# Patient Record
Sex: Female | Born: 1935 | Race: White | Hispanic: No | Marital: Married | Smoking: Former smoker
Health system: Southern US, Community
[De-identification: ages and names within clinical notes are randomized; demographics above are authoritative.]

## PROBLEM LIST (undated history)

## (undated) DIAGNOSIS — E079 Disorder of thyroid, unspecified: Secondary | ICD-10-CM

## (undated) DIAGNOSIS — M1990 Unspecified osteoarthritis, unspecified site: Secondary | ICD-10-CM

## (undated) DIAGNOSIS — D61818 Other pancytopenia: Secondary | ICD-10-CM

## (undated) DIAGNOSIS — C8590 Non-Hodgkin lymphoma, unspecified, unspecified site: Secondary | ICD-10-CM

## (undated) DIAGNOSIS — C73 Malignant neoplasm of thyroid gland: Secondary | ICD-10-CM

## (undated) DIAGNOSIS — E890 Postprocedural hypothyroidism: Secondary | ICD-10-CM

## (undated) DIAGNOSIS — I4891 Unspecified atrial fibrillation: Secondary | ICD-10-CM

## (undated) DIAGNOSIS — C189 Malignant neoplasm of colon, unspecified: Secondary | ICD-10-CM

## (undated) DIAGNOSIS — I10 Essential (primary) hypertension: Secondary | ICD-10-CM

## (undated) DIAGNOSIS — I609 Nontraumatic subarachnoid hemorrhage, unspecified: Secondary | ICD-10-CM

## (undated) DIAGNOSIS — I2699 Other pulmonary embolism without acute cor pulmonale: Secondary | ICD-10-CM

## (undated) DIAGNOSIS — K5790 Diverticulosis of intestine, part unspecified, without perforation or abscess without bleeding: Secondary | ICD-10-CM

## (undated) DIAGNOSIS — D649 Anemia, unspecified: Secondary | ICD-10-CM

## (undated) DIAGNOSIS — Z8679 Personal history of other diseases of the circulatory system: Secondary | ICD-10-CM

## (undated) DIAGNOSIS — I480 Paroxysmal atrial fibrillation: Secondary | ICD-10-CM

## (undated) DIAGNOSIS — C801 Malignant (primary) neoplasm, unspecified: Secondary | ICD-10-CM

## (undated) HISTORY — DX: Essential (primary) hypertension: I10

## (undated) HISTORY — DX: Disorder of thyroid, unspecified: E07.9

## (undated) HISTORY — DX: Postprocedural hypothyroidism: E89.0

## (undated) HISTORY — DX: Non-Hodgkin lymphoma, unspecified, unspecified site: C85.90

## (undated) HISTORY — DX: Nontraumatic subarachnoid hemorrhage, unspecified: I60.9

## (undated) HISTORY — DX: Paroxysmal atrial fibrillation: I48.0

## (undated) HISTORY — DX: Unspecified osteoarthritis, unspecified site: M19.90

## (undated) HISTORY — DX: Anemia, unspecified: D64.9

## (undated) HISTORY — DX: Malignant neoplasm of thyroid gland: C73

## (undated) HISTORY — PX: SUBDURAL HEMATOMA EVACUATION VIA CRANIOTOMY: SUR319

## (undated) HISTORY — DX: Diverticulosis of intestine, part unspecified, without perforation or abscess without bleeding: K57.90

## (undated) HISTORY — DX: Malignant (primary) neoplasm, unspecified: C80.1

## (undated) HISTORY — PX: TUBOPLASTY / TUBOTUBAL ANASTOMOSIS: SUR1392

---

## 1957-12-20 HISTORY — PX: APPENDECTOMY: SHX54

## 1994-12-20 DIAGNOSIS — I609 Nontraumatic subarachnoid hemorrhage, unspecified: Secondary | ICD-10-CM

## 1994-12-20 HISTORY — DX: Nontraumatic subarachnoid hemorrhage, unspecified: I60.9

## 1996-12-20 HISTORY — PX: KNEE ARTHROSCOPY: SUR90

## 1996-12-20 HISTORY — PX: ROTATOR CUFF REPAIR: SHX139

## 1997-12-20 HISTORY — PX: CHOLECYSTECTOMY: SHX55

## 2011-09-08 ENCOUNTER — Encounter: Payer: Self-pay | Admitting: Hematology and Oncology

## 2011-09-08 ENCOUNTER — Other Ambulatory Visit: Payer: Self-pay | Admitting: Hematology and Oncology

## 2011-09-08 ENCOUNTER — Encounter (HOSPITAL_BASED_OUTPATIENT_CLINIC_OR_DEPARTMENT_OTHER): Payer: Medicare Other | Admitting: Hematology and Oncology

## 2011-09-08 ENCOUNTER — Other Ambulatory Visit (HOSPITAL_COMMUNITY)
Admission: RE | Admit: 2011-09-08 | Discharge: 2011-09-08 | Disposition: A | Discharge status: Home / Self Care | Payer: Medicare Other | Source: Ambulatory Visit | Attending: Hematology and Oncology | Admitting: Hematology and Oncology

## 2011-09-08 DIAGNOSIS — C9110 Chronic lymphocytic leukemia of B-cell type not having achieved remission: Secondary | ICD-10-CM

## 2011-09-08 DIAGNOSIS — D7289 Other specified disorders of white blood cells: Secondary | ICD-10-CM

## 2011-09-08 DIAGNOSIS — D72819 Decreased white blood cell count, unspecified: Secondary | ICD-10-CM

## 2011-09-08 DIAGNOSIS — D689 Coagulation defect, unspecified: Secondary | ICD-10-CM

## 2011-09-08 LAB — CBC WITH DIFFERENTIAL/PLATELET
MCH: 32.4 pg (ref 25.1–34.0)
MCHC: 33.5 g/dL (ref 31.5–36.0)
RBC: 4.47 10*6/uL (ref 3.70–5.45)
RDW: 13.8 % (ref 11.2–14.5)

## 2011-09-08 LAB — MANUAL DIFFERENTIAL
ANC (CHCC manual diff): 5.2 10*3/uL (ref 1.5–6.5)
Basophil: 0 % (ref 0–2)
Blasts: 0 % (ref 0–0)
Metamyelocytes: 0 % (ref 0–0)
PLT EST: DECREASED
PROMYELO: 0 % (ref 0–0)

## 2011-09-08 LAB — IVY BLEEDING TIME: Bleeding Time: 6.5 Minutes (ref 2.0–8.0)

## 2011-09-08 LAB — PROTIME-INR
INR: 1 — ABNORMAL LOW (ref 2.00–3.50)
Protime: 12 Seconds (ref 10.6–13.4)

## 2011-09-08 LAB — LACTATE DEHYDROGENASE: LDH: 309 U/L — ABNORMAL HIGH (ref 94–250)

## 2011-09-10 LAB — PROTEIN ELECTROPHORESIS, SERUM, WITH REFLEX
Alpha-2-Globulin: 12 % — ABNORMAL HIGH (ref 7.1–11.8)
Beta 2: 4.8 % (ref 3.2–6.5)
Beta Globulin: 5.9 % (ref 4.7–7.2)
Total Protein, Serum Electrophoresis: 6.8 g/dL (ref 6.0–8.3)

## 2011-09-10 LAB — IGG, IGA, IGM
IgA: 42 mg/dL — ABNORMAL LOW (ref 69–380)
IgG (Immunoglobin G), Serum: 658 mg/dL — ABNORMAL LOW (ref 690–1700)
IgM, Serum: 14 mg/dL — ABNORMAL LOW (ref 52–322)

## 2011-09-10 LAB — APTT: aPTT: 32 seconds (ref 24–37)

## 2011-09-24 ENCOUNTER — Other Ambulatory Visit: Payer: Self-pay | Admitting: Hematology and Oncology

## 2011-09-24 ENCOUNTER — Encounter (HOSPITAL_BASED_OUTPATIENT_CLINIC_OR_DEPARTMENT_OTHER): Payer: Medicare Other | Admitting: Hematology and Oncology

## 2011-09-24 ENCOUNTER — Ambulatory Visit (HOSPITAL_COMMUNITY)
Admission: RE | Admit: 2011-09-24 | Discharge: 2011-09-24 | Disposition: A | Discharge status: Home / Self Care | Payer: Medicare Other | Source: Ambulatory Visit | Attending: Hematology and Oncology | Admitting: Hematology and Oncology

## 2011-09-24 DIAGNOSIS — Z9089 Acquired absence of other organs: Secondary | ICD-10-CM | POA: Insufficient documentation

## 2011-09-24 DIAGNOSIS — R599 Enlarged lymph nodes, unspecified: Secondary | ICD-10-CM | POA: Insufficient documentation

## 2011-09-24 DIAGNOSIS — K7689 Other specified diseases of liver: Secondary | ICD-10-CM | POA: Insufficient documentation

## 2011-09-24 DIAGNOSIS — K838 Other specified diseases of biliary tract: Secondary | ICD-10-CM | POA: Insufficient documentation

## 2011-09-24 DIAGNOSIS — C9110 Chronic lymphocytic leukemia of B-cell type not having achieved remission: Secondary | ICD-10-CM

## 2011-09-24 DIAGNOSIS — K5730 Diverticulosis of large intestine without perforation or abscess without bleeding: Secondary | ICD-10-CM | POA: Insufficient documentation

## 2011-09-24 DIAGNOSIS — R109 Unspecified abdominal pain: Secondary | ICD-10-CM | POA: Insufficient documentation

## 2011-09-24 DIAGNOSIS — R197 Diarrhea, unspecified: Secondary | ICD-10-CM | POA: Insufficient documentation

## 2011-09-24 DIAGNOSIS — M47814 Spondylosis without myelopathy or radiculopathy, thoracic region: Secondary | ICD-10-CM | POA: Insufficient documentation

## 2011-09-24 DIAGNOSIS — R161 Splenomegaly, not elsewhere classified: Secondary | ICD-10-CM | POA: Insufficient documentation

## 2011-09-24 DIAGNOSIS — E042 Nontoxic multinodular goiter: Secondary | ICD-10-CM | POA: Insufficient documentation

## 2011-09-24 DIAGNOSIS — M5030 Other cervical disc degeneration, unspecified cervical region: Secondary | ICD-10-CM | POA: Insufficient documentation

## 2011-09-24 DIAGNOSIS — K639 Disease of intestine, unspecified: Secondary | ICD-10-CM | POA: Insufficient documentation

## 2011-09-24 DIAGNOSIS — R911 Solitary pulmonary nodule: Secondary | ICD-10-CM | POA: Insufficient documentation

## 2011-09-24 DIAGNOSIS — M47812 Spondylosis without myelopathy or radiculopathy, cervical region: Secondary | ICD-10-CM | POA: Insufficient documentation

## 2011-09-24 LAB — CMP (CANCER CENTER ONLY)
Alkaline Phosphatase: 115 U/L — ABNORMAL HIGH (ref 26–84)
BUN, Bld: 21 mg/dL (ref 7–22)
Creat: 1 mg/dl (ref 0.6–1.2)
Glucose, Bld: 103 mg/dL (ref 73–118)
Total Bilirubin: 0.9 mg/dl (ref 0.20–1.60)

## 2011-09-24 MED ORDER — IOHEXOL 300 MG/ML  SOLN
150.0000 mL | Freq: Once | INTRAMUSCULAR | Status: AC | PRN
  Administered 2011-09-24: 150 mL via INTRAVENOUS

## 2011-09-28 ENCOUNTER — Encounter (HOSPITAL_BASED_OUTPATIENT_CLINIC_OR_DEPARTMENT_OTHER): Payer: Medicare Other | Admitting: Hematology and Oncology

## 2011-09-28 DIAGNOSIS — K639 Disease of intestine, unspecified: Secondary | ICD-10-CM

## 2011-09-28 DIAGNOSIS — D809 Immunodeficiency with predominantly antibody defects, unspecified: Secondary | ICD-10-CM

## 2011-09-28 DIAGNOSIS — E041 Nontoxic single thyroid nodule: Secondary | ICD-10-CM

## 2011-09-28 DIAGNOSIS — C8589 Other specified types of non-Hodgkin lymphoma, extranodal and solid organ sites: Secondary | ICD-10-CM

## 2011-09-30 ENCOUNTER — Other Ambulatory Visit: Payer: Self-pay | Admitting: Hematology and Oncology

## 2011-09-30 DIAGNOSIS — C8590 Non-Hodgkin lymphoma, unspecified, unspecified site: Secondary | ICD-10-CM

## 2011-10-05 HISTORY — PX: COLONOSCOPY: SHX174

## 2011-10-06 ENCOUNTER — Ambulatory Visit (HOSPITAL_COMMUNITY): Payer: Medicare Other

## 2011-10-06 ENCOUNTER — Other Ambulatory Visit: Payer: Self-pay | Admitting: Interventional Radiology

## 2011-10-06 ENCOUNTER — Other Ambulatory Visit: Payer: Self-pay | Admitting: Hematology and Oncology

## 2011-10-06 ENCOUNTER — Ambulatory Visit (HOSPITAL_COMMUNITY)
Admission: RE | Admit: 2011-10-06 | Discharge: 2011-10-06 | Disposition: A | Discharge status: Home / Self Care | Payer: Medicare Other | Source: Ambulatory Visit | Attending: Hematology and Oncology | Admitting: Hematology and Oncology

## 2011-10-06 DIAGNOSIS — C8589 Other specified types of non-Hodgkin lymphoma, extranodal and solid organ sites: Secondary | ICD-10-CM | POA: Insufficient documentation

## 2011-10-06 DIAGNOSIS — C8590 Non-Hodgkin lymphoma, unspecified, unspecified site: Secondary | ICD-10-CM

## 2011-10-06 LAB — CBC
HCT: 39.1 % (ref 36.0–46.0)
MCHC: 32.5 g/dL (ref 30.0–36.0)
MCV: 96.1 fL (ref 78.0–100.0)
RDW: 13.7 % (ref 11.5–15.5)

## 2011-10-06 LAB — APTT: aPTT: 32 seconds (ref 24–37)

## 2011-10-21 HISTORY — PX: COLON RESECTION: SHX5231

## 2011-11-04 ENCOUNTER — Other Ambulatory Visit: Payer: Self-pay | Admitting: *Deleted

## 2011-11-04 ENCOUNTER — Telehealth: Payer: Self-pay | Admitting: *Deleted

## 2011-11-04 ENCOUNTER — Telehealth: Payer: Self-pay | Admitting: Hematology and Oncology

## 2011-11-04 DIAGNOSIS — C9110 Chronic lymphocytic leukemia of B-cell type not having achieved remission: Secondary | ICD-10-CM

## 2011-11-04 DIAGNOSIS — C8590 Non-Hodgkin lymphoma, unspecified, unspecified site: Secondary | ICD-10-CM

## 2011-11-04 NOTE — Telephone Encounter (Signed)
Spoke with pt today and was informed that pt would like to talk to md to discuss any recommendations by md.   Pt stated she has had unrelievable itching from the whole torso -  Especially in back and bilateral axillary areas -   Happened about 2 weeks before surgery.    Pt  Did not ask her primary about the itching.    Pt thinks  The symptom related to  Her lymphoma.     Pt would like to talk to md about the itching.    Pt stated she would make decisions  After discussing with md. Pt's   Phone     [phone].

## 2011-11-04 NOTE — Telephone Encounter (Signed)
S/w pt re appt for 2/5 @ 2 pm.

## 2011-11-20 ENCOUNTER — Encounter: Payer: Self-pay | Admitting: *Deleted

## 2011-11-20 DIAGNOSIS — I2699 Other pulmonary embolism without acute cor pulmonale: Secondary | ICD-10-CM

## 2011-11-20 HISTORY — DX: Other pulmonary embolism without acute cor pulmonale: I26.99

## 2011-11-24 ENCOUNTER — Ambulatory Visit (HOSPITAL_BASED_OUTPATIENT_CLINIC_OR_DEPARTMENT_OTHER): Payer: Medicare Other | Admitting: Hematology and Oncology

## 2011-11-24 ENCOUNTER — Encounter (HOSPITAL_COMMUNITY): Payer: Self-pay | Admitting: Emergency Medicine

## 2011-11-24 ENCOUNTER — Inpatient Hospital Stay (HOSPITAL_COMMUNITY)
Admission: EM | Admit: 2011-11-24 | Discharge: 2011-11-26 | DRG: 176 | Disposition: A | Discharge status: Home / Self Care | Payer: Medicare Other | Attending: Internal Medicine | Admitting: Internal Medicine

## 2011-11-24 ENCOUNTER — Other Ambulatory Visit: Payer: Self-pay | Admitting: *Deleted

## 2011-11-24 ENCOUNTER — Emergency Department (HOSPITAL_COMMUNITY): Payer: Medicare Other

## 2011-11-24 ENCOUNTER — Ambulatory Visit (HOSPITAL_COMMUNITY)
Admission: RE | Admit: 2011-11-24 | Discharge: 2011-11-24 | Disposition: A | Discharge status: Home / Self Care | Payer: Medicare Other | Source: Ambulatory Visit | Attending: Hematology and Oncology | Admitting: Hematology and Oncology

## 2011-11-24 ENCOUNTER — Other Ambulatory Visit (HOSPITAL_BASED_OUTPATIENT_CLINIC_OR_DEPARTMENT_OTHER): Payer: Medicare Other | Admitting: Lab

## 2011-11-24 ENCOUNTER — Ambulatory Visit: Payer: Medicare Other

## 2011-11-24 ENCOUNTER — Other Ambulatory Visit: Payer: Self-pay

## 2011-11-24 ENCOUNTER — Other Ambulatory Visit: Payer: Self-pay | Admitting: Hematology and Oncology

## 2011-11-24 ENCOUNTER — Ambulatory Visit (HOSPITAL_COMMUNITY)
Admission: RE | Admit: 2011-11-24 | Discharge: 2011-11-24 | Payer: Medicare Other | Source: Ambulatory Visit | Attending: Hematology and Oncology | Admitting: Hematology and Oncology

## 2011-11-24 DIAGNOSIS — R0602 Shortness of breath: Secondary | ICD-10-CM

## 2011-11-24 DIAGNOSIS — C180 Malignant neoplasm of cecum: Secondary | ICD-10-CM

## 2011-11-24 DIAGNOSIS — I749 Embolism and thrombosis of unspecified artery: Secondary | ICD-10-CM

## 2011-11-24 DIAGNOSIS — R5381 Other malaise: Secondary | ICD-10-CM

## 2011-11-24 DIAGNOSIS — C8590 Non-Hodgkin lymphoma, unspecified, unspecified site: Secondary | ICD-10-CM | POA: Diagnosis present

## 2011-11-24 DIAGNOSIS — C189 Malignant neoplasm of colon, unspecified: Secondary | ICD-10-CM | POA: Diagnosis present

## 2011-11-24 DIAGNOSIS — Z9049 Acquired absence of other specified parts of digestive tract: Secondary | ICD-10-CM

## 2011-11-24 DIAGNOSIS — K5792 Diverticulitis of intestine, part unspecified, without perforation or abscess without bleeding: Secondary | ICD-10-CM | POA: Insufficient documentation

## 2011-11-24 DIAGNOSIS — I2699 Other pulmonary embolism without acute cor pulmonale: Principal | ICD-10-CM | POA: Diagnosis present

## 2011-11-24 DIAGNOSIS — R600 Localized edema: Secondary | ICD-10-CM

## 2011-11-24 DIAGNOSIS — C9110 Chronic lymphocytic leukemia of B-cell type not having achieved remission: Secondary | ICD-10-CM

## 2011-11-24 DIAGNOSIS — I8290 Acute embolism and thrombosis of unspecified vein: Secondary | ICD-10-CM

## 2011-11-24 DIAGNOSIS — M1990 Unspecified osteoarthritis, unspecified site: Secondary | ICD-10-CM | POA: Insufficient documentation

## 2011-11-24 DIAGNOSIS — I609 Nontraumatic subarachnoid hemorrhage, unspecified: Secondary | ICD-10-CM | POA: Diagnosis present

## 2011-11-24 DIAGNOSIS — Z8673 Personal history of transient ischemic attack (TIA), and cerebral infarction without residual deficits: Secondary | ICD-10-CM

## 2011-11-24 DIAGNOSIS — C8589 Other specified types of non-Hodgkin lymphoma, extranodal and solid organ sites: Secondary | ICD-10-CM

## 2011-11-24 DIAGNOSIS — R609 Edema, unspecified: Secondary | ICD-10-CM

## 2011-11-24 DIAGNOSIS — C8587 Other specified types of non-Hodgkin lymphoma, spleen: Secondary | ICD-10-CM | POA: Diagnosis present

## 2011-11-24 DIAGNOSIS — I10 Essential (primary) hypertension: Secondary | ICD-10-CM | POA: Diagnosis present

## 2011-11-24 LAB — MANUAL DIFFERENTIAL
ALC: 70.5 10*3/uL — ABNORMAL HIGH (ref 0.9–3.3)
EOS: 1 % (ref 0–7)
LYMPH: 89 % — ABNORMAL HIGH (ref 14–49)
MONO: 5 % (ref 0–14)
Metamyelocytes: 0 % (ref 0–0)
Other Cell: 0 % (ref 0–0)
PLT EST: DECREASED
Variant Lymph: 0 % (ref 0–0)

## 2011-11-24 LAB — COMPREHENSIVE METABOLIC PANEL
Alkaline Phosphatase: 137 U/L — ABNORMAL HIGH (ref 39–117)
CO2: 29 mEq/L (ref 19–32)
Creatinine, Ser: 1.1 mg/dL (ref 0.50–1.10)
Glucose, Bld: 127 mg/dL — ABNORMAL HIGH (ref 70–99)
Total Bilirubin: 0.7 mg/dL (ref 0.3–1.2)

## 2011-11-24 LAB — CBC WITH DIFFERENTIAL/PLATELET
RBC: 4.59 10*6/uL (ref 3.70–5.45)
RDW: 14.9 % — ABNORMAL HIGH (ref 11.2–14.5)
WBC: 79.2 10*3/uL (ref 3.9–10.3)

## 2011-11-24 MED ORDER — ACETAMINOPHEN 325 MG PO TABS: 650.0000 mg | ORAL_TABLET | Freq: Four times a day (QID) | ORAL | Status: DC | PRN

## 2011-11-24 MED ORDER — HYDROCODONE-ACETAMINOPHEN 5-325 MG PO TABS: 1.0000 | ORAL_TABLET | ORAL | Status: DC | PRN

## 2011-11-24 MED ORDER — CARVEDILOL 6.25 MG PO TABS
6.2500 mg | ORAL_TABLET | Freq: Two times a day (BID) | ORAL | Status: DC
  Administered 2011-11-25 – 2011-11-26 (×2): 6.25 mg via ORAL
  Filled 2011-11-24 (×6): qty 1

## 2011-11-24 MED ORDER — LOSARTAN POTASSIUM 50 MG PO TABS
100.0000 mg | ORAL_TABLET | Freq: Every day | ORAL | Status: DC
  Administered 2011-11-25 – 2011-11-26 (×2): 100 mg via ORAL
  Filled 2011-11-24 (×3): qty 2

## 2011-11-24 MED ORDER — ENOXAPARIN SODIUM 100 MG/ML ~~LOC~~ SOLN
100.0000 mg | SUBCUTANEOUS | Status: DC
  Filled 2011-11-24: qty 1

## 2011-11-24 MED ORDER — HEPARIN SOD (PORCINE) IN D5W 100 UNIT/ML IV SOLN
1300.0000 [IU]/h | INTRAVENOUS | Status: AC
  Administered 2011-11-24 – 2011-11-25 (×2): 1300 [IU]/h via INTRAVENOUS
  Filled 2011-11-24 (×2): qty 250

## 2011-11-24 MED ORDER — ALBUTEROL SULFATE (5 MG/ML) 0.5% IN NEBU: 2.5000 mg | INHALATION_SOLUTION | RESPIRATORY_TRACT | Status: DC | PRN

## 2011-11-24 MED ORDER — ACETAMINOPHEN 650 MG RE SUPP: 650.0000 mg | Freq: Four times a day (QID) | RECTAL | Status: DC | PRN

## 2011-11-24 MED ORDER — IOHEXOL 300 MG/ML  SOLN
100.0000 mL | Freq: Once | INTRAMUSCULAR | Status: AC | PRN
  Administered 2011-11-24: 100 mL via INTRAVENOUS

## 2011-11-24 MED ORDER — SODIUM CHLORIDE 0.9 % IV SOLN
INTRAVENOUS | Status: AC
  Administered 2011-11-24 – 2011-11-25 (×2): via INTRAVENOUS

## 2011-11-24 MED ORDER — HEPARIN BOLUS VIA INFUSION
2000.0000 [IU] | Freq: Once | INTRAVENOUS | Status: AC
  Administered 2011-11-24: 2000 [IU] via INTRAVENOUS
  Filled 2011-11-24: qty 2000

## 2011-11-24 NOTE — ED Notes (Signed)
Pt with recent Right colectomy (internal) 5 weeks ago; dx of splenic lymphoma for 2-3 years; Developed shortness of breath "I think it's been more than a week"; scan done today that shows PE. Denis pain and swelling in legs, denies sudden onset of shortness of breath; currently appears in no distress, denies pain

## 2011-11-24 NOTE — Progress Notes (Signed)
This office note has been dictated.

## 2011-11-24 NOTE — ED Provider Notes (Signed)
History     CSN: [id number] Arrival date & time: 11/24/2011  5:19 PM   First MD Initiated Contact with Patient 11/24/11 1733      Chief Complaint  Patient presents with  . Shortness of Breath    (Consider location/radiation/quality/duration/timing/severity/associated sxs/prior treatment) HPI Comments: With history of lymphoma and recent colectomy who presents with slowly worsening shortness of breath.  She denies any chest pain.  She had a followup appointment with her oncologist Dr. Dalene Carrow today who ordered a CT angiogram of her chest to rule out pulmonary embolus and patient was found to have bilateral pulmonary emboli on her scan.  Patient was directed to come to the emergency department for further evaluation.  Patient denies any lower extremity pain or swelling.  She is not currently on any anticoagulation or antiplatelet agents.  She does admit to an episode of a traumatic subdural hematoma a few years ago and a spontaneous subarachnoid hemorrhage but without aneurysm in the past.  Patient denies any headache or nausea or vomiting today.  Patient is a 76 y.o. female presenting with shortness of breath. The history is provided by the patient. No language interpreter was used.  Shortness of Breath  The current episode started more than 1 week ago. Associated symptoms include shortness of breath. Pertinent negatives include no chest pain, no fever and no cough.    Past Medical History  Diagnosis Date  . Hypertension   . Arthritis   . Subarachnoid hemorrhage 1996  . Diverticulitis   . Non Hodgkin's lymphoma     Non-Hodgkin's B-Cell Lymphoma    Past Surgical History  Procedure Date  . Appendectomy   . Cholecystectomy     S/P Lap Cholecystectomy  . Knee surgery     S/P  Arthroscopic Left knee surgery  . Tuboplasty / tubotubal anastomosis   . Subdural hematoma evacuation via craniotomy     Family History  Problem Relation Age of Onset  . Kidney cancer Sister   . Lung  cancer Brother     History  Substance Use Topics  . Smoking status: Not on file  . Smokeless tobacco: Not on file  . Alcohol Use:     OB History    Grav Para Term Preterm Abortions TAB SAB Ect Mult Living                  Review of Systems  Constitutional: Negative.  Negative for fever and chills.  HENT: Negative.   Eyes: Negative.  Negative for discharge and redness.  Respiratory: Positive for shortness of breath. Negative for cough.   Cardiovascular: Negative.  Negative for chest pain.  Gastrointestinal: Negative.  Negative for nausea, vomiting, abdominal pain and diarrhea.  Genitourinary: Negative.  Negative for dysuria and vaginal discharge.  Musculoskeletal: Negative.  Negative for back pain.  Skin: Negative.  Negative for color change and rash.  Neurological: Negative.  Negative for syncope and headaches.  Hematological: Negative.  Negative for adenopathy.  Psychiatric/Behavioral: Negative.  Negative for confusion.  All other systems reviewed and are negative.    Allergies  Review of patient's allergies indicates no known allergies.  Home Medications   Current Outpatient Rx  Name Route Sig Dispense Refill  . CARVEDILOL 6.25 MG PO TABS Oral Take 6.25 mg by mouth 2 (two) times daily with a meal.      . DIPHENHYDRAMINE HCL 25 MG PO TABS Oral Take 25 mg by mouth as needed.      Marland Kitchen LOSARTAN POTASSIUM 100  MG PO TABS Oral Take 100 mg by mouth daily.        BP 153/85  Pulse 80  Temp(Src) 98.6 F (37 C) (Oral)  Resp 5  Ht 5\' 8"  (1.727 m)  Wt 209 lb (94.802 kg)  BMI 31.78 kg/m2  SpO2 95%  Physical Exam  Constitutional: She is oriented to person, place, and time. She appears well-developed and well-nourished.  Non-toxic appearance. She does not have a sickly appearance.  HENT:  Head: Normocephalic and atraumatic.  Eyes: Conjunctivae, EOM and lids are normal. Pupils are equal, round, and reactive to light. No scleral icterus.  Neck: Trachea normal and normal range  of motion. Neck supple.  Cardiovascular: Normal rate, regular rhythm and normal heart sounds.   Pulmonary/Chest: Effort normal and breath sounds normal. No respiratory distress. She has no wheezes. She has no rales.  Abdominal: Soft. Normal appearance. There is no tenderness. There is no rebound, no guarding and no CVA tenderness.  Musculoskeletal: Normal range of motion.  Neurological: She is alert and oriented to person, place, and time. She has normal strength.  Skin: Skin is warm, dry and intact. No rash noted.  Psychiatric: She has a normal mood and affect. Her behavior is normal. Judgment and thought content normal.    ED Course  Procedures (including critical care time)   Labs Reviewed  APTT  PROTIME-INR   Ct Angio Chest W/cm &/or Wo Cm  11/24/2011  *RADIOLOGY REPORT*  Clinical Data:  Calf swelling, recent surgery  CT ANGIOGRAPHY CHEST WITH CONTRAST  Technique:  Multidetector CT imaging of the chest was performed using the standard protocol during bolus administration of intravenous contrast.  Multiplanar CT image reconstructions including MIPs were obtained to evaluate the vascular anatomy.  Contrast: OMNIPAQUE IOHEXOL 300 MG/ML IV SOLN  Comparison:  CT chest 09/24/2011  Findings:  There are tubular filling defects within the proximal segmental branches of the right lower lobe and left lower lobe. These findings are consistent with acute pulmonary embolism. Thromboembolism also present to a lesser degree within the right upper lobe and left upper lobe. The  overall clot burden is moderate.  No acute findings of the aorta and great vessels.  No pericardial fluid.  No mediastinal adenopathy.  Esophagus is normal.  No acute pulmonary parenchymal findings.  A subtle perfusion abnormality within the inferior right middle lobe (image 73) may relate to the pulmonary embolism.  Limited view of the upper abdomen demonstrates a large spleen.  Limited view of the skeleton is unremarkable.   Review of the MIP images confirms the above findings.  IMPRESSION:  1.  Acute pulmonary thromboembolism within the right and left lower lobes.  Smaller emboli are notes within the  upper lobes. Overall clot burden is moderate.  Critical results were conveyed to Dr. Dalene Carrow  on 11/24/2011 at 4:35 hours  Original Report Authenticated By: Genevive Bi, M.D.     No diagnosis found.    MDM  Spoke with Dr. Oren Section who agrees that the patient is safe for anticoagulation given that her history of head bleeds was either related to trauma or a spontaneous subarachnoid without any aneurysm at that time.  I've discussed this with Dr. Thedore Mins who is the admitting physician from [facility] who will be continuing to care for this patient.        Nat Christen, MD 11/24/11 [phone]

## 2011-11-24 NOTE — ED Notes (Signed)
ZOX:[id number]<AV> Expected date:11/24/11<BR> Expected time: 5:27 PM<BR> Means of arrival:<BR> Comments:<BR> hold

## 2011-11-24 NOTE — H&P (Signed)
Tracy Mclaughlin [phone]  Outpatient Primary MD for the patient is No primary provider on file.  With History of -    Past Medical History  Diagnosis Date  . Hypertension   . Arthritis   . Subarachnoid hemorrhage 1996  . Diverticulitis   . Non Hodgkin's lymphoma     Non-Hodgkin's B-Cell Lymphoma   Colon CA with recent Colectomy   Past Surgical History  Procedure Date  . Appendectomy   . Cholecystectomy     S/P Lap Cholecystectomy  . Knee surgery     S/P  Arthroscopic Left knee surgery  . Tuboplasty / tubotubal anastomosis   . Subdural hematoma evacuation via craniotomy     in for   Chief Complaint  Patient presents with  . Shortness of Breath     HPI  Tracy Mclaughlin QQV:[id number]:[id number] is a 77 y.o. female,  patient with history of Hodgkin's lymphoma sees Dr. Arnell Sieving who also recently had partial colectomy for colon cancer few weeks ago presented to her oncologist office for routine followup. In the office she complained of some shortness of breath after which a CT chest was ordered which came back positive for PE and I was called to admit the patient. At the time of my admission patient denies any shortness of breath at rest. She denies any chest chest pain whatsoever denies any recent travels or leg swelling.   Review of Systems    In addition to the HPI above,  No Fever-chills, No Headache, No changes with Vision or hearing, No problems swallowing food or Liquids, No Chest pain, Cough . No Abdominal pain, No Nausea or Vommitting, Bowel movements are regular, No Blood in stool or Urine, No dysuria, No new skin rashes or bruises, No new joints pains-aches,  No new weakness, tingling, numbness in any extremity, No recent weight gain or loss, No polyuria, polydypsia or polyphagia, No significant Mental Stressors.  A full 10 point Review of Systems was done, except as stated above, all other Review of Systems were negative.   Social  History History  Substance Use Topics  . Smoking status: Not on file  . Smokeless tobacco: Not on file  . Alcohol Use:       Family History Family History  Problem Relation Age of Onset  . Kidney cancer Sister   . Lung cancer Brother      Prior to Admission medications   Medication Sig Start Date End Date Taking? Authorizing Provider  carvedilol (COREG) 6.25 MG tablet Take 6.25 mg by mouth 2 (two) times daily with a meal.     Yes Historical Provider, MD  diphenhydrAMINE (BENADRYL) 25 MG tablet Take 25 mg by mouth as needed. itching   Yes Historical Provider, MD  losartan (COZAAR) 100 MG tablet Take 100 mg by mouth daily.     Yes Historical Provider, MD  meclizine (ANTIVERT) 25 MG tablet Take 25 mg by mouth 3 (three) times daily as needed. dizziness    Yes Historical Provider, MD    No Known Allergies  Physical Exam No intake or output data in the 24 hours ending 11/24/11 1841 1. Blood pressure 153/85, pulse 80, temperature 98.6 F (37 C), temperature source Oral, resp. rate 5, height 5\' 8"  (1.727 m), weight 94.802 kg (209 lb), SpO2 95.00%.  General obese elderly female lying in bed in NAD,     2. Normal affect and insight, Not Suicidal or Homicidal, Awake Alert, Oriented *3.  3. No F.N deficits, ALL C.Nerves Intact, Strength  5/5 all 4 extremities, Sensation intact all 4 extremities, Plantars down going.  4. Ears and Eyes appear Normal, Conjunctivae clear, PERRLA. Moist Oral Mucosa.  5. Supple Neck, No JVD, No cervical lymphadenopathy appriciated, No Carotid Bruits.  6. Symmetrical Chest wall movement, Good air movement bilaterally, CTAB.  7. RRR, No Gallops, Rubs or Murmurs, No Parasternal Heave.  8. Positive Bowel Sounds, Abdomen Soft, Non tender, No organomegaly appriciated,  No rebound -guarding or rigidity.  9. No Cyanosis, Normal Skin Turgor, No Skin Rash or Bruise.  10. Good muscle tone,  joints appear normal , no effusions, Normal ROM.  11. No Palpable Lymph  Nodes in Neck or Axillae      Data Review  CBC w Diff:  Lab Results  Component Value Date   WBC 79.2* 11/24/2011   WBC PENDING 10/06/2011   WBC 59.6* 10/06/2011   HGB 14.3 11/24/2011   HGB 12.7 10/06/2011   HGB 12.7 10/06/2011   HCT 43.8 11/24/2011   HCT 39.1 10/06/2011   HCT 39.1 10/06/2011   PLT 115* 11/24/2011   PLT PENDING 10/06/2011   PLT 104* 10/06/2011   EOSPCT 1 11/24/2011    CMP:  Lab Results  Component Value Date   NA 142 11/24/2011   NA 144 09/24/2011   K 4.0 11/24/2011   K 5.3* 09/24/2011   CL 104 11/24/2011   CL 100 09/24/2011   CO2 29 11/24/2011   CO2 28 09/24/2011   BUN 19 11/24/2011   BUN 21 09/24/2011   CREATININE 1.10 11/24/2011   CREATININE 1.0 09/24/2011   PROT 6.6 11/24/2011   PROT 6.5 09/24/2011   ALBUMIN 4.0 11/24/2011   BILITOT 0.7 11/24/2011   BILITOT 0.90 09/24/2011   ALKPHOS 137* 11/24/2011   ALKPHOS 115* 09/24/2011   AST 36 11/24/2011   AST 36 09/24/2011   ALT 25 11/24/2011    No results found for this basename: UACOL:2,UAPR:2,USPG:2,UPH:2,UTP:2,UGL:2,UKET:2,UBIL:2,UHGB:2,UNIT:2,UROB:2,ULEU:2,UEPI:2,UWBC:2,URBC:2,UBAC:2,CAST:2,CRYS:2,UCOM:2,BILUA:2 in the last 72 hours  Coagulation:  Lab Results  Component Value Date   PROTIME 12.0 09/08/2011   INR 1.02 10/06/2011   INR 1.00* 09/08/2011    My personal review of EKG: Rhythm NSR, Rate  70 /min, QTc 440 , no Acute ST changes    Assessment & Plan  1. moderate size PE in a patient with lymphoma and recent colon surgery. Plan is to admit the patient initiate heparin drip since patient has history of spontaneous intracerebral bleed in 1999 I have discussed this with Dr. Wynetta Emery neurosurgeon on call who agrees that at this point patient will benefit from anticoagulations treatment. Also the ER physician has discussed anti-coagulation with oncologist on call who also concurs with this plan. Patient will be placed on heparin drip with pharmacy to monitor I will lacer in a telemetry bed check echogram and lower  extremity Dopplers.   2. history of lymphoma patient follows with Dr. Parke Poisson. we will consult her if need be.  3. history of recent colon surgery due to adenocarcinoma of the colon outpatient followup with oncology and general surgery.  4. history of hypertension no acute issues we'll continue home medications.   DVT Prophylaxis   Heparin   AM Labs Ordered, also please review Full Orders Admission, patients condition and plan of care including tests being ordered have been discussed with the patient   who indicates understanding and agree with the plan and Code Status.  Code Status Full  Condition Fair The patient is being admitted to the Hospitalist Service.

## 2011-11-24 NOTE — ED Notes (Signed)
MD at bedside. 

## 2011-11-24 NOTE — Progress Notes (Signed)
ANTICOAGULATION CONSULT NOTE - Initial Consult  Pharmacy Consult for Heparin Indication: Bilateral Pulmonary Embolism  No Known Allergies  Patient Measurements: Height: 5\' 8"  (172.7 cm) Weight: 209 lb (94.802 kg) IBW/kg (Calculated) : 63.9    Vital Signs: Temp: 98.6 F (37 C) (12/05 1729) Temp src: Oral (12/05 1729) BP: 153/85 mmHg (12/05 1729) Pulse Rate: 80  (12/05 1729)  Labs:  Basename 11/24/11 1346  HGB 14.3  HCT 43.8  PLT 115*  APTT --  LABPROT --  INR --  HEPARINUNFRC --  CREATININE 1.10  CKTOTAL --  CKMB --  TROPONINI --   Estimated Creatinine Clearance: 53.2 ml/min (by C-G formula based on Cr of 1.1).  Medical History: Past Medical History  Diagnosis Date  . Hypertension   . Arthritis   . Subarachnoid hemorrhage 1996  . Diverticulitis   . Non Hodgkin's lymphoma     Non-Hodgkin's B-Cell Lymphoma    Medications:  Scheduled:    . DISCONTD: enoxaparin  100 mg Subcutaneous To ER   Infusions:    Assessment: 77 yr old female with complaint of worsening of SOB.  CT shows bilateral PE.  Baseline coags pending. Pt not on coumadin prior to admission.  IV heparin to begin.  Goal of Therapy:  Heparin level 0.3-0.7 units/ml   Plan:  1.  F/U baseline PTT, PT/INR 2.  Heparin 2000 unit IV bolus x 1 3.  Heparin drip @ 1300 units/hr 4.  Check heparin level 8 hr after heparin started. 5.  Check daily heparin level & CBC.  Shayleen Eppinger, Joselyn Glassman 11/24/2011,6:37 PM

## 2011-11-25 DIAGNOSIS — I2699 Other pulmonary embolism without acute cor pulmonale: Principal | ICD-10-CM

## 2011-11-25 DIAGNOSIS — C189 Malignant neoplasm of colon, unspecified: Secondary | ICD-10-CM

## 2011-11-25 DIAGNOSIS — C8589 Other specified types of non-Hodgkin lymphoma, extranodal and solid organ sites: Secondary | ICD-10-CM

## 2011-11-25 LAB — CBC
Hemoglobin: 12.3 g/dL (ref 12.0–15.0)
MCH: 30.2 pg (ref 26.0–34.0)
MCHC: 32.2 g/dL (ref 30.0–36.0)
MCV: 93.9 fL (ref 78.0–100.0)
Platelets: 118 10*3/uL — ABNORMAL LOW (ref 150–400)
RBC: 4.07 MIL/uL (ref 3.87–5.11)

## 2011-11-25 LAB — HOMOCYSTEINE: Homocysteine: 14.5 umol/L (ref 4.0–15.4)

## 2011-11-25 LAB — BASIC METABOLIC PANEL
BUN: 16 mg/dL (ref 6–23)
Calcium: 9.1 mg/dL (ref 8.4–10.5)
Creatinine, Ser: 1.02 mg/dL (ref 0.50–1.10)
GFR calc Af Amer: 61 mL/min — ABNORMAL LOW (ref >90)
GFR calc non Af Amer: 52 mL/min — ABNORMAL LOW (ref >90)

## 2011-11-25 LAB — LUPUS ANTICOAGULANT PANEL
DRVVT: 40.6 secs (ref 34.1–42.2)
PTTLA 4:1 Mix: 41.3 secs (ref 28.0–43.0)

## 2011-11-25 LAB — PROTEIN S, TOTAL: Protein S Ag, Total: 168 % — ABNORMAL HIGH (ref 60–150)

## 2011-11-25 LAB — HEPARIN LEVEL (UNFRACTIONATED): Heparin Unfractionated: 0.67 IU/mL (ref 0.30–0.70)

## 2011-11-25 MED ORDER — DIPHENHYDRAMINE HCL 25 MG PO CAPS
ORAL_CAPSULE | ORAL | Status: AC
  Filled 2011-11-25: qty 1

## 2011-11-25 MED ORDER — ENOXAPARIN SODIUM 150 MG/ML ~~LOC~~ SOLN
145.0000 mg | SUBCUTANEOUS | Status: DC
  Administered 2011-11-25 – 2011-11-26 (×2): 145 mg via SUBCUTANEOUS
  Filled 2011-11-25 (×2): qty 1

## 2011-11-25 MED ORDER — DIPHENHYDRAMINE HCL 25 MG PO CAPS
25.0000 mg | ORAL_CAPSULE | Freq: Once | ORAL | Status: AC
  Administered 2011-11-25: 25 mg via ORAL

## 2011-11-25 NOTE — Progress Notes (Signed)
Subjective: Patient says she's feeling better. She denies any chest pain. No dyspnea at rest but has not ambulated to know if she has any dyspnea on exertion. Denies any other complaints.  Objective: Blood pressure 157/75, pulse 56, temperature 97.9 F (36.6 C), temperature source Oral, resp. rate 18, height 5\' 8"  (1.727 m), weight 97 kg (213 lb 13.5 oz), SpO2 92.00%. No intake or output data in the 24 hours ending 11/25/11 1557 General exam: Patient was seen sitting up in bed having her lunch. She was in no obvious distress. Pleasant. Respiratory system: Clear. No increased work of breathing. Cardiovascular system: S1 and S2 heard, regular. No JVD Gastrointestinal system: Abdomen is nondistended, soft and normal bowel sounds heard. Central nervous system: Alert and oriented. No focal neurological deficits. Extremities: Asymmetrical mild left lower extremity diffuse swelling compared to the right which patient indicates is not new. No other acute signs.  Lab Results: Basic Metabolic Panel:  Basename 11/25/11 0422 11/24/11 1346  NA 141 142  K 3.5 4.0  CL 105 104  CO2 26 29  GLUCOSE 99 127*  BUN 16 19  CREATININE 1.02 1.10  CALCIUM 9.1 9.5  MG -- --  PHOS -- --   Liver Function Tests:  Basename 11/24/11 1346  AST 36  ALT 25  ALKPHOS 137*  BILITOT 0.7  PROT 6.6  ALBUMIN 4.0   No results found for this basename: LIPASE:2,AMYLASE:2 in the last 72 hours No results found for this basename: AMMONIA:2 in the last 72 hours CBC:  Basename 11/25/11 0422 11/24/11 1346  WBC 84.0* 79.2*  NEUTROABS -- --  HGB 12.3 14.3  HCT 38.2 43.8  MCV 93.9 95.3  PLT 118* 115*   Cardiac Enzymes: No results found for this basename: CKTOTAL:3,CKMB:3,CKMBINDEX:3,TROPONINI:3 in the last 72 hours BNP: No results found for this basename: POCBNP:3 in the last 72 hours D-Dimer: No results found for this basename: DDIMER:2 in the last 72 hours CBG: No results found for this basename: GLUCAP:6 in  the last 72 hours Hemoglobin A1C: No results found for this basename: HGBA1C in the last 72 hours Fasting Lipid Panel: No results found for this basename: CHOL,HDL,LDLCALC,TRIG,CHOLHDL,LDLDIRECT in the last 72 hours Thyroid Function Tests: No results found for this basename: TSH,T4TOTAL,FREET4,T3FREE,THYROIDAB in the last 72 hours Anemia Panel: No results found for this basename: VITAMINB12,FOLATE,FERRITIN,TIBC,IRON,RETICCTPCT in the last 72 hours Coagulation:  Basename 11/24/11 1748  LABPROT 13.8  INR 1.04   Urine Drug Screen: Drugs of Abuse  No results found for this basename: labopia, cocainscrnur, labbenz, amphetmu, thcu, labbarb    Alcohol Level: No results found for this basename: ETH:2 in the last 72 hours Urinalysis:  Misc. Labs:   Micro Results: No results found for this or any previous visit (from the past 240 hour(s)).  Studies/Results: Ct Head Wo Contrast  11/24/2011  *RADIOLOGY REPORT*  Clinical Data: History of lymphoma and subarachnoid hemorrhage. Evaluate for underlying abnormality prior to anticoagulation.  CT HEAD WITHOUT CONTRAST  Technique:  Contiguous axial images were obtained from the base of the skull through the vertex without contrast.  Comparison: Limited correlation is made with a neck CT 09/24/2011.  Findings: There is no evidence of acute intracranial hemorrhage, mass lesion, brain edema or extra-axial fluid collection.  The ventricles and subarachnoid spaces are appropriately sized for age. There is no CT evidence of acute cortical infarction.  The visualized paranasal sinuses are clear aside from a possible small osteoma in the left frontal sinus.  The mastoids and middle ears  are clear. There is a small well circumscribed cortical defect within the right frontal bone on images 24 and 25, possibly postsurgical.  The calvarium otherwise appears normal.  IMPRESSION:  1.  No acute intracranial findings.  No evidence of hemorrhage or brain edema. 2.  Small  well circumscribed cortical defect in the right frontal bone is nonspecific but possibly postsurgical.  This was not imaged on the prior neck CT.  Original Report Authenticated By: Gerrianne Scale, M.D.   Ct Angio Chest W/cm &/or Wo Cm  11/24/2011  *RADIOLOGY REPORT*  Clinical Data:  Calf swelling, recent surgery  CT ANGIOGRAPHY CHEST WITH CONTRAST  Technique:  Multidetector CT imaging of the chest was performed using the standard protocol during bolus administration of intravenous contrast.  Multiplanar CT image reconstructions including MIPs were obtained to evaluate the vascular anatomy.  Contrast: OMNIPAQUE IOHEXOL 300 MG/ML IV SOLN  Comparison:  CT chest 09/24/2011  Findings:  There are tubular filling defects within the proximal segmental branches of the right lower lobe and left lower lobe. These findings are consistent with acute pulmonary embolism. Thromboembolism also present to a lesser degree within the right upper lobe and left upper lobe. The  overall clot burden is moderate.  No acute findings of the aorta and great vessels.  No pericardial fluid.  No mediastinal adenopathy.  Esophagus is normal.  No acute pulmonary parenchymal findings.  A subtle perfusion abnormality within the inferior right middle lobe (image 73) may relate to the pulmonary embolism.  Limited view of the upper abdomen demonstrates a large spleen.  Limited view of the skeleton is unremarkable.  Review of the MIP images confirms the above findings.  IMPRESSION:  1.  Acute pulmonary thromboembolism within the right and left lower lobes.  Smaller emboli are notes within the  upper lobes. Overall clot burden is moderate.  Critical results were conveyed to Dr. Dalene Carrow  on 11/24/2011 at 4:35 hours  Original Report Authenticated By: Genevive Bi, M.D.    Medications: Scheduled Meds:   . carvedilol  6.25 mg Oral BID WC  . diphenhydrAMINE      . diphenhydrAMINE  25 mg Oral Once  . enoxaparin (LOVENOX) injection  145 mg  Subcutaneous Q24H  . heparin  2,000 Units Intravenous Once  . losartan  100 mg Oral Daily  . DISCONTD: enoxaparin  100 mg Subcutaneous To ER   Continuous Infusions:   . sodium chloride 75 mL/hr at 11/25/11 1007  . heparin 1,300 Units/hr (11/25/11 1054)   PRN Meds:.acetaminophen, acetaminophen, albuterol, HYDROcodone-acetaminophen  Assessment/Plan: Patient Active Hospital Problem List: 1. Pulmonary embolism (11/24/2011): Moderate clot burden    Assessment: Hemodynamically stable.    Plan: As per oncologist recommendation, continue full dose Lovenox for 5 days and then initiate Coumadin. Lovenox can be continued until the INR is therapeutic and then discontinued. We'll obtain bilateral lower extremity venous Dopplers. Monitor on telemetry.  2. 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 Subarachnoid hemorrhage    Assessment: No hemorrhage seen on current CT   Plan: Her case was discussed with the neurosurgeon on call last night who agreed with continuing anticoagulation  4. Hypertension ()   Assessment: Stable   Plan: Continue home medication   5. Colon cancer (11/24/2011)   Assessment: Per oncology   Plan: Per oncology    Hally Colella 11/25/2011, 3:57 PM

## 2011-11-25 NOTE — Consult Note (Signed)
Tracy Mclaughlin [id number] 1934-08-28 77 y.o. 11/25/2011 8:23 AM   Referring Physician: [facility].  Reason For Consult: Lymphoma and pulmonary  Embolism.  CC: Tracy Quay, MD. 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 [T3, N0]. She had also noted left lower calf swelling. She denied chest pain. She denied personal or family history of thrombosis. She was referred for an emergent CT angiogram of the chest. Results revealed acute pulmonary thromboembolism within the right and left lower lobes. His mother emboli were noted within the upper lobes. The clot burden was described as moderate. The patient was referred to the emergency room for admission and treatment. Prior to anticoagulation she received a head CT that was negative for bleed. Hypercoagulable blood work was also drawn. Pharmacy was consulted and the patient was placed on IV heparin per protocol.  The patient has no current complaints. She notes some improvement specifically with shortness of breath. She has no evidence of bleeding.  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 Status post tubal ligation.  Allergies:  No Known Allergies  Medications:  Scheduled:   . carvedilol  6.25 mg Oral BID WC  . diphenhydrAMINE      . diphenhydrAMINE  25 mg Oral Once  . heparin  2,000 Units Intravenous  Once  . losartan  100 mg Oral Daily  . DISCONTD: enoxaparin  100 mg Subcutaneous To ER   Continuous:   . sodium chloride 75 mL/hr at 11/24/11 2049  . heparin 1,300 Units/hr (11/24/11 2048)    Social History:  Patient is married and lives in [place]. She quit smoking 40 years ago. She denies alcohol use. She is a retired Designer, jewellery.  Family History: Patient had lung cancer. Sister had renal cancer. No family history for thrombosis.   Health Maintenance:  Receives health care through Dr. Theodosia Mclaughlin in [place].  Review of Systems:  Constitutional ROS: Denies fever chills night sweats anorexia or weight loss. Cardiovascular ROS: Denies chest pain, PND, orthopnea, admits to left calf discomfort. Respiratory ROS: Denies cough, hemoptysis, wheeze. Neurological ROS: Denies headaches, extremity weakness Dermatological ROS: Admits to pruritus but denies rash. ENT ROS: Negative Gastrointestinal ROS: Negative Genito-Urinary ROS: Denies hematuria, nocturia, frequency Remaining ROS negative.   Physical Exam: Patient is alert and oriented x3. Vital to pulse 71, blood pressure 151/56, temperature 96.4, respirations 20, fats 96% for men, weight 209 pounds. HEENT: Head is atraumatic and normocephalic. Extraocular muscles are intact. Sclera are anicteric. Mouth no thrush. Neck supple. Chest is clear with a few scattered rhonchi in both bases. Abdomen soft nontender. No masses. No hepatomegaly. Bowel sounds present. Left lower extremity with calf discomfort. Pulses present and symmetrical. Lymph nodes no palpable adenopathy. CNS nonfocal.   LABS:  CBC    Component Value Date/Time   WBC 84.0* 11/25/2011 0422   WBC 79.2* 11/24/2011 1346   RBC 4.07 11/25/2011 0422   RBC 4.59 11/24/2011 1346   HGB 12.3 11/25/2011 0422   HGB 14.3 11/24/2011 1346  HCT 38.2 11/25/2011 0422   HCT 43.8 11/24/2011 1346   PLT 118* 11/25/2011 0422   PLT 115* 11/24/2011 1346   MCV  93.9 11/25/2011 0422   MCV 95.3 11/24/2011 1346   MCH 30.2 11/25/2011 0422   MCH 31.2 11/24/2011 1346   MCHC 32.2 11/25/2011 0422   MCHC 32.7 11/24/2011 1346   RDW 14.7 11/25/2011 0422   RDW 14.9* 11/24/2011 1346     Basename 11/25/11 0422 11/24/11 1346  NA 141 142  K 3.5 4.0  CL 105 104  CO2 26 29  GLUCOSE 99 127*  BUN 16 19  CREATININE 1.02 1.10  CALCIUM 9.1 9.5     XRAYS/RESULTS: See above. Doppler ultrasound of lower extremity pending.    ASSESSMENT and PLAN: #1. Bilateral pulmonary embolism - patient is on anticoagulation with IV heparin. Vitals stable. We'll have pharmacy begin Lovenox dosed at 1.5 mg/KG daily an hour prior to discontinuation of IV heparin. Recommend that patient begins Coumadin 5 days into heparin. Aim to achieve an INR between 2 and 3. Hypercoagulable workup pending. #2 status post colectomy for colon cancer. Undergoing surveillance. #3. 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 Undergoing surveillance.   Arlan Organ I., MD 11/25/2011

## 2011-11-25 NOTE — ED Notes (Signed)
Tracy Mclaughlin [phone] this is pts PCP that could provide a copy of her 2D ECHO; because pt states that she has had one within the year and refuses to take another one at this time because it was not explained to her the indications for the test.

## 2011-11-25 NOTE — Progress Notes (Signed)
Patient states she heard Dr. Dalene Carrow say the lower extremity venous duplex was not needed.  She is refusing the test unless Dr. Dalene Carrow places the order.  Please call [facility] pager [phone] if the test is reordered by Dr. Dalene Carrow.  Thank you.    Thereasa Parkin, RVT 11/25/2011 8:50 AM

## 2011-11-25 NOTE — Progress Notes (Signed)
ANTICOAGULATION CONSULT NOTE - Follow Up Consult  Pharmacy Consult for Heparin Indication: bilateral PE  No Known Allergies  Patient Measurements: Height: 5\' 8"  (172.7 cm) Weight: 209 lb (94.802 kg) IBW/kg (Calculated) : 63.9    Vital Signs: Temp: 99.2 F (37.3 C) (12/06 0231) Temp src: Oral (12/06 0231) BP: 148/50 mmHg (12/06 0231) Pulse Rate: 66  (12/06 0231)  Labs:  Basename 11/25/11 0422 11/24/11 1748 11/24/11 1346  HGB 12.3 -- 14.3  HCT 38.2 -- 43.8  PLT 118* -- 115*  APTT -- 36 --  LABPROT -- 13.8 --  INR -- 1.04 --  HEPARINUNFRC 0.67 -- --  CREATININE 1.02 -- 1.10  CKTOTAL -- -- --  CKMB -- -- --  TROPONINI -- -- --   Estimated Creatinine Clearance: 57.4 ml/min (by C-G formula based on Cr of 1.02).   Medications:  Scheduled:    . carvedilol  6.25 mg Oral BID WC  . diphenhydrAMINE      . diphenhydrAMINE  25 mg Oral Once  . heparin  2,000 Units Intravenous Once  . losartan  100 mg Oral Daily  . DISCONTD: enoxaparin  100 mg Subcutaneous To ER   Infusions:    . sodium chloride 75 mL/hr at 11/24/11 2049  . heparin 1,300 Units/hr (11/24/11 2048)    Assessment: HL = 0.67 units/ml after  2000 unit bolus and drip @ 1300 units/hr x 8 hrs. No problems reported per RN.  Goal of Therapy:  Heparin level 0.3-0.7 units/ml   Plan:  Continue heparin @ 1300 units/hr. Recheck HL @ 10am today.  Susanne Greenhouse R 11/25/2011,7:15 AM

## 2011-11-25 NOTE — Progress Notes (Signed)
CC:   Tracy Mclaughlin,  [place], [place]       [name], MD  IDENTIFYING STATEMENT:  The patient is a 77 year old woman with B-cell lymphoma and also recent diagnosis of colon cancer who presents for followup.  INTERVAL HISTORY:  To summarize, Tracy Mclaughlin was evaluated for persistently elevated white blood cell counts.  She was referred to Hematology for further evaluation.  Extensive evaluation favored a B- cell lymphoproliferative process such as peripheralized lymphoma. Immunohistochemical markers were in keeping with a splenic marginal zone lymphoma or low-grade lymphoma involving the spleen.  The patient had received a bone marrow biopsy with aspiration that showed bone marrow involvement.  A CT scan of the neck, chest, abdomen, and pelvis on 09/24/2011 had shown mild adenopathy within the chest.  There were small retroperitoneal lymph node.  Splenomegaly was noted with mild splenic heterogenicity suggesting indistinct splenic masses.  A 2-cm filling defect was noted in the cecum.  This prompted a colonoscopy in on 10/05/2011 that revealed an adenocarcinoma in the cecum.  On 10/19/2011 she underwent an exploratory laparotomy with a right colon resection with anastomosis by Dr. [name] in [place], [place].  She was found to have a 3 x 3.5 cm mucinous moderately differentiated adenocarcinoma of the cecum.  There was diffuse small lymphocytic infiltrate in the serosa of the cecum and in the mesenteric lymph nodes. None of the 16 lymph nodes sampled had evidence of malignancy.  Tracy Mclaughlin notes she has healed well from surgery.  She however describes or has noted increasing shortness of breath and left lower extremity calf discomfort.  She has generalized fatigue.  She denies fever, chills, or night sweats.  She has not noted any adenopathy.  MEDICATIONS:  Reviewed and updated.  ALLERGIES:  None.  REVIEW OF SYSTEMS:  As above.  Denies chest pain.  Admits shortness  of breath.  Denies hemoptysis, wheeze.  Admits to generalized fatigue. Denies pain.  Denies nausea, vomiting, diarrhea, melena, hematochezia. Rest of review of systems negative.  PHYSICAL EXAM:  General:  Patient is alert, oriented x3.  Vitals:  Pulse 71, blood pressure 122/78, temp 97.1, respirations 20, weight 209 pounds.  HEENT:  Head is atraumatic, normocephalic.  Sclerae anicteric. Mouth moist.  Neck:  Supple.  Chest:  Clear to percussion and auscultation.  CVS:  First and second heart sounds present.  No added sounds or murmurs.  Abdomen:  Soft, nontender.  Bowel sounds present. Extremities:  Trace edema bilaterally.  Slight calf tenderness in the right lower extremity.  Pulses present and symmetrical.  Lymph nodes: No adenopathy.  CNS:  Nonfocal.  LAB DATA:  On 11/24/2011 white cell count is 9.2 (4.6), hemoglobin 14.3, hematocrit 43.8, platelets 115.  C-MET, LDH and CEA pending.  IMPRESSION AND PLAN:  Tracy Mclaughlin is a 77 year old woman with: 1. 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  She is currently     undergoing and will continue surveillance. 2. Status post exploratory laparotomy with right colectomy on     10/19/2011 in [place], [place] for a T3 N0 M0 moderately     differentiated mucinous adenocarcinoma of the cecum with none of 16     nodes being positive for tumor.  As to this regard, the patient     will undergo surveillance.  She requires a colonoscopy at year of     her diagnosis. 3. Recent-onset shortness of breath.  Need to rule out pulmonary  embolism and DVT.  The patient has recently undergone surgery.  The     patient will proceed for stat CT angiogram and lower extremity     Doppler.  The patient was advised that if results were positive,     would advise admission here to [facility] under hospitalist     service.  Would also recommend that she obtain a CT scan of the     head  before anticoagulation.  She has had a history of a     subarachnoid hemorrhage, although does not have any CNS complaints.     She is agreeable to the plan.    ______________________________ Laurice Record, M.D. LIO/MEDQ  D:  11/24/2011  T:  11/24/2011  Job:  [id number]

## 2011-11-25 NOTE — ED Notes (Signed)
Pt given meal tray.

## 2011-11-25 NOTE — Progress Notes (Signed)
ANTICOAGULATION CONSULT NOTE - Initial Consult  Pharmacy Consult for Lovenox Indication: PE  No Known Allergies  Patient Measurements: Height: 5\' 8"  (172.7 cm) Weight: 209 lb (94.802 kg) IBW/kg (Calculated) : 63.9    Vital Signs: Temp: 96.4 F (35.8 C) (12/06 0724) Temp src: Oral (12/06 0724) BP: 151/56 mmHg (12/06 0724) Pulse Rate: 71  (12/06 0724)  Labs:  Basename 11/25/11 0422 11/24/11 1748 11/24/11 1346  HGB 12.3 -- 14.3  HCT 38.2 -- 43.8  PLT 118* -- 115*  APTT -- 36 --  LABPROT -- 13.8 --  INR -- 1.04 --  HEPARINUNFRC 0.67 -- --  CREATININE 1.02 -- 1.10  CKTOTAL -- -- --  CKMB -- -- --  TROPONINI -- -- --   Estimated Creatinine Clearance: 57.4 ml/min (by C-G formula based on Cr of 1.02).  Medical History: Past Medical History  Diagnosis Date  . Hypertension   . Arthritis   . Subarachnoid hemorrhage 1996  . Diverticulitis   . Non Hodgkin's lymphoma     Non-Hodgkin's B-Cell Lymphoma    Medications:  Scheduled:    . carvedilol  6.25 mg Oral BID WC  . diphenhydrAMINE      . diphenhydrAMINE  25 mg Oral Once  . enoxaparin (LOVENOX) injection  145 mg Subcutaneous Q24H  . heparin  2,000 Units Intravenous Once  . losartan  100 mg Oral Daily  . DISCONTD: enoxaparin  100 mg Subcutaneous To ER    Assessment: 77YOF started on IV heparin infusion on admission for PE.  Patient has a history of lymphoma so therapy was changed to Lovenox 1.5mg /kg q24h per pharmacy dosing. Patient's CrCl>30 ml/min so dose is appropriate. Hgb 12.3.  Guidelines recommend to continue Lovenox for first 3-6 monhts of long-term anticoagulant therapy in patients with DVT + cancer.    Plan:  Lovenox 145mg  SQ q24h. Per MD instructions, will discontinue IV heparin infusion 1 hour following first Lovenox dose. Discontinued schedule heparin levels. Will continue to monitor renal function and CBC.  Clance Boll 11/25/2011,9:18 AM

## 2011-11-25 NOTE — ED Notes (Signed)
Pt given a pitcher of water

## 2011-11-26 ENCOUNTER — Other Ambulatory Visit: Payer: Self-pay | Admitting: Hematology and Oncology

## 2011-11-26 DIAGNOSIS — I2699 Other pulmonary embolism without acute cor pulmonale: Secondary | ICD-10-CM

## 2011-11-26 DIAGNOSIS — C189 Malignant neoplasm of colon, unspecified: Secondary | ICD-10-CM

## 2011-11-26 DIAGNOSIS — C8590 Non-Hodgkin lymphoma, unspecified, unspecified site: Secondary | ICD-10-CM

## 2011-11-26 LAB — PROTEIN S ACTIVITY: Protein S Activity: 75 % (ref 69–129)

## 2011-11-26 LAB — CBC
Hemoglobin: 12 g/dL (ref 12.0–15.0)
MCH: 29.9 pg (ref 26.0–34.0)
MCHC: 31.8 g/dL (ref 30.0–36.0)
MCV: 94 fL (ref 78.0–100.0)
RBC: 4.01 MIL/uL (ref 3.87–5.11)

## 2011-11-26 LAB — ANTITHROMBIN III: AntiThromb III Func: 85 % (ref 76–126)

## 2011-11-26 MED ORDER — ENOXAPARIN SODIUM 150 MG/ML ~~LOC~~ SOLN: 145.0000 mg | SUBCUTANEOUS | Status: DC

## 2011-11-26 MED ORDER — ENOXAPARIN SODIUM 150 MG/ML ~~LOC~~ SOLN: 145.0000 mg | SUBCUTANEOUS | Status: AC

## 2011-11-26 MED ORDER — WARFARIN SODIUM 5 MG PO TABS: 5.0000 mg | ORAL_TABLET | Freq: Every day | ORAL | Status: DC

## 2011-11-26 MED ORDER — ENOXAPARIN (LOVENOX) PATIENT EDUCATION KIT
PACK | Freq: Once | Status: AC
  Administered 2011-11-26: 14:00:00
  Filled 2011-11-26: qty 1

## 2011-11-26 MED ORDER — ENOXAPARIN (LOVENOX) PATIENT EDUCATION KIT: 1.0000 | PACK | Freq: Once | Status: DC

## 2011-11-26 NOTE — Progress Notes (Signed)
Pt ambulated <40 feet and tolerated it well. O2 sats were 93% while ambulating and 95% at rest on room air. Pt showed no signs of dyspnea.    Tracy Mclaughlin  11/26/2011 12:03 PM

## 2011-11-26 NOTE — Progress Notes (Signed)
Pt discharged home. No changes in condition since am assessment. Pt verbalized understanding of all d/c instructions and meds. Home lovenox teaching kit was given. Pt demonstrated the use of lovenox and handouts/pt education materials were given.   William Dalton Lajoi 11/26/2011 1250

## 2011-11-26 NOTE — Discharge Summary (Signed)
Physician Discharge Summary  Patient ID: Tracy Mclaughlin MRN: [id number] DOB/AGE: 09/03/1934 77 y.o.  Admit date: 11/24/2011 Discharge date: 11/26/2011  Primary Care Physician:  Nedra Hai and Theodosia Quay, MD. Fax [phone] Oncologist: Dr. Arlan Organ  Discharge Diagnoses:    Present on Admission:  .Colon cancer .Non Hodgkin's lymphoma .Hypertension .Pulmonary embolism .Subarachnoid hemorrhage  Discharge Medications: Current Discharge Medication List    START taking these medications   Details  enoxaparin (LOVENOX) 150 MG/ML injection Inject 0.97 mLs (145 mg total) into the skin daily. Qty: 15 mL, Refills: 0    enoxaparin (LOVENOX) KIT 1 kit by Does not apply route once.    warfarin (COUMADIN) 5 MG tablet Take 1 tablet (5 mg total) by mouth daily. Qty: 30 tablet, Refills: 6      CONTINUE these medications which have NOT CHANGED   Details  carvedilol (COREG) 6.25 MG tablet Take 6.25 mg by mouth 2 (two) times daily with a meal.      diphenhydrAMINE (BENADRYL) 25 MG tablet Take 25 mg by mouth as needed. itching    losartan (COZAAR) 100 MG tablet Take 100 mg by mouth daily.      meclizine (ANTIVERT) 25 MG tablet Take 25 mg by mouth 3 (three) times daily as needed. dizziness           Disposition and Follow-up: The patient is being discharged home and she has been  Instructed to followup with her primary care physician on Monday. 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  Consults:  1.  Dr. Arlan Organ, oncology  Significant Diagnostic Studies:  Ct Head Wo Contrast  11/24/2011  *RADIOLOGY REPORT*  Clinical Data: History of lymphoma and subarachnoid hemorrhage. Evaluate for underlying abnormality prior to anticoagulation.  CT HEAD WITHOUT CONTRAST  Technique:  Contiguous axial images were obtained from the base of the skull through the vertex without contrast.  Comparison: Limited correlation is made with a neck CT 09/24/2011.   Findings: There is no evidence of acute intracranial hemorrhage, mass lesion, brain edema or extra-axial fluid collection.  The ventricles and subarachnoid spaces are appropriately sized for age. There is no CT evidence of acute cortical infarction.  The visualized paranasal sinuses are clear aside from a possible small osteoma in the left frontal sinus.  The mastoids and middle ears are clear. There is a small well circumscribed cortical defect within the right frontal bone on images 24 and 25, possibly postsurgical.  The calvarium otherwise appears normal.  IMPRESSION:  1.  No acute intracranial findings.  No evidence of hemorrhage or brain edema. 2.  Small well circumscribed cortical defect in the right frontal bone is nonspecific but possibly postsurgical.  This was not imaged on the prior neck CT.  Original Report Authenticated By: Gerrianne Scale, M.D.   Ct Angio Chest W/cm &/or Wo Cm  11/24/2011  *RADIOLOGY REPORT*  Clinical Data:  Calf swelling, recent surgery  CT ANGIOGRAPHY CHEST WITH CONTRAST  Technique:  Multidetector CT imaging of the chest was performed using the standard protocol during bolus administration of intravenous contrast.  Multiplanar CT image reconstructions including MIPs were obtained to evaluate the vascular anatomy.  Contrast: OMNIPAQUE IOHEXOL 300 MG/ML IV SOLN  Comparison:  CT chest 09/24/2011  Findings:  There are tubular filling defects within the proximal segmental branches of the right lower lobe and left lower lobe. These findings are consistent with acute pulmonary embolism. Thromboembolism also present to a lesser degree within the right  upper lobe and left upper lobe. The  overall clot burden is moderate.  No acute findings of the aorta and great vessels.  No pericardial fluid.  No mediastinal adenopathy.  Esophagus is normal.  No acute pulmonary parenchymal findings.  A subtle perfusion abnormality within the inferior right middle lobe (image 73) may relate to the  pulmonary embolism.  Limited view of the upper abdomen demonstrates a large spleen.  Limited view of the skeleton is unremarkable.  Review of the MIP images confirms the above findings.  IMPRESSION:  1.  Acute pulmonary thromboembolism within the right and left lower lobes.  Smaller emboli are notes within the  upper lobes. Overall clot burden is moderate.  Critical results were conveyed to Dr. Dalene Carrow  on 11/24/2011 at 4:35 hours  Original Report Authenticated By: Genevive Bi, M.D.    Discharge Laboratory Values: Basic Metabolic Panel:  Lab 11/25/11 1610 11/24/11 1346  NA 141 142  K 3.5 4.0  CL 105 104  CO2 26 29  GLUCOSE 99 127*  BUN 16 19  CREATININE 1.02 1.10  CALCIUM 9.1 9.5  MG -- --  PHOS -- --   GFR Estimated Creatinine Clearance: 58 ml/min (by C-G formula based on Cr of 1.02). Liver Function Tests:  Lab 11/24/11 1346  AST 36  ALT 25  ALKPHOS 137*  BILITOT 0.7  PROT 6.6  ALBUMIN 4.0   Coagulation profile  Lab 11/24/11 1748  INR 1.04  PROTIME --    CBC:  Lab 11/26/11 0519 11/25/11 0422 11/24/11 1346  WBC 56.0* 84.0* 79.2*  NEUTROABS -- -- --  HGB 12.0 12.3 14.3  HCT 37.7 38.2 43.8  MCV 94.0 93.9 95.3  PLT 107* 118* 115*     Brief H and P: For complete details please refer to admission H and P, but in brief, [name] is a 77 -year-old female with a past medical history of colon cancer status post colectomy as well as non-Hodgkin's lymphoma who presented to the hospital with a chief complaint of dyspnea, and 2, upon workup, was found to have an acute pulmonary embolism.   Physical Exam at Discharge: BP 142/79  Pulse 57  Temp(Src) 98 F (36.7 C) (Oral)  Resp 18  Ht 5\' 8"  (1.727 m)  Wt 97 kg (213 lb 13.5 oz)  BMI 32.52 kg/m2  SpO2 92% Gen:  NAD Cardiovascular:  RRR, No M/R/G Respiratory: Lungs CTAB Gastrointestinal: Abdomen soft, NT/ND with normal active bowel sounds. Extremities: No C/E/C  Hospital Course:  Principal Problem:  *Pulmonary  embolism The patient was admitted and put on IV heparin. She was seen by her oncologist and switched over to subcutaneous Lovenox. Dr. Dalene Carrow who has recommended that she be discharged on subcutaneous Lovenox with Coumadin initiation 5 days from today and discontinuation of Lovenox once her INR is therapeutic x48 hours. Patient will followup with her PCP for Coumadin monitoring. Active Problems:  Non Hodgkin's lymphoma Stable during the course of his hospital stay. She will followup with her oncologist.  Subarachnoid hemorrhage The patient was initially put on IV heparin for it easy reversal given her history of subarachnoid hemorrhage. She has been transitioned over to subcutaneous Lovenox at the discretion of her oncologist. 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  Hypertension Patient's blood pressure is currently well controlled on Cozaar and Coreg.  Colon cancer The patient is status post colectomy and followup with her oncologist  post discharge.   Recommendations for hospital follow-up: 1.  Check PT/INR on 12/02/11 and daily thereafter until INR therapeutic. 2.  Adjust coumadin to achieve INR 2-3.  Time spent on Discharge: 50 minutes.  Signed: Dr. Trula Ore Lilliann Rossetti Pager [phone] 11/26/2011, 12:17 PM

## 2011-11-26 NOTE — Progress Notes (Addendum)
  Echocardiogram 2D Echocardiogram has been performed.  Cathie Beams Deneen 11/26/2011, 9:54 AM

## 2011-11-29 LAB — CARDIOLIPIN ANTIBODIES, IGG, IGM, IGA
Anticardiolipin IgA: 5 APL U/mL — ABNORMAL LOW (ref <22)
Anticardiolipin IgG: 4 GPL U/mL — ABNORMAL LOW (ref <23)
Anticardiolipin IgM: 22 MPL U/mL — ABNORMAL HIGH (ref <11)

## 2011-11-29 LAB — BETA-2-GLYCOPROTEIN I ABS, IGG/M/A: Beta-2-Glycoprotein I IgA: 39 A Units — ABNORMAL HIGH (ref <20)

## 2011-12-18 ENCOUNTER — Telehealth: Payer: Self-pay | Admitting: Hematology and Oncology

## 2011-12-18 NOTE — Telephone Encounter (Signed)
Per mosaiq 01/05/12 appts were needed but pt came in on 11/24/11 for an appt and her  F/u now is for 3/1 lab and 3/5 visit.  appt cx from mosaiq   aom

## 2012-02-18 ENCOUNTER — Ambulatory Visit (HOSPITAL_COMMUNITY)
Admit: 2012-02-18 | Discharge: 2012-02-18 | Disposition: A | Discharge status: Home / Self Care | Payer: Medicare Other | Attending: Hematology and Oncology | Admitting: Hematology and Oncology

## 2012-02-18 ENCOUNTER — Other Ambulatory Visit (HOSPITAL_COMMUNITY): Payer: PRIVATE HEALTH INSURANCE

## 2012-02-18 ENCOUNTER — Other Ambulatory Visit (HOSPITAL_BASED_OUTPATIENT_CLINIC_OR_DEPARTMENT_OTHER): Payer: Medicare Other | Admitting: Lab

## 2012-02-18 DIAGNOSIS — C8589 Other specified types of non-Hodgkin lymphoma, extranodal and solid organ sites: Secondary | ICD-10-CM | POA: Insufficient documentation

## 2012-02-18 DIAGNOSIS — C8590 Non-Hodgkin lymphoma, unspecified, unspecified site: Secondary | ICD-10-CM

## 2012-02-18 DIAGNOSIS — K7689 Other specified diseases of liver: Secondary | ICD-10-CM | POA: Insufficient documentation

## 2012-02-18 DIAGNOSIS — C189 Malignant neoplasm of colon, unspecified: Secondary | ICD-10-CM

## 2012-02-18 DIAGNOSIS — R161 Splenomegaly, not elsewhere classified: Secondary | ICD-10-CM | POA: Insufficient documentation

## 2012-02-18 DIAGNOSIS — K838 Other specified diseases of biliary tract: Secondary | ICD-10-CM | POA: Insufficient documentation

## 2012-02-18 DIAGNOSIS — C9110 Chronic lymphocytic leukemia of B-cell type not having achieved remission: Secondary | ICD-10-CM

## 2012-02-18 DIAGNOSIS — D689 Coagulation defect, unspecified: Secondary | ICD-10-CM

## 2012-02-18 DIAGNOSIS — D696 Thrombocytopenia, unspecified: Secondary | ICD-10-CM

## 2012-02-18 DIAGNOSIS — R599 Enlarged lymph nodes, unspecified: Secondary | ICD-10-CM | POA: Insufficient documentation

## 2012-02-18 DIAGNOSIS — D739 Disease of spleen, unspecified: Secondary | ICD-10-CM | POA: Insufficient documentation

## 2012-02-18 DIAGNOSIS — Z85038 Personal history of other malignant neoplasm of large intestine: Secondary | ICD-10-CM | POA: Insufficient documentation

## 2012-02-18 DIAGNOSIS — D7289 Other specified disorders of white blood cells: Secondary | ICD-10-CM

## 2012-02-18 DIAGNOSIS — Z9089 Acquired absence of other organs: Secondary | ICD-10-CM | POA: Insufficient documentation

## 2012-02-18 DIAGNOSIS — Z86711 Personal history of pulmonary embolism: Secondary | ICD-10-CM | POA: Insufficient documentation

## 2012-02-18 DIAGNOSIS — K5730 Diverticulosis of large intestine without perforation or abscess without bleeding: Secondary | ICD-10-CM | POA: Insufficient documentation

## 2012-02-18 DIAGNOSIS — I2699 Other pulmonary embolism without acute cor pulmonale: Secondary | ICD-10-CM

## 2012-02-18 LAB — MANUAL DIFFERENTIAL
Basophil: 0 % (ref 0–2)
EOS: 0 % (ref 0–7)
MONO: 2 % (ref 0–14)
Metamyelocytes: 0 % (ref 0–0)
Myelocytes: 0 % (ref 0–0)
Other Cell: 0 % (ref 0–0)
PLT EST: DECREASED

## 2012-02-18 LAB — CBC WITH DIFFERENTIAL/PLATELET
MCHC: 32.7 g/dL (ref 31.5–36.0)
MCV: 97.3 fL (ref 79.5–101.0)
Platelets: 95 10*3/uL — ABNORMAL LOW (ref 145–400)
RBC: 4.78 10*6/uL (ref 3.70–5.45)

## 2012-02-18 LAB — CMP (CANCER CENTER ONLY)
AST: 33 U/L (ref 11–38)
Albumin: 3.9 g/dL (ref 3.3–5.5)
Alkaline Phosphatase: 109 U/L — ABNORMAL HIGH (ref 26–84)
BUN, Bld: 17 mg/dL (ref 7–22)
Potassium: 5.3 mEq/L — ABNORMAL HIGH (ref 3.3–4.7)
Sodium: 142 mEq/L (ref 128–145)

## 2012-02-18 MED ORDER — IOHEXOL 300 MG/ML  SOLN
100.0000 mL | Freq: Once | INTRAMUSCULAR | Status: AC | PRN
  Administered 2012-02-18: 100 mL via INTRAVENOUS

## 2012-02-19 LAB — IGG, IGA, IGM: IgG (Immunoglobin G), Serum: 731 mg/dL (ref 690–1700)

## 2012-02-22 ENCOUNTER — Ambulatory Visit (HOSPITAL_BASED_OUTPATIENT_CLINIC_OR_DEPARTMENT_OTHER): Payer: Medicare Other | Admitting: Hematology and Oncology

## 2012-02-22 ENCOUNTER — Other Ambulatory Visit: Payer: Medicare Other

## 2012-02-22 ENCOUNTER — Ambulatory Visit: Payer: Medicare Other | Admitting: Lab

## 2012-02-22 ENCOUNTER — Encounter: Payer: Self-pay | Admitting: Hematology and Oncology

## 2012-02-22 ENCOUNTER — Encounter: Payer: Self-pay | Admitting: *Deleted

## 2012-02-22 VITALS — BP 128/60 | HR 48 | Temp 97.9°F | Ht 68.0 in | Wt 206.4 lb

## 2012-02-22 DIAGNOSIS — R634 Abnormal weight loss: Secondary | ICD-10-CM

## 2012-02-22 DIAGNOSIS — C8307 Small cell B-cell lymphoma, spleen: Secondary | ICD-10-CM

## 2012-02-22 DIAGNOSIS — C189 Malignant neoplasm of colon, unspecified: Secondary | ICD-10-CM

## 2012-02-22 NOTE — Progress Notes (Signed)
This office note has been dictated.

## 2012-02-22 NOTE — Patient Instructions (Signed)
Patient to follow up as instructed.  

## 2012-02-22 NOTE — Progress Notes (Signed)
CC:   [name], M.D. Tracy Duke, DO, Fax [phone] Tracy Neer, MD, Fax [phone]  IDENTIFYING STATEMENT:  Patient is a 78 year old woman with a history of a low-grade B-cell lymphoma/splenic marginal zone lymphoma who presents here for followup.  INTERVAL HISTORY:  Tracy Mclaughlin has a history of pulmonary emboli.  She informs me that she is now on Xarelto since she was also diagnosed with atrial fibrillation. She has been on this medication for a month.  She notes ongoing fatigue, more so than usual. She denied adenopathy.  She denies fever or chills.  She also notes ongoing weight loss of 22 pounds for last few months.  She is decreased appetite.  She has no pain.  She received a CT scan of the chest, abdomen, pelvis on 02/18/2012.  A CT angiogram showed no evidence of pulmonary embolism.  There was no mediastinal or hilar lymphadenopathy.  The abdomen showed stable splenomegaly and abdominal pelvic lymphadenopathy.  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  Stable biliary dilatation.  MEDICATIONS:  Reviewed and updated.  ALLERGIES:  None.  PAST MEDICAL HISTORY/FAMILY HISTORY/SOCIAL HISTORY:  Unchanged.  REVIEW OF SYSTEMS:  Besides B symptoms of weight loss, denies fever, chills, night sweats.  Admits to anorexia.  Denies pain.  Admits to generalized fatigue.  Denies nausea, vomiting, diarrhea, melena, hematochezia.  Rest review of systems negative.  PHYSICAL EXAMINATION:  Patient is alert and oriented x3.  Vitals:  Pulse 48, blood pressure 128/60, temperature 97.9, respirations 20, weight 206 pounds.  HEENT:  Head is atraumatic, normocephalic.  Sclerae anicteric. Mouth moist.  Neck:  Supple.  Chest:  Clear.  CVS:  First and second heart sounds present.  No added sounds or murmurs.  Abdomen:  Soft, nontender.  Spleen not palpable.  Extremities:  No calf tenderness. Pulses present and  symmetrical.  Lymph nodes:  No adenopathy.  CNS: Nonfocal.  LABORATORY DATA:  02/18/2012:  White cell count is 5.8, hemoglobin 15.2, hematocrit 46.5, platelets 95 (115).  Sodium 142, potassium 5.3, chloride 93, CO2 29, BUN 17, creatinine 0.9, glucose 112, t bilirubin 1, alkaline phosphatase 109, AST 33, ALT 20, calcium 8.9,  LDH 288.  CEA 1.9.  IgG 731, IgA 42, IgM 15.  IMPRESSION AND PLAN: 1. Tracy Mclaughlin is a 78 year old woman with splenic marginal zone     lymphoma  versus low grade B-cell lymphoma involving the spleen.  She was     undergoing surveillance but now presents with ongoing weight loss     and fatigue.  I also note her white cell count is increasing and     her platelets are decreasing.  With this said, I will recommend     therapy.  It would be reasonable to initially treat with single     agents using a monoclonal antibody such Rituxan as this has a low     toxic toxicity profile and good response rate.  I recommend     treating dose of  341m/sq m weekly x4 and then obtaining imaging     with a PET scan to assess response.  We also discussed following     this with maintenance therapy for at least 1 year.  We discussed     the toxicity profile and logistics of therapy.  The patient was     told that toxicity was primarily infusional but could also cause     peripheral edema, hypertension,  fever, fatigue, rash, nausea,     diarrhea weight gain and pancytopenia.  She was given literature to     review.  She will also attend chemotherapy class.  Therapy has been     scheduled to begin potentially in March 01, 2012.  She follows up     with me 4 weeks after this with a PET scan. 2. 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  The patient continue surveillance and     she requires a colonoscopy on  October of this year. 3. Pulmonary embolism, status post heparin bridge to Coumadin and now     on Xarelto initiated by primary care physician in [place]. 4. Atrial fibrillation.  Under care of Dr. Verdis Prime.    ______________________________ Laurice Record, M.D. LIO/MEDQ  D:  02/22/2012  T:  02/22/2012  Job:  [id number]

## 2012-02-23 ENCOUNTER — Telehealth: Payer: Self-pay | Admitting: Hematology and Oncology

## 2012-02-23 LAB — HEPATITIS PANEL, ACUTE: Hep A IgM: NEGATIVE

## 2012-02-23 NOTE — Telephone Encounter (Signed)
Aware of 3/13 tx time and woll p/u a sch at that time aom

## 2012-02-24 ENCOUNTER — Telehealth: Payer: Self-pay | Admitting: *Deleted

## 2012-02-24 NOTE — Telephone Encounter (Signed)
Called pt at home and spoke with husband as pt was napping.    Informed spouse that  Hepatitis panel was negative as per md's instructions.   Instructed husband re:  On chemo dates,  Pt needs to hold BP meds until after chemo -  Pt can resume blood pressure meds the evenings of chemo dates.   Husband voiced understanding and stated he would relay message to pt.

## 2012-02-28 ENCOUNTER — Other Ambulatory Visit: Payer: Self-pay | Admitting: *Deleted

## 2012-02-28 DIAGNOSIS — C8590 Non-Hodgkin lymphoma, unspecified, unspecified site: Secondary | ICD-10-CM

## 2012-02-28 MED ORDER — ALLOPURINOL 300 MG PO TABS: 300.0000 mg | ORAL_TABLET | Freq: Every day | ORAL | Status: DC

## 2012-03-01 ENCOUNTER — Ambulatory Visit (HOSPITAL_BASED_OUTPATIENT_CLINIC_OR_DEPARTMENT_OTHER): Payer: Medicare Other

## 2012-03-01 VITALS — BP 113/52 | HR 72 | Temp 98.4°F

## 2012-03-01 DIAGNOSIS — C8589 Other specified types of non-Hodgkin lymphoma, extranodal and solid organ sites: Secondary | ICD-10-CM

## 2012-03-01 DIAGNOSIS — C180 Malignant neoplasm of cecum: Secondary | ICD-10-CM

## 2012-03-01 DIAGNOSIS — C8590 Non-Hodgkin lymphoma, unspecified, unspecified site: Secondary | ICD-10-CM

## 2012-03-01 DIAGNOSIS — Z5112 Encounter for antineoplastic immunotherapy: Secondary | ICD-10-CM

## 2012-03-01 MED ORDER — FAMOTIDINE IN NACL 20-0.9 MG/50ML-% IV SOLN
20.0000 mg | INTRAVENOUS | Status: AC
  Administered 2012-03-01: 20 mg via INTRAVENOUS

## 2012-03-01 MED ORDER — DIPHENHYDRAMINE HCL 25 MG PO CAPS
50.0000 mg | ORAL_CAPSULE | Freq: Once | ORAL | Status: AC
  Administered 2012-03-01: 50 mg via ORAL

## 2012-03-01 MED ORDER — METHYLPREDNISOLONE SODIUM SUCC 125 MG IJ SOLR
125.0000 mg | Freq: Once | INTRAMUSCULAR | Status: AC | PRN
  Administered 2012-03-01: 125 mg via INTRAVENOUS

## 2012-03-01 MED ORDER — SODIUM CHLORIDE 0.9 % IV SOLN
Freq: Once | INTRAVENOUS | Status: AC
  Administered 2012-03-01: 10:00:00 via INTRAVENOUS

## 2012-03-01 MED ORDER — ACETAMINOPHEN 325 MG PO TABS
650.0000 mg | ORAL_TABLET | Freq: Once | ORAL | Status: AC
  Administered 2012-03-01: 650 mg via ORAL

## 2012-03-01 MED ORDER — SODIUM CHLORIDE 0.9 % IV SOLN
375.0000 mg/m2 | Freq: Once | INTRAVENOUS | Status: AC
  Administered 2012-03-01: 800 mg via INTRAVENOUS
  Filled 2012-03-01: qty 80

## 2012-03-01 MED ORDER — MEPERIDINE HCL 25 MG/ML IJ SOLN
25.0000 mg | Freq: Once | INTRAMUSCULAR | Status: AC
  Administered 2012-03-01: 25 mg via INTRAVENOUS

## 2012-03-01 MED ORDER — DIPHENHYDRAMINE HCL 50 MG/ML IJ SOLN
25.0000 mg | Freq: Once | INTRAMUSCULAR | Status: AC | PRN
  Administered 2012-03-01: 25 mg via INTRAVENOUS

## 2012-03-01 NOTE — Patient Instructions (Signed)
[facility] Discharge Instructions for Patients Receiving Chemotherapy  Today you received the following chemotherapy agents Rituxan   To help prevent nausea and vomiting after your treatment, we encourage you to take your nausea medication    If you develop nausea and vomiting that is not controlled by your nausea medication, call the clinic. If it is after clinic hours your family physician or the after hours number for the clinic or go to the Emergency Department.   BELOW ARE SYMPTOMS THAT SHOULD BE REPORTED IMMEDIATELY:  *FEVER GREATER THAN 100.5 F  *CHILLS WITH OR WITHOUT FEVER  NAUSEA AND VOMITING THAT IS NOT CONTROLLED WITH YOUR NAUSEA MEDICATION  *UNUSUAL SHORTNESS OF BREATH  *UNUSUAL BRUISING OR BLEEDING  TENDERNESS IN MOUTH AND THROAT WITH OR WITHOUT PRESENCE OF ULCERS  *URINARY PROBLEMS  *BOWEL PROBLEMS  UNUSUAL RASH Items with * indicate a potential emergency and should be followed up as soon as possible.  One of the nurses will contact you 24 hours after your treatment. Please let the nurse know about any problems that you may have experienced. Feel free to call the clinic you have any questions or concerns. The clinic phone number is [phone].   I have been informed and understand all the instructions given to me. I know to contact the clinic, my physician, or go to the Emergency Department if any problems should occur. I do not have any questions at this time, but understand that I may call the clinic during office hours   should I have any questions or need assistance in obtaining follow up care.    __________________________________________  _____________  __________ Signature of Patient or Authorized Representative            Date                   Time    __________________________________________ Nurse's Signature    

## 2012-03-01 NOTE — Progress Notes (Signed)
1040-Rituxan started at 21 ml/hr for 30 minutes.  1205-Pt complaining of "teeth chattering" and "mid back pain".  Rituxan stopped and Dr. Dalene Carrow notified of pt.'s complaints.  Order received to wait for 30 minutes and re-assess patient.  If patient without complaints at that time, resume Rituxan at 100 mg/hr and give Solumedrol 125mg   IV as ordered per standing orders.  1225-Pt now with shaking chills. Dr Dalene Carrow notified and order received to give additional Benadryl 25mg  IV, Demerol 25mg  IV and Pepcid 20mg  IV.  1255-Dr Odogwu to infusion room to assess patient. Order received to wait 30 minutes and if pt is without complaints at that time to restart Rituxan at 100mg /hr.  1325-Pt is without complaints at this time.  VS stable.  Rituxan started at 100mg /hr at 42 ml/hr for 30 minutes.  Pt verbalizes an understanding to notify RN of any complaints.

## 2012-03-02 ENCOUNTER — Telehealth: Payer: Self-pay | Admitting: *Deleted

## 2012-03-02 NOTE — Telephone Encounter (Signed)
Message copied by Augusto Garbe on Thu Mar 02, 2012 10:51 AM ------      Message from: [place], [place] P      Created: Wed Mar 01, 2012  5:16 PM      Regarding: chemo f/u       This may be a duplicate            Rituxan - Dr LO             Pt did have a rxn, but was re-challenged and finished without problems.

## 2012-03-02 NOTE — Telephone Encounter (Signed)
Called patient and spoke with spouse Tracy Mclaughlin.  Per Tracy Mclaughlin she is doing very well.  He tried to get her to rest but she had to go to the grocery store.  She wanted to get out and try this.  "She is better than before the treatment."  Asked that he tell her we called and for her to call if she has any questions or symptoms to report.

## 2012-03-08 ENCOUNTER — Ambulatory Visit (HOSPITAL_BASED_OUTPATIENT_CLINIC_OR_DEPARTMENT_OTHER): Payer: Medicare Other

## 2012-03-08 VITALS — BP 147/72 | HR 56 | Temp 97.5°F

## 2012-03-08 DIAGNOSIS — Z5112 Encounter for antineoplastic immunotherapy: Secondary | ICD-10-CM

## 2012-03-08 DIAGNOSIS — C8307 Small cell B-cell lymphoma, spleen: Secondary | ICD-10-CM

## 2012-03-08 DIAGNOSIS — C8590 Non-Hodgkin lymphoma, unspecified, unspecified site: Secondary | ICD-10-CM

## 2012-03-08 MED ORDER — SODIUM CHLORIDE 0.9 % IV SOLN: Freq: Once | INTRAVENOUS | Status: DC

## 2012-03-08 MED ORDER — DIPHENHYDRAMINE HCL 25 MG PO CAPS
50.0000 mg | ORAL_CAPSULE | Freq: Once | ORAL | Status: AC
  Administered 2012-03-08: 50 mg via ORAL

## 2012-03-08 MED ORDER — ACETAMINOPHEN 325 MG PO TABS
650.0000 mg | ORAL_TABLET | Freq: Once | ORAL | Status: AC
  Administered 2012-03-08: 650 mg via ORAL

## 2012-03-08 MED ORDER — SODIUM CHLORIDE 0.9 % IV SOLN
375.0000 mg/m2 | Freq: Once | INTRAVENOUS | Status: AC
  Administered 2012-03-08: 800 mg via INTRAVENOUS
  Filled 2012-03-08: qty 80

## 2012-03-08 NOTE — Progress Notes (Signed)
Rituxan started at 0940 at rate of 100 mg/hr: 42 mL/hr X 21 mL.  Patient instructed to let staff know at once if any chills, back pain, chest pain, or anything out of ordinary.  Rate increased to 200 mg/hr: 83 mL/hr X 42 mL.  Patient with no complaints at this time. Vital signs stable.  Rate increased to 300 mg/hr: 125 mL/hr X 63 mL.  No complaints from patient at this time.  Vital signs stable.  Rate increased to 400 mg/hr: 167 mL/hr X 83 mL.  No complaints at this time.  Vital signs stable.  Rate remains at 167 mL/hr for remainder of infusion.  Vital signs remain stable

## 2012-03-15 ENCOUNTER — Ambulatory Visit (HOSPITAL_BASED_OUTPATIENT_CLINIC_OR_DEPARTMENT_OTHER): Payer: Medicare Other

## 2012-03-15 VITALS — BP 139/50 | HR 47 | Temp 98.3°F

## 2012-03-15 DIAGNOSIS — C8590 Non-Hodgkin lymphoma, unspecified, unspecified site: Secondary | ICD-10-CM

## 2012-03-15 DIAGNOSIS — Z5112 Encounter for antineoplastic immunotherapy: Secondary | ICD-10-CM

## 2012-03-15 DIAGNOSIS — C8589 Other specified types of non-Hodgkin lymphoma, extranodal and solid organ sites: Secondary | ICD-10-CM

## 2012-03-15 MED ORDER — SODIUM CHLORIDE 0.9 % IV SOLN
Freq: Once | INTRAVENOUS | Status: AC
  Administered 2012-03-15: 09:00:00 via INTRAVENOUS

## 2012-03-15 MED ORDER — ACETAMINOPHEN 325 MG PO TABS
650.0000 mg | ORAL_TABLET | Freq: Once | ORAL | Status: AC
  Administered 2012-03-15: 650 mg via ORAL

## 2012-03-15 MED ORDER — SODIUM CHLORIDE 0.9 % IV SOLN
375.0000 mg/m2 | Freq: Once | INTRAVENOUS | Status: AC
  Administered 2012-03-15: 800 mg via INTRAVENOUS
  Filled 2012-03-15: qty 80

## 2012-03-15 MED ORDER — METHYLPREDNISOLONE SODIUM SUCC 40 MG IJ SOLR
40.0000 mg | Freq: Once | INTRAMUSCULAR | Status: AC
  Administered 2012-03-15: 40 mg via INTRAVENOUS

## 2012-03-15 MED ORDER — SODIUM CHLORIDE 0.9 % IV SOLN
375.0000 mg/m2 | Freq: Once | INTRAVENOUS | Status: DC
  Filled 2012-03-15: qty 80

## 2012-03-15 MED ORDER — DIPHENHYDRAMINE HCL 25 MG PO CAPS
50.0000 mg | ORAL_CAPSULE | Freq: Once | ORAL | Status: AC
  Administered 2012-03-15: 50 mg via ORAL

## 2012-03-15 NOTE — Progress Notes (Signed)
Rituxan started at 0935 at rate of 100 mL/hr X 50 mL.    Rate increased to 200 mL/hr for remainder of infusion.  No complaints from patient, vital signs stable.

## 2012-03-22 ENCOUNTER — Ambulatory Visit (HOSPITAL_BASED_OUTPATIENT_CLINIC_OR_DEPARTMENT_OTHER): Payer: Medicare Other

## 2012-03-22 VITALS — BP 138/68 | HR 45 | Temp 97.9°F

## 2012-03-22 DIAGNOSIS — C8307 Small cell B-cell lymphoma, spleen: Secondary | ICD-10-CM

## 2012-03-22 DIAGNOSIS — C8590 Non-Hodgkin lymphoma, unspecified, unspecified site: Secondary | ICD-10-CM

## 2012-03-22 DIAGNOSIS — Z5112 Encounter for antineoplastic immunotherapy: Secondary | ICD-10-CM

## 2012-03-22 MED ORDER — SODIUM CHLORIDE 0.9 % IV SOLN
Freq: Once | INTRAVENOUS | Status: AC
  Administered 2012-03-22: 10:00:00 via INTRAVENOUS

## 2012-03-22 MED ORDER — ACETAMINOPHEN 325 MG PO TABS
650.0000 mg | ORAL_TABLET | Freq: Once | ORAL | Status: AC
  Administered 2012-03-22: 650 mg via ORAL

## 2012-03-22 MED ORDER — SODIUM CHLORIDE 0.9 % IV SOLN
375.0000 mg/m2 | Freq: Once | INTRAVENOUS | Status: AC
  Administered 2012-03-22: 800 mg via INTRAVENOUS
  Filled 2012-03-22: qty 80

## 2012-03-22 MED ORDER — DIPHENHYDRAMINE HCL 25 MG PO CAPS
50.0000 mg | ORAL_CAPSULE | Freq: Once | ORAL | Status: AC
  Administered 2012-03-22: 50 mg via ORAL

## 2012-04-03 ENCOUNTER — Other Ambulatory Visit (HOSPITAL_BASED_OUTPATIENT_CLINIC_OR_DEPARTMENT_OTHER): Payer: Medicare Other | Admitting: Lab

## 2012-04-03 ENCOUNTER — Encounter (HOSPITAL_COMMUNITY)
Admission: RE | Admit: 2012-04-03 | Discharge: 2012-04-03 | Disposition: A | Discharge status: Home / Self Care | Payer: Medicare Other | Source: Ambulatory Visit | Attending: Hematology and Oncology | Admitting: Hematology and Oncology

## 2012-04-03 DIAGNOSIS — C189 Malignant neoplasm of colon, unspecified: Secondary | ICD-10-CM

## 2012-04-03 DIAGNOSIS — R634 Abnormal weight loss: Secondary | ICD-10-CM

## 2012-04-03 DIAGNOSIS — E041 Nontoxic single thyroid nodule: Secondary | ICD-10-CM | POA: Insufficient documentation

## 2012-04-03 DIAGNOSIS — C8307 Small cell B-cell lymphoma, spleen: Secondary | ICD-10-CM

## 2012-04-03 DIAGNOSIS — C8589 Other specified types of non-Hodgkin lymphoma, extranodal and solid organ sites: Secondary | ICD-10-CM | POA: Insufficient documentation

## 2012-04-03 LAB — CBC WITH DIFFERENTIAL/PLATELET
Basophils Absolute: 0.1 10*3/uL (ref 0.0–0.1)
Eosinophils Absolute: 0.2 10*3/uL (ref 0.0–0.5)
HCT: 40.4 % (ref 34.8–46.6)
HGB: 13.5 g/dL (ref 11.6–15.9)
LYMPH%: 44.2 % (ref 14.0–49.7)
MCV: 96.2 fL (ref 79.5–101.0)
MONO%: 6.4 % (ref 0.0–14.0)
NEUT#: 2.5 10*3/uL (ref 1.5–6.5)
NEUT%: 45.5 % (ref 38.4–76.8)
Platelets: 109 10*3/uL — ABNORMAL LOW (ref 145–400)

## 2012-04-03 LAB — COMPREHENSIVE METABOLIC PANEL
CO2: 28 mEq/L (ref 19–32)
Creatinine, Ser: 0.84 mg/dL (ref 0.50–1.10)
Glucose, Bld: 119 mg/dL — ABNORMAL HIGH (ref 70–99)
Sodium: 142 mEq/L (ref 135–145)
Total Bilirubin: 1.1 mg/dL (ref 0.3–1.2)
Total Protein: 6.1 g/dL (ref 6.0–8.3)

## 2012-04-03 LAB — LACTATE DEHYDROGENASE: LDH: 124 U/L (ref 94–250)

## 2012-04-03 MED ORDER — FLUDEOXYGLUCOSE F - 18 (FDG) INJECTION
17.5000 | Freq: Once | INTRAVENOUS | Status: AC | PRN
  Administered 2012-04-03: 17.5 via INTRAVENOUS

## 2012-04-06 ENCOUNTER — Ambulatory Visit (HOSPITAL_BASED_OUTPATIENT_CLINIC_OR_DEPARTMENT_OTHER): Payer: Medicare Other | Admitting: Hematology and Oncology

## 2012-04-06 ENCOUNTER — Encounter: Payer: Self-pay | Admitting: Hematology and Oncology

## 2012-04-06 ENCOUNTER — Telehealth: Payer: Self-pay | Admitting: Hematology and Oncology

## 2012-04-06 VITALS — BP 149/58 | HR 50 | Temp 98.2°F | Ht 68.0 in | Wt 202.3 lb

## 2012-04-06 DIAGNOSIS — C8589 Other specified types of non-Hodgkin lymphoma, extranodal and solid organ sites: Secondary | ICD-10-CM

## 2012-04-06 DIAGNOSIS — I4891 Unspecified atrial fibrillation: Secondary | ICD-10-CM | POA: Insufficient documentation

## 2012-04-06 DIAGNOSIS — Z86711 Personal history of pulmonary embolism: Secondary | ICD-10-CM

## 2012-04-06 DIAGNOSIS — C8590 Non-Hodgkin lymphoma, unspecified, unspecified site: Secondary | ICD-10-CM

## 2012-04-06 DIAGNOSIS — C189 Malignant neoplasm of colon, unspecified: Secondary | ICD-10-CM

## 2012-04-06 NOTE — Patient Instructions (Signed)
Patient to follow up as instructed.   Current Outpatient Prescriptions  Medication Sig Dispense Refill  . carvedilol (COREG) 6.25 MG tablet Take 6.25 mg by mouth 2 (two) times daily with a meal.        . citalopram (CELEXA) 10 MG tablet Take 10 mg by mouth daily.      Marland Kitchen diltiazem (TIAZAC) 120 MG 24 hr capsule Take 120 mg by mouth daily.      . diphenhydrAMINE (BENADRYL) 25 MG tablet Take 25 mg by mouth as needed. itching      . loratadine (CLARITIN) 10 MG tablet Take 10 mg by mouth daily.      Marland Kitchen losartan (COZAAR) 100 MG tablet Take 100 mg by mouth daily.        . meclizine (ANTIVERT) 25 MG tablet Take 25 mg by mouth 3 (three) times daily as needed. dizziness       . Rivaroxaban (XARELTO) 20 MG TABS Take 1 tablet by mouth daily.      Marland Kitchen allopurinol (ZYLOPRIM) 300 MG tablet Take 1 tablet (300 mg total) by mouth daily. Take daily for 2 weeks -  Starting  02/28/12.  30 tablet  0        April 2013  Sunday Monday Tuesday Wednesday Thursday Friday Saturday     1   2   3   INFUSION 6 HR   9:45 AM  (360 min.)  [facility] F20  [facility] 4   5   6       Cycle 4, Day 1     7   8   9   10   11   12   13            14   15   LAB MO    10:00 AM  (15 min.)  Kimberly D Morris  [facility]   NM 60MIN [facility] PET1  10:30 AM  (60 min.)  Wl-Nm Pet 1  [facility] 16   17   18   EST PT 30  11:30 AM  (30 min.)  Ginette Bradway I Qualyn Oyervides, MD   [facility] 19   20            21   22   23   24   25   26   27            28    29   30                          Treatment Details  03/22/2012 - Cycle 4, Day 1     Other: SCHEDULING COMMUNICATION     Hydration: sodium chloride     Nursing Orders: sodium chloride, heparin lock flush, heparin lock flush, alteplase (CATHFLO ACTIVASE), sodium chloride  Pre-Medications: acetaminophen (TYLENOL), diphenhydrAMINE (BENADRYL)     Chemotherapy: rituximab (RITUXAN)     Emergency Medications: diphenhydrAMINE (BENADRYL), sodium chloride, methylPREDNISolone sodium succinate (SOLU-MEDROL), EPINEPHrine (ADRENALIN), EPINEPHrine (ADRENALIN), diphenhydrAMINE (BENADRYL), albuterol (PROVENTIL)

## 2012-04-06 NOTE — Progress Notes (Signed)
CC:   Tracy Duke, MD, Fax [phone] Tracy Neer, MD, Fax [phone] Tracy Mclaughlin, M.D.  IDENTIFYING STATEMENT:  The patient is a 78 year old woman with a history of a low-grade B-cell lymphoma/splenic marginal zone lymphoma who presents for followup to discuss results.  INTERVAL HISTORY:  Tracy Mclaughlin completed 4 weekly treatments of Rituxan, last treated on March 22, 2012.  Because of infusion reaction with her first Rituxan treatments, she has tolerated treatment very minimal difficulty.  She feels a little fatigued, but not as much as previous. She is able to continued all activities of daily living.  She has no nausea, vomiting, abdominal pain, fever, chills or night sweats.  PET scan on 04/03/2012 notes a nodule in the right lobe of the thyroid gland that exhibited malignant range FDG uptake of 16.  The chest showed 2 lymph nodes that were borderline in size, measuring 1.1 cm and 0.8 cm respectively, with an SUV of 5.7.  There was no supraclavicular or axilla adenopathy.  There is no hypermetabolic nodules or mass identified within the pulmonary parenchyma.  The abdomen and pelvis showed that the spleen had decreased in size, now measuring 10 cm, previously 18 cm.  The liver was unremarkable.  There are no hypermetabolic nodes in the abdomen or pelvis.  The skeleton showed no focal hypermetabolic activity.  MEDICATIONS:  Reviewed and updated.  ALLERGIES:  None.  PAST MEDICAL HISTORY/FAMILY HISTORY/SOCIAL HISTORY:  Unchanged.  REVIEW OF SYSTEMS:  Ten point review of systems negative.  PHYSICAL EXAMINATION:  Patient is alert and oriented x3.  Vitals: Pulse 50, blood pressure 149/58, temperature 98.2, respirations 18, weight is 202.3 pounds.  HEENT:  Head is atraumatic, normocephalic.  Sclerae anicteric.  Mouth moist.  Neck:  Supple.  Chest:  Clear to percussion auscultation.  CVS:  First and second heart sounds present.  No added sounds or murmurs.  Abdomen:  Soft,  nontender.  Bowel sounds present. Lymph nodes:  No cervical, axillary or inguinal adenopathy. Extremities:  No calf tenderness.  Pulses present symmetrical.  CNS: Nonfocal.  LAB DATA:  04/03/2012:  White cell count 5.5, previously 56.  Hemoglobin 13.5, hematocrit 40.4, platelets 109.  Sodium 142, potassium 4.7 chloride 106, CO2 28, BUN 20, creatinine 0.84, glucose 119, T-bilirubin 1.1, alkaline phosphatase 73, AST 19, ALT 12, calcium 9.4, LDH 124 (238).  Results of PET scan as in interval history.  IMPRESSION AND PLAN:  Tracy Mclaughlin is a 78 year old woman with: 1. 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  Because of developing B symptoms, she is     status post Rituxan weekly x4 from 03/13 through 04/01/2012.  A     followup PET scan has indicated abdominal and pelvic resolution of     lymphoma.  They may be 2 residual lymph nodes in the superior     mediastinum.  In addition, there was a nodule in her thyroid gland     that exhibited malignant FDG uptake.  Tracy Mclaughlin's counts have     normalized.  She will begin maintenance Rituxan dosed every 2     months, beginning 04/10/2012.  She will then follow up with a CT     scan of the chest in 2 months' time to follow up 2 borderline     pulmonary nodules. With regard to the nodule in her thyroid, I will     schedule an ultrasound and then forward the results to the  appropriate specialist. 2. Status post excision laparotomy with right colectomy on 10/19/2011     in [place], [place] for a T3 N0 moderately differentiated     mucinous adenocarcinoma of the cecum with 0/16 nodes positive for     tumor.  She continues surveillance and requires a colonoscopy in     October of this year.  At the time of her next followup visit in     June, besides a CBC, CMET and LDH, will also obtain a CEA level. 3. History of pulmonary embolism, status post heparin bridge to     Coumadin.  Currently on  Xarelto. 4. Atrial fibrillation- followed by Dr. Verdis Prime    ______________________________ Laurice Record, M.D. LIO/MEDQ  D:  04/06/2012  T:  04/06/2012  Job:  [id number]

## 2012-04-06 NOTE — Telephone Encounter (Signed)
Gave pt calendar for 4/22 U/S @ [facility], she also have appt for 04/17/12. Pt will come on 6/22 lab and CT.  She is aware of appt on 6/26 md and infusion. Md changed appt time from 2:30 to 12:15 per  Dr. Dalene Carrow

## 2012-04-06 NOTE — Progress Notes (Signed)
This office note has been dictated.

## 2012-04-07 ENCOUNTER — Other Ambulatory Visit: Payer: Self-pay | Admitting: Hematology and Oncology

## 2012-04-07 ENCOUNTER — Other Ambulatory Visit: Payer: Self-pay | Admitting: *Deleted

## 2012-04-07 ENCOUNTER — Telehealth: Payer: Self-pay | Admitting: *Deleted

## 2012-04-07 NOTE — Telephone Encounter (Signed)
Called pt at home and spoke with husband Arden.   Gave Arden new appt date and time for pt's chemo  On 04/10/12 at 1 pm.   Arden voiced understanding and stated he would relay message to pt .

## 2012-04-10 ENCOUNTER — Ambulatory Visit (HOSPITAL_BASED_OUTPATIENT_CLINIC_OR_DEPARTMENT_OTHER): Payer: Medicare Other

## 2012-04-10 ENCOUNTER — Ambulatory Visit (HOSPITAL_COMMUNITY)
Admission: RE | Admit: 2012-04-10 | Discharge: 2012-04-10 | Disposition: A | Discharge status: Home / Self Care | Payer: Medicare Other | Source: Ambulatory Visit | Attending: Hematology and Oncology | Admitting: Hematology and Oncology

## 2012-04-10 VITALS — BP 148/66 | HR 20 | Temp 97.8°F

## 2012-04-10 DIAGNOSIS — E042 Nontoxic multinodular goiter: Secondary | ICD-10-CM | POA: Insufficient documentation

## 2012-04-10 DIAGNOSIS — C8589 Other specified types of non-Hodgkin lymphoma, extranodal and solid organ sites: Secondary | ICD-10-CM

## 2012-04-10 DIAGNOSIS — C189 Malignant neoplasm of colon, unspecified: Secondary | ICD-10-CM

## 2012-04-10 DIAGNOSIS — Z5112 Encounter for antineoplastic immunotherapy: Secondary | ICD-10-CM

## 2012-04-10 DIAGNOSIS — C8590 Non-Hodgkin lymphoma, unspecified, unspecified site: Secondary | ICD-10-CM

## 2012-04-10 MED ORDER — SODIUM CHLORIDE 0.9 % IV SOLN
375.0000 mg/m2 | Freq: Once | INTRAVENOUS | Status: AC
  Administered 2012-04-10: 800 mg via INTRAVENOUS
  Filled 2012-04-10: qty 80

## 2012-04-10 MED ORDER — DIPHENHYDRAMINE HCL 25 MG PO CAPS
50.0000 mg | ORAL_CAPSULE | Freq: Once | ORAL | Status: AC
  Administered 2012-04-10: 50 mg via ORAL

## 2012-04-10 MED ORDER — ACETAMINOPHEN 325 MG PO TABS
650.0000 mg | ORAL_TABLET | Freq: Once | ORAL | Status: AC
Start: 2012-04-10 — End: 2012-04-10
  Administered 2012-04-10: 650 mg via ORAL

## 2012-04-10 NOTE — Progress Notes (Signed)
Rituxan started at 1428 at 100mg /hr for 50ml. At 1458:   VS stable.  Pt. Denies any problems.  Rate increased to 200mg /hr.   Vol of 

## 2012-04-10 NOTE — Patient Instructions (Signed)
[facility] Discharge Instructions for Patients Receiving Chemotherapy  Today you received the following chemotherapy agents Rituxan To help prevent nausea and vomiting after your treatment, we encourage you to take your nausea medication as prescribed DR. Odogwu.  If you develop nausea and vomiting that is not controlled by your nausea medication, call the clinic. If it is after clinic hours your family physician or the after hours number for the clinic or go to the Emergency Department.   BELOW ARE SYMPTOMS THAT SHOULD BE REPORTED IMMEDIATELY:  *FEVER GREATER THAN 100.5 F  *CHILLS WITH OR WITHOUT FEVER  NAUSEA AND VOMITING THAT IS NOT CONTROLLED WITH YOUR NAUSEA MEDICATION  *UNUSUAL SHORTNESS OF BREATH  *UNUSUAL BRUISING OR BLEEDING  TENDERNESS IN MOUTH AND THROAT WITH OR WITHOUT PRESENCE OF ULCERS  *URINARY PROBLEMS  *BOWEL PROBLEMS  UNUSUAL RASH Items with * indicate a potential emergency and should be followed up as soon as possible.  . Feel free to call the clinic you have any questions or concerns. The clinic phone number is [phone].   I have been informed and understand all the instructions given to me. I know to contact the clinic, my physician, or go to the Emergency Department if any problems should occur. I do not have any questions at this time, but understand that I may call the clinic during office hours   should I have any questions or need assistance in obtaining follow up care.    __________________________________________  _____________  __________ Signature of Patient or Authorized Representative            Date                   Time    __________________________________________ Nurse's Signature

## 2012-04-13 ENCOUNTER — Other Ambulatory Visit: Payer: Self-pay | Admitting: Hematology and Oncology

## 2012-04-13 DIAGNOSIS — E079 Disorder of thyroid, unspecified: Secondary | ICD-10-CM

## 2012-04-13 DIAGNOSIS — C8590 Non-Hodgkin lymphoma, unspecified, unspecified site: Secondary | ICD-10-CM

## 2012-04-14 ENCOUNTER — Telehealth: Payer: Self-pay | Admitting: Nurse Practitioner

## 2012-04-14 ENCOUNTER — Telehealth: Payer: Self-pay | Admitting: Hematology and Oncology

## 2012-04-14 NOTE — Telephone Encounter (Signed)
Left message requesting return call from patient.

## 2012-04-14 NOTE — Telephone Encounter (Signed)
Informed pt per Dr. Dalene Carrow that thyroid needs to be biopsied.  Also Dr. Dalene Carrow would like to refer pt to an endocrinologist.  Pt asked about ultrasound results.  RN informed pt that ultrasound showed nodules that had been viewed on PET scan and that biopsy had been recommended due to increased uptake on PET.  Pt verbalized understanding.

## 2012-04-14 NOTE — Telephone Encounter (Signed)
lmonvm for Tressa Busman, NP Coord @ [facility] endocrinology requesting appt w/[facility] per 4/25 pof. BX appt already scheduled @ [facility] and confirmed w/jennifer. Per jennifer pt should have been contacted this morning when appt was scheduled. Called pt and lmonvm confirming bx appt @ [facility] and informing pt that she will be contacted w/appt for dr Sharl Ma when his office returns my call. Other appts for June remain the same.

## 2012-04-14 NOTE — Telephone Encounter (Signed)
Message copied by Barbara Cower on Fri Apr 14, 2012 11:27 AM ------      Message from: Arlan Organ I      Created: Thu Apr 13, 2012  4:30 PM       Schedule a US biopsy of thyroid and refer to Endocrinology.  Jomar Denz please call pt and inform her of plan.  Thanks

## 2012-04-17 ENCOUNTER — Ambulatory Visit: Payer: PRIVATE HEALTH INSURANCE

## 2012-04-18 ENCOUNTER — Ambulatory Visit: Payer: PRIVATE HEALTH INSURANCE

## 2012-04-21 ENCOUNTER — Telehealth: Payer: Self-pay | Admitting: Hematology and Oncology

## 2012-04-21 ENCOUNTER — Encounter (HOSPITAL_COMMUNITY): Payer: Self-pay

## 2012-04-21 ENCOUNTER — Ambulatory Visit (HOSPITAL_COMMUNITY)
Admission: RE | Admit: 2012-04-21 | Discharge: 2012-04-21 | Disposition: A | Discharge status: Home / Self Care | Payer: Medicare Other | Source: Ambulatory Visit | Attending: Hematology and Oncology | Admitting: Hematology and Oncology

## 2012-04-21 DIAGNOSIS — C8590 Non-Hodgkin lymphoma, unspecified, unspecified site: Secondary | ICD-10-CM

## 2012-04-21 DIAGNOSIS — E079 Disorder of thyroid, unspecified: Secondary | ICD-10-CM

## 2012-04-21 HISTORY — DX: Unspecified atrial fibrillation: I48.91

## 2012-04-21 NOTE — Telephone Encounter (Signed)
Completed referral sent by Sherrion from [facility] and faxed back to Sherrion along w/copy of order. Sherrion will contact pt w/appt and also call me with d/t.

## 2012-04-21 NOTE — Progress Notes (Signed)
Patient presented today for needle biopsy of her thyroid which was hypermetabolic on PET.  Upon questioning patient and reviewing history was noted patient to by on Xrelto for atrial fib and PE.  Dr. Dalene Carrow office notified biopsy could not be performed today given that the blood thinner was not held.  Patient states she was never asked about nor told to hold.  Contacted her cardiologist, Dr. Verdis Prime and spoke with their coagulation pharmacist who stated patient could be off the medication for 48 hours prior to the biopsy and that the medication could be resumed later that evening if without complications.  Furthermore, he felt no need for lovenox bridging at this time.  Patient informed of their recommendations in holding this medication and her biopsy has been ressheduled for Monday  04/24/12.  Dr. Daune Perch office also made aware that biopsy had to be rescheduled as patient to see him for an appointment following the biopsy per Dr. Dalene Carrow.

## 2012-04-24 ENCOUNTER — Ambulatory Visit (HOSPITAL_COMMUNITY)
Admission: RE | Admit: 2012-04-24 | Discharge: 2012-04-24 | Disposition: A | Discharge status: Home / Self Care | Payer: Medicare Other | Source: Ambulatory Visit | Attending: Hematology and Oncology | Admitting: Hematology and Oncology

## 2012-04-24 DIAGNOSIS — E041 Nontoxic single thyroid nodule: Secondary | ICD-10-CM | POA: Insufficient documentation

## 2012-04-24 NOTE — Procedures (Signed)
Procedure : LLP thyroid nodule FNA x 4 with 25 g needles under US guidance  Complications : none immediate. Pt tolerated well

## 2012-04-25 ENCOUNTER — Telehealth: Payer: Self-pay | Admitting: Hematology and Oncology

## 2012-04-25 NOTE — Telephone Encounter (Signed)
S/w Sherrion @ Dr. Daune Perch office today and per Sherrion pt has appt for 6/5 @ 10:30 am. Per Sherrion she has s/w pt - she is aware and also aware that when bx results are in if pt needs to be seen sooner that 6/5 they will arrange a sooner appt.

## 2012-04-26 ENCOUNTER — Telehealth (INDEPENDENT_AMBULATORY_CARE_PROVIDER_SITE_OTHER): Payer: Self-pay | Admitting: General Surgery

## 2012-04-26 ENCOUNTER — Telehealth: Payer: Self-pay | Admitting: *Deleted

## 2012-04-26 NOTE — Telephone Encounter (Signed)
Dr. Dalene Carrow spoke with pt today.   Called Dr. Ann Lions office and spoke with Arline Asp, RN.   Informed Cindy that Dr. Dalene Carrow had spoken with Dr. Derrell Lolling yesterday about pt's thyroid scan results.   Arline Asp stated she would relay message to md for an appt for pt.   Arline Asp stated she would let nurse know as well as pt of appt.

## 2012-04-27 NOTE — Telephone Encounter (Signed)
Please call Jason Fila.

## 2012-05-01 ENCOUNTER — Encounter (INDEPENDENT_AMBULATORY_CARE_PROVIDER_SITE_OTHER): Payer: Self-pay | Admitting: General Surgery

## 2012-05-01 ENCOUNTER — Ambulatory Visit (INDEPENDENT_AMBULATORY_CARE_PROVIDER_SITE_OTHER): Payer: Medicare Other | Admitting: General Surgery

## 2012-05-01 VITALS — BP 122/68 | HR 48 | Temp 97.8°F | Resp 14 | Ht 68.25 in | Wt 204.8 lb

## 2012-05-01 DIAGNOSIS — C73 Malignant neoplasm of thyroid gland: Secondary | ICD-10-CM | POA: Insufficient documentation

## 2012-05-01 NOTE — Progress Notes (Signed)
Patient ID: Tracy Mclaughlin, female   DOB: 1934/03/07, 78 y.o.   MRN: [id number]  Chief Complaint  Patient presents with  . New Evaluation    New Pt. Thyroid Ca    HPI Tracy Mclaughlin is a 78 y.o. female.  She is referred by Dr. Thalia Party for management of a newly diagnosed papillary carcinoma of the thyroid.  This very pleasant woman and her husband live in [place], [place]. She is a retired Engineer, civil (consulting). 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 Dalene Carrow for this. She has been treated with Rituxan and will continue to receive that in the future.  She underwent right colectomy on October 19, 2011 in [place],  [place] for a T3 N0 cancer of the cecum. 0/16 nodes were harvested. She did very well from that surgery without any problems. She is being followed with surveillance only for that.  She has had one episode of atrial fibrillation in the past.  She was diagnosed with deep venous thrombosis and pulmonary embolism in December 2012.  In terms of her thyroid gland she is basically asymptomatic. She does not feel a mass. There is no pressure symptoms. She's had intermittent hoarseness this spring which he thinks is due to pollen. Her voice is basically  strong.  Surveillance PET/CT recently showed hypermetabolic uptake in the left thyroid lobe. Ultrasound showed multiple nodules of both lobes largest being 1.4 cm. Ultrasound-guided biopsy of a left thyroid nodule confirmed papillary  thyroid cancer. There is no family history of multiple endocrine neoplasia. Patient denies any history of radiation therapy to the neck. She wants to have a thyroidectomy at the earliest convenience.  HPI  Past Medical History  Diagnosis Date  . Hypertension   . Arthritis   . Subarachnoid hemorrhage 1996  . Diverticulitis   . Non Hodgkin's lymphoma     Non-Hodgkin's B-Cell Lymphoma    . Atrial fibrillation   . Cancer     Thyroid/Colon/ B cell Lymphoma-remission    Past Surgical History  Procedure Date  . Appendectomy   . Cholecystectomy     S/P Lap Cholecystectomy  . Knee surgery     S/P  Arthroscopic Left knee surgery  . Tuboplasty / tubotubal anastomosis   . Subdural hematoma evacuation via craniotomy   . Rotator cuff repair 1998    Family History  Problem Relation Age of Onset  . Kidney cancer Sister   . Lung cancer Brother   . Cancer Brother     Lung    Social History History  Substance Use Topics  . Smoking status: Former Smoker -- 1.0 packs/day for 12 years    Types: Cigarettes    Quit date: 08/23/1969  . Smokeless tobacco: Never Used  . Alcohol Use: Yes     1 manhattens per night    No Known Allergies  Current Outpatient Prescriptions  Medication Sig Dispense Refill  . carvedilol (COREG) 6.25 MG tablet Take 6.25 mg by mouth 2 (two) times daily with a meal.        . citalopram (CELEXA) 10 MG tablet Take 10 mg by mouth daily.      Marland Kitchen diltiazem (TIAZAC) 120 MG 24 hr capsule Take 120 mg by mouth daily.      . diphenhydrAMINE (BENADRYL) 25 MG tablet Take 25 mg by mouth as needed. itching      . loratadine (CLARITIN)  10 MG tablet Take 10 mg by mouth daily.      Marland Kitchen losartan (COZAAR) 100 MG tablet Take 100 mg by mouth daily.        . Rivaroxaban (XARELTO) 20 MG TABS Take 1 tablet by mouth daily.        Review of Systems Review of Systems  Constitutional: Negative for fever, chills and unexpected weight change.  HENT: Negative for hearing loss, congestion, sore throat, trouble swallowing and voice change.   Eyes: Negative for visual disturbance.  Respiratory: Negative for cough and wheezing.   Cardiovascular: Negative for chest pain, palpitations and leg swelling.  Gastrointestinal: Negative for nausea, vomiting, abdominal pain, diarrhea, constipation, blood in stool, abdominal distention and anal bleeding.  Genitourinary: Negative for  hematuria, vaginal bleeding and difficulty urinating.  Musculoskeletal: Negative for arthralgias.  Skin: Negative for rash and wound.  Neurological: Negative for seizures, syncope and headaches.  Hematological: Negative for adenopathy. Does not bruise/bleed easily.  Psychiatric/Behavioral: Negative for confusion.    Blood pressure 122/68, pulse 48, temperature 97.8 F (36.6 C), temperature source Temporal, resp. rate 14, height 5' 8.25" (1.734 m), weight 204 lb 12.8 oz (92.897 kg).  Physical Exam Physical Exam  Constitutional: She is oriented to person, place, and time. She appears well-developed and well-nourished. No distress.  HENT:  Head: Normocephalic and atraumatic.  Nose: Nose normal.  Mouth/Throat: No oropharyngeal exudate.  Eyes: Conjunctivae and EOM are normal. Pupils are equal, round, and reactive to light. Left eye exhibits no discharge. No scleral icterus.  Neck: Neck supple. No JVD present. No tracheal deviation present. No thyromegaly present.       I do not feel a mass in the thyroid gland. I do not feel any adenopathy.  Cardiovascular: Normal rate, regular rhythm, normal heart sounds and intact distal pulses.   No murmur heard. Pulmonary/Chest: Effort normal and breath sounds normal. No respiratory distress. She has no wheezes. She has no rales. She exhibits no tenderness.  Abdominal: Soft. Bowel sounds are normal. She exhibits no distension and no mass. There is no tenderness. There is no rebound and no guarding.       Well healed right transverse incision. No hernia. No mass.  Musculoskeletal: She exhibits no edema and no tenderness.  Lymphadenopathy:    She has no cervical adenopathy.  Neurological: She is alert and oriented to person, place, and time. She exhibits normal muscle tone. Coordination normal.  Skin: Skin is warm. No rash noted. She is not diaphoretic. No erythema. No pallor.  Psychiatric: She has a normal mood and affect. Her behavior is normal.  Judgment and thought content normal.    Data Reviewed I have reviewed Dr. Lonell Face office notes, PET scan, ultrasound, cytopathology report and past historical details.  Assessment    Papillary thyroid carcinoma, left thyroid lobe in the setting of the multinodular goiter.  History of DVT and pulmonary embolism, diagnosed December 2012  History transient atrial fibrillation, currently in sinus rhythm, on Xarelto and followed by Dr. Verdis Prime  History right colectomy for stage T3, N0 cancer of the cecum. Date of surgery October 19, 2011. 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  Hypertension  History craniotomy for subdural hematoma 1990 following head injury  History spontaneous subarachnoid hemorrhage 1996 with negative workup or aneurysm.    Plan    Scheduled for total thyroidectomy.  I have discussed indications details and risks of the surgery with  the patient and her husband. Risks that I have discussed include but are not limited to bleeding, infection, temporary or permanent hoarseness and recurrent laryngeal nerve injury, temporary or permanent hypocalcemia and parathyroid injury, recurrence of the cancer, cardiac pulmonary and thromboembolic problems. She understands these issues. Her questions were answered. She agrees with this plan.  We will contact Dr.  Verdis Prime and discontinue the Xarelto 5 days preop if he approves of that.       Angelia Mould. Derrell Lolling, M.D., [facility] General and Minimally invasive Surgery Breast and Colorectal Surgery Office:   [phone] Pager:   [phone]  05/01/2012, 4:27 PM

## 2012-05-01 NOTE — Patient Instructions (Signed)
You have papillary carcinoma of the thyroid gland. This is confirmed by needle biopsy of one of the nodules in the left thyroid lobe. You have multiple other nodules which may or may not contain cancer. There is no clinical evidence that this has spread beyond the thyroid gland at this time, although sometimes it will spread to lymph nodes.  You will be scheduled for a total thyroidectomy at [facility] in the near future.  We will check with Dr. Verdis Prime about stopping your Xarelto  Anticoagulant prior to surgery.    Thyroid Cancer  The thyroid gland is a butterfly-shaped gland in the middle of the neck, located just below the voice box. It makes thyroid hormone. Thyroid hormone has an effect on nearly all tissues of your body by regulating your metabolism. Metabolism is the breakdown and use of food that you eat or energy that is stored in your body. Your metabolism affects your heart rate, blood pressure, body temperature, and weight. Thyroid cancer occurs when cells in your thyroid gland begin to form abnormally (mutate). This mutation allows the abnormally formed cells to grow and multiply rapidly. These abnormal cells do not die as normal cells do. The accumulation of these abnormal cells forms a cancerous tumor.  There are 4 main types of thyroid cancer: 1. Papillary cancer. This is the least harmful type of thyroid cancer. Typically, it affects women of child-bearing age. It can be caused by exposure to radiation.  2. Follicular cancer. Follicular cancer accounts for about 20% of all thyroid cancers and is more likely to recur (come back after treatment) and spread.  3. Medullary cancer. Although most thyroid cancers are not passed down through the genes of family members (genetic), this type of thyroid cancer can be inherited. If your family members have this gene for this cancer, you can be tested for it. This cancer develops in most people who have this gene.  4. Anaplastic  cancer. This is the rarest and most harmful (malignant) type of thyroid cancer. It spreads quickly to structures such as your windpipe (trachea), causing compression of your trachea and breathing difficulties. It is such an aggressive type of cancer that it is difficult to completely get rid of. Anaplastic thyroid cancer is more common in people 26 years old or older.  RISK FACTORS  Radiation exposure or radiation treatments to your head and neck during infancy or childhood.   Enlarged thyroid.   Family history of thyroid disease.   Female sex.   Asian race.  SYMPTOMS   Enlargement of your thyroid gland or lumps or swelling in your neck.   Hoarseness or a change in your voice.   Cough or coughing up blood.   Difficulty swallowing.   Shortness of breath if your trachea is compressed.  DIAGNOSIS  Your caregiver will physically examine your thyroid. If a lump is found, an ultrasound test may be done. Your caregiver may order blood tests to see if a lump is causing your thyroid to make too much thyroid hormone. Sometimes a biopsy is performed. This is the removal of a small piece of tissue for examination under a microscope by a specialist Psychologist, educational). This is often done with a very fine needle. The pathologist can tell if the tissue sample contains cancer. TREATMENT  Some types of thyroid cancer grow faster than others. The success of treatment depends on the type of thyroid cancer, whether it has spread to other parts of the body, and the patient's age  and overall health. Most thyroid cancers can be treated successfully. Typically, removal of most or all of the thyroid gland (thyroidectomy) is the course of treatment for most thyroid cancers. In some cases it is necessary to remove lymph nodes in the neck that are close to the thyroid. Often, follow-up procedures are necessary to ensure that all the cancer is eliminated and to keep it from recurring. These procedures include:   Thyroid  hormone therapy. This can be done by taking a pill. This medication serves 2 purposes:   It replaces the hormone in your body that is normally made by your thyroid.   It suppresses a thyroid stimulating hormone, a hormone that activates your thyroid but also makes any remaining cancer cells grow.   Radioactive iodine treatment. This treatment often is used to destroy any remaining thyroid tissue and cancerous tissue that could not be seen and was not removed during the thyroidectomy. This treatment also may be used to treat thyroid cancer that has spread to other parts of the body or has recurred.   External radiation. This typically is used to treat thyroid cancer that has spread to your bones. The treatment is administered a few minutes at a time, 5 days per week, for 5 to 6 weeks.  PREVENTION  In some people who have the gene linked to medullary cancer, thyroidectomy is done to prevent cancer from developing. Document Released: 10/29/2004 Document Revised: 11/25/2011 Document Reviewed: 02/11/2011 [facility] Patient Information 2012 [place], [place].

## 2012-05-02 ENCOUNTER — Telehealth (INDEPENDENT_AMBULATORY_CARE_PROVIDER_SITE_OTHER): Payer: Self-pay | Admitting: General Surgery

## 2012-05-02 ENCOUNTER — Other Ambulatory Visit (INDEPENDENT_AMBULATORY_CARE_PROVIDER_SITE_OTHER): Payer: Self-pay | Admitting: General Surgery

## 2012-05-09 ENCOUNTER — Telehealth (INDEPENDENT_AMBULATORY_CARE_PROVIDER_SITE_OTHER): Payer: Self-pay

## 2012-05-09 NOTE — Telephone Encounter (Signed)
Received clearance from Dr Katrinka Blazing. Face sheet to surgery schedulers and copy to file. Orig. To scan.

## 2012-05-10 ENCOUNTER — Encounter (HOSPITAL_COMMUNITY): Payer: Self-pay | Admitting: Pharmacy Technician

## 2012-05-17 ENCOUNTER — Encounter (HOSPITAL_COMMUNITY): Payer: Self-pay

## 2012-05-17 ENCOUNTER — Inpatient Hospital Stay (HOSPITAL_COMMUNITY): Admission: RE | Admit: 2012-05-17 | Discharge: 2012-05-17 | Payer: Medicare Other | Source: Ambulatory Visit

## 2012-05-17 HISTORY — DX: Personal history of other diseases of the circulatory system: Z86.79

## 2012-05-17 NOTE — Pre-Procedure Instructions (Signed)
[name]  05/17/2012   Your procedure is scheduled on:  June 4  Report to [facility] at 08:00 AM.  Call this number if you have problems the morning of surgery: [phone]   Remember:   Do not eat food:After Midnight.  May have clear liquids: up to 4 Hours before arrival.  Clear liquids include soda, tea, black coffee, apple or grape juice, broth.  Take these medicines the morning of surgery with A SIP OF WATER: Coreg, Diltiazem, Celexa    Do not wear jewelry, make-up or nail polish.  Do not wear lotions, powders, or perfumes. You may wear deodorant.  Do not shave 48 hours prior to surgery. Men may shave face and neck.  Do not bring valuables to the hospital.  Contacts, dentures or bridgework may not be worn into surgery.  Leave suitcase in the car. After surgery it may be brought to your room.  For patients admitted to the hospital, checkout time is 11:00 AM the day of discharge.   Special Instructions: CHG Shower Use Special Wash: 1/2 bottle night before surgery and 1/2 bottle morning of surgery.   Please read over the following fact sheets that you were given: Pain Booklet, Coughing and Deep Breathing and Surgical Site Infection Prevention

## 2012-05-17 NOTE — Progress Notes (Signed)
Patient told to stop Xarelto today per Dr. Jacinto Halim note

## 2012-05-19 ENCOUNTER — Telehealth (INDEPENDENT_AMBULATORY_CARE_PROVIDER_SITE_OTHER): Payer: Self-pay

## 2012-05-19 ENCOUNTER — Encounter (HOSPITAL_COMMUNITY)
Admission: RE | Admit: 2012-05-19 | Discharge: 2012-05-19 | Disposition: A | Discharge status: Home / Self Care | Payer: Medicare Other | Source: Ambulatory Visit | Attending: General Surgery | Admitting: General Surgery

## 2012-05-19 ENCOUNTER — Encounter (HOSPITAL_COMMUNITY): Payer: Self-pay

## 2012-05-19 HISTORY — DX: Other pulmonary embolism without acute cor pulmonale: I26.99

## 2012-05-19 LAB — COMPREHENSIVE METABOLIC PANEL
BUN: 16 mg/dL (ref 6–23)
CO2: 28 mEq/L (ref 19–32)
Calcium: 9.7 mg/dL (ref 8.4–10.5)
Creatinine, Ser: 1.05 mg/dL (ref 0.50–1.10)
GFR calc Af Amer: 59 mL/min — ABNORMAL LOW (ref >90)
GFR calc non Af Amer: 51 mL/min — ABNORMAL LOW (ref >90)
Glucose, Bld: 126 mg/dL — ABNORMAL HIGH (ref 70–99)

## 2012-05-19 LAB — DIFFERENTIAL
Basophils Absolute: 0 10*3/uL (ref 0.0–0.1)
Eosinophils Relative: 0 % (ref 0–5)
Lymphocytes Relative: 15 % (ref 12–46)
Neutro Abs: 13.3 10*3/uL — ABNORMAL HIGH (ref 1.7–7.7)
Neutrophils Relative %: 74 % (ref 43–77)

## 2012-05-19 LAB — URINALYSIS, ROUTINE W REFLEX MICROSCOPIC
Ketones, ur: 15 mg/dL — AB
Nitrite: POSITIVE — AB
pH: 5.5 (ref 5.0–8.0)

## 2012-05-19 LAB — URINE MICROSCOPIC-ADD ON

## 2012-05-19 LAB — CBC
Hemoglobin: 14.3 g/dL (ref 12.0–15.0)
MCH: 32.9 pg (ref 26.0–34.0)
MCHC: 34.6 g/dL (ref 30.0–36.0)
MCV: 94.9 fL (ref 78.0–100.0)
RBC: 4.35 MIL/uL (ref 3.87–5.11)

## 2012-05-19 LAB — T4, FREE: Free T4: 1.07 ng/dL (ref 0.80–1.80)

## 2012-05-19 NOTE — Telephone Encounter (Signed)
Pt advised of possible UTI. Per Dr Jacinto Halim request cipro 500mg  #12 one po q 12 h called to [facility] [phone]. Pt aware.

## 2012-05-19 NOTE — H&P (Signed)
Tracy Mclaughlin    MRN: [id number]   Description: 78 year old female  Provider: [name], MD  Department: [facility]     Diagnoses     Primary thyroid cancer   - Primary    193        Vitals     BP Pulse Temp(Src) Resp Ht Wt    122/68  48  97.8 F (36.6 C) (Temporal)  14  5' 8.25" (1.734 m)  204 lb 12.8 oz (92.897 kg)     BMI - 30.91 kg/m2                History and Physical   [name], MD   Patient ID: Tracy Mclaughlin, female   DOB: 1934-04-25, 78 y.o.   MRN: [id number]            HPI Tracy Mclaughlin is a 78 y.o. female.  She is referred by Dr. Thalia Party for management of a newly diagnosed papillary carcinoma of the thyroid.  This very pleasant woman and her husband live in [place], [place]. She is a retired Engineer, civil (consulting). 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 Dalene Carrow for this. She has been treated with Rituxan and will continue to receive that in the future.  She underwent right colectomy on October 19, 2011 in [place],  [place] for a T3 N0 cancer of the cecum. 0/16 nodes were harvested. She did very well from that surgery without any problems. She is being followed with surveillance only for that.  She has had one episode of atrial fibrillation in the past.  She was diagnosed with deep venous thrombosis and pulmonary embolism in December 2012.  In terms of her thyroid gland she is basically asymptomatic. She does not feel a mass. There is no pressure symptoms. She's had intermittent hoarseness this spring which he thinks is due to pollen. Her voice is basically  strong.  Surveillance PET/CT recently showed hypermetabolic uptake in the left thyroid lobe. Ultrasound showed multiple nodules of both lobes largest being 1.4 cm. Ultrasound-guided biopsy of a left thyroid nodule confirmed papillary  thyroid cancer. There is no  family history of multiple endocrine neoplasia. Patient denies any history of radiation therapy to the neck. She wants to have a thyroidectomy at the earliest convenience.       Past Medical History   Diagnosis  Date   .  Hypertension     .  Arthritis     .  Subarachnoid hemorrhage  1996   .  Diverticulitis     .  Non Hodgkin's lymphoma         Non-Hodgkin's B-Cell Lymphoma   .  Atrial fibrillation     .  Cancer         Thyroid/Colon/ B cell Lymphoma-remission       Past Surgical History   Procedure  Date   .  Appendectomy     .  Cholecystectomy         S/P Lap Cholecystectomy   .  Knee surgery         S/P  Arthroscopic Left knee surgery   .  Tuboplasty / tubotubal anastomosis     .  Subdural hematoma evacuation via craniotomy     .  Rotator cuff repair  1998       Family History  Problem  Relation  Age of Onset   .  Kidney cancer  Sister     .  Lung cancer  Brother     .  Cancer  Brother         Lung      Social History History   Substance Use Topics   .  Smoking status:  Former Smoker -- 1.0 packs/day for 12 years       Types:  Cigarettes       Quit date:  08/23/1969   .  Smokeless tobacco:  Never Used   .  Alcohol Use:  Yes         1 manhattens per night      No Known Allergies    Current Outpatient Prescriptions   Medication  Sig  Dispense  Refill   .  carvedilol (COREG) 6.25 MG tablet  Take 6.25 mg by mouth 2 (two) times daily with a meal.           .  citalopram (CELEXA) 10 MG tablet  Take 10 mg by mouth daily.         Marland Kitchen  diltiazem (TIAZAC) 120 MG 24 hr capsule  Take 120 mg by mouth daily.         .  diphenhydrAMINE (BENADRYL) 25 MG tablet  Take 25 mg by mouth as needed. itching         .  loratadine (CLARITIN) 10 MG tablet  Take 10 mg by mouth daily.         Marland Kitchen  losartan (COZAAR) 100 MG tablet  Take 100 mg by mouth daily.           .  Rivaroxaban (XARELTO) 20 MG TABS  Take 1 tablet by mouth daily.            Review of Systems  Constitutional:  Negative for fever, chills and unexpected weight change.  HENT: Negative for hearing loss, congestion, sore throat, trouble swallowing and voice change.   Eyes: Negative for visual disturbance.  Respiratory: Negative for cough and wheezing.   Cardiovascular: Negative for chest pain, palpitations and leg swelling.  Gastrointestinal: Negative for nausea, vomiting, abdominal pain, diarrhea, constipation, blood in stool, abdominal distention and anal bleeding.  Genitourinary: Negative for hematuria, vaginal bleeding and difficulty urinating.  Musculoskeletal: Negative for arthralgias.  Skin: Negative for rash and wound.  Neurological: Negative for seizures, syncope and headaches.  Hematological: Negative for adenopathy. Does not bruise/bleed easily.  Psychiatric/Behavioral: Negative for confusion.    Blood pressure 122/68, pulse 48, temperature 97.8 F (36.6 C), temperature source Temporal, resp. rate 14, height 5' 8.25" (1.734 m), weight 204 lb 12.8 oz (92.897 kg).   Physical Exam   Constitutional: She is oriented to person, place, and time. She appears well-developed and well-nourished. No distress.  HENT:   Head: Normocephalic and atraumatic.   Nose: Nose normal.   Mouth/Throat: No oropharyngeal exudate.  Eyes: Conjunctivae and EOM are normal. Pupils are equal, round, and reactive to light. Left eye exhibits no discharge. No scleral icterus.  Neck: Neck supple. No JVD present. No tracheal deviation present. No thyromegaly present.       I do not feel a mass in the thyroid gland. I do not feel any adenopathy.  Cardiovascular: Normal rate, regular rhythm, normal heart sounds and intact distal pulses.    No murmur heard. Pulmonary/Chest: Effort normal and breath sounds normal. No respiratory distress. She has no wheezes. She has no rales. She exhibits  no tenderness.  Abdominal: Soft. Bowel sounds are normal. She exhibits no distension and no mass. There is no tenderness. There is no  rebound and no guarding.       Well healed right transverse incision. No hernia. No mass.  Musculoskeletal: She exhibits no edema and no tenderness.  Lymphadenopathy:    She has no cervical adenopathy.  Neurological: She is alert and oriented to person, place, and time. She exhibits normal muscle tone. Coordination normal.  Skin: Skin is warm. No rash noted. She is not diaphoretic. No erythema. No pallor.  Psychiatric: She has a normal mood and affect. Her behavior is normal. Judgment and thought content normal.    .   Assessment   Papillary thyroid carcinoma, left thyroid lobe in the setting of the multinodular goiter.  History of DVT and pulmonary embolism, diagnosed December 2012  History transient atrial fibrillation, currently in sinus rhythm, on Xarelto and followed by Dr. Verdis Prime  History right colectomy for stage T3, N0 cancer of the cecum. Date of surgery October 19, 2011. 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  Hypertension  History craniotomy for subdural hematoma 1990 following head injury  History spontaneous subarachnoid hemorrhage 1996 with negative workup or aneurysm.   Plan  Scheduled for total thyroidectomy.  I have discussed indications details and risks of the surgery with the patient and her husband. Risks that I have discussed include but are not limited to bleeding, infection, temporary or permanent hoarseness and recurrent laryngeal nerve injury, temporary or permanent hypocalcemia and parathyroid injury, recurrence of the cancer, cardiac pulmonary and thromboembolic problems. She understands these issues. Her questions were answered. She agrees with this plan.  We will contact Dr.  Verdis Prime and discontinue the Xarelto 5 days preop if he approves of that.       Angelia Mould. Derrell Lolling, M.D., [facility] General and Minimally  invasive Surgery Breast and Colorectal Surgery Office:   [phone] Pager:   [phone]

## 2012-05-19 NOTE — Progress Notes (Signed)
Requested CXR from [facility]

## 2012-05-19 NOTE — Telephone Encounter (Signed)
She wanted to make [place] aware of the CBC results and the UA which are abnormal.  Surgery is Monday

## 2012-05-22 MED ORDER — HEPARIN SODIUM (PORCINE) 5000 UNIT/ML IJ SOLN
5000.0000 [IU] | Freq: Once | INTRAMUSCULAR | Status: AC
  Administered 2012-05-23: 5000 [IU] via SUBCUTANEOUS
  Filled 2012-05-22: qty 1

## 2012-05-22 MED ORDER — CHLORHEXIDINE GLUCONATE 4 % EX LIQD: 1.0000 "application " | Freq: Once | CUTANEOUS | Status: DC

## 2012-05-22 MED ORDER — CEFAZOLIN SODIUM-DEXTROSE 2-3 GM-% IV SOLR
2.0000 g | INTRAVENOUS | Status: AC
  Administered 2012-05-23: 2 g via INTRAVENOUS
  Filled 2012-05-22: qty 50

## 2012-05-23 ENCOUNTER — Ambulatory Visit (HOSPITAL_COMMUNITY)
Admission: RE | Admit: 2012-05-23 | Discharge: 2012-05-24 | Disposition: A | Discharge status: Home / Self Care | Payer: Medicare Other | Source: Ambulatory Visit | Attending: General Surgery | Admitting: General Surgery

## 2012-05-23 ENCOUNTER — Ambulatory Visit (HOSPITAL_COMMUNITY): Payer: Medicare Other | Admitting: Certified Registered"

## 2012-05-23 ENCOUNTER — Encounter (HOSPITAL_COMMUNITY): Payer: Self-pay | Admitting: Certified Registered"

## 2012-05-23 ENCOUNTER — Encounter (HOSPITAL_COMMUNITY)
Admission: RE | Disposition: A | Discharge status: Home / Self Care | Payer: Self-pay | Source: Ambulatory Visit | Attending: General Surgery

## 2012-05-23 DIAGNOSIS — C8589 Other specified types of non-Hodgkin lymphoma, extranodal and solid organ sites: Secondary | ICD-10-CM | POA: Insufficient documentation

## 2012-05-23 DIAGNOSIS — I10 Essential (primary) hypertension: Secondary | ICD-10-CM | POA: Insufficient documentation

## 2012-05-23 DIAGNOSIS — C73 Malignant neoplasm of thyroid gland: Secondary | ICD-10-CM | POA: Insufficient documentation

## 2012-05-23 DIAGNOSIS — Z86718 Personal history of other venous thrombosis and embolism: Secondary | ICD-10-CM | POA: Insufficient documentation

## 2012-05-23 DIAGNOSIS — C8307 Small cell B-cell lymphoma, spleen: Secondary | ICD-10-CM

## 2012-05-23 DIAGNOSIS — Z01812 Encounter for preprocedural laboratory examination: Secondary | ICD-10-CM | POA: Insufficient documentation

## 2012-05-23 DIAGNOSIS — R634 Abnormal weight loss: Secondary | ICD-10-CM

## 2012-05-23 DIAGNOSIS — C189 Malignant neoplasm of colon, unspecified: Secondary | ICD-10-CM

## 2012-05-23 DIAGNOSIS — Z923 Personal history of irradiation: Secondary | ICD-10-CM | POA: Insufficient documentation

## 2012-05-23 HISTORY — PX: THYROIDECTOMY: SHX17

## 2012-05-23 LAB — BASIC METABOLIC PANEL
CO2: 28 mEq/L (ref 19–32)
Chloride: 104 mEq/L (ref 96–112)
Creatinine, Ser: 0.89 mg/dL (ref 0.50–1.10)
GFR calc Af Amer: 72 mL/min — ABNORMAL LOW (ref >90)
Potassium: 4.7 mEq/L (ref 3.5–5.1)
Sodium: 141 mEq/L (ref 135–145)

## 2012-05-23 SURGERY — THYROIDECTOMY
Anesthesia: General | Site: Neck | Wound class: Clean

## 2012-05-23 MED ORDER — LIDOCAINE HCL 4 % MT SOLN
OROMUCOSAL | Status: DC | PRN
  Administered 2012-05-23: 4 mL via TOPICAL

## 2012-05-23 MED ORDER — DIPHENHYDRAMINE HCL 25 MG PO TABS
25.0000 mg | ORAL_TABLET | Freq: Every evening | ORAL | Status: DC | PRN
  Administered 2012-05-24: 25 mg via ORAL
  Filled 2012-05-23: qty 1

## 2012-05-23 MED ORDER — HEMOSTATIC AGENTS (NO CHARGE) OPTIME
TOPICAL | Status: DC | PRN
  Administered 2012-05-23: 1 via TOPICAL

## 2012-05-23 MED ORDER — PHENYLEPHRINE HCL 10 MG/ML IJ SOLN
INTRAMUSCULAR | Status: DC | PRN
  Administered 2012-05-23: 120 ug via INTRAVENOUS

## 2012-05-23 MED ORDER — FENTANYL CITRATE 0.05 MG/ML IJ SOLN
INTRAMUSCULAR | Status: DC | PRN
  Administered 2012-05-23: 100 ug via INTRAVENOUS
  Administered 2012-05-23: 50 ug via INTRAVENOUS
  Administered 2012-05-23: 100 ug via INTRAVENOUS
  Administered 2012-05-23: 50 ug via INTRAVENOUS

## 2012-05-23 MED ORDER — LACTATED RINGERS IV SOLN
INTRAVENOUS | Status: DC
  Administered 2012-05-23 (×2): via INTRAVENOUS

## 2012-05-23 MED ORDER — LORATADINE 10 MG PO TABS
10.0000 mg | ORAL_TABLET | ORAL | Status: DC | PRN
  Filled 2012-05-23: qty 1

## 2012-05-23 MED ORDER — GLYCOPYRROLATE 0.2 MG/ML IJ SOLN
INTRAMUSCULAR | Status: DC | PRN
  Administered 2012-05-23 (×3): 0.2 mg via INTRAVENOUS

## 2012-05-23 MED ORDER — CARVEDILOL 6.25 MG PO TABS
6.2500 mg | ORAL_TABLET | Freq: Two times a day (BID) | ORAL | Status: DC
  Administered 2012-05-24: 6.25 mg via ORAL
  Filled 2012-05-23 (×5): qty 1

## 2012-05-23 MED ORDER — HYDROMORPHONE HCL PF 1 MG/ML IJ SOLN
INTRAMUSCULAR | Status: AC
  Filled 2012-05-23: qty 1

## 2012-05-23 MED ORDER — CITALOPRAM HYDROBROMIDE 10 MG PO TABS
10.0000 mg | ORAL_TABLET | Freq: Every day | ORAL | Status: DC
  Administered 2012-05-24: 10 mg via ORAL
  Filled 2012-05-23 (×2): qty 1

## 2012-05-23 MED ORDER — ONDANSETRON HCL 4 MG/2ML IJ SOLN: 4.0000 mg | Freq: Four times a day (QID) | INTRAMUSCULAR | Status: DC | PRN

## 2012-05-23 MED ORDER — 0.9 % SODIUM CHLORIDE (POUR BTL) OPTIME
TOPICAL | Status: DC | PRN
  Administered 2012-05-23: 1000 mL

## 2012-05-23 MED ORDER — ONDANSETRON HCL 4 MG PO TABS: 4.0000 mg | ORAL_TABLET | Freq: Four times a day (QID) | ORAL | Status: DC | PRN

## 2012-05-23 MED ORDER — ONDANSETRON HCL 4 MG/2ML IJ SOLN
INTRAMUSCULAR | Status: DC | PRN
  Administered 2012-05-23: 4 mg via INTRAVENOUS

## 2012-05-23 MED ORDER — HYDROMORPHONE HCL PF 1 MG/ML IJ SOLN
0.2500 mg | INTRAMUSCULAR | Status: DC | PRN
  Administered 2012-05-23: 0.25 mg via INTRAVENOUS

## 2012-05-23 MED ORDER — MIDAZOLAM HCL 5 MG/5ML IJ SOLN
INTRAMUSCULAR | Status: DC | PRN
  Administered 2012-05-23: 2 mg via INTRAVENOUS

## 2012-05-23 MED ORDER — NEOSTIGMINE METHYLSULFATE 1 MG/ML IJ SOLN
INTRAMUSCULAR | Status: DC | PRN
  Administered 2012-05-23: 2 mg via INTRAVENOUS

## 2012-05-23 MED ORDER — ROCURONIUM BROMIDE 100 MG/10ML IV SOLN
INTRAVENOUS | Status: DC | PRN
  Administered 2012-05-23: 40 mg via INTRAVENOUS

## 2012-05-23 MED ORDER — PROPOFOL 10 MG/ML IV EMUL
INTRAVENOUS | Status: DC | PRN
  Administered 2012-05-23: 130 mg via INTRAVENOUS

## 2012-05-23 MED ORDER — POTASSIUM CHLORIDE IN NACL 20-0.9 MEQ/L-% IV SOLN
INTRAVENOUS | Status: DC
  Administered 2012-05-23 – 2012-05-24 (×2): via INTRAVENOUS
  Filled 2012-05-23 (×4): qty 1000

## 2012-05-23 MED ORDER — MORPHINE SULFATE 2 MG/ML IJ SOLN
2.0000 mg | INTRAMUSCULAR | Status: DC | PRN
  Administered 2012-05-23: 2 mg via INTRAVENOUS
  Filled 2012-05-23: qty 1

## 2012-05-23 MED ORDER — ONDANSETRON HCL 4 MG/2ML IJ SOLN
4.0000 mg | Freq: Once | INTRAMUSCULAR | Status: AC | PRN
  Administered 2012-05-23: 4 mg via INTRAVENOUS

## 2012-05-23 MED ORDER — BUPIVACAINE-EPINEPHRINE PF 0.5-1:200000 % IJ SOLN
INTRAMUSCULAR | Status: DC | PRN
  Administered 2012-05-23: 7 mL

## 2012-05-23 MED ORDER — LOSARTAN POTASSIUM 50 MG PO TABS
100.0000 mg | ORAL_TABLET | Freq: Every day | ORAL | Status: DC
  Administered 2012-05-23 – 2012-05-24 (×2): 100 mg via ORAL
  Filled 2012-05-23 (×2): qty 2

## 2012-05-23 MED ORDER — LIDOCAINE HCL (CARDIAC) 20 MG/ML IV SOLN
INTRAVENOUS | Status: DC | PRN
  Administered 2012-05-23: 70 mg via INTRAVENOUS

## 2012-05-23 MED ORDER — HEPARIN SODIUM (PORCINE) 5000 UNIT/ML IJ SOLN
5000.0000 [IU] | Freq: Three times a day (TID) | INTRAMUSCULAR | Status: DC
  Administered 2012-05-23 – 2012-05-24 (×2): 5000 [IU] via SUBCUTANEOUS
  Filled 2012-05-23 (×5): qty 1

## 2012-05-23 MED ORDER — HYDROCODONE-ACETAMINOPHEN 5-325 MG PO TABS
1.0000 | ORAL_TABLET | ORAL | Status: DC | PRN
  Administered 2012-05-23: 2 via ORAL
  Filled 2012-05-23: qty 2

## 2012-05-23 MED ORDER — DILTIAZEM HCL ER BEADS 120 MG PO CP24
120.0000 mg | ORAL_CAPSULE | Freq: Every day | ORAL | Status: DC
  Administered 2012-05-24: 120 mg via ORAL
  Filled 2012-05-23 (×2): qty 1

## 2012-05-23 MED ORDER — EPHEDRINE SULFATE 50 MG/ML IJ SOLN
INTRAMUSCULAR | Status: DC | PRN
  Administered 2012-05-23: 5 mg via INTRAVENOUS
  Administered 2012-05-23: 15 mg via INTRAVENOUS
  Administered 2012-05-23: 5 mg via INTRAVENOUS
  Administered 2012-05-23: 10 mg via INTRAVENOUS

## 2012-05-23 MED ORDER — CIPROFLOXACIN HCL 500 MG PO TABS
500.0000 mg | ORAL_TABLET | Freq: Two times a day (BID) | ORAL | Status: DC
  Administered 2012-05-23 – 2012-05-24 (×2): 500 mg via ORAL
  Filled 2012-05-23 (×3): qty 1

## 2012-05-23 SURGICAL SUPPLY — 52 items
ATTRACTOMAT 16X20 MAGNETIC DRP (DRAPES) ×2 IMPLANT
BENZOIN TINCTURE PRP APPL 2/3 (GAUZE/BANDAGES/DRESSINGS) ×2 IMPLANT
BLADE SURG 10 STRL SS (BLADE) ×2 IMPLANT
BLADE SURG 15 STRL LF DISP TIS (BLADE) ×1 IMPLANT
BLADE SURG 15 STRL SS (BLADE) ×1
CANISTER SUCTION 2500CC (MISCELLANEOUS) ×2 IMPLANT
CHLORAPREP W/TINT 26ML (MISCELLANEOUS) ×2 IMPLANT
CLIP TI MEDIUM 24 (CLIP) ×2 IMPLANT
CLIP TI WIDE RED SMALL 24 (CLIP) ×2 IMPLANT
CLOTH BEACON ORANGE TIMEOUT ST (SAFETY) ×2 IMPLANT
CONT SPEC 4OZ CLIKSEAL STRL BL (MISCELLANEOUS) IMPLANT
COVER SURGICAL LIGHT HANDLE (MISCELLANEOUS) ×2 IMPLANT
CRADLE DONUT ADULT HEAD (MISCELLANEOUS) ×2 IMPLANT
DRAPE PED LAPAROTOMY (DRAPES) ×2 IMPLANT
DRAPE UTILITY 15X26 W/TAPE STR (DRAPE) ×4 IMPLANT
ELECT CAUTERY BLADE 6.4 (BLADE) ×2 IMPLANT
ELECT REM PT RETURN 9FT ADLT (ELECTROSURGICAL) ×2
ELECTRODE REM PT RTRN 9FT ADLT (ELECTROSURGICAL) ×1 IMPLANT
GAUZE SPONGE 4X4 16PLY XRAY LF (GAUZE/BANDAGES/DRESSINGS) ×4 IMPLANT
GLOVE BIOGEL PI IND STRL 7.0 (GLOVE) ×1 IMPLANT
GLOVE BIOGEL PI INDICATOR 7.0 (GLOVE) ×1
GLOVE EUDERMIC 7 POWDERFREE (GLOVE) ×2 IMPLANT
GLOVE SS BIOGEL STRL SZ 6.5 (GLOVE) ×1 IMPLANT
GLOVE SUPERSENSE BIOGEL SZ 6.5 (GLOVE) ×1
GLOVE SURG ORTHO 6.0 STRL STRW (GLOVE) ×2 IMPLANT
GOWN PREVENTION PLUS XLARGE (GOWN DISPOSABLE) ×4 IMPLANT
GOWN STRL NON-REIN LRG LVL3 (GOWN DISPOSABLE) ×4 IMPLANT
HEMOSTAT SURGICEL 2X14 (HEMOSTASIS) IMPLANT
HEMOSTAT SURGICEL 2X4 FIBR (HEMOSTASIS) ×2 IMPLANT
KIT BASIN OR (CUSTOM PROCEDURE TRAY) ×2 IMPLANT
KIT ROOM TURNOVER OR (KITS) ×2 IMPLANT
NS IRRIG 1000ML POUR BTL (IV SOLUTION) ×2 IMPLANT
PACK SURGICAL SETUP 50X90 (CUSTOM PROCEDURE TRAY) ×2 IMPLANT
PAD ARMBOARD 7.5X6 YLW CONV (MISCELLANEOUS) ×4 IMPLANT
PENCIL BUTTON HOLSTER BLD 10FT (ELECTRODE) ×2 IMPLANT
SHEARS HARMONIC 9CM CVD (BLADE) ×2 IMPLANT
SPECIMEN JAR MEDIUM (MISCELLANEOUS) IMPLANT
SPONGE GAUZE 4X4 12PLY (GAUZE/BANDAGES/DRESSINGS) ×2 IMPLANT
SPONGE INTESTINAL PEANUT (DISPOSABLE) ×2 IMPLANT
STAPLER VISISTAT 35W (STAPLE) ×2 IMPLANT
STRIP CLOSURE SKIN 1/2X4 (GAUZE/BANDAGES/DRESSINGS) ×2 IMPLANT
SUT MNCRL AB 4-0 PS2 18 (SUTURE) ×2 IMPLANT
SUT SILK 2 0 (SUTURE) ×1
SUT SILK 2-0 18XBRD TIE 12 (SUTURE) ×1 IMPLANT
SUT SILK 3 0 (SUTURE) ×1
SUT SILK 3-0 18XBRD TIE 12 (SUTURE) ×1 IMPLANT
SUT VIC AB 3-0 SH 18 (SUTURE) ×4 IMPLANT
SYR BULB 3OZ (MISCELLANEOUS) ×2 IMPLANT
TOWEL OR 17X24 6PK STRL BLUE (TOWEL DISPOSABLE) ×2 IMPLANT
TOWEL OR 17X26 10 PK STRL BLUE (TOWEL DISPOSABLE) ×2 IMPLANT
TUBE CONNECTING 12X1/4 (SUCTIONS) ×2 IMPLANT
WATER STERILE IRR 1000ML POUR (IV SOLUTION) ×2 IMPLANT

## 2012-05-23 NOTE — Transfer of Care (Signed)
Immediate Anesthesia Transfer of Care Note  Patient: Tracy Mclaughlin  Procedure(s) Performed: Procedure(s) (LRB): THYROIDECTOMY (N/A)  Patient Location: PACU  Anesthesia Type: General  Level of Consciousness: awake, alert  and oriented  Airway & Oxygen Therapy: Patient Spontanous Breathing and Patient connected to nasal cannula oxygen  Post-op Assessment: Report given to PACU RN  Post vital signs: Reviewed and stable  Complications: No apparent anesthesia complications

## 2012-05-23 NOTE — Anesthesia Postprocedure Evaluation (Signed)
Anesthesia Post Note  Patient: Tracy Mclaughlin  Procedure(s) Performed: Procedure(s) (LRB): THYROIDECTOMY (N/A)  Anesthesia type: general  Patient location: PACU  Post pain: Pain level controlled  Post assessment: Patient's Cardiovascular Status Stable  Last Vitals:  Filed Vitals:   05/23/12 1230  BP: 130/33  Pulse: 67  Temp:   Resp: 11    Post vital signs: Reviewed and stable  Level of consciousness: sedated  Complications: No apparent anesthesia complications

## 2012-05-23 NOTE — Interval H&P Note (Signed)
History and Physical Interval Note:  05/23/2012 9:54 AM  Tracy Mclaughlin  has presented today for surgery, with the diagnosis of thyroid cancer   The goals of treatment and the various methods of treatment have been discussed with the patient and family. After consideration of risks, benefits and other options for treatment, the patient has consented to  Procedure(s) (LRB): THYROIDECTOMY (N/A) as a surgical intervention .  The patients' history has been reviewed and the patient examined, no change in status, stable for surgery.  I have reviewed the patients' chart and labs.  Questions were answered to the patient's satisfaction.     [name]

## 2012-05-23 NOTE — Anesthesia Procedure Notes (Signed)
Procedure Name: Intubation Date/Time: 05/23/2012 10:15 AM Performed by: Jefm Miles E Pre-anesthesia Checklist: Patient identified, Emergency Drugs available, Suction available, Patient being monitored and Timeout performed Patient Re-evaluated:Patient Re-evaluated prior to inductionOxygen Delivery Method: Circle system utilized Preoxygenation: Pre-oxygenation with 100% oxygen Intubation Type: IV induction Ventilation: Mask ventilation without difficulty Laryngoscope Size: Mac and 3 Grade View: Grade I Tube type: Oral Tube size: 7.0 mm Number of attempts: 1 Airway Equipment and Method: Stylet Placement Confirmation: ETT inserted through vocal cords under direct vision,  breath sounds checked- equal and bilateral and positive ETCO2 Secured at: 21 cm Tube secured with: Tape Dental Injury: Teeth and Oropharynx as per pre-operative assessment

## 2012-05-23 NOTE — Anesthesia Preprocedure Evaluation (Addendum)
Anesthesia Evaluation  Patient identified by MRN, date of birth, ID band Patient awake    Reviewed: Allergy & Precautions, H&P , NPO status , Patient's Chart, lab work & pertinent test results, reviewed documented beta blocker date and time   Airway Mallampati: I TM Distance: <3 FB Neck ROM: Full    Dental  (+) Partial Lower, Partial Upper, Dental Advisory Given and Teeth Intact   Pulmonary  breath sounds clear to auscultation        Cardiovascular hypertension, Pt. on medications + dysrhythmias Atrial Fibrillation Rhythm:Regular Rate:Normal     Neuro/Psych    GI/Hepatic   Endo/Other    Renal/GU      Musculoskeletal   Abdominal   Peds  Hematology   Anesthesia Other Findings   Reproductive/Obstetrics                           Anesthesia Physical Anesthesia Plan  ASA: II  Anesthesia Plan: General   Post-op Pain Management:    Induction: Intravenous  Airway Management Planned: Oral ETT  Additional Equipment:   Intra-op Plan:   Post-operative Plan: Extubation in OR  Informed Consent: I have reviewed the patients History and Physical, chart, labs and discussed the procedure including the risks, benefits and alternatives for the proposed anesthesia with the patient or authorized representative who has indicated his/her understanding and acceptance.     Plan Discussed with: CRNA and Surgeon  Anesthesia Plan Comments:         Anesthesia Quick Evaluation

## 2012-05-23 NOTE — Op Note (Signed)
Patient Name:           Tracy Mclaughlin   Date of Surgery:        05/23/2012  Pre op Diagnosis:      Papillary thyroid cancer, possibly multifocal  Post op Diagnosis:    same  Procedure:        Total thyroidectomy, excision left pretracheal lymph node, excision right pretracheal mass, reimplantation left superior parathyroid gland into left sternocleidomastoid muscle           Surgeon:                     Angelia Mould. Derrell Lolling, M.D., FACS  Assistant:                      Darnell Level, M.D., FACS  Operative Indications:   Tracy Mclaughlin is a 78 y.o. female. She was referred by Dr. Thalia Party for management of a newly diagnosed papillary carcinoma of the thyroid.  This very pleasant woman and her husband live in [place], [place]. She is a retired Engineer, civil (consulting). 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 Dalene Carrow for this. She has been treated with Rituxan and will continue to receive that in the future.  She also underwent right colectomy on October 19, 2011 in [place], [place] for a T3 N0 cancer of the cecum. 0/16 nodes were harvested. She did very well from that surgery without any problems. She is being followed with surveillance only for that. She has had one episode of atrial fibrillation in the past.  She was diagnosed with deep venous thrombosis and pulmonary embolism in December 2012.  In terms of her thyroid gland she is basically asymptomatic. She does not feel a mass. There are no pressure symptoms. She's had intermittent hoarseness this spring which he thinks is due to pollen. Her voice is basically strong.  Surveillance PET/CT recently showed hypermetabolic uptake in the left thyroid lobe. Ultrasound showed multiple nodules of both lobes largest being 1.4 cm. Ultrasound-guided biopsy of a left thyroid nodule confirmed papillary thyroid cancer. There is no family history  of multiple endocrine neoplasia. Patient denies any history of radiation therapy to the neck. She wants to have a thyroidectomy at the earliest convenience.she has been counseled as an outpatient. She is brought to the operating room electively   Operative Findings:       There was a 1.5 cm hard whitish nodule in the left thyroid gland which appeared to be completely contained within the gland and was not invasive outside the capsule that I could detect. There were a couple of smaller nodules elsewhere in both lobes. There was what appeared to be an enlarged firm lymph node in the left inferior paratracheal area well below the thyroid gland which was removed. There was a right paratracheal mass well outside the thyroid gland and also inferior to the lower pole thyroid gland which was removed. The right superior and right inferior parathyroid glands were identified. Both recurrent laryngeal nerves were visualized. The left superior parathyroid gland was thought to be devascularized and was removed, cut into small pieces, and reimplanted in the left sternocleidomastoid muscle.  Procedure in Detail:          Following the induction of general endotracheal anesthesia the patient's neck was extended with a roll behind her shoulders in a reverse  Trendelenburg position. The neck was prepped and draped in sterile fashion. Intravenous antibiotics were given. Surgical time out was performed.  A transverse collar incision was made about 2 cm above the suprasternal notch. Dissection was carried down through the platysma muscle and then skin and platysmal muscle flaps were raised and a self-retaining retractor was placed. The strap muscles were divided in the midline identifying the isthmus of the thyroid gland. I dissected left thyroid lobe first. We kept the dissection right in the capsule. We took down the superior pole vessels and divided them between metal clips and the harmonic scalpel. The  lower pole of the  thyroid was mobilized similarly.    I then mobilized the left lobe from lateral to medial. I was able to very easily identify the left recurrent laryngeal nerve and take the dissection up onto the trachea. The left inferior  parathyroid gland appeared intact. There appeared to be a small appendage in the upper left pole which appeared to be a brownish tissue which was thought to possibly be parathyroid tissue. This was cut into 4 small pieces and implanted into the left sternocleidomastoid muscle and the pocket was closed with a figure-of-eight suture of 3-0 Vicryl. There was about a 1 cm nodule in the left paratracheal area just above the suprasternal notch. This was dissected out and it looked like an enlarged lymph node. This was sent as a separate specimen. I saw no other palpable masses on the left side. I then mobilized the right thyroid lobe in similar fashion. The  lower pole vessels were divided. Again the upper pole vessels were divided between metal clips and the harmonic scalpel. We kept this dissection right in the plane of the thyroid capsule and preserved the parathyroid glands. We were able to stay away from the recurrent laryngeal nerve on the right side as well and dissected the thyroid gland off of the trachea. I searched for a pyramidal lobe and there was none. The specimen was removed and the left superior pole was marked with a silk suture. There was enlarged nodule in the right paratracheal area well away from the thyroid and I also carefully dissected it  up and out controlling vessels with Harmonic scalpel. This was sent as a separate specimen.  After performing the thyroidectomy, removing both paratracheal masses in reimplanting the left superior parathyroid gland. The wounds were irrigated.  Hemostasis was excellent. A methylcellulose hemostatic sponge was placed in the wound. There was no bleeding. Strap muscles were closed in the midline. This was done with interrupted 3-0 Vicryl. The  platysma muscles were closed with interrupted 3-0 Vicryl and the skin closed with a running subcuticular suture of 4-0 Monocryl and Steri-Strips. A bandage  was placed. Patient taken to recovery in stable condition. EBL 20 cc or less. Counts correct. Complications none.     Angelia Mould. Derrell Lolling, M.D., FACS General and Minimally Invasive Surgery Breast and Colorectal Surgery  05/23/2012 11:57 AM

## 2012-05-23 NOTE — Progress Notes (Signed)
Speech clear

## 2012-05-23 NOTE — Preoperative (Signed)
Beta Blockers   Reason not to administer Beta Blockers:Not Applicable, last dose 05/23/12 @ 0615

## 2012-05-23 NOTE — Progress Notes (Signed)
Report given to angel rn as caregiver 

## 2012-05-24 ENCOUNTER — Telehealth (INDEPENDENT_AMBULATORY_CARE_PROVIDER_SITE_OTHER): Payer: Self-pay

## 2012-05-24 ENCOUNTER — Encounter (HOSPITAL_COMMUNITY): Payer: Self-pay | Admitting: *Deleted

## 2012-05-24 LAB — BASIC METABOLIC PANEL
CO2: 28 mEq/L (ref 19–32)
Calcium: 8.3 mg/dL — ABNORMAL LOW (ref 8.4–10.5)
Chloride: 104 mEq/L (ref 96–112)
Potassium: 4.5 mEq/L (ref 3.5–5.1)
Sodium: 138 mEq/L (ref 135–145)

## 2012-05-24 MED ORDER — LEVOTHYROXINE SODIUM 50 MCG PO TABS: 50.0000 ug | ORAL_TABLET | Freq: Every day | ORAL | Status: DC

## 2012-05-24 MED ORDER — HYDROCODONE-ACETAMINOPHEN 5-325 MG PO TABS: 1.0000 | ORAL_TABLET | ORAL | Status: AC | PRN

## 2012-05-24 MED ORDER — CALCIUM CARBONATE 1250 (500 CA) MG PO TABS
1.0000 | ORAL_TABLET | Freq: Three times a day (TID) | ORAL | Status: DC
  Administered 2012-05-24: 500 mg via ORAL
  Filled 2012-05-24 (×4): qty 1

## 2012-05-24 NOTE — Progress Notes (Signed)
Discharge home. Home discharge instruction given. 

## 2012-05-24 NOTE — Telephone Encounter (Signed)
Pt home doing well. Po appt made. Advised rx will be mailed to home address for labs to be drawn. Pt requests to have labs drawn approx 6-19 at Dr Holland Falling in [place] as this is close to her home. RX will be mailed on Monday once signed by Dr Derrell Lolling.

## 2012-05-24 NOTE — Progress Notes (Signed)
1 Day Post-Op  Subjective: Doing well. Almost no pain. Voice strong. Swallowing normally. Denies paresthesias or muscle cramping.  Calcium level 8.3 this morning.  Objective: Vital signs in last 24 hours: Temp:  [97 F (36.1 C)-98.9 F (37.2 C)] 97.7 F (36.5 C) (06/05 0518) Pulse Rate:  [47-89] 49  (06/05 0518) Resp:  [9-20] 18  (06/05 0518) BP: (99-152)/(33-81) 99/46 mmHg (06/05 0518) SpO2:  [92 %-100 %] 97 % (06/05 0518) Weight:  [202 lb 6.4 oz (91.808 kg)] 202 lb 6.4 oz (91.808 kg) (06/05 0518) Last BM Date: 05/23/12  Intake/Output from previous day: 06/04 0701 - 06/05 0700 In: 1000 [I.V.:1000] Out: 1550 [Urine:1550] Intake/Output this shift:    General appearance: alert. Good spirits. Voice is strong. Neck: , no tenderness/mass/nodules and thyroidectomy incision clean, dry. Soft. No hematoma. Chvostek's sign negative bilaterally.  Lab Results:  Results for orders placed during the hospital encounter of 05/23/12 (from the past 24 hour(s))  BASIC METABOLIC PANEL     Status: Abnormal   Collection Time   05/23/12  3:13 PM      Component Value Range   Sodium 141  135 - 145 (mEq/L)   Potassium 4.7  3.5 - 5.1 (mEq/L)   Chloride 104  96 - 112 (mEq/L)   CO2 28  19 - 32 (mEq/L)   Glucose, Bld 122 (*) 70 - 99 (mg/dL)   BUN 24 (*) 6 - 23 (mg/dL)   Creatinine, Ser 1.91  0.50 - 1.10 (mg/dL)   Calcium 9.4  8.4 - 47.8 (mg/dL)   GFR calc non Af Amer 62 (*) >90 (mL/min)   GFR calc Af Amer 72 (*) >90 (mL/min)  BASIC METABOLIC PANEL     Status: Abnormal   Collection Time   05/24/12  5:30 AM      Component Value Range   Sodium 138  135 - 145 (mEq/L)   Potassium 4.5  3.5 - 5.1 (mEq/L)   Chloride 104  96 - 112 (mEq/L)   CO2 28  19 - 32 (mEq/L)   Glucose, Bld 102 (*) 70 - 99 (mg/dL)   BUN 18  6 - 23 (mg/dL)   Creatinine, Ser 2.95  0.50 - 1.10 (mg/dL)   Calcium 8.3 (*) 8.4 - 10.5 (mg/dL)   GFR calc non Af Amer 51 (*) >90 (mL/min)   GFR calc Af Amer 59 (*) >90 (mL/min)      Studies/Results: @RISRSLT24 @     . calcium carbonate  1 tablet Oral TID  . carvedilol  6.25 mg Oral BID WC  .  ceFAZolin (ANCEF) IV  2 g Intravenous 60 min Pre-Op  . ciprofloxacin  500 mg Oral BID  . citalopram  10 mg Oral Daily  . diltiazem  120 mg Oral Daily  . heparin  5,000 Units Subcutaneous Once  . heparin  5,000 Units Subcutaneous Q8H  . HYDROmorphone      . losartan  100 mg Oral Daily  . DISCONTD: chlorhexidine  1 application Topical Once     Assessment/Plan: s/p Procedure(s): THYROIDECTOMY  Status post total thyroidectomy with limited neck dissection and reimplantation of left superior parathyroid gland.  Stable postop. No apparent complications. Ready for discharge.  We'll send home on Synthroid 50 mcg per day and calcium carbonate 1500 mg at lunch and 1500 mg at supper. Prescription for Vicodin given  Return to see me in 2-3 weeks. We'll check TSH and calcium level at that time.  Eventually she will be referred to Dr. Tinnie Gens Kerr(previously  referred) for endocrine evaluation and radioiodine ablation.  Patient Active Hospital Problem List: No active hospital problems.   LOS: 1 day    Labrina Lines M. Derrell Lolling, M.D., [facility] General and Minimally invasive Surgery Breast and Colorectal Surgery Office:   [phone] Pager:   [phone]  05/24/2012  . .prob

## 2012-05-24 NOTE — Discharge Summary (Signed)
Patient ID: [name] [id number] 78 y.o. [birth year]  05/23/2012  Discharge date and time: May 24, 2012  Admitting Physician: Tracy Mclaughlin  Discharge Physician: Tracy Mclaughlin  Admission Diagnoses: thyroid cancer   Discharge Diagnoses: papillary thyroid cancer, suspect multifocal, final pathology pending  Operations: Procedure(s): THYROIDECTOMY, Limited neck dissection, reimplantation left superior parathyroid  Admission Condition: good  Discharged Condition: good  Indication for Admission:  Tracy Mclaughlin is a 78 y.o. female. She was referred by Tracy Mclaughlin for management of a newly diagnosed papillary carcinoma of the thyroid.  This very pleasant woman and her husband live in [place], [place]. She is a retired Engineer, civil (consulting). 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 She has been treated with Rituxan and will continue to receive that in the future.  She also underwent right colectomy on October 19, 2011 in [place], [place] for a T3 N0 cancer of the cecum. 0/16 nodes were harvested. She did very well from that surgery without any problems. She is being followed with surveillance only for that. She has had one episode of atrial fibrillation in the past.  She was diagnosed with deep venous thrombosis and pulmonary embolism in December 2012.  In terms of her thyroid gland she is basically asymptomatic. She does not feel a mass. There are no pressure symptoms. She's had intermittent hoarseness this spring which he thinks is due to pollen. Her voice is basically strong.  Surveillance PET/CT recently showed hypermetabolic uptake in the left thyroid lobe. Ultrasound showed multiple nodules of both lobes largest being 1.4 cm. Ultrasound-guided biopsy of a left thyroid nodule confirmed papillary thyroid cancer. There is no family history of  multiple endocrine neoplasia. Patient denies any history of radiation therapy to the neck. She wants to have a thyroidectomy at the earliest convenience.she has been counseled as an outpatient. She is brought to surgery electively.   Hospital Course: on the day of admission the patient was taken to the operating room. She underwent total thyroidectomy. She had a palpable nodule on the left side and smaller nodules elsewhere. The left superior parathyroid gland was devascularized and was sectioned and reimplanted into the left sternocleidomastoid muscle. A right paratracheal mass and a  left paratracheal mass were thought to be enlarged  lymph nodes.   These were excised and sent as separate specimens. Surgery was otherwise uneventful. The patient remained stable overnight. Her voice remained strong. She had no swallowing problems. No bleeding. Calcium  on postop day #1 was 8.3. She was asymptomatic. She will be discharged on postop day 1. She will be given a prescription for synthyroid 50 mcg per day to be taken in the morning. She'll be advised to take calcium carbonate 1500 mg at lunch and 1500 mg at supper. She'll be given a prescription for Vicodin. She'll be asked to return to see me in the office in 2 weeks and will check a calcium level and TSH at that time. She will eventually be referred to endocrinology, Tracy Mclaughlin for endocrine evaluation and supervision of radioiodine ablation.  Consults: None  Significant Diagnostic Studies: pathology (pending)  Treatments: surgery: total thyroidectomy, limited neck dissection, reimplantation left superior parathyroid gland.  Disposition: Home  Patient Instructions:   Tracy Mclaughlin  Home Medication Instructions QMV:[id number]   Printed on:05/24/12 0804  Medication Information  carvedilol (COREG) 6.25 MG tablet Take 6.25 mg by mouth 2 (two) times daily with a meal.             diphenhydrAMINE (BENADRYL) 25 MG tablet Take 25  mg by mouth at bedtime as needed. For itching/sleep           losartan (COZAAR) 100 MG tablet Take 100 mg by mouth daily.             Rivaroxaban (XARELTO) 20 MG TABS Take 1 tablet by mouth daily.           citalopram (CELEXA) 10 MG tablet Take 10 mg by mouth daily.           loratadine (CLARITIN) 10 MG tablet Take 10 mg by mouth as needed.            diltiazem (TIAZAC) 120 MG 24 hr capsule Take 120 mg by mouth daily.           ciprofloxacin (CIPRO) 500 MG tablet Take 500 mg by mouth 2 (two) times daily.             Activity: activity as tolerated. She was instructed that she should take a 30 minute walk daily. She may drive her car in one week. She may resume strenuous activities in 2 weeks. She may shower. Diet: low fat, low cholesterol diet Wound Care: none needed  Follow-up:  With Dr. Derrell Mclaughlin in 2 weeks.  Signed: Angelia Mould. Derrell Mclaughlin, M.D., FACS General and minimally invasive surgery Breast and Colorectal Surgery  05/24/2012, 8:04 AM

## 2012-05-25 ENCOUNTER — Other Ambulatory Visit (INDEPENDENT_AMBULATORY_CARE_PROVIDER_SITE_OTHER): Payer: Self-pay

## 2012-05-25 DIAGNOSIS — C73 Malignant neoplasm of thyroid gland: Secondary | ICD-10-CM

## 2012-06-09 ENCOUNTER — Other Ambulatory Visit: Payer: Self-pay | Admitting: Hematology and Oncology

## 2012-06-09 ENCOUNTER — Other Ambulatory Visit (HOSPITAL_BASED_OUTPATIENT_CLINIC_OR_DEPARTMENT_OTHER): Payer: Medicare Other | Admitting: Lab

## 2012-06-09 ENCOUNTER — Ambulatory Visit (HOSPITAL_COMMUNITY)
Admission: RE | Admit: 2012-06-09 | Discharge: 2012-06-09 | Disposition: A | Discharge status: Home / Self Care | Payer: Medicare Other | Source: Ambulatory Visit | Attending: Hematology and Oncology | Admitting: Hematology and Oncology

## 2012-06-09 DIAGNOSIS — R918 Other nonspecific abnormal finding of lung field: Secondary | ICD-10-CM | POA: Insufficient documentation

## 2012-06-09 DIAGNOSIS — C8589 Other specified types of non-Hodgkin lymphoma, extranodal and solid organ sites: Secondary | ICD-10-CM | POA: Insufficient documentation

## 2012-06-09 DIAGNOSIS — C189 Malignant neoplasm of colon, unspecified: Secondary | ICD-10-CM | POA: Insufficient documentation

## 2012-06-09 DIAGNOSIS — R05 Cough: Secondary | ICD-10-CM | POA: Insufficient documentation

## 2012-06-09 DIAGNOSIS — R059 Cough, unspecified: Secondary | ICD-10-CM | POA: Insufficient documentation

## 2012-06-09 DIAGNOSIS — C8590 Non-Hodgkin lymphoma, unspecified, unspecified site: Secondary | ICD-10-CM

## 2012-06-09 DIAGNOSIS — Z9089 Acquired absence of other organs: Secondary | ICD-10-CM | POA: Insufficient documentation

## 2012-06-09 LAB — CBC WITH DIFFERENTIAL/PLATELET
BASO%: 2.2 % — ABNORMAL HIGH (ref 0.0–2.0)
Basophils Absolute: 0.1 10*3/uL (ref 0.0–0.1)
EOS%: 7.6 % — ABNORMAL HIGH (ref 0.0–7.0)
HCT: 44.7 % (ref 34.8–46.6)
HGB: 15.2 g/dL (ref 11.6–15.9)
LYMPH%: 47.3 % (ref 14.0–49.7)
MCH: 32.5 pg (ref 25.1–34.0)
MCHC: 33.9 g/dL (ref 31.5–36.0)
NEUT%: 33.2 % — ABNORMAL LOW (ref 38.4–76.8)
Platelets: 151 10*3/uL (ref 145–400)

## 2012-06-09 LAB — CMP (CANCER CENTER ONLY)
Albumin: 4.1 g/dL (ref 3.3–5.5)
Alkaline Phosphatase: 89 U/L — ABNORMAL HIGH (ref 26–84)
BUN, Bld: 18 mg/dL (ref 7–22)
CO2: 32 mEq/L (ref 18–33)
Glucose, Bld: 113 mg/dL (ref 73–118)
Potassium: 5.1 mEq/L — ABNORMAL HIGH (ref 3.3–4.7)
Sodium: 138 mEq/L (ref 128–145)
Total Protein: 7.1 g/dL (ref 6.4–8.1)

## 2012-06-09 MED ORDER — IOHEXOL 300 MG/ML  SOLN
80.0000 mL | Freq: Once | INTRAMUSCULAR | Status: AC | PRN
  Administered 2012-06-09: 80 mL via INTRAVENOUS

## 2012-06-14 ENCOUNTER — Encounter: Payer: Self-pay | Admitting: Hematology and Oncology

## 2012-06-14 ENCOUNTER — Ambulatory Visit: Payer: PRIVATE HEALTH INSURANCE | Admitting: Hematology and Oncology

## 2012-06-14 ENCOUNTER — Ambulatory Visit (HOSPITAL_BASED_OUTPATIENT_CLINIC_OR_DEPARTMENT_OTHER): Payer: Medicare Other | Admitting: Hematology and Oncology

## 2012-06-14 ENCOUNTER — Telehealth: Payer: Self-pay | Admitting: Hematology and Oncology

## 2012-06-14 ENCOUNTER — Ambulatory Visit (HOSPITAL_BASED_OUTPATIENT_CLINIC_OR_DEPARTMENT_OTHER): Payer: Medicare Other

## 2012-06-14 VITALS — BP 143/56 | HR 55 | Temp 99.0°F | Ht 68.0 in | Wt 204.3 lb

## 2012-06-14 DIAGNOSIS — Z5112 Encounter for antineoplastic immunotherapy: Secondary | ICD-10-CM

## 2012-06-14 DIAGNOSIS — C182 Malignant neoplasm of ascending colon: Secondary | ICD-10-CM

## 2012-06-14 DIAGNOSIS — C8307 Small cell B-cell lymphoma, spleen: Secondary | ICD-10-CM

## 2012-06-14 DIAGNOSIS — C73 Malignant neoplasm of thyroid gland: Secondary | ICD-10-CM

## 2012-06-14 DIAGNOSIS — C770 Secondary and unspecified malignant neoplasm of lymph nodes of head, face and neck: Secondary | ICD-10-CM

## 2012-06-14 DIAGNOSIS — C8590 Non-Hodgkin lymphoma, unspecified, unspecified site: Secondary | ICD-10-CM

## 2012-06-14 DIAGNOSIS — C8588 Other specified types of non-Hodgkin lymphoma, lymph nodes of multiple sites: Secondary | ICD-10-CM

## 2012-06-14 MED ORDER — SODIUM CHLORIDE 0.9 % IV SOLN: Freq: Once | INTRAVENOUS | Status: DC

## 2012-06-14 MED ORDER — SODIUM CHLORIDE 0.9 % IV SOLN
375.0000 mg/m2 | Freq: Once | INTRAVENOUS | Status: AC
  Administered 2012-06-14: 800 mg via INTRAVENOUS
  Filled 2012-06-14: qty 80

## 2012-06-14 MED ORDER — ACETAMINOPHEN 325 MG PO TABS
650.0000 mg | ORAL_TABLET | Freq: Once | ORAL | Status: AC
  Administered 2012-06-14: 650 mg via ORAL

## 2012-06-14 MED ORDER — DIPHENHYDRAMINE HCL 25 MG PO CAPS
50.0000 mg | ORAL_CAPSULE | Freq: Once | ORAL | Status: AC
  Administered 2012-06-14: 50 mg via ORAL

## 2012-06-14 NOTE — Telephone Encounter (Signed)
Gave pt appt for August 2013 chemo, lab and CT in October then see MD after a few days the chemo again in December 2013 per MD

## 2012-06-14 NOTE — Patient Instructions (Signed)
Tracy Mclaughlin  [id number]  [facility] Discharge Instructions  RECOMMENDATIONS MADE BY THE CONSULTANT AND ANY TEST RESULTS WILL BE SENT TO YOUR REFERRING DOCTOR.   EXAM FINDINGS BY MD TODAY AND SIGNS AND SYMPTOMS TO REPORT TO CLINIC OR PRIMARY MD:   Your current list of medications are: Current Outpatient Prescriptions  Medication Sig Dispense Refill  . citalopram (CELEXA) 10 MG tablet Take 40 mg by mouth daily.       Marland Kitchen diltiazem (TIAZAC) 120 MG 24 hr capsule Take 120 mg by mouth daily.      . diphenhydrAMINE (BENADRYL) 25 MG tablet Take 25 mg by mouth at bedtime as needed. For itching/sleep      . levothyroxine (SYNTHROID, LEVOTHROID) 50 MCG tablet Take 75 mcg by mouth daily.      Marland Kitchen losartan (COZAAR) 100 MG tablet Take 100 mg by mouth daily.        . Rivaroxaban (XARELTO) 20 MG TABS Take 1 tablet by mouth daily.      Marland Kitchen DISCONTD: levothyroxine (SYNTHROID) 50 MCG tablet Take 1 tablet (50 mcg total) by mouth daily.  30 tablet  3     INSTRUCTIONS GIVEN AND DISCUSSED:   SPECIAL INSTRUCTIONS/FOLLOW-UP:  See above.  I acknowledge that I have been informed and understand all the instructions given to me and received a copy. I do not have any more questions at this time, but understand that I may call the [facility] at [phone] during business hours should I have any further questions or need assistance in obtaining follow-up care.

## 2012-06-14 NOTE — Progress Notes (Signed)
CC:   Angelia Mould. Derrell Lolling, M.D. Clarene Duke, MD, Fax [phone] [name], MD, Fax [phone] Tonita Cong, M.D. [name], M.D.  IDENTIFYING STATEMENT:  The patient is a 78 year old woman with lymphoma and colon cancer who presents for followup.  INTERVAL HISTORY:  Mrs. Wechter completed 4 weekly treatments of Rituxan on March 22, 2012.  She is currently on maintenance Rituxan therapy scheduled every 2 months.  She was diagnosed with papillary thyroid cancer.  She was referred and seen by Dr. Claud Kelp.  On 05/24/2012 he performed a thyroidectomy with reimplantation of the left superior parathyroid on 05/23/2012.  Surgical pathology revealed a 1.3 cm tumor with other areas of multiple papillary carcinomas noted.  There was no evidence of tumor within the capsule or invasion.  Margins were negative.  There was evidence of lymphovascular invasion.  There were 2 positive lymph nodes; 1 in the left pretracheal node and the other involving the right paratracheal node.  Tumor was pathologically staged as a T1b N1b M0 stage IVA.  Patient generally feels well.  Has good energy levels.  She is currently on thyroid replacement therapy with levothyroxine managed by Dr. Holland Falling.  Denies fever, chills, or night sweats.  Denies pain.  MEDICATIONS:  Reviewed and updated.  ALLERGIES:  None.  PAST MEDICAL HISTORY/FAMILY HISTORY/SOCIAL HISTORY:  Unchanged.  PHYSICAL EXAM:  Patient is alert and oriented x3.  Vitals:  Pulse 55, blood pressure 143/56, temperature 99, respirations 20, weight 204 pounds.  HEENT:  Head is atraumatic, normocephalic.  Sclerae anicteric. Mouth moist.  Neck:  Supple.  Well-healed thyroidectomy incision. Chest:  Clear to percussion and auscultation.  CVS:  First and second heart sounds present.  No added sounds or murmurs.  Abdomen:  Soft, nontender.  Bowel sounds present.  Extremities:  No edema.  Lymph nodes: No palpable cervical, axillary, inguinal  adenopathy.  CNS:  Nonfocal.  LAB DATA:  06/09/2012 white cell count 4.8, hemoglobin 15.2, hematocrit 44.7, platelets 151.  Sodium 138, potassium 5.1, chloride 97, CO2 32, BUN 18, creatinine 1.1, glucose 113, T-bili 1.2, alkaline phosphatase 89, AST 29, ALT 25, calcium 9, LDH 129.  CEA 2.9.  CT scan of the chest, abdomen and pelvis on 06/09/2012 showed unremarkable chest wall with no breast masses, supraclavicular or axillary adenopathy.  Surgical changes from patient's recent thyroidectomy was noted.  Heart size was normal with no pericardial effusion.  There was no mediastinal or hilar lymphadenopathy.  There were no pulmonary masses.  The spleen was normal in size with few scattered splenic lesions noted.  There was no abdominal pelvic adenopathy.  IMPRESSION AND PLAN:  Mrs. Dedic is a 78 year old woman with: 1. 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  Status post Rituxan weekly x4 from     03/01/2012 through 03/22/2012.  Patient is currently on maintenance     Rituxan scheduled every 2 months beginning 04/09/2012.  CT scans     indicate no evidence of recurrence.  She gets treated today. 2. Stage IVA (T1b N1b M0) papillary thyroid carcinoma status post     thyroidectomy with reimplantation of left superior parathyroid     gland on 05/23/2012.  She is on thyroid replacement therapy.  She     has an appointment to see Dr. Talmage Coin to be considered for     radioiodine therapy for possible microscopic metastatic disease. 3. 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  I have informed the patient that she requires surveillance     colonoscopy in October of this year. 4. History of pulmonary embolism status post heparin bridge to     Coumadin, currently on Xarelto. 5.  Atrial fibrillation on Xarelto followed by Verdis Prime. The patient continues with maintenance Rituxan and follows up in 4 months' time with a CT of the neck, chest, abdomen and pelvis and lab work.    ______________________________ Laurice Record, M.D. LIO/MEDQ  D:  06/14/2012  T:  06/14/2012  Job:  [id number]

## 2012-06-14 NOTE — Progress Notes (Signed)
This office note has been dictated.

## 2012-06-14 NOTE — Patient Instructions (Addendum)
Pt will call with any questions 

## 2012-06-15 ENCOUNTER — Encounter (INDEPENDENT_AMBULATORY_CARE_PROVIDER_SITE_OTHER): Payer: Self-pay | Admitting: General Surgery

## 2012-06-15 ENCOUNTER — Ambulatory Visit (INDEPENDENT_AMBULATORY_CARE_PROVIDER_SITE_OTHER): Payer: Medicare Other | Admitting: General Surgery

## 2012-06-15 ENCOUNTER — Ambulatory Visit: Payer: PRIVATE HEALTH INSURANCE

## 2012-06-15 VITALS — BP 132/70 | HR 68 | Temp 97.8°F | Resp 14 | Ht 68.0 in | Wt 204.0 lb

## 2012-06-15 DIAGNOSIS — C73 Malignant neoplasm of thyroid gland: Secondary | ICD-10-CM

## 2012-06-15 NOTE — Patient Instructions (Signed)
You are doing well from your thyroid surgery. The incisions are healing without any sign of complication. Your voice is normal and your calcium level is normal.  I advised you to take one calcium tablet daily  Keep her appointment with Dr. Sharl Ma on July 23. He will supervise the rest of your treatment for your thyroid cancer.  Return to see Dr. Derrell Lolling in 3 months.

## 2012-06-15 NOTE — Progress Notes (Signed)
Subjective:     Patient ID: Tracy Mclaughlin, female   DOB: October 06, 1934, 78 y.o.   MRN: [id number]  HPI This patient underwent total thyroidectomy with limited neck dissection and reimplantation of the left superior parathyroid gland on May 24, 2011.  The thyroid specimen showed multifocal papillary thyroid carcinoma with positive lymphovascular invasion. The 2 lymph nodes that were removed were both positive for metastatic papillary thyroid cancer. Pathologic stage TI B., N1b..  She has done very well since that time. She is swallowing normally. Her voice is normal. She has no pain.  Recent calcium level is 9.0. She has no paresthesias or muscle cramps.  She states that her primary care physician in [place], Dr. Holland Falling check her TSH and told her that it was high and he increased her Synthroid from 50-75 mcg per day.  She has an appointment to see Dr. Talmage Coin on July 23 for evaluation of her thyroid cancer and to plan and supervise her radioiodine ablation.  Review of Systems     Objective:   Physical Exam The patient is here with her husband. She is in good spirits. She is in no distress.  Voice is strong. She phonates normally. Chvostek's is negative bilaterally. Neck incision is healing normally. Minimal edema. No infection. Good range of motion.    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  . Recovering uneventfully following total thyroidectomy with limited neck dissection.  Normal parathyroid function  Splenic marginal zone lymphoma  History colon cancer, status post right colectomy and marginal [place] for T3 N0 cancer. Her history pulmonary embolism.    Plan:     She is advised to take one calcium tablet per day  She is is advised to continue monitoring of her thyroid function with both her primary care physician in [facility] and with Dr. Talmage Coin.  She will see Dr. Sharl Ma and on July 23 to begin  planning for her radioiodine ablation.  Return to see me in 3 months. Consider consider checking intact parathyroid hormone level at that time.   Angelia Mould. Derrell Lolling, M.D., [facility] General and Minimally invasive Surgery Breast and Colorectal Surgery Office:   [phone] Pager:   [phone]

## 2012-07-11 ENCOUNTER — Other Ambulatory Visit: Payer: Self-pay | Admitting: Internal Medicine

## 2012-07-11 ENCOUNTER — Telehealth: Payer: Self-pay | Admitting: *Deleted

## 2012-07-11 DIAGNOSIS — C73 Malignant neoplasm of thyroid gland: Secondary | ICD-10-CM

## 2012-07-11 NOTE — Telephone Encounter (Signed)
Received call from Dr. Talmage Coin needing to talk to Dr. Dalene Carrow about pt.   Informed Dr. Sharl Ma that md is out of office until  07/18/12.   Dr. Sharl Ma stated it would be fine to wait to talk to Dr. Dalene Carrow next week. Dr.   Talmage Coin     [phone].

## 2012-07-26 ENCOUNTER — Encounter (HOSPITAL_COMMUNITY)
Admission: RE | Admit: 2012-07-26 | Discharge: 2012-07-26 | Disposition: A | Discharge status: Home / Self Care | Payer: Medicare Other | Source: Ambulatory Visit | Attending: Internal Medicine | Admitting: Internal Medicine

## 2012-07-26 DIAGNOSIS — C73 Malignant neoplasm of thyroid gland: Secondary | ICD-10-CM | POA: Insufficient documentation

## 2012-07-26 MED ORDER — THYROTROPIN ALFA 1.1 MG IM SOLR
0.9000 mg | INTRAMUSCULAR | Status: DC
  Filled 2012-07-26: qty 0.9

## 2012-07-27 ENCOUNTER — Ambulatory Visit (HOSPITAL_COMMUNITY)
Admission: RE | Admit: 2012-07-27 | Discharge: 2012-07-27 | Disposition: A | Discharge status: Home / Self Care | Payer: Medicare Other | Source: Ambulatory Visit | Attending: Internal Medicine | Admitting: Internal Medicine

## 2012-07-27 MED ORDER — THYROTROPIN ALFA 1.1 MG IM SOLR
0.9000 mg | INTRAMUSCULAR | Status: DC
  Filled 2012-07-27: qty 0.9

## 2012-07-28 ENCOUNTER — Encounter (HOSPITAL_COMMUNITY)
Admission: RE | Admit: 2012-07-28 | Discharge: 2012-07-28 | Disposition: A | Discharge status: Home / Self Care | Payer: Medicare Other | Source: Ambulatory Visit | Attending: Internal Medicine | Admitting: Internal Medicine

## 2012-07-28 MED ORDER — SODIUM IODIDE I 131 CAPSULE
155.0000 | Freq: Once | INTRAVENOUS | Status: AC | PRN
  Administered 2012-07-28: 155 via ORAL

## 2012-08-07 ENCOUNTER — Encounter (HOSPITAL_COMMUNITY)
Admission: RE | Admit: 2012-08-07 | Discharge: 2012-08-07 | Disposition: A | Discharge status: Home / Self Care | Payer: Medicare Other | Source: Ambulatory Visit | Attending: Internal Medicine | Admitting: Internal Medicine

## 2012-08-07 DIAGNOSIS — C73 Malignant neoplasm of thyroid gland: Secondary | ICD-10-CM | POA: Insufficient documentation

## 2012-08-09 MED FILL — Thyrotropin Alfa For Inj 1.1 MG: INTRAMUSCULAR | Qty: 1.8 | Status: AC

## 2012-08-16 ENCOUNTER — Ambulatory Visit (HOSPITAL_BASED_OUTPATIENT_CLINIC_OR_DEPARTMENT_OTHER): Payer: Medicare Other

## 2012-08-16 VITALS — BP 130/63 | HR 43 | Temp 97.3°F | Resp 20

## 2012-08-16 DIAGNOSIS — C8589 Other specified types of non-Hodgkin lymphoma, extranodal and solid organ sites: Secondary | ICD-10-CM

## 2012-08-16 DIAGNOSIS — C8590 Non-Hodgkin lymphoma, unspecified, unspecified site: Secondary | ICD-10-CM

## 2012-08-16 DIAGNOSIS — Z5112 Encounter for antineoplastic immunotherapy: Secondary | ICD-10-CM

## 2012-08-16 MED ORDER — SODIUM CHLORIDE 0.9 % IV SOLN
375.0000 mg/m2 | Freq: Once | INTRAVENOUS | Status: AC
  Administered 2012-08-16: 800 mg via INTRAVENOUS
  Filled 2012-08-16: qty 80

## 2012-08-16 MED ORDER — DIPHENHYDRAMINE HCL 25 MG PO CAPS
50.0000 mg | ORAL_CAPSULE | Freq: Once | ORAL | Status: AC
  Administered 2012-08-16: 50 mg via ORAL

## 2012-08-16 MED ORDER — ACETAMINOPHEN 325 MG PO TABS
650.0000 mg | ORAL_TABLET | Freq: Once | ORAL | Status: AC
  Administered 2012-08-16: 650 mg via ORAL

## 2012-08-16 MED ORDER — SODIUM CHLORIDE 0.9 % IV SOLN
Freq: Once | INTRAVENOUS | Status: AC
  Administered 2012-08-16: 12:00:00 via INTRAVENOUS

## 2012-08-16 NOTE — Patient Instructions (Addendum)
[facility] Discharge Instructions for Patients Receiving Chemotherapy  Today you received the following chemotherapy agents Rituxin  To help prevent nausea and vomiting after your treatment, we encourage you to take your nausea medication Begin taking it at 7 pm and take it as often as prescribed for the next 24 to 72 hours.   If you develop nausea and vomiting that is not controlled by your nausea medication, call the clinic. If it is after clinic hours your family physician or the after hours number for the clinic or go to the Emergency Department.   BELOW ARE SYMPTOMS THAT SHOULD BE REPORTED IMMEDIATELY:  *FEVER GREATER THAN 100.5 F  *CHILLS WITH OR WITHOUT FEVER  NAUSEA AND VOMITING THAT IS NOT CONTROLLED WITH YOUR NAUSEA MEDICATION  *UNUSUAL SHORTNESS OF BREATH  *UNUSUAL BRUISING OR BLEEDING  TENDERNESS IN MOUTH AND THROAT WITH OR WITHOUT PRESENCE OF ULCERS  *URINARY PROBLEMS  *BOWEL PROBLEMS  UNUSUAL RASH Items with * indicate a potential emergency and should be followed up as soon as possible.  One of the nurses will contact you 24 hours after your treatment. Please let the nurse know about any problems that you may have experienced. Feel free to call the clinic you have any questions or concerns. The clinic phone number is [phone].   I have been informed and understand all the instructions given to me. I know to contact the clinic, my physician, or go to the Emergency Department if any problems should occur. I do not have any questions at this time, but understand that I may call the clinic during office hours   should I have any questions or need assistance in obtaining follow up care.    __________________________________________  _____________  __________ Signature of Patient or Authorized Representative            Date                   Time    __________________________________________ Nurse's Signature

## 2012-08-16 NOTE — Progress Notes (Signed)
The Pharmacist Herbert Seta) requested that I ask Dr. Dalene Carrow for a CBC to be performed on Tracy Mclaughlin. Jan asked Dr. Dalene Carrow. Dr. Dalene Carrow declined the Pharmacist request.

## 2012-09-07 ENCOUNTER — Encounter (INDEPENDENT_AMBULATORY_CARE_PROVIDER_SITE_OTHER): Payer: Self-pay | Admitting: General Surgery

## 2012-09-08 ENCOUNTER — Encounter (INDEPENDENT_AMBULATORY_CARE_PROVIDER_SITE_OTHER): Payer: Self-pay | Admitting: General Surgery

## 2012-09-08 ENCOUNTER — Ambulatory Visit (INDEPENDENT_AMBULATORY_CARE_PROVIDER_SITE_OTHER): Payer: Medicare Other | Admitting: General Surgery

## 2012-09-08 VITALS — BP 124/62 | HR 64 | Temp 97.7°F | Resp 16 | Ht 67.5 in | Wt 204.6 lb

## 2012-09-08 DIAGNOSIS — C73 Malignant neoplasm of thyroid gland: Secondary | ICD-10-CM

## 2012-09-08 NOTE — Progress Notes (Signed)
Patient ID: Tracy Mclaughlin, female   DOB: December 19, 1934, 78 y.o.   MRN: [id number]  Chief Complaint  Patient presents with  . Routine Post Op    reck thyroid    HPI Tracy Mclaughlin is a 78 y.o. female.  She returns for long-term followup or regarding her papillary thyroid cancer.  This patient underwent total thyroidectomy with limited neck dissection and reimplantation of the left superior parathyroid gland on May 24, 2011.  The thyroid specimen showed multifocal papillary thyroid carcinoma with positive lymphovascular invasion. The 2 lymph nodes that were removed were both positive for metastatic papillary thyroid cancer. Pathologic stage TI B., N1b..  She has done very well since that time. She is swallowing normally. Her voice is normal. She has no pain.  Recent calcium level is 9.6. She has no paresthesias or muscle cramps.   She has undergone radioiodine ablation of her thyroid bed under the supervision of Dr. Talmage Coin. Her preop thyroid scan showed some residual uptake in the neck but no evidence of any metastatic disease. Dr. Sharl Ma is now regulating her Synthroid. Recent TSH was 18.75 and her dosages been increased. He is scheduling her for followup blood work and scans in the future.  She continues to follow with Dr. Margaretha Glassing Odogwu regarding her B cell lymphoma and her right colon cancer. Mostly surveillance only.  She is pleased with the result of her thyroid surgery. Her thyroid incision causes no problems and is not very visible to her. She is here with her husband today.  HPI  Past Medical History  Diagnosis Date  . Hypertension   . Arthritis   . Subarachnoid hemorrhage 1996  . Diverticulitis   . Non Hodgkin's lymphoma     Non-Hodgkin's B-Cell Lymphoma  . Cancer     Thyroid/Colon/ B cell Lymphoma-remission  . History of atrial fibrillation     Was in only in a- fib for 2 days  . Pulmonary embolus 11/2011  . Thyroid disease   . Paroxysmal atrial fibrillation   . Papillary  thyroid carcinoma   . Post-surgical hypothyroidism     Past Surgical History  Procedure Date  . Knee surgery 1998    S/P  Arthroscopic Left knee surgery  . Tuboplasty / tubotubal anastomosis   . Subdural hematoma evacuation via craniotomy   . Rotator cuff repair 1998  . Colon resection 11/12    mucinous adenocarcinoma of the cecum, resected  . Thyroidectomy 05/23/2012    Procedure: THYROIDECTOMY;  Surgeon: [name], MD;  Location: [facility] OR;  Service: General;  Laterality: N/A;  Total thyroidectomy, central compartment lymph node biopsy times two, reimplantation left superior parathyroid gland.  . Thyroidectomy June 2013  . Appendectomy 1959  . Cholecystectomy 1999    S/P Lap Cholecystectomy    Family History  Problem Relation Age of Onset  . Kidney cancer Sister   . Hypertension Sister   . Lung cancer Brother   . Cancer Brother     Lung  . Anesthesia problems Neg Hx   . Hypertension Mother   . Diabetes Mother   . Coronary artery disease Mother     Social History History  Substance Use Topics  . Smoking status: Former Smoker -- 1.0 packs/day for 12 years    Types: Cigarettes    Quit date: 08/23/1969  . Smokeless tobacco: Never Used  . Alcohol Use: Yes     1 manhattens per night    No Known Allergies  Current Outpatient Prescriptions  Medication  Sig Dispense Refill  . carvedilol (COREG) 6.25 MG tablet Take 6.25 mg by mouth 2 (two) times daily with a meal.      . citalopram (CELEXA) 10 MG tablet Take 40 mg by mouth daily.       Marland Kitchen diltiazem (DILACOR XR) 120 MG 24 hr capsule Daily.      Marland Kitchen diltiazem (TIAZAC) 120 MG 24 hr capsule Take 120 mg by mouth daily.      . diphenhydrAMINE (BENADRYL) 25 MG tablet Take 25 mg by mouth at bedtime as needed. For itching/sleep      . levothyroxine (SYNTHROID, LEVOTHROID) 75 MCG tablet Take 75 mcg by mouth daily. Takes 125mg  per day      . loratadine (CLARITIN) 10 MG tablet Take 10 mg by mouth daily.      Marland Kitchen losartan (COZAAR) 100  MG tablet Take 100 mg by mouth daily.        . riTUXimab (RITUXAN) 10 MG/ML injection Inject into the vein every 8 (eight) weeks.      . Rivaroxaban (XARELTO) 20 MG TABS Take 1 tablet by mouth daily.        Review of Systems Review of Systems  Constitutional: Negative for fever, chills and unexpected weight change.  HENT: Negative for hearing loss, congestion, sore throat, trouble swallowing and voice change.   Eyes: Negative for visual disturbance.  Respiratory: Negative for cough and wheezing.   Cardiovascular: Negative for chest pain, palpitations and leg swelling.  Gastrointestinal: Negative for nausea, vomiting, abdominal pain, diarrhea, constipation, blood in stool, abdominal distention and anal bleeding.  Genitourinary: Negative for hematuria, vaginal bleeding and difficulty urinating.  Musculoskeletal: Negative for arthralgias.  Skin: Negative for rash and wound.  Neurological: Negative for seizures, syncope and headaches.  Hematological: Negative for adenopathy. Does not bruise/bleed easily.  Psychiatric/Behavioral: Negative for confusion.    Blood pressure 124/62, pulse 64, temperature 97.7 F (36.5 C), temperature source Temporal, resp. rate 16, height 5' 7.5" (1.715 m), weight 204 lb 9.6 oz (92.806 kg).  Physical Exam Physical Exam  Constitutional: She is oriented to person, place, and time. She appears well-developed and well-nourished. No distress.  HENT:  Head: Normocephalic and atraumatic.  Nose: Nose normal.  Eyes: Conjunctivae normal and EOM are normal. Pupils are equal, round, and reactive to light. Left eye exhibits no discharge. No scleral icterus.  Neck: Neck supple. No JVD present. No tracheal deviation present. No thyromegaly present.       Thyroidectomy scar well healed. Cosmesis excellent.  Cardiovascular: Normal rate, regular rhythm, normal heart sounds and intact distal pulses.   No murmur heard. Pulmonary/Chest: Effort normal and breath sounds normal.  No respiratory distress. She has no wheezes. She has no rales. She exhibits no tenderness.  Lymphadenopathy:    She has no cervical adenopathy.  Neurological: She is alert and oriented to person, place, and time. She exhibits normal muscle tone. Coordination normal.  Skin: Skin is warm. No rash noted. She is not diaphoretic. No erythema. No pallor.  Psychiatric: She has a normal mood and affect. Her behavior is normal. Judgment and thought content normal.    Data Reviewed My old chart records. Dr. Daune Perch office notes. Dr. Lonell Face office notes.  Assessment    Papillary thyroid cancer, pathologic stage TI B., N1 B.  Status post total thyroidectomy and limited central compartment neck dissection. No evidence of local recurrence by physical exam  Status post radioiodine ablation  Surgical hypothyroidism, being regulated by Dr. Westley Chandler.  History right colectomy  for T3 N0 colon cancer, [facility],  [place], October 2012  B-cell lymphoma, followed by Dr. Dalene Carrow    Plan    Continue followup with Dr. Dalene Carrow, Dr. Sharl Ma, and her primary care physician Dr. Holland Falling in [place].  She was encouraged to get a colonoscopy in October or November of this year.  She was encouraged to get followup lab work and scans are recommended by Dr. Sharl Ma and Dr. Dalene Carrow.  Her to see me in one year for physical exam.       Angelia Mould. Derrell Lolling, M.D., [facility] General and Minimally invasive Surgery Breast and Colorectal Surgery Office:   [phone] Pager:   [phone]  09/08/2012, 11:15 AM

## 2012-09-08 NOTE — Patient Instructions (Signed)
Your physical exam today is normal. There is no evidence of recurrent thyroid cancer.  Dr. Sharl Ma and Dr. Dalene Carrow will schedule you for all of the blood work and followup scans that you need.  Be sure to get a colonoscopy sometime this fall  Return to see Dr. Derrell Lolling in one year.

## 2012-10-13 ENCOUNTER — Telehealth: Payer: Self-pay

## 2012-10-13 NOTE — Telephone Encounter (Signed)
S/w office personnell requesting results of colonoscopy done last Wed. They stated they have not received report from hospital yet but will fax when ready. Our fax number given.

## 2012-10-16 ENCOUNTER — Other Ambulatory Visit (HOSPITAL_BASED_OUTPATIENT_CLINIC_OR_DEPARTMENT_OTHER): Payer: Medicare Other | Admitting: Lab

## 2012-10-16 ENCOUNTER — Ambulatory Visit (HOSPITAL_COMMUNITY)
Admission: RE | Admit: 2012-10-16 | Discharge: 2012-10-16 | Disposition: A | Discharge status: Home / Self Care | Payer: Medicare Other | Source: Ambulatory Visit | Attending: Hematology and Oncology | Admitting: Hematology and Oncology

## 2012-10-16 ENCOUNTER — Other Ambulatory Visit: Payer: Self-pay | Admitting: Hematology and Oncology

## 2012-10-16 ENCOUNTER — Ambulatory Visit (HOSPITAL_COMMUNITY): Payer: Medicare Other

## 2012-10-16 DIAGNOSIS — C8589 Other specified types of non-Hodgkin lymphoma, extranodal and solid organ sites: Secondary | ICD-10-CM

## 2012-10-16 DIAGNOSIS — C8590 Non-Hodgkin lymphoma, unspecified, unspecified site: Secondary | ICD-10-CM

## 2012-10-16 LAB — CBC WITH DIFFERENTIAL/PLATELET
Eosinophils Absolute: 0.2 10*3/uL (ref 0.0–0.5)
LYMPH%: 26.5 % (ref 14.0–49.7)
MCHC: 34.9 g/dL (ref 31.5–36.0)
MCV: 102.6 fL — ABNORMAL HIGH (ref 79.5–101.0)
MONO%: 8.5 % (ref 0.0–14.0)
NEUT#: 4 10*3/uL (ref 1.5–6.5)
NEUT%: 61.1 % (ref 38.4–76.8)
Platelets: 162 10*3/uL (ref 145–400)
RBC: 3.82 10*6/uL (ref 3.70–5.45)

## 2012-10-16 LAB — LACTATE DEHYDROGENASE (CC13): LDH: 185 U/L (ref 125–220)

## 2012-10-16 LAB — COMPREHENSIVE METABOLIC PANEL (CC13)
Alkaline Phosphatase: 92 U/L (ref 40–150)
BUN: 21 mg/dL (ref 7.0–26.0)
CO2: 26 mEq/L (ref 22–29)
Glucose: 103 mg/dl — ABNORMAL HIGH (ref 70–99)
Total Bilirubin: 0.7 mg/dL (ref 0.20–1.20)

## 2012-10-16 MED ORDER — IOHEXOL 300 MG/ML  SOLN
100.0000 mL | Freq: Once | INTRAMUSCULAR | Status: AC | PRN
  Administered 2012-10-16: 100 mL via INTRAVENOUS

## 2012-10-18 ENCOUNTER — Encounter: Payer: Self-pay | Admitting: Hematology and Oncology

## 2012-10-18 ENCOUNTER — Telehealth: Payer: Self-pay | Admitting: Hematology and Oncology

## 2012-10-18 ENCOUNTER — Ambulatory Visit (HOSPITAL_BASED_OUTPATIENT_CLINIC_OR_DEPARTMENT_OTHER): Payer: Medicare Other

## 2012-10-18 ENCOUNTER — Ambulatory Visit (HOSPITAL_BASED_OUTPATIENT_CLINIC_OR_DEPARTMENT_OTHER): Payer: Medicare Other | Admitting: Hematology and Oncology

## 2012-10-18 VITALS — BP 100/38 | HR 56 | Temp 98.7°F

## 2012-10-18 VITALS — BP 143/63 | HR 54 | Temp 97.3°F | Resp 20 | Ht 67.5 in | Wt 211.9 lb

## 2012-10-18 DIAGNOSIS — C180 Malignant neoplasm of cecum: Secondary | ICD-10-CM

## 2012-10-18 DIAGNOSIS — C8587 Other specified types of non-Hodgkin lymphoma, spleen: Secondary | ICD-10-CM

## 2012-10-18 DIAGNOSIS — Z5112 Encounter for antineoplastic immunotherapy: Secondary | ICD-10-CM

## 2012-10-18 DIAGNOSIS — C73 Malignant neoplasm of thyroid gland: Secondary | ICD-10-CM

## 2012-10-18 DIAGNOSIS — C8307 Small cell B-cell lymphoma, spleen: Secondary | ICD-10-CM

## 2012-10-18 DIAGNOSIS — I2699 Other pulmonary embolism without acute cor pulmonale: Secondary | ICD-10-CM

## 2012-10-18 DIAGNOSIS — C8590 Non-Hodgkin lymphoma, unspecified, unspecified site: Secondary | ICD-10-CM

## 2012-10-18 MED ORDER — SODIUM CHLORIDE 0.9 % IV SOLN
Freq: Once | INTRAVENOUS | Status: AC
  Administered 2012-10-18: 13:00:00 via INTRAVENOUS

## 2012-10-18 MED ORDER — ACETAMINOPHEN 325 MG PO TABS
650.0000 mg | ORAL_TABLET | Freq: Once | ORAL | Status: AC
  Administered 2012-10-18: 650 mg via ORAL

## 2012-10-18 MED ORDER — DIPHENHYDRAMINE HCL 25 MG PO CAPS
50.0000 mg | ORAL_CAPSULE | Freq: Once | ORAL | Status: AC
  Administered 2012-10-18: 50 mg via ORAL

## 2012-10-18 MED ORDER — SODIUM CHLORIDE 0.9 % IV SOLN
375.0000 mg/m2 | Freq: Once | INTRAVENOUS | Status: AC
  Administered 2012-10-18: 800 mg via INTRAVENOUS
  Filled 2012-10-18: qty 80

## 2012-10-18 NOTE — Telephone Encounter (Signed)
Gave pt appt for lab and CT , pt given oral contrast, NPO 4 hours prior to CT, then see MD on 02/14/13

## 2012-10-18 NOTE — Patient Instructions (Addendum)
Tracy Mclaughlin  [id number]   [facility] - AFTER VISIT SUMMARY   **RECOMMENDATIONS MADE BY THE CONSULTANT AND ANY TEST    RESULTS WILL BE SENT TO YOUR REFERRING DOCTORS.   YOUR EXAM FINDINGS, LABS AND RESULTS WERE DISCUSSED BY YOUR MD TODAY.  YOU CAN GO TO THE [employer] WEB SITE FOR INSTRUCTIONS ON HOW TO ASSESS MY CHART FOR ADDITIONAL INFORMATION AS NEEDED.  Your Updated drug allergies are: Allergies as of 10/18/2012  . (No Known Allergies)    Your current list of medications are: Current Outpatient Prescriptions  Medication Sig Dispense Refill  . carvedilol (COREG) 6.25 MG tablet Take 6.25 mg by mouth 2 (two) times daily with a meal.      . citalopram (CELEXA) 10 MG tablet Take 40 mg by mouth daily.       Marland Kitchen diltiazem (DILACOR XR) 120 MG 24 hr capsule Daily.      Marland Kitchen diltiazem (TIAZAC) 120 MG 24 hr capsule Take 120 mg by mouth daily.      . diphenhydrAMINE (BENADRYL) 25 MG tablet Take 25 mg by mouth at bedtime as needed. For itching/sleep      . levothyroxine (SYNTHROID, LEVOTHROID) 75 MCG tablet Take 50 mcg by mouth daily. Takes 125mg  per day      . riTUXimab (RITUXAN) 10 MG/ML injection Inject into the vein every 8 (eight) weeks.      . Rivaroxaban (XARELTO) 20 MG TABS Take 1 tablet by mouth daily.         INSTRUCTIONS GIVEN AND DISCUSSED:  See attached schedule   SPECIAL INSTRUCTIONS/FOLLOW-UP:  See above.  I acknowledge that I have been informed and understand all the instructions given to me and received a copy.I know to contact the clinic, my physician, or go to the emergency Department if any problems should occur.   I do not have any more questions at this time, but understand that I may call the [facility] at [phone] during business hours should I have any further questions or need assistance in obtaining follow-up care.

## 2012-10-18 NOTE — Patient Instructions (Signed)
[facility] Discharge Instructions for Patients Receiving Chemotherapy  Today you received the following chemotherapy agents Rituxan  To help prevent nausea and vomiting after your treatment, we encourage you to take your nausea medication as prescribed.   If you develop nausea and vomiting that is not controlled by your nausea medication, call the clinic. If it is after clinic hours your family physician or the after hours number for the clinic or go to the Emergency Department.   BELOW ARE SYMPTOMS THAT SHOULD BE REPORTED IMMEDIATELY:  *FEVER GREATER THAN 100.5 F  *CHILLS WITH OR WITHOUT FEVER  NAUSEA AND VOMITING THAT IS NOT CONTROLLED WITH YOUR NAUSEA MEDICATION  *UNUSUAL SHORTNESS OF BREATH  *UNUSUAL BRUISING OR BLEEDING  TENDERNESS IN MOUTH AND THROAT WITH OR WITHOUT PRESENCE OF ULCERS  *URINARY PROBLEMS  *BOWEL PROBLEMS  UNUSUAL RASH Items with * indicate a potential emergency and should be followed up as soon as possible.  Feel free to call the clinic you have any questions or concerns. The clinic phone number is [phone].   I have been informed and understand all the instructions given to me. I know to contact the clinic, my physician, or go to the Emergency Department if any problems should occur. I do not have any questions at this time, but understand that I may call the clinic during office hours   should I have any questions or need assistance in obtaining follow up care.    

## 2012-10-18 NOTE — Progress Notes (Signed)
This office note has been dictated.

## 2012-10-19 NOTE — Progress Notes (Signed)
CC:   Angelia Mould. Derrell Lolling, M.D. Tonita Cong, M.D. [name], M.D. Clarene Duke, MD, Fax [phone] [name], MD, Fax [phone]  IDENTIFYING STATEMENT:  The patient is a 78 year old woman with lymphoma and colon cancer who presents for followup.  INTERVAL HISTORY:  Mrs. Batte presents for followup.  She was last seen in June of this year.  Since that time she has completed radioiodine therapy under the guidance of Dr. Talmage Coin.  She feels well.  Denies B symptoms such as fever, chills and night sweats.  Her weight is stable.  She is not short of breath.  She received a CT scan of the chest, abdomen and pelvis on 10/16/2012.  There are no changes to the small scattered pulmonary nodules.  There were no mediastinal or hilar adenopathy.  The area of the focal irregularity in the right hepatic lobe measuring 1.7 x 2.2 cm had not changed.  The pancreas was unremarkable.  The spleen was normal in size.  There was no abdominal or pelvic adenopathy.  The patient also informs me that she received a colonoscopy within the last month that was also unremarkable.  MEDICATIONS:  Reviewed and updated.  ALLERGIES:  None.  PAST MEDICAL HISTORY/FAMILY HISTORY/SOCIAL HISTORY:  Unchanged.  REVIEW OF SYSTEMS:  A 10 point review of systems negative.  PHYSICAL EXAM:  General:  The patient is alert and oriented x3.  Vitals: Pulse 54, blood pressure 143/63, temperature 97.3, respirations 20. Weight 211 pounds.  HEENT:  Head is atraumatic, normocephalic.  Sclerae anicteric.  Mouth is moist.  Neck:  Supple.  Chest:  Clear to percussion and auscultation.  CVS:  Unremarkable.  Abdomen:  Soft, nontender. Bowel sounds present.  Extremities:  No edema.  LAB DATA:  White cell count 6.5, hemoglobin 13.7, hematocrit 39.2, platelets 162.  Sodium 139, potassium 4.7, chloride 105, CO2 of 25, BUN 21, creatinine 1, glucose 103.  T bilirubin 0.7, alkaline phosphatase 92, AST 19, ALT 15, calcium  9.5.  LDH 185.  Results of CTs as in interval history.  IMPRESSION AND PLAN:  Mrs. Sider is a 78 year old woman with: 1. 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  Status post Rituxan weekly x4 from     03/01/2012 through 03/22/2012.  Her current CTs indicate no     evidence of recurrence.  She will complete Rituxan maintenance     therapy for a total of 4 cycles receiving a dose today 10/30 and a     final dose on 12/29.  Thereafter, she will be re-staged with a CT     Scan at follow up visit in four months time. 2. Stage IV (T1b N1b M0) papillary thyroid carcinoma status post     thyroidectomy with reimplantation of left superior parathyroid     gland on 05/23/2012.  Remains on thyroid replacement therapy per     Dr. Sharl Ma.  She is also status post radioiodine. 3. 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  She     is up to date with colonoscopy (10/13). 4. History of pulmonary embolism on Xarelto. 5. Atrial fibrillation managed by Dr. Verdis Prime.    ______________________________ Laurice Record, M.D. LIO/MEDQ  D:  10/18/2012  T:  10/19/2012  Job:  [id number]

## 2012-12-19 ENCOUNTER — Ambulatory Visit (HOSPITAL_BASED_OUTPATIENT_CLINIC_OR_DEPARTMENT_OTHER): Payer: Medicare Other | Admitting: Lab

## 2012-12-19 ENCOUNTER — Ambulatory Visit (HOSPITAL_BASED_OUTPATIENT_CLINIC_OR_DEPARTMENT_OTHER): Payer: Medicare Other

## 2012-12-19 VITALS — BP 123/84 | HR 54 | Temp 97.8°F | Resp 16

## 2012-12-19 DIAGNOSIS — C8590 Non-Hodgkin lymphoma, unspecified, unspecified site: Secondary | ICD-10-CM

## 2012-12-19 DIAGNOSIS — Z5112 Encounter for antineoplastic immunotherapy: Secondary | ICD-10-CM

## 2012-12-19 DIAGNOSIS — C8587 Other specified types of non-Hodgkin lymphoma, spleen: Secondary | ICD-10-CM

## 2012-12-19 DIAGNOSIS — C180 Malignant neoplasm of cecum: Secondary | ICD-10-CM

## 2012-12-19 DIAGNOSIS — C73 Malignant neoplasm of thyroid gland: Secondary | ICD-10-CM

## 2012-12-19 LAB — CBC WITH DIFFERENTIAL/PLATELET
BASO%: 0.8 % (ref 0.0–2.0)
Eosinophils Absolute: 0.2 10*3/uL (ref 0.0–0.5)
HCT: 39.4 % (ref 34.8–46.6)
LYMPH%: 27.9 % (ref 14.0–49.7)
MCHC: 34.9 g/dL (ref 31.5–36.0)
MONO#: 0.6 10*3/uL (ref 0.1–0.9)
NEUT#: 4.3 10*3/uL (ref 1.5–6.5)
Platelets: 145 10*3/uL (ref 145–400)
RBC: 3.88 10*6/uL (ref 3.70–5.45)
WBC: 7.1 10*3/uL (ref 3.9–10.3)
lymph#: 2 10*3/uL (ref 0.9–3.3)

## 2012-12-19 MED ORDER — ACETAMINOPHEN 325 MG PO TABS
650.0000 mg | ORAL_TABLET | Freq: Once | ORAL | Status: AC
  Administered 2012-12-19: 650 mg via ORAL

## 2012-12-19 MED ORDER — SODIUM CHLORIDE 0.9 % IV SOLN
Freq: Once | INTRAVENOUS | Status: AC
  Administered 2012-12-19: 12:00:00 via INTRAVENOUS

## 2012-12-19 MED ORDER — DIPHENHYDRAMINE HCL 25 MG PO CAPS
50.0000 mg | ORAL_CAPSULE | Freq: Once | ORAL | Status: AC
  Administered 2012-12-19: 50 mg via ORAL

## 2012-12-19 MED ORDER — SODIUM CHLORIDE 0.9 % IV SOLN
375.0000 mg/m2 | Freq: Once | INTRAVENOUS | Status: AC
  Administered 2012-12-19: 800 mg via INTRAVENOUS
  Filled 2012-12-19: qty 80

## 2012-12-19 NOTE — Patient Instructions (Addendum)
[facility] Discharge Instructions for Patients Receiving Chemotherapy  Today you received the following chemotherapy agents Rituxan   To help prevent nausea and vomiting after your treatment, we encourage you to take your nausea medication    If you develop nausea and vomiting that is not controlled by your nausea medication, call the clinic. If it is after clinic hours your family physician or the after hours number for the clinic or go to the Emergency Department.   BELOW ARE SYMPTOMS THAT SHOULD BE REPORTED IMMEDIATELY:  *FEVER GREATER THAN 100.5 F  *CHILLS WITH OR WITHOUT FEVER  NAUSEA AND VOMITING THAT IS NOT CONTROLLED WITH YOUR NAUSEA MEDICATION  *UNUSUAL SHORTNESS OF BREATH  *UNUSUAL BRUISING OR BLEEDING  TENDERNESS IN MOUTH AND THROAT WITH OR WITHOUT PRESENCE OF ULCERS  *URINARY PROBLEMS  *BOWEL PROBLEMS  UNUSUAL RASH Items with * indicate a potential emergency and should be followed up as soon as possible.  One of the nurses will contact you 24 hours after your treatment. Please let the nurse know about any problems that you may have experienced. Feel free to call the clinic you have any questions or concerns. The clinic phone number is [phone].   I have been informed and understand all the instructions given to me. I know to contact the clinic, my physician, or go to the Emergency Department if any problems should occur. I do not have any questions at this time, but understand that I may call the clinic during office hours   should I have any questions or need assistance in obtaining follow up care.    __________________________________________  _____________  __________ Signature of Patient or Authorized Representative            Date                   Time    __________________________________________ Nurse's Signature    

## 2012-12-23 ENCOUNTER — Encounter: Payer: Self-pay | Admitting: Oncology

## 2012-12-23 ENCOUNTER — Telehealth: Payer: Self-pay | Admitting: Oncology

## 2012-12-23 NOTE — Telephone Encounter (Signed)
Former pt of LO re-establishing w/BS. S/w pt today re appt w/BS for 2/27. Pt requesting BS and aware appt was moved from 2/26 to 2/27. Also confirmed 2/21 lb/ct.

## 2013-01-22 ENCOUNTER — Other Ambulatory Visit: Payer: Self-pay | Admitting: Internal Medicine

## 2013-01-22 DIAGNOSIS — C73 Malignant neoplasm of thyroid gland: Secondary | ICD-10-CM

## 2013-02-05 ENCOUNTER — Other Ambulatory Visit: Payer: Medicare Other

## 2013-02-09 ENCOUNTER — Ambulatory Visit (HOSPITAL_COMMUNITY)
Admission: RE | Admit: 2013-02-09 | Discharge: 2013-02-09 | Disposition: A | Discharge status: Home / Self Care | Payer: Medicare Other | Source: Ambulatory Visit | Attending: Hematology and Oncology | Admitting: Hematology and Oncology

## 2013-02-09 ENCOUNTER — Other Ambulatory Visit (HOSPITAL_BASED_OUTPATIENT_CLINIC_OR_DEPARTMENT_OTHER): Payer: Medicare Other

## 2013-02-09 ENCOUNTER — Ambulatory Visit (HOSPITAL_COMMUNITY)
Admission: RE | Admit: 2013-02-09 | Discharge: 2013-02-09 | Disposition: A | Discharge status: Home / Self Care | Payer: Medicare Other | Source: Ambulatory Visit | Attending: Internal Medicine | Admitting: Internal Medicine

## 2013-02-09 DIAGNOSIS — C73 Malignant neoplasm of thyroid gland: Secondary | ICD-10-CM | POA: Insufficient documentation

## 2013-02-09 DIAGNOSIS — C9110 Chronic lymphocytic leukemia of B-cell type not having achieved remission: Secondary | ICD-10-CM | POA: Insufficient documentation

## 2013-02-09 DIAGNOSIS — Z9049 Acquired absence of other specified parts of digestive tract: Secondary | ICD-10-CM | POA: Insufficient documentation

## 2013-02-09 DIAGNOSIS — K5730 Diverticulosis of large intestine without perforation or abscess without bleeding: Secondary | ICD-10-CM | POA: Insufficient documentation

## 2013-02-09 DIAGNOSIS — C189 Malignant neoplasm of colon, unspecified: Secondary | ICD-10-CM | POA: Insufficient documentation

## 2013-02-09 DIAGNOSIS — C8307 Small cell B-cell lymphoma, spleen: Secondary | ICD-10-CM

## 2013-02-09 DIAGNOSIS — Z79899 Other long term (current) drug therapy: Secondary | ICD-10-CM | POA: Insufficient documentation

## 2013-02-09 DIAGNOSIS — R918 Other nonspecific abnormal finding of lung field: Secondary | ICD-10-CM | POA: Insufficient documentation

## 2013-02-09 DIAGNOSIS — N281 Cyst of kidney, acquired: Secondary | ICD-10-CM | POA: Insufficient documentation

## 2013-02-09 DIAGNOSIS — K7689 Other specified diseases of liver: Secondary | ICD-10-CM | POA: Insufficient documentation

## 2013-02-09 DIAGNOSIS — Z9089 Acquired absence of other organs: Secondary | ICD-10-CM | POA: Insufficient documentation

## 2013-02-09 DIAGNOSIS — K4090 Unilateral inguinal hernia, without obstruction or gangrene, not specified as recurrent: Secondary | ICD-10-CM | POA: Insufficient documentation

## 2013-02-09 LAB — CBC WITH DIFFERENTIAL/PLATELET
BASO%: 0.9 % (ref 0.0–2.0)
EOS%: 3.3 % (ref 0.0–7.0)
MCH: 34.5 pg — ABNORMAL HIGH (ref 25.1–34.0)
MCHC: 34.7 g/dL (ref 31.5–36.0)
MONO#: 0.5 10*3/uL (ref 0.1–0.9)
NEUT%: 55.1 % (ref 38.4–76.8)
RBC: 3.97 10*6/uL (ref 3.70–5.45)
RDW: 13 % (ref 11.2–14.5)
WBC: 5.8 10*3/uL (ref 3.9–10.3)
lymph#: 1.9 10*3/uL (ref 0.9–3.3)

## 2013-02-09 LAB — COMPREHENSIVE METABOLIC PANEL (CC13)
ALT: 14 U/L (ref 0–55)
Albumin: 3.7 g/dL (ref 3.5–5.0)
CO2: 30 mEq/L — ABNORMAL HIGH (ref 22–29)
Calcium: 9.2 mg/dL (ref 8.4–10.4)
Chloride: 102 mEq/L (ref 98–107)
Glucose: 101 mg/dl — ABNORMAL HIGH (ref 70–99)
Sodium: 141 mEq/L (ref 136–145)
Total Bilirubin: 0.89 mg/dL (ref 0.20–1.20)
Total Protein: 6.7 g/dL (ref 6.4–8.3)

## 2013-02-09 LAB — LACTATE DEHYDROGENASE (CC13): LDH: 158 U/L (ref 125–245)

## 2013-02-09 MED ORDER — IOHEXOL 300 MG/ML  SOLN
100.0000 mL | Freq: Once | INTRAMUSCULAR | Status: AC | PRN
  Administered 2013-02-09: 100 mL via INTRAVENOUS

## 2013-02-14 ENCOUNTER — Ambulatory Visit: Payer: Medicare Other | Admitting: Hematology and Oncology

## 2013-02-15 ENCOUNTER — Telehealth: Payer: Self-pay | Admitting: Oncology

## 2013-02-15 ENCOUNTER — Ambulatory Visit (HOSPITAL_BASED_OUTPATIENT_CLINIC_OR_DEPARTMENT_OTHER): Payer: Medicare Other | Admitting: Oncology

## 2013-02-15 ENCOUNTER — Ambulatory Visit (HOSPITAL_BASED_OUTPATIENT_CLINIC_OR_DEPARTMENT_OTHER): Payer: Medicare Other | Admitting: Lab

## 2013-02-15 VITALS — BP 172/66 | HR 61 | Temp 98.2°F | Resp 20 | Ht 67.5 in | Wt 221.8 lb

## 2013-02-15 DIAGNOSIS — C189 Malignant neoplasm of colon, unspecified: Secondary | ICD-10-CM

## 2013-02-15 DIAGNOSIS — C8589 Other specified types of non-Hodgkin lymphoma, extranodal and solid organ sites: Secondary | ICD-10-CM

## 2013-02-15 DIAGNOSIS — I4891 Unspecified atrial fibrillation: Secondary | ICD-10-CM

## 2013-02-15 DIAGNOSIS — C180 Malignant neoplasm of cecum: Secondary | ICD-10-CM

## 2013-02-15 DIAGNOSIS — C73 Malignant neoplasm of thyroid gland: Secondary | ICD-10-CM

## 2013-02-15 DIAGNOSIS — C8587 Other specified types of non-Hodgkin lymphoma, spleen: Secondary | ICD-10-CM

## 2013-02-15 NOTE — Progress Notes (Signed)
   [facility]    OFFICE PROGRESS NOTE   INTERVAL HISTORY:   She returns as scheduled. No fever, night sweats, or anorexia. Good energy level. No complaint. She continues xarelto.  Objective:  Vital signs in last 24 hours:  Blood pressure 172/66, pulse 61, temperature 98.2 F (36.8 C), temperature source Oral, resp. rate 20, height 5' 7.5" (1.715 m), weight 221 lb 12.8 oz (100.608 kg).    HEENT: Neck without mass Lymphatics: No cervical, supraclavicular, axillary, or inguinal nodes Resp: Lungs clear bilaterally Cardio: Regular rate and rhythm GI: No hepatosplenomegaly, nontender, no mass Vascular: No leg edema   Lab Results:  Lab Results  Component Value Date   WBC 5.8 02/09/2013   HGB 13.7 02/09/2013   HCT 39.4 02/09/2013   MCV 99.2 02/09/2013   PLT 153 02/09/2013   ANC 3.2   X-rays: CTs of the chest, abdomen, and pelvis on 02/09/2013 were compared to CT scan from 10/16/2012. Stable 6 mm pretracheal node. No evidence of new/progressive metastatic disease in the chest. Stable tiny pulmonary nodules. Stable irregular lesion along the posterior inferior right hepatic lobe. The spleen is normal. No suspicious abdominal or pelvic lymphadenopathy. Postsurgical changes at the cecum.    Medications: I have reviewed the patient's current medications.  Assessment/Plan:  1. 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 Status post weekly Rituxan x4 03/01/2012 through 03/22/2012. She completed for "maintenance "doses of Rituxan, last on 12/19/2012. A restaging CT on 02/09/2013 showed no evidence of lymphoma.  2. Stage IV (T1bN1b M0) papillary thyroid cancer, status post a thyroidectomy with reimplantation of the left superior parathyroid gland on 05/23/2012, status post radioactive iodine therapy, followed by Dr. Sharl Ma  3. Stage II (T3 N0) colon cancer, status post a  right colectomy 10/19/2011, last colonoscopy October 2013  4. History of a pulmonary embolism December 2012  5. Atrial fibrillation-maintained on Xarelto  Disposition:  She remains in clinical remission from colon cancer and the low-grade lymphoma. The plan is to follow her with an observation approach. She will return for an office visit and CBC/CEA in 6 months. We will add a CEA to the labs from 02/09/2013. She will contact us in the interim for new symptoms.   Thornton Papas, MD  02/15/2013  11:59 AM

## 2013-02-21 ENCOUNTER — Encounter: Payer: Self-pay | Admitting: *Deleted

## 2013-07-23 ENCOUNTER — Encounter (INDEPENDENT_AMBULATORY_CARE_PROVIDER_SITE_OTHER): Payer: Self-pay | Admitting: General Surgery

## 2013-07-25 ENCOUNTER — Other Ambulatory Visit: Payer: Self-pay

## 2013-08-14 ENCOUNTER — Ambulatory Visit: Payer: Medicare Other | Admitting: Oncology

## 2013-08-16 ENCOUNTER — Other Ambulatory Visit (HOSPITAL_BASED_OUTPATIENT_CLINIC_OR_DEPARTMENT_OTHER): Payer: Medicare Other

## 2013-08-16 DIAGNOSIS — C189 Malignant neoplasm of colon, unspecified: Secondary | ICD-10-CM

## 2013-08-16 DIAGNOSIS — C8589 Other specified types of non-Hodgkin lymphoma, extranodal and solid organ sites: Secondary | ICD-10-CM

## 2013-08-16 LAB — CEA: CEA: 3.1 ng/mL (ref 0.0–5.0)

## 2013-08-16 LAB — CBC WITH DIFFERENTIAL/PLATELET
BASO%: 0.7 % (ref 0.0–2.0)
Eosinophils Absolute: 0.3 10*3/uL (ref 0.0–0.5)
MCHC: 34.1 g/dL (ref 31.5–36.0)
MONO#: 0.6 10*3/uL (ref 0.1–0.9)
MONO%: 8.1 % (ref 0.0–14.0)
NEUT#: 4.6 10*3/uL (ref 1.5–6.5)
RBC: 3.77 10*6/uL (ref 3.70–5.45)
RDW: 12.7 % (ref 11.2–14.5)
WBC: 7.8 10*3/uL (ref 3.9–10.3)

## 2013-09-13 ENCOUNTER — Ambulatory Visit (INDEPENDENT_AMBULATORY_CARE_PROVIDER_SITE_OTHER): Payer: Medicare Other | Admitting: General Surgery

## 2013-09-17 ENCOUNTER — Ambulatory Visit (HOSPITAL_BASED_OUTPATIENT_CLINIC_OR_DEPARTMENT_OTHER): Payer: Medicare Other | Admitting: Oncology

## 2013-09-17 ENCOUNTER — Telehealth: Payer: Self-pay | Admitting: Oncology

## 2013-09-17 VITALS — BP 135/56 | HR 67 | Temp 97.6°F | Resp 19 | Ht 67.5 in | Wt 216.8 lb

## 2013-09-17 DIAGNOSIS — C8307 Small cell B-cell lymphoma, spleen: Secondary | ICD-10-CM

## 2013-09-17 DIAGNOSIS — C180 Malignant neoplasm of cecum: Secondary | ICD-10-CM

## 2013-09-17 DIAGNOSIS — C189 Malignant neoplasm of colon, unspecified: Secondary | ICD-10-CM

## 2013-09-17 DIAGNOSIS — C8590 Non-Hodgkin lymphoma, unspecified, unspecified site: Secondary | ICD-10-CM

## 2013-09-17 NOTE — Progress Notes (Signed)
   [facility]    OFFICE PROGRESS NOTE   INTERVAL HISTORY:   Tracy Mclaughlin returns for scheduled followup of non-Hodgkin's lymphoma and colon cancer. She feels well. Good appetite. No difficulty with bowel function. She continues xarelto anticoagulation.  Objective:  Vital signs in last 24 hours:  Blood pressure 135/56, pulse 67, temperature 97.6 F (36.4 C), temperature source Oral, resp. rate 19, height 5' 7.5" (1.715 m), weight 216 lb 12.8 oz (98.34 kg).    HEENT: Neck without mass Lymphatics: No cervical, supraclavicular, axillary, or inguinal nodes Resp: Lungs clear bilaterally Cardio: Irregular GI: No hepatosplenomegaly, nontender, no mass Vascular: No leg edema   Lab Results:  Lab Results  Component Value Date   WBC 7.8 08/16/2013   HGB 12.4 08/16/2013   HCT 36.3 08/16/2013   MCV 96.2 08/16/2013   PLT 187 08/16/2013   ANC 4.6, absolute lymphocyte count 2.3  CEA -3.1    Medications: I have reviewed the patient's current medications.  Assessment/Plan: 1. 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 Status post weekly Rituxan x4 03/01/2012 through 03/22/2012. She completed 4 "maintenance "doses of Rituxan, last on 12/19/2012. A restaging CT on 02/09/2013 showed no evidence of lymphoma.  2. Stage IV (T1bN1b M0) papillary thyroid cancer, status post a thyroidectomy with reimplantation of the left superior parathyroid gland on 05/23/2012, status post radioactive iodine therapy, followed by Dr. Sharl Mclaughlin  3. Stage II (T3 N0) colon cancer, status post a right colectomy 10/19/2011, last colonoscopy October 2013  4. History of a pulmonary embolism December 2012  5. Atrial fibrillation-maintained on Xarelto     Disposition:  Tracy Mclaughlin remains in clinical remission from non-Hodgkin's lymphoma and colon cancer. She will return for an office visit, CBC, and CEA in 6  months. She will obtain an influenza vaccine this year.   Tracy Papas, MD  09/17/2013  12:50 PM

## 2013-09-17 NOTE — Telephone Encounter (Signed)
GV AND PRINTED APPT SCHED AND AVS FOR PT FOR mARCH 2015

## 2013-09-19 MED ORDER — DEXAMETHASONE SODIUM PHOSPHATE 10 MG/ML IJ SOLN
INTRAMUSCULAR | Status: AC
  Filled 2013-09-19: qty 1

## 2013-09-19 MED ORDER — ONDANSETRON 8 MG/NS 50 ML IVPB
INTRAVENOUS | Status: AC
  Filled 2013-09-19: qty 8

## 2013-10-25 ENCOUNTER — Other Ambulatory Visit: Payer: Self-pay

## 2013-11-14 ENCOUNTER — Other Ambulatory Visit: Payer: Self-pay | Admitting: *Deleted

## 2013-11-14 DIAGNOSIS — I4891 Unspecified atrial fibrillation: Secondary | ICD-10-CM

## 2013-12-26 ENCOUNTER — Other Ambulatory Visit: Payer: Medicare Other

## 2014-02-06 ENCOUNTER — Other Ambulatory Visit (HOSPITAL_COMMUNITY): Payer: Self-pay | Admitting: Internal Medicine

## 2014-02-06 ENCOUNTER — Other Ambulatory Visit: Payer: Self-pay | Admitting: Internal Medicine

## 2014-02-06 DIAGNOSIS — C73 Malignant neoplasm of thyroid gland: Secondary | ICD-10-CM

## 2014-02-14 ENCOUNTER — Ambulatory Visit (HOSPITAL_COMMUNITY): Payer: Medicare Other

## 2014-02-18 ENCOUNTER — Other Ambulatory Visit (HOSPITAL_COMMUNITY): Payer: Medicare Other

## 2014-02-21 ENCOUNTER — Ambulatory Visit (HOSPITAL_COMMUNITY)
Admission: RE | Admit: 2014-02-21 | Discharge: 2014-02-21 | Disposition: A | Discharge status: Home / Self Care | Payer: Medicare Other | Source: Ambulatory Visit | Attending: Internal Medicine | Admitting: Internal Medicine

## 2014-02-21 DIAGNOSIS — C73 Malignant neoplasm of thyroid gland: Secondary | ICD-10-CM

## 2014-03-18 ENCOUNTER — Telehealth: Payer: Self-pay | Admitting: Oncology

## 2014-03-18 ENCOUNTER — Other Ambulatory Visit (HOSPITAL_BASED_OUTPATIENT_CLINIC_OR_DEPARTMENT_OTHER): Payer: Medicare Other

## 2014-03-18 ENCOUNTER — Ambulatory Visit (HOSPITAL_BASED_OUTPATIENT_CLINIC_OR_DEPARTMENT_OTHER): Payer: Medicare Other | Admitting: Oncology

## 2014-03-18 VITALS — BP 149/64 | HR 76 | Temp 97.9°F | Resp 18 | Ht 67.0 in | Wt 220.8 lb

## 2014-03-18 DIAGNOSIS — C189 Malignant neoplasm of colon, unspecified: Secondary | ICD-10-CM

## 2014-03-18 DIAGNOSIS — C73 Malignant neoplasm of thyroid gland: Secondary | ICD-10-CM

## 2014-03-18 DIAGNOSIS — D509 Iron deficiency anemia, unspecified: Secondary | ICD-10-CM

## 2014-03-18 DIAGNOSIS — I4891 Unspecified atrial fibrillation: Secondary | ICD-10-CM

## 2014-03-18 DIAGNOSIS — C8590 Non-Hodgkin lymphoma, unspecified, unspecified site: Secondary | ICD-10-CM

## 2014-03-18 DIAGNOSIS — D649 Anemia, unspecified: Secondary | ICD-10-CM

## 2014-03-18 DIAGNOSIS — C8589 Other specified types of non-Hodgkin lymphoma, extranodal and solid organ sites: Secondary | ICD-10-CM

## 2014-03-18 DIAGNOSIS — I2699 Other pulmonary embolism without acute cor pulmonale: Secondary | ICD-10-CM

## 2014-03-18 LAB — CBC WITH DIFFERENTIAL/PLATELET
BASO%: 0.6 % (ref 0.0–2.0)
Basophils Absolute: 0 10*3/uL (ref 0.0–0.1)
EOS%: 2.1 % (ref 0.0–7.0)
Eosinophils Absolute: 0.1 10*3/uL (ref 0.0–0.5)
HEMATOCRIT: 29.5 % — AB (ref 34.8–46.6)
LYMPH#: 1.5 10*3/uL (ref 0.9–3.3)
LYMPH%: 22.2 % (ref 14.0–49.7)
MCH: 24.6 pg — AB (ref 25.1–34.0)
MCHC: 30.2 g/dL — AB (ref 31.5–36.0)
MCV: 81.5 fL (ref 79.5–101.0)
MONO#: 0.7 10*3/uL (ref 0.1–0.9)
MONO%: 10.4 % (ref 0.0–14.0)
NEUT#: 4.4 10*3/uL (ref 1.5–6.5)
NEUT%: 64.7 % (ref 38.4–76.8)
Platelets: 233 10*3/uL (ref 145–400)
RBC: 3.62 10*6/uL — ABNORMAL LOW (ref 3.70–5.45)
RDW: 14.4 % (ref 11.2–14.5)
WBC: 6.7 10*3/uL (ref 3.9–10.3)
nRBC: 0 % (ref 0–0)

## 2014-03-18 LAB — RETICULOCYTES
IMMATURE RETIC FRACT: 18.6 % — AB (ref 1.60–10.00)
RBC: 3.62 10*6/uL — AB (ref 3.70–5.45)
Retic %: 2.26 % — ABNORMAL HIGH (ref 0.70–2.10)
Retic Ct Abs: 81.81 10*3/uL (ref 33.70–90.70)

## 2014-03-18 LAB — CEA: CEA: 3 ng/mL (ref 0.0–5.0)

## 2014-03-18 LAB — FERRITIN CHCC: Ferritin: 6 ng/ml — ABNORMAL LOW (ref 9–269)

## 2014-03-18 LAB — CHCC SMEAR

## 2014-03-18 NOTE — Progress Notes (Addendum)
  [place] OFFICE PROGRESS NOTE   Diagnosis: Non-Hodgkin's lymphoma  INTERVAL HISTORY:   Ms. Jaskolski returns as scheduled. She denies fever, night sweats, and anorexia. She has chronic hot flashes. She complains of constipation and exertional dyspnea. No bleeding. No palpable lymph nodes.  Objective:  Vital signs in last 24 hours:  Blood pressure 149/64, pulse 76, temperature 97.9 F (36.6 C), temperature source Oral, resp. rate 18, height 5\' 7"  (1.702 m), weight 220 lb 12.8 oz (100.154 kg), SpO2 99.00%.    HEENT: The conjunctivae are pale, neck without mass Lymphatics: No cervical, supraclavicular, axillary, or inguinal nodes Resp: Lungs clear bilaterally Cardio: Regular rate and rhythm GI: No hepatosplenomegaly, nontender Vascular: No leg edema   Lab Results:  Lab Results  Component Value Date   WBC 6.7 03/18/2014   HGB 8.9 Repeated and Verified* 03/18/2014   HCT 29.5* 03/18/2014   MCV 81.5 03/18/2014   PLT 233 03/18/2014   NEUTROABS 4.4 03/18/2014     Lab Results  Component Value Date   CEA 3.1 08/16/2013    Imaging:  No results found.  Medications: I have reviewed the patient's current medications.  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 Status post weekly Rituxan x4 03/01/2012 through 03/22/2012. She completed 4 "maintenance "doses of Rituxan, last on 12/19/2012. A restaging CT on 02/09/2013 showed no evidence of lymphoma.  2. Stage IV (T1bN1b M0) papillary thyroid cancer, status post a thyroidectomy with reimplantation of the left superior parathyroid gland on 05/23/2012, status post radioactive iodine therapy, followed by Dr. Buddy Duty  3. Stage II (T3 N0) colon cancer, status post a right colectomy 10/19/2011, last colonoscopy October 2013  4. History of a pulmonary embolism December 2012  5. Atrial fibrillation-maintained  on Xarelto  6. Anemia-new   Disposition:  Ms. Murtaugh remains in clinical remission from non-Hodgkin's, and colon cancer. She has developed anemia. I am concerned the anemia may be related to occult bleeding. The MCV is lower. She complains of constipation.  Ms. Cobbins will return a set of stool Hemoccult cards. We will check the ferritin level and I will review the blood smear today. She will contact us for increased dyspnea. Ms. Xu will return for an office visit in 2 weeks. She will begin a stool softener.  We will make a GI referral if she has iron deficiency. The differential diagnosis includes anemia related to non-Hodgkin's lymphoma and metastatic thyroid cancer. I think it is unlikely the marginal zone lymphoma is causing the anemia as the marked lymphocytosis has resolved.  Betsy Coder, MD  03/18/2014  12:29 PM   Peripheral blood smear 03/18/2014: The platelets are normal in number. The white cell morphology is remarkable for a few 5 lobed neutrophils, no monotonous population. The red cells are hypochromic and microcytic. A few ovalocytes and teardrop forms. The polychromasia is mildly increased.

## 2014-03-18 NOTE — Telephone Encounter (Signed)
gv pt appt schedule for april. s/w edie in lab and pt does not need to return for add on lab - enough blood drawn earlier today.

## 2014-03-19 ENCOUNTER — Other Ambulatory Visit: Payer: Self-pay | Admitting: *Deleted

## 2014-03-19 ENCOUNTER — Telehealth: Payer: Self-pay | Admitting: *Deleted

## 2014-03-19 DIAGNOSIS — D509 Iron deficiency anemia, unspecified: Secondary | ICD-10-CM

## 2014-03-19 MED ORDER — FERROUS SULFATE 325 (65 FE) MG PO TBEC: 325.0000 mg | DELAYED_RELEASE_TABLET | Freq: Two times a day (BID) | ORAL | Status: DC

## 2014-03-19 NOTE — Telephone Encounter (Signed)
Pt called requesting lab results. Iron deficient, per Dr. Benay Spice. Begin ferrous sulfate BID. Refer to [place]. Follow up here as scheduled. Pt voiced understanding.

## 2014-03-20 ENCOUNTER — Encounter: Payer: Self-pay | Admitting: Internal Medicine

## 2014-04-02 ENCOUNTER — Ambulatory Visit (INDEPENDENT_AMBULATORY_CARE_PROVIDER_SITE_OTHER): Payer: Medicare Other | Admitting: Internal Medicine

## 2014-04-02 ENCOUNTER — Ambulatory Visit (HOSPITAL_BASED_OUTPATIENT_CLINIC_OR_DEPARTMENT_OTHER): Payer: Medicare Other | Admitting: Nurse Practitioner

## 2014-04-02 ENCOUNTER — Telehealth: Payer: Self-pay | Admitting: Oncology

## 2014-04-02 ENCOUNTER — Encounter: Payer: Self-pay | Admitting: Internal Medicine

## 2014-04-02 ENCOUNTER — Encounter: Payer: Self-pay | Admitting: *Deleted

## 2014-04-02 ENCOUNTER — Telehealth: Payer: Self-pay

## 2014-04-02 ENCOUNTER — Other Ambulatory Visit (HOSPITAL_BASED_OUTPATIENT_CLINIC_OR_DEPARTMENT_OTHER): Payer: Medicare Other

## 2014-04-02 VITALS — BP 126/53 | HR 76 | Temp 97.8°F | Resp 20 | Ht 67.25 in | Wt 221.3 lb

## 2014-04-02 VITALS — BP 130/72 | HR 80 | Ht 67.25 in | Wt 220.5 lb

## 2014-04-02 DIAGNOSIS — Z86711 Personal history of pulmonary embolism: Secondary | ICD-10-CM

## 2014-04-02 DIAGNOSIS — C189 Malignant neoplasm of colon, unspecified: Secondary | ICD-10-CM

## 2014-04-02 DIAGNOSIS — Z85038 Personal history of other malignant neoplasm of large intestine: Secondary | ICD-10-CM

## 2014-04-02 DIAGNOSIS — Z7901 Long term (current) use of anticoagulants: Secondary | ICD-10-CM | POA: Insufficient documentation

## 2014-04-02 DIAGNOSIS — C73 Malignant neoplasm of thyroid gland: Secondary | ICD-10-CM

## 2014-04-02 DIAGNOSIS — I4891 Unspecified atrial fibrillation: Secondary | ICD-10-CM

## 2014-04-02 DIAGNOSIS — D509 Iron deficiency anemia, unspecified: Secondary | ICD-10-CM

## 2014-04-02 DIAGNOSIS — D649 Anemia, unspecified: Secondary | ICD-10-CM

## 2014-04-02 DIAGNOSIS — C8589 Other specified types of non-Hodgkin lymphoma, extranodal and solid organ sites: Secondary | ICD-10-CM

## 2014-04-02 DIAGNOSIS — R195 Other fecal abnormalities: Secondary | ICD-10-CM | POA: Insufficient documentation

## 2014-04-02 LAB — CBC WITH DIFFERENTIAL/PLATELET
BASO%: 0.7 % (ref 0.0–2.0)
BASOS ABS: 0.1 10*3/uL (ref 0.0–0.1)
EOS ABS: 0.1 10*3/uL (ref 0.0–0.5)
EOS%: 1.3 % (ref 0.0–7.0)
HEMATOCRIT: 36.3 % (ref 34.8–46.6)
HEMOGLOBIN: 11.3 g/dL — AB (ref 11.6–15.9)
LYMPH%: 24.6 % (ref 14.0–49.7)
MCH: 26.6 pg (ref 25.1–34.0)
MCHC: 31.1 g/dL — ABNORMAL LOW (ref 31.5–36.0)
MCV: 85.3 fL (ref 79.5–101.0)
MONO#: 0.7 10*3/uL (ref 0.1–0.9)
MONO%: 8.6 % (ref 0.0–14.0)
NEUT%: 64.8 % (ref 38.4–76.8)
NEUTROS ABS: 5.1 10*3/uL (ref 1.5–6.5)
PLATELETS: 230 10*3/uL (ref 145–400)
RBC: 4.26 10*6/uL (ref 3.70–5.45)
RDW: 24 % — ABNORMAL HIGH (ref 11.2–14.5)
WBC: 7.9 10*3/uL (ref 3.9–10.3)
lymph#: 2 10*3/uL (ref 0.9–3.3)

## 2014-04-02 LAB — FECAL OCCULT BLOOD, GUAIAC: Occult Blood: POSITIVE

## 2014-04-02 MED ORDER — NA SULFATE-K SULFATE-MG SULF 17.5-3.13-1.6 GM/177ML PO SOLN: ORAL | Status: DC

## 2014-04-02 NOTE — Telephone Encounter (Signed)
Gave pt appt for lab and Md for August 2015 °

## 2014-04-02 NOTE — Progress Notes (Addendum)
  [facility] OFFICE PROGRESS NOTE   Diagnosis:  Non-Hodgkin's lymphoma, stage II colon cancer, iron deficiency anemia.  INTERVAL HISTORY:   Ms. Winfree returns for scheduled followup. She continues oral iron twice daily. She notes that stools are dark since beginning iron. She reports developing constipation several months ago. The constipation resolved when she began a stool softener. She continues to feel "tired". She has stable dyspnea on exertion. No chest pain. She denies bleeding.  Objective:  Vital signs in last 24 hours:  Blood pressure 126/53, pulse 76, temperature 97.8 F (36.6 C), temperature source Oral, resp. rate 20, height 5' 7.25" (1.708 m), weight 221 lb 4.8 oz (100.381 kg), SpO2 100.00%.    HEENT: No thrush or ulcerations. Lymphatics: No palpable cervical, supraclavicular, axillary or inguinal lymph nodes. Resp: Lungs clear. Cardio: Regular cardiac rhythm. GI: Abdomen soft and nontender. No organomegaly. Vascular: No leg edema.   Lab Results:  Lab Results  Component Value Date   WBC 7.9 04/02/2014   HGB 11.3* 04/02/2014   HCT 36.3 04/02/2014   MCV 85.3 04/02/2014   PLT 230 04/02/2014   NEUTROABS 5.1 04/02/2014   04/02/2014 stool positive for occult blood x3. 03/18/2014 ferritin 6. 03/18/2014 CEA 3.0. Imaging:  No results found.  Medications: I have reviewed the patient's current medications.  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 Status post weekly Rituxan x4 03/01/2012 through 03/22/2012. She completed 4 "maintenance "doses of Rituxan, last on 12/19/2012. A restaging CT on 02/09/2013 showed no evidence of lymphoma.  2. Stage IV (T1bN1b M0) papillary thyroid cancer, status post a thyroidectomy with reimplantation of the left superior parathyroid gland on 05/23/2012, status post radioactive iodine therapy, followed by  Dr. Buddy Duty  3. Stage II (T3 N0) colon cancer, status post a right colectomy 10/19/2011, last colonoscopy October 2013.  4. History of a pulmonary embolism December 2012.  5. Atrial fibrillation-maintained on Xarelto.  6. Anemia-new 03/18/2014. Ferritin 6. Stool positive for occult blood. Oral iron initiated.    Disposition: Ms. Shores has iron deficiency anemia. Stool is positive for occult blood. She has seen Dr. Carlean Purl with endoscopic evaluation planned.  Her hemoglobin has improved since beginning oral iron. She will continue the iron and return for a CBC in 2 months. We scheduled a four-month office visit. That appointment will be adjusted accordingly pending the endoscopic evaluation.  She will contact the office prior to her next appointment with any problems.  Patient seen with Dr. Benay Spice.    Owens Shark ANP/GNP-BC   04/02/2014  4:44 PM  This was a shared visit with Ned Card. She has iron deficiency anemia.  The Hb has improved with oral iron.  Dr. Carlean Purl will evaluate for a source of blood loss.  Julieanne Manson, MD

## 2014-04-02 NOTE — Telephone Encounter (Signed)
talked to pt, gave her appt for June lab only

## 2014-04-02 NOTE — Progress Notes (Signed)
Stool hemoccult cards received in mail today. Forwarded to lab

## 2014-04-02 NOTE — Patient Instructions (Signed)
You have been scheduled for an endoscopy and colonoscopy with propofol. Please follow the written instructions given to you at your visit today. Please use the prep kit you have been given today. If you use inhalers (even only as needed), please bring them with you on the day of your procedure. Your physician has requested that you go to www.startemmi.com and enter the access code given to you at your visit today. This web site gives a general overview about your procedure. However, you should still follow specific instructions given to you by our office regarding your preparation for the procedure.  You will be contaced by our office prior to your procedure for directions on holding your Xarelto.  If you do not hear from our office 1 week prior to your scheduled procedure, please call [phone] to discuss.   I appreciate the opportunity to care for you.   

## 2014-04-02 NOTE — Progress Notes (Addendum)
Subjective:    Patient ID: Tracy Mclaughlin, female    DOB: May 26, 1934, 80 y.o.   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 Lab Results  Component Value Date   FERRITIN 6* 03/18/2014   She is followed by Dr. Benay Spice for history of colon (cecal)cancer resected in 2012 [facility]), thyroid cancer and B-cell CLL. She has a hx of Afib and DVT and takes Xarelto. She has not noticed any bleeding, change in bowels or abdominal pain but does have heme + stools Some fatigue noted. Improved after Iron Tx. GI ROs o/w negative  No Known Allergies Outpatient Prescriptions Prior to Visit  Medication Sig Dispense Refill  . citalopram (CELEXA) 10 MG tablet Take 40 mg by mouth daily.       Marland Kitchen diltiazem (DILACOR XR) 120 MG 24 hr capsule Daily.      . diphenhydrAMINE (BENADRYL) 25 MG tablet Take 25 mg by mouth at bedtime as needed. For itching/sleep      . ferrous sulfate 325 (65 FE) MG EC tablet Take 1 tablet (325 mg total) by mouth 2 (two) times daily with a meal.  60 tablet  3  . Rivaroxaban (XARELTO) 20 MG TABS Take 1 tablet by mouth daily.      Marland Kitchen levothyroxine (SYNTHROID, LEVOTHROID) 75 MCG tablet Take 250 mcg by mouth daily. Takes 250 mcg per day       No facility-administered medications prior to visit.   Past Medical History  Diagnosis Date  . Hypertension   . Arthritis   . Subarachnoid hemorrhage 1996  . Diverticulosis   . Non Hodgkin's lymphoma     Non-Hodgkin's B-Cell Lymphoma  . Cancer     Thyroid/Colon/ B cell Lymphoma-remission  . History of atrial fibrillation     Was in only in a- fib for 2 days  . Pulmonary embolus 11/2011  . Thyroid disease   . Paroxysmal atrial fibrillation   . Papillary thyroid carcinoma   . Post-surgical hypothyroidism   . Atrial fibrillation   . Anemia    Past Surgical History  Procedure Laterality Date  . Knee arthroscopy Left 1998    S/P  Arthroscopic Left knee  surgery  . Tuboplasty / tubotubal anastomosis    . Subdural hematoma evacuation via craniotomy    . Rotator cuff repair Right 1998  . Colon resection  11/12    mucinous adenocarcinoma of the cecum, resected  . Thyroidectomy  05/23/2012    Procedure: THYROIDECTOMY;  Surgeon: [name], MD;  Location: [facility];  Service: General;  Laterality: N/A;  Total thyroidectomy, central compartment lymph node biopsy times two, reimplantation left superior parathyroid gland.  Marland Kitchen Appendectomy  1959  . Cholecystectomy  1999    S/P Lap Cholecystectomy  . Colonoscopy  10/05/2011   History   Social History  . Marital Status: Married    Spouse Name: N/A    Number of Children: 0  . Years of Education: N/A   Occupational History  . retired    Social History Main Topics  . Smoking status: Former Smoker -- 1.00 packs/day for 12 years    Types: Cigarettes    Quit date: 08/23/1969  . Smokeless tobacco: Never Used  . Alcohol Use: Yes     Comment: 1 manhattens per night  . Drug Use: No  . Sexual Activity: None   Other Topics Concern  . None  Social History Narrative  . None   Family History  Problem Relation Age of Onset  . Kidney cancer Sister     ?  Marland Kitchen Hypertension Sister   . Lung cancer Brother   . Anesthesia problems Neg Hx   . Hypertension Mother   . Diabetes Mother   . Coronary artery disease Mother   . Diabetes Sister     x 2         Review of Systems As mentioned above, all other ROS negative    Objective:   Physical Exam General:  Well-developed, well-nourished and in no acute distress Eyes:  anicteric. ENT:   Mouth and posterior pharynx free of lesions except + torus palatinis and partial dentures Neck:   supple w/o thyromegaly or mass.  Lungs: Clear to auscultation bilaterally. Heart:  S1S2, no rubs, murmurs, gallops. Abdomen: Mildly obese soft, non-tender, no hepatosplenomegaly, hernia, or mass and BS+.  Rectal: deferred Lymph:  no cervical or supraclavicular  adenopathy. Extremities:   no edema Skin   no rash. Neuro:  A&O x 3.  Psych:  appropriate mood and  Affect.   Data Reviewed: Labs, oncology, cardiology notes, CT scans in EMR Op notes and colonoscopy notes from [facility] 2012    Assessment & Plan:  Heme + stool - Plan: Ambulatory referral to Gastroenterology, 0.9 %  sodium chloride infusion, Ambulatory referral to Gastroenterology  Anemia, iron deficiency - Plan: Ambulatory referral to Gastroenterology, 0.9 %  sodium chloride infusion, Ambulatory referral to Gastroenterology  Personal history of colon cancer - Plan: Ambulatory referral to Gastroenterology, 0.9 %  sodium chloride infusion, Ambulatory referral to Gastroenterology  Long term (current) use of anticoagulants-xarelto - afib and hx PE  Needs an EGD and colonoscopy to sort out cause of anemia. If unrevealing need to consider capsule endoscopy of small bowel. Needs to hold Xarelto 1-2 days before the colonoscopy - will see if ok by checking with Dr. Tamala Julian (cardiology). Will do at [facility] and have APC available in case she has AVM's  The risks and benefits as well as alternatives of endoscopic procedure(s) have been discussed and reviewed. All questions answered. The patient agrees to proceed. Extra risk of stroke or thrombosis off Xarelto reviewed.  NL:[id number], MD Julieanne Manson, MD

## 2014-04-02 NOTE — Telephone Encounter (Signed)
[facility] [address] [place] [address]  04/02/2014   RE: Taysha Majewski DOB: 1934-10-02 MRN: [id number]   Dear Daneen Schick M.D.,    We have scheduled the above patient for an endoscopic procedure. Our records show that she is on anticoagulation therapy.   Please advise as to how long the patient may come off her therapy of xarelto prior to the colonoscopy/EGD procedure, which is scheduled for 04/17/14 at 10:00am.  Please fax back/ or route the completed form to [facility] [place] at [phone].   Sincerely,    Silvano Rusk M.D.

## 2014-04-09 NOTE — Telephone Encounter (Signed)
Re-faxed the letter to Dr. Daneen Schick awaiting an answer.

## 2014-04-10 NOTE — Telephone Encounter (Signed)
Per Dr. Daneen Schick ok to hold the xarelto 48 hours.  Patient informed and verbalized understanding.

## 2014-04-17 ENCOUNTER — Ambulatory Visit (HOSPITAL_COMMUNITY)
Admission: RE | Admit: 2014-04-17 | Discharge: 2014-04-17 | Disposition: A | Discharge status: Home / Self Care | Payer: Medicare Other | Source: Ambulatory Visit | Attending: Internal Medicine | Admitting: Internal Medicine

## 2014-04-17 ENCOUNTER — Encounter (HOSPITAL_COMMUNITY)
Admission: RE | Disposition: A | Discharge status: Home / Self Care | Payer: Self-pay | Source: Ambulatory Visit | Attending: Internal Medicine

## 2014-04-17 DIAGNOSIS — C8589 Other specified types of non-Hodgkin lymphoma, extranodal and solid organ sites: Secondary | ICD-10-CM | POA: Insufficient documentation

## 2014-04-17 DIAGNOSIS — D509 Iron deficiency anemia, unspecified: Secondary | ICD-10-CM

## 2014-04-17 DIAGNOSIS — M129 Arthropathy, unspecified: Secondary | ICD-10-CM | POA: Insufficient documentation

## 2014-04-17 DIAGNOSIS — Z85850 Personal history of malignant neoplasm of thyroid: Secondary | ICD-10-CM | POA: Insufficient documentation

## 2014-04-17 DIAGNOSIS — D126 Benign neoplasm of colon, unspecified: Secondary | ICD-10-CM

## 2014-04-17 DIAGNOSIS — I10 Essential (primary) hypertension: Secondary | ICD-10-CM | POA: Insufficient documentation

## 2014-04-17 DIAGNOSIS — Z7901 Long term (current) use of anticoagulants: Secondary | ICD-10-CM | POA: Insufficient documentation

## 2014-04-17 DIAGNOSIS — Z86718 Personal history of other venous thrombosis and embolism: Secondary | ICD-10-CM | POA: Insufficient documentation

## 2014-04-17 DIAGNOSIS — Z85038 Personal history of other malignant neoplasm of large intestine: Secondary | ICD-10-CM

## 2014-04-17 DIAGNOSIS — R195 Other fecal abnormalities: Secondary | ICD-10-CM

## 2014-04-17 DIAGNOSIS — Z87891 Personal history of nicotine dependence: Secondary | ICD-10-CM | POA: Insufficient documentation

## 2014-04-17 DIAGNOSIS — K5730 Diverticulosis of large intestine without perforation or abscess without bleeding: Secondary | ICD-10-CM | POA: Insufficient documentation

## 2014-04-17 DIAGNOSIS — Z860100 Personal history of colon polyps, unspecified: Secondary | ICD-10-CM | POA: Diagnosis present

## 2014-04-17 DIAGNOSIS — Z79899 Other long term (current) drug therapy: Secondary | ICD-10-CM | POA: Insufficient documentation

## 2014-04-17 DIAGNOSIS — I4891 Unspecified atrial fibrillation: Secondary | ICD-10-CM | POA: Insufficient documentation

## 2014-04-17 HISTORY — PX: ESOPHAGOGASTRODUODENOSCOPY: SHX5428

## 2014-04-17 HISTORY — PX: COLONOSCOPY: SHX5424

## 2014-04-17 SURGERY — EGD (ESOPHAGOGASTRODUODENOSCOPY)
Anesthesia: Moderate Sedation

## 2014-04-17 MED ORDER — MIDAZOLAM HCL 10 MG/2ML IJ SOLN
INTRAMUSCULAR | Status: DC | PRN
  Administered 2014-04-17: 1 mg via INTRAVENOUS
  Administered 2014-04-17 (×2): 2 mg via INTRAVENOUS
  Administered 2014-04-17: 1 mg via INTRAVENOUS
  Administered 2014-04-17: 2 mg via INTRAVENOUS
  Administered 2014-04-17: 1 mg via INTRAVENOUS

## 2014-04-17 MED ORDER — FENTANYL CITRATE 0.05 MG/ML IJ SOLN
INTRAMUSCULAR | Status: AC
  Filled 2014-04-17: qty 2

## 2014-04-17 MED ORDER — DIPHENHYDRAMINE HCL 50 MG/ML IJ SOLN
INTRAMUSCULAR | Status: AC
  Filled 2014-04-17: qty 1

## 2014-04-17 MED ORDER — DIPHENHYDRAMINE HCL 50 MG/ML IJ SOLN
INTRAMUSCULAR | Status: DC | PRN
  Administered 2014-04-17: 25 mg via INTRAVENOUS

## 2014-04-17 MED ORDER — BUTAMBEN-TETRACAINE-BENZOCAINE 2-2-14 % EX AERO
INHALATION_SPRAY | CUTANEOUS | Status: DC | PRN
  Administered 2014-04-17: 2 via TOPICAL

## 2014-04-17 MED ORDER — FENTANYL CITRATE 0.05 MG/ML IJ SOLN
INTRAMUSCULAR | Status: DC | PRN
  Administered 2014-04-17 (×2): 12.5 ug via INTRAVENOUS
  Administered 2014-04-17 (×3): 25 ug via INTRAVENOUS

## 2014-04-17 MED ORDER — MIDAZOLAM HCL 10 MG/2ML IJ SOLN
INTRAMUSCULAR | Status: AC
  Filled 2014-04-17: qty 2

## 2014-04-17 MED ORDER — SODIUM CHLORIDE 0.9 % IV SOLN
INTRAVENOUS | Status: DC
  Administered 2014-04-17: 500 mL via INTRAVENOUS

## 2014-04-17 NOTE — Interval H&P Note (Signed)
History and Physical Interval Note:  04/17/2014 9:08 AM  Tracy Mclaughlin  has presented today for surgery, with the diagnosis of heme pos stool iron def anemia personal hx of colon cancer  The various methods of treatment have been discussed with the patient and family. After consideration of risks, benefits and other options for treatment, the patient has consented to  Procedure(s): ESOPHAGOGASTRODUODENOSCOPY (EGD) (N/A) COLONOSCOPY (N/A) as a surgical intervention .  The patient's history has been reviewed, patient examined, no change in status, stable for surgery.  I have reviewed the patient's chart and labs.  Questions were answered to the patient's satisfaction.     Gatha Mayer

## 2014-04-17 NOTE — Op Note (Signed)
[facility] [place], [address]   ENDOSCOPY PROCEDURE REPORT  PATIENT: Glema, Takaki  MR#: [id number] BIRTHDATE: [birth year] , 89  yrs. old GENDER: Female ENDOSCOPIST: Gatha Mayer, MD, [facility] Regal REFERRED BY:  Arturo Morton, M.D. PROCEDURE DATE:  04/17/2014 PROCEDURE:  EGD, diagnostic ASA CLASS:     Class II INDICATIONS:  Iron deficiency anemia.   Heme positive stool. MEDICATIONS: Fentanyl 75 mcg IV, Versed 6 mg IV, and Benadryl 25 mg IV TOPICAL ANESTHETIC: Cetacaine Spray  DESCRIPTION OF PROCEDURE: After the risks benefits and alternatives of the procedure were thoroughly explained, informed consent was obtained.  The Pentax Gastroscope [id number] endoscope was introduced through the mouth and advanced to the second portion of the duodenum. Without limitations.  The instrument was slowly withdrawn as the mucosa was fully examined.      The upper, middle and distal third of the esophagus were carefully inspected and no abnormalities were noted.  The z-line was well seen at the GEJ.  The endoscope was pushed into the fundus which was normal including a retroflexed view.  The antrum, gastric body, first and second part of the duodenum were unremarkable. Retroflexed views revealed no abnormalities.     The scope was then withdrawn from the patient and the procedure completed.  COMPLICATIONS: There were no complications. ENDOSCOPIC IMPRESSION: Normal EGD  RECOMMENDATIONS: Proceed with a Colonoscopy.    eSigned:  Gatha Mayer, MD, [facility] 04/17/2014 11:04 AM   QZ:[id number] Benay Spice, MD, Madaline Brilliant, MD and The Patient

## 2014-04-17 NOTE — Discharge Instructions (Signed)
I found and removed one colon polyp. You also have diverticulosis in the colon - not a problem.  The upper endoscopy was normal.  Not sure where the blood/iron loss is coming from based upon these findings though the polyp could leak some blood unlikely it caused an anemia.  Need to consider doing a small bowel capsule endoscopy - will discuss with you when we call about colon polyp pathology - it looks benign.  I appreciate the opportunity to care for you. Gatha Mayer, MD, FACG  YOU HAD AN ENDOSCOPIC PROCEDURE TODAY: Refer to the procedure report and other information in the discharge instructions given to you for any specific questions about what was found during the examination. If this information does not answer your questions, please call Dr. Celesta Aver office at [phone] to clarify.   YOU SHOULD EXPECT: Some feelings of bloating in the abdomen. Passage of more gas than usual. Walking can help get rid of the air that was put into your GI tract during the procedure and reduce the bloating. If you had a lower endoscopy (such as a colonoscopy or flexible sigmoidoscopy) you may notice spotting of blood in your stool or on the toilet paper. Some abdominal soreness may be present for a day or two, also.  DIET: Your first meal following the procedure should be a light meal and then it is ok to progress to your normal diet. A half-sandwich or bowl of soup is an example of a good first meal. Heavy or fried foods are harder to digest and may make you feel nauseous or bloated. Drink plenty of fluids but you should avoid alcoholic beverages for 24 hours.   ACTIVITY: Your care partner should take you home directly after the procedure. You should plan to take it easy, moving slowly for the rest of the day. You can resume normal activity the day after the procedure however YOU SHOULD NOT DRIVE, use power tools, machinery or perform tasks that involve climbing or major physical exertion for 24 hours  (because of the sedation medicines used during the test).   SYMPTOMS TO REPORT IMMEDIATELY: A gastroenterologist can be reached at any hour. Please call [phone]  for any of the following symptoms:  Following lower endoscopy (colonoscopy, flexible sigmoidoscopy) Excessive amounts of blood in the stool  Significant tenderness, worsening of abdominal pains  Swelling of the abdomen that is new, acute  Fever of 100 or higher  Following upper endoscopy (EGD, EUS, ERCP, esophageal dilation) Vomiting of blood or coffee ground material  New, significant abdominal pain  New, significant chest pain or pain under the shoulder blades  Painful or persistently difficult swallowing  New shortness of breath  Black, tarry-looking or red, bloody stools  FOLLOW UP:  If any biopsies were taken you will be contacted by phone or by letter within the next 1-3 weeks. Call [phone]  if you have not heard about the biopsies in 3 weeks.  Please also call with any specific questions about appointments or follow up tests.

## 2014-04-17 NOTE — H&P (View-Only) (Signed)
Subjective:    Patient ID: Tracy Mclaughlin, female    DOB: 1934-11-29, 80 y.o.   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 Lab Results  Component Value Date   FERRITIN 6* 03/18/2014   She is followed by Dr. Benay Spice for history of colon (cecal)cancer resected in 2012 [facility]), thyroid cancer and B-cell CLL. She has a hx of Afib and DVT and takes Xarelto. She has not noticed any bleeding, change in bowels or abdominal pain but does have heme + stools Some fatigue noted. Improved after Iron Tx. GI ROs o/w negative  No Known Allergies Outpatient Prescriptions Prior to Visit  Medication Sig Dispense Refill  . citalopram (CELEXA) 10 MG tablet Take 40 mg by mouth daily.       Marland Kitchen diltiazem (DILACOR XR) 120 MG 24 hr capsule Daily.      . diphenhydrAMINE (BENADRYL) 25 MG tablet Take 25 mg by mouth at bedtime as needed. For itching/sleep      . ferrous sulfate 325 (65 FE) MG EC tablet Take 1 tablet (325 mg total) by mouth 2 (two) times daily with a meal.  60 tablet  3  . Rivaroxaban (XARELTO) 20 MG TABS Take 1 tablet by mouth daily.      Marland Kitchen levothyroxine (SYNTHROID, LEVOTHROID) 75 MCG tablet Take 250 mcg by mouth daily. Takes 250 mcg per day       No facility-administered medications prior to visit.   Past Medical History  Diagnosis Date  . Hypertension   . Arthritis   . Subarachnoid hemorrhage 1996  . Diverticulosis   . Non Hodgkin's lymphoma     Non-Hodgkin's B-Cell Lymphoma  . Cancer     Thyroid/Colon/ B cell Lymphoma-remission  . History of atrial fibrillation     Was in only in a- fib for 2 days  . Pulmonary embolus 11/2011  . Thyroid disease   . Paroxysmal atrial fibrillation   . Papillary thyroid carcinoma   . Post-surgical hypothyroidism   . Atrial fibrillation   . Anemia    Past Surgical History  Procedure Laterality Date  . Knee arthroscopy Left 1998    S/P  Arthroscopic Left knee  surgery  . Tuboplasty / tubotubal anastomosis    . Subdural hematoma evacuation via craniotomy    . Rotator cuff repair Right 1998  . Colon resection  11/12    mucinous adenocarcinoma of the cecum, resected  . Thyroidectomy  05/23/2012    Procedure: THYROIDECTOMY;  Surgeon: [name], MD;  Location: [place];  Service: General;  Laterality: N/A;  Total thyroidectomy, central compartment lymph node biopsy times two, reimplantation left superior parathyroid gland.  Marland Kitchen Appendectomy  1959  . Cholecystectomy  1999    S/P Lap Cholecystectomy  . Colonoscopy  10/05/2011   History   Social History  . Marital Status: Married    Spouse Name: N/A    Number of Children: 0  . Years of Education: N/A   Occupational History  . retired    Social History Main Topics  . Smoking status: Former Smoker -- 1.00 packs/day for 12 years    Types: Cigarettes    Quit date: 08/23/1969  . Smokeless tobacco: Never Used  . Alcohol Use: Yes     Comment: 1 manhattens per night  . Drug Use: No  . Sexual Activity: None   Other Topics Concern  . None  Social History Narrative  . None   Family History  Problem Relation Age of Onset  . Kidney cancer Sister     ?  Marland Kitchen Hypertension Sister   . Lung cancer Brother   . Anesthesia problems Neg Hx   . Hypertension Mother   . Diabetes Mother   . Coronary artery disease Mother   . Diabetes Sister     x 2         Review of Systems As mentioned above, all other ROS negative    Objective:   Physical Exam General:  Well-developed, well-nourished and in no acute distress Eyes:  anicteric. ENT:   Mouth and posterior pharynx free of lesions except + torus palatinis and partial dentures Neck:   supple w/o thyromegaly or mass.  Lungs: Clear to auscultation bilaterally. Heart:  S1S2, no rubs, murmurs, gallops. Abdomen: Mildly obese soft, non-tender, no hepatosplenomegaly, hernia, or mass and BS+.  Rectal: deferred Lymph:  no cervical or supraclavicular  adenopathy. Extremities:   no edema Skin   no rash. Neuro:  A&O x 3.  Psych:  appropriate mood and  Affect.   Data Reviewed: Labs, oncology, cardiology notes, CT scans in EMR Op notes and colonoscopy notes from [facility] 2012    Assessment & Plan:  Heme + stool - Plan: Ambulatory referral to Gastroenterology, 0.9 %  sodium chloride infusion, Ambulatory referral to Gastroenterology  Anemia, iron deficiency - Plan: Ambulatory referral to Gastroenterology, 0.9 %  sodium chloride infusion, Ambulatory referral to Gastroenterology  Personal history of colon cancer - Plan: Ambulatory referral to Gastroenterology, 0.9 %  sodium chloride infusion, Ambulatory referral to Gastroenterology  Long term (current) use of anticoagulants-xarelto - afib and hx PE  Needs an EGD and colonoscopy to sort out cause of anemia. If unrevealing need to consider capsule endoscopy of small bowel. Needs to hold Xarelto 1-2 days before the colonoscopy - will see if ok by checking with Dr. Tamala Julian (cardiology). Will do at [facility] and have APC available in case she has AVM's  The risks and benefits as well as alternatives of endoscopic procedure(s) have been discussed and reviewed. All questions answered. The patient agrees to proceed. Extra risk of stroke or thrombosis off Xarelto reviewed.  NL:[id number], MD Julieanne Manson, MD

## 2014-04-17 NOTE — Op Note (Signed)
[facility] [place], [address]   COLONOSCOPY PROCEDURE REPORT  PATIENT: Tracy Mclaughlin, Dial  MR#: [id number] BIRTHDATE: 1934-03-17 , 30  yrs. old GENDER: Female ENDOSCOPIST: Gatha Mayer, MD, [facility] REFERRED GE:ZMOQ Edrick Kins, M.D. PROCEDURE DATE:  04/17/2014 PROCEDURE:   Colonoscopy with snare polypectomy First Screening Colonoscopy - Avg.  risk and is 50 yrs.  old or older - No.  Prior Negative Screening - Now for repeat screening. N/A  History of Adenoma - Now for follow-up colonoscopy & has been > or = to 3 yrs.  Yes hx of adenoma.  Has been 3 or more years since last colonoscopy.  Polyps Removed Today? Yes. ASA CLASS:   Class II INDICATIONS:Iron Deficiency Anemia, heme-positive stool, and High risk patient with personal history of colon cancer. MEDICATIONS: There was residual sedation effect present from prior procedure, Versed 3 mg IV, and Fentanyl 25 mcg IV  DESCRIPTION OF PROCEDURE:   After the risks benefits and alternatives of the procedure were thoroughly explained, informed consent was obtained.  A digital rectal exam revealed no abnormalities of the rectum.   The Pentax Colonoscope [id number] endoscope was introduced through the anus and advanced to the surgical anastomosis. No adverse events experienced.   The quality of the prep was Suprep good  The instrument was then slowly withdrawn as the colon was fully examined.      COLON FINDINGS: A sessile polyp measuring 5 mm in size was found in the sigmoid colon.  A polypectomy was performed with a cold snare. The resection was complete and the polyp tissue was completely retrieved.   Severe diverticulosis was noted in the sigmoid colon. There was evidence of a prior ileocolonic surgical anastomosis The finding was in the right colon.   The colon mucosa was otherwise normal.  Retroflexed views revealed no abnormalities. The time to cecum=4 minutes 0 seconds.  Withdrawal time=8 minutes 0  seconds. The scope was withdrawn and the procedure completed. COMPLICATIONS: There were no complications.  ENDOSCOPIC IMPRESSION: 1.   Sessile polyp measuring 5 mm in size was found in the sigmoid colon; polypectomy was performed with a cold snare 2.   Severe diverticulosis was noted in the sigmoid colon 3.   There was evidence of a prior ileocolonic surgical anastomosis in the right colon (hx colon cancer) 4.   The colon mucosa was otherwise normal - good prep  RECOMMENDATIONS: 1.  Office will call with the results. 2.  resume all meds including Xarelto Will discuss capsule endoscopy with her when call about polyp pathology   eSigned:  Gatha Mayer, MD, [facility] 04/17/2014 11:01 AM   cc: Kavin Leech, MD    Madaline Brilliant, MD and the Patient   PATIENT NAME:  [name], [name] MR#: [id number]

## 2014-04-18 ENCOUNTER — Encounter (HOSPITAL_COMMUNITY): Payer: Self-pay | Admitting: Internal Medicine

## 2014-04-19 ENCOUNTER — Other Ambulatory Visit: Payer: Self-pay

## 2014-04-19 ENCOUNTER — Encounter: Payer: Self-pay | Admitting: Internal Medicine

## 2014-04-19 NOTE — Progress Notes (Signed)
Quick Note:  Please tell her small polyp was a benign adenoma Will consider repeating a colonoscopy in 5 years (would be 53) - can place a recall  I recommend she take iron and see how it goes and if still having difficulty then could do a capsule endoscopy - she can let me know if ?  Please copy (mail or fax) her PCP - name is on report and my note   ______

## 2014-05-10 ENCOUNTER — Ambulatory Visit: Payer: Medicare Other | Admitting: Internal Medicine

## 2014-05-22 ENCOUNTER — Other Ambulatory Visit (HOSPITAL_COMMUNITY): Payer: Self-pay | Admitting: Internal Medicine

## 2014-05-22 DIAGNOSIS — C8589 Other specified types of non-Hodgkin lymphoma, extranodal and solid organ sites: Secondary | ICD-10-CM

## 2014-05-28 ENCOUNTER — Other Ambulatory Visit: Payer: Self-pay | Admitting: Radiology

## 2014-05-29 ENCOUNTER — Other Ambulatory Visit (HOSPITAL_COMMUNITY): Payer: Self-pay | Admitting: Internal Medicine

## 2014-05-29 ENCOUNTER — Other Ambulatory Visit (HOSPITAL_BASED_OUTPATIENT_CLINIC_OR_DEPARTMENT_OTHER): Payer: Medicare Other

## 2014-05-29 ENCOUNTER — Ambulatory Visit (HOSPITAL_COMMUNITY)
Admission: RE | Admit: 2014-05-29 | Discharge: 2014-05-29 | Disposition: A | Discharge status: Home / Self Care | Payer: Medicare Other | Source: Ambulatory Visit | Attending: Internal Medicine | Admitting: Internal Medicine

## 2014-05-29 DIAGNOSIS — C189 Malignant neoplasm of colon, unspecified: Secondary | ICD-10-CM

## 2014-05-29 DIAGNOSIS — C73 Malignant neoplasm of thyroid gland: Secondary | ICD-10-CM | POA: Insufficient documentation

## 2014-05-29 DIAGNOSIS — C8590 Non-Hodgkin lymphoma, unspecified, unspecified site: Secondary | ICD-10-CM

## 2014-05-29 DIAGNOSIS — C8589 Other specified types of non-Hodgkin lymphoma, extranodal and solid organ sites: Secondary | ICD-10-CM | POA: Insufficient documentation

## 2014-05-29 DIAGNOSIS — C8307 Small cell B-cell lymphoma, spleen: Secondary | ICD-10-CM

## 2014-05-29 DIAGNOSIS — Z85038 Personal history of other malignant neoplasm of large intestine: Secondary | ICD-10-CM | POA: Insufficient documentation

## 2014-05-29 LAB — CBC WITH DIFFERENTIAL/PLATELET
BASO%: 1.3 % (ref 0.0–2.0)
Basophils Absolute: 0.1 10*3/uL (ref 0.0–0.1)
EOS%: 2.6 % (ref 0.0–7.0)
Eosinophils Absolute: 0.2 10*3/uL (ref 0.0–0.5)
HCT: 40.3 % (ref 34.8–46.6)
HGB: 13.3 g/dL (ref 11.6–15.9)
LYMPH#: 2 10*3/uL (ref 0.9–3.3)
LYMPH%: 34 % (ref 14.0–49.7)
MCH: 31.4 pg (ref 25.1–34.0)
MCHC: 33.1 g/dL (ref 31.5–36.0)
MCV: 95 fL (ref 79.5–101.0)
MONO#: 0.7 10*3/uL (ref 0.1–0.9)
MONO%: 11.2 % (ref 0.0–14.0)
NEUT#: 3 10*3/uL (ref 1.5–6.5)
NEUT%: 50.9 % (ref 38.4–76.8)
Platelets: 178 10*3/uL (ref 145–400)
RBC: 4.24 10*6/uL (ref 3.70–5.45)
RDW: 23.1 % — AB (ref 11.2–14.5)
WBC: 5.9 10*3/uL (ref 3.9–10.3)

## 2014-05-29 NOTE — Procedures (Signed)
Interventional Radiology Procedure Note  Procedure: [place] FNA bx of left cervical LN Complications: None Recommendations:' - Path pending  Signed,  Criselda Peaches, MD [facility]

## 2014-05-31 ENCOUNTER — Telehealth: Payer: Self-pay | Admitting: *Deleted

## 2014-05-31 NOTE — Telephone Encounter (Signed)
Notified of Hgb result and to continue iron. F/U as scheduled. She reports feeling better-no dyspnea and "I have as much energy as someone my age should have".

## 2014-05-31 NOTE — Telephone Encounter (Signed)
Message copied by Tania Ade on Fri May 31, 2014  5:27 PM ------      Message from: Betsy Coder B      Created: Thu May 30, 2014  9:50 PM       Please call patient hb is better,continue iron, f/u as scheduled ------

## 2014-06-03 ENCOUNTER — Other Ambulatory Visit: Payer: Medicare Other

## 2014-06-06 ENCOUNTER — Telehealth: Payer: Self-pay | Admitting: *Deleted

## 2014-06-06 NOTE — Telephone Encounter (Signed)
Left VM with appointment with Dr. Benay Spice for 07/04/14 at 1130. Requested she call back to confirm.

## 2014-06-06 NOTE — Telephone Encounter (Signed)
VM from patient requesting appointment per instruction of Dr. Buddy Duty.  Schedule for 3-4 weeks

## 2014-07-04 ENCOUNTER — Ambulatory Visit (HOSPITAL_BASED_OUTPATIENT_CLINIC_OR_DEPARTMENT_OTHER): Payer: Medicare Other | Admitting: Oncology

## 2014-07-04 ENCOUNTER — Telehealth: Payer: Self-pay | Admitting: Oncology

## 2014-07-04 VITALS — BP 137/45 | HR 62 | Temp 98.5°F | Resp 19 | Ht 67.5 in | Wt 224.4 lb

## 2014-07-04 DIAGNOSIS — D509 Iron deficiency anemia, unspecified: Secondary | ICD-10-CM

## 2014-07-04 DIAGNOSIS — C73 Malignant neoplasm of thyroid gland: Secondary | ICD-10-CM

## 2014-07-04 DIAGNOSIS — I4891 Unspecified atrial fibrillation: Secondary | ICD-10-CM

## 2014-07-04 DIAGNOSIS — C189 Malignant neoplasm of colon, unspecified: Secondary | ICD-10-CM

## 2014-07-04 DIAGNOSIS — C8590 Non-Hodgkin lymphoma, unspecified, unspecified site: Secondary | ICD-10-CM

## 2014-07-04 DIAGNOSIS — C8589 Other specified types of non-Hodgkin lymphoma, extranodal and solid organ sites: Secondary | ICD-10-CM

## 2014-07-04 DIAGNOSIS — Z86711 Personal history of pulmonary embolism: Secondary | ICD-10-CM

## 2014-07-04 DIAGNOSIS — R599 Enlarged lymph nodes, unspecified: Secondary | ICD-10-CM

## 2014-07-04 NOTE — Progress Notes (Signed)
[place] OFFICE PROGRESS NOTE   Diagnosis: History of non-Hodgkin's lymphoma, anemia  INTERVAL HISTORY:   Tracy Mclaughlin returns as scheduled. She reports feeling much better since beginning iron. The dyspnea is improved. She underwent an endoscopic evaluation by Dr. Carlean Purl for the iron deficiency. No source for bleeding was identified. A tubular adenoma was removed from the sigmoid colon. The stool is dark when she takes iron.  She was referred for a thyroid ultrasound 02/21/2014. A 1.2 cm lymph node was seen lateral to the thyroid bed. This was a new finding compared to a study from February of 2014. Dr. Buddy Duty referred her for an ultrasound-guided biopsy of the lymph node on 05/29/2014. The pathology ([id number]) revealed an atypical lymphoid population was noted. Metastatic carcinoma was not identified. There was insufficient material for flow cytometry. The changes were concerning for a lymphoproliferative disorder.  She denies fever, palpable lymph nodes, and a drenching night sweats. No anorexia.  Objective:  Vital signs in last 24 hours:  Blood pressure 137/45, pulse 62, temperature 98.5 F (36.9 C), temperature source Oral, resp. rate 19, height 5' 7.5" (1.715 m), weight 224 lb 6.4 oz (101.787 kg).    HEENT: Neck without mass, oropharynx without visible mass Lymphatics: No cervical, supraclavicular, axillary, or inguinal nodes Resp: Lungs clear bilaterally Cardio: Regular rate and rhythm GI: No hepatosplenomegaly, nontender, no Vascular: No leg edema   Lab Results:  Lab Results  Component Value Date   WBC 5.9 05/29/2014   HGB 13.3 05/29/2014   HCT 40.3 05/29/2014   MCV 95.0 05/29/2014   PLT 178 05/29/2014   NEUTROABS 3.0 05/29/2014    Medications: I have reviewed the patient's current medications.  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 Status post weekly Rituxan x4 03/01/2012 through 03/22/2012. She completed 4 "maintenance "doses of Rituxan, last on 12/19/2012. A restaging CT on 02/09/2013 showed no evidence of lymphoma.   Lymph node lateral to the thyroid bed on a neck ultrasound 02/21/2014, status post an FNA biopsy concerning for a lymphoproliferative disorder 2. Stage IV (T1bN1b M0) papillary thyroid cancer, status post a thyroidectomy with reimplantation of the left superior parathyroid gland on 05/23/2012, status post radioactive iodine therapy, followed by Dr. Buddy Duty  3. Stage II (T3 N0) colon cancer, status post a right colectomy 10/19/2011, last colonoscopy April 2015-sigmoid adenoma removed 4. History of a pulmonary embolism December 2012.  5. history of Atrial fibrillation-maintained on Xarelto.  6. iron deficiency Anemia-new 03/18/2014. Hemoccult positive stool. The anemia has corrected with iron.  Status post an upper endoscopy and colonoscopy by Dr. Carlean Purl April 2013 with no bleeding source identified, benign adenoma removed from the sigmoid colon   Disposition:  Tracy Mclaughlin remains in clinical remission from lymphoma and colon cancer. She has a history of thyroid cancer and is followed by Dr. Buddy Duty. A small left neck lymph node was noted on a thyroid ultrasound 02/21/2014 and a needle biopsy is concerning for a lymphoproliferative disorder. The left neck lymph node is most likely related to the low-grade lymphoma she had several years ago. I have a low clinical suspicion for a high-grade lymphoma.  We decided to observe her. If an excisional biopsy confirmed a diagnosis of low-grade lymphoma I would recommend observation.  She is comfortable with clinical followup.  She will continue iron. The plan is to schedule a camera endoscopy with Dr. Carlean Purl if she develops progressive anemia or bleeding.  Tracy Mclaughlin will return for an office visit and CBC in 3 months.  Betsy Coder, MD  07/04/2014  3:47  PM

## 2014-07-04 NOTE — Telephone Encounter (Signed)
gv adn rpinted appt sched and avs for pt for OCT...per pof cx Aug appt

## 2014-07-29 ENCOUNTER — Encounter: Payer: Self-pay | Admitting: Interventional Cardiology

## 2014-07-29 ENCOUNTER — Ambulatory Visit (INDEPENDENT_AMBULATORY_CARE_PROVIDER_SITE_OTHER): Payer: Medicare Other | Admitting: Interventional Cardiology

## 2014-07-29 VITALS — BP 131/59 | HR 55 | Ht 67.25 in | Wt 226.0 lb

## 2014-07-29 DIAGNOSIS — D509 Iron deficiency anemia, unspecified: Secondary | ICD-10-CM

## 2014-07-29 DIAGNOSIS — I4891 Unspecified atrial fibrillation: Secondary | ICD-10-CM

## 2014-07-29 DIAGNOSIS — I480 Paroxysmal atrial fibrillation: Secondary | ICD-10-CM

## 2014-07-29 DIAGNOSIS — I609 Nontraumatic subarachnoid hemorrhage, unspecified: Secondary | ICD-10-CM

## 2014-07-29 DIAGNOSIS — Z7901 Long term (current) use of anticoagulants: Secondary | ICD-10-CM

## 2014-07-29 NOTE — Patient Instructions (Signed)
Your physician recommends that you continue on your current medications as directed. Please refer to the Current Medication list given to you today.  Call the office if your are experiencing dark stool or blood in your stool.  Your physician wants you to follow-up in: 1 year with Dr.Michel You will receive a reminder letter in the mail two months in advance. If you don't receive a letter, please call our office to schedule the follow-up appointment.

## 2014-07-29 NOTE — Progress Notes (Signed)
Patient ID: Tracy Mclaughlin, female   DOB: Sep 28, 1934, 80 y.o.   MRN: [id number] Past Medical History  non-Hodgkin's lymphoma with elevated peripheral white blood cell count greater than 90,000   Hypertension   History of colon cancer status post resection   Pulmonary embolism, December 2012   history of PAT   Paroxysmal atrial fibrillation, 2/13   papillary thyroid carcinoma, T1b N1b Mx [stage IVA if no distant metastases]   post-surgical hypothyroidism [initial TSH goal <0.1]      [address], [address] [place], [place]  [address] Phone: [phone] Fax:  [phone]  Date:  07/29/2014   ID:  Tracy Mclaughlin, DOB 28-Jul-1934, MRN [id number]  PCP:  Madaline Brilliant, MD   ASSESSMENT:  1. Paroxysmal atrial fibrillation with one episode documented in 2013 2. Severe anemia related to GI bleeding, 2015, hemoglobin dropped from 14-8.5 3. Chronic anticoagulation,, Xarelto 4. Hypertension, controlled  PLAN:  1. With hemoglobin now back up to 13 and patient still on anticoagulation, I would not discontinue the therapy. If she redeveloped severe anemia, has melena, or bright red blood per time, we will discontinue anticoagulation 2. Hypertension, controlled   SUBJECTIVE: Tracy Mclaughlin is a 80 y.o. female who developed severe anemia with hemoglobin of 8.6 down from a baseline of 14 and earlier this year. She is now on iron and has a hemoglobin is up to 3.5. This is being followed by Dr. Blythe Stanford. She denies cardiopulmonary complaints. She has not had palpitations or fatigue similar to that that occurred with atrial fibrillation in 2013. That one episode of atrial fibrillation is the only one that we have clinically documented.   Wt Readings from Last 3 Encounters:  07/29/14 226 lb (102.513 kg)  07/04/14 224 lb 6.4 oz (101.787 kg)  04/02/14 221 lb 4.8 oz (100.381 kg)     Past Medical History  Diagnosis Date  . Hypertension   . Arthritis   . Subarachnoid hemorrhage 1996  .  Diverticulosis   . Non Hodgkin's lymphoma     Non-Hodgkin's B-Cell Lymphoma  . Cancer     Thyroid/Colon/ B cell Lymphoma-remission  . History of atrial fibrillation     Was in only in a- fib for 2 days  . Pulmonary embolus 11/2011  . Thyroid disease   . Paroxysmal atrial fibrillation   . Papillary thyroid carcinoma   . Post-surgical hypothyroidism   . Atrial fibrillation   . Anemia     Current Outpatient Prescriptions  Medication Sig Dispense Refill  . citalopram (CELEXA) 40 MG tablet Take 40 mg by mouth daily.      Marland Kitchen diltiazem (DILACOR XR) 120 MG 24 hr capsule Take 120 mg by mouth Daily.       . diphenhydrAMINE (BENADRYL) 25 MG tablet Take 25 mg by mouth at bedtime as needed. For itching/sleep      . ferrous sulfate 325 (65 FE) MG EC tablet Take 1 tablet (325 mg total) by mouth 2 (two) times daily with a meal.  60 tablet  3  . levothyroxine (SYNTHROID, LEVOTHROID) 125 MCG tablet Take 250 mcg by mouth daily before breakfast.      . losartan (COZAAR) 100 MG tablet Take 100 mg by mouth daily.      . Rivaroxaban (XARELTO) 20 MG TABS Take 1 tablet by mouth daily.       No current facility-administered medications for this visit.    Allergies:   No Known Allergies  Social History:  The patient  reports  that she quit smoking about 44 years ago. Her smoking use included Cigarettes. She has a 12 pack-year smoking history. She has never used smokeless tobacco. She reports that she drinks alcohol. She reports that she does not use illicit drugs.   ROS:  Please see the history of present illness.   No transient neurological complaints. No peripheral edema.   All other systems reviewed and negative.   OBJECTIVE: VS:  BP 131/59  Pulse 55  Ht 5' 7.25" (1.708 m)  Wt 226 lb (102.513 kg)  BMI 35.14 kg/m2 Well nourished, well developed, in no acute distress, elderly HEENT: normal Neck: JVD flat. Carotid bruit absent  Cardiac:  normal S1, S2; RRR; no murmur Lungs:  clear to auscultation  bilaterally, no wheezing, rhonchi or rales Abd: soft, nontender, no hepatomegaly Ext: Edema absent. Pulses 2+ Skin: warm and dry Neuro:  CNs 2-12 intact, no focal abnormalities noted  EKG:  Sinus bradycardia at 59 beats per minute    Signed, Illene Labrador III, MD 07/29/2014 10:39 AM

## 2014-08-09 ENCOUNTER — Ambulatory Visit: Payer: Medicare Other | Admitting: Oncology

## 2014-08-09 ENCOUNTER — Other Ambulatory Visit: Payer: Medicare Other

## 2014-09-26 ENCOUNTER — Ambulatory Visit (HOSPITAL_BASED_OUTPATIENT_CLINIC_OR_DEPARTMENT_OTHER): Payer: Medicare Other | Admitting: Nurse Practitioner

## 2014-09-26 ENCOUNTER — Other Ambulatory Visit (HOSPITAL_BASED_OUTPATIENT_CLINIC_OR_DEPARTMENT_OTHER): Payer: Medicare Other

## 2014-09-26 ENCOUNTER — Telehealth: Payer: Self-pay | Admitting: Oncology

## 2014-09-26 VITALS — BP 153/55 | HR 63 | Temp 98.3°F | Resp 19 | Ht 67.0 in | Wt 225.7 lb

## 2014-09-26 DIAGNOSIS — C73 Malignant neoplasm of thyroid gland: Secondary | ICD-10-CM

## 2014-09-26 DIAGNOSIS — C189 Malignant neoplasm of colon, unspecified: Secondary | ICD-10-CM

## 2014-09-26 DIAGNOSIS — C8590 Non-Hodgkin lymphoma, unspecified, unspecified site: Secondary | ICD-10-CM

## 2014-09-26 DIAGNOSIS — Z85038 Personal history of other malignant neoplasm of large intestine: Secondary | ICD-10-CM

## 2014-09-26 DIAGNOSIS — D509 Iron deficiency anemia, unspecified: Secondary | ICD-10-CM

## 2014-09-26 LAB — CBC WITH DIFFERENTIAL/PLATELET
BASO%: 0.9 % (ref 0.0–2.0)
Basophils Absolute: 0.1 10*3/uL (ref 0.0–0.1)
EOS%: 3.1 % (ref 0.0–7.0)
Eosinophils Absolute: 0.2 10*3/uL (ref 0.0–0.5)
HEMATOCRIT: 40.7 % (ref 34.8–46.6)
HGB: 13.5 g/dL (ref 11.6–15.9)
LYMPH%: 32.7 % (ref 14.0–49.7)
MCH: 33.4 pg (ref 25.1–34.0)
MCHC: 33.1 g/dL (ref 31.5–36.0)
MCV: 100.7 fL (ref 79.5–101.0)
MONO#: 0.6 10*3/uL (ref 0.1–0.9)
MONO%: 9.9 % (ref 0.0–14.0)
NEUT#: 3.5 10*3/uL (ref 1.5–6.5)
NEUT%: 53.4 % (ref 38.4–76.8)
Platelets: 167 10*3/uL (ref 145–400)
RBC: 4.04 10*6/uL (ref 3.70–5.45)
RDW: 13 % (ref 11.2–14.5)
WBC: 6.5 10*3/uL (ref 3.9–10.3)
lymph#: 2.1 10*3/uL (ref 0.9–3.3)

## 2014-09-26 NOTE — Progress Notes (Signed)
  [place] OFFICE PROGRESS NOTE   Diagnosis:  History of non-Hodgkin's lymphoma, colon cancer, iron deficiency anemia  INTERVAL HISTORY:   [name] returns as scheduled. She feels well. No interim illnesses or infections. No fevers or sweats. She has a good appetite. No weight loss. She denies pain. No change in bowel habits. Stools are dark. She relates this to oral iron. She reports receiving the influenza vaccine as well as Prevnar 13.  Objective:  Vital signs in last 24 hours:  Blood pressure 153/55, pulse 63, temperature 98.3 F (36.8 C), temperature source Oral, resp. rate 19, height 5\' 7"  (1.702 m), weight 225 lb 11.2 oz (102.377 kg), SpO2 98.00%.    HEENT: No thrush or ulcers. Lymphatics: No palpable cervical, supraclavicular, axillary or inguinal lymph nodes. Resp: Lungs clear bilaterally. Cardio: Regular rate and rhythm. GI: Abdomen soft and nontender. No organomegaly. Vascular: No leg edema.   Lab Results:  Lab Results  Component Value Date   WBC 6.5 09/26/2014   HGB 13.5 09/26/2014   HCT 40.7 09/26/2014   MCV 100.7 09/26/2014   PLT 167 09/26/2014   NEUTROABS 3.5 09/26/2014    Imaging:  No results found.  Medications: I have reviewed the patient's current medications.  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 Status post weekly Rituxan x4 03/01/2012 through 03/22/2012. She completed 4 "maintenance "doses of Rituxan, last on 12/19/2012. A restaging CT on 02/09/2013 showed no evidence of lymphoma.  Lymph node lateral to the thyroid bed on a neck ultrasound 02/21/2014, status post an FNA biopsy concerning for a lymphoproliferative disorder. 2. Stage IV (T1bN1b M0) papillary thyroid cancer, status post a thyroidectomy with reimplantation of the left superior parathyroid gland on 05/23/2012, status post radioactive iodine  therapy, followed by Dr. Buddy Duty.  3. Stage II (T3 N0) colon cancer, status post a right colectomy 10/19/2011, last colonoscopy April 2015-sigmoid adenoma removed.  4. History of a pulmonary embolism December 2012.  5. History of Atrial fibrillation-maintained on Xarelto.  6. Iron deficiency anemia-new 03/18/2014. Hemoccult positive stool. The anemia corrected with iron.  Status post an upper endoscopy and colonoscopy by Dr. Carlean Purl April 2015 with no bleeding source identified, benign adenoma removed from the sigmoid colon.   Disposition: [name] appears stable. She remains in clinical remission from lymphoma and colon cancer. She continues followup with Dr. Buddy Duty for the history of thyroid cancer.  Hemoglobin remains in normal range. She would like to discontinue oral iron and follow the hemoglobin. She is agreeable to return in approximately 6 weeks for a followup CBC and will contact the office if she develops signs/symptoms suggestive of progressive anemia.  She will return for a followup visit in 3 months. She will contact the office in the interim as outlined above or with any other problems.  Plan reviewed with Dr. Benay Spice.    Ned Card ANP/GNP-BC   09/26/2014  11:59 AM

## 2014-09-26 NOTE — Telephone Encounter (Signed)
gv and printeda ppt sched and avs fo rpt for Nov Jan 2016

## 2014-09-27 LAB — CEA: CEA: 2.8 ng/mL (ref 0.0–5.0)

## 2014-09-30 ENCOUNTER — Telehealth: Payer: Self-pay | Admitting: *Deleted

## 2014-09-30 NOTE — Telephone Encounter (Signed)
Called and informed patient of normal CEA.  Per Ned Card, NP.  Patient verbalized understanding.

## 2014-09-30 NOTE — Telephone Encounter (Signed)
Message copied by Wardell Heath on Mon Sep 30, 2014 11:54 AM ------      Message from: Owens Shark      Created: Mon Sep 30, 2014 11:47 AM       Please let her know CEA is in normal range. ------

## 2014-11-18 ENCOUNTER — Telehealth: Payer: Self-pay | Admitting: *Deleted

## 2014-11-18 ENCOUNTER — Other Ambulatory Visit (HOSPITAL_BASED_OUTPATIENT_CLINIC_OR_DEPARTMENT_OTHER): Payer: Medicare Other

## 2014-11-18 DIAGNOSIS — C189 Malignant neoplasm of colon, unspecified: Secondary | ICD-10-CM

## 2014-11-18 DIAGNOSIS — D509 Iron deficiency anemia, unspecified: Secondary | ICD-10-CM

## 2014-11-18 DIAGNOSIS — C73 Malignant neoplasm of thyroid gland: Secondary | ICD-10-CM

## 2014-11-18 DIAGNOSIS — C8590 Non-Hodgkin lymphoma, unspecified, unspecified site: Secondary | ICD-10-CM

## 2014-11-18 LAB — CBC WITH DIFFERENTIAL/PLATELET
BASO%: 0.7 % (ref 0.0–2.0)
BASOS ABS: 0.1 10*3/uL (ref 0.0–0.1)
EOS ABS: 0.3 10*3/uL (ref 0.0–0.5)
EOS%: 4 % (ref 0.0–7.0)
HCT: 40.4 % (ref 34.8–46.6)
HEMOGLOBIN: 13.4 g/dL (ref 11.6–15.9)
LYMPH#: 2.4 10*3/uL (ref 0.9–3.3)
LYMPH%: 34.1 % (ref 14.0–49.7)
MCH: 32.1 pg (ref 25.1–34.0)
MCHC: 33.2 g/dL (ref 31.5–36.0)
MCV: 96.9 fL (ref 79.5–101.0)
MONO#: 0.8 10*3/uL (ref 0.1–0.9)
MONO%: 11.5 % (ref 0.0–14.0)
NEUT#: 3.5 10*3/uL (ref 1.5–6.5)
NEUT%: 49.7 % (ref 38.4–76.8)
Platelets: 175 10*3/uL (ref 145–400)
RBC: 4.17 10*6/uL (ref 3.70–5.45)
RDW: 12.1 % (ref 11.2–14.5)
WBC: 7 10*3/uL (ref 3.9–10.3)

## 2014-11-18 NOTE — Telephone Encounter (Signed)
-----   Message from Owens Shark, NP sent at 11/18/2014  1:48 PM EST ----- Please let her know hemoglobin remains stable in the normal range. Okay to remain off of oral iron. Followup as scheduled.

## 2014-11-18 NOTE — Telephone Encounter (Signed)
Informed patient that hemoglobin remains stable in the normal range and it is ok to remain off of oral iron.  Per Elby Showers. Marcello Moores, NP.  Patient verbalized understanding.

## 2014-11-18 NOTE — Telephone Encounter (Signed)
Left message for patient to call back regarding lab results.

## 2014-12-16 ENCOUNTER — Telehealth: Payer: Self-pay | Admitting: Interventional Cardiology

## 2014-12-16 DIAGNOSIS — I480 Paroxysmal atrial fibrillation: Secondary | ICD-10-CM

## 2014-12-16 NOTE — Telephone Encounter (Signed)
New message    Patient calling C/O Afib last night. Should medication be increase.

## 2014-12-16 NOTE — Telephone Encounter (Signed)
Returned pt call. Pt husband sts that the pt is out grocery shopping. He reports that pt had an episode of afib last night. Her heatrate went up to 180bpm. Pt took and additional Diltiazem 120mg .and has since converted to NSR. adv him that Dr.Tess is currently out of the office.pt is to call back if further assistance is needed.I will route a message to Dr.Angelo with an update. He verbalized understanding.

## 2014-12-20 NOTE — Telephone Encounter (Signed)
If episodes continue to recur we will need to switch meds. For time being, need to increase diltiazem sr to 180 mg daily

## 2014-12-24 MED ORDER — DILTIAZEM HCL ER 180 MG PO CP24: 180.0000 mg | ORAL_CAPSULE | Freq: Every day | ORAL | Status: DC

## 2014-12-24 NOTE — Telephone Encounter (Signed)
   Pt aware of Dr.Essman's recommendation If episodes continue to recur we will need to switch meds. For time being, need to increase diltiazem sr to 180 mg daily.Rx sent to pt pharmacy. She agreed with plan and verbalized understanding

## 2014-12-27 ENCOUNTER — Ambulatory Visit (HOSPITAL_BASED_OUTPATIENT_CLINIC_OR_DEPARTMENT_OTHER): Payer: Medicare Other | Admitting: Oncology

## 2014-12-27 ENCOUNTER — Other Ambulatory Visit (HOSPITAL_BASED_OUTPATIENT_CLINIC_OR_DEPARTMENT_OTHER): Payer: Medicare Other

## 2014-12-27 ENCOUNTER — Telehealth: Payer: Self-pay | Admitting: Oncology

## 2014-12-27 VITALS — BP 119/65 | HR 64 | Temp 98.0°F | Resp 18 | Ht 67.0 in | Wt 226.2 lb

## 2014-12-27 DIAGNOSIS — Z85038 Personal history of other malignant neoplasm of large intestine: Secondary | ICD-10-CM

## 2014-12-27 DIAGNOSIS — D509 Iron deficiency anemia, unspecified: Secondary | ICD-10-CM

## 2014-12-27 DIAGNOSIS — C189 Malignant neoplasm of colon, unspecified: Secondary | ICD-10-CM

## 2014-12-27 DIAGNOSIS — C8590 Non-Hodgkin lymphoma, unspecified, unspecified site: Secondary | ICD-10-CM

## 2014-12-27 DIAGNOSIS — C73 Malignant neoplasm of thyroid gland: Secondary | ICD-10-CM

## 2014-12-27 LAB — CBC WITH DIFFERENTIAL/PLATELET
BASO%: 0.7 % (ref 0.0–2.0)
Basophils Absolute: 0.1 10*3/uL (ref 0.0–0.1)
EOS ABS: 0.2 10*3/uL (ref 0.0–0.5)
EOS%: 3.1 % (ref 0.0–7.0)
HEMATOCRIT: 39.4 % (ref 34.8–46.6)
HGB: 12.8 g/dL (ref 11.6–15.9)
LYMPH%: 35.1 % (ref 14.0–49.7)
MCH: 31.1 pg (ref 25.1–34.0)
MCHC: 32.5 g/dL (ref 31.5–36.0)
MCV: 95.6 fL (ref 79.5–101.0)
MONO#: 0.8 10*3/uL (ref 0.1–0.9)
MONO%: 10.4 % (ref 0.0–14.0)
NEUT%: 50.7 % (ref 38.4–76.8)
NEUTROS ABS: 3.8 10*3/uL (ref 1.5–6.5)
Platelets: 174 10*3/uL (ref 145–400)
RBC: 4.12 10*6/uL (ref 3.70–5.45)
RDW: 12.8 % (ref 11.2–14.5)
WBC: 7.4 10*3/uL (ref 3.9–10.3)
lymph#: 2.6 10*3/uL (ref 0.9–3.3)

## 2014-12-27 LAB — TECHNOLOGIST REVIEW

## 2014-12-27 NOTE — Progress Notes (Signed)
  [place] OFFICE PROGRESS NOTE   Diagnosis: Iron deficiency anemia, colon cancer  INTERVAL HISTORY:   Tracy Mclaughlin returns as scheduled. She feels well. No bleeding. She plans to have right knee replacement surgery in the near future. She is not taking iron. She reports a transient left lower neck fullness recently that has resolved.  Objective:  Vital signs in last 24 hours:  Blood pressure 119/65, pulse 64, temperature 98 F (36.7 C), temperature source Oral, resp. rate 18, height 5\' 7"  (1.702 m), weight 226 lb 3.2 oz (102.604 kg), SpO2 99 %.    HEENT: Neck without mass. Lymphatics: No cervical, supravesicular, axillary, or inguinal nodes Resp: Lungs clear bilaterally Cardio: Regular rate and rhythm GI: No hepatomegaly, nontender, no masses Vascular: No leg edema     Lab Results:  Lab Results  Component Value Date   WBC 7.4 12/27/2014   HGB 12.8 12/27/2014   HCT 39.4 12/27/2014   MCV 95.6 12/27/2014   PLT 174 12/27/2014   NEUTROABS 3.8 12/27/2014     Lab Results  Component Value Date   CEA 2.8 09/26/2014    Medications: I have reviewed the patient's current medications.  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 Status post weekly Rituxan x4 03/01/2012 through 03/22/2012. She completed 4 "maintenance "doses of Rituxan, last on 12/19/2012. A restaging CT on 02/09/2013 showed no evidence of lymphoma.   Lymph node lateral to the thyroid bed on a neck ultrasound 02/21/2014, status post an FNA biopsy concerning for a lymphoproliferative disorder. 2. Stage IV (T1bN1b M0) papillary thyroid cancer, status post a thyroidectomy with reimplantation of the left superior parathyroid gland on 05/23/2012, status post radioactive iodine therapy, followed by Dr. Buddy Duty.  3. Stage II (T3 N0) colon cancer, status post a right colectomy  10/19/2011, last colonoscopy April 2015-sigmoid adenoma removed.  4. History of a pulmonary embolism December 2012.  5. History of Atrial fibrillation-maintained on Xarelto.  6. Iron deficiency anemia-new 03/18/2014. Hemoccult positive stool. The anemia corrected with iron.   Status post an upper endoscopy and colonoscopy by Dr. Carlean Purl April 2015 with no bleeding source identified, benign adenoma removed from the sigmoid colon.    Disposition:  The hemoglobin remains normal while off of iron since October 2015. She will return for an office visit and CBC in 6 months. No clinical evidence of a progressive lymphoproliferative disorder. She remains in remission from colon cancer.  Tracy Coder, MD  12/27/2014  6:14 PM

## 2014-12-27 NOTE — Telephone Encounter (Signed)
Pt confirmed labs/ov per 01/08 POF, gave pt AVS.... KJ

## 2015-06-27 ENCOUNTER — Other Ambulatory Visit (HOSPITAL_BASED_OUTPATIENT_CLINIC_OR_DEPARTMENT_OTHER): Payer: Medicare Other

## 2015-06-27 ENCOUNTER — Telehealth: Payer: Self-pay | Admitting: Oncology

## 2015-06-27 ENCOUNTER — Ambulatory Visit (HOSPITAL_BASED_OUTPATIENT_CLINIC_OR_DEPARTMENT_OTHER): Payer: Medicare Other | Admitting: Oncology

## 2015-06-27 VITALS — BP 137/59 | HR 70 | Temp 98.1°F | Resp 18 | Ht 67.0 in | Wt 226.1 lb

## 2015-06-27 DIAGNOSIS — Z8572 Personal history of non-Hodgkin lymphomas: Secondary | ICD-10-CM | POA: Diagnosis not present

## 2015-06-27 DIAGNOSIS — D509 Iron deficiency anemia, unspecified: Secondary | ICD-10-CM

## 2015-06-27 DIAGNOSIS — Z85850 Personal history of malignant neoplasm of thyroid: Secondary | ICD-10-CM | POA: Diagnosis not present

## 2015-06-27 DIAGNOSIS — D72820 Lymphocytosis (symptomatic): Secondary | ICD-10-CM | POA: Diagnosis present

## 2015-06-27 DIAGNOSIS — Z85038 Personal history of other malignant neoplasm of large intestine: Secondary | ICD-10-CM | POA: Diagnosis not present

## 2015-06-27 DIAGNOSIS — C8590 Non-Hodgkin lymphoma, unspecified, unspecified site: Secondary | ICD-10-CM

## 2015-06-27 LAB — CBC WITH DIFFERENTIAL/PLATELET
BASO%: 0.5 % (ref 0.0–2.0)
Basophils Absolute: 0.1 10*3/uL (ref 0.0–0.1)
EOS%: 2.6 % (ref 0.0–7.0)
Eosinophils Absolute: 0.2 10*3/uL (ref 0.0–0.5)
HCT: 41.6 % (ref 34.8–46.6)
HGB: 13.6 g/dL (ref 11.6–15.9)
LYMPH%: 40.3 % (ref 14.0–49.7)
MCH: 30.4 pg (ref 25.1–34.0)
MCHC: 32.7 g/dL (ref 31.5–36.0)
MCV: 93.1 fL (ref 79.5–101.0)
MONO#: 1.4 10*3/uL — ABNORMAL HIGH (ref 0.1–0.9)
MONO%: 15 % — ABNORMAL HIGH (ref 0.0–14.0)
NEUT%: 41.6 % (ref 38.4–76.8)
NEUTROS ABS: 3.9 10*3/uL (ref 1.5–6.5)
PLATELETS: 157 10*3/uL (ref 145–400)
RBC: 4.47 10*6/uL (ref 3.70–5.45)
RDW: 13.4 % (ref 11.2–14.5)
WBC: 9.4 10*3/uL (ref 3.9–10.3)
lymph#: 3.8 10*3/uL — ABNORMAL HIGH (ref 0.9–3.3)

## 2015-06-27 LAB — TECHNOLOGIST REVIEW

## 2015-06-27 NOTE — Progress Notes (Signed)
  [place] OFFICE PROGRESS NOTE   Diagnosis: Non-Hodgkin's lymphoma, colon cancer  INTERVAL HISTORY:   Tracy Mclaughlin returns as scheduled. She feels well. She reports a single episode of "dark "stool after taking a non-steroidal anti-inflammatory medication. She discontinued this medication. She stopped Xarelto for one day and there is been no recurrence of the dark stool. She is not taking iron.  Objective:  Vital signs in last 24 hours:  Blood pressure 137/59, pulse 70, temperature 98.1 F (36.7 C), temperature source Oral, resp. rate 18, height 5\' 7"  (1.702 m), weight 226 lb 1.6 oz (102.558 kg), SpO2 99 %.    HEENT: Neck without mass Lymphatics: No cervical, supra-clavicular, axillary, or inguinal nodes Resp: Lungs clear bilaterally Cardio: Regular rate and rhythm GI: No hepatosplenomegaly, no mass Vascular: No leg edema   Lab Results:  Lab Results  Component Value Date   WBC 9.4 06/27/2015   HGB 13.6 06/27/2015   HCT 41.6 06/27/2015   MCV 93.1 06/27/2015   PLT 157 06/27/2015   NEUTROABS 3.9 06/27/2015   Absalom cycle 3.8 Medications: I have reviewed the patient's current medications.  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 Status post weekly Rituxan x4 03/01/2012 through 03/22/2012. She completed 4 "maintenance "doses of Rituxan, last on 12/19/2012. A restaging CT on 02/09/2013 showed no evidence of lymphoma.   Lymph node lateral to the thyroid bed on a neck ultrasound 02/21/2014, status post an FNA biopsy concerning for a lymphoproliferative disorder. 2. Stage IV (T1bN1b M0) papillary thyroid cancer, status post a thyroidectomy with reimplantation of the left superior parathyroid gland on 05/23/2012, status post radioactive iodine therapy, followed by Dr. Buddy Duty.  3. Stage II (T3 N0) colon cancer, status post a right colectomy  10/19/2011, last colonoscopy April 2015-sigmoid adenoma removed.  4. History of a pulmonary embolism December 2012.  5. History of Atrial fibrillation-maintained on Xarelto.  6. Iron deficiency anemia-new 03/18/2014. Hemoccult positive stool. The anemia corrected with iron.   Status post an upper endoscopy and colonoscopy by Dr. Carlean Purl April 2015 with no bleeding source identified, benign adenoma removed from the sigmoid colon.    Disposition:  She remains in clinical remission from colon cancer and lymphoma. There is a mild lymphocytosis on the CBC today. Tracy Mclaughlin will return for an office visit and CBC in 4 months. She will contact us in the interim for new symptoms.  Betsy Coder, MD  06/27/2015  10:48 AM

## 2015-06-27 NOTE — Telephone Encounter (Signed)
Pt confirmed labs/ov per 07/08 POF, gave pt AVS and Calendar.... KJ

## 2015-07-01 ENCOUNTER — Other Ambulatory Visit (HOSPITAL_BASED_OUTPATIENT_CLINIC_OR_DEPARTMENT_OTHER): Payer: Medicare Other

## 2015-07-01 ENCOUNTER — Telehealth: Payer: Self-pay | Admitting: Oncology

## 2015-07-01 ENCOUNTER — Other Ambulatory Visit: Payer: Self-pay | Admitting: *Deleted

## 2015-07-01 ENCOUNTER — Telehealth: Payer: Self-pay | Admitting: *Deleted

## 2015-07-01 DIAGNOSIS — D72820 Lymphocytosis (symptomatic): Secondary | ICD-10-CM | POA: Diagnosis present

## 2015-07-01 DIAGNOSIS — C189 Malignant neoplasm of colon, unspecified: Secondary | ICD-10-CM

## 2015-07-01 NOTE — Telephone Encounter (Signed)
Added appt per pof °

## 2015-07-01 NOTE — Telephone Encounter (Signed)
TC from pt this am @ 8:45 am requesting CEA results from 06/27/15. No results available-test cancelled per lab. TC to lab, apparently blood sample obtained in wrong tube and pt had left before they had a chance to re-draw.TC to patient to let her know.   Unfortunately she lives in [place] and has to drive >1 hour to return for labs.  She states she can come today.   pof sent in for labs today for CEA.

## 2015-07-02 ENCOUNTER — Telehealth: Payer: Self-pay | Admitting: *Deleted

## 2015-07-02 LAB — CEA: CEA: 2.8 ng/mL (ref 0.0–5.0)

## 2015-07-02 NOTE — Telephone Encounter (Signed)
Called pt with normal CEA results. She voiced understanding, appreciation for call.

## 2015-07-02 NOTE — Telephone Encounter (Signed)
-----   Message from [name], MD sent at 07/02/2015  4:01 PM EDT ----- Please call patient, Tracy Mclaughlin is normal

## 2015-09-12 ENCOUNTER — Telehealth: Payer: Self-pay | Admitting: *Deleted

## 2015-09-12 NOTE — Telephone Encounter (Signed)
Patient called in stating that she has been having dark stools for the past 3 days. Patient is currently off Xarelto due to the dark stools. RN notified Ned Card NP/MD Benay Spice. Instructed patient to call GI doctor/PCP. Patient verbalized understanding.

## 2015-10-28 ENCOUNTER — Other Ambulatory Visit (HOSPITAL_BASED_OUTPATIENT_CLINIC_OR_DEPARTMENT_OTHER): Payer: Medicare Other

## 2015-10-28 ENCOUNTER — Ambulatory Visit (HOSPITAL_BASED_OUTPATIENT_CLINIC_OR_DEPARTMENT_OTHER): Payer: Medicare Other | Admitting: Oncology

## 2015-10-28 ENCOUNTER — Telehealth: Payer: Self-pay | Admitting: Oncology

## 2015-10-28 VITALS — BP 123/70 | HR 66 | Temp 98.0°F | Resp 17 | Ht 67.0 in | Wt 226.1 lb

## 2015-10-28 DIAGNOSIS — C8580 Other specified types of non-Hodgkin lymphoma, unspecified site: Secondary | ICD-10-CM

## 2015-10-28 DIAGNOSIS — D72820 Lymphocytosis (symptomatic): Secondary | ICD-10-CM

## 2015-10-28 DIAGNOSIS — Z85038 Personal history of other malignant neoplasm of large intestine: Secondary | ICD-10-CM | POA: Diagnosis not present

## 2015-10-28 DIAGNOSIS — Z8572 Personal history of non-Hodgkin lymphomas: Secondary | ICD-10-CM | POA: Diagnosis not present

## 2015-10-28 DIAGNOSIS — C189 Malignant neoplasm of colon, unspecified: Secondary | ICD-10-CM

## 2015-10-28 DIAGNOSIS — C8590 Non-Hodgkin lymphoma, unspecified, unspecified site: Secondary | ICD-10-CM

## 2015-10-28 LAB — CBC WITH DIFFERENTIAL/PLATELET
BASO%: 1.1 % (ref 0.0–2.0)
Basophils Absolute: 0.1 10*3/uL (ref 0.0–0.1)
EOS%: 2.6 % (ref 0.0–7.0)
Eosinophils Absolute: 0.3 10*3/uL (ref 0.0–0.5)
HEMATOCRIT: 42.5 % (ref 34.8–46.6)
HGB: 13.9 g/dL (ref 11.6–15.9)
LYMPH#: 6.5 10*3/uL — AB (ref 0.9–3.3)
LYMPH%: 51.6 % — ABNORMAL HIGH (ref 14.0–49.7)
MCH: 31.4 pg (ref 25.1–34.0)
MCHC: 32.8 g/dL (ref 31.5–36.0)
MCV: 95.7 fL (ref 79.5–101.0)
MONO#: 0.4 10*3/uL (ref 0.1–0.9)
MONO%: 3.3 % (ref 0.0–14.0)
NEUT#: 5.2 10*3/uL (ref 1.5–6.5)
NEUT%: 41.4 % (ref 38.4–76.8)
Platelets: 147 10*3/uL (ref 145–400)
RBC: 4.44 10*6/uL (ref 3.70–5.45)
RDW: 14.3 % (ref 11.2–14.5)
WBC: 12.6 10*3/uL — ABNORMAL HIGH (ref 3.9–10.3)

## 2015-10-28 LAB — TECHNOLOGIST REVIEW

## 2015-10-28 NOTE — Telephone Encounter (Signed)
Requested records from [name]

## 2015-10-28 NOTE — Progress Notes (Signed)
  [place] OFFICE PROGRESS NOTE   Diagnosis: Colon cancer, marginal zone lymphoma  INTERVAL HISTORY:   Tracy Mclaughlin returns as scheduled. She reports feeling the best that she has in years. She no longer has exertional dyspnea. She reports a recent upper GI bleed while on Xarelto. She was admitted in [name] and reports undergoing an upper endoscopy. She is now taking aspirin. She received IV iron.  Objective:  Vital signs in last 24 hours:  Blood pressure 123/70, pulse 66, temperature 98 F (36.7 C), temperature source Oral, resp. rate 17, height 5\' 7"  (1.702 m), weight 226 lb 1.6 oz (102.558 kg), SpO2 100 %.    HEENT: Neck without mass Lymphatics: No cervical, supraclavicular, or inguinal nodes. Soft mobile 1/2-1 cm bilateral axillary nodes versus prominent fat pads Resp: Lungs clear bilaterally Cardio: Regular rate and rhythm GI: No hepatosplenomegaly, nontender, no mass Vascular: Leg edema  Lab Results:  Lab Results  Component Value Date   WBC 12.6* 10/28/2015   HGB 13.9 10/28/2015   HCT 42.5 10/28/2015   MCV 95.7 10/28/2015   PLT 147 10/28/2015   NEUTROABS 5.2 10/28/2015   absolute lymphocyte count 6.5    Lab Results  Component Value Date   CEA 2.8 07/01/2015     Medications: I have reviewed the patient's current medications.  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 Status post weekly Rituxan x4 03/01/2012 through 03/22/2012. She completed 4 "maintenance "doses of Rituxan, last on 12/19/2012. A restaging CT on 02/09/2013 showed no evidence of lymphoma.   Lymph node lateral to the thyroid bed on a neck ultrasound 02/21/2014, status post an FNA biopsy concerning for a lymphoproliferative disorder. 2. Stage IV (T1bN1b M0) papillary thyroid cancer, status post a thyroidectomy with reimplantation of the left superior  parathyroid gland on 05/23/2012, status post radioactive iodine therapy, followed by Dr. Buddy Duty.  3. Stage II (T3 N0) colon cancer, status post a right colectomy 10/19/2011, last colonoscopy April 2015-sigmoid adenoma removed.  4. History of a pulmonary embolism December 2012.  5. History of Atrial fibrillation 6. Iron deficiency anemia-new 03/18/2014. Hemoccult positive stool. The anemia corrected with iron.   Status post an upper endoscopy and colonoscopy by Dr. Carlean Purl April 2015 with no bleeding source identified, benign adenoma removed from the sigmoid colon. 7. Report of an upper gastric intestinal bleed fall 2016-managed in [place]. [name]  Disposition:  She remains in clinical remission from colon cancer. No clinical evidence for progression of the low-grade lymphoma other than a mild increase in the peripheral lymphocytosis. There is no indication for treatment at present. She will return for an office and lab visit in 4 months. She will follow-up with Dr. Waldemar Dickens after the recent upper GI bleed and to determine the need for continuing iron.  Tracy Coder, MD  10/28/2015  11:37 AM

## 2015-10-28 NOTE — Telephone Encounter (Signed)
Faxed provider note (10/28/15) to PCP

## 2015-10-28 NOTE — Telephone Encounter (Signed)
per pof to sch pt appt-gave pt copy of av °

## 2016-02-26 ENCOUNTER — Other Ambulatory Visit (HOSPITAL_BASED_OUTPATIENT_CLINIC_OR_DEPARTMENT_OTHER): Payer: Medicare Other

## 2016-02-26 ENCOUNTER — Ambulatory Visit (HOSPITAL_BASED_OUTPATIENT_CLINIC_OR_DEPARTMENT_OTHER): Payer: Medicare Other | Admitting: Oncology

## 2016-02-26 VITALS — BP 153/63 | HR 73 | Temp 98.2°F | Resp 18 | Ht 67.0 in | Wt 226.6 lb

## 2016-02-26 DIAGNOSIS — D509 Iron deficiency anemia, unspecified: Secondary | ICD-10-CM

## 2016-02-26 DIAGNOSIS — D759 Disease of blood and blood-forming organs, unspecified: Secondary | ICD-10-CM | POA: Diagnosis not present

## 2016-02-26 DIAGNOSIS — C189 Malignant neoplasm of colon, unspecified: Secondary | ICD-10-CM

## 2016-02-26 DIAGNOSIS — D72820 Lymphocytosis (symptomatic): Secondary | ICD-10-CM

## 2016-02-26 DIAGNOSIS — C8510 Unspecified B-cell lymphoma, unspecified site: Secondary | ICD-10-CM

## 2016-02-26 DIAGNOSIS — C8580 Other specified types of non-Hodgkin lymphoma, unspecified site: Secondary | ICD-10-CM

## 2016-02-26 DIAGNOSIS — C182 Malignant neoplasm of ascending colon: Secondary | ICD-10-CM

## 2016-02-26 DIAGNOSIS — Z8572 Personal history of non-Hodgkin lymphomas: Secondary | ICD-10-CM

## 2016-02-26 DIAGNOSIS — Z85038 Personal history of other malignant neoplasm of large intestine: Secondary | ICD-10-CM

## 2016-02-26 LAB — CBC WITH DIFFERENTIAL/PLATELET
BASO%: 0.7 % (ref 0.0–2.0)
Basophils Absolute: 0.1 10*3/uL (ref 0.0–0.1)
EOS%: 2.3 % (ref 0.0–7.0)
Eosinophils Absolute: 0.4 10*3/uL (ref 0.0–0.5)
HCT: 45.5 % (ref 34.8–46.6)
HEMOGLOBIN: 14.8 g/dL (ref 11.6–15.9)
LYMPH%: 66.9 % — ABNORMAL HIGH (ref 14.0–49.7)
MCH: 31.2 pg (ref 25.1–34.0)
MCHC: 32.6 g/dL (ref 31.5–36.0)
MCV: 95.6 fL (ref 79.5–101.0)
MONO#: 0.5 10*3/uL (ref 0.1–0.9)
MONO%: 3.2 % (ref 0.0–14.0)
NEUT%: 26.9 % — ABNORMAL LOW (ref 38.4–76.8)
NEUTROS ABS: 4.3 10*3/uL (ref 1.5–6.5)
Platelets: 128 10*3/uL — ABNORMAL LOW (ref 145–400)
RBC: 4.76 10*6/uL (ref 3.70–5.45)
RDW: 15 % — AB (ref 11.2–14.5)
WBC: 15.9 10*3/uL — AB (ref 3.9–10.3)
lymph#: 10.6 10*3/uL — ABNORMAL HIGH (ref 0.9–3.3)

## 2016-02-26 LAB — TECHNOLOGIST REVIEW

## 2016-02-26 NOTE — Progress Notes (Signed)
  [place] OFFICE PROGRESS NOTE   Diagnosis: Non-Hodgkin's lymphoma, colon cancer  INTERVAL HISTORY:   Tracy Mclaughlin returns as scheduled. She feels well. No bleeding. Good appetite. She plans to have a total knee replacement in the near future.  Objective:  Vital signs in last 24 hours:  Blood pressure 153/63, pulse 73, temperature 98.2 F (36.8 C), temperature source Oral, resp. rate 18, height 5\' 7"  (1.702 m), weight 226 lb 9.6 oz (102.785 kg), SpO2 98 %.    HEENT: Neck without mass Lymphatics: No cervical, supraclavicular, axillary, or inguinal nodes Resp: Lungs clear bilaterally Cardio: Reglan rate and rhythm GI: No hepatomegaly, no mass Vascular: No leg edema   Lab Results:  Lab Results  Component Value Date   WBC 15.9* 02/26/2016   HGB 14.8 02/26/2016   HCT 45.5 02/26/2016   MCV 95.6 02/26/2016   PLT 128* 02/26/2016   NEUTROABS 4.3 02/26/2016  Absolute lymphocyte count 10.6   Medications: I have reviewed the patient's current medications.  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 Status post weekly Rituxan x4 03/01/2012 through 03/22/2012. She completed 4 "maintenance "doses of Rituxan, last on 12/19/2012. A restaging CT on 02/09/2013 showed no evidence of lymphoma.   Lymph node lateral to the thyroid bed on a neck ultrasound 02/21/2014, status post an FNA biopsy concerning for a lymphoproliferative disorder. 2. Stage IV (T1bN1b M0) papillary thyroid cancer, status post a thyroidectomy with reimplantation of the left superior parathyroid gland on 05/23/2012, status post radioactive iodine therapy, followed by Dr. Buddy Duty.  3. Stage II (T3 N0) colon cancer, status post a right colectomy 10/19/2011, last colonoscopy April 2015-sigmoid adenoma removed.  4. History of a pulmonary embolism December 2012.  5. History of Atrial  fibrillation 6. Iron deficiency anemia-new 03/18/2014. Hemoccult positive stool. The anemia corrected with iron. No longer taking iron.  Status post an upper endoscopy and colonoscopy by Dr. Carlean Purl April 2015 with no bleeding source identified, benign adenoma removed from the sigmoid colon. 7. Report of an upper gastric intestinal bleed fall 2016-managed in [place]. [name]    Disposition:  Tracy Mclaughlin appears stable. She remains in clinical remission from colon cancer. She is asymptomatic from the marginal zone lymphoma. She has a mild lymphocytosis and mild promise cytopenia.  She plans to undergo knee replacement surgery in the near future. I gave her a copy of the CBC from today to take to her orthopedic physician.  She will return for an office visit and CBC in 4 months.  Betsy Coder, MD  02/26/2016  1:46 PM

## 2016-02-27 ENCOUNTER — Telehealth: Payer: Self-pay | Admitting: *Deleted

## 2016-02-27 LAB — CEA: CEA: 3.2 ng/mL (ref 0.0–4.7)

## 2016-02-27 LAB — CEA (PARALLEL TESTING): CEA: 2.4 ng/mL

## 2016-02-27 NOTE — Telephone Encounter (Signed)
-----   Message from [name], MD sent at 02/27/2016 10:09 AM EST ----- Please call patient, cea is normal

## 2016-02-27 NOTE — Telephone Encounter (Signed)
Spoke with pt's husband, informed him of normal lab. He voiced understanding.

## 2016-03-31 ENCOUNTER — Telehealth: Payer: Self-pay | Admitting: Interventional Cardiology

## 2016-03-31 NOTE — Telephone Encounter (Signed)
New message  ERROR

## 2016-04-01 ENCOUNTER — Ambulatory Visit (INDEPENDENT_AMBULATORY_CARE_PROVIDER_SITE_OTHER): Payer: Medicare Other | Admitting: Physician Assistant

## 2016-04-01 ENCOUNTER — Encounter: Payer: Self-pay | Admitting: Physician Assistant

## 2016-04-01 VITALS — BP 140/70 | HR 73 | Ht 67.0 in | Wt 226.8 lb

## 2016-04-01 DIAGNOSIS — I480 Paroxysmal atrial fibrillation: Secondary | ICD-10-CM | POA: Diagnosis not present

## 2016-04-01 DIAGNOSIS — D509 Iron deficiency anemia, unspecified: Secondary | ICD-10-CM | POA: Diagnosis not present

## 2016-04-01 NOTE — Progress Notes (Signed)
Cardiology Office Note   Date:  04/01/2016   ID:  Tracy Mclaughlin, DOB 12-07-1934, MRN [id number]  PCP:  No primary care provider on file.  Cardiologist:  Dr. Tamala Julian Referred for preop evaluation by Dr. Encarnacion Slates at [facility] in [facility]   Chief Complaint  Patient presents with  . Follow-up    seen for Dr. Tamala Julian  . Pre-op Exam      History of Present Illness: Tracy Mclaughlin is a 82 y.o. female who presents for cardiology follow-up. She has past medical history of stage IV papillary thyroid carcinoma s/p thyroidectomy, postsurgical hypothyroidism, history of paroxysmal atrial fibrillation on ASA, history of PE 11/2011, PAT, colon cancer s/p resection, hypertension and non-Hodgkin's lymphoma. She was last seen by Dr. Tamala Julian in the office on 07/29/2014. She did have severe anemia related to GI bleeding in 2015 and was placed on iron therapy. Upper endoscopy and colonoscopy performed in April 2015 showed no bleeding source, benign adenoma removed from sigmoid colon. She has also been followed by Dr. Benay Spice with Oncology. She planned to have L nee replacement surgery in the near future.  Patient was previously on Xarelto, however after 2 episodes of severe upper GI bleed, she stopped taking Xarelto and has since been on 81mg  aspirin. She has not noticed any recurrent sign of atrial fibrillation for the past year. She stays active working in her garden and denies ever having exertional chest discomfort or shortness of breath. Recently however her activity level has been limited by worsening left knee problem. He has been seen by Dr. Maricela Bo at [facility] in [place]. [place] who plan to have surgical procedure done on the left knee. She presents today for cardiology follow-up and preoperative clearance. Overall she has been feeling very well. As previously stated she denies any exertional symptom. She denies any further bleeding after switching from Xarelto  to aspirin. We have discussed benefit and risk of systemic anticoagulation, she understands she will have higher risk of stroke however we all agreed on the fact that her bleeding risk outweighs the risk of stroke at this time. I am not sure what type of systemic anticoagulation she will receive after knee replacement surgery, however she may be able to tolerate it if it is short-term.    Past Medical History  Diagnosis Date  . Hypertension   . Arthritis   . Subarachnoid hemorrhage ([place]) 1996  . Diverticulosis   . Non Hodgkin's lymphoma ([place])     Non-Hodgkin's B-Cell Lymphoma  . Cancer ([place])     Thyroid/Colon/ B cell Lymphoma-remission  . History of atrial fibrillation     Was in only in a- fib for 2 days  . Pulmonary embolus ([place]) 11/2011  . Thyroid disease   . Paroxysmal atrial fibrillation (HCC)   . Papillary thyroid carcinoma ([place])   . Post-surgical hypothyroidism   . Atrial fibrillation ([place])   . Anemia     Past Surgical History  Procedure Laterality Date  . Knee arthroscopy Left 1998    S/P  Arthroscopic Left knee surgery  . Tuboplasty / tubotubal anastomosis    . Subdural hematoma evacuation via craniotomy    . Rotator cuff repair Right 1998  . Colon resection  11/12    mucinous adenocarcinoma of the cecum, resected  . Thyroidectomy  05/23/2012    Procedure: THYROIDECTOMY;  Surgeon: [name], MD;  Location: [facility];  Service: General;  Laterality: N/A;  Total  thyroidectomy, central compartment lymph node biopsy times two, reimplantation left superior parathyroid gland.  Marland Kitchen Appendectomy  1959  . Cholecystectomy  1999    S/P Lap Cholecystectomy  . Colonoscopy  10/05/2011  . Esophagogastroduodenoscopy N/A 04/17/2014    Procedure: ESOPHAGOGASTRODUODENOSCOPY (EGD);  Surgeon: Gatha Mayer, MD;  Location: [facility];  Service: Endoscopy;  Laterality: N/A;  . Colonoscopy N/A 04/17/2014    Procedure: COLONOSCOPY;  Surgeon: Gatha Mayer, MD;  Location: [facility];   Service: Endoscopy;  Laterality: N/A;     Current Outpatient Prescriptions  Medication Sig Dispense Refill  . aspirin 81 MG tablet Take 81 mg by mouth daily. Reported on 02/26/2016    . citalopram (CELEXA) 40 MG tablet Take 40 mg by mouth daily.    Marland Kitchen diltiazem (DILACOR XR) 180 MG 24 hr capsule Take 1 capsule (180 mg total) by mouth daily. 30 capsule 5  . diphenhydrAMINE (BENADRYL) 25 MG tablet Take 25 mg by mouth at bedtime as needed. For itching/sleep    . lansoprazole (PREVACID) 30 MG capsule Take 30 mg by mouth daily at 12 noon.     Marland Kitchen losartan (COZAAR) 100 MG tablet Take 100 mg by mouth daily.    Marland Kitchen SYNTHROID 112 MCG tablet Take 112 mcg by mouth daily before breakfast.      No current facility-administered medications for this visit.    Allergies:   Review of patient's allergies indicates no known allergies.    Social History:  The patient  reports that she quit smoking about 46 years ago. Her smoking use included Cigarettes. She has a 12 pack-year smoking history. She has never used smokeless tobacco. She reports that she drinks alcohol. She reports that she does not use illicit drugs.   Family History:  The patient's family history includes Coronary artery disease in her mother; Diabetes in her mother and sister; Hypertension in her mother and sister; Kidney cancer in her sister; Lung cancer in her brother; Stroke in her sister. There is no history of Anesthesia problems or Heart attack.    ROS:  Please see the history of present illness.   Otherwise, review of systems are positive for None.   All other systems are reviewed and negative.    PHYSICAL EXAM: VS:  BP 140/70 mmHg  Pulse 73  Ht 5\' 7"  (1.702 m)  Wt 226 lb 12.8 oz (102.876 kg)  BMI 35.51 kg/m2  SpO2 93% , BMI Body mass index is 35.51 kg/(m^2). GEN: Well nourished, well developed, in no acute distress HEENT: normal Neck: no JVD, carotid bruits, or masses Cardiac: RRR; no murmurs, rubs, or gallops,no edema    Respiratory:  clear to auscultation bilaterally, normal work of breathing GI: soft, nontender, nondistended, + BS MS: no deformity or atrophy Skin: warm and dry, no rash Neuro:  Strength and sensation are intact Psych: euthymic mood, full affect   EKG:  EKG is ordered today. The ekg ordered today demonstrates NSR without significant ST-T wave changes   Recent Labs: 02/26/2016: HGB 14.8; Platelets 128*    Lipid Panel No results found for: CHOL, TRIG, HDL, CHOLHDL, VLDL, LDLCALC, LDLDIRECT    Wt Readings from Last 3 Encounters:  04/01/16 226 lb 12.8 oz (102.876 kg)  02/26/16 226 lb 9.6 oz (102.785 kg)  10/28/15 226 lb 1.6 oz (102.558 kg)      Other studies Reviewed: Additional studies/ records that were reviewed today include:    Review of the above records demonstrates:      ASSESSMENT  AND PLAN:  1.  Preop clearance L knee replacement  - She has no exertional symptom. She does have paroxysmal atrial fibrillation, however no recent recurrence in the last year. She is cleared surgery without further workup from cardiology perspective as I feel her risk of procedure is within tolerable limit given proposed benefit. The case was discussed with the patient's primary cardiologist Dr. Tamala Julian, who agrees with this plan. I will arrange 3 month follow-up after the surgery. Cardiology will be available in perioperative phase if needed.   2. history of paroxysmal atrial fibrillation on ASA  - CHA2DS2-Vasc score 4 (age, female, HTN)  - no recurrence in last year  3. stage IV papillary thyroid carcinoma s/p thyroidectomy: per oncology  4. postsurgical hypothyroidism: per IM  5. history of PE 11/2011: no obvious recurrence  6. Hypertension: BP 140/70, however per patient, her BP usually in 120-130s range at home, will hold off on increasing medication.  8. non-Hodgkin's lymphoma: per oncology   Current medicines are reviewed at length with the patient today.  The patient does  not have concerns regarding medicines.  The following changes have been made:  no change  Labs/ tests ordered today include:   Orders Placed This Encounter  Procedures  . EKG 12-Lead     Disposition:   FU with Dr. Tamala Julian in 3 months  Signed, Almyra Deforest, [place]  04/01/2016 4:52 PM    [address], [place], [place]  [address] Phone: [phone]; Fax: [phone]

## 2016-04-01 NOTE — Patient Instructions (Signed)
Medication Instructions:  Your physician recommends that you continue on your current medications as directed. Please refer to the Current Medication list given to you today.   Labwork: -None  Testing/Procedures: -None  Follow-Up: Your physician recommends that you keep your scheduled  follow-up appointment with Dr. Thull.   Any Other Special Instructions Will Be Listed Below (If Applicable).     If you need a refill on your cardiac medications before your next appointment, please call your pharmacy.   

## 2016-04-06 ENCOUNTER — Telehealth: Payer: Self-pay | Admitting: Interventional Cardiology

## 2016-04-06 NOTE — Telephone Encounter (Signed)
She was cleared for upcoming knee surgery by Almyra Deforest, PAC on 03/31/16. I agreed with the decision. Please find out where the clearance needs to be sent and forward his note.

## 2016-04-06 NOTE — Telephone Encounter (Signed)
New Message:    She wants to know if you received her fax for clearance. It was sent on  03-26-16,need this back asap please.

## 2016-04-07 NOTE — Telephone Encounter (Signed)
Cardiac clearance placed in MR nurse fax box to be faxed to [place] office Attn: Hassan Rowan  Fax # [phone]

## 2016-06-20 ENCOUNTER — Telehealth: Payer: Self-pay | Admitting: Oncology

## 2016-06-20 NOTE — Telephone Encounter (Signed)
PAL - moved 7/13 appointments to 8/3. Confirmed with patient's spouse.

## 2016-07-01 ENCOUNTER — Other Ambulatory Visit: Payer: Medicare Other

## 2016-07-01 ENCOUNTER — Ambulatory Visit: Payer: Medicare Other | Admitting: Oncology

## 2016-07-07 DIAGNOSIS — I480 Paroxysmal atrial fibrillation: Secondary | ICD-10-CM | POA: Insufficient documentation

## 2016-07-07 NOTE — Progress Notes (Signed)
Cardiology Office Note    Date:  07/08/2016   ID:  Tracy Mclaughlin, DOB 1934/02/20, MRN [id number]  PCP:  No primary care provider on file.  Cardiologist: Sinclair Grooms, MD   Chief Complaint  Patient presents with  . Atrial Fibrillation    History of Present Illness:  Tracy Mclaughlin is a 82 y.o. female for follow-up. She has past medical history of stage IV papillary thyroid carcinoma s/p thyroidectomy, postsurgical hypothyroidism, history of paroxysmal atrial fibrillation on ASA, history of PE 11/2011, PAT, colon cancer s/p resection, hypertension, GI bleeding, and non-Hodgkin's lymphoma.  Had left knee replacement in [place] at [facility] with good results. No cardiac complications. Denies palpitations and recurrent atrial fibrillation.  Past Medical History  Diagnosis Date  . Hypertension   . Arthritis   . Subarachnoid hemorrhage ([place]) 1996  . Diverticulosis   . Non Hodgkin's lymphoma ([place])     Non-Hodgkin's B-Cell Lymphoma  . Cancer ([place])     Thyroid/Colon/ B cell Lymphoma-remission  . History of atrial fibrillation     Was in only in a- fib for 2 days  . Pulmonary embolus ([place]) 11/2011  . Thyroid disease   . Paroxysmal atrial fibrillation (HCC)   . Papillary thyroid carcinoma ([place])   . Post-surgical hypothyroidism   . Atrial fibrillation ([place])   . Anemia     Past Surgical History  Procedure Laterality Date  . Knee arthroscopy Left 1998    S/P  Arthroscopic Left knee surgery  . Tuboplasty / tubotubal anastomosis    . Subdural hematoma evacuation via craniotomy    . Rotator cuff repair Right 1998  . Colon resection  11/12    mucinous adenocarcinoma of the cecum, resected  . Thyroidectomy  05/23/2012    Procedure: THYROIDECTOMY;  Surgeon: [name], MD;  Location: [facility];  Service: General;  Laterality: N/A;  Total thyroidectomy, central compartment lymph node biopsy times two, reimplantation left superior parathyroid gland.  Marland Kitchen  Appendectomy  1959  . Cholecystectomy  1999    S/P Lap Cholecystectomy  . Colonoscopy  10/05/2011  . Esophagogastroduodenoscopy N/A 04/17/2014    Procedure: ESOPHAGOGASTRODUODENOSCOPY (EGD);  Surgeon: Gatha Mayer, MD;  Location: [facility];  Service: Endoscopy;  Laterality: N/A;  . Colonoscopy N/A 04/17/2014    Procedure: COLONOSCOPY;  Surgeon: Gatha Mayer, MD;  Location: [facility];  Service: Endoscopy;  Laterality: N/A;    Current Medications: Outpatient Prescriptions Prior to Visit  Medication Sig Dispense Refill  . aspirin 81 MG tablet Take 81 mg by mouth daily. Reported on 02/26/2016    . citalopram (CELEXA) 40 MG tablet Take 40 mg by mouth daily.    Marland Kitchen diltiazem (DILACOR XR) 180 MG 24 hr capsule Take 1 capsule (180 mg total) by mouth daily. 30 capsule 5  . diphenhydrAMINE (BENADRYL) 25 MG tablet Take 25 mg by mouth at bedtime as needed. For itching/sleep    . losartan (COZAAR) 100 MG tablet Take 100 mg by mouth daily.    Marland Kitchen SYNTHROID 112 MCG tablet Take 112 mcg by mouth daily before breakfast.     . lansoprazole (PREVACID) 30 MG capsule Take 30 mg by mouth daily at 12 noon.      No facility-administered medications prior to visit.     Allergies:   Review of patient's allergies indicates no known allergies.   Social History   Social History  . Marital Status: Married    Spouse Name: N/A  .  Number of Children: 0  . Years of Education: N/A   Occupational History  . retired    Social History Main Topics  . Smoking status: Former Smoker -- 1.00 packs/day for 12 years    Types: Cigarettes    Quit date: 08/23/1969  . Smokeless tobacco: Never Used  . Alcohol Use: Yes     Comment: 1 manhattens per night  . Drug Use: No  . Sexual Activity: Not Asked   Other Topics Concern  . None   Social History Narrative     Family History:  The patient's family history includes Coronary artery disease in her mother; Diabetes in her mother and sister; Hypertension in her mother  and sister; Kidney cancer in her sister; Lung cancer in her brother; Stroke in her sister. There is no history of Anesthesia problems or Heart attack.   ROS:   Please see the history of present illness.    Recurrence of lymphoma. She has easy bruising. Skin rash occasionally. Difficulty with balance and excessive fatigue.  All other systems reviewed and are negative.   PHYSICAL EXAM:   VS:  BP 130/68 mmHg  Pulse 68  Ht 5\' 8"  (1.727 m)  Wt 223 lb 12.8 oz (101.515 kg)  BMI 34.04 kg/m2   GEN: Well nourished, well developed, in no acute distress. Morbidly obese with weight gain HEENT: normal Neck: no JVD, carotid bruits, or masses Cardiac: RRR; no murmurs, rubs, or gallops,no edema  Respiratory:  clear to auscultation bilaterally, normal work of breathing GI: soft, nontender, nondistended, + BS MS: no deformity or atrophy Skin: warm and dry, no rash Neuro:  Alert and Oriented x 3, Strength and sensation are intact Psych: euthymic mood, full affect  Wt Readings from Last 3 Encounters:  07/08/16 223 lb 12.8 oz (101.515 kg)  04/01/16 226 lb 12.8 oz (102.876 kg)  02/26/16 226 lb 9.6 oz (102.785 kg)      Studies/Labs Reviewed:   EKG:  EKG  Is not repeated  Recent Labs: 02/26/2016: HGB 14.8; Platelets 128*   Lipid Panel No results found for: CHOL, TRIG, HDL, CHOLHDL, VLDL, LDLCALC, LDLDIRECT  Additional studies/ records that were reviewed today include:  No new data    ASSESSMENT:    1. Essential hypertension   2. Long term (current) use of anticoagulants-xarelto - afib and hx PE   3. PAF (paroxysmal atrial fibrillation) (HCC)   4. Pulmonary embolism, other ([place])      PLAN:  In order of problems listed above:  1. Low salt diet and to pressure monitoring. Discussed target 130/90 mercury mercury or less. Aerobic activity and weight loss stress. 2. Anticoagulation therapy is been discontinued 3. No significant clinical recurrences    Medication Adjustments/Labs and  Tests Ordered: Current medicines are reviewed at length with the patient today.  Concerns regarding medicines are outlined above.  Medication changes, Labs and Tests ordered today are listed in the Patient Instructions below. There are no Patient Instructions on file for this visit.   Signed, Sinclair Grooms, MD  07/08/2016 10:21 AM    [facility] [address], [place], [place]  [address] Phone: [phone]; Fax: [phone]

## 2016-07-08 ENCOUNTER — Encounter: Payer: Self-pay | Admitting: Interventional Cardiology

## 2016-07-08 ENCOUNTER — Ambulatory Visit (INDEPENDENT_AMBULATORY_CARE_PROVIDER_SITE_OTHER): Payer: Medicare Other | Admitting: Interventional Cardiology

## 2016-07-08 VITALS — BP 130/68 | HR 68 | Ht 68.0 in | Wt 223.8 lb

## 2016-07-08 DIAGNOSIS — I10 Essential (primary) hypertension: Secondary | ICD-10-CM | POA: Diagnosis not present

## 2016-07-08 DIAGNOSIS — I2699 Other pulmonary embolism without acute cor pulmonale: Secondary | ICD-10-CM | POA: Diagnosis not present

## 2016-07-08 DIAGNOSIS — I480 Paroxysmal atrial fibrillation: Secondary | ICD-10-CM | POA: Diagnosis not present

## 2016-07-08 DIAGNOSIS — Z7901 Long term (current) use of anticoagulants: Secondary | ICD-10-CM

## 2016-07-08 NOTE — Patient Instructions (Signed)
Medication Instructions:  None  Labwork: None  Testing/Procedures: None  Follow-Up: Your physician wants you to follow-up in: 1 year with Dr. Marcin.  You will receive a reminder letter in the mail two months in advance. If you don't receive a letter, please call our office to schedule the follow-up appointment.   Any Other Special Instructions Will Be Listed Below (If Applicable).     If you need a refill on your cardiac medications before your next appointment, please call your pharmacy.   

## 2016-07-22 ENCOUNTER — Other Ambulatory Visit (HOSPITAL_BASED_OUTPATIENT_CLINIC_OR_DEPARTMENT_OTHER): Payer: Medicare Other

## 2016-07-22 ENCOUNTER — Ambulatory Visit (HOSPITAL_BASED_OUTPATIENT_CLINIC_OR_DEPARTMENT_OTHER): Payer: Medicare Other | Admitting: Oncology

## 2016-07-22 ENCOUNTER — Telehealth: Payer: Self-pay | Admitting: Oncology

## 2016-07-22 VITALS — BP 120/43 | HR 77 | Temp 98.2°F | Resp 18 | Wt 219.9 lb

## 2016-07-22 DIAGNOSIS — C182 Malignant neoplasm of ascending colon: Secondary | ICD-10-CM

## 2016-07-22 DIAGNOSIS — C8261 Cutaneous follicle center lymphoma, lymph nodes of head, face, and neck: Secondary | ICD-10-CM

## 2016-07-22 DIAGNOSIS — R5381 Other malaise: Secondary | ICD-10-CM | POA: Diagnosis not present

## 2016-07-22 DIAGNOSIS — D72820 Lymphocytosis (symptomatic): Secondary | ICD-10-CM

## 2016-07-22 DIAGNOSIS — R21 Rash and other nonspecific skin eruption: Secondary | ICD-10-CM | POA: Diagnosis not present

## 2016-07-22 DIAGNOSIS — C8510 Unspecified B-cell lymphoma, unspecified site: Secondary | ICD-10-CM

## 2016-07-22 LAB — CBC WITH DIFFERENTIAL/PLATELET
BASO%: 1 % (ref 0.0–2.0)
Basophils Absolute: 0.3 10*3/uL — ABNORMAL HIGH (ref 0.0–0.1)
EOS%: 1.8 % (ref 0.0–7.0)
Eosinophils Absolute: 0.5 10*3/uL (ref 0.0–0.5)
HEMATOCRIT: 45 % (ref 34.8–46.6)
HGB: 14.6 g/dL (ref 11.6–15.9)
LYMPH%: 75.9 % — ABNORMAL HIGH (ref 14.0–49.7)
MCH: 30.7 pg (ref 25.1–34.0)
MCHC: 32.4 g/dL (ref 31.5–36.0)
MCV: 94.7 fL (ref 79.5–101.0)
MONO#: 1.1 10*3/uL — ABNORMAL HIGH (ref 0.1–0.9)
MONO%: 3.5 % (ref 0.0–14.0)
NEUT#: 5.3 10*3/uL (ref 1.5–6.5)
NEUT%: 17.8 % — AB (ref 38.4–76.8)
Platelets: 131 10*3/uL — ABNORMAL LOW (ref 145–400)
RBC: 4.75 10*6/uL (ref 3.70–5.45)
RDW: 13.7 % (ref 11.2–14.5)
WBC: 29.8 10*3/uL — ABNORMAL HIGH (ref 3.9–10.3)
lymph#: 22.6 10*3/uL — ABNORMAL HIGH (ref 0.9–3.3)

## 2016-07-22 LAB — CEA (IN HOUSE-CHCC): CEA (CHCC-In House): 3.16 ng/mL (ref 0.00–5.00)

## 2016-07-22 LAB — TECHNOLOGIST REVIEW

## 2016-07-22 NOTE — Telephone Encounter (Signed)
Gave pt cal & avs °

## 2016-07-22 NOTE — Progress Notes (Signed)
  [facility] OFFICE PROGRESS NOTE   Diagnosis: Non-Hodgkin's lymphoma, colon cancer  INTERVAL HISTORY:   Ms. [name] returns as scheduled. She underwent left knee replacement surgery in May. She reports the surgery went well. She complains of pruritus the past 3-4 months. She has an erythematous rash at the back and neck. She says that she had a similar rash prior to beginning treatment for lymphoma years ago. She complains of malaise. No dyspnea. She has a poor appetite. No bleeding.  Objective:  Vital signs in last 24 hours:  Blood pressure (!) 120/43, pulse 77, temperature 98.2 F (36.8 C), temperature source Oral, resp. rate 18, weight 219 lb 14.4 oz (99.7 kg), SpO2 96 %.    HEENT: Neck without mass Lymphatics: 1/2 cm left retroauricular node, no cervical or supraclavicular nodes. Prominent bilateral axillary fat pads. No inguinal nodes. Resp: Lungs clear bilaterally Cardio: Irregular GI: No hepatosplenomegaly, no mass Vascular: No leg edema  Skin: Plaques of erythema at the left and right lower back. Similar area at the left neck. Ecchymoses at the lower back bilaterally    Lab Results:  Lab Results  Component Value Date   WBC 29.8 (H) 07/22/2016   HGB 14.6 07/22/2016   HCT 45.0 07/22/2016   MCV 94.7 07/22/2016   PLT 131 (L) 07/22/2016   NEUTROABS 5.3 07/22/2016     Lab Results  Component Value Date   CEA1 3.2 02/26/2016    Medications: I have reviewed the patient's current medications.  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 Status post weekly Rituxan x4 03/01/2012 through 03/22/2012. She completed 4 "maintenance "doses of Rituxan, last on 12/19/2012. A restaging CT on 02/09/2013 showed no evidence of lymphoma.   Lymph node lateral to the thyroid bed on a neck ultrasound 02/21/2014, status post an FNA biopsy  concerning for a lymphoproliferative disorder. 2. Stage IV (T1bN1b M0) papillary thyroid cancer, status post a thyroidectomy with reimplantation of the left superior parathyroid gland on 05/23/2012, status post radioactive iodine therapy, followed by Dr. Buddy Duty.  3. Stage II (T3 N0) colon cancer, status post a right colectomy 10/19/2011, last colonoscopy April 2015-sigmoid adenoma removed.  4. History of a pulmonary embolism December 2012.  5. History of Atrial fibrillation 6. Iron deficiency anemia-new 03/18/2014. Hemoccult positive stool. The anemia corrected with iron. No longer taking iron.  Status post an upper endoscopy and colonoscopy by Dr. Carlean Purl April 2015 with no bleeding source identified, benign adenoma removed from the sigmoid colon. 7. Report of an upper gastric intestinal bleed fall 2016-managed in [place]. [name] 8. 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 Pruritic rash 07/22/2016     Disposition:  Tracy Mclaughlin has a history of low-grade non-Hodgkin's lymphoma. There is a progressive peripheral lymphocytosis. She reports malaise. She otherwise has no symptoms related to lymphoma. She has a plaque type rash over the trunk. I doubt this is related to skin involvement by lymphoma.  We discussed indications for resuming systemic therapy. We discussed rituximab, chemotherapy, and other immunotherapy options. She reports tolerating rituximab well in the past.  [name] will like to hold on resuming systemic treatment for now. She will return for an office visit in 6 weeks. She will contact us in the interim as needed. I will encourage her to see a dermatologist if the skin rash does not improve.  Betsy Coder, MD  07/22/2016  3:18 PM

## 2016-07-23 LAB — CEA: CEA1: 2.7 ng/mL (ref 0.0–4.7)

## 2016-09-02 ENCOUNTER — Telehealth: Payer: Self-pay | Admitting: Nurse Practitioner

## 2016-09-02 ENCOUNTER — Ambulatory Visit (HOSPITAL_BASED_OUTPATIENT_CLINIC_OR_DEPARTMENT_OTHER): Payer: Medicare Other | Admitting: Nurse Practitioner

## 2016-09-02 ENCOUNTER — Other Ambulatory Visit (HOSPITAL_BASED_OUTPATIENT_CLINIC_OR_DEPARTMENT_OTHER): Payer: Medicare Other

## 2016-09-02 VITALS — BP 147/46 | HR 83 | Temp 98.1°F | Resp 17 | Ht 68.0 in | Wt 216.1 lb

## 2016-09-02 DIAGNOSIS — R634 Abnormal weight loss: Secondary | ICD-10-CM

## 2016-09-02 DIAGNOSIS — R630 Anorexia: Secondary | ICD-10-CM | POA: Diagnosis not present

## 2016-09-02 DIAGNOSIS — C8261 Cutaneous follicle center lymphoma, lymph nodes of head, face, and neck: Secondary | ICD-10-CM

## 2016-09-02 DIAGNOSIS — Z23 Encounter for immunization: Secondary | ICD-10-CM

## 2016-09-02 DIAGNOSIS — D72820 Lymphocytosis (symptomatic): Secondary | ICD-10-CM

## 2016-09-02 DIAGNOSIS — R21 Rash and other nonspecific skin eruption: Secondary | ICD-10-CM | POA: Diagnosis not present

## 2016-09-02 LAB — COMPREHENSIVE METABOLIC PANEL
ALT: 23 U/L (ref 0–55)
AST: 36 U/L — AB (ref 5–34)
Albumin: 3.9 g/dL (ref 3.5–5.0)
Alkaline Phosphatase: 138 U/L (ref 40–150)
Anion Gap: 10 mEq/L (ref 3–11)
BUN: 21.1 mg/dL (ref 7.0–26.0)
CHLORIDE: 105 meq/L (ref 98–109)
CO2: 26 meq/L (ref 22–29)
Calcium: 9.7 mg/dL (ref 8.4–10.4)
Creatinine: 0.9 mg/dL (ref 0.6–1.1)
EGFR: 60 mL/min/{1.73_m2} — ABNORMAL LOW (ref >90)
GLUCOSE: 100 mg/dL (ref 70–140)
POTASSIUM: 4.5 meq/L (ref 3.5–5.1)
SODIUM: 141 meq/L (ref 136–145)
Total Bilirubin: 0.88 mg/dL (ref 0.20–1.20)
Total Protein: 6.9 g/dL (ref 6.4–8.3)

## 2016-09-02 LAB — CBC WITH DIFFERENTIAL/PLATELET
BASO%: 0.5 % (ref 0.0–2.0)
Basophils Absolute: 0.2 10*3/uL — ABNORMAL HIGH (ref 0.0–0.1)
EOS%: 1.3 % (ref 0.0–7.0)
Eosinophils Absolute: 0.5 10*3/uL (ref 0.0–0.5)
HCT: 44.5 % (ref 34.8–46.6)
HGB: 14.2 g/dL (ref 11.6–15.9)
LYMPH%: 85.2 % — ABNORMAL HIGH (ref 14.0–49.7)
MCH: 29.8 pg (ref 25.1–34.0)
MCHC: 31.9 g/dL (ref 31.5–36.0)
MCV: 93.4 fL (ref 79.5–101.0)
MONO#: 0.1 10*3/uL (ref 0.1–0.9)
MONO%: 0.2 % (ref 0.0–14.0)
NEUT%: 12.8 % — ABNORMAL LOW (ref 38.4–76.8)
NEUTROS ABS: 4.5 10*3/uL (ref 1.5–6.5)
PLATELETS: 111 10*3/uL — AB (ref 145–400)
RBC: 4.76 10*6/uL (ref 3.70–5.45)
RDW: 13.4 % (ref 11.2–14.5)
WBC: 35.5 10*3/uL — ABNORMAL HIGH (ref 3.9–10.3)
lymph#: 30.2 10*3/uL — ABNORMAL HIGH (ref 0.9–3.3)

## 2016-09-02 LAB — LACTATE DEHYDROGENASE: LDH: 483 U/L — ABNORMAL HIGH (ref 125–245)

## 2016-09-02 LAB — TECHNOLOGIST REVIEW

## 2016-09-02 MED ORDER — INFLUENZA VAC SPLIT QUAD 0.5 ML IM SUSY
0.5000 mL | PREFILLED_SYRINGE | Freq: Once | INTRAMUSCULAR | Status: AC
  Administered 2016-09-02: 0.5 mL via INTRAMUSCULAR
  Filled 2016-09-02: qty 0.5

## 2016-09-02 NOTE — Telephone Encounter (Signed)
Avs report and schedule given to patient, per 09/02/16 los. °

## 2016-09-02 NOTE — Progress Notes (Addendum)
Tracy [facility]cl[facility] OFFICE PROGRESS NOTE   Diagnosis:  Non-Hodgkin's lymphoma, colon cancer  INTERVAL HISTORY:   Tracy Mclaughlin returns as scheduled. She continues to feel fatigued. No fevers or night sweats. Appetite is poor. She is losing weight. She continues to note scattered areas of rash with associated pruritus. She thinks she has developed some lymph nodes on the left neck.  Objective:  Vital signs in last 24 hours:  Blood pressure (!) 147/46, pulse 83, temperature 98.1 F (36.7 C), resp. rate 17, height 5\' 8"  (1.727 m), weight 216 lb 1.6 oz (98 kg), SpO2 96 %.    HEENT: No thrush or ulcers. Lymphatics: No palpable cervical, supra radicular or axillary lymph nodes. Resp: Lungs clear bilaterally. Cardio: Regular rate and rhythm. GI: Abdomen soft and nontender. No organomegaly. Vascular: No leg edema. Skin: Plaques of erythema at the left neck, right chest, left and right lower back.   Lab Results:  Lab Results  Component Value Date   WBC 35.5 (H) 09/02/2016   HGB 14.2 09/02/2016   HCT 44.5 09/02/2016   MCV 93.4 09/02/2016   PLT 111 (L) 09/02/2016   NEUTROABS 4.5 09/02/2016    Imaging:  No results found.  Medications: I have reviewed the patient's current medications.  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 Status post weekly Rituxan x4 03/01/2012 through 03/22/2012. She completed 4 "maintenance "doses of Rituxan, last on 12/19/2012. A restaging CT on 02/09/2013 showed no evidence of lymphoma.   Lymph node lateral to the thyroid bed on a neck ultrasound 02/21/2014, status post an FNA biopsy concerning for a lymphoproliferative disorder. 2. Stage IV (T1bN1b M0) papillary thyroid cancer, status post a thyroidectomy with reimplantation of the left superior parathyroid gland on 05/23/2012, status post radioactive iodine therapy,  followed by Dr. Buddy Duty.  3. Stage II (T3 N0) colon cancer, status post a right colectomy 10/19/2011, last colonoscopy April 2015-sigmoid adenoma removed.  4. History of a pulmonary embolism December 2012.  5. History of Atrial fibrillation 6. Iron deficiency anemia-new 03/18/2014. Hemoccult positive stool. The anemia corrected with iron. No longer taking iron.  Status post an upper endoscopy and colonoscopy by Dr. Carlean Purl April 2015 with no bleeding source identified, benign adenoma removed from the sigmoid colon. 7. Report of an upper gastric intestinal bleed fall 2016-managed in [place]. [name] 8. 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 Pruritic rash 07/22/2016    Disposition: Tracy Mclaughlin appears stable. She has a progressive peripheral lymphocytosis, malaise and anorexia with minimal weight loss over the past 6 weeks. We discussed continued observation versus resuming systemic therapy with Rituxan. The decision was made to continue to follow with observation.  The etiology of the skin rash is unclear. We recommended she follow-up with her dermatologist.  She will return for a follow-up visit and labs in 6 weeks. She understands to contact the office in the interim with worsening of current symptoms or onset of new symptoms such as fever/sweats, palpable peripheral adenopathy.  Patient seen with Dr. Benay Spice. 25 minutes were spent face-to-face at today's visit with the majority of that time involved in counseling/coordination of care.  Ned Card ANP/GNP-BC   09/02/2016  11:41 AM  This was a shared visit with Ned Card. Tracy Mclaughlin was interviewed and examined. She will contact us for new symptoms.We will initiate systemic therapy if she develops clear evidence of progressive lymphoma.  Julieanne Manson, M.D.

## 2016-09-03 ENCOUNTER — Telehealth: Payer: Self-pay | Admitting: *Deleted

## 2016-09-03 ENCOUNTER — Other Ambulatory Visit: Payer: Self-pay | Admitting: *Deleted

## 2016-09-03 DIAGNOSIS — C8261 Cutaneous follicle center lymphoma, lymph nodes of head, face, and neck: Secondary | ICD-10-CM

## 2016-09-03 NOTE — Telephone Encounter (Signed)
Per Dr. Benay Spice, pt notified that LDH is high, which could be a marker of lymphoma progression and that appointments will be moved up from 10/07/16 to 09/20/16 to see L. Marcello Moores NP with lab appointment.  Pt appreciative of call and has no questions at this time.

## 2016-09-03 NOTE — Telephone Encounter (Signed)
-----   Message from [name], MD sent at 09/03/2016  9:24 AM EDT ----- Please call patient, Tracy Mclaughlin is high- can be a marker of lymphoma progression, please move f/u office and lab to [place] or lisa to week of 10/2, repeat Tracy Mclaughlin 10/2

## 2016-09-20 ENCOUNTER — Other Ambulatory Visit (HOSPITAL_BASED_OUTPATIENT_CLINIC_OR_DEPARTMENT_OTHER): Payer: Medicare Other

## 2016-09-20 ENCOUNTER — Ambulatory Visit (HOSPITAL_BASED_OUTPATIENT_CLINIC_OR_DEPARTMENT_OTHER): Payer: Medicare Other | Admitting: Nurse Practitioner

## 2016-09-20 ENCOUNTER — Telehealth: Payer: Self-pay | Admitting: Oncology

## 2016-09-20 VITALS — BP 133/56 | HR 67 | Temp 98.2°F | Resp 16 | Ht 68.0 in | Wt 213.5 lb

## 2016-09-20 DIAGNOSIS — C8261 Cutaneous follicle center lymphoma, lymph nodes of head, face, and neck: Secondary | ICD-10-CM

## 2016-09-20 DIAGNOSIS — R634 Abnormal weight loss: Secondary | ICD-10-CM | POA: Diagnosis not present

## 2016-09-20 DIAGNOSIS — D72820 Lymphocytosis (symptomatic): Secondary | ICD-10-CM | POA: Diagnosis not present

## 2016-09-20 DIAGNOSIS — C8307 Small cell B-cell lymphoma, spleen: Secondary | ICD-10-CM

## 2016-09-20 LAB — CBC WITH DIFFERENTIAL/PLATELET
BASO%: 0.3 % (ref 0.0–2.0)
BASOS ABS: 0.1 10*3/uL (ref 0.0–0.1)
EOS ABS: 0.4 10*3/uL (ref 0.0–0.5)
EOS%: 1.1 % (ref 0.0–7.0)
HCT: 43.7 % (ref 34.8–46.6)
HEMOGLOBIN: 13.9 g/dL (ref 11.6–15.9)
LYMPH#: 31.6 10*3/uL — AB (ref 0.9–3.3)
LYMPH%: 82.7 % — ABNORMAL HIGH (ref 14.0–49.7)
MCH: 29.7 pg (ref 25.1–34.0)
MCHC: 31.9 g/dL (ref 31.5–36.0)
MCV: 93.1 fL (ref 79.5–101.0)
MONO#: 0.9 10*3/uL (ref 0.1–0.9)
MONO%: 2.4 % (ref 0.0–14.0)
NEUT#: 5.2 10*3/uL (ref 1.5–6.5)
NEUT%: 13.5 % — ABNORMAL LOW (ref 38.4–76.8)
PLATELETS: 110 10*3/uL — AB (ref 145–400)
RBC: 4.7 10*6/uL (ref 3.70–5.45)
RDW: 14.2 % (ref 11.2–14.5)
WBC: 38.3 10*3/uL — ABNORMAL HIGH (ref 3.9–10.3)

## 2016-09-20 LAB — COMPREHENSIVE METABOLIC PANEL
ALBUMIN: 3.7 g/dL (ref 3.5–5.0)
ALK PHOS: 150 U/L (ref 40–150)
ALT: 18 U/L (ref 0–55)
ANION GAP: 10 meq/L (ref 3–11)
AST: 31 U/L (ref 5–34)
BILIRUBIN TOTAL: 0.93 mg/dL (ref 0.20–1.20)
BUN: 23 mg/dL (ref 7.0–26.0)
CALCIUM: 9.3 mg/dL (ref 8.4–10.4)
CO2: 25 mEq/L (ref 22–29)
Chloride: 107 mEq/L (ref 98–109)
Creatinine: 1.1 mg/dL (ref 0.6–1.1)
EGFR: 48 mL/min/{1.73_m2} — AB (ref >90)
GLUCOSE: 101 mg/dL (ref 70–140)
POTASSIUM: 5 meq/L (ref 3.5–5.1)
SODIUM: 142 meq/L (ref 136–145)
TOTAL PROTEIN: 6.7 g/dL (ref 6.4–8.3)

## 2016-09-20 LAB — TECHNOLOGIST REVIEW

## 2016-09-20 LAB — LACTATE DEHYDROGENASE: LDH: 474 U/L — AB (ref 125–245)

## 2016-09-20 NOTE — Progress Notes (Addendum)
[place] OFFICE PROGRESS NOTE   Diagnosis:  Non-Hodgkin's lymphoma, colon cancer  INTERVAL HISTORY:   Tracy Mclaughlin returns as scheduled. She reports continued slow weight loss. She describes her appetite is "fair". No fevers or sweats. She fatigues easily. The skin rash has resolved.  Objective:  Vital signs in last 24 hours:  Blood pressure (!) 133/56, pulse 67, temperature 98.2 F (36.8 C), temperature source Oral, resp. rate 16, height 5\' 8"  (1.727 m), weight 213 lb 8 oz (96.8 kg), SpO2 98 %.    HEENT: No thrush or ulcers. Lymphatics: One to 2 cm left low neck/medial supraclavicular lymph node; 1 cm left axillary lymph node; 1 cm left inguinal lymph node. Resp: Lungs clear bilaterally. Cardio: Regular rate and rhythm. GI: No organomegaly. Vascular: No leg edema.    Lab Results:  Lab Results  Component Value Date   WBC 38.3 (H) 09/20/2016   HGB 13.9 09/20/2016   HCT 43.7 09/20/2016   MCV 93.1 09/20/2016   PLT 110 (L) 09/20/2016   NEUTROABS 5.2 09/20/2016    Imaging:  No results found.  Medications: I have reviewed the patient's current medications.  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 Status post weekly Rituxan x4 03/01/2012 through 03/22/2012. She completed 4 "maintenance "doses of Rituxan, last on 12/19/2012. A restaging CT on 02/09/2013 showed no evidence of lymphoma.   Lymph node lateral to the thyroid bed on a neck ultrasound 02/21/2014, status post an FNA biopsy concerning for a lymphoproliferative disorder. 2. Stage IV (T1bN1b M0) papillary thyroid cancer, status post a thyroidectomy with reimplantation of the left superior parathyroid gland on 05/23/2012, status post radioactive iodine therapy, followed by Dr. Buddy Duty.  3. Stage II (T3 N0) colon cancer, status post a right colectomy 10/19/2011, last colonoscopy  April 2015-sigmoid adenoma removed.  4. History of a pulmonary embolism December 2012.  5. History of Atrial fibrillation 6. Iron deficiency anemia-new 03/18/2014. Hemoccult positive stool. The anemia corrected with iron. No longer taking iron.  Status post an upper endoscopy and colonoscopy by Dr. Carlean Purl April 2015 with no bleeding source identified, benign adenoma removed from the sigmoid colon. 7. Report of an upper gastric intestinal bleed fall 2016-managed in [place]. [name] 8. 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 Pruritic rash 07/22/2016    Disposition: Tracy Mclaughlin appears appears to have peripheral adenopathy on exam today. This, in combination with weight loss, fatigue/malaise, elevated LDH, progressive peripheral lymphocytosis is concerning for progression of the lymphoma. We are referring her for a restaging PET scan. Dr. Benay Spice recommends initiating treatment with bendamustine/Rituxan. We reviewed potential toxicities associate with bendamustine including myelosuppression, nausea, hair loss, pneumonitis, rash. She has had Rituxan in the past is familiar with the potential toxicities. We reviewed these at today's visit. She is agreeable to proceed.  We will try to schedule the PET scan this week. We will see her back on 09/29/2016 to review the results and tentatively proceed with cycle 1 bendamustine/Rituxan.  She will contact the office prior to her next visit with any problems.  Patient seen with Dr. Benay Spice. 25 minutes were spent face-to-face at today's visit with the majority of that time involved in counseling/coordination of care.  Ned Card ANP/GNP-BC   09/20/2016  2:34 PM  This was a shared visit with Ned Card. Tracy Mclaughlin was interviewed and examined. There are small palpable lymph nodes in the neck and inguinal regions. She is losing  weight. The LDH was elevated last month. There is a progressive lymphocytosis.  She will be referred for a staging PET scan. The plan is to  proceed with systemic therapy if the PET scan is consistent with active lymphoma.  We will decide on treatment with single agent rituximab versus bendamustine/rituximab-based on the extent of disease on the staging PET and her performance status when she returns next week.  Julieanne Manson, M.D.

## 2016-09-20 NOTE — Telephone Encounter (Signed)
GAVE PATIENT AVS REPORT AND APPOINTMENTS FOR October  °

## 2016-09-27 ENCOUNTER — Other Ambulatory Visit: Payer: Self-pay | Admitting: Oncology

## 2016-09-28 ENCOUNTER — Encounter (HOSPITAL_COMMUNITY)
Admission: RE | Admit: 2016-09-28 | Discharge: 2016-09-28 | Disposition: A | Discharge status: Home / Self Care | Payer: Medicare Other | Source: Ambulatory Visit | Attending: Nurse Practitioner | Admitting: Nurse Practitioner

## 2016-09-28 DIAGNOSIS — C8307 Small cell B-cell lymphoma, spleen: Secondary | ICD-10-CM | POA: Insufficient documentation

## 2016-09-28 LAB — GLUCOSE, CAPILLARY: GLUCOSE-CAPILLARY: 106 mg/dL — AB (ref 65–99)

## 2016-09-28 MED ORDER — FLUDEOXYGLUCOSE F - 18 (FDG) INJECTION: 10.5000 | Freq: Once | INTRAVENOUS | Status: DC | PRN

## 2016-09-29 ENCOUNTER — Encounter: Payer: Self-pay | Admitting: Nurse Practitioner

## 2016-09-29 ENCOUNTER — Ambulatory Visit: Payer: Medicare Other | Admitting: Nurse Practitioner

## 2016-09-29 ENCOUNTER — Telehealth: Payer: Self-pay | Admitting: Nurse Practitioner

## 2016-09-29 ENCOUNTER — Ambulatory Visit: Payer: Medicare Other

## 2016-09-29 ENCOUNTER — Ambulatory Visit (HOSPITAL_BASED_OUTPATIENT_CLINIC_OR_DEPARTMENT_OTHER): Payer: Medicare Other | Admitting: Nurse Practitioner

## 2016-09-29 ENCOUNTER — Other Ambulatory Visit (HOSPITAL_BASED_OUTPATIENT_CLINIC_OR_DEPARTMENT_OTHER): Payer: Medicare Other

## 2016-09-29 ENCOUNTER — Telehealth: Payer: Self-pay | Admitting: *Deleted

## 2016-09-29 ENCOUNTER — Ambulatory Visit (HOSPITAL_BASED_OUTPATIENT_CLINIC_OR_DEPARTMENT_OTHER): Payer: Medicare Other

## 2016-09-29 VITALS — BP 110/48 | HR 78 | Temp 98.8°F | Resp 17

## 2016-09-29 VITALS — BP 104/36 | HR 73 | Temp 98.3°F | Resp 18 | Ht 68.0 in | Wt 212.8 lb

## 2016-09-29 DIAGNOSIS — C8307 Small cell B-cell lymphoma, spleen: Secondary | ICD-10-CM

## 2016-09-29 DIAGNOSIS — C8297 Follicular lymphoma, unspecified, spleen: Secondary | ICD-10-CM

## 2016-09-29 DIAGNOSIS — Z86711 Personal history of pulmonary embolism: Secondary | ICD-10-CM | POA: Diagnosis not present

## 2016-09-29 DIAGNOSIS — C8261 Cutaneous follicle center lymphoma, lymph nodes of head, face, and neck: Secondary | ICD-10-CM

## 2016-09-29 DIAGNOSIS — C8267 Cutaneous follicle center lymphoma, spleen: Secondary | ICD-10-CM | POA: Diagnosis not present

## 2016-09-29 DIAGNOSIS — T7840XA Allergy, unspecified, initial encounter: Secondary | ICD-10-CM

## 2016-09-29 DIAGNOSIS — Z5112 Encounter for antineoplastic immunotherapy: Secondary | ICD-10-CM

## 2016-09-29 DIAGNOSIS — C8338 Diffuse large B-cell lymphoma, lymph nodes of multiple sites: Secondary | ICD-10-CM

## 2016-09-29 DIAGNOSIS — D72820 Lymphocytosis (symptomatic): Secondary | ICD-10-CM

## 2016-09-29 DIAGNOSIS — C8510 Unspecified B-cell lymphoma, unspecified site: Secondary | ICD-10-CM

## 2016-09-29 LAB — COMPREHENSIVE METABOLIC PANEL
ALT: 15 U/L (ref 0–55)
AST: 31 U/L (ref 5–34)
Albumin: 3.7 g/dL (ref 3.5–5.0)
Alkaline Phosphatase: 140 U/L (ref 40–150)
Anion Gap: 10 mEq/L (ref 3–11)
BUN: 17.7 mg/dL (ref 7.0–26.0)
CO2: 23 mEq/L (ref 22–29)
Calcium: 9.2 mg/dL (ref 8.4–10.4)
Chloride: 109 mEq/L (ref 98–109)
Creatinine: 0.8 mg/dL (ref 0.6–1.1)
EGFR: 66 mL/min/{1.73_m2} — ABNORMAL LOW (ref >90)
Glucose: 106 mg/dl (ref 70–140)
Potassium: 4.6 mEq/L (ref 3.5–5.1)
Sodium: 142 mEq/L (ref 136–145)
Total Bilirubin: 1.08 mg/dL (ref 0.20–1.20)
Total Protein: 6.5 g/dL (ref 6.4–8.3)

## 2016-09-29 LAB — CBC WITH DIFFERENTIAL/PLATELET
BASO%: 0.2 % (ref 0.0–2.0)
Basophils Absolute: 0.1 10*3/uL (ref 0.0–0.1)
EOS ABS: 0.5 10*3/uL (ref 0.0–0.5)
EOS%: 1.3 % (ref 0.0–7.0)
HCT: 43.2 % (ref 34.8–46.6)
HEMOGLOBIN: 13.7 g/dL (ref 11.6–15.9)
LYMPH%: 86.9 % — ABNORMAL HIGH (ref 14.0–49.7)
MCH: 29.6 pg (ref 25.1–34.0)
MCHC: 31.8 g/dL (ref 31.5–36.0)
MCV: 92.9 fL (ref 79.5–101.0)
MONO#: 0.2 10*3/uL (ref 0.1–0.9)
MONO%: 0.6 % (ref 0.0–14.0)
NEUT%: 11 % — ABNORMAL LOW (ref 38.4–76.8)
NEUTROS ABS: 4 10*3/uL (ref 1.5–6.5)
PLATELETS: 97 10*3/uL — AB (ref 145–400)
RBC: 4.65 10*6/uL (ref 3.70–5.45)
RDW: 14.6 % — AB (ref 11.2–14.5)
WBC: 36.1 10*3/uL — ABNORMAL HIGH (ref 3.9–10.3)
lymph#: 31.3 10*3/uL — ABNORMAL HIGH (ref 0.9–3.3)

## 2016-09-29 LAB — TECHNOLOGIST REVIEW

## 2016-09-29 LAB — LACTATE DEHYDROGENASE: LDH: 457 U/L — ABNORMAL HIGH (ref 125–245)

## 2016-09-29 MED ORDER — ACETAMINOPHEN 325 MG PO TABS
ORAL_TABLET | ORAL | Status: AC
  Filled 2016-09-29: qty 2

## 2016-09-29 MED ORDER — ACETAMINOPHEN 325 MG PO TABS
650.0000 mg | ORAL_TABLET | Freq: Once | ORAL | Status: AC
  Administered 2016-09-29: 650 mg via ORAL

## 2016-09-29 MED ORDER — MEPERIDINE HCL 25 MG/ML IJ SOLN
12.5000 mg | Freq: Once | INTRAMUSCULAR | Status: AC
  Administered 2016-09-29: 12.5 mg via INTRAVENOUS

## 2016-09-29 MED ORDER — MEPERIDINE HCL 25 MG/ML IJ SOLN
INTRAMUSCULAR | Status: AC
  Filled 2016-09-29: qty 1

## 2016-09-29 MED ORDER — ONDANSETRON HCL 4 MG/2ML IJ SOLN: 8.0000 mg | Freq: Once | INTRAMUSCULAR | Status: DC

## 2016-09-29 MED ORDER — SODIUM CHLORIDE 0.9 % IV SOLN
375.0000 mg/m2 | Freq: Once | INTRAVENOUS | Status: AC
  Administered 2016-09-29: 800 mg via INTRAVENOUS
  Filled 2016-09-29: qty 30

## 2016-09-29 MED ORDER — LORAZEPAM 2 MG/ML IJ SOLN
INTRAMUSCULAR | Status: AC
  Filled 2016-09-29: qty 1

## 2016-09-29 MED ORDER — DIPHENHYDRAMINE HCL 25 MG PO CAPS
ORAL_CAPSULE | ORAL | Status: AC
  Filled 2016-09-29: qty 1

## 2016-09-29 MED ORDER — DIPHENHYDRAMINE HCL 25 MG PO CAPS
50.0000 mg | ORAL_CAPSULE | Freq: Once | ORAL | Status: AC
Start: 2016-09-29 — End: 2016-09-29
  Administered 2016-09-29: 50 mg via ORAL

## 2016-09-29 MED ORDER — FAMOTIDINE IN NACL 20-0.9 MG/50ML-% IV SOLN
20.0000 mg | Freq: Once | INTRAVENOUS | Status: AC | PRN
  Administered 2016-09-29: 20 mg via INTRAVENOUS

## 2016-09-29 MED ORDER — SODIUM CHLORIDE 0.9 % IV SOLN
Freq: Once | INTRAVENOUS | Status: AC
  Administered 2016-09-29: 12:00:00 via INTRAVENOUS

## 2016-09-29 MED ORDER — LORAZEPAM 2 MG/ML IJ SOLN
0.5000 mg | Freq: Once | INTRAMUSCULAR | Status: AC
Start: 2016-09-29 — End: 2016-09-29
  Administered 2016-09-29: 0.5 mg via INTRAVENOUS

## 2016-09-29 MED ORDER — METHYLPREDNISOLONE SODIUM SUCC 125 MG IJ SOLR
125.0000 mg | Freq: Once | INTRAMUSCULAR | Status: AC | PRN
  Administered 2016-09-29: 125 mg via INTRAVENOUS

## 2016-09-29 MED ORDER — SODIUM CHLORIDE 0.9 % IV SOLN: Freq: Once | INTRAVENOUS | Status: DC

## 2016-09-29 MED ORDER — SODIUM CHLORIDE 0.9 % IV SOLN
Freq: Once | INTRAVENOUS | Status: AC
  Administered 2016-09-29: 16:00:00 via INTRAVENOUS
  Filled 2016-09-29: qty 4

## 2016-09-29 NOTE — Assessment & Plan Note (Signed)
Patient presented to the [facility] today to receive her first cycle of Rituxan therapy.  She had received Rituxan approximate 5 years ago; and stated that she had had a hypersensitivity reaction at that time.  She experienced significant rigors during the Rituxan infusion; and the infusion was held per protocol.  Confirmed the patient was given Benadryl as a premedication.  Patient was given Pepcid, Solu-Medrol, and Demerol per protocol.  All symptoms resolved; and patient was able to then proceed with the Rituxan infusion.  However, patient then became nauseous on several different occasions-most likely secondary to the Demerol she had received.  She was then given Ativan 0.5 mg IV and Zofran with fairly good effect.  Patient is slowly increasing the Rituxan infusion as tolerated.  Patient will finish as much of the Rituxan infusion this evening as possible.  The plan of the future will be to have the patient initiate the Rituxan infusion in the future.  Clear and the day; and planning to call at a much slower rate.  Hopefully, patient will experienced no further difficulties with cycle 2 of the same infusion.  Dr. Benay Spice aware of all, and in agreement with this plan of care.

## 2016-09-29 NOTE — Patient Instructions (Signed)
[facility] Discharge Instructions for Patients Receiving Chemotherapy  Today you received the following chemotherapy agents Rituxan To help prevent nausea and vomiting after your treatment, we encourage you to take your nausea medication as prescribed.  If you develop nausea and vomiting that is not controlled by your nausea medication, call the clinic.   BELOW ARE SYMPTOMS THAT SHOULD BE REPORTED IMMEDIATELY:  *FEVER GREATER THAN 100.5 F  *CHILLS WITH OR WITHOUT FEVER  NAUSEA AND VOMITING THAT IS NOT CONTROLLED WITH YOUR NAUSEA MEDICATION  *UNUSUAL SHORTNESS OF BREATH  *UNUSUAL BRUISING OR BLEEDING  TENDERNESS IN MOUTH AND THROAT WITH OR WITHOUT PRESENCE OF ULCERS  *URINARY PROBLEMS  *BOWEL PROBLEMS  UNUSUAL RASH Items with * indicate a potential emergency and should be followed up as soon as possible.  Feel free to call the clinic you have any questions or concerns. The clinic phone number is [phone].  Please show the CHEMO ALERT CARD at check-in to the Emergency Department and triage nurse.   

## 2016-09-29 NOTE — Progress Notes (Signed)
1532:  Pt started vomitting.  Rituxan stopped.  8mg  IV zofran given.  VSS

## 2016-09-29 NOTE — Telephone Encounter (Signed)
Per the LOS I have scheduled appts and notified the scheduler 

## 2016-09-29 NOTE — Progress Notes (Signed)
1340- Patient started having rigors just prior to 3rd titration of rituxan. Infusion reaction protocol initiated. Infusion stopped. NS ran and reaction medications given (solumedrol, pepcid, and demerol - See MAR).   [place], NP at chairside. Patient does not have any other complaints.  1403-Rigors continue. Demerol given.  1415- Rigors subsided. Per Selena Lesser, resume rituxan.  1420- Patient reports nausea and has an emesis episode. Kerry Fort, NP contacted again. Patient was given ativan.  1440-Rituxan infusion resumed. Patient vitals are stable and no complaints at present.

## 2016-09-29 NOTE — Telephone Encounter (Signed)
Message sent to chemo scheduler to add chemo. Chemo 09/30/16 cancelled, per 09/29/16 los. Avs report and appointment schedule given to patient per 09/29/16 los.

## 2016-09-29 NOTE — Assessment & Plan Note (Signed)
Patient presented to the [facility] today to receive her first cycle of Rituxan therapy.  She had received Rituxan approximate 5 years ago; and stated that she had had a hypersensitivity reaction at that time.  She experienced significant rigors during the Rituxan infusion; and the infusion was held per protocol.  Confirmed the patient was given Benadryl as a premedication.  Patient was given Pepcid, Solu-Medrol, and Demerol per protocol.  All symptoms resolved; and patient was able to then proceed with the Rituxan infusion.  However, patient then became nauseous on several different occasions-most likely secondary to the Demerol she had received.  She was then given Ativan 0.5 mg IV and Zofran with fairly good effect.  Patient is slowly increasing the Rituxan infusion as tolerated.  Patient will finish as much of the Rituxan infusion this evening as possible.  The plan of the future will be to have the patient initiate the Rituxan infusion in the future.  Clear and the day; and planning to call at a much slower rate.  Hopefully, patient will experienced no further difficulties with cycle 2 of the same infusion.  Dr. Benay Spice aware of all, and in agreement with this plan of care.

## 2016-09-29 NOTE — Progress Notes (Signed)
SYMPTOM MANAGEMENT CLINIC    Chief Complaint: Hypersensitivity reaction  HPI:  Tracy Mclaughlin 82 y.o. female diagnosed with lymphoma.  Presented to the [facility] today to   receive her first Rituxan infusion.  Patient had Rituxan in the past 25 years ago.  No history exists.    Review of Systems  Constitutional: Positive for malaise/fatigue.  Gastrointestinal: Positive for nausea and vomiting.  All other systems reviewed and are negative.   Past Medical History:  Diagnosis Date  . Anemia   . Arthritis   . Atrial fibrillation ([place])   . Cancer ([place])    Thyroid/Colon/ B cell Lymphoma-remission  . Diverticulosis   . History of atrial fibrillation    Was in only in a- fib for 2 days  . Hypertension   . Non Hodgkin's lymphoma ([place])    Non-Hodgkin's B-Cell Lymphoma  . Papillary thyroid carcinoma ([place])   . Paroxysmal atrial fibrillation ([facility])   . Post-surgical hypothyroidism   . Pulmonary embolus ([place]) 11/2011  . Subarachnoid hemorrhage ([place]) 1996  . Thyroid disease     Past Surgical History:  Procedure Laterality Date  . APPENDECTOMY  1959  . CHOLECYSTECTOMY  1999   S/P Lap Cholecystectomy  . COLON RESECTION  11/12   mucinous adenocarcinoma of the cecum, resected  . COLONOSCOPY  10/05/2011  . COLONOSCOPY N/A 04/17/2014   Procedure: COLONOSCOPY;  Surgeon: Gatha Mayer, MD;  Location: [facility];  Service: Endoscopy;  Laterality: N/A;  . ESOPHAGOGASTRODUODENOSCOPY N/A 04/17/2014   Procedure: ESOPHAGOGASTRODUODENOSCOPY (EGD);  Surgeon: Gatha Mayer, MD;  Location: [facility];  Service: Endoscopy;  Laterality: N/A;  . KNEE ARTHROSCOPY Left 1998   S/P  Arthroscopic Left knee surgery  . ROTATOR CUFF REPAIR Right 1998  . SUBDURAL HEMATOMA EVACUATION VIA CRANIOTOMY    . THYROIDECTOMY  05/23/2012   Procedure: THYROIDECTOMY;  Surgeon: [name], MD;  Location: [facility];  Service: General;  Laterality: N/A;  Total thyroidectomy, central compartment lymph node biopsy  times two, reimplantation left superior parathyroid gland.  . TUBOPLASTY / TUBOTUBAL ANASTOMOSIS      has Non Hodgkin's lymphoma ([place]); Subarachnoid hemorrhage ([place]); Arthritis; Hypertension; Colon cancer ([place]); Pulmonary embolism ([place]); Primary thyroid cancer ([place]); Long term (current) use of anticoagulants-xarelto - afib and hx PE; Anemia, iron deficiency; Heme + stool; Personal history of colon cancer; Personal history of colonic polyps - cancer and adenoma; PAF (paroxysmal atrial fibrillation) ([place]); and Hypersensitivity reaction on her problem list.    has No Known Allergies.    Medication List       Accurate as of 09/29/16  5:21 PM. Always use your most recent med list.          aspirin 81 MG tablet Take 81 mg by mouth daily. Reported on 02/26/2016   citalopram 40 MG tablet Commonly known as:  CELEXA Take 40 mg by mouth daily.   diltiazem 180 MG 24 hr capsule Commonly known as:  DILACOR XR Take 1 capsule (180 mg total) by mouth daily.   diphenhydrAMINE 25 MG tablet Commonly known as:  BENADRYL Take 25 mg by mouth at bedtime as needed. For itching/sleep   losartan 100 MG tablet Commonly known as:  COZAAR Take 100 mg by mouth daily.   SYNTHROID 112 MCG tablet Generic drug:  levothyroxine Take 224 mcg by mouth daily before breakfast.        PHYSICAL EXAMINATION  Oncology Vitals 09/29/2016 09/29/2016  Height - -  Weight - -  Weight (lbs) - -  BMI (kg/m2) - -  Temp 98.8 98.7  Pulse 78 69  Resp 17 17  Resp (Historical as of 07/20/12) - -  SpO2 94 95  BSA (m2) - -   BP Readings from Last 2 Encounters:  09/29/16 (!) 110/48  09/29/16 (!) 104/36    Physical Exam  Constitutional: She is well-developed, well-nourished, and in no distress.  HENT:  Head: Normocephalic and atraumatic.  Eyes: Conjunctivae and EOM are normal. Pupils are equal, round, and reactive to light. Right eye exhibits no discharge. Left eye exhibits no discharge. No scleral icterus.  Neck:  Normal range of motion.  Pulmonary/Chest: Effort normal. No stridor. No respiratory distress.  Musculoskeletal: Normal range of motion.  Neurological: She is alert.  Skin: Skin is warm and dry. There is pallor.  Psychiatric: Affect normal.  Nursing note and vitals reviewed.   LABORATORY DATA:. Appointment on 09/29/2016  Component Date Value Ref Range Status  . WBC 09/29/2016 36.1* 3.9 - 10.3 10e3/uL Final  . NEUT# 09/29/2016 4.0  1.5 - 6.5 10e3/uL Final  . HGB 09/29/2016 13.7  11.6 - 15.9 g/dL Final  . HCT 09/29/2016 43.2  34.8 - 46.6 % Final  . Platelets 09/29/2016 97* 145 - 400 10e3/uL Final  . MCV 09/29/2016 92.9  79.5 - 101.0 fL Final  . MCH 09/29/2016 29.6  25.1 - 34.0 pg Final  . MCHC 09/29/2016 31.8  31.5 - 36.0 g/dL Final  . RBC 09/29/2016 4.65  3.70 - 5.45 10e6/uL Final  . RDW 09/29/2016 14.6* 11.2 - 14.5 % Final  . lymph# 09/29/2016 31.3* 0.9 - 3.3 10e3/uL Final  . MONO# 09/29/2016 0.2  0.1 - 0.9 10e3/uL Final  . Eosinophils Absolute 09/29/2016 0.5  0.0 - 0.5 10e3/uL Final  . Basophils Absolute 09/29/2016 0.1  0.0 - 0.1 10e3/uL Final  . NEUT% 09/29/2016 11.0* 38.4 - 76.8 % Final  . LYMPH% 09/29/2016 86.9* 14.0 - 49.7 % Final  . MONO% 09/29/2016 0.6  0.0 - 14.0 % Final  . EOS% 09/29/2016 1.3  0.0 - 7.0 % Final  . BASO% 09/29/2016 0.2  0.0 - 2.0 % Final  . Sodium 09/29/2016 142  136 - 145 mEq/L Final  . Potassium 09/29/2016 4.6  3.5 - 5.1 mEq/L Final  . Chloride 09/29/2016 109  98 - 109 mEq/L Final  . CO2 09/29/2016 23  22 - 29 mEq/L Final  . Glucose 09/29/2016 106  70 - 140 mg/dl Final  . BUN 09/29/2016 17.7  7.0 - 26.0 mg/dL Final  . Creatinine 09/29/2016 0.8  0.6 - 1.1 mg/dL Final  . Total Bilirubin 09/29/2016 1.08  0.20 - 1.20 mg/dL Final  . Alkaline Phosphatase 09/29/2016 140  40 - 150 U/L Final  . AST 09/29/2016 31  5 - 34 U/L Final  . ALT 09/29/2016 15  0 - 55 U/L Final  . Total Protein 09/29/2016 6.5  6.4 - 8.3 g/dL Final  . Albumin 09/29/2016 3.7  3.5 - 5.0  g/dL Final  . Calcium 09/29/2016 9.2  8.4 - 10.4 mg/dL Final  . Anion Gap 09/29/2016 10  3 - 11 mEq/L Final  . EGFR 09/29/2016 66* >90 ml/min/1.73 m2 Final  . LDH 09/29/2016 457* 125 - 245 U/L Final  . Technologist Review 09/29/2016 Variant lymphs present- some with nucleoli   Final  Hospital Outpatient Visit on 09/28/2016  Component Date Value Ref Range Status  . Glucose-Capillary 09/28/2016 106* 65 - 99 mg/dL Final    RADIOGRAPHIC STUDIES: Nm Pet Image Restag (  ps) Skull Base To Thigh  Result Date: 09/28/2016 CLINICAL DATA:  Subsequent treatment strategy for restaging of non-Hodgkin's lymphoma. EXAM: NUCLEAR MEDICINE PET SKULL BASE TO THIGH TECHNIQUE: 10.5 mCi F-18 FDG was injected intravenously. Full-ring PET imaging was performed from the skull base to thigh after the radiotracer. CT data was obtained and used for attenuation correction and anatomic localization. FASTING BLOOD GLUCOSE:  Value: 106 mg/dl COMPARISON:  Chest abdomen and pelvic CTs of 02/09/2013. PET of 04/03/2012. FINDINGS: NECK Extensive bilateral cervical hypermetabolic adenopathy. An index left-sided level 2-3 node measures 1.4 cm and a S.U.V. max of 5.2 on image 34/series 4. CHEST Bilateral hypermetabolic axillary adenopathy. An index right-sided node measures 1.4 cm and a S.U.V. max of 7.5 on image 63/ series 4.Hypermetabolic intrathoracic nodes, including a prevascular node which measures 1.5 cm and a S.U.V. max of 5.6 on image 63/series 4. ABDOMEN/PELVIS Splenic hypermetabolism and enlargement, highly suspicious for involvement. Splenic activity measures a S.U.V. max of 7.5. Retroperitoneal abdominal hypermetabolic adenopathy, including a left periaortic node which measures 2.2 cm and a S.U.V. max of 7.9 on image 137/series 4. Pelvic side wall hypermetabolic adenopathy bilaterally. Bilateral inguinal hypermetabolic adenopathy. Index right inguinal node measures 1.6 cm and a S.U.V. max of 33.8 on image 205/series 5. SKELETON No  marrow hypermetabolism identified. There is a subcutaneous lesion in the posterior left upper extremity which measures 2.5 cm and a S.U.V. max of 7.9 on image 30/series 4. CT IMAGES PERFORMED FOR ATTENUATION CORRECTION No further findings within the neck. Thyroidectomy. Thoracic aortic atherosclerosis. Mild cardiomegaly. Pulmonary artery enlargement, 3.4 cm outflow tract. Minimal nodularity in the left lower lobe including on image 78/series 4 is felt to be similar. Cholecystectomy. Mild prominence of the common duct is not significantly changed. No hydronephrosis. Abdominal aortic atherosclerosis. Pelvic floor laxity. IMPRESSION: 1. Active lymphoma within the left upper extremity, neck, chest, abdomen, pelvis, as above. 2. Splenic enlargement and hypermetabolism, highly suspicious for splenic involvement. 3. Cardiomegaly. Pulmonary artery enlargement suggests pulmonary arterial hypertension. Electronically Signed   By: Abigail Miyamoto M.D.   On: 09/28/2016 15:57    ASSESSMENT/PLAN:    Non Hodgkin's lymphoma [facility]) Patient presented to the [facility] today to receive her first cycle of Rituxan therapy.  She had received Rituxan approximate 5 years ago; and stated that she had had a hypersensitivity reaction at that time.  She experienced significant rigors during the Rituxan infusion; and the infusion was held per protocol.  Confirmed the patient was given Benadryl as a premedication.  Patient was given Pepcid, Solu-Medrol, and Demerol per protocol.  All symptoms resolved; and patient was able to then proceed with the Rituxan infusion.  However, patient then became nauseous on several different occasions-most likely secondary to the Demerol she had received.  She was then given Ativan 0.5 mg IV and Zofran with fairly good effect.  Patient is slowly increasing the Rituxan infusion as tolerated.  Patient will finish as much of the Rituxan infusion this evening as possible.  The plan of the future will be to have  the patient initiate the Rituxan infusion in the future.  Clear and the day; and planning to call at a much slower rate.  Hopefully, patient will experienced no further difficulties with cycle 2 of the same infusion.  Dr. Benay Spice aware of all, and in agreement with this plan of care.  Hypersensitivity reaction Patient presented to the [facility] today to receive her first cycle of Rituxan therapy.  She had received Rituxan approximate 5  years ago; and stated that she had had a hypersensitivity reaction at that time.  She experienced significant rigors during the Rituxan infusion; and the infusion was held per protocol.  Confirmed the patient was given Benadryl as a premedication.  Patient was given Pepcid, Solu-Medrol, and Demerol per protocol.  All symptoms resolved; and patient was able to then proceed with the Rituxan infusion.  However, patient then became nauseous on several different occasions-most likely secondary to the Demerol she had received.  She was then given Ativan 0.5 mg IV and Zofran with fairly good effect.  Patient is slowly increasing the Rituxan infusion as tolerated.  Patient will finish as much of the Rituxan infusion this evening as possible.  The plan of the future will be to have the patient initiate the Rituxan infusion in the future.  Clear and the day; and planning to call at a much slower rate.  Hopefully, patient will experienced no further difficulties with cycle 2 of the same infusion.  Dr. Benay Spice aware of all, and in agreement with this plan of care.   Patient stated understanding of all instructions; and was in agreement with this plan of care. The patient knows to call the clinic with any problems, questions or concerns.   Total time spent with patient was 40 minutes;  with greater than 75 percent of that time spent in face to face counseling regarding patient's symptoms,  and coordination of care and follow up.  Disclaimer:This dictation was prepared with  Dragon/digital dictation along with [employer]. Any transcriptional errors that result from this process are unintentional.  Drue Second, NP 09/29/2016

## 2016-09-29 NOTE — Progress Notes (Addendum)
[facility] OFFICE PROGRESS NOTE   Diagnosis:  Non-Hodgkin's lymphoma, colon cancer  INTERVAL HISTORY:   Tracy Mclaughlin returns as scheduled. No fevers or sweats. Weight is stable. Appetite unchanged. She denies pain.  Objective:  Vital signs in last 24 hours:  Blood pressure (!) 104/36, pulse 73, temperature 98.3 F (36.8 C), temperature source Oral, resp. rate 18, height 5\' 8"  (1.727 m), weight 212 lb 12.8 oz (96.5 kg), SpO2 97 %.    HEENT: No thrush or ulcers. Lymphatics: 1 cm left low neck/medial supraclavicular lymph node; 1 cm left axillary lymph node; 1 cm left inguinal lymph node; question developing right anterior cervical adenopathy. Resp: Lungs clear bilaterally. Cardio: Regular rate and rhythm. GI: Abdomen soft and nontender. No organomegaly. Vascular: No leg edema.   Lab Results:  Lab Results  Component Value Date   WBC 36.1 (H) 09/29/2016   HGB 13.7 09/29/2016   HCT 43.2 09/29/2016   MCV 92.9 09/29/2016   PLT 97 (L) 09/29/2016   NEUTROABS 4.0 09/29/2016    Imaging:  Nm Pet Image Restag (ps) Skull Base To Thigh  Result Date: 09/28/2016 CLINICAL DATA:  Subsequent treatment strategy for restaging of non-Hodgkin's lymphoma. EXAM: NUCLEAR MEDICINE PET SKULL BASE TO THIGH TECHNIQUE: 10.5 mCi F-18 FDG was injected intravenously. Full-ring PET imaging was performed from the skull base to thigh after the radiotracer. CT data was obtained and used for attenuation correction and anatomic localization. FASTING BLOOD GLUCOSE:  Value: 106 mg/dl COMPARISON:  Chest abdomen and pelvic CTs of 02/09/2013. PET of 04/03/2012. FINDINGS: NECK Extensive bilateral cervical hypermetabolic adenopathy. An index left-sided level 2-3 node measures 1.4 cm and a S.U.V. max of 5.2 on image 34/series 4. CHEST Bilateral hypermetabolic axillary adenopathy. An index right-sided node measures 1.4 cm and a S.U.V. max of 7.5 on image 63/ series 4.Hypermetabolic intrathoracic nodes,  including a prevascular node which measures 1.5 cm and a S.U.V. max of 5.6 on image 63/series 4. ABDOMEN/PELVIS Splenic hypermetabolism and enlargement, highly suspicious for involvement. Splenic activity measures a S.U.V. max of 7.5. Retroperitoneal abdominal hypermetabolic adenopathy, including a left periaortic node which measures 2.2 cm and a S.U.V. max of 7.9 on image 137/series 4. Pelvic side wall hypermetabolic adenopathy bilaterally. Bilateral inguinal hypermetabolic adenopathy. Index right inguinal node measures 1.6 cm and a S.U.V. max of 33.8 on image 205/series 5. SKELETON No marrow hypermetabolism identified. There is a subcutaneous lesion in the posterior left upper extremity which measures 2.5 cm and a S.U.V. max of 7.9 on image 30/series 4. CT IMAGES PERFORMED FOR ATTENUATION CORRECTION No further findings within the neck. Thyroidectomy. Thoracic aortic atherosclerosis. Mild cardiomegaly. Pulmonary artery enlargement, 3.4 cm outflow tract. Minimal nodularity in the left lower lobe including on image 78/series 4 is felt to be similar. Cholecystectomy. Mild prominence of the common duct is not significantly changed. No hydronephrosis. Abdominal aortic atherosclerosis. Pelvic floor laxity. IMPRESSION: 1. Active lymphoma within the left upper extremity, neck, chest, abdomen, pelvis, as above. 2. Splenic enlargement and hypermetabolism, highly suspicious for splenic involvement. 3. Cardiomegaly. Pulmonary artery enlargement suggests pulmonary arterial hypertension. Electronically Signed   By: Abigail Miyamoto M.D.   On: 09/28/2016 15:57    Medications: I have reviewed the patient's current medications.  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 Status post weekly Rituxan x4 03/01/2012 through 03/22/2012. She completed 4 "maintenance "doses of Rituxan, last on 12/19/2012. A  restaging CT on 02/09/2013 showed no  evidence of lymphoma.   Lymph node lateral to the thyroid bed on a neck ultrasound 02/21/2014, status post an FNA biopsy concerning for a lymphoproliferative disorder.  PET scan 09/28/2016 with active lymphoma within the neck, chest, abdomen, pelvis; splenic enlargement and hypermetabolism suspicious for splenic involvement.  Initiation of Rituxan weekly 4 09/29/2016 2. Stage IV (T1bN1b M0) papillary thyroid cancer, status post a thyroidectomy with reimplantation of the left superior parathyroid gland on 05/23/2012, status post radioactive iodine therapy, followed by Dr. Buddy Duty.  3. Stage II (T3 N0) colon cancer, status post a right colectomy 10/19/2011, last colonoscopy April 2015-sigmoid adenoma removed.  4. History of a pulmonary embolism December 2012.  5. History of Atrial fibrillation 6. Iron deficiency anemia-new 03/18/2014. Hemoccult positive stool. The anemia corrected with iron. No longer taking iron.  Status post an upper endoscopy and colonoscopy by Dr. Carlean Purl April 2015 with no bleeding source identified, benign adenoma removed from the sigmoid colon. 7. Report of an upper gastric intestinal bleed fall 2016-managed in [place]. [name] 8. 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 Pruritic rash 07/22/2016    Disposition: Tracy Mclaughlin appears unchanged. We reviewed the PET scan result from yesterday with her at today's visit. She understands the PET scan showed evidence of lymphoma within the neck, chest, abdomen and pelvis as well as splenic involvement. The increased activity in the left arm may be related to a recent influenza injection.  Dr. Benay Spice recommends proceeding with single agent Rituxan weekly 4 day as scheduled. We again reviewed potential toxicities. We discussed the possibility of reactivation of hepatitis B. We will obtain hepatitis B surface antigen and core antibody studies today. She is agreeable to proceed as outlined above.  We will  see her in follow-up in 2 weeks, prior to week 3 Rituxan. She will contact the office in the interim with any problems.  Patient seen with Dr. Benay Spice. 25 minutes were spent face-to-face at today's visit with the majority of that time involved in counseling/coordination of care.  Ned Card ANP/GNP-BC   09/29/2016  10:29 AM This was a shared visit with Ned Card. We reviewed the PET findings Tracy Mclaughlin. She has diffuse hypermetabolic lymphadenopathy with generally maul lymph nodes. She responds single agent rituximab in the past. I think there is a significant chance she can respond to rituximab again.  She will proceed with weekly rituximab beginning today.  Julieanne Manson, M.D.

## 2016-09-30 ENCOUNTER — Ambulatory Visit: Payer: Medicare Other

## 2016-09-30 LAB — HEPATITIS B SURFACE ANTIGEN: HEP B S AG: NEGATIVE

## 2016-09-30 LAB — HEPATITIS B CORE ANTIBODY, TOTAL: HEP B C TOTAL AB: NEGATIVE

## 2016-10-03 ENCOUNTER — Other Ambulatory Visit: Payer: Self-pay | Admitting: Oncology

## 2016-10-05 ENCOUNTER — Telehealth: Payer: Self-pay | Admitting: *Deleted

## 2016-10-05 NOTE — Telephone Encounter (Signed)
Call received from patient stating that she has had a cough since the day before her last treatment and feels as though she has the "flu".  She states that she is expectorating cream colored sputum, is fatigued, denies any fever, pain or SOB and would like to know if she should maintain her appointment tomorrow for treatment.  Dr. Benay Spice notified of above and ordered for patient to cancel lab and infusion appt for tomorrow and to call [facility] with any fevers or SOB.  Call placed back to patient to inform her of Dr. Gearldine Shown orders and to maintain appointment on 10/13/16 as previously scheduled.  Patient verbalized an understanding of MD orders and has no questions at this time.

## 2016-10-06 ENCOUNTER — Ambulatory Visit: Payer: Medicare Other

## 2016-10-06 ENCOUNTER — Other Ambulatory Visit: Payer: Medicare Other

## 2016-10-07 ENCOUNTER — Ambulatory Visit: Payer: Medicare Other | Admitting: Oncology

## 2016-10-07 ENCOUNTER — Other Ambulatory Visit: Payer: Medicare Other

## 2016-10-10 ENCOUNTER — Other Ambulatory Visit: Payer: Self-pay | Admitting: Oncology

## 2016-10-13 ENCOUNTER — Ambulatory Visit (HOSPITAL_BASED_OUTPATIENT_CLINIC_OR_DEPARTMENT_OTHER): Payer: Medicare Other

## 2016-10-13 ENCOUNTER — Telehealth: Payer: Self-pay | Admitting: Oncology

## 2016-10-13 ENCOUNTER — Other Ambulatory Visit (HOSPITAL_BASED_OUTPATIENT_CLINIC_OR_DEPARTMENT_OTHER): Payer: Medicare Other

## 2016-10-13 ENCOUNTER — Ambulatory Visit (HOSPITAL_BASED_OUTPATIENT_CLINIC_OR_DEPARTMENT_OTHER): Payer: Medicare Other | Admitting: Nurse Practitioner

## 2016-10-13 VITALS — BP 149/53 | HR 71 | Temp 98.2°F | Resp 16

## 2016-10-13 VITALS — BP 148/67 | HR 65 | Temp 98.0°F | Resp 18 | Ht 68.0 in | Wt 208.6 lb

## 2016-10-13 DIAGNOSIS — C8267 Cutaneous follicle center lymphoma, spleen: Secondary | ICD-10-CM

## 2016-10-13 DIAGNOSIS — C8261 Cutaneous follicle center lymphoma, lymph nodes of head, face, and neck: Secondary | ICD-10-CM

## 2016-10-13 DIAGNOSIS — Z86711 Personal history of pulmonary embolism: Secondary | ICD-10-CM

## 2016-10-13 DIAGNOSIS — Z5112 Encounter for antineoplastic immunotherapy: Secondary | ICD-10-CM

## 2016-10-13 DIAGNOSIS — C8307 Small cell B-cell lymphoma, spleen: Secondary | ICD-10-CM

## 2016-10-13 DIAGNOSIS — C8510 Unspecified B-cell lymphoma, unspecified site: Secondary | ICD-10-CM

## 2016-10-13 LAB — COMPREHENSIVE METABOLIC PANEL
ALBUMIN: 3.9 g/dL (ref 3.5–5.0)
ALK PHOS: 131 U/L (ref 40–150)
ALT: 21 U/L (ref 0–55)
ANION GAP: 9 meq/L (ref 3–11)
AST: 29 U/L (ref 5–34)
BUN: 21.3 mg/dL (ref 7.0–26.0)
CALCIUM: 9.3 mg/dL (ref 8.4–10.4)
CO2: 23 mEq/L (ref 22–29)
Chloride: 110 mEq/L — ABNORMAL HIGH (ref 98–109)
Creatinine: 1 mg/dL (ref 0.6–1.1)
EGFR: 57 mL/min/{1.73_m2} — AB (ref >90)
Glucose: 120 mg/dl (ref 70–140)
POTASSIUM: 4.7 meq/L (ref 3.5–5.1)
SODIUM: 142 meq/L (ref 136–145)
Total Bilirubin: 0.81 mg/dL (ref 0.20–1.20)
Total Protein: 6.8 g/dL (ref 6.4–8.3)

## 2016-10-13 LAB — CBC WITH DIFFERENTIAL/PLATELET
BASO%: 0.6 % (ref 0.0–2.0)
BASOS ABS: 0.1 10*3/uL (ref 0.0–0.1)
EOS ABS: 0.3 10*3/uL (ref 0.0–0.5)
EOS%: 2 % (ref 0.0–7.0)
HCT: 43.2 % (ref 34.8–46.6)
HEMOGLOBIN: 14 g/dL (ref 11.6–15.9)
LYMPH%: 58.4 % — ABNORMAL HIGH (ref 14.0–49.7)
MCH: 29.5 pg (ref 25.1–34.0)
MCHC: 32.3 g/dL (ref 31.5–36.0)
MCV: 91.4 fL (ref 79.5–101.0)
MONO#: 0.6 10*3/uL (ref 0.1–0.9)
MONO%: 4 % (ref 0.0–14.0)
NEUT#: 5.6 10*3/uL (ref 1.5–6.5)
NEUT%: 35 % — ABNORMAL LOW (ref 38.4–76.8)
PLATELETS: 137 10*3/uL — AB (ref 145–400)
RBC: 4.73 10*6/uL (ref 3.70–5.45)
RDW: 14.5 % (ref 11.2–14.5)
WBC: 16.1 10*3/uL — ABNORMAL HIGH (ref 3.9–10.3)
lymph#: 9.4 10*3/uL — ABNORMAL HIGH (ref 0.9–3.3)

## 2016-10-13 LAB — TECHNOLOGIST REVIEW

## 2016-10-13 LAB — LACTATE DEHYDROGENASE: LDH: 373 U/L — AB (ref 125–245)

## 2016-10-13 MED ORDER — DIPHENHYDRAMINE HCL 25 MG PO CAPS
50.0000 mg | ORAL_CAPSULE | Freq: Once | ORAL | Status: AC
  Administered 2016-10-13: 50 mg via ORAL

## 2016-10-13 MED ORDER — ACETAMINOPHEN 325 MG PO TABS
ORAL_TABLET | ORAL | Status: AC
  Filled 2016-10-13: qty 2

## 2016-10-13 MED ORDER — METHYLPREDNISOLONE SODIUM SUCC 125 MG IJ SOLR
125.0000 mg | Freq: Once | INTRAMUSCULAR | Status: AC
  Administered 2016-10-13: 125 mg via INTRAVENOUS

## 2016-10-13 MED ORDER — SODIUM CHLORIDE 0.9 % IV SOLN
Freq: Once | INTRAVENOUS | Status: AC
Start: 2016-10-13 — End: 2016-10-13
  Administered 2016-10-13: 12:00:00 via INTRAVENOUS

## 2016-10-13 MED ORDER — FAMOTIDINE IN NACL 20-0.9 MG/50ML-% IV SOLN
INTRAVENOUS | Status: AC
  Filled 2016-10-13: qty 50

## 2016-10-13 MED ORDER — SODIUM CHLORIDE 0.9 % IV SOLN
375.0000 mg/m2 | Freq: Once | INTRAVENOUS | Status: AC
  Administered 2016-10-13: 800 mg via INTRAVENOUS
  Filled 2016-10-13: qty 50

## 2016-10-13 MED ORDER — DIPHENHYDRAMINE HCL 25 MG PO CAPS
ORAL_CAPSULE | ORAL | Status: AC
  Filled 2016-10-13: qty 2

## 2016-10-13 MED ORDER — FAMOTIDINE IN NACL 20-0.9 MG/50ML-% IV SOLN
20.0000 mg | Freq: Two times a day (BID) | INTRAVENOUS | Status: DC
  Administered 2016-10-13: 20 mg via INTRAVENOUS

## 2016-10-13 MED ORDER — METHYLPREDNISOLONE SODIUM SUCC 125 MG IJ SOLR
INTRAMUSCULAR | Status: AC
  Filled 2016-10-13: qty 2

## 2016-10-13 MED ORDER — ACETAMINOPHEN 325 MG PO TABS
650.0000 mg | ORAL_TABLET | Freq: Once | ORAL | Status: AC
  Administered 2016-10-13: 650 mg via ORAL

## 2016-10-13 NOTE — Progress Notes (Signed)
[facility] OFFICE PROGRESS NOTE   Diagnosis:  Non-Hodgkin's lymphoma, colon cancer  INTERVAL HISTORY:   Tracy Mclaughlin returns as scheduled. She completed a treatment with Rituxan 09/29/2016. She developed rigors during the infusion. She received medications per the infusion reaction protocol and the infusion was temporarily interrupted. She had an episode of nausea/vomiting. The Rituxan infusion was resumed and completed. Week 2 Rituxan was canceled last week due to concern that she had the "flu".  She reports having a cold last week. Cough is better. She thinks her energy level is better. She also thinks adenopathy is better. She mowed the lawn for 2 hours on Sunday. She has had some back pain since.  Objective:  Vital signs in last 24 hours:  Blood pressure (!) 148/67, pulse 65, temperature 98 F (36.7 C), temperature source Oral, resp. rate 18, height 5\' 8"  (1.727 m), weight 208 lb 9.6 oz (94.6 kg), SpO2 98 %.    HEENT: No thrush or ulcers. Lymphatics: No definite palpable cervical adenopathy. Small, less than 1 cm left axillary lymph node. Resp: Lungs clear bilaterally. Cardio: Regular rate and rhythm. GI: Abdomen soft and nontender. Vascular: No leg edema.   Lab Results:  Lab Results  Component Value Date   WBC 16.1 (H) 10/13/2016   HGB 14.0 10/13/2016   HCT 43.2 10/13/2016   MCV 91.4 10/13/2016   PLT 137 (L) 10/13/2016   NEUTROABS 5.6 10/13/2016    Imaging:  No results found.  Medications: I have reviewed the patient's current medications.  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 Status post weekly Rituxan x4 03/01/2012 through 03/22/2012. She completed 4 "maintenance "doses of Rituxan, last on 12/19/2012. A restaging CT on 02/09/2013 showed no evidence of lymphoma.   Lymph node lateral to the thyroid bed on a neck  ultrasound 02/21/2014, status post an FNA biopsy concerning for a lymphoproliferative disorder.  PET scan 09/28/2016 with active lymphoma within the neck, chest, abdomen, pelvis; splenic enlargement and hypermetabolism suspicious for splenic involvement.  Initiation of Rituxan weekly 4 09/29/2016 2. Stage IV (T1bN1b M0) papillary thyroid cancer, status post a thyroidectomy with reimplantation of the left superior parathyroid gland on 05/23/2012, status post radioactive iodine therapy, followed by Dr. Buddy Mclaughlin.  3. Stage II (T3 N0) colon cancer, status post a right colectomy 10/19/2011, last colonoscopy April 2015-sigmoid adenoma removed.  4. History of a pulmonary embolism December 2012.  5. History of Atrial fibrillation 6. Iron deficiency anemia-new 03/18/2014. Hemoccult positive stool. The anemia corrected with iron. No longer taking iron.  Status post an upper endoscopy and colonoscopy by Tracy Mclaughlin April 2015 with no bleeding source identified, benign adenoma removed from the sigmoid colon. 7. Report of an upper gastric intestinal bleed fall 2016-managed in [place]. [name] 8. 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 Pruritic rash 07/22/2016    Disposition: Tracy Mclaughlin appears stable. She has completed one weekly Rituxan infusion. Treatment was canceled last week due to cold symptoms. The peripheral lymphocytosis and peripheral adenopathy appear improved. The LDH is lower. She has noted improvement in her energy level. Plan to proceed with the second Rituxan infusion today as scheduled. She will return for week 3 Rituxan on 09/20/2016. We will see her in follow-up on 09/28/2016. She will contact the office in the interim with any problems.      Ned Card ANP/GNP-BC   10/13/2016  10:20 AM

## 2016-10-13 NOTE — Patient Instructions (Signed)
[facility] Discharge Instructions for Patients Receiving Chemotherapy  Today you received the following chemotherapy agents Rituximab.   To help prevent nausea and vomiting after your treatment, we encourage you to take your nausea medication as prescribed.   If you develop nausea and vomiting that is not controlled by your nausea medication, call the clinic.   BELOW ARE SYMPTOMS THAT SHOULD BE REPORTED IMMEDIATELY:  *FEVER GREATER THAN 100.5 F  *CHILLS WITH OR WITHOUT FEVER  NAUSEA AND VOMITING THAT IS NOT CONTROLLED WITH YOUR NAUSEA MEDICATION  *UNUSUAL SHORTNESS OF BREATH  *UNUSUAL BRUISING OR BLEEDING  TENDERNESS IN MOUTH AND THROAT WITH OR WITHOUT PRESENCE OF ULCERS  *URINARY PROBLEMS  *BOWEL PROBLEMS  UNUSUAL RASH Items with * indicate a potential emergency and should be followed up as soon as possible.  Feel free to call the clinic you have any questions or concerns. The clinic phone number is [phone].  Please show the CHEMO ALERT CARD at check-in to the Emergency Department and triage nurse.   

## 2016-10-13 NOTE — Telephone Encounter (Signed)
Appointments complete per 10/25 los. Patient aware and will get print out in infusion area.  Per 10/25 los move 11/1 lab/tx to 11/2. Infusion at capacity 11/2 appointments moved to 11/3 - ok per patient - BS desk nurse informed.

## 2016-10-20 ENCOUNTER — Ambulatory Visit: Payer: Medicare Other

## 2016-10-20 ENCOUNTER — Other Ambulatory Visit: Payer: Medicare Other

## 2016-10-21 ENCOUNTER — Other Ambulatory Visit: Payer: Self-pay | Admitting: Oncology

## 2016-10-21 DIAGNOSIS — C8518 Unspecified B-cell lymphoma, lymph nodes of multiple sites: Secondary | ICD-10-CM

## 2016-10-22 ENCOUNTER — Ambulatory Visit (HOSPITAL_BASED_OUTPATIENT_CLINIC_OR_DEPARTMENT_OTHER): Payer: Medicare Other

## 2016-10-22 ENCOUNTER — Other Ambulatory Visit (HOSPITAL_BASED_OUTPATIENT_CLINIC_OR_DEPARTMENT_OTHER): Payer: Medicare Other

## 2016-10-22 VITALS — BP 131/48 | HR 52 | Temp 97.8°F | Resp 18

## 2016-10-22 DIAGNOSIS — Z5112 Encounter for antineoplastic immunotherapy: Secondary | ICD-10-CM | POA: Diagnosis not present

## 2016-10-22 DIAGNOSIS — C8261 Cutaneous follicle center lymphoma, lymph nodes of head, face, and neck: Secondary | ICD-10-CM | POA: Diagnosis not present

## 2016-10-22 DIAGNOSIS — C8267 Cutaneous follicle center lymphoma, spleen: Secondary | ICD-10-CM

## 2016-10-22 DIAGNOSIS — C8518 Unspecified B-cell lymphoma, lymph nodes of multiple sites: Secondary | ICD-10-CM

## 2016-10-22 DIAGNOSIS — C8510 Unspecified B-cell lymphoma, unspecified site: Secondary | ICD-10-CM

## 2016-10-22 LAB — CBC WITH DIFFERENTIAL/PLATELET
BASO%: 0.7 % (ref 0.0–2.0)
Basophils Absolute: 0.1 10*3/uL (ref 0.0–0.1)
EOS%: 0.3 % (ref 0.0–7.0)
Eosinophils Absolute: 0 10*3/uL (ref 0.0–0.5)
HCT: 43.9 % (ref 34.8–46.6)
HGB: 14.2 g/dL (ref 11.6–15.9)
LYMPH%: 27.6 % (ref 14.0–49.7)
MCH: 29.7 pg (ref 25.1–34.0)
MCHC: 32.4 g/dL (ref 31.5–36.0)
MCV: 91.6 fL (ref 79.5–101.0)
MONO#: 0.9 10*3/uL (ref 0.1–0.9)
MONO%: 7.8 % (ref 0.0–14.0)
NEUT%: 63.6 % (ref 38.4–76.8)
NEUTROS ABS: 7.8 10*3/uL — AB (ref 1.5–6.5)
PLATELETS: 172 10*3/uL (ref 145–400)
RBC: 4.79 10*6/uL (ref 3.70–5.45)
RDW: 15.2 % — AB (ref 11.2–14.5)
WBC: 12.2 10*3/uL — AB (ref 3.9–10.3)
lymph#: 3.4 10*3/uL — ABNORMAL HIGH (ref 0.9–3.3)

## 2016-10-22 LAB — COMPREHENSIVE METABOLIC PANEL
ALT: 26 U/L (ref 0–55)
AST: 17 U/L (ref 5–34)
Albumin: 3.9 g/dL (ref 3.5–5.0)
Alkaline Phosphatase: 92 U/L (ref 40–150)
Anion Gap: 10 mEq/L (ref 3–11)
BILIRUBIN TOTAL: 1.02 mg/dL (ref 0.20–1.20)
BUN: 22.4 mg/dL (ref 7.0–26.0)
CO2: 26 meq/L (ref 22–29)
CREATININE: 1 mg/dL (ref 0.6–1.1)
Calcium: 9.2 mg/dL (ref 8.4–10.4)
Chloride: 106 mEq/L (ref 98–109)
EGFR: 57 mL/min/{1.73_m2} — ABNORMAL LOW (ref >90)
GLUCOSE: 103 mg/dL (ref 70–140)
Potassium: 4.4 mEq/L (ref 3.5–5.1)
SODIUM: 142 meq/L (ref 136–145)
TOTAL PROTEIN: 6.7 g/dL (ref 6.4–8.3)

## 2016-10-22 LAB — LACTATE DEHYDROGENASE: LDH: 197 U/L (ref 125–245)

## 2016-10-22 MED ORDER — DIPHENHYDRAMINE HCL 25 MG PO CAPS
ORAL_CAPSULE | ORAL | Status: AC
  Filled 2016-10-22: qty 2

## 2016-10-22 MED ORDER — METHYLPREDNISOLONE SODIUM SUCC 125 MG IJ SOLR
INTRAMUSCULAR | Status: AC
  Filled 2016-10-22: qty 2

## 2016-10-22 MED ORDER — METHYLPREDNISOLONE SODIUM SUCC 125 MG IJ SOLR
125.0000 mg | Freq: Once | INTRAMUSCULAR | Status: AC
  Administered 2016-10-22: 125 mg via INTRAVENOUS

## 2016-10-22 MED ORDER — SODIUM CHLORIDE 0.9 % IV SOLN
Freq: Once | INTRAVENOUS | Status: AC
  Administered 2016-10-22: 12:00:00 via INTRAVENOUS

## 2016-10-22 MED ORDER — FAMOTIDINE IN NACL 20-0.9 MG/50ML-% IV SOLN
20.0000 mg | Freq: Two times a day (BID) | INTRAVENOUS | Status: DC
  Administered 2016-10-22: 20 mg via INTRAVENOUS

## 2016-10-22 MED ORDER — ACETAMINOPHEN 325 MG PO TABS
650.0000 mg | ORAL_TABLET | Freq: Once | ORAL | Status: AC
  Administered 2016-10-22: 650 mg via ORAL

## 2016-10-22 MED ORDER — SODIUM CHLORIDE 0.9 % IV SOLN
375.0000 mg/m2 | Freq: Once | INTRAVENOUS | Status: AC
  Administered 2016-10-22: 800 mg via INTRAVENOUS
  Filled 2016-10-22: qty 50

## 2016-10-22 MED ORDER — ACETAMINOPHEN 325 MG PO TABS
ORAL_TABLET | ORAL | Status: AC
  Filled 2016-10-22: qty 2

## 2016-10-22 MED ORDER — FAMOTIDINE IN NACL 20-0.9 MG/50ML-% IV SOLN
INTRAVENOUS | Status: AC
  Filled 2016-10-22: qty 50

## 2016-10-22 MED ORDER — DIPHENHYDRAMINE HCL 25 MG PO CAPS
50.0000 mg | ORAL_CAPSULE | Freq: Once | ORAL | Status: AC
  Administered 2016-10-22: 50 mg via ORAL

## 2016-10-22 NOTE — Patient Instructions (Signed)
[facility] Discharge Instructions for Patients Receiving Chemotherapy  Today you received the following chemotherapy agents Rituxan To help prevent nausea and vomiting after your treatment, we encourage you to take your nausea medication as prescribed.  If you develop nausea and vomiting that is not controlled by your nausea medication, call the clinic.   BELOW ARE SYMPTOMS THAT SHOULD BE REPORTED IMMEDIATELY:  *FEVER GREATER THAN 100.5 F  *CHILLS WITH OR WITHOUT FEVER  NAUSEA AND VOMITING THAT IS NOT CONTROLLED WITH YOUR NAUSEA MEDICATION  *UNUSUAL SHORTNESS OF BREATH  *UNUSUAL BRUISING OR BLEEDING  TENDERNESS IN MOUTH AND THROAT WITH OR WITHOUT PRESENCE OF ULCERS  *URINARY PROBLEMS  *BOWEL PROBLEMS  UNUSUAL RASH Items with * indicate a potential emergency and should be followed up as soon as possible.  Feel free to call the clinic you have any questions or concerns. The clinic phone number is [phone].  Please show the CHEMO ALERT CARD at check-in to the Emergency Department and triage nurse.   

## 2016-10-23 ENCOUNTER — Other Ambulatory Visit: Payer: Self-pay | Admitting: Oncology

## 2016-10-28 ENCOUNTER — Other Ambulatory Visit: Payer: Self-pay | Admitting: Internal Medicine

## 2016-10-28 DIAGNOSIS — C73 Malignant neoplasm of thyroid gland: Secondary | ICD-10-CM

## 2016-10-29 ENCOUNTER — Other Ambulatory Visit (HOSPITAL_BASED_OUTPATIENT_CLINIC_OR_DEPARTMENT_OTHER): Payer: Medicare Other

## 2016-10-29 ENCOUNTER — Ambulatory Visit (HOSPITAL_BASED_OUTPATIENT_CLINIC_OR_DEPARTMENT_OTHER): Payer: Medicare Other

## 2016-10-29 ENCOUNTER — Ambulatory Visit (HOSPITAL_BASED_OUTPATIENT_CLINIC_OR_DEPARTMENT_OTHER): Payer: Medicare Other | Admitting: Nurse Practitioner

## 2016-10-29 VITALS — BP 122/47 | HR 56 | Temp 98.6°F | Resp 18 | Wt 204.6 lb

## 2016-10-29 VITALS — BP 136/61 | HR 58 | Temp 98.2°F | Resp 18

## 2016-10-29 DIAGNOSIS — C8267 Cutaneous follicle center lymphoma, spleen: Secondary | ICD-10-CM

## 2016-10-29 DIAGNOSIS — C8261 Cutaneous follicle center lymphoma, lymph nodes of head, face, and neck: Secondary | ICD-10-CM

## 2016-10-29 DIAGNOSIS — Z5112 Encounter for antineoplastic immunotherapy: Secondary | ICD-10-CM | POA: Diagnosis not present

## 2016-10-29 DIAGNOSIS — M545 Low back pain: Secondary | ICD-10-CM

## 2016-10-29 DIAGNOSIS — C8510 Unspecified B-cell lymphoma, unspecified site: Secondary | ICD-10-CM

## 2016-10-29 DIAGNOSIS — C8307 Small cell B-cell lymphoma, spleen: Secondary | ICD-10-CM

## 2016-10-29 LAB — LACTATE DEHYDROGENASE: LDH: 161 U/L (ref 125–245)

## 2016-10-29 LAB — COMPREHENSIVE METABOLIC PANEL
ALBUMIN: 3.6 g/dL (ref 3.5–5.0)
ALK PHOS: 87 U/L (ref 40–150)
ALT: 37 U/L (ref 0–55)
AST: 17 U/L (ref 5–34)
Anion Gap: 9 mEq/L (ref 3–11)
BUN: 23.9 mg/dL (ref 7.0–26.0)
CALCIUM: 9.6 mg/dL (ref 8.4–10.4)
CO2: 28 mEq/L (ref 22–29)
Chloride: 103 mEq/L (ref 98–109)
Creatinine: 0.9 mg/dL (ref 0.6–1.1)
EGFR: 63 mL/min/{1.73_m2} — ABNORMAL LOW (ref >90)
Glucose: 102 mg/dl (ref 70–140)
POTASSIUM: 4.2 meq/L (ref 3.5–5.1)
Sodium: 140 mEq/L (ref 136–145)
Total Bilirubin: 1.57 mg/dL — ABNORMAL HIGH (ref 0.20–1.20)
Total Protein: 6.4 g/dL (ref 6.4–8.3)

## 2016-10-29 LAB — CBC WITH DIFFERENTIAL/PLATELET
BASO%: 0.2 % (ref 0.0–2.0)
BASOS ABS: 0 10*3/uL (ref 0.0–0.1)
EOS ABS: 0.3 10*3/uL (ref 0.0–0.5)
EOS%: 2.7 % (ref 0.0–7.0)
HCT: 42.9 % (ref 34.8–46.6)
HEMOGLOBIN: 14.2 g/dL (ref 11.6–15.9)
LYMPH#: 2 10*3/uL (ref 0.9–3.3)
LYMPH%: 21.7 % (ref 14.0–49.7)
MCH: 30.3 pg (ref 25.1–34.0)
MCHC: 33.1 g/dL (ref 31.5–36.0)
MCV: 91.7 fL (ref 79.5–101.0)
MONO#: 0.8 10*3/uL (ref 0.1–0.9)
MONO%: 8.6 % (ref 0.0–14.0)
NEUT#: 6.2 10*3/uL (ref 1.5–6.5)
NEUT%: 66.8 % (ref 38.4–76.8)
Platelets: 144 10*3/uL — ABNORMAL LOW (ref 145–400)
RBC: 4.68 10*6/uL (ref 3.70–5.45)
RDW: 14.9 % — AB (ref 11.2–14.5)
WBC: 9.3 10*3/uL (ref 3.9–10.3)

## 2016-10-29 MED ORDER — ACETAMINOPHEN 325 MG PO TABS
ORAL_TABLET | ORAL | Status: AC
  Filled 2016-10-29: qty 2

## 2016-10-29 MED ORDER — FAMOTIDINE IN NACL 20-0.9 MG/50ML-% IV SOLN
INTRAVENOUS | Status: AC
  Filled 2016-10-29: qty 50

## 2016-10-29 MED ORDER — SODIUM CHLORIDE 0.9 % IV SOLN
375.0000 mg/m2 | Freq: Once | INTRAVENOUS | Status: AC
  Administered 2016-10-29: 800 mg via INTRAVENOUS
  Filled 2016-10-29: qty 50

## 2016-10-29 MED ORDER — METHYLPREDNISOLONE SODIUM SUCC 125 MG IJ SOLR
INTRAMUSCULAR | Status: AC
  Filled 2016-10-29: qty 2

## 2016-10-29 MED ORDER — ACETAMINOPHEN 325 MG PO TABS
650.0000 mg | ORAL_TABLET | Freq: Once | ORAL | Status: AC
  Administered 2016-10-29: 650 mg via ORAL

## 2016-10-29 MED ORDER — DIPHENHYDRAMINE HCL 25 MG PO CAPS
50.0000 mg | ORAL_CAPSULE | Freq: Once | ORAL | Status: AC
  Administered 2016-10-29: 50 mg via ORAL

## 2016-10-29 MED ORDER — DIPHENHYDRAMINE HCL 25 MG PO CAPS
ORAL_CAPSULE | ORAL | Status: AC
  Filled 2016-10-29: qty 2

## 2016-10-29 MED ORDER — SODIUM CHLORIDE 0.9 % IV SOLN
Freq: Once | INTRAVENOUS | Status: AC
  Administered 2016-10-29: 12:00:00 via INTRAVENOUS

## 2016-10-29 MED ORDER — FAMOTIDINE IN NACL 20-0.9 MG/50ML-% IV SOLN
20.0000 mg | Freq: Two times a day (BID) | INTRAVENOUS | Status: DC
  Administered 2016-10-29: 20 mg via INTRAVENOUS

## 2016-10-29 MED ORDER — METHYLPREDNISOLONE SODIUM SUCC 125 MG IJ SOLR
125.0000 mg | Freq: Once | INTRAMUSCULAR | Status: AC
  Administered 2016-10-29: 125 mg via INTRAVENOUS

## 2016-10-29 NOTE — Progress Notes (Signed)
Per Ned Card, NP okay to treat with total bilirubin of 1.57

## 2016-10-29 NOTE — Patient Instructions (Signed)
[facility] Discharge Instructions for Patients Receiving Chemotherapy  Today you received the following chemotherapy agents: Rituxan   To help prevent nausea and vomiting after your treatment, we encourage you to take your nausea medication as directed.    If you develop nausea and vomiting that is not controlled by your nausea medication, call the clinic.   BELOW ARE SYMPTOMS THAT SHOULD BE REPORTED IMMEDIATELY:  *FEVER GREATER THAN 100.5 F  *CHILLS WITH OR WITHOUT FEVER  NAUSEA AND VOMITING THAT IS NOT CONTROLLED WITH YOUR NAUSEA MEDICATION  *UNUSUAL SHORTNESS OF BREATH  *UNUSUAL BRUISING OR BLEEDING  TENDERNESS IN MOUTH AND THROAT WITH OR WITHOUT PRESENCE OF ULCERS  *URINARY PROBLEMS  *BOWEL PROBLEMS  UNUSUAL RASH Items with * indicate a potential emergency and should be followed up as soon as possible.  Feel free to call the clinic you have any questions or concerns. The clinic phone number is [phone].  Please show the CHEMO ALERT CARD at check-in to the Emergency Department and triage nurse.   

## 2016-10-29 NOTE — Progress Notes (Signed)
  [facility] OFFICE PROGRESS NOTE   Diagnosis:  Non-Hodgkin's lymphoma, colon cancer  INTERVAL HISTORY:   Tracy Mclaughlin returns as scheduled. She completed week 3 Rituxan 10/22/2016. She reports tolerating the Rituxan well with no signs of allergic or infusion reaction. No fevers or sweats. Appetite remains poor. She continues to have low back pain radiating into the right leg. The pain is improving. No bowel or bladder dysfunction. She takes Tylenol once a day with good relief. She reports being evaluated in the emergency department on 2 occasions for the back pain with a diagnosis of degenerative disc disease and sciatica.  Objective:  Vital signs in last 24 hours:  Blood pressure (!) 122/47, pulse (!) 56, temperature 98.6 F (37 C), temperature source Oral, resp. rate 18, weight 204 lb 9.6 oz (92.8 kg), SpO2 98 %.    HEENT: No thrush or ulcers. Lymphatics: No palpable cervical, supraclavicular or left axillary lymph nodes. Resp: Lungs clear bilaterally. Cardio: Regular rate and rhythm.  GI: Abdomen soft and nontender. No organomegaly. Vascular: No leg edema. Neuro: Lower extremity motor strength 5 over 5.  Lab Results:  Lab Results  Component Value Date   WBC 9.3 10/29/2016   HGB 14.2 10/29/2016   HCT 42.9 10/29/2016   MCV 91.7 10/29/2016   PLT 144 (L) 10/29/2016   NEUTROABS 6.2 10/29/2016    Imaging:  No results found.  Medications: I have reviewed the patient's current medications.  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 Status post weekly Rituxan x4 03/01/2012 through 03/22/2012. She completed 4 "maintenance "doses of Rituxan, last on 12/19/2012. A restaging CT on 02/09/2013 showed no evidence of lymphoma.   Lymph node lateral to the thyroid bed on a neck ultrasound 02/21/2014, status post an FNA biopsy concerning for a  lymphoproliferative disorder.  PET scan 09/28/2016 with active lymphoma within the neck, chest, abdomen, pelvis; splenic enlargement and hypermetabolism suspicious for splenic involvement.  Initiation of Rituxan weekly 4 09/29/2016 2. Stage IV (T1bN1b M0) papillary thyroid cancer, status post a thyroidectomy with reimplantation of the left superior parathyroid gland on 05/23/2012, status post radioactive iodine therapy, followed by Dr. Buddy Duty.  3. Stage II (T3 N0) colon cancer, status post a right colectomy 10/19/2011, last colonoscopy April 2015-sigmoid adenoma removed.  4. History of a pulmonary embolism December 2012.  5. History of Atrial fibrillation 6. Iron deficiency anemia-new 03/18/2014. Hemoccult positive stool. The anemia corrected with iron. No longer taking iron.  Status post an upper endoscopy and colonoscopy by Dr. Carlean Purl April 2015 with no bleeding source identified, benign adenoma removed from the sigmoid colon. 7. Report of an upper gastric intestinal bleed fall 2016-managed in [place]. [name] 8. 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 Pruritic rash 07/22/2016    Disposition: Tracy Mclaughlin appears stable. She has completed 3 weekly treatments with Rituxan. The peripheral lymphocytosis and peripheral adenopathy have resolved. The LDH has normalized. Plan to proceed with the fourth and final weekly treatment with Rituxan today as scheduled. She will return for a follow-up visit and initiation of maintenance Rituxan in 2 months. She will contact the office in the interim with any problems. I encouraged her to follow-up with her PCP regarding the back pain.  Plan reviewed with Dr. Benay Spice.    Tracy Mclaughlin ANP/GNP-BC   10/29/2016  10:56 AM

## 2016-11-05 ENCOUNTER — Ambulatory Visit (HOSPITAL_COMMUNITY)
Admission: RE | Admit: 2016-11-05 | Discharge: 2016-11-05 | Disposition: A | Discharge status: Home / Self Care | Payer: Medicare Other | Source: Ambulatory Visit | Attending: Internal Medicine | Admitting: Internal Medicine

## 2016-11-05 DIAGNOSIS — E890 Postprocedural hypothyroidism: Secondary | ICD-10-CM | POA: Insufficient documentation

## 2016-11-05 DIAGNOSIS — C73 Malignant neoplasm of thyroid gland: Secondary | ICD-10-CM | POA: Diagnosis not present

## 2016-11-08 ENCOUNTER — Telehealth: Payer: Self-pay | Admitting: General Practice

## 2016-11-08 NOTE — Telephone Encounter (Signed)
Spoke with patient confirmed January 2018 appts.

## 2016-12-23 ENCOUNTER — Ambulatory Visit (HOSPITAL_BASED_OUTPATIENT_CLINIC_OR_DEPARTMENT_OTHER): Payer: Medicare Other

## 2016-12-23 ENCOUNTER — Other Ambulatory Visit (HOSPITAL_BASED_OUTPATIENT_CLINIC_OR_DEPARTMENT_OTHER): Payer: Medicare Other

## 2016-12-23 ENCOUNTER — Ambulatory Visit (HOSPITAL_BASED_OUTPATIENT_CLINIC_OR_DEPARTMENT_OTHER): Payer: Medicare Other | Admitting: Oncology

## 2016-12-23 VITALS — BP 133/49 | HR 79 | Temp 98.4°F | Resp 16

## 2016-12-23 VITALS — BP 145/47 | HR 72 | Temp 97.8°F | Resp 17 | Ht 68.0 in | Wt 204.8 lb

## 2016-12-23 DIAGNOSIS — C8261 Cutaneous follicle center lymphoma, lymph nodes of head, face, and neck: Secondary | ICD-10-CM | POA: Diagnosis not present

## 2016-12-23 DIAGNOSIS — C8307 Small cell B-cell lymphoma, spleen: Secondary | ICD-10-CM

## 2016-12-23 DIAGNOSIS — C8267 Cutaneous follicle center lymphoma, spleen: Secondary | ICD-10-CM

## 2016-12-23 DIAGNOSIS — I4891 Unspecified atrial fibrillation: Secondary | ICD-10-CM

## 2016-12-23 DIAGNOSIS — C8510 Unspecified B-cell lymphoma, unspecified site: Secondary | ICD-10-CM

## 2016-12-23 DIAGNOSIS — Z86711 Personal history of pulmonary embolism: Secondary | ICD-10-CM

## 2016-12-23 DIAGNOSIS — Z5112 Encounter for antineoplastic immunotherapy: Secondary | ICD-10-CM | POA: Diagnosis not present

## 2016-12-23 DIAGNOSIS — C8518 Unspecified B-cell lymphoma, lymph nodes of multiple sites: Secondary | ICD-10-CM

## 2016-12-23 LAB — CBC WITH DIFFERENTIAL/PLATELET
BASO%: 0.5 % (ref 0.0–2.0)
BASOS ABS: 0 10*3/uL (ref 0.0–0.1)
EOS ABS: 0.4 10*3/uL (ref 0.0–0.5)
EOS%: 6.6 % (ref 0.0–7.0)
HEMATOCRIT: 43.9 % (ref 34.8–46.6)
HEMOGLOBIN: 15.2 g/dL (ref 11.6–15.9)
LYMPH#: 2 10*3/uL (ref 0.9–3.3)
LYMPH%: 31.2 % (ref 14.0–49.7)
MCH: 32.7 pg (ref 25.1–34.0)
MCHC: 34.5 g/dL (ref 31.5–36.0)
MCV: 94.8 fL (ref 79.5–101.0)
MONO#: 0.5 10*3/uL (ref 0.1–0.9)
MONO%: 8.2 % (ref 0.0–14.0)
NEUT#: 3.4 10*3/uL (ref 1.5–6.5)
NEUT%: 53.5 % (ref 38.4–76.8)
PLATELETS: 163 10*3/uL (ref 145–400)
RBC: 4.63 10*6/uL (ref 3.70–5.45)
RDW: 15.7 % — ABNORMAL HIGH (ref 11.2–14.5)
WBC: 6.4 10*3/uL (ref 3.9–10.3)

## 2016-12-23 LAB — COMPREHENSIVE METABOLIC PANEL
ALBUMIN: 4.1 g/dL (ref 3.5–5.0)
ALK PHOS: 90 U/L (ref 40–150)
ALT: 18 U/L (ref 0–55)
ANION GAP: 10 meq/L (ref 3–11)
AST: 26 U/L (ref 5–34)
BUN: 18.5 mg/dL (ref 7.0–26.0)
CALCIUM: 9.7 mg/dL (ref 8.4–10.4)
CHLORIDE: 107 meq/L (ref 98–109)
CO2: 24 mEq/L (ref 22–29)
CREATININE: 0.8 mg/dL (ref 0.6–1.1)
EGFR: 69 mL/min/{1.73_m2} — ABNORMAL LOW (ref >90)
Glucose: 110 mg/dl (ref 70–140)
POTASSIUM: 4.5 meq/L (ref 3.5–5.1)
Sodium: 141 mEq/L (ref 136–145)
Total Bilirubin: 1.05 mg/dL (ref 0.20–1.20)
Total Protein: 6.7 g/dL (ref 6.4–8.3)

## 2016-12-23 LAB — LACTATE DEHYDROGENASE: LDH: 265 U/L — AB (ref 125–245)

## 2016-12-23 MED ORDER — ACETAMINOPHEN 325 MG PO TABS
650.0000 mg | ORAL_TABLET | Freq: Once | ORAL | Status: AC
  Administered 2016-12-23: 650 mg via ORAL

## 2016-12-23 MED ORDER — DIPHENHYDRAMINE HCL 25 MG PO CAPS
50.0000 mg | ORAL_CAPSULE | Freq: Once | ORAL | Status: AC
  Administered 2016-12-23: 50 mg via ORAL

## 2016-12-23 MED ORDER — FAMOTIDINE IN NACL 20-0.9 MG/50ML-% IV SOLN
INTRAVENOUS | Status: AC
  Filled 2016-12-23: qty 50

## 2016-12-23 MED ORDER — FAMOTIDINE IN NACL 20-0.9 MG/50ML-% IV SOLN
20.0000 mg | Freq: Two times a day (BID) | INTRAVENOUS | Status: DC
  Administered 2016-12-23: 20 mg via INTRAVENOUS

## 2016-12-23 MED ORDER — METHYLPREDNISOLONE SODIUM SUCC 125 MG IJ SOLR
INTRAMUSCULAR | Status: AC
Start: 2016-12-23 — End: 2016-12-23
  Filled 2016-12-23: qty 2

## 2016-12-23 MED ORDER — ACETAMINOPHEN 325 MG PO TABS
ORAL_TABLET | ORAL | Status: AC
  Filled 2016-12-23: qty 2

## 2016-12-23 MED ORDER — RITUXIMAB CHEMO INJECTION 500 MG/50ML
375.0000 mg/m2 | Freq: Once | INTRAVENOUS | Status: AC
  Administered 2016-12-23: 800 mg via INTRAVENOUS
  Filled 2016-12-23: qty 50

## 2016-12-23 MED ORDER — SODIUM CHLORIDE 0.9 % IV SOLN
Freq: Once | INTRAVENOUS | Status: AC
  Administered 2016-12-23: 11:00:00 via INTRAVENOUS

## 2016-12-23 MED ORDER — METHYLPREDNISOLONE SODIUM SUCC 125 MG IJ SOLR
125.0000 mg | Freq: Once | INTRAMUSCULAR | Status: AC
  Administered 2016-12-23: 125 mg via INTRAVENOUS

## 2016-12-23 MED ORDER — DIPHENHYDRAMINE HCL 25 MG PO CAPS
ORAL_CAPSULE | ORAL | Status: AC
  Filled 2016-12-23: qty 2

## 2016-12-23 NOTE — Progress Notes (Signed)
  [facility] OFFICE PROGRESS NOTE   Diagnosis: Non-Hodgkin's lymphoma  INTERVAL HISTORY:   Tracy Mclaughlin returns as scheduled. She feels well. No fever or night sweats. Good appetite. She has noted a fullness at the posterior left neck. No palpable lymph nodes.  Objective:  Vital signs in last 24 hours:  Blood pressure (!) 145/47, pulse 72, temperature 97.8 F (36.6 C), temperature source Oral, resp. rate 17, height 5\' 8"  (1.727 m), weight 204 lb 12.8 oz (92.9 kg), SpO2 97 %.    HEENT: Neck without mass, slight soft tissue fullness at the left upper posterior neck compared to the right side. No discrete mass. Lymphatics: No cervical, supraclavicular, axillary, or inguinal nodes Resp: Lungs clear bilaterally Cardio: Regular rate and rhythm GI: No hepatosplenomegaly, no mass Vascular: No leg edema   Lab Results:  Lab Results  Component Value Date   WBC 6.4 12/23/2016   HGB 15.2 12/23/2016   HCT 43.9 12/23/2016   MCV 94.8 12/23/2016   PLT 163 12/23/2016   NEUTROABS 3.4 12/23/2016     Medications: I have reviewed the patient's current medications.  Assessment/Plan: 1. 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 Status post weekly Rituxan x4 03/01/2012 through 03/22/2012. She completed 4 "maintenance "doses of Rituxan, last on 12/19/2012. A restaging CT on 02/09/2013 showed no evidence of lymphoma.   Lymph node lateral to the thyroid bed on a neck ultrasound 02/21/2014, status post an FNA biopsy concerning for a lymphoproliferative disorder.  PET scan 09/28/2016 with active lymphoma within the neck, chest, abdomen, pelvis; splenic enlargement and hypermetabolism suspicious for splenic involvement.  Initiation of Rituxan weekly 4 09/29/2016  Initiation of maintenance Rituxan on a 3 month schedule 12/23/2016 2. Stage IV (T1bN1b M0) papillary thyroid cancer,  status post a thyroidectomy with reimplantation of the left superior parathyroid gland on 05/23/2012, status post radioactive iodine therapy, followed by Dr. Buddy Duty.  3. Stage II (T3 N0) colon cancer, status post a right colectomy 10/19/2011, last colonoscopy April 2015-sigmoid adenoma removed.  4. History of a pulmonary embolism December 2012.  5. History of Atrial fibrillation 6. Iron deficiency anemia-new 03/18/2014. Hemoccult positive stool. The anemia corrected with iron. No longer taking iron.  Status post an upper endoscopy and colonoscopy by Dr. Carlean Purl April 2015 with no bleeding source identified, benign adenoma removed from the sigmoid colon. 7. Report of an upper gastric intestinal bleed fall 2016-managed in [place]. [name] 8. 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 Pruritic rash 07/22/2016     Disposition:  Tracy Mclaughlin has experienced clinical improvement with rituximab. The peripheral lymphocytosis has resolved. The slight fullness at the left posterior neck is likely a benign finding. The plan is to proceed with maintenance rituximab today. She will return for an office visit and rituximab in 3 months.  15 minutes were spent with patient today.  Betsy Coder, MD  12/23/2016  4:29 PM

## 2016-12-23 NOTE — Patient Instructions (Signed)
[facility] Discharge Instructions for Patients Receiving Chemotherapy  Today you received the following chemotherapy agents: Rituxan   To help prevent nausea and vomiting after your treatment, we encourage you to take your nausea medication as directed.    If you develop nausea and vomiting that is not controlled by your nausea medication, call the clinic.   BELOW ARE SYMPTOMS THAT SHOULD BE REPORTED IMMEDIATELY:  *FEVER GREATER THAN 100.5 F  *CHILLS WITH OR WITHOUT FEVER  NAUSEA AND VOMITING THAT IS NOT CONTROLLED WITH YOUR NAUSEA MEDICATION  *UNUSUAL SHORTNESS OF BREATH  *UNUSUAL BRUISING OR BLEEDING  TENDERNESS IN MOUTH AND THROAT WITH OR WITHOUT PRESENCE OF ULCERS  *URINARY PROBLEMS  *BOWEL PROBLEMS  UNUSUAL RASH Items with * indicate a potential emergency and should be followed up as soon as possible.  Feel free to call the clinic you have any questions or concerns. The clinic phone number is [phone].  Please show the CHEMO ALERT CARD at check-in to the Emergency Department and triage nurse.   

## 2016-12-24 ENCOUNTER — Other Ambulatory Visit: Payer: Self-pay | Admitting: Nurse Practitioner

## 2016-12-24 DIAGNOSIS — C8518 Unspecified B-cell lymphoma, lymph nodes of multiple sites: Secondary | ICD-10-CM

## 2016-12-28 ENCOUNTER — Telehealth: Payer: Self-pay | Admitting: Nurse Practitioner

## 2016-12-28 NOTE — Telephone Encounter (Signed)
Left a message for patient to call back to confirm appointment

## 2017-03-17 ENCOUNTER — Ambulatory Visit (HOSPITAL_BASED_OUTPATIENT_CLINIC_OR_DEPARTMENT_OTHER): Payer: Medicare Other | Admitting: Nurse Practitioner

## 2017-03-17 ENCOUNTER — Other Ambulatory Visit (HOSPITAL_BASED_OUTPATIENT_CLINIC_OR_DEPARTMENT_OTHER): Payer: Medicare Other

## 2017-03-17 ENCOUNTER — Telehealth: Payer: Self-pay | Admitting: Nurse Practitioner

## 2017-03-17 ENCOUNTER — Ambulatory Visit (HOSPITAL_BASED_OUTPATIENT_CLINIC_OR_DEPARTMENT_OTHER): Payer: Medicare Other

## 2017-03-17 VITALS — BP 133/43 | HR 53 | Temp 98.2°F | Resp 17 | Ht 68.0 in | Wt 207.9 lb

## 2017-03-17 VITALS — BP 124/58 | HR 73 | Temp 98.0°F | Resp 20

## 2017-03-17 DIAGNOSIS — C8518 Unspecified B-cell lymphoma, lymph nodes of multiple sites: Secondary | ICD-10-CM

## 2017-03-17 DIAGNOSIS — C8261 Cutaneous follicle center lymphoma, lymph nodes of head, face, and neck: Secondary | ICD-10-CM

## 2017-03-17 DIAGNOSIS — C8267 Cutaneous follicle center lymphoma, spleen: Secondary | ICD-10-CM | POA: Diagnosis not present

## 2017-03-17 DIAGNOSIS — Z86711 Personal history of pulmonary embolism: Secondary | ICD-10-CM | POA: Diagnosis not present

## 2017-03-17 DIAGNOSIS — Z5112 Encounter for antineoplastic immunotherapy: Secondary | ICD-10-CM | POA: Diagnosis not present

## 2017-03-17 DIAGNOSIS — C8510 Unspecified B-cell lymphoma, unspecified site: Secondary | ICD-10-CM

## 2017-03-17 LAB — CBC WITH DIFFERENTIAL/PLATELET
BASO%: 0.9 % (ref 0.0–2.0)
BASOS ABS: 0.1 10*3/uL (ref 0.0–0.1)
EOS ABS: 0.4 10*3/uL (ref 0.0–0.5)
EOS%: 5.8 % (ref 0.0–7.0)
HEMATOCRIT: 44 % (ref 34.8–46.6)
HEMOGLOBIN: 15.2 g/dL (ref 11.6–15.9)
LYMPH#: 2.1 10*3/uL (ref 0.9–3.3)
LYMPH%: 32.9 % (ref 14.0–49.7)
MCH: 33.2 pg (ref 25.1–34.0)
MCHC: 34.5 g/dL (ref 31.5–36.0)
MCV: 96.1 fL (ref 79.5–101.0)
MONO#: 0.6 10*3/uL (ref 0.1–0.9)
MONO%: 9.4 % (ref 0.0–14.0)
NEUT#: 3.2 10*3/uL (ref 1.5–6.5)
NEUT%: 51 % (ref 38.4–76.8)
PLATELETS: 145 10*3/uL (ref 145–400)
RBC: 4.58 10*6/uL (ref 3.70–5.45)
RDW: 13 % (ref 11.2–14.5)
WBC: 6.4 10*3/uL (ref 3.9–10.3)

## 2017-03-17 LAB — LACTATE DEHYDROGENASE: LDH: 197 U/L (ref 125–245)

## 2017-03-17 MED ORDER — ACETAMINOPHEN 325 MG PO TABS
ORAL_TABLET | ORAL | Status: AC
  Filled 2017-03-17: qty 2

## 2017-03-17 MED ORDER — FAMOTIDINE IN NACL 20-0.9 MG/50ML-% IV SOLN
INTRAVENOUS | Status: AC
  Filled 2017-03-17: qty 50

## 2017-03-17 MED ORDER — SODIUM CHLORIDE 0.9 % IV SOLN
Freq: Once | INTRAVENOUS | Status: AC
  Administered 2017-03-17: 12:00:00 via INTRAVENOUS

## 2017-03-17 MED ORDER — DIPHENHYDRAMINE HCL 25 MG PO CAPS
50.0000 mg | ORAL_CAPSULE | Freq: Once | ORAL | Status: AC
  Administered 2017-03-17: 50 mg via ORAL

## 2017-03-17 MED ORDER — METHYLPREDNISOLONE SODIUM SUCC 125 MG IJ SOLR
125.0000 mg | Freq: Once | INTRAMUSCULAR | Status: AC
  Administered 2017-03-17: 125 mg via INTRAVENOUS

## 2017-03-17 MED ORDER — FAMOTIDINE IN NACL 20-0.9 MG/50ML-% IV SOLN
20.0000 mg | Freq: Two times a day (BID) | INTRAVENOUS | Status: DC
  Administered 2017-03-17: 20 mg via INTRAVENOUS

## 2017-03-17 MED ORDER — ACETAMINOPHEN 325 MG PO TABS
650.0000 mg | ORAL_TABLET | Freq: Once | ORAL | Status: AC
  Administered 2017-03-17: 650 mg via ORAL

## 2017-03-17 MED ORDER — SODIUM CHLORIDE 0.9 % IV SOLN
375.0000 mg/m2 | Freq: Once | INTRAVENOUS | Status: AC
  Administered 2017-03-17: 800 mg via INTRAVENOUS
  Filled 2017-03-17: qty 50

## 2017-03-17 MED ORDER — DIPHENHYDRAMINE HCL 25 MG PO CAPS
ORAL_CAPSULE | ORAL | Status: AC
  Filled 2017-03-17: qty 2

## 2017-03-17 MED ORDER — METHYLPREDNISOLONE SODIUM SUCC 125 MG IJ SOLR
INTRAMUSCULAR | Status: AC
  Filled 2017-03-17: qty 2

## 2017-03-17 NOTE — Addendum Note (Signed)
Addended by: Betsy Coder B on: 03/17/2017 11:51 AM   Modules accepted: Orders

## 2017-03-17 NOTE — Progress Notes (Signed)
  [facility] OFFICE PROGRESS NOTE   Diagnosis:  Non-Hodgkin's lymphoma  INTERVAL HISTORY:   Ms. Simonich returns as scheduled. She began every 3 month maintenance Rituxan 12/23/2016. She feels well. No fevers or sweats. No enlarged lymph nodes. She has a good appetite. No interim illnesses or infections except for a minor "cold".  Objective:  Vital signs in last 24 hours:  Blood pressure (!) 133/43, pulse (!) 53, temperature 98.2 F (36.8 C), temperature source Oral, resp. rate 17, height 5\' 8"  (1.727 m), weight 207 lb 14.4 oz (94.3 kg), SpO2 98 %.    HEENT: No thrush or ulcers. Lymphatics: No palpable cervical, supra clavicular or axillary lymph nodes. Resp: Lungs clear bilaterally. Cardio: Regular rate and rhythm. GI: Abdomen soft and nontender. No organomegaly. Vascular: No leg edema.    Lab Results:  Lab Results  Component Value Date   WBC 6.4 03/17/2017   HGB 15.2 03/17/2017   HCT 44.0 03/17/2017   MCV 96.1 03/17/2017   PLT 145 03/17/2017   NEUTROABS 3.2 03/17/2017   LDH 197 Imaging:  No results found.  Medications: I have reviewed the patient's current medications.  Assessment/Plan: 1. 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 Status post weekly Rituxan x4 03/01/2012 through 03/22/2012. She completed 4 "maintenance "doses of Rituxan, last on 12/19/2012. A restaging CT on 02/09/2013 showed no evidence of lymphoma.   Lymph node lateral to the thyroid bed on a neck ultrasound 02/21/2014, status post an FNA biopsy concerning for a lymphoproliferative disorder.  PET scan 09/28/2016 with active lymphoma within the neck, chest, abdomen, pelvis; splenic enlargement and hypermetabolism suspicious for splenic involvement.  Initiation of Rituxan weekly 4 09/29/2016  Initiation of maintenance Rituxan on a 3 month schedule 12/23/2016 2. Stage IV  (T1bN1b M0) papillary thyroid cancer, status post a thyroidectomy with reimplantation of the left superior parathyroid gland on 05/23/2012, status post radioactive iodine therapy, followed by Dr. Buddy Duty.  3. Stage II (T3 N0) colon cancer, status post a right colectomy 10/19/2011, last colonoscopy April 2015-sigmoid adenoma removed.  4. History of a pulmonary embolism December 2012.  5. History of Atrial fibrillation 6. Iron deficiency anemia-new 03/18/2014. Hemoccult positive stool. The anemia corrected with iron. No longer taking iron.  Status post an upper endoscopy and colonoscopy by Dr. Carlean Purl April 2015 with no bleeding source identified, benign adenoma removed from the sigmoid colon. 7. Report of an upper gastric intestinal bleed fall 2016-managed in [place]. [name] 8. 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 Pruritic rash 07/22/2016    Disposition: Tracy Mclaughlin appears stable. Plan to proceed with maintenance Rituxan today as scheduled. She will return for a follow-up visit and Rituxan in 3 months. She will contact the office in the interim with any problems.    Ned Card ANP/GNP-BC   03/17/2017  10:00 AM

## 2017-03-17 NOTE — Patient Instructions (Signed)
[facility] Discharge Instructions for Patients Receiving Chemotherapy  Today you received the following chemotherapy agents: Rituxan   To help prevent nausea and vomiting after your treatment, we encourage you to take your nausea medication as directed.    If you develop nausea and vomiting that is not controlled by your nausea medication, call the clinic.   BELOW ARE SYMPTOMS THAT SHOULD BE REPORTED IMMEDIATELY:  *FEVER GREATER THAN 100.5 F  *CHILLS WITH OR WITHOUT FEVER  NAUSEA AND VOMITING THAT IS NOT CONTROLLED WITH YOUR NAUSEA MEDICATION  *UNUSUAL SHORTNESS OF BREATH  *UNUSUAL BRUISING OR BLEEDING  TENDERNESS IN MOUTH AND THROAT WITH OR WITHOUT PRESENCE OF ULCERS  *URINARY PROBLEMS  *BOWEL PROBLEMS  UNUSUAL RASH Items with * indicate a potential emergency and should be followed up as soon as possible.  Feel free to call the clinic you have any questions or concerns. The clinic phone number is [phone].  Please show the CHEMO ALERT CARD at check-in to the Emergency Department and triage nurse.   

## 2017-03-17 NOTE — Telephone Encounter (Signed)
Appointments scheduled per 3.29.18 LOS. Patient given AVS report and calendars with future scheduled appointments. °

## 2017-05-04 ENCOUNTER — Telehealth: Payer: Self-pay | Admitting: *Deleted

## 2017-05-04 NOTE — Telephone Encounter (Signed)
Sent to requesting office 

## 2017-05-04 NOTE — Telephone Encounter (Signed)
Request for surgical clearance:  1. What type of surgery is being performed? Left Knee, Quadriceps Tendon Repair   2. When is this surgery scheduled?  05/11/17  3. Are there any medications that need to be held prior to surgery and how long? ASA, if needed.   4. Name of physician performing surgery?  Dr. Encarnacion Slates   5. What is your office phone and fax number?  [place] with [facility]

## 2017-05-04 NOTE — Telephone Encounter (Signed)
Okay to clear for surgery.

## 2017-06-05 ENCOUNTER — Other Ambulatory Visit: Payer: Self-pay | Admitting: Oncology

## 2017-06-09 ENCOUNTER — Other Ambulatory Visit (HOSPITAL_BASED_OUTPATIENT_CLINIC_OR_DEPARTMENT_OTHER): Payer: Medicare Other

## 2017-06-09 ENCOUNTER — Ambulatory Visit (HOSPITAL_BASED_OUTPATIENT_CLINIC_OR_DEPARTMENT_OTHER): Payer: Medicare Other | Admitting: Oncology

## 2017-06-09 ENCOUNTER — Ambulatory Visit (HOSPITAL_BASED_OUTPATIENT_CLINIC_OR_DEPARTMENT_OTHER): Payer: Medicare Other

## 2017-06-09 VITALS — BP 113/47 | HR 60 | Temp 97.9°F | Resp 17

## 2017-06-09 VITALS — BP 140/45 | HR 80 | Temp 98.3°F | Resp 18 | Ht 68.0 in | Wt 204.2 lb

## 2017-06-09 DIAGNOSIS — Z5112 Encounter for antineoplastic immunotherapy: Secondary | ICD-10-CM | POA: Diagnosis not present

## 2017-06-09 DIAGNOSIS — C8267 Cutaneous follicle center lymphoma, spleen: Secondary | ICD-10-CM | POA: Diagnosis not present

## 2017-06-09 DIAGNOSIS — Z85038 Personal history of other malignant neoplasm of large intestine: Secondary | ICD-10-CM

## 2017-06-09 DIAGNOSIS — C8261 Cutaneous follicle center lymphoma, lymph nodes of head, face, and neck: Secondary | ICD-10-CM

## 2017-06-09 DIAGNOSIS — Z86711 Personal history of pulmonary embolism: Secondary | ICD-10-CM | POA: Diagnosis not present

## 2017-06-09 DIAGNOSIS — I4891 Unspecified atrial fibrillation: Secondary | ICD-10-CM | POA: Diagnosis not present

## 2017-06-09 DIAGNOSIS — Z85850 Personal history of malignant neoplasm of thyroid: Secondary | ICD-10-CM | POA: Diagnosis not present

## 2017-06-09 DIAGNOSIS — C8518 Unspecified B-cell lymphoma, lymph nodes of multiple sites: Secondary | ICD-10-CM

## 2017-06-09 DIAGNOSIS — C8510 Unspecified B-cell lymphoma, unspecified site: Secondary | ICD-10-CM

## 2017-06-09 LAB — CBC WITH DIFFERENTIAL/PLATELET
BASO%: 0.9 % (ref 0.0–2.0)
Basophils Absolute: 0.1 10*3/uL (ref 0.0–0.1)
EOS%: 8.6 % — AB (ref 0.0–7.0)
Eosinophils Absolute: 0.5 10*3/uL (ref 0.0–0.5)
HEMATOCRIT: 44.2 % (ref 34.8–46.6)
HGB: 14.9 g/dL (ref 11.6–15.9)
LYMPH#: 1.6 10*3/uL (ref 0.9–3.3)
LYMPH%: 26.1 % (ref 14.0–49.7)
MCH: 32.3 pg (ref 25.1–34.0)
MCHC: 33.7 g/dL (ref 31.5–36.0)
MCV: 96 fL (ref 79.5–101.0)
MONO#: 0.7 10*3/uL (ref 0.1–0.9)
MONO%: 12.1 % (ref 0.0–14.0)
NEUT%: 52.3 % (ref 38.4–76.8)
NEUTROS ABS: 3.2 10*3/uL (ref 1.5–6.5)
PLATELETS: 127 10*3/uL — AB (ref 145–400)
RBC: 4.6 10*6/uL (ref 3.70–5.45)
RDW: 13.8 % (ref 11.2–14.5)
WBC: 6.1 10*3/uL (ref 3.9–10.3)

## 2017-06-09 LAB — COMPREHENSIVE METABOLIC PANEL
ALT: 23 U/L (ref 0–55)
AST: 29 U/L (ref 5–34)
Albumin: 3.9 g/dL (ref 3.5–5.0)
Alkaline Phosphatase: 112 U/L (ref 40–150)
Anion Gap: 12 mEq/L — ABNORMAL HIGH (ref 3–11)
BILIRUBIN TOTAL: 1.14 mg/dL (ref 0.20–1.20)
BUN: 19 mg/dL (ref 7.0–26.0)
CALCIUM: 10.2 mg/dL (ref 8.4–10.4)
CO2: 25 meq/L (ref 22–29)
CREATININE: 0.9 mg/dL (ref 0.6–1.1)
Chloride: 106 mEq/L (ref 98–109)
EGFR: 64 mL/min/{1.73_m2} — ABNORMAL LOW (ref >90)
Glucose: 103 mg/dl (ref 70–140)
Potassium: 4.5 mEq/L (ref 3.5–5.1)
Sodium: 142 mEq/L (ref 136–145)
TOTAL PROTEIN: 6.6 g/dL (ref 6.4–8.3)

## 2017-06-09 LAB — LACTATE DEHYDROGENASE: LDH: 241 U/L (ref 125–245)

## 2017-06-09 MED ORDER — METHYLPREDNISOLONE SODIUM SUCC 125 MG IJ SOLR
125.0000 mg | Freq: Once | INTRAMUSCULAR | Status: AC
  Administered 2017-06-09: 125 mg via INTRAVENOUS

## 2017-06-09 MED ORDER — SODIUM CHLORIDE 0.9 % IV SOLN
375.0000 mg/m2 | Freq: Once | INTRAVENOUS | Status: AC
  Administered 2017-06-09: 800 mg via INTRAVENOUS
  Filled 2017-06-09: qty 50

## 2017-06-09 MED ORDER — ACETAMINOPHEN 325 MG PO TABS
650.0000 mg | ORAL_TABLET | Freq: Once | ORAL | Status: AC
  Administered 2017-06-09: 650 mg via ORAL

## 2017-06-09 MED ORDER — SODIUM CHLORIDE 0.9 % IV SOLN
Freq: Once | INTRAVENOUS | Status: AC
  Administered 2017-06-09: 11:00:00 via INTRAVENOUS

## 2017-06-09 MED ORDER — DIPHENHYDRAMINE HCL 25 MG PO CAPS
ORAL_CAPSULE | ORAL | Status: AC
  Filled 2017-06-09: qty 2

## 2017-06-09 MED ORDER — ACETAMINOPHEN 325 MG PO TABS
ORAL_TABLET | ORAL | Status: AC
  Filled 2017-06-09: qty 2

## 2017-06-09 MED ORDER — FAMOTIDINE IN NACL 20-0.9 MG/50ML-% IV SOLN
INTRAVENOUS | Status: AC
  Filled 2017-06-09: qty 50

## 2017-06-09 MED ORDER — DIPHENHYDRAMINE HCL 25 MG PO CAPS
50.0000 mg | ORAL_CAPSULE | Freq: Once | ORAL | Status: AC
  Administered 2017-06-09: 50 mg via ORAL

## 2017-06-09 MED ORDER — METHYLPREDNISOLONE SODIUM SUCC 125 MG IJ SOLR
INTRAMUSCULAR | Status: AC
  Filled 2017-06-09: qty 2

## 2017-06-09 MED ORDER — FAMOTIDINE IN NACL 20-0.9 MG/50ML-% IV SOLN
20.0000 mg | Freq: Two times a day (BID) | INTRAVENOUS | Status: DC
  Administered 2017-06-09: 20 mg via INTRAVENOUS

## 2017-06-09 NOTE — Progress Notes (Signed)
[facility] OFFICE PROGRESS NOTE   Diagnosis: Non-Hodgkin's lymphoma  INTERVAL HISTORY:   Ms. Mabee returns as scheduled. She was last treated with rituximab on 03/17/2017. She reports tolerating the rituximab well. She felt very well for several days following treatment. She underwent a knee replacement a proximally 6 weeks ago.  No fever or night sweats. No complaint.  Objective:  Vital signs in last 24 hours:  Blood pressure (!) 140/45, pulse 80, temperature 98.3 F (36.8 C), temperature source Oral, resp. rate 18, height 5\' 8"  (1.727 m), weight 204 lb 3.2 oz (92.6 kg), SpO2 100 %.    HEENT: Neck without mass Lymphatics: No cervical, supra-clavicular, axillary, or inguinal nodes Resp: Lungs clear bilaterally Cardio: Regular rate and rhythm GI: No hepatosplenomegaly Vascular: No leg edema   Lab Results:  Lab Results  Component Value Date   WBC 6.1 06/09/2017   HGB 14.9 06/09/2017   HCT 44.2 06/09/2017   MCV 96.0 06/09/2017   PLT 127 (L) 06/09/2017   NEUTROABS 3.2 06/09/2017    CMP     Component Value Date/Time   NA 142 06/09/2017 0808   K 4.5 06/09/2017 0808   CL 102 02/09/2013 0919   CO2 25 06/09/2017 0808   GLUCOSE 103 06/09/2017 0808   GLUCOSE 101 (H) 02/09/2013 0919   BUN 19.0 06/09/2017 0808   CREATININE 0.9 06/09/2017 0808   CALCIUM 10.2 06/09/2017 0808   PROT 6.6 06/09/2017 0808   ALBUMIN 3.9 06/09/2017 0808   AST 29 06/09/2017 0808   ALT 23 06/09/2017 0808   ALKPHOS 112 06/09/2017 0808   BILITOT 1.14 06/09/2017 0808   GFRNONAA 51 (L) 05/24/2012 0530   GFRAA 59 (L) 05/24/2012 0530    Medications: I have reviewed the patient's current medications.  Assessment/Plan: 1. 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 Status post weekly Rituxan x4 03/01/2012 through 03/22/2012. She completed 4 "maintenance "doses of Rituxan, last on  12/19/2012. A restaging CT on 02/09/2013 showed no evidence of lymphoma.   Lymph node lateral to the thyroid bed on a neck ultrasound 02/21/2014, status post an FNA biopsy concerning for a lymphoproliferative disorder.  PET scan 09/28/2016 with active lymphoma within the neck, chest, abdomen, pelvis; splenic enlargement and hypermetabolism suspicious for splenic involvement.  Initiation of Rituxan weekly 4 09/29/2016  Initiation of maintenance Rituxan on a 3 month schedule 12/23/2016 2. Stage IV (T1bN1b M0) papillary thyroid cancer, status post a thyroidectomy with reimplantation of the left superior parathyroid gland on 05/23/2012, status post radioactive iodine therapy, followed by Dr. Buddy Duty.  3. Stage II (T3 N0) colon cancer, status post a right colectomy 10/19/2011, last colonoscopy April 2015-sigmoid adenoma removed.  4. History of a pulmonary embolism December 2012.  5. History of Atrial fibrillation 6. Iron deficiency anemia-new 03/18/2014. Hemoccult positive stool. The anemia corrected with iron. No longer taking iron.  Status post an upper endoscopy and colonoscopy by Dr. Carlean Purl April 2015 with no bleeding source identified, benign adenoma removed from the sigmoid colon. 7. Report of an upper gastric intestinal bleed fall 2016-managed in [place]. [name] 8. Left knee replacement May 2017 , repeat left knee surgery May 2018 9. Pruritic rash 07/22/2016      Disposition:  Ms. Blok is in clinical remission from non-Hodgkin's lymphoma. The plan is to continue maintenance rituximab. She will complete a treatment today. Ms. Koenig will return for an office visit and rituximab in 3 months.  15  minutes were spent with the patient today. The majority of the time was used for counseling and coordination of care.  Donneta Romberg, MD  06/09/2017  9:18 AM

## 2017-06-09 NOTE — Patient Instructions (Signed)
[facility] Discharge Instructions for Patients Receiving Chemotherapy  Today you received the following chemotherapy agents Rituxan To help prevent nausea and vomiting after your treatment, we encourage you to take your nausea medication as prescribed.  If you develop nausea and vomiting that is not controlled by your nausea medication, call the clinic.   BELOW ARE SYMPTOMS THAT SHOULD BE REPORTED IMMEDIATELY:  *FEVER GREATER THAN 100.5 F  *CHILLS WITH OR WITHOUT FEVER  NAUSEA AND VOMITING THAT IS NOT CONTROLLED WITH YOUR NAUSEA MEDICATION  *UNUSUAL SHORTNESS OF BREATH  *UNUSUAL BRUISING OR BLEEDING  TENDERNESS IN MOUTH AND THROAT WITH OR WITHOUT PRESENCE OF ULCERS  *URINARY PROBLEMS  *BOWEL PROBLEMS  UNUSUAL RASH Items with * indicate a potential emergency and should be followed up as soon as possible.  Feel free to call the clinic you have any questions or concerns. The clinic phone number is [phone].  Please show the CHEMO ALERT CARD at check-in to the Emergency Department and triage nurse.   

## 2017-06-15 ENCOUNTER — Telehealth: Payer: Self-pay | Admitting: Oncology

## 2017-06-15 NOTE — Telephone Encounter (Signed)
Appointments scheduled and confirmed with patient, per 06/15/17 los. °

## 2017-06-15 NOTE — Telephone Encounter (Signed)
Appointments scheduled and confirmed with patient, per 06/09/17 los.

## 2017-07-11 NOTE — Progress Notes (Signed)
Cardiology Office Note    Date:  07/12/2017   ID:  Tracy Mclaughlin, DOB [birth year], MRN [id number]  PCP:  Patient, No Pcp Per  Cardiologist: Sinclair Grooms, MD   Chief Complaint  Patient presents with  . Irregular Heart Beat    PAT    History of Present Illness:  Tracy Mclaughlin is a 83 y.o. female has past medical history of stage IV papillary thyroid carcinoma s/p thyroidectomy, postsurgical hypothyroidism, history of paroxysmal atrial fibrillation on ASA, history of PE 11/2011, colon cancer s/p resection, hypertension, GI bleeding, and non-Hodgkin's lymphoma.  No cardiac complaints. Rare episodes of PAT that responded to an extra dose of diltiazem CD 180 mg. No medication side effects. Successful left knee surgery.  Past Medical History:  Diagnosis Date  . Anemia   . Arthritis   . Atrial fibrillation ([place])   . Cancer ([place])    Thyroid/Colon/ B cell Lymphoma-remission  . Diverticulosis   . History of atrial fibrillation    Was in only in a- fib for 2 days  . Hypertension   . Non Hodgkin's lymphoma ([place])    Non-Hodgkin's B-Cell Lymphoma  . Papillary thyroid carcinoma ([place])   . Paroxysmal atrial fibrillation ([facility])   . Post-surgical hypothyroidism   . Pulmonary embolus ([place]) 11/2011  . Subarachnoid hemorrhage ([place]) 1996  . Thyroid disease     Past Surgical History:  Procedure Laterality Date  . APPENDECTOMY  1959  . CHOLECYSTECTOMY  1999   S/P Lap Cholecystectomy  . COLON RESECTION  11/12   mucinous adenocarcinoma of the cecum, resected  . COLONOSCOPY  10/05/2011  . COLONOSCOPY N/A 04/17/2014   Procedure: COLONOSCOPY;  Surgeon: Gatha Mayer, MD;  Location: [facility];  Service: Endoscopy;  Laterality: N/A;  . ESOPHAGOGASTRODUODENOSCOPY N/A 04/17/2014   Procedure: ESOPHAGOGASTRODUODENOSCOPY (EGD);  Surgeon: Gatha Mayer, MD;  Location: [facility];  Service: Endoscopy;  Laterality: N/A;  . KNEE ARTHROSCOPY Left 1998   S/P  Arthroscopic Left knee surgery  .  ROTATOR CUFF REPAIR Right 1998  . SUBDURAL HEMATOMA EVACUATION VIA CRANIOTOMY    . THYROIDECTOMY  05/23/2012   Procedure: THYROIDECTOMY;  Surgeon: [name], MD;  Location: [facility];  Service: General;  Laterality: N/A;  Total thyroidectomy, central compartment lymph node biopsy times two, reimplantation left superior parathyroid gland.  . TUBOPLASTY / TUBOTUBAL ANASTOMOSIS      Current Medications: Outpatient Medications Prior to Visit  Medication Sig Dispense Refill  . aspirin 81 MG tablet Take 81 mg by mouth daily. Reported on 02/26/2016    . citalopram (CELEXA) 40 MG tablet Take 40 mg by mouth daily.    Marland Kitchen diltiazem (DILACOR XR) 180 MG 24 hr capsule Take 1 capsule (180 mg total) by mouth daily. 30 capsule 5  . diphenhydrAMINE (BENADRYL) 25 MG tablet Take 25 mg by mouth at bedtime as needed. For itching/sleep    . losartan (COZAAR) 100 MG tablet Take 100 mg by mouth daily.    Marland Kitchen SYNTHROID 112 MCG tablet Take 224 mcg by mouth daily before breakfast.      No facility-administered medications prior to visit.      Allergies:   Patient has no known allergies.   Social History   Social History  . Marital status: Married    Spouse name: N/A  . Number of children: 0  . Years of education: N/A   Occupational History  . retired    Social History Main Topics  . Smoking status: Former  Smoker    Packs/day: 1.00    Years: 12.00    Types: Cigarettes    Quit date: 08/23/1969  . Smokeless tobacco: Never Used  . Alcohol use Yes     Comment: 1 manhattens per night  . Drug use: No  . Sexual activity: Not Asked   Other Topics Concern  . None   Social History Narrative  . None     Family History:  The patient's family history includes Coronary artery disease in her mother; Diabetes in her mother and sister; Hypertension in her mother and sister; Kidney cancer in her sister; Lung cancer in her brother; Stroke in her sister.   ROS:   Please see the history of present illness.      Recurrence of lymphoma. Now on Rituxan every 3 months. Since knee surgery, difficulty with ambulation and falling has resolved.  All other systems reviewed and are negative.   PHYSICAL EXAM:   VS:  BP 132/70   Pulse 69   Ht 5\' 8"  (1.727 m)   Wt 207 lb (93.9 kg)   SpO2 98%   BMI 31.47 kg/m    GEN: Well nourished, well developed, in no acute distress  HEENT: normal  Neck: no JVD, carotid bruits, or masses Cardiac: RRR; no murmurs, rubs, or gallops,no edema  Respiratory:  clear to auscultation bilaterally, normal work of breathing GI: soft, nontender, nondistended, + BS MS: no deformity or atrophy  Skin: warm and dry, no rash Neuro:  Alert and Oriented x 3, Strength and sensation are intact Psych: euthymic mood, full affect  Wt Readings from Last 3 Encounters:  07/12/17 207 lb (93.9 kg)  06/09/17 204 lb 3.2 oz (92.6 kg)  03/17/17 207 lb 14.4 oz (94.3 kg)      Studies/Labs Reviewed:   EKG:  EKG  Normal sinus rhythm, normal EKG.  Recent Labs: 06/09/2017: ALT 23; BUN 19.0; Creatinine 0.9; HGB 14.9; Platelets 127; Potassium 4.5; Sodium 142   Lipid Panel No results found for: CHOL, TRIG, HDL, CHOLHDL, VLDL, LDLCALC, LDLDIRECT  Additional studies/ records that were reviewed today include:  None    ASSESSMENT:    1. PAF (paroxysmal atrial fibrillation) ([place])   2. Essential hypertension   3. Other pulmonary embolism without acute cor pulmonale, unspecified chronicity ([place])   4. Long term (current) use of anticoagulants-xarelto - afib and hx PE   5. Subarachnoid hemorrhage ([facility])      PLAN:  In order of problems listed above:  1. Stable with rare instances of recurrent PAT. According to the patient they resolved with an extra diltiazem CD 180 mg. 2. Well-controlled blood pressure. A low-salt diet is recommended. Aerobic exercises recommended. 3. Not addressed 4. Anticoagulation discontinued. Now on aspirin 81 mg per day. 5. Not addressed.  Plan continued aerobic  exercise, clinical follow-up in one year, okay to use additional dose of diltiazem to control rate and rhythm. If that formalin stops working, she should contact us.    Medication Adjustments/Labs and Tests Ordered: Current medicines are reviewed at length with the patient today.  Concerns regarding medicines are outlined above.  Medication changes, Labs and Tests ordered today are listed in the Patient Instructions below. Patient Instructions  Medication Instructions:  None  Labwork: None  Testing/Procedures: None  Follow-Up: Your physician wants you to follow-up in: 1 year with Dr. Tamala Julian.  You will receive a reminder letter in the mail two months in advance. If you don't receive a letter, please call our office to  schedule the follow-up appointment.   Any Other Special Instructions Will Be Listed Below (If Applicable).     If you need a refill on your cardiac medications before your next appointment, please call your pharmacy.      Signed, Sinclair Grooms, MD  07/12/2017 9:06 AM    [facility] [place] [address], [place], [place]  [address] Phone: [phone]; Fax: [phone]

## 2017-07-12 ENCOUNTER — Ambulatory Visit (INDEPENDENT_AMBULATORY_CARE_PROVIDER_SITE_OTHER): Payer: Medicare Other | Admitting: Interventional Cardiology

## 2017-07-12 ENCOUNTER — Encounter: Payer: Self-pay | Admitting: Interventional Cardiology

## 2017-07-12 VITALS — BP 132/70 | HR 69 | Ht 68.0 in | Wt 207.0 lb

## 2017-07-12 DIAGNOSIS — I2699 Other pulmonary embolism without acute cor pulmonale: Secondary | ICD-10-CM

## 2017-07-12 DIAGNOSIS — I609 Nontraumatic subarachnoid hemorrhage, unspecified: Secondary | ICD-10-CM

## 2017-07-12 DIAGNOSIS — I480 Paroxysmal atrial fibrillation: Secondary | ICD-10-CM

## 2017-07-12 DIAGNOSIS — Z7901 Long term (current) use of anticoagulants: Secondary | ICD-10-CM | POA: Diagnosis not present

## 2017-07-12 DIAGNOSIS — I10 Essential (primary) hypertension: Secondary | ICD-10-CM | POA: Diagnosis not present

## 2017-07-12 NOTE — Patient Instructions (Signed)
Medication Instructions:  None  Labwork: None  Testing/Procedures: None  Follow-Up: Your physician wants you to follow-up in: 1 year with Dr. Laskin.  You will receive a reminder letter in the mail two months in advance. If you don't receive a letter, please call our office to schedule the follow-up appointment.   Any Other Special Instructions Will Be Listed Below (If Applicable).     If you need a refill on your cardiac medications before your next appointment, please call your pharmacy.   

## 2017-09-08 ENCOUNTER — Ambulatory Visit (HOSPITAL_BASED_OUTPATIENT_CLINIC_OR_DEPARTMENT_OTHER): Payer: Medicare Other

## 2017-09-08 ENCOUNTER — Other Ambulatory Visit (HOSPITAL_BASED_OUTPATIENT_CLINIC_OR_DEPARTMENT_OTHER): Payer: Medicare Other

## 2017-09-08 ENCOUNTER — Ambulatory Visit (HOSPITAL_BASED_OUTPATIENT_CLINIC_OR_DEPARTMENT_OTHER): Payer: Medicare Other | Admitting: Oncology

## 2017-09-08 ENCOUNTER — Telehealth: Payer: Self-pay | Admitting: Oncology

## 2017-09-08 VITALS — BP 155/68 | HR 68 | Temp 98.3°F | Resp 18 | Ht 68.0 in | Wt 208.2 lb

## 2017-09-08 VITALS — BP 127/44 | HR 70 | Temp 98.0°F | Resp 18

## 2017-09-08 DIAGNOSIS — C8267 Cutaneous follicle center lymphoma, spleen: Secondary | ICD-10-CM

## 2017-09-08 DIAGNOSIS — C8261 Cutaneous follicle center lymphoma, lymph nodes of head, face, and neck: Secondary | ICD-10-CM

## 2017-09-08 DIAGNOSIS — Z86711 Personal history of pulmonary embolism: Secondary | ICD-10-CM | POA: Diagnosis not present

## 2017-09-08 DIAGNOSIS — I4891 Unspecified atrial fibrillation: Secondary | ICD-10-CM

## 2017-09-08 DIAGNOSIS — C8518 Unspecified B-cell lymphoma, lymph nodes of multiple sites: Secondary | ICD-10-CM

## 2017-09-08 DIAGNOSIS — L299 Pruritus, unspecified: Secondary | ICD-10-CM

## 2017-09-08 DIAGNOSIS — Z5112 Encounter for antineoplastic immunotherapy: Secondary | ICD-10-CM | POA: Diagnosis not present

## 2017-09-08 DIAGNOSIS — C8510 Unspecified B-cell lymphoma, unspecified site: Secondary | ICD-10-CM

## 2017-09-08 LAB — CBC WITH DIFFERENTIAL/PLATELET
BASO%: 0.7 % (ref 0.0–2.0)
BASOS ABS: 0.1 10*3/uL (ref 0.0–0.1)
EOS ABS: 0.4 10*3/uL (ref 0.0–0.5)
EOS%: 4.2 % (ref 0.0–7.0)
HCT: 44 % (ref 34.8–46.6)
HEMOGLOBIN: 15.1 g/dL (ref 11.6–15.9)
LYMPH#: 2 10*3/uL (ref 0.9–3.3)
LYMPH%: 21.4 % (ref 14.0–49.7)
MCH: 32.8 pg (ref 25.1–34.0)
MCHC: 34.3 g/dL (ref 31.5–36.0)
MCV: 95.7 fL (ref 79.5–101.0)
MONO#: 0.9 10*3/uL (ref 0.1–0.9)
MONO%: 10 % (ref 0.0–14.0)
NEUT%: 63.7 % (ref 38.4–76.8)
NEUTROS ABS: 5.9 10*3/uL (ref 1.5–6.5)
NRBC: 0 % (ref 0–0)
PLATELETS: 151 10*3/uL (ref 145–400)
RBC: 4.6 10*6/uL (ref 3.70–5.45)
RDW: 13.4 % (ref 11.2–14.5)
WBC: 9.2 10*3/uL (ref 3.9–10.3)

## 2017-09-08 LAB — COMPREHENSIVE METABOLIC PANEL
ALBUMIN: 3.9 g/dL (ref 3.5–5.0)
ALK PHOS: 132 U/L (ref 40–150)
ALT: 12 U/L (ref 0–55)
AST: 20 U/L (ref 5–34)
Anion Gap: 9 mEq/L (ref 3–11)
BUN: 24.2 mg/dL (ref 7.0–26.0)
CO2: 25 mEq/L (ref 22–29)
CREATININE: 0.9 mg/dL (ref 0.6–1.1)
Calcium: 9.7 mg/dL (ref 8.4–10.4)
Chloride: 105 mEq/L (ref 98–109)
EGFR: 60 mL/min/{1.73_m2} — AB (ref >90)
GLUCOSE: 100 mg/dL (ref 70–140)
Potassium: 4.7 mEq/L (ref 3.5–5.1)
SODIUM: 140 meq/L (ref 136–145)
Total Bilirubin: 1.26 mg/dL — ABNORMAL HIGH (ref 0.20–1.20)
Total Protein: 6.8 g/dL (ref 6.4–8.3)

## 2017-09-08 MED ORDER — SODIUM CHLORIDE 0.9 % IV SOLN
375.0000 mg/m2 | Freq: Once | INTRAVENOUS | Status: AC
  Administered 2017-09-08: 800 mg via INTRAVENOUS
  Filled 2017-09-08: qty 30

## 2017-09-08 MED ORDER — FAMOTIDINE IN NACL 20-0.9 MG/50ML-% IV SOLN
INTRAVENOUS | Status: AC
  Filled 2017-09-08: qty 50

## 2017-09-08 MED ORDER — METHYLPREDNISOLONE SODIUM SUCC 125 MG IJ SOLR
125.0000 mg | Freq: Once | INTRAMUSCULAR | Status: AC
  Administered 2017-09-08: 125 mg via INTRAVENOUS

## 2017-09-08 MED ORDER — FAMOTIDINE IN NACL 20-0.9 MG/50ML-% IV SOLN
20.0000 mg | Freq: Two times a day (BID) | INTRAVENOUS | Status: DC
Start: 2017-09-08 — End: 2017-09-08
  Administered 2017-09-08: 20 mg via INTRAVENOUS

## 2017-09-08 MED ORDER — SODIUM CHLORIDE 0.9 % IV SOLN
Freq: Once | INTRAVENOUS | Status: AC
  Administered 2017-09-08: 11:00:00 via INTRAVENOUS

## 2017-09-08 MED ORDER — DIPHENHYDRAMINE HCL 25 MG PO CAPS
ORAL_CAPSULE | ORAL | Status: AC
  Filled 2017-09-08: qty 2

## 2017-09-08 MED ORDER — ACETAMINOPHEN 325 MG PO TABS
650.0000 mg | ORAL_TABLET | Freq: Once | ORAL | Status: AC
  Administered 2017-09-08: 650 mg via ORAL

## 2017-09-08 MED ORDER — ACETAMINOPHEN 325 MG PO TABS
ORAL_TABLET | ORAL | Status: AC
  Filled 2017-09-08: qty 2

## 2017-09-08 MED ORDER — METHYLPREDNISOLONE SODIUM SUCC 125 MG IJ SOLR
INTRAMUSCULAR | Status: AC
  Filled 2017-09-08: qty 2

## 2017-09-08 MED ORDER — DIPHENHYDRAMINE HCL 25 MG PO CAPS
50.0000 mg | ORAL_CAPSULE | Freq: Once | ORAL | Status: AC
  Administered 2017-09-08: 50 mg via ORAL

## 2017-09-08 NOTE — Patient Instructions (Signed)
[facility] Discharge Instructions for Patients Receiving Chemotherapy  Today you received the following chemotherapy agents: Rituxan   To help prevent nausea and vomiting after your treatment, we encourage you to take your nausea medication as directed.    If you develop nausea and vomiting that is not controlled by your nausea medication, call the clinic.   BELOW ARE SYMPTOMS THAT SHOULD BE REPORTED IMMEDIATELY:  *FEVER GREATER THAN 100.5 F  *CHILLS WITH OR WITHOUT FEVER  NAUSEA AND VOMITING THAT IS NOT CONTROLLED WITH YOUR NAUSEA MEDICATION  *UNUSUAL SHORTNESS OF BREATH  *UNUSUAL BRUISING OR BLEEDING  TENDERNESS IN MOUTH AND THROAT WITH OR WITHOUT PRESENCE OF ULCERS  *URINARY PROBLEMS  *BOWEL PROBLEMS  UNUSUAL RASH Items with * indicate a potential emergency and should be followed up as soon as possible.  Feel free to call the clinic you have any questions or concerns. The clinic phone number is [phone].  Please show the CHEMO ALERT CARD at check-in to the Emergency Department and triage nurse.   

## 2017-09-08 NOTE — Addendum Note (Signed)
Addended by: Betsy Coder B on: 09/08/2017 10:11 AM   Modules accepted: Orders

## 2017-09-08 NOTE — Telephone Encounter (Signed)
Scheduled appt per 9/20 los - sent reminder letter in the mail and left message

## 2017-09-08 NOTE — Progress Notes (Signed)
  [place] OFFICE PROGRESS NOTE   Diagnosis: Non-Hodgkin's lymphoma  INTERVAL HISTORY:   Tracy Mclaughlin returns as scheduled. She feels well. She was last treated with rituximab 06/09/2017. She reports no allergic reaction with the rituximab infusion. No fever or night sweats. No palpable lymph nodes. Good appetite. She is completing physical therapy following the knee surgery in May.  Objective:  Vital signs in last 24 hours:  Blood pressure (!) 155/68, pulse 68, temperature 98.3 F (36.8 C), temperature source Oral, resp. rate 18, height 5\' 8"  (1.727 m), weight 208 lb 3.2 oz (94.4 kg), SpO2 94 %.    HEENT: Neck without mass Lymphatics: No cervical or supra-clavicular nodes. 1 cm mobile nodule at the left clavicle, 1 cm high bilateral axillary node versus prominent fat pads. No inguinal nodes. Resp: Lungs clear bilaterally Cardio: Regular rate and rhythm GI: No hepatosplenomegaly Vascular: No leg edema, left lower leg is slightly larger than the right side  Skin: Flat erythematous rash at the left upper and lower back with excoriations    Lab Results:  Lab Results  Component Value Date   WBC 6.1 06/09/2017   HGB 14.9 06/09/2017   HCT 44.2 06/09/2017   MCV 96.0 06/09/2017   PLT 127 (L) 06/09/2017   NEUTROABS 3.2 06/09/2017   Medications: I have reviewed the patient's current medications.  Assessment/Plan: 1. 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 Status post weekly Rituxan x4 03/01/2012 through 03/22/2012. She completed 4 "maintenance "doses of Rituxan, last on 12/19/2012. A restaging CT on 02/09/2013 showed no evidence of lymphoma.   Lymph node lateral to the thyroid bed on a neck ultrasound 02/21/2014, status post an FNA biopsy concerning for a lymphoproliferative disorder.  PET scan 09/28/2016 with active lymphoma within the neck, chest, abdomen,  pelvis; splenic enlargement and hypermetabolism suspicious for splenic involvement.  Initiation of Rituxan weekly 4 09/29/2016  Initiation of maintenance Rituxan on a 3 month schedule 12/23/2016 2. Stage IV (T1bN1b M0) papillary thyroid cancer, status post a thyroidectomy with reimplantation of the left superior parathyroid gland on 05/23/2012, status post radioactive iodine therapy, followed by Dr. Buddy Duty.  3. Stage II (T3 N0) colon cancer, status post a right colectomy 10/19/2011, last colonoscopy April 2015-sigmoid adenoma removed.  4. History of a pulmonary embolism December 2012.  5. History of Atrial fibrillation 6. Iron deficiency anemia-new 03/18/2014. Hemoccult positive stool. The anemia corrected with iron. No longer taking iron.  Status post an upper endoscopy and colonoscopy by Dr. Carlean Purl April 2015 with no bleeding source identified, benign adenoma removed from the sigmoid colon. 7. Report of an upper gastric intestinal bleed fall 2016-managed in [place]. [name] 8. Left knee replacement May 2017 , repeat left knee surgery May 2018 9. Pruritic rash 07/22/2016-persistent     Disposition:  Tracy Mclaughlin is in clinical remission from non-Hodgkin's lymphoma. We will follow-up on the CBC and LDH from today. She will complete another treatment with rituximab today. She will receive an influenza vaccine today. She has a chronic and intermittent pruritic erythematous rash and reports being evaluated by dermatology.  Tracy Mclaughlin will return for an office visit and rituximab in 3 months.  15 minutes were spent with the patient today. The majority of the time was used for counseling and coordination of care.  Donneta Romberg, MD  09/08/2017  10:04 AM

## 2017-09-08 NOTE — Progress Notes (Signed)
Per Lavella Lemons, RN per Dr. Benay Spice okay to increase Rituxan rate every 15 minutes instead of every 30 minutes.

## 2017-12-08 ENCOUNTER — Other Ambulatory Visit (HOSPITAL_BASED_OUTPATIENT_CLINIC_OR_DEPARTMENT_OTHER): Payer: Medicare Other

## 2017-12-08 ENCOUNTER — Ambulatory Visit (HOSPITAL_BASED_OUTPATIENT_CLINIC_OR_DEPARTMENT_OTHER): Payer: Medicare Other

## 2017-12-08 ENCOUNTER — Ambulatory Visit (HOSPITAL_BASED_OUTPATIENT_CLINIC_OR_DEPARTMENT_OTHER): Payer: Medicare Other | Admitting: Oncology

## 2017-12-08 ENCOUNTER — Other Ambulatory Visit: Payer: Self-pay | Admitting: *Deleted

## 2017-12-08 VITALS — BP 137/53 | HR 55 | Temp 98.1°F | Resp 20 | Ht 68.0 in | Wt 214.6 lb

## 2017-12-08 VITALS — BP 134/53 | HR 55 | Temp 98.7°F | Resp 18

## 2017-12-08 DIAGNOSIS — C8518 Unspecified B-cell lymphoma, lymph nodes of multiple sites: Secondary | ICD-10-CM

## 2017-12-08 DIAGNOSIS — C8267 Cutaneous follicle center lymphoma, spleen: Secondary | ICD-10-CM

## 2017-12-08 DIAGNOSIS — C8261 Cutaneous follicle center lymphoma, lymph nodes of head, face, and neck: Secondary | ICD-10-CM

## 2017-12-08 DIAGNOSIS — Z5112 Encounter for antineoplastic immunotherapy: Secondary | ICD-10-CM | POA: Diagnosis not present

## 2017-12-08 DIAGNOSIS — M2550 Pain in unspecified joint: Secondary | ICD-10-CM | POA: Diagnosis not present

## 2017-12-08 DIAGNOSIS — C8510 Unspecified B-cell lymphoma, unspecified site: Secondary | ICD-10-CM

## 2017-12-08 DIAGNOSIS — I4891 Unspecified atrial fibrillation: Secondary | ICD-10-CM | POA: Diagnosis not present

## 2017-12-08 LAB — CBC WITH DIFFERENTIAL/PLATELET
BASO%: 0.6 % (ref 0.0–2.0)
BASOS ABS: 0 10*3/uL (ref 0.0–0.1)
EOS ABS: 0.1 10*3/uL (ref 0.0–0.5)
EOS%: 1.9 % (ref 0.0–7.0)
HCT: 44.4 % (ref 34.8–46.6)
HGB: 14.9 g/dL (ref 11.6–15.9)
LYMPH%: 30 % (ref 14.0–49.7)
MCH: 32.7 pg (ref 25.1–34.0)
MCHC: 33.6 g/dL (ref 31.5–36.0)
MCV: 97.6 fL (ref 79.5–101.0)
MONO#: 0.8 10*3/uL (ref 0.1–0.9)
MONO%: 12.4 % (ref 0.0–14.0)
NEUT%: 55.1 % (ref 38.4–76.8)
NEUTROS ABS: 3.5 10*3/uL (ref 1.5–6.5)
PLATELETS: 134 10*3/uL — AB (ref 145–400)
RBC: 4.55 10*6/uL (ref 3.70–5.45)
RDW: 12.9 % (ref 11.2–14.5)
WBC: 6.3 10*3/uL (ref 3.9–10.3)
lymph#: 1.9 10*3/uL (ref 0.9–3.3)

## 2017-12-08 LAB — COMPREHENSIVE METABOLIC PANEL
ALT: 27 U/L (ref 0–55)
ANION GAP: 10 meq/L (ref 3–11)
AST: 19 U/L (ref 5–34)
Albumin: 4.1 g/dL (ref 3.5–5.0)
Alkaline Phosphatase: 84 U/L (ref 40–150)
BUN: 26.3 mg/dL — AB (ref 7.0–26.0)
CHLORIDE: 102 meq/L (ref 98–109)
CO2: 28 meq/L (ref 22–29)
CREATININE: 1 mg/dL (ref 0.6–1.1)
Calcium: 9.2 mg/dL (ref 8.4–10.4)
EGFR: 50 mL/min/{1.73_m2} — ABNORMAL LOW (ref >60)
Glucose: 94 mg/dl (ref 70–140)
POTASSIUM: 4.6 meq/L (ref 3.5–5.1)
Sodium: 140 mEq/L (ref 136–145)
Total Bilirubin: 1.08 mg/dL (ref 0.20–1.20)
Total Protein: 6.8 g/dL (ref 6.4–8.3)

## 2017-12-08 LAB — LACTATE DEHYDROGENASE: LDH: 208 U/L (ref 125–245)

## 2017-12-08 MED ORDER — FAMOTIDINE IN NACL 20-0.9 MG/50ML-% IV SOLN
20.0000 mg | Freq: Once | INTRAVENOUS | Status: AC
  Administered 2017-12-08: 20 mg via INTRAVENOUS

## 2017-12-08 MED ORDER — SODIUM CHLORIDE 0.9 % IV SOLN
375.0000 mg/m2 | Freq: Once | INTRAVENOUS | Status: AC
  Administered 2017-12-08: 800 mg via INTRAVENOUS
  Filled 2017-12-08: qty 50

## 2017-12-08 MED ORDER — FAMOTIDINE IN NACL 20-0.9 MG/50ML-% IV SOLN
INTRAVENOUS | Status: AC
  Filled 2017-12-08: qty 50

## 2017-12-08 MED ORDER — METHYLPREDNISOLONE SODIUM SUCC 125 MG IJ SOLR
INTRAMUSCULAR | Status: AC
  Filled 2017-12-08: qty 2

## 2017-12-08 MED ORDER — ACETAMINOPHEN 325 MG PO TABS
ORAL_TABLET | ORAL | Status: AC
  Filled 2017-12-08: qty 2

## 2017-12-08 MED ORDER — DIPHENHYDRAMINE HCL 25 MG PO CAPS
50.0000 mg | ORAL_CAPSULE | Freq: Once | ORAL | Status: AC
  Administered 2017-12-08: 50 mg via ORAL

## 2017-12-08 MED ORDER — METHYLPREDNISOLONE SODIUM SUCC 125 MG IJ SOLR
125.0000 mg | Freq: Once | INTRAMUSCULAR | Status: AC
Start: 2017-12-08 — End: 2017-12-08
  Administered 2017-12-08: 125 mg via INTRAVENOUS

## 2017-12-08 MED ORDER — SODIUM CHLORIDE 0.9 % IV SOLN
Freq: Once | INTRAVENOUS | Status: AC
  Administered 2017-12-08: 11:00:00 via INTRAVENOUS

## 2017-12-08 MED ORDER — ACETAMINOPHEN 325 MG PO TABS
650.0000 mg | ORAL_TABLET | Freq: Once | ORAL | Status: AC
  Administered 2017-12-08: 650 mg via ORAL

## 2017-12-08 MED ORDER — DIPHENHYDRAMINE HCL 25 MG PO CAPS
ORAL_CAPSULE | ORAL | Status: AC
  Filled 2017-12-08: qty 2

## 2017-12-08 NOTE — Patient Instructions (Signed)
[facility] Discharge Instructions for Patients Receiving Chemotherapy  Today you received the following chemotherapy agents:  Rituxan.  To help prevent nausea and vomiting after your treatment, we encourage you to take your nausea medication as directed.   If you develop nausea and vomiting that is not controlled by your nausea medication, call the clinic.   BELOW ARE SYMPTOMS THAT SHOULD BE REPORTED IMMEDIATELY:  *FEVER GREATER THAN 100.5 F  *CHILLS WITH OR WITHOUT FEVER  NAUSEA AND VOMITING THAT IS NOT CONTROLLED WITH YOUR NAUSEA MEDICATION  *UNUSUAL SHORTNESS OF BREATH  *UNUSUAL BRUISING OR BLEEDING  TENDERNESS IN MOUTH AND THROAT WITH OR WITHOUT PRESENCE OF ULCERS  *URINARY PROBLEMS  *BOWEL PROBLEMS  UNUSUAL RASH Items with * indicate a potential emergency and should be followed up as soon as possible.  Feel free to call the clinic should you have any questions or concerns. The clinic phone number is [phone].  Please show the CHEMO ALERT CARD at check-in to the Emergency Department and triage nurse.   

## 2017-12-08 NOTE — Progress Notes (Signed)
[facility] OFFICE PROGRESS NOTE   Diagnosis: Non-Hodgkin's lymphoma  INTERVAL HISTORY:   Ms. Dilley returns as scheduled.  She completed another treatment with rituximab 09/08/2017.  She reports tolerating the rituximab well.  No fever, night sweats, or palpable lymph nodes.  Good appetite. She developed diffuse arthralgias and stiffness approximately 1 month ago.  She is completing a prednisone taper.  The arthralgias have improved.  No joint swelling or erythema.  She reports her sister was diagnosed with rheumatoid arthritis at age 59.  Objective:  Vital signs in last 24 hours:  Blood pressure (!) 137/53, pulse (!) 55, temperature 98.1 F (36.7 C), temperature source Oral, resp. rate 20, height 5\' 8"  (1.727 m), weight 214 lb 9.6 oz (97.3 kg), SpO2 99 %.    HEENT: No thrush Lymphatics: No cervical, supraclavicular, axillary, or inguinal nodes Resp: Lungs clear bilaterally Cardio: Regular rate and rhythm GI: No hepatosplenomegaly, no mass, nontender Vascular: No leg edema Musculoskeletal: Synovial hypertrophy at several of the hand joints.  No erythema or joint swelling.    Lab Results:  Lab Results  Component Value Date   WBC 6.3 12/08/2017   HGB 14.9 12/08/2017   HCT 44.4 12/08/2017   MCV 97.6 12/08/2017   PLT 134 (L) 12/08/2017   NEUTROABS 3.5 12/08/2017    CMP     Component Value Date/Time   NA 140 12/08/2017 0909   K 4.6 12/08/2017 0909   CL 102 02/09/2013 0919   CO2 28 12/08/2017 0909   GLUCOSE 94 12/08/2017 0909   GLUCOSE 101 (H) 02/09/2013 0919   BUN 26.3 (H) 12/08/2017 0909   CREATININE 1.0 12/08/2017 0909   CALCIUM 9.2 12/08/2017 0909   PROT 6.8 12/08/2017 0909   ALBUMIN 4.1 12/08/2017 0909   AST 19 12/08/2017 0909   ALT 27 12/08/2017 0909   ALKPHOS 84 12/08/2017 0909   BILITOT 1.08 12/08/2017 0909   GFRNONAA 51 (L) 05/24/2012 0530   GFRAA 59 (L) 05/24/2012 0530    Medications: I have reviewed the patient's current  medications.   Assessment/Plan: 1. 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 Status post weekly Rituxan x4 03/01/2012 through 03/22/2012. She completed 4 "maintenance "doses of Rituxan, last on 12/19/2012. A restaging CT on 02/09/2013 showed no evidence of lymphoma.   Lymph node lateral to the thyroid bed on a neck ultrasound 02/21/2014, status post an FNA biopsy concerning for a lymphoproliferative disorder.  PET scan 09/28/2016 with active lymphoma within the neck, chest, abdomen, pelvis; splenic enlargement and hypermetabolism suspicious for splenic involvement.  Initiation of Rituxan weekly 4 09/29/2016  Initiation of maintenance Rituxan on a 3 month schedule 12/23/2016 2. Stage IV (T1bN1b M0) papillary thyroid cancer, status post a thyroidectomy with reimplantation of the left superior parathyroid gland on 05/23/2012, status post radioactive iodine therapy, followed by Dr. Buddy Duty.  3. Stage II (T3 N0) colon cancer, status post a right colectomy 10/19/2011, last colonoscopy April 2015-sigmoid adenoma removed.  4. History of a pulmonary embolism December 2012.  5. History of Atrial fibrillation 6. Iron deficiency anemia-new 03/18/2014. Hemoccult positive stool. The anemia corrected with iron. No longer taking iron.  Status post an upper endoscopy and colonoscopy by Dr. Carlean Purl April 2015 with no bleeding source identified, benign adenoma removed from the sigmoid colon. 7. Report of an upper gastric intestinal bleed fall 2016-managed in [place]. [name] 8. Left knee replacement May 2017 , repeat left knee surgery May 2018  9. Pruritic rash 07/22/2016-persistent   Disposition: Ms. Yohe remains in clinical remission from the marginal zone lymphoma.  She is tolerating the rituximab well.  She will complete another treatment with rituximab today.  She developed severe arthralgias last  month.  She is completing a prednisone taper.  We will check a rheumatoid factor today.  Ms. Pendelton will return for office visit and rituximab in 3 months.  15 minutes were spent with the patient today.  The majority of the time was used for counseling and coordination of care.  Betsy Coder, MD  12/08/2017  4:26 PM

## 2017-12-09 ENCOUNTER — Telehealth: Payer: Self-pay | Admitting: *Deleted

## 2017-12-09 LAB — RHEUMATOID FACTOR

## 2017-12-09 NOTE — Telephone Encounter (Signed)
Pt notified of RA test

## 2017-12-09 NOTE — Telephone Encounter (Signed)
-----   Message from [name], MD sent at 12/09/2017  7:00 AM EST ----- Please call patient, test for rheumatoid arthritis is negative

## 2017-12-12 ENCOUNTER — Telehealth: Payer: Self-pay | Admitting: Oncology

## 2017-12-12 NOTE — Telephone Encounter (Signed)
Scheduled appts per 12/20 los - sending confirmation letters in the mail.

## 2018-03-05 ENCOUNTER — Other Ambulatory Visit: Payer: Self-pay | Admitting: Oncology

## 2018-03-09 ENCOUNTER — Inpatient Hospital Stay: Payer: Medicare Other

## 2018-03-09 ENCOUNTER — Inpatient Hospital Stay: Payer: Medicare Other | Attending: Oncology | Admitting: Oncology

## 2018-03-09 ENCOUNTER — Telehealth: Payer: Self-pay | Admitting: Oncology

## 2018-03-09 VITALS — BP 130/48 | HR 69 | Temp 97.9°F | Ht 68.0 in | Wt 222.1 lb

## 2018-03-09 VITALS — BP 125/47 | HR 65 | Temp 98.8°F | Resp 18

## 2018-03-09 DIAGNOSIS — C8261 Cutaneous follicle center lymphoma, lymph nodes of head, face, and neck: Secondary | ICD-10-CM

## 2018-03-09 DIAGNOSIS — C8267 Cutaneous follicle center lymphoma, spleen: Secondary | ICD-10-CM | POA: Diagnosis present

## 2018-03-09 DIAGNOSIS — C8590 Non-Hodgkin lymphoma, unspecified, unspecified site: Secondary | ICD-10-CM

## 2018-03-09 DIAGNOSIS — Z5112 Encounter for antineoplastic immunotherapy: Secondary | ICD-10-CM | POA: Diagnosis not present

## 2018-03-09 DIAGNOSIS — C8518 Unspecified B-cell lymphoma, lymph nodes of multiple sites: Secondary | ICD-10-CM

## 2018-03-09 DIAGNOSIS — C8510 Unspecified B-cell lymphoma, unspecified site: Secondary | ICD-10-CM

## 2018-03-09 LAB — CBC WITH DIFFERENTIAL/PLATELET
BASOS ABS: 0.1 10*3/uL (ref 0.0–0.1)
Basophils Relative: 1 %
Eosinophils Absolute: 0.3 10*3/uL (ref 0.0–0.5)
Eosinophils Relative: 4 %
HEMATOCRIT: 42.6 % (ref 34.8–46.6)
Hemoglobin: 14.5 g/dL (ref 11.6–15.9)
LYMPHS ABS: 2.1 10*3/uL (ref 0.9–3.3)
LYMPHS PCT: 30 %
MCH: 33.2 pg (ref 25.1–34.0)
MCHC: 34 g/dL (ref 31.5–36.0)
MCV: 97.5 fL (ref 79.5–101.0)
Monocytes Absolute: 0.6 10*3/uL (ref 0.1–0.9)
Monocytes Relative: 8 %
NEUTROS PCT: 57 %
Neutro Abs: 4.1 10*3/uL (ref 1.5–6.5)
PLATELETS: 144 10*3/uL — AB (ref 145–400)
RBC: 4.37 MIL/uL (ref 3.70–5.45)
RDW: 12.9 % (ref 11.2–14.5)
WBC: 7.1 10*3/uL (ref 3.9–10.3)

## 2018-03-09 LAB — COMPREHENSIVE METABOLIC PANEL
ALT: 16 U/L (ref 0–55)
AST: 24 U/L (ref 5–34)
Albumin: 3.8 g/dL (ref 3.5–5.0)
Alkaline Phosphatase: 109 U/L (ref 40–150)
Anion gap: 10 (ref 3–11)
BILIRUBIN TOTAL: 1 mg/dL (ref 0.2–1.2)
BUN: 20 mg/dL (ref 7–26)
CHLORIDE: 105 mmol/L (ref 98–109)
CO2: 26 mmol/L (ref 22–29)
Calcium: 9.5 mg/dL (ref 8.4–10.4)
Creatinine, Ser: 0.95 mg/dL (ref 0.60–1.10)
GFR calc Af Amer: >60 mL/min (ref >60)
GFR, EST NON AFRICAN AMERICAN: 55 mL/min — AB (ref >60)
Glucose, Bld: 93 mg/dL (ref 70–140)
POTASSIUM: 5.1 mmol/L (ref 3.5–5.1)
Sodium: 141 mmol/L (ref 136–145)
TOTAL PROTEIN: 6.7 g/dL (ref 6.4–8.3)

## 2018-03-09 MED ORDER — DIPHENHYDRAMINE HCL 25 MG PO CAPS
50.0000 mg | ORAL_CAPSULE | Freq: Once | ORAL | Status: AC
  Administered 2018-03-09: 50 mg via ORAL

## 2018-03-09 MED ORDER — FAMOTIDINE IN NACL 20-0.9 MG/50ML-% IV SOLN
20.0000 mg | Freq: Once | INTRAVENOUS | Status: AC
  Administered 2018-03-09: 20 mg via INTRAVENOUS

## 2018-03-09 MED ORDER — METHYLPREDNISOLONE SODIUM SUCC 125 MG IJ SOLR
125.0000 mg | Freq: Once | INTRAMUSCULAR | Status: AC
  Administered 2018-03-09: 125 mg via INTRAVENOUS

## 2018-03-09 MED ORDER — ACETAMINOPHEN 325 MG PO TABS
ORAL_TABLET | ORAL | Status: AC
  Filled 2018-03-09: qty 2

## 2018-03-09 MED ORDER — SODIUM CHLORIDE 0.9 % IV SOLN
Freq: Once | INTRAVENOUS | Status: AC
  Administered 2018-03-09: 12:00:00 via INTRAVENOUS

## 2018-03-09 MED ORDER — METHYLPREDNISOLONE SODIUM SUCC 125 MG IJ SOLR
INTRAMUSCULAR | Status: AC
  Filled 2018-03-09: qty 2

## 2018-03-09 MED ORDER — ACETAMINOPHEN 325 MG PO TABS
650.0000 mg | ORAL_TABLET | Freq: Once | ORAL | Status: AC
  Administered 2018-03-09: 650 mg via ORAL

## 2018-03-09 MED ORDER — FAMOTIDINE IN NACL 20-0.9 MG/50ML-% IV SOLN
INTRAVENOUS | Status: AC
  Filled 2018-03-09: qty 50

## 2018-03-09 MED ORDER — RITUXIMAB CHEMO INJECTION 500 MG/50ML
375.0000 mg/m2 | Freq: Once | INTRAVENOUS | Status: AC
  Administered 2018-03-09: 800 mg via INTRAVENOUS
  Filled 2018-03-09: qty 50

## 2018-03-09 MED ORDER — DIPHENHYDRAMINE HCL 25 MG PO CAPS
ORAL_CAPSULE | ORAL | Status: AC
  Filled 2018-03-09: qty 2

## 2018-03-09 NOTE — Telephone Encounter (Signed)
Scheduled appt per 3/21 los - sent reminder letter in the mail and left message with appt date and time.

## 2018-03-09 NOTE — Progress Notes (Signed)
[facility] OFFICE PROGRESS NOTE   Diagnosis: Non-Hodgkin's lymphoma  INTERVAL HISTORY:   [name] returns as scheduled.  She was last treated with rituximab on 12/08/2018.  She tolerated the rituximab well.  No fever, night sweats, or anorexia.  No palpable lymph nodes.  Arthritis symptoms have resolved.  Objective:  Vital signs in last 24 hours:  Blood pressure (!) 130/48, pulse 69, temperature 97.9 F (36.6 C), temperature source Oral, height 5\' 8"  (1.727 m), weight 222 lb 1.6 oz (100.7 kg), SpO2 100 %.    HEENT: Neck without mass Lymphatics: No cervical, supraclavicular, axillary, or inguinal nodes Resp: Lungs clear bilaterally Cardio: Regular rate and rhythm GI: No hepatosplenomegaly, no mass Vascular: No leg edema   Lab Results:  Lab Results  Component Value Date   WBC 7.1 03/09/2018   HGB 14.5 03/09/2018   HCT 42.6 03/09/2018   MCV 97.5 03/09/2018   PLT 144 (L) 03/09/2018   NEUTROABS 4.1 03/09/2018    CMP     Component Value Date/Time   NA 141 03/09/2018 0916   NA 140 12/08/2017 0909   K 5.1 03/09/2018 0916   K 4.6 12/08/2017 0909   CL 105 03/09/2018 0916   CL 102 02/09/2013 0919   CO2 26 03/09/2018 0916   CO2 28 12/08/2017 0909   GLUCOSE 93 03/09/2018 0916   GLUCOSE 94 12/08/2017 0909   GLUCOSE 101 (H) 02/09/2013 0919   BUN 20 03/09/2018 0916   BUN 26.3 (H) 12/08/2017 0909   CREATININE 0.95 03/09/2018 0916   CREATININE 1.0 12/08/2017 0909   CALCIUM 9.5 03/09/2018 0916   CALCIUM 9.2 12/08/2017 0909   PROT 6.7 03/09/2018 0916   PROT 6.8 12/08/2017 0909   ALBUMIN 3.8 03/09/2018 0916   ALBUMIN 4.1 12/08/2017 0909   AST 24 03/09/2018 0916   AST 19 12/08/2017 0909   ALT 16 03/09/2018 0916   ALT 27 12/08/2017 0909   ALKPHOS 109 03/09/2018 0916   ALKPHOS 84 12/08/2017 0909   BILITOT 1.0 03/09/2018 0916   BILITOT 1.08 12/08/2017 0909   GFRNONAA 55 (L) 03/09/2018 0916   GFRAA >60 03/09/2018 0916     Medications: I have reviewed  the patient's current medications.   Assessment/Plan: 1. 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 Status post weekly Rituxan x4 03/01/2012 through 03/22/2012. She completed 4 "maintenance "doses of Rituxan, last on 12/19/2012. A restaging CT on 02/09/2013 showed no evidence of lymphoma.   Lymph node lateral to the thyroid bed on a neck ultrasound 02/21/2014, status post an FNA biopsy concerning for a lymphoproliferative disorder.  PET scan 09/28/2016 with active lymphoma within the neck, chest, abdomen, pelvis; splenic enlargement and hypermetabolism suspicious for splenic involvement.  Initiation of Rituxan weekly 4 09/29/2016  Initiation of maintenance Rituxan on a 3 month schedule 12/23/2016 2. Stage IV (T1bN1b M0) papillary thyroid cancer, status post a thyroidectomy with reimplantation of the left superior parathyroid gland on 05/23/2012, status post radioactive iodine therapy, followed by Dr. Buddy Duty.  3. Stage II (T3 N0) colon cancer, status post a right colectomy 10/19/2011, last colonoscopy April 2015-sigmoid adenoma removed.  4. History of a pulmonary embolism December 2012.  5. History of Atrial fibrillation 6. Iron deficiency anemia-new 03/18/2014. Hemoccult positive stool. The anemia corrected with iron. No longer taking iron.  Status post an upper endoscopy and colonoscopy by Dr. Carlean Purl April 2015 with no bleeding source identified, benign adenoma removed from the  sigmoid colon. 7. Report of an upper gastric intestinal bleed fall 2016-managed in [place]. [name] 8. Left knee replacement May 2017 , repeat left knee surgery May 2018 9. Pruritic rash 07/22/2016-persistent    Disposition: Tracy Mclaughlin is in clinical remission from non-Hodgkin's lymphoma.  The plan is to continue every 52-month rituximab.  She requests rapid rituximab infusion today.  We will discussed this with  the Cancer center pharmacy.  [name] will return for an office visit and rituximab in 3 months.  15 minutes were spent with the patient today.  The majority of the time was used for counseling and coordination of care.  Betsy Coder, MD  03/09/2018  10:32 AM

## 2018-03-09 NOTE — Patient Instructions (Signed)
[facility] Discharge Instructions for Patients Receiving Chemotherapy  Today you received the following chemotherapy agents:  Rituxan.  To help prevent nausea and vomiting after your treatment, we encourage you to take your nausea medication as directed.   If you develop nausea and vomiting that is not controlled by your nausea medication, call the clinic.   BELOW ARE SYMPTOMS THAT SHOULD BE REPORTED IMMEDIATELY:  *FEVER GREATER THAN 100.5 F  *CHILLS WITH OR WITHOUT FEVER  NAUSEA AND VOMITING THAT IS NOT CONTROLLED WITH YOUR NAUSEA MEDICATION  *UNUSUAL SHORTNESS OF BREATH  *UNUSUAL BRUISING OR BLEEDING  TENDERNESS IN MOUTH AND THROAT WITH OR WITHOUT PRESENCE OF ULCERS  *URINARY PROBLEMS  *BOWEL PROBLEMS  UNUSUAL RASH Items with * indicate a potential emergency and should be followed up as soon as possible.  Feel free to call the clinic should you have any questions or concerns. The clinic phone number is [phone].  Please show the CHEMO ALERT CARD at check-in to the Emergency Department and triage nurse.   

## 2018-06-08 ENCOUNTER — Inpatient Hospital Stay: Payer: Medicare Other

## 2018-06-08 ENCOUNTER — Telehealth: Payer: Self-pay

## 2018-06-08 ENCOUNTER — Inpatient Hospital Stay (HOSPITAL_BASED_OUTPATIENT_CLINIC_OR_DEPARTMENT_OTHER): Payer: Medicare Other | Admitting: Oncology

## 2018-06-08 ENCOUNTER — Inpatient Hospital Stay: Payer: Medicare Other | Attending: Oncology

## 2018-06-08 VITALS — BP 127/62 | HR 77 | Temp 98.3°F | Resp 18 | Ht 68.0 in | Wt 229.0 lb

## 2018-06-08 DIAGNOSIS — I4891 Unspecified atrial fibrillation: Secondary | ICD-10-CM | POA: Diagnosis not present

## 2018-06-08 DIAGNOSIS — Z86711 Personal history of pulmonary embolism: Secondary | ICD-10-CM | POA: Diagnosis not present

## 2018-06-08 DIAGNOSIS — C8267 Cutaneous follicle center lymphoma, spleen: Secondary | ICD-10-CM | POA: Diagnosis not present

## 2018-06-08 DIAGNOSIS — R0609 Other forms of dyspnea: Secondary | ICD-10-CM | POA: Diagnosis not present

## 2018-06-08 DIAGNOSIS — C8518 Unspecified B-cell lymphoma, lymph nodes of multiple sites: Secondary | ICD-10-CM

## 2018-06-08 DIAGNOSIS — I499 Cardiac arrhythmia, unspecified: Secondary | ICD-10-CM | POA: Diagnosis not present

## 2018-06-08 DIAGNOSIS — Z85038 Personal history of other malignant neoplasm of large intestine: Secondary | ICD-10-CM

## 2018-06-08 DIAGNOSIS — Z5112 Encounter for antineoplastic immunotherapy: Secondary | ICD-10-CM | POA: Diagnosis not present

## 2018-06-08 DIAGNOSIS — C8261 Cutaneous follicle center lymphoma, lymph nodes of head, face, and neck: Secondary | ICD-10-CM | POA: Diagnosis present

## 2018-06-08 DIAGNOSIS — R5383 Other fatigue: Secondary | ICD-10-CM

## 2018-06-08 DIAGNOSIS — C8510 Unspecified B-cell lymphoma, unspecified site: Secondary | ICD-10-CM

## 2018-06-08 DIAGNOSIS — C8590 Non-Hodgkin lymphoma, unspecified, unspecified site: Secondary | ICD-10-CM

## 2018-06-08 DIAGNOSIS — Z85820 Personal history of malignant melanoma of skin: Secondary | ICD-10-CM | POA: Diagnosis not present

## 2018-06-08 LAB — CMP (CANCER CENTER ONLY)
ALK PHOS: 124 U/L (ref 40–150)
ALT: 11 U/L (ref 0–55)
ANION GAP: 7 (ref 3–11)
AST: 23 U/L (ref 5–34)
Albumin: 4.1 g/dL (ref 3.5–5.0)
BUN: 23 mg/dL (ref 7–26)
CALCIUM: 9.8 mg/dL (ref 8.4–10.4)
CHLORIDE: 105 mmol/L (ref 98–109)
CO2: 27 mmol/L (ref 22–29)
Creatinine: 0.97 mg/dL (ref 0.60–1.10)
GFR, Estimated: 53 mL/min — ABNORMAL LOW (ref >60)
Glucose, Bld: 105 mg/dL (ref 70–140)
Potassium: 4.7 mmol/L (ref 3.5–5.1)
SODIUM: 139 mmol/L (ref 136–145)
Total Bilirubin: 1.1 mg/dL (ref 0.2–1.2)
Total Protein: 6.9 g/dL (ref 6.4–8.3)

## 2018-06-08 LAB — CBC WITH DIFFERENTIAL (CANCER CENTER ONLY)
BASOS ABS: 0.1 10*3/uL (ref 0.0–0.1)
BASOS PCT: 1 %
Eosinophils Absolute: 0.7 10*3/uL — ABNORMAL HIGH (ref 0.0–0.5)
Eosinophils Relative: 7 %
HEMATOCRIT: 43.5 % (ref 34.8–46.6)
HEMOGLOBIN: 14.8 g/dL (ref 11.6–15.9)
Lymphocytes Relative: 23 %
Lymphs Abs: 2.5 10*3/uL (ref 0.9–3.3)
MCH: 33.7 pg (ref 25.1–34.0)
MCHC: 34 g/dL (ref 31.5–36.0)
MCV: 99 fL (ref 79.5–101.0)
Monocytes Absolute: 0.9 10*3/uL (ref 0.1–0.9)
Monocytes Relative: 8 %
NEUTROS ABS: 6.9 10*3/uL — AB (ref 1.5–6.5)
NEUTROS PCT: 61 %
Platelet Count: 145 10*3/uL (ref 145–400)
RBC: 4.39 MIL/uL (ref 3.70–5.45)
RDW: 13.3 % (ref 11.2–14.5)
WBC: 11.1 10*3/uL — AB (ref 3.9–10.3)

## 2018-06-08 MED ORDER — METHYLPREDNISOLONE SODIUM SUCC 125 MG IJ SOLR
INTRAMUSCULAR | Status: AC
  Filled 2018-06-08: qty 2

## 2018-06-08 MED ORDER — ACETAMINOPHEN 325 MG PO TABS
650.0000 mg | ORAL_TABLET | Freq: Once | ORAL | Status: AC
  Administered 2018-06-08: 650 mg via ORAL

## 2018-06-08 MED ORDER — FAMOTIDINE IN NACL 20-0.9 MG/50ML-% IV SOLN
INTRAVENOUS | Status: AC
  Filled 2018-06-08: qty 50

## 2018-06-08 MED ORDER — ACETAMINOPHEN 325 MG PO TABS
ORAL_TABLET | ORAL | Status: AC
  Filled 2018-06-08: qty 2

## 2018-06-08 MED ORDER — SODIUM CHLORIDE 0.9 % IV SOLN
Freq: Once | INTRAVENOUS | Status: AC
  Administered 2018-06-08: 11:00:00 via INTRAVENOUS

## 2018-06-08 MED ORDER — DIPHENHYDRAMINE HCL 25 MG PO CAPS
50.0000 mg | ORAL_CAPSULE | Freq: Once | ORAL | Status: AC
  Administered 2018-06-08: 50 mg via ORAL

## 2018-06-08 MED ORDER — SODIUM CHLORIDE 0.9 % IV SOLN
375.0000 mg/m2 | Freq: Once | INTRAVENOUS | Status: AC
  Administered 2018-06-08: 800 mg via INTRAVENOUS
  Filled 2018-06-08: qty 50

## 2018-06-08 MED ORDER — FAMOTIDINE IN NACL 20-0.9 MG/50ML-% IV SOLN
20.0000 mg | Freq: Two times a day (BID) | INTRAVENOUS | Status: DC
  Administered 2018-06-08: 20 mg via INTRAVENOUS

## 2018-06-08 MED ORDER — DIPHENHYDRAMINE HCL 25 MG PO CAPS
ORAL_CAPSULE | ORAL | Status: AC
  Filled 2018-06-08: qty 2

## 2018-06-08 MED ORDER — METHYLPREDNISOLONE SODIUM SUCC 125 MG IJ SOLR
125.0000 mg | Freq: Once | INTRAMUSCULAR | Status: AC
  Administered 2018-06-08: 125 mg via INTRAVENOUS

## 2018-06-08 NOTE — Patient Instructions (Signed)
[facility] Discharge Instructions for Patients Receiving Chemotherapy  Today you received the following chemotherapy agents:  Rituxan.  To help prevent nausea and vomiting after your treatment, we encourage you to take your nausea medication as directed.   If you develop nausea and vomiting that is not controlled by your nausea medication, call the clinic.   BELOW ARE SYMPTOMS THAT SHOULD BE REPORTED IMMEDIATELY:  *FEVER GREATER THAN 100.5 F  *CHILLS WITH OR WITHOUT FEVER  NAUSEA AND VOMITING THAT IS NOT CONTROLLED WITH YOUR NAUSEA MEDICATION  *UNUSUAL SHORTNESS OF BREATH  *UNUSUAL BRUISING OR BLEEDING  TENDERNESS IN MOUTH AND THROAT WITH OR WITHOUT PRESENCE OF ULCERS  *URINARY PROBLEMS  *BOWEL PROBLEMS  UNUSUAL RASH Items with * indicate a potential emergency and should be followed up as soon as possible.  Feel free to call the clinic should you have any questions or concerns. The clinic phone number is [phone].  Please show the CHEMO ALERT CARD at check-in to the Emergency Department and triage nurse.   

## 2018-06-08 NOTE — Addendum Note (Signed)
Addended by: Betsy Coder B on: 06/08/2018 11:37 AM   Modules accepted: Orders

## 2018-06-08 NOTE — Telephone Encounter (Signed)
Printed avs and calender of upcoming appointment. Per 6/20 los 

## 2018-06-08 NOTE — Progress Notes (Signed)
[facility] OFFICE PROGRESS NOTE   Diagnosis: Non-Hodgkin's lymphoma  INTERVAL HISTORY:   Tracy Mclaughlin returns as scheduled.  She completed another cycle of rituximab on 03/09/2018.  She reports tolerating the rituximab without an allergic reaction. She complains of fatigue.  She relates this to a decrease in the thyroid hormone dose.  She has dyspnea with exertion.  No fever.  Good appetite.  No palpable lymph nodes.  Objective:  Vital signs in last 24 hours:  Blood pressure 127/62, pulse 77, temperature 98.3 F (36.8 C), temperature source Oral, resp. rate 18, height 5\' 8"  (1.727 m), weight 229 lb (103.9 kg), SpO2 98 %.    HEENT: Neck without mass Lymphatics: No cervical, supraclavicular, or inguinal nodes.  Prominent left greater than right axillary fat pad. Resp: Lungs clear bilaterally Cardio: Irregular GI: No hepatosplenomegaly Vascular: No leg edema      Lab Results:  Lab Results  Component Value Date   WBC 11.1 (H) 06/08/2018   HGB 14.8 06/08/2018   HCT 43.5 06/08/2018   MCV 99.0 06/08/2018   PLT 145 06/08/2018   NEUTROABS 6.9 (H) 06/08/2018    CMP  Lab Results  Component Value Date   NA 139 06/08/2018   K 4.7 06/08/2018   CL 105 06/08/2018   CO2 27 06/08/2018   GLUCOSE 105 06/08/2018   BUN 23 06/08/2018   CREATININE 0.97 06/08/2018   CALCIUM 9.8 06/08/2018   PROT 6.9 06/08/2018   ALBUMIN 4.1 06/08/2018   AST 23 06/08/2018   ALT 11 06/08/2018   ALKPHOS 124 06/08/2018   BILITOT 1.1 06/08/2018   GFRNONAA 53 (L) 06/08/2018   GFRAA >60 06/08/2018    Medications: I have reviewed the patient's current medications.   Assessment/Plan:  1. 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 Status post weekly Rituxan x4 03/01/2012 through 03/22/2012. She completed 4 "maintenance "doses of Rituxan, last on 12/19/2012. A restaging CT on  02/09/2013 showed no evidence of lymphoma.   Lymph node lateral to the thyroid bed on a neck ultrasound 02/21/2014, status post an FNA biopsy concerning for a lymphoproliferative disorder.  PET scan 09/28/2016 with active lymphoma within the neck, chest, abdomen, pelvis; splenic enlargement and hypermetabolism suspicious for splenic involvement.  Initiation of Rituxan weekly 4 09/29/2016  Initiation of maintenance Rituxan on a 3 month schedule 12/23/2016 2. Stage IV (T1bN1b M0) papillary thyroid cancer, status post a thyroidectomy with reimplantation of the left superior parathyroid gland on 05/23/2012, status post radioactive iodine therapy, followed by Dr. Buddy Duty.  3. Stage II (T3 N0) colon cancer, status post a right colectomy 10/19/2011, last colonoscopy April 2015-sigmoid adenoma removed.  4. History of a pulmonary embolism December 2012.  5. History of Atrial fibrillation 6. Iron deficiency anemia-new 03/18/2014. Hemoccult positive stool. The anemia corrected with iron. No longer taking iron.  Status post an upper endoscopy and colonoscopy by Dr. Carlean Purl April 2015 with no bleeding source identified, benign adenoma removed from the sigmoid colon. 7. Report of an upper gastric intestinal bleed fall 2016-managed in [place]. [name] 8. Left knee replacement May 2017 , repeat left knee surgery May 2018 9. Pruritic rash 07/22/2016   Disposition: Tracy Mclaughlin remains in clinical remission from non-Hodgkin's lymphoma.  She will continue maintenance rituximab.  She will return for an office visit and rituximab in 3 months.  We will discuss discontinuing rituximab when she is here in 3 months.  The malaise may be  related to thyroid disease or another etiology.  The heart rate is irregular today, but not fast.  Atrial fibrillation could also explain her malaise.  She declined an EKG today.  She plans to follow-up with Dr. Tamala Julian.  15 minutes were spent with the patient today.  The majority of the time  was used for counseling and coordination of care.  Betsy Coder, MD  06/08/2018  10:27 AM

## 2018-08-27 ENCOUNTER — Other Ambulatory Visit: Payer: Self-pay | Admitting: Oncology

## 2018-08-31 ENCOUNTER — Inpatient Hospital Stay: Payer: Medicare Other

## 2018-08-31 ENCOUNTER — Encounter: Payer: Self-pay | Admitting: Nurse Practitioner

## 2018-08-31 ENCOUNTER — Inpatient Hospital Stay: Payer: Medicare Other | Attending: Oncology | Admitting: Nurse Practitioner

## 2018-08-31 VITALS — BP 127/50 | HR 68 | Temp 98.4°F | Resp 18 | Ht 68.0 in | Wt 231.9 lb

## 2018-08-31 VITALS — BP 135/48 | HR 67 | Temp 98.0°F | Resp 16

## 2018-08-31 DIAGNOSIS — C8518 Unspecified B-cell lymphoma, lymph nodes of multiple sites: Secondary | ICD-10-CM

## 2018-08-31 DIAGNOSIS — Z86711 Personal history of pulmonary embolism: Secondary | ICD-10-CM | POA: Insufficient documentation

## 2018-08-31 DIAGNOSIS — C8261 Cutaneous follicle center lymphoma, lymph nodes of head, face, and neck: Secondary | ICD-10-CM

## 2018-08-31 DIAGNOSIS — C8510 Unspecified B-cell lymphoma, unspecified site: Secondary | ICD-10-CM

## 2018-08-31 DIAGNOSIS — Z87891 Personal history of nicotine dependence: Secondary | ICD-10-CM

## 2018-08-31 DIAGNOSIS — C8267 Cutaneous follicle center lymphoma, spleen: Secondary | ICD-10-CM | POA: Insufficient documentation

## 2018-08-31 DIAGNOSIS — R5383 Other fatigue: Secondary | ICD-10-CM | POA: Insufficient documentation

## 2018-08-31 DIAGNOSIS — Z5112 Encounter for antineoplastic immunotherapy: Secondary | ICD-10-CM | POA: Insufficient documentation

## 2018-08-31 DIAGNOSIS — I4891 Unspecified atrial fibrillation: Secondary | ICD-10-CM | POA: Insufficient documentation

## 2018-08-31 LAB — CBC WITH DIFFERENTIAL (CANCER CENTER ONLY)
BASOS ABS: 0.1 10*3/uL (ref 0.0–0.1)
Basophils Relative: 1 %
EOS ABS: 0.7 10*3/uL — AB (ref 0.0–0.5)
EOS PCT: 8 %
HCT: 45 % (ref 34.8–46.6)
Hemoglobin: 15.3 g/dL (ref 11.6–15.9)
LYMPHS ABS: 3.2 10*3/uL (ref 0.9–3.3)
Lymphocytes Relative: 37 %
MCH: 33.8 pg (ref 25.1–34.0)
MCHC: 34 g/dL (ref 31.5–36.0)
MCV: 99.6 fL (ref 79.5–101.0)
Monocytes Absolute: 0.7 10*3/uL (ref 0.1–0.9)
Monocytes Relative: 9 %
Neutro Abs: 3.9 10*3/uL (ref 1.5–6.5)
Neutrophils Relative %: 45 %
PLATELETS: 134 10*3/uL — AB (ref 145–400)
RBC: 4.52 MIL/uL (ref 3.70–5.45)
RDW: 13.2 % (ref 11.2–14.5)
WBC: 8.5 10*3/uL (ref 3.9–10.3)

## 2018-08-31 MED ORDER — SODIUM CHLORIDE 0.9 % IV SOLN
Freq: Once | INTRAVENOUS | Status: AC
  Administered 2018-08-31: 11:00:00 via INTRAVENOUS
  Filled 2018-08-31: qty 250

## 2018-08-31 MED ORDER — DIPHENHYDRAMINE HCL 25 MG PO CAPS
ORAL_CAPSULE | ORAL | Status: AC
  Filled 2018-08-31: qty 2

## 2018-08-31 MED ORDER — DIPHENHYDRAMINE HCL 25 MG PO CAPS
50.0000 mg | ORAL_CAPSULE | Freq: Once | ORAL | Status: AC
  Administered 2018-08-31: 50 mg via ORAL

## 2018-08-31 MED ORDER — METHYLPREDNISOLONE SODIUM SUCC 125 MG IJ SOLR
125.0000 mg | Freq: Once | INTRAMUSCULAR | Status: AC
  Administered 2018-08-31: 125 mg via INTRAVENOUS

## 2018-08-31 MED ORDER — ACETAMINOPHEN 325 MG PO TABS
ORAL_TABLET | ORAL | Status: AC
  Filled 2018-08-31: qty 2

## 2018-08-31 MED ORDER — FAMOTIDINE IN NACL 20-0.9 MG/50ML-% IV SOLN
20.0000 mg | Freq: Two times a day (BID) | INTRAVENOUS | Status: DC
  Administered 2018-08-31: 20 mg via INTRAVENOUS

## 2018-08-31 MED ORDER — FAMOTIDINE IN NACL 20-0.9 MG/50ML-% IV SOLN
INTRAVENOUS | Status: AC
  Filled 2018-08-31: qty 50

## 2018-08-31 MED ORDER — ACETAMINOPHEN 325 MG PO TABS
650.0000 mg | ORAL_TABLET | Freq: Once | ORAL | Status: AC
  Administered 2018-08-31: 650 mg via ORAL

## 2018-08-31 MED ORDER — METHYLPREDNISOLONE SODIUM SUCC 125 MG IJ SOLR
INTRAMUSCULAR | Status: AC
  Filled 2018-08-31: qty 2

## 2018-08-31 MED ORDER — SODIUM CHLORIDE 0.9 % IV SOLN
375.0000 mg/m2 | Freq: Once | INTRAVENOUS | Status: AC
  Administered 2018-08-31: 800 mg via INTRAVENOUS
  Filled 2018-08-31: qty 50

## 2018-08-31 NOTE — Progress Notes (Addendum)
[facility] OFFICE PROGRESS NOTE   Diagnosis: Non-Hodgkin's lymphoma  INTERVAL HISTORY:   Tracy Mclaughlin returns as scheduled.  She completed another cycle of every 3 month rituximab 06/08/2018.  She overall feels well.  No interim illnesses or infections.  No fevers or sweats.  No enlarged lymph nodes.  Appetite is unchanged.  Main complaint is fatigue.  She wonders if the fatigue is related to her Synthroid dose.  She plans to discuss this with Dr. Buddy Duty at her next visit.  Objective:  Vital signs in last 24 hours:  Blood pressure (!) 127/50, pulse 68, temperature 98.4 F (36.9 C), temperature source Oral, resp. rate 18, height 5\' 8"  (1.727 m), weight 231 lb 14.4 oz (105.2 kg), SpO2 97 %.    HEENT: No thrush or ulcers. Lymphatics: No palpable cervical, supraclavicular or axillary lymph nodes. Resp: Lungs clear bilaterally. Cardio: Regular rate and rhythm. GI: Abdomen soft and nontender.  No hepatospleno megaly. Vascular: No leg edema.    Lab Results:  Lab Results  Component Value Date   WBC 8.5 08/31/2018   HGB 15.3 08/31/2018   HCT 45.0 08/31/2018   MCV 99.6 08/31/2018   PLT 134 (L) 08/31/2018   NEUTROABS 3.9 08/31/2018    Imaging:  No results found.  Medications: I have reviewed the patient's current medications.  Assessment/Plan: 1. 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 Status post weekly Rituxan x4 03/01/2012 through 03/22/2012. She completed 4 "maintenance "doses of Rituxan, last on 12/19/2012. A restaging CT on 02/09/2013 showed no evidence of lymphoma.   Lymph node lateral to the thyroid bed on a neck ultrasound 02/21/2014, status post an FNA biopsy concerning for a lymphoproliferative disorder.  PET scan 09/28/2016 with active lymphoma within the neck, chest, abdomen, pelvis; splenic enlargement and hypermetabolism suspicious for splenic  involvement.  Initiation of Rituxan weekly 4 09/29/2016  Initiation of maintenance Rituxan on a 3 month schedule 12/23/2016; final Rituxan 08/31/2018 2. Stage IV (T1bN1b M0) papillary thyroid cancer, status post a thyroidectomy with reimplantation of the left superior parathyroid gland on 05/23/2012, status post radioactive iodine therapy, followed by Dr. Buddy Duty.  3. Stage II (T3 N0) colon cancer, status post a right colectomy 10/19/2011, last colonoscopy April 2015-sigmoid adenoma removed.  4. History of a pulmonary embolism December 2012.  5. History of Atrial fibrillation 6. Iron deficiency anemia-new 03/18/2014. Hemoccult positive stool. The anemia corrected with iron. No longer taking iron.  Status post an upper endoscopy and colonoscopy by Dr. Carlean Purl April 2015 with no bleeding source identified, benign adenoma removed from the sigmoid colon. 7. Report of an upper gastric intestinal bleed fall 2016-managed in [place]. [name] 8. Left knee replacement May 2017, repeat left knee surgery May 2018 9. Pruritic rash 07/22/2016    Disposition: Tracy Mclaughlin appears stable.  She remains in clinical remission from non-Hodgkin's lymphoma.  She will receive rituximab today as scheduled.  Rituxan will then be placed on hold and we will follow with observation.  She will return for lab and follow-up in 4 months.  She will receive an influenza vaccine today.  Patient seen with Dr. Benay Spice.    Ned Card ANP/GNP-BC   08/31/2018  9:57 AM This was a shared visit with Ned Card.  Tracy Mclaughlin has been treated with maintenance rituximab for almost 2 years.  She is in clinical remission from the low-grade lymphoma.  She will complete another treatment with rituximab today and then  be followed with observation.  Julieanne Manson, MD

## 2018-08-31 NOTE — Patient Instructions (Signed)
[facility] Discharge Instructions for Patients Receiving Chemotherapy  Today you received the following chemotherapy agents:  Rituxan.  To help prevent nausea and vomiting after your treatment, we encourage you to take your nausea medication as directed.   If you develop nausea and vomiting that is not controlled by your nausea medication, call the clinic.   BELOW ARE SYMPTOMS THAT SHOULD BE REPORTED IMMEDIATELY:  *FEVER GREATER THAN 100.5 F  *CHILLS WITH OR WITHOUT FEVER  NAUSEA AND VOMITING THAT IS NOT CONTROLLED WITH YOUR NAUSEA MEDICATION  *UNUSUAL SHORTNESS OF BREATH  *UNUSUAL BRUISING OR BLEEDING  TENDERNESS IN MOUTH AND THROAT WITH OR WITHOUT PRESENCE OF ULCERS  *URINARY PROBLEMS  *BOWEL PROBLEMS  UNUSUAL RASH Items with * indicate a potential emergency and should be followed up as soon as possible.  Feel free to call the clinic should you have any questions or concerns. The clinic phone number is [phone].  Please show the CHEMO ALERT CARD at check-in to the Emergency Department and triage nurse.   

## 2018-09-01 ENCOUNTER — Telehealth: Payer: Self-pay | Admitting: Nurse Practitioner

## 2018-09-01 NOTE — Telephone Encounter (Signed)
Scheduled appt per 9/12 los - sent reminder letter in the mail with appt date and time - per los.

## 2019-01-01 ENCOUNTER — Telehealth: Payer: Self-pay | Admitting: Oncology

## 2019-01-01 ENCOUNTER — Inpatient Hospital Stay: Payer: Medicare PPO

## 2019-01-01 ENCOUNTER — Inpatient Hospital Stay: Payer: Medicare PPO | Attending: Oncology | Admitting: Oncology

## 2019-01-01 VITALS — BP 122/50 | HR 77 | Temp 97.8°F | Resp 17 | Ht 68.0 in | Wt 229.4 lb

## 2019-01-01 DIAGNOSIS — I4891 Unspecified atrial fibrillation: Secondary | ICD-10-CM | POA: Insufficient documentation

## 2019-01-01 DIAGNOSIS — C8267 Cutaneous follicle center lymphoma, spleen: Secondary | ICD-10-CM | POA: Diagnosis present

## 2019-01-01 DIAGNOSIS — Z86711 Personal history of pulmonary embolism: Secondary | ICD-10-CM | POA: Diagnosis not present

## 2019-01-01 DIAGNOSIS — R635 Abnormal weight gain: Secondary | ICD-10-CM | POA: Diagnosis not present

## 2019-01-01 DIAGNOSIS — C8261 Cutaneous follicle center lymphoma, lymph nodes of head, face, and neck: Secondary | ICD-10-CM | POA: Insufficient documentation

## 2019-01-01 DIAGNOSIS — R5383 Other fatigue: Secondary | ICD-10-CM

## 2019-01-01 DIAGNOSIS — C8510 Unspecified B-cell lymphoma, unspecified site: Secondary | ICD-10-CM

## 2019-01-01 DIAGNOSIS — C8518 Unspecified B-cell lymphoma, lymph nodes of multiple sites: Secondary | ICD-10-CM

## 2019-01-01 LAB — CBC WITH DIFFERENTIAL (CANCER CENTER ONLY)
ABS IMMATURE GRANULOCYTES: 0.03 10*3/uL (ref 0.00–0.07)
BASOS ABS: 0.1 10*3/uL (ref 0.0–0.1)
BASOS PCT: 1 %
EOS ABS: 0.5 10*3/uL (ref 0.0–0.5)
Eosinophils Relative: 5 %
HEMATOCRIT: 44.4 % (ref 36.0–46.0)
Hemoglobin: 15.1 g/dL — ABNORMAL HIGH (ref 12.0–15.0)
IMMATURE GRANULOCYTES: 0 %
Lymphocytes Relative: 38 %
Lymphs Abs: 3.8 10*3/uL (ref 0.7–4.0)
MCH: 33.8 pg (ref 26.0–34.0)
MCHC: 34 g/dL (ref 30.0–36.0)
MCV: 99.3 fL (ref 80.0–100.0)
MONO ABS: 1 10*3/uL (ref 0.1–1.0)
MONOS PCT: 9 %
NEUTROS PCT: 47 %
Neutro Abs: 4.8 10*3/uL (ref 1.7–7.7)
PLATELETS: 159 10*3/uL (ref 150–400)
RBC: 4.47 MIL/uL (ref 3.87–5.11)
RDW: 12.7 % (ref 11.5–15.5)
WBC: 10.2 10*3/uL (ref 4.0–10.5)
nRBC: 0 % (ref 0.0–0.2)

## 2019-01-01 NOTE — Progress Notes (Signed)
Reports feeling depressed: Has no interest in eating or cooking and she used to love to cook. Wants to sleep all the time, no interest in anything. Very fatigued and poorly motivated to do anything. Has been on the same antidepressant for years. Plans to speak with her PCP about the depression at her appointment this month. Asked patient if she felt suicidal and she reports "of course not". Strongly encouraged her to get help and she agreed. Her clothes today are sweats shirt/pants and have stains on them. Affect is dull.

## 2019-01-01 NOTE — Telephone Encounter (Signed)
Printed calendar and avs, per patient request.

## 2019-01-01 NOTE — Progress Notes (Signed)
[place] OFFICE PROGRESS NOTE   Diagnosis: Non-Hodgkin's lymphoma  INTERVAL HISTORY:   Tracy Mclaughlin returns as scheduled.  She was last treated with rituximab on 08/31/2018.  No fever, night sweats, or anorexia.  No palpable lymph nodes.  She feels "depressed ".  She reports being treated for depression with Celexa for the past 4 years.  She has a lack of interest in activities.  She has noted weight gain since her thyroid hormone dose was adjusted.  Objective:  Vital signs in last 24 hours:  Blood pressure (!) 122/50, pulse 77, temperature 97.8 F (36.6 C), temperature source Oral, resp. rate 17, height 5\' 8"  (1.727 m), weight 229 lb 6.4 oz (104.1 kg), SpO2 94 %.    HEENT: Neck without mass Lymphatics: No cervical, supraclavicular, or inguinal nodes.  Soft oval high left axillary node versus a prominent fat pad similar smaller fullness on the right side Resp: Lungs clear bilaterally Cardio: Regular rate and rhythm GI: No hepatosplenomegaly Vascular: No leg edema  Lab Results:  Lab Results  Component Value Date   WBC 10.2 01/01/2019   HGB 15.1 (H) 01/01/2019   HCT 44.4 01/01/2019   MCV 99.3 01/01/2019   PLT 159 01/01/2019   NEUTROABS 4.8 01/01/2019   Absolute lymphocyte count 3.8 CMP  Lab Results  Component Value Date   NA 139 06/08/2018   K 4.7 06/08/2018   CL 105 06/08/2018   CO2 27 06/08/2018   GLUCOSE 105 06/08/2018   BUN 23 06/08/2018   CREATININE 0.97 06/08/2018   CALCIUM 9.8 06/08/2018   PROT 6.9 06/08/2018   ALBUMIN 4.1 06/08/2018   AST 23 06/08/2018   ALT 11 06/08/2018   ALKPHOS 124 06/08/2018   BILITOT 1.1 06/08/2018   GFRNONAA 53 (L) 06/08/2018   GFRAA >60 06/08/2018    Lab Results  Component Value Date   CEA1 3.16 07/22/2016   CEA1 2.7 07/22/2016    Medications: I have reviewed the patient's current medications.   Assessment/Plan: 1. 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 Status post weekly Rituxan x4 03/01/2012 through 03/22/2012. She completed 4 "maintenance "doses of Rituxan, last on 12/19/2012. A restaging CT on 02/09/2013 showed no evidence of lymphoma.   Lymph node lateral to the thyroid bed on a neck ultrasound 02/21/2014, status post an FNA biopsy concerning for a lymphoproliferative disorder.  PET scan 09/28/2016 with active lymphoma within the neck, chest, abdomen, pelvis; splenic enlargement and hypermetabolism suspicious for splenic involvement.  Initiation of Rituxan weekly 4 09/29/2016  Initiation of maintenance Rituxan on a 3 month schedule 12/23/2016; final Rituxan 08/31/2018 2. Stage IV (T1bN1b M0) papillary thyroid cancer, status post a thyroidectomy with reimplantation of the left superior parathyroid gland on 05/23/2012, status post radioactive iodine therapy, followed by Dr. Buddy Mclaughlin.  3. Stage II (T3 N0) colon cancer, status post a right colectomy 10/19/2011, last colonoscopy April 2015-sigmoid adenoma removed.  4. History of a pulmonary embolism December 2012.  5. History of Atrial fibrillation 6. Iron deficiency anemia-new 03/18/2014. Hemoccult positive stool. The anemia corrected with iron. No longer taking iron.  Status post an upper endoscopy and colonoscopy by Dr. Carlean Mclaughlin April 2015 with no bleeding source identified, benign adenoma removed from the sigmoid colon. 7. Report of an upper gastric intestinal bleed fall 2016-managed in [place]. [name] 8. Left knee replacement May 2017, repeat left knee surgery May 2018 9. Pruritic rash 07/22/2016    Disposition: Ms.  Mclaughlin is in clinical remission from Tracy Mclaughlin.  She will return for an office visit and CBC in 4 months. I doubt the "depression "symptoms are related to the history of lymphoma.  I recommended she follow-up with her primary physician for evaluation of depression.  She plans to see Dr. Buddy Mclaughlin for adjustment of the  thyroid hormone replacement.    Betsy Coder, MD  01/01/2019  12:21 PM

## 2019-01-04 ENCOUNTER — Encounter: Payer: Self-pay | Admitting: Interventional Cardiology

## 2019-01-22 ENCOUNTER — Ambulatory Visit: Payer: Medicare Other | Admitting: Interventional Cardiology

## 2019-01-29 ENCOUNTER — Other Ambulatory Visit (HOSPITAL_COMMUNITY): Payer: Self-pay | Admitting: Internal Medicine

## 2019-01-29 ENCOUNTER — Other Ambulatory Visit: Payer: Self-pay | Admitting: Internal Medicine

## 2019-01-29 DIAGNOSIS — C73 Malignant neoplasm of thyroid gland: Secondary | ICD-10-CM

## 2019-01-29 NOTE — Progress Notes (Addendum)
Cardiology Office Note:    Date:  01/30/2019   ID:  Tracy Mclaughlin, DOB Aug 03, 1934, MRN [id number]  PCP:  Patient, No Pcp Per  Cardiologist:  Sinclair Grooms, MD   Referring MD: No ref. provider found   Chief Complaint  Patient presents with  . Atrial Fibrillation    History of Present Illness:    Tracy Mclaughlin is a 85 y.o. 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  She is doing well.  She denies angina and cardiac complaints.  No episodes of atrial fib.  She has a fullness in the left supraclavicular area that is soft but also there is a firmness that feels like a lymph node.  Admits to being significantly depressed.  Not going outside.  Wants to sleep all the time.  Talking to her primary care physician about her feelings.  Left supraclavicular fullness.  Concerned about what it may represent.  I palpated it and gave her my impression.  Past Medical History:  Diagnosis Date  . Anemia   . Arthritis   . Atrial fibrillation ([place])   . Cancer ([place])    Thyroid/Colon/ B cell Lymphoma-remission  . Diverticulosis   . History of atrial fibrillation    Was in only in a- fib for 2 days  . Hypertension   . Non Hodgkin's lymphoma ([place])    Non-Hodgkin's B-Cell Lymphoma  . Papillary thyroid carcinoma ([place])   . Paroxysmal atrial fibrillation ([facility])   . Post-surgical hypothyroidism   . Pulmonary embolus ([place]) 11/2011  . Subarachnoid hemorrhage ([place]) 1996  . Thyroid disease     Past Surgical History:  Procedure Laterality Date  . APPENDECTOMY  1959  . CHOLECYSTECTOMY  1999   S/P Lap Cholecystectomy  . COLON RESECTION  11/12   mucinous adenocarcinoma of the cecum, resected  . COLONOSCOPY  10/05/2011  . COLONOSCOPY N/A 04/17/2014   Procedure: COLONOSCOPY;  Surgeon:  Gatha Mayer, MD;  Location: [facility];  Service: Endoscopy;  Laterality: N/A;  . ESOPHAGOGASTRODUODENOSCOPY N/A 04/17/2014   Procedure: ESOPHAGOGASTRODUODENOSCOPY (EGD);  Surgeon: Gatha Mayer, MD;  Location: [facility];  Service: Endoscopy;  Laterality: N/A;  . KNEE ARTHROSCOPY Left 1998   S/P  Arthroscopic Left knee surgery  . ROTATOR CUFF REPAIR Right 1998  . SUBDURAL HEMATOMA EVACUATION VIA CRANIOTOMY    . THYROIDECTOMY  05/23/2012   Procedure: THYROIDECTOMY;  Surgeon: [name], MD;  Location: [facility];  Service: General;  Laterality: N/A;  Total thyroidectomy, central compartment lymph node biopsy times two, reimplantation left superior parathyroid gland.  . TUBOPLASTY / TUBOTUBAL ANASTOMOSIS      Current Medications: Current Meds  Medication Sig  . citalopram (CELEXA) 40 MG tablet Take 40 mg by mouth daily.  Marland Kitchen diltiazem (DILACOR XR) 180 MG 24 hr capsule Take 1 capsule (180 mg total) by mouth daily.  . diphenhydrAMINE (BENADRYL) 25 MG tablet Take 25 mg by mouth at bedtime as needed. For itching/sleep  . levothyroxine (SYNTHROID, LEVOTHROID) 175 MCG tablet Take 175 mcg by mouth daily before breakfast.  . losartan (COZAAR) 100 MG tablet Take 100 mg by mouth daily.  . TRINTELLIX 5 MG TABS tablet Take 5 mg by mouth daily.     Allergies:   Patient has no known allergies.   Social History   Socioeconomic History  . Marital  status: Married    Spouse name: Not on file  . Number of children: 0  . Years of education: Not on file  . Highest education level: Not on file  Occupational History  . Occupation: retired  Scientific laboratory technician  . Financial resource strain: Not on file  . Food insecurity:    Worry: Not on file    Inability: Not on file  . Transportation needs:    Medical: Not on file    Non-medical: Not on file  Tobacco Use  . Smoking status: Former Smoker    Packs/day: 1.00    Years: 12.00    Pack years: 12.00    Types: Cigarettes    Last attempt to quit:  08/23/1969    Years since quitting: 49.4  . Smokeless tobacco: Never Used  Substance and Sexual Activity  . Alcohol use: Yes    Comment: 1 manhattens per night  . Drug use: No  . Sexual activity: Not on file  Lifestyle  . Physical activity:    Days per week: Not on file    Minutes per session: Not on file  . Stress: Not on file  Relationships  . Social connections:    Talks on phone: Not on file    Gets together: Not on file    Attends religious service: Not on file    Active member of club or organization: Not on file    Attends meetings of clubs or organizations: Not on file    Relationship status: Not on file  Other Topics Concern  . Not on file  Social History Narrative  . Not on file     Family History: The patient's family history includes Coronary artery disease in her mother; Diabetes in her mother and sister; Hypertension in her mother and sister; Kidney cancer in her sister; Lung cancer in her brother; Stroke in her sister. There is no history of Anesthesia problems or Heart attack.  ROS:   Please see the history of present illness.    Hearing loss, vision disturbance, diarrhea, depression, back pain, rash, difficulty with balance, snoring.  All other systems reviewed and are negative.  EKGs/Labs/Other Studies Reviewed:    The following studies were reviewed today: No new data  EKG:  EKG normal sinus rhythm, heart rate 83 bpm, left axis deviation, nonspecific T wave flattening.  Recent Labs: 06/08/2018: ALT 11; BUN 23; Creatinine 0.97; Potassium 4.7; Sodium 139 01/01/2019: Hemoglobin 15.1; Platelet Count 159  Recent Lipid Panel No results found for: CHOL, TRIG, HDL, CHOLHDL, VLDL, LDLCALC, LDLDIRECT  Physical Exam:    VS:  BP 122/68   Pulse 83   Ht 5\' 8"  (1.727 m)   Wt 229 lb 10.1 oz (104.2 kg)   SpO2 95%   BMI 34.92 kg/m     Wt Readings from Last 3 Encounters:  01/30/19 229 lb 10.1 oz (104.2 kg)  01/01/19 229 lb 6.4 oz (104.1 kg)  08/31/18 231 lb  14.4 oz (105.2 kg)     GEN: Obese, appears sad.. No acute distress HEENT: Normal NECK: No JVD.  There is left supraclavicular fullness.  Deeper there is a firm mobile lymph node. LYMPHATICS: Left supraclavicular adenopathy within a region of lipoma. CARDIAC: RRR.  No murmur, no gallop, no edema VASCULAR: 2+ bilateral symmetric radial and carotid pulses, no bruits RESPIRATORY:  Clear to auscultation without rales, wheezing or rhonchi  ABDOMEN: Soft, non-tender, non-distended, No pulsatile mass, MUSCULOSKELETAL: No deformity  SKIN: Warm and dry NEUROLOGIC:  Alert and  oriented x 3 PSYCHIATRIC:  Normal affect   ASSESSMENT:    1. PAF (paroxysmal atrial fibrillation) ([place])   2. Other pulmonary embolism without acute cor pulmonale, unspecified chronicity ([place])   3. Essential hypertension   4. Subarachnoid hemorrhage ([place])   5. Supraclavicular adenopathy    PLAN:    In order of problems listed above:  1. No episodes of atrial fibrillation.  Taken her medications as prescribed.  Not on antiarrhythmic therapy due to prior subarachnoid hemorrhage. 2. No recurrence of pulmonary embolism or complaints to suggest pulmonary hypertension 3. Excellent blood pressure control.  Low-salt diet is reviewed. 4. Not addressed. 5. Noted in the left supraclavicular region within what appears to be a lipoma.  Rule out adenopathy.  This is especially important in the setting of prior lymphoma.  Stable without recurrent atrial fibrillation.  No change in therapy.  Continue Del[place]ort to control rate if A. fib were to recur.  No anticoagulation therapy due to history of subarachnoid bleed.   Medication Adjustments/Labs and Tests Ordered: Current medicines are reviewed at length with the patient today.  Concerns regarding medicines are outlined above.  Orders Placed This Encounter  Procedures  . EKG 12-Lead   No orders of the defined types were placed in this encounter.   Patient Instructions    Medication Instructions:  Your physician recommends that you continue on your current medications as directed. Please refer to the Current Medication list given to you today.  If you need a refill on your cardiac medications before your next appointment, please call your pharmacy.   Lab work: None If you have labs (blood work) drawn today and your tests are completely normal, you will receive your results only by: Marland Kitchen MyChart Message (if you have MyChart) OR . A paper copy in the mail If you have any lab test that is abnormal or we need to change your treatment, we will call you to review the results.  Testing/Procedures: None  Follow-Up: At [facility], you and your health needs are our priority.  As part of our continuing mission to provide you with exceptional heart care, we have created designated Provider Care Teams.  These Care Teams include your primary Cardiologist (physician) and Advanced Practice Providers (APPs -  Physician Assistants and Nurse Practitioners) who all work together to provide you with the care you need, when you need it. You will need a follow up appointment in 12 months.  Please call our office 2 months in advance to schedule this appointment.  You may see Sinclair Grooms, MD or one of the following Advanced Practice Providers on your designated Care Team:   Truitt Merle, NP Cecilie Kicks, NP . Kathyrn Drown, NP  Any Other Special Instructions Will Be Listed Below (If Applicable).       Signed, Sinclair Grooms, MD  01/30/2019 6:25 PM    [place]

## 2019-01-30 ENCOUNTER — Ambulatory Visit: Payer: Medicare PPO | Admitting: Interventional Cardiology

## 2019-01-30 ENCOUNTER — Encounter: Payer: Self-pay | Admitting: Interventional Cardiology

## 2019-01-30 VITALS — BP 122/68 | HR 83 | Ht 68.0 in | Wt 229.6 lb

## 2019-01-30 DIAGNOSIS — I10 Essential (primary) hypertension: Secondary | ICD-10-CM | POA: Diagnosis not present

## 2019-01-30 DIAGNOSIS — I609 Nontraumatic subarachnoid hemorrhage, unspecified: Secondary | ICD-10-CM | POA: Diagnosis not present

## 2019-01-30 DIAGNOSIS — I2699 Other pulmonary embolism without acute cor pulmonale: Secondary | ICD-10-CM | POA: Diagnosis not present

## 2019-01-30 DIAGNOSIS — I480 Paroxysmal atrial fibrillation: Secondary | ICD-10-CM | POA: Diagnosis not present

## 2019-01-30 DIAGNOSIS — R590 Localized enlarged lymph nodes: Secondary | ICD-10-CM

## 2019-01-30 NOTE — Patient Instructions (Signed)
Medication Instructions:  Your physician recommends that you continue on your current medications as directed. Please refer to the Current Medication list given to you today.  If you need a refill on your cardiac medications before your next appointment, please call your pharmacy.   Lab work: None If you have labs (blood work) drawn today and your tests are completely normal, you will receive your results only by: . MyChart Message (if you have MyChart) OR . A paper copy in the mail If you have any lab test that is abnormal or we need to change your treatment, we will call you to review the results.  Testing/Procedures: None  Follow-Up: At [facility], you and your health needs are our priority.  As part of our continuing mission to provide you with exceptional heart care, we have created designated Provider Care Teams.  These Care Teams include your primary Cardiologist (physician) and Advanced Practice Providers (APPs -  Physician Assistants and Nurse Practitioners) who all work together to provide you with the care you need, when you need it. You will need a follow up appointment in 12 months.  Please call our office 2 months in advance to schedule this appointment.  You may see Henry W Laguna III, MD or one of the following Advanced Practice Providers on your designated Care Team:   Lori Gerhardt, NP Laura Ingold, NP . Jill McDaniel, NP  Any Other Special Instructions Will Be Listed Below (If Applicable).    

## 2019-02-07 ENCOUNTER — Ambulatory Visit (HOSPITAL_COMMUNITY)
Admission: RE | Admit: 2019-02-07 | Discharge: 2019-02-07 | Disposition: A | Discharge status: Home / Self Care | Payer: Medicare PPO | Source: Ambulatory Visit | Attending: Internal Medicine | Admitting: Internal Medicine

## 2019-02-07 DIAGNOSIS — C73 Malignant neoplasm of thyroid gland: Secondary | ICD-10-CM | POA: Diagnosis present

## 2019-02-16 ENCOUNTER — Other Ambulatory Visit (HOSPITAL_COMMUNITY): Payer: Self-pay | Admitting: Internal Medicine

## 2019-02-16 DIAGNOSIS — C73 Malignant neoplasm of thyroid gland: Secondary | ICD-10-CM

## 2019-02-16 DIAGNOSIS — C8290 Follicular lymphoma, unspecified, unspecified site: Secondary | ICD-10-CM

## 2019-03-08 ENCOUNTER — Other Ambulatory Visit: Payer: Self-pay

## 2019-03-08 ENCOUNTER — Encounter (HOSPITAL_COMMUNITY)
Admission: RE | Admit: 2019-03-08 | Discharge: 2019-03-08 | Disposition: A | Discharge status: Home / Self Care | Payer: Medicare PPO | Source: Ambulatory Visit | Attending: Internal Medicine | Admitting: Internal Medicine

## 2019-03-08 DIAGNOSIS — C73 Malignant neoplasm of thyroid gland: Secondary | ICD-10-CM | POA: Insufficient documentation

## 2019-03-08 DIAGNOSIS — C8290 Follicular lymphoma, unspecified, unspecified site: Secondary | ICD-10-CM | POA: Insufficient documentation

## 2019-03-08 LAB — GLUCOSE, CAPILLARY: Glucose-Capillary: 104 mg/dL — ABNORMAL HIGH (ref 70–99)

## 2019-03-08 MED ORDER — FLUDEOXYGLUCOSE F - 18 (FDG) INJECTION
12.1000 | Freq: Once | INTRAVENOUS | Status: AC
  Administered 2019-03-08: 12.1 via INTRAVENOUS

## 2019-03-12 ENCOUNTER — Other Ambulatory Visit (HOSPITAL_COMMUNITY): Payer: Self-pay | Admitting: Internal Medicine

## 2019-03-12 DIAGNOSIS — C73 Malignant neoplasm of thyroid gland: Secondary | ICD-10-CM

## 2019-03-12 DIAGNOSIS — R590 Localized enlarged lymph nodes: Secondary | ICD-10-CM

## 2019-03-12 DIAGNOSIS — C8590 Non-Hodgkin lymphoma, unspecified, unspecified site: Secondary | ICD-10-CM

## 2019-03-13 ENCOUNTER — Other Ambulatory Visit (HOSPITAL_COMMUNITY): Payer: Self-pay | Admitting: Internal Medicine

## 2019-03-13 DIAGNOSIS — C8590 Non-Hodgkin lymphoma, unspecified, unspecified site: Secondary | ICD-10-CM

## 2019-03-13 DIAGNOSIS — C73 Malignant neoplasm of thyroid gland: Secondary | ICD-10-CM

## 2019-03-13 DIAGNOSIS — R590 Localized enlarged lymph nodes: Secondary | ICD-10-CM

## 2019-03-14 ENCOUNTER — Other Ambulatory Visit: Payer: Self-pay | Admitting: Radiology

## 2019-03-15 ENCOUNTER — Other Ambulatory Visit: Payer: Self-pay | Admitting: Radiology

## 2019-03-16 ENCOUNTER — Other Ambulatory Visit: Payer: Self-pay

## 2019-03-16 ENCOUNTER — Ambulatory Visit (HOSPITAL_COMMUNITY)
Admission: RE | Admit: 2019-03-16 | Discharge: 2019-03-16 | Disposition: A | Discharge status: Home / Self Care | Payer: Medicare PPO | Source: Ambulatory Visit | Attending: Internal Medicine | Admitting: Internal Medicine

## 2019-03-16 ENCOUNTER — Encounter (HOSPITAL_COMMUNITY): Payer: Self-pay

## 2019-03-16 DIAGNOSIS — I10 Essential (primary) hypertension: Secondary | ICD-10-CM | POA: Diagnosis not present

## 2019-03-16 DIAGNOSIS — C73 Malignant neoplasm of thyroid gland: Secondary | ICD-10-CM

## 2019-03-16 DIAGNOSIS — Z85038 Personal history of other malignant neoplasm of large intestine: Secondary | ICD-10-CM | POA: Diagnosis not present

## 2019-03-16 DIAGNOSIS — R590 Localized enlarged lymph nodes: Secondary | ICD-10-CM

## 2019-03-16 DIAGNOSIS — Z9049 Acquired absence of other specified parts of digestive tract: Secondary | ICD-10-CM | POA: Diagnosis not present

## 2019-03-16 DIAGNOSIS — Z79899 Other long term (current) drug therapy: Secondary | ICD-10-CM | POA: Insufficient documentation

## 2019-03-16 DIAGNOSIS — Z87891 Personal history of nicotine dependence: Secondary | ICD-10-CM | POA: Insufficient documentation

## 2019-03-16 DIAGNOSIS — Z886 Allergy status to analgesic agent status: Secondary | ICD-10-CM | POA: Diagnosis not present

## 2019-03-16 DIAGNOSIS — Z801 Family history of malignant neoplasm of trachea, bronchus and lung: Secondary | ICD-10-CM | POA: Insufficient documentation

## 2019-03-16 DIAGNOSIS — C8590 Non-Hodgkin lymphoma, unspecified, unspecified site: Secondary | ICD-10-CM | POA: Insufficient documentation

## 2019-03-16 DIAGNOSIS — I480 Paroxysmal atrial fibrillation: Secondary | ICD-10-CM | POA: Insufficient documentation

## 2019-03-16 DIAGNOSIS — Z833 Family history of diabetes mellitus: Secondary | ICD-10-CM | POA: Insufficient documentation

## 2019-03-16 DIAGNOSIS — E890 Postprocedural hypothyroidism: Secondary | ICD-10-CM | POA: Diagnosis not present

## 2019-03-16 DIAGNOSIS — Z79890 Hormone replacement therapy (postmenopausal): Secondary | ICD-10-CM | POA: Insufficient documentation

## 2019-03-16 DIAGNOSIS — Z85850 Personal history of malignant neoplasm of thyroid: Secondary | ICD-10-CM | POA: Insufficient documentation

## 2019-03-16 LAB — PROTIME-INR
INR: 1.1 (ref 0.8–1.2)
Prothrombin Time: 13.8 seconds (ref 11.4–15.2)

## 2019-03-16 LAB — CBC
HCT: 43.4 % (ref 36.0–46.0)
Hemoglobin: 13.9 g/dL (ref 12.0–15.0)
MCH: 32.1 pg (ref 26.0–34.0)
MCHC: 32 g/dL (ref 30.0–36.0)
MCV: 100.2 fL — ABNORMAL HIGH (ref 80.0–100.0)
Platelets: 135 10*3/uL — ABNORMAL LOW (ref 150–400)
RBC: 4.33 MIL/uL (ref 3.87–5.11)
RDW: 12.4 % (ref 11.5–15.5)
WBC: 9.1 10*3/uL (ref 4.0–10.5)
nRBC: 0 % (ref 0.0–0.2)

## 2019-03-16 MED ORDER — LIDOCAINE HCL (PF) 1 % IJ SOLN
INTRAMUSCULAR | Status: AC
  Filled 2019-03-16: qty 30

## 2019-03-16 NOTE — H&P (Signed)
Chief Complaint: Patient was seen in consultation today for neck lymph node biopsy at the request of [place]  Referring Physician(s): Kerr,Jeffrey  Supervising Physician: Aletta Edouard  Patient Status: [facility] - Out-pt  History of Present Illness: Tracy Mclaughlin is a 85 y.o. female   Hx NHL; Colon Ca and papillary carcinoma Palpable neck LAN Bilaterally x weeks  03/08/19  +PET:  IMPRESSION: 1. Extensive recurrent hypermetabolic lymphoma involving the neck, chest, abdomen and pelvis as detailed above. 2. No obvious osseous involvement.  No lung lesions.  Scheduled now for neck LAN biopsy   Past Medical History:  Diagnosis Date   Anemia    Arthritis    Atrial fibrillation ([place])    Cancer ([place])    Thyroid/Colon/ B cell Lymphoma-remission   Diverticulosis    History of atrial fibrillation    Was in only in a- fib for 2 days   Hypertension    Non Hodgkin's lymphoma ([place])    Non-Hodgkin's B-Cell Lymphoma   Papillary thyroid carcinoma ([facility])    Paroxysmal atrial fibrillation ([place])    Post-surgical hypothyroidism    Pulmonary embolus ([place]) 11/2011   Subarachnoid hemorrhage ([place]) 1996   Thyroid disease     Past Surgical History:  Procedure Laterality Date   [place]   S/P Lap Cholecystectomy   COLON RESECTION  11/12   mucinous adenocarcinoma of the cecum, resected   COLONOSCOPY  10/05/2011   COLONOSCOPY N/A 04/17/2014   Procedure: COLONOSCOPY;  Surgeon: Gatha Mayer, MD;  Location: [facility];  Service: Endoscopy;  Laterality: N/A;   ESOPHAGOGASTRODUODENOSCOPY N/A 04/17/2014   Procedure: ESOPHAGOGASTRODUODENOSCOPY (EGD);  Surgeon: Gatha Mayer, MD;  Location: [facility];  Service: Endoscopy;  Laterality: N/A;   KNEE ARTHROSCOPY Left 1998   S/P  Arthroscopic Left knee surgery   ROTATOR CUFF REPAIR Right 1998   SUBDURAL HEMATOMA EVACUATION VIA CRANIOTOMY     THYROIDECTOMY  05/23/2012   Procedure:  THYROIDECTOMY;  Surgeon: [name], MD;  Location: [facility];  Service: General;  Laterality: N/A;  Total thyroidectomy, central compartment lymph node biopsy times two, reimplantation left superior parathyroid gland.   TUBOPLASTY / TUBOTUBAL ANASTOMOSIS      Allergies: Demerol [meperidine hcl]  Medications: Prior to Admission medications   Medication Sig Start Date End Date Taking? Authorizing Provider  citalopram (CELEXA) 40 MG tablet Take 40 mg by mouth daily.   Yes [provider]  diltiazem (DILACOR XR) 180 MG 24 hr capsule Take 1 capsule (180 mg total) by mouth daily. 12/24/14  Yes Belva Crome, MD  diphenhydrAMINE (BENADRYL) 25 MG tablet Take 25 mg by mouth at bedtime as needed. For itching/sleep   Yes [provider]  levothyroxine (SYNTHROID, LEVOTHROID) 175 MCG tablet Take 175 mcg by mouth daily before breakfast.   Yes [provider]  losartan (COZAAR) 100 MG tablet Take 100 mg by mouth daily.   Yes [provider]  TRINTELLIX 5 MG TABS tablet Take 5 mg by mouth daily. 01/10/19  Yes [provider]     Family History  Problem Relation Age of Onset   Kidney cancer Sister        ?   Hypertension Sister    Lung cancer Brother    Hypertension Mother    Diabetes Mother    Coronary artery disease Mother    Diabetes Sister        x 2   Stroke Sister    Anesthesia  problems Neg Hx    Heart attack Neg Hx     Social History   Socioeconomic History   Marital status: Married    Spouse name: Not on file   Number of children: 0   Years of education: Not on file   Highest education level: Not on file  Occupational History   Occupation: retired  Scientist, product/process development strain: Not on file   Food insecurity:    Worry: Not on file    Inability: Not on file   Transportation needs:    Medical: Not on file    Non-medical: Not on file  Tobacco Use   Smoking status: Former Smoker    Packs/day: 1.00      Years: 12.00    Pack years: 12.00    Types: Cigarettes    Last attempt to quit: 08/23/1969    Years since quitting: 49.5   Smokeless tobacco: Never Used  Substance and Sexual Activity   Alcohol use: Yes    Comment: 1 manhattens per night   Drug use: No   Sexual activity: Not on file  Lifestyle   Physical activity:    Days per week: Not on file    Minutes per session: Not on file   Stress: Not on file  Relationships   Social connections:    Talks on phone: Not on file    Gets together: Not on file    Attends religious service: Not on file    Active member of club or organization: Not on file    Attends meetings of clubs or organizations: Not on file    Relationship status: Not on file  Other Topics Concern   Not on file  Social History Narrative   Not on file    Review of Systems: A 12 point ROS discussed and pertinent positives are indicated in the HPI above.  All other systems are negative.  Review of Systems  Constitutional: Negative for activity change, fatigue and fever.  HENT: Negative for sore throat.   Respiratory: Negative for cough and shortness of breath.   Cardiovascular: Negative for chest pain.  Gastrointestinal: Negative for abdominal pain.  Neurological: Negative for weakness.  Psychiatric/Behavioral: Negative for behavioral problems and confusion.    Vital Signs: BP (!) 123/54    Pulse 66    Temp 98 F (36.7 C) (Oral)    Resp 16    Ht 5\' 7"  (1.702 m)    Wt 220 lb (99.8 kg)    SpO2 93%    BMI 34.46 kg/m   Physical Exam Vitals signs reviewed.  Cardiovascular:     Rate and Rhythm: Normal rate and regular rhythm.     Heart sounds: Normal heart sounds.  Pulmonary:     Effort: Pulmonary effort is normal.     Breath sounds: Normal breath sounds.  Abdominal:     Palpations: Abdomen is soft.  Musculoskeletal: Normal range of motion.  Lymphadenopathy:     Cervical: Cervical adenopathy present.  Skin:    General: Skin is warm and dry.   Neurological:     Mental Status: She is alert and oriented to person, place, and time.  Psychiatric:        Mood and Affect: Mood normal.        Behavior: Behavior normal.        Thought Content: Thought content normal.        Judgment: Judgment normal.     Imaging: Nm Pet Image Restag (  ps) Skull Base To Thigh  Result Date: 03/08/2019 CLINICAL DATA:  Subsequent treatment strategy for non-Hodgkin's lymphoma. EXAM: NUCLEAR MEDICINE PET SKULL BASE TO THIGH TECHNIQUE: 12.1 mCi F-18 FDG was injected intravenously. Full-ring PET imaging was performed from the skull base to thigh after the radiotracer. CT data was obtained and used for attenuation correction and anatomic localization. Fasting blood glucose: 104 mg/dl COMPARISON:  PET-CT 09/28/2016 FINDINGS: Mediastinal blood pool activity: SUV max 2.87 NECK: Diffuse and extensive adenopathy throughout all stations of the neck. Index left-sided level 2 lymph node on image number 31 measures 15 mm and the SUV max is 7.47. Index level 2 lymph node on the right side on image number 33 measures 20 mm and the SUV max is 6.71. Level 3 lymph node on the left side on image number 36 measures 25 mm and the SUV max is 8.48. Incidental CT findings: none CHEST: Extensive supraclavicular, axillary, mediastinal and hilar lymphadenopathy. Left-sided supraclavicular lymph node on image number 47 has an SUV max of 6.28. 25 mm right axillary node on image number 65 has an SUV max of 7.64. 23 mm prevascular lymph node on image number 67 has an SUV max of 8.79 19.5 mm right-sided subcarinal lymph node on image number 81 has an SUV max of 6.31 No worrisome lung lesions. Incidental CT findings: none ABDOMEN/PELVIS: Diffuse bulky mesenteric and retroperitoneal lymphadenopathy. Large nodal mass in the retroperitoneum measures approximately 9.5 x 5.0 cm on image number 134 and the SUV max is 9.05. Bilateral pelvic adenopathy. 22 mm left operator lymph node on image number 185 has an  SUV max of 7.55 Bilateral axillary lymphadenopathy. Index right inguinal lymph node on image number 199 measures 13.5 mm and the SUV max is 5.38. The spleen is mildly enlarged but not nearly is largely a was back in 2017. Numerous small hypermetabolic lesions throughout the spleen with SUV max of 6.24. Incidental CT findings: none SKELETON: No obvious osseous involvement. There are small scattered lesions in the subcutaneous fat overlying the lower chest areas. These are likely lymph nodes. Incidental CT findings: none IMPRESSION: 1. Extensive recurrent hypermetabolic lymphoma involving the neck, chest, abdomen and pelvis as detailed above. 2. No obvious osseous involvement.  No lung lesions. Electronically Signed   By: Marijo Sanes M.D.   On: 03/08/2019 15:53    Labs:  CBC: Recent Labs    06/08/18 0910 08/31/18 0928 01/01/19 1052 03/16/19 0715  WBC 11.1* 8.5 10.2 9.1  HGB 14.8 15.3 15.1* 13.9  HCT 43.5 45.0 44.4 43.4  PLT 145 134* 159 135*    COAGS: Recent Labs    03/16/19 0715  INR 1.1    BMP: Recent Labs    06/08/18 0910  NA 139  K 4.7  CL 105  CO2 27  GLUCOSE 105  BUN 23  CALCIUM 9.8  CREATININE 0.97  GFRNONAA 53*  GFRAA >60    LIVER FUNCTION TESTS: Recent Labs    06/08/18 0910  BILITOT 1.1  AST 23  ALT 11  ALKPHOS 124  PROT 6.9  ALBUMIN 4.1    TUMOR MARKERS: No results for input(s): AFPTM, CEA, CA199, CHROMGRNA in the last 8760 hours.  Assessment and Plan:  Bilateral Cervical LAN for weeks Hx Colon ca; NHL and Papillary Carcinoma + PET Scheduled for biopsy of neck LAN Pt prefers No sedation Risks and benefits of Neck Lymphadenopathy was discussed with the patient and/or patient's family including, but not limited to bleeding, infection, damage to adjacent structures or  low yield requiring additional tests.  All of the questions were answered and there is agreement to proceed. Consent signed and in chart.   Thank you for this interesting  consult.  I greatly enjoyed meeting Tracy Mclaughlin and look forward to participating in their care.  A copy of this report was sent to the requesting provider on this date.  Electronically Signed: Lavonia Drafts, PA-C 03/16/2019, 7:54 AM   I spent a total of  30 Minutes   in face to face in clinical consultation, greater than 50% of which was counseling/coordinating care for Neck LAN bx

## 2019-03-16 NOTE — Sedation Documentation (Signed)
Patient is refusing sedation for this procedure. Writer will remain with patient to monitor.

## 2019-03-16 NOTE — Procedures (Signed)
Interventional Radiology Procedure Note  Procedure: US Guided Biopsy of Left Cervical Lymph Node  Complications: None  Estimated Blood Loss: < 10 mL  Findings: 35 G core biopsy of enlarged left cervical lymph node performed under US guidance.  Four core samples obtained and sent to Pathology.  Venetia Night. Kathlene Cote, M.D Pager:  [phone]

## 2019-03-16 NOTE — Discharge Instructions (Addendum)
Needle Biopsy, Care After  This sheet gives you information about how to care for yourself after your procedure. Your health care provider may also give you more specific instructions. If you have problems or questions, contact your health care provider.  What can I expect after the procedure?  After the procedure, it is common to have soreness, bruising, or mild pain at the puncture site. This should go away in a few days.  Follow these instructions at home:  Needle insertion site care     Wash your hands with soap and water before you change your bandage (dressing). If you cannot use soap and water, use hand sanitizer.   Follow instructions from your health care provider about how to take care of your puncture site. This includes:  ? When and how to change your dressing.  ? When to remove your dressing.   Check your puncture site every day for signs of infection. Check for:  ? Redness, swelling, or pain.  ? Fluid or blood.  ? Pus or a bad smell.  ? Warmth.  General instructions   Return to your normal activities as told by your health care provider. Ask your health care provider what activities are safe for you.   Do not take baths, swim, or use a hot tub until your health care provider approves. Ask your health care provider if you may take showers. You may only be allowed to take sponge baths.   Take over-the-counter and prescription medicines only as told by your health care provider.   Keep all follow-up visits as told by your health care provider. This is important.  Contact a health care provider if:   You have a fever.   You have redness, swelling, or pain at the puncture site that lasts longer than a few days.   You have fluid, blood, or pus coming from your puncture site.   Your puncture site feels warm to the touch.  Get help right away if:   You have severe bleeding from the puncture site.  Summary   After the procedure, it is common to have soreness, bruising, or mild pain at the puncture  site. This should go away in a few days.   Check your puncture site every day for signs of infection, such as redness, swelling, or pain.   Get help right away if you have severe bleeding from your puncture site.  This information is not intended to replace advice given to you by your health care provider. Make sure you discuss any questions you have with your health care provider.  Document Released: 04/22/2015 Document Revised: 12/19/2017 Document Reviewed: 12/19/2017  Elsevier Interactive Patient Education  2019 Elsevier Inc.

## 2019-03-20 ENCOUNTER — Telehealth: Payer: Self-pay | Admitting: *Deleted

## 2019-03-20 ENCOUNTER — Other Ambulatory Visit: Payer: Self-pay | Admitting: Oncology

## 2019-03-20 DIAGNOSIS — C8510 Unspecified B-cell lymphoma, unspecified site: Secondary | ICD-10-CM

## 2019-03-20 NOTE — Telephone Encounter (Signed)
Per Dr. Benay Spice: Scheduled for lab/OV on 03/21/19 at 1015/1045. Patient notified. She has no fever, has had cough for 4 months and shortness of breath for over a year. She has not traveled to any high risk areas. She lives in [place], [place].

## 2019-03-21 ENCOUNTER — Inpatient Hospital Stay: Payer: Medicare PPO | Attending: Oncology

## 2019-03-21 ENCOUNTER — Inpatient Hospital Stay (HOSPITAL_BASED_OUTPATIENT_CLINIC_OR_DEPARTMENT_OTHER): Payer: Medicare PPO | Admitting: Nurse Practitioner

## 2019-03-21 ENCOUNTER — Other Ambulatory Visit: Payer: Self-pay

## 2019-03-21 ENCOUNTER — Encounter: Payer: Self-pay | Admitting: Nurse Practitioner

## 2019-03-21 VITALS — BP 116/51 | HR 96 | Temp 98.0°F | Resp 20 | Wt 224.2 lb

## 2019-03-21 DIAGNOSIS — Z5111 Encounter for antineoplastic chemotherapy: Secondary | ICD-10-CM | POA: Insufficient documentation

## 2019-03-21 DIAGNOSIS — Z5112 Encounter for antineoplastic immunotherapy: Secondary | ICD-10-CM | POA: Diagnosis present

## 2019-03-21 DIAGNOSIS — R05 Cough: Secondary | ICD-10-CM

## 2019-03-21 DIAGNOSIS — C8267 Cutaneous follicle center lymphoma, spleen: Secondary | ICD-10-CM | POA: Diagnosis not present

## 2019-03-21 DIAGNOSIS — C8261 Cutaneous follicle center lymphoma, lymph nodes of head, face, and neck: Secondary | ICD-10-CM

## 2019-03-21 DIAGNOSIS — R110 Nausea: Secondary | ICD-10-CM | POA: Insufficient documentation

## 2019-03-21 DIAGNOSIS — Z86711 Personal history of pulmonary embolism: Secondary | ICD-10-CM

## 2019-03-21 DIAGNOSIS — C8510 Unspecified B-cell lymphoma, unspecified site: Secondary | ICD-10-CM

## 2019-03-21 DIAGNOSIS — R5381 Other malaise: Secondary | ICD-10-CM

## 2019-03-21 DIAGNOSIS — R61 Generalized hyperhidrosis: Secondary | ICD-10-CM

## 2019-03-21 DIAGNOSIS — R0609 Other forms of dyspnea: Secondary | ICD-10-CM

## 2019-03-21 DIAGNOSIS — C8518 Unspecified B-cell lymphoma, lymph nodes of multiple sites: Secondary | ICD-10-CM

## 2019-03-21 DIAGNOSIS — Z7189 Other specified counseling: Secondary | ICD-10-CM | POA: Insufficient documentation

## 2019-03-21 LAB — CBC WITH DIFFERENTIAL (CANCER CENTER ONLY)
Abs Immature Granulocytes: 0.03 10*3/uL (ref 0.00–0.07)
Basophils Absolute: 0.1 10*3/uL (ref 0.0–0.1)
Basophils Relative: 1 %
Eosinophils Absolute: 0.5 10*3/uL (ref 0.0–0.5)
Eosinophils Relative: 5 %
HCT: 47.1 % — ABNORMAL HIGH (ref 36.0–46.0)
Hemoglobin: 15.5 g/dL — ABNORMAL HIGH (ref 12.0–15.0)
Immature Granulocytes: 0 %
Lymphocytes Relative: 38 %
Lymphs Abs: 3.6 10*3/uL (ref 0.7–4.0)
MCH: 32.7 pg (ref 26.0–34.0)
MCHC: 32.9 g/dL (ref 30.0–36.0)
MCV: 99.4 fL (ref 80.0–100.0)
Monocytes Absolute: 1 10*3/uL (ref 0.1–1.0)
Monocytes Relative: 10 %
Neutro Abs: 4.3 10*3/uL (ref 1.7–7.7)
Neutrophils Relative %: 46 %
Platelet Count: 160 10*3/uL (ref 150–400)
RBC: 4.74 MIL/uL (ref 3.87–5.11)
RDW: 12.7 % (ref 11.5–15.5)
WBC Count: 9.5 10*3/uL (ref 4.0–10.5)
nRBC: 0 % (ref 0.0–0.2)

## 2019-03-21 LAB — CMP (CANCER CENTER ONLY)
ALT: 19 U/L (ref 0–44)
AST: 42 U/L — ABNORMAL HIGH (ref 15–41)
Albumin: 3.8 g/dL (ref 3.5–5.0)
Alkaline Phosphatase: 130 U/L — ABNORMAL HIGH (ref 38–126)
Anion gap: 12 (ref 5–15)
BUN: 19 mg/dL (ref 8–23)
CO2: 21 mmol/L — ABNORMAL LOW (ref 22–32)
Calcium: 9.2 mg/dL (ref 8.9–10.3)
Chloride: 107 mmol/L (ref 98–111)
Creatinine: 1.17 mg/dL — ABNORMAL HIGH (ref 0.44–1.00)
GFR, Est AFR Am: 50 mL/min — ABNORMAL LOW (ref >60)
GFR, Estimated: 43 mL/min — ABNORMAL LOW (ref >60)
Glucose, Bld: 104 mg/dL — ABNORMAL HIGH (ref 70–99)
Potassium: 4.9 mmol/L (ref 3.5–5.1)
Sodium: 140 mmol/L (ref 135–145)
Total Bilirubin: 1 mg/dL (ref 0.3–1.2)
Total Protein: 7.4 g/dL (ref 6.5–8.1)

## 2019-03-21 LAB — URIC ACID: Uric Acid, Serum: 9.4 mg/dL — ABNORMAL HIGH (ref 2.5–7.1)

## 2019-03-21 LAB — LACTATE DEHYDROGENASE: LDH: 469 U/L — ABNORMAL HIGH (ref 98–192)

## 2019-03-21 MED ORDER — ALLOPURINOL 100 MG PO TABS: 100.0000 mg | ORAL_TABLET | Freq: Every day | ORAL | 0 refills | Status: DC

## 2019-03-21 NOTE — Progress Notes (Signed)
START ON PATHWAY REGIMEN - Lymphoma and CLL     A cycle is every 28 days:     Bendamustine      Rituximab   **Always confirm dose/schedule in your pharmacy ordering system**  Patient Characteristics: Marginal Zone Lymphoma, Systemic, Second Line Disease Type: Marginal Zone Lymphoma Disease Type: Not Applicable Disease Type: Not Applicable Localized or Systemic Disease<= Systemic [place] Stage: IV Line of Therapy: Second Line Intent of Therapy: Non-Curative / Palliative Intent, Discussed with Patient

## 2019-03-21 NOTE — Progress Notes (Addendum)
[facility] OFFICE PROGRESS NOTE   Diagnosis: Non-Hodgkin's lymphoma  INTERVAL HISTORY:   Tracy Mclaughlin returns prior to scheduled follow-up.  She reports a decrease in appetite and energy level over the past several months.  A few months ago she developed "knots" on her neck.  She has having mild sweats at nighttime.  No fever.  She has progressive dyspnea on exertion and a cough over the past 6 to 8 months.  She feels that the cough is likely related to pollen.  She denies diarrhea.  She has slight nausea.  No vomiting.  Objective:  Vital signs in last 24 hours:  Blood pressure (!) 116/51, pulse 96, temperature 98 F (36.7 C), temperature source Oral, resp. rate 20, weight 224 lb 3 oz (101.7 kg).    Lymphatics: Bilateral cervical, left supraclavicular and bilateral axillary lymph nodes.  No inguinal lymph nodes. Resp: Lungs clear bilaterally. Cardio: Regular rate and rhythm. GI: No splenomegaly. Vascular: No leg edema. Skin: Erythematous maculopapular rash over the upper abdominal wall   Lab Results:  Lab Results  Component Value Date   WBC 9.5 03/21/2019   HGB 15.5 (H) 03/21/2019   HCT 47.1 (H) 03/21/2019   MCV 99.4 03/21/2019   PLT 160 03/21/2019   NEUTROABS 4.3 03/21/2019   LDH 469 Imaging:  No results found.  Medications: I have reviewed the patient's current medications.  Assessment/Plan: 1. 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 Status post weekly Rituxan x4 03/01/2012 through 03/22/2012. She completed 4 "maintenance "doses of Rituxan, last on 12/19/2012. A restaging CT on 02/09/2013 showed no evidence of lymphoma.   Lymph node lateral to the thyroid bed on a neck ultrasound 02/21/2014, status post an FNA biopsy concerning for a lymphoproliferative disorder.  PET scan 09/28/2016 with active lymphoma within the neck, chest, abdomen, pelvis;  splenic enlargement and hypermetabolism suspicious for splenic involvement.  Initiation of Rituxan weekly 4 09/29/2016  Initiation of maintenance Rituxan on a 3 month schedule 12/23/2016; final Rituxan 08/31/2018  Thyroid ultrasound 02/07/2019-left cervical lymphadenopathy  PET scan 03/08/2019- extensive recurrent hypermetabolic lymphoma involving the neck, chest, abdomen and pelvis.  03/16/2019 left cervical lymph node biopsy- features consistent with previously diagnosed non-Hodgkin's B-cell lymphoma, phenotypically consistent with marginal zone lymphoma.  Flow cytometry with lambda restricted B-cell population without expression of CD5 or CD10 comprising 87% of all lymphocytes. 2. Stage IV (T1bN1b M0) papillary thyroid cancer, status post a thyroidectomy with reimplantation of the left superior parathyroid gland on 05/23/2012, status post radioactive iodine therapy, followed by Dr. Buddy Duty.  3. Stage II (T3 N0) colon cancer, status post a right colectomy 10/19/2011, last colonoscopy April 2015-sigmoid adenoma removed.  4. History of a pulmonary embolism December 2012.  5. History of Atrial fibrillation 6. Iron deficiency anemia-new 03/18/2014. Hemoccult positive stool. The anemia corrected with iron. No longer taking iron.  Status post an upper endoscopy and colonoscopy by Dr. Carlean Purl April 2015 with no bleeding source identified, benign adenoma removed from the sigmoid colon. 7. Report of an upper gastric intestinal bleed fall 2016-managed in [place]. [name] 8. Left knee replacement May 2017, repeat left knee surgery May 2018 9. Pruritic rash 07/22/2016   Disposition: Tracy Mclaughlin has recurrent non-Hodgkin's lymphoma.  Recent left cervical lymph node biopsy consistent with previously diagnosed non-Hodgkin's B-cell lymphoma/marginal zone lymphoma.  PET scan shows widespread involvement.  She is symptomatic with sweats, malaise, anorexia.  Dr. Benay Spice recommends initiating treatment with  bendamustine/Rituxan.  She has had Rituxan on multiple occasions in the past and is familiar with the potential side effects.  We reviewed these again at today's visit.  We discussed potential toxicities associated with bendamustine including bone marrow toxicity, nausea.  We discussed the potential for tumor lysis syndrome.  She will begin allopurinol 100 mg daily.  She will return for cycle 1 on 03/22/2019.  She will return for lab and follow-up on 04/02/2019.  She will contact the office in the interim with any problems.  Patient seen with Dr. Benay Spice.  CT images reviewed on the computer with Ms. Faulds.  30 minutes were spent face-to-face at today's visit with the majority of that time involved in counseling/coordination of care.    Ned Card ANP/GNP-BC   03/21/2019  11:06 AM  This was a shared visit with Ned Card.  Tracy Mclaughlin was interviewed and examined.  We reviewed PET scan images with her.  She has symptomatic progression of non-Hodgkin's lymphoma.  She appears to have persistence of the low-grade lymphoma.  I discussed treatment options with Ms. Eline.  I recommend bendamustine/rituximab.  She agrees to proceed.  We reviewed potential toxicities associated with this regimen.  Julieanne Manson, MD

## 2019-03-22 ENCOUNTER — Inpatient Hospital Stay: Payer: Medicare PPO

## 2019-03-22 ENCOUNTER — Telehealth: Payer: Self-pay | Admitting: Nurse Practitioner

## 2019-03-22 ENCOUNTER — Other Ambulatory Visit: Payer: Self-pay | Admitting: *Deleted

## 2019-03-22 ENCOUNTER — Other Ambulatory Visit: Payer: Self-pay | Admitting: Nurse Practitioner

## 2019-03-22 ENCOUNTER — Other Ambulatory Visit: Payer: Self-pay

## 2019-03-22 VITALS — BP 105/88 | HR 84 | Temp 98.9°F | Resp 18

## 2019-03-22 DIAGNOSIS — Z5112 Encounter for antineoplastic immunotherapy: Secondary | ICD-10-CM | POA: Diagnosis not present

## 2019-03-22 DIAGNOSIS — C8518 Unspecified B-cell lymphoma, lymph nodes of multiple sites: Secondary | ICD-10-CM

## 2019-03-22 LAB — HEPATITIS B SURFACE ANTIGEN: Hepatitis B Surface Ag: NEGATIVE

## 2019-03-22 LAB — HEPATITIS B CORE ANTIBODY, TOTAL: Hep B Core Total Ab: NEGATIVE

## 2019-03-22 MED ORDER — DIPHENHYDRAMINE HCL 25 MG PO CAPS
50.0000 mg | ORAL_CAPSULE | Freq: Once | ORAL | Status: AC
  Administered 2019-03-22: 50 mg via ORAL

## 2019-03-22 MED ORDER — SODIUM CHLORIDE 0.9 % IV SOLN
70.0000 mg/m2 | Freq: Once | INTRAVENOUS | Status: AC
  Administered 2019-03-22: 150 mg via INTRAVENOUS
  Filled 2019-03-22: qty 6

## 2019-03-22 MED ORDER — PROCHLORPERAZINE MALEATE 10 MG PO TABS: 10.0000 mg | ORAL_TABLET | Freq: Four times a day (QID) | ORAL | 1 refills | Status: DC | PRN

## 2019-03-22 MED ORDER — FAMOTIDINE IN NACL 20-0.9 MG/50ML-% IV SOLN: 20.0000 mg | Freq: Once | INTRAVENOUS | Status: DC

## 2019-03-22 MED ORDER — SODIUM CHLORIDE 0.9 % IV SOLN: 10.0000 mg | Freq: Once | INTRAVENOUS | Status: DC

## 2019-03-22 MED ORDER — SODIUM CHLORIDE 0.9 % IV SOLN
Freq: Once | INTRAVENOUS | Status: AC
  Administered 2019-03-22: 08:00:00 via INTRAVENOUS
  Filled 2019-03-22: qty 250

## 2019-03-22 MED ORDER — METHYLPREDNISOLONE SODIUM SUCC 125 MG IJ SOLR
INTRAMUSCULAR | Status: AC
  Filled 2019-03-22: qty 2

## 2019-03-22 MED ORDER — SODIUM CHLORIDE 0.9 % IV SOLN
20.0000 mg | Freq: Once | INTRAVENOUS | Status: AC
  Administered 2019-03-22: 20 mg via INTRAVENOUS
  Filled 2019-03-22: qty 2

## 2019-03-22 MED ORDER — DEXAMETHASONE SODIUM PHOSPHATE 10 MG/ML IJ SOLN: 10.0000 mg | Freq: Once | INTRAMUSCULAR | Status: DC

## 2019-03-22 MED ORDER — DIPHENHYDRAMINE HCL 25 MG PO CAPS
ORAL_CAPSULE | ORAL | Status: AC
  Filled 2019-03-22: qty 2

## 2019-03-22 MED ORDER — PALONOSETRON HCL INJECTION 0.25 MG/5ML
INTRAVENOUS | Status: AC
  Filled 2019-03-22: qty 5

## 2019-03-22 MED ORDER — METHYLPREDNISOLONE SODIUM SUCC 125 MG IJ SOLR
125.0000 mg | Freq: Once | INTRAMUSCULAR | Status: AC
  Administered 2019-03-22: 125 mg via INTRAVENOUS

## 2019-03-22 MED ORDER — ACETAMINOPHEN 325 MG PO TABS
ORAL_TABLET | ORAL | Status: AC
  Filled 2019-03-22: qty 2

## 2019-03-22 MED ORDER — ACETAMINOPHEN 325 MG PO TABS
650.0000 mg | ORAL_TABLET | Freq: Once | ORAL | Status: AC
  Administered 2019-03-22: 650 mg via ORAL

## 2019-03-22 MED ORDER — PALONOSETRON HCL INJECTION 0.25 MG/5ML
0.2500 mg | Freq: Once | INTRAVENOUS | Status: AC
  Administered 2019-03-22: 0.25 mg via INTRAVENOUS

## 2019-03-22 MED ORDER — SODIUM CHLORIDE 0.9 % IV SOLN
375.0000 mg/m2 | Freq: Once | INTRAVENOUS | Status: AC
  Administered 2019-03-22: 800 mg via INTRAVENOUS
  Filled 2019-03-22: qty 50

## 2019-03-22 NOTE — Progress Notes (Signed)
Last Rituxan treatment was in 9/19. MD wishes to run Rituxan as a first dose today and add SM 125 and Pepcid IV 20mg  to premeds today and for future treatments.  Changed orders as discussed.  Hardie Pulley, PharmD, BCPS, BCOP

## 2019-03-22 NOTE — Patient Instructions (Signed)
[facility] Discharge Instructions for Patients Receiving Chemotherapy  Today you received the following chemotherapy agents Rituxan and Bendeka  To help prevent nausea and vomiting after your treatment, we encourage you to take your nausea medication as directed   If you develop nausea and vomiting that is not controlled by your nausea medication, call the clinic.   BELOW ARE SYMPTOMS THAT SHOULD BE REPORTED IMMEDIATELY:  *FEVER GREATER THAN 100.5 F  *CHILLS WITH OR WITHOUT FEVER  NAUSEA AND VOMITING THAT IS NOT CONTROLLED WITH YOUR NAUSEA MEDICATION  *UNUSUAL SHORTNESS OF BREATH  *UNUSUAL BRUISING OR BLEEDING  TENDERNESS IN MOUTH AND THROAT WITH OR WITHOUT PRESENCE OF ULCERS  *URINARY PROBLEMS  *BOWEL PROBLEMS  UNUSUAL RASH Items with * indicate a potential emergency and should be followed up as soon as possible.  Feel free to call the clinic should you have any questions or concerns. The clinic phone number is [phone].  Please show the [facility] at check-in to the Emergency Department and triage nurse.   Coronavirus (COVID-19) Are you at risk?  Are you at risk for the Coronavirus (COVID-19)?  To be considered HIGH RISK for Coronavirus (COVID-19), you have to meet the following criteria:  . Traveled to [place], [place], [place], [place] or [place]; or in the [place] to [place], [place], [place], or [place]; and have fever, cough, and shortness of breath within the last 2 weeks of travel OR . Been in close contact with a person diagnosed with COVID-19 within the last 2 weeks and have fever, cough, and shortness of breath . IF YOU DO NOT MEET THESE CRITERIA, YOU ARE CONSIDERED LOW RISK FOR COVID-19.  What to do if you are HIGH RISK for COVID-19?  Marland Kitchen If you are having a medical emergency, call 911. . Seek medical care right away. Before you go to a doctor's office, urgent care or emergency department, call ahead and tell them about  your recent travel, contact with someone diagnosed with COVID-19, and your symptoms. You should receive instructions from your physician's office regarding next steps of care.  . When you arrive at healthcare provider, tell the healthcare staff immediately you have returned from visiting [place], [place], [place], [place] or [place]; or traveled in the [place] to [place], [place], [place], or [place]; in the last two weeks or you have been in close contact with a person diagnosed with COVID-19 in the last 2 weeks.   . Tell the health care staff about your symptoms: fever, cough and shortness of breath. . After you have been seen by a medical provider, you will be either: o Tested for (COVID-19) and discharged home on quarantine except to seek medical care if symptoms worsen, and asked to  - Stay home and avoid contact with others until you get your results (4-5 days)  - Avoid travel on public transportation if possible (such as bus, train, or airplane) or o Sent to the Emergency Department by EMS for evaluation, COVID-19 testing, and possible admission depending on your condition and test results.  What to do if you are LOW RISK for COVID-19?  Reduce your risk of any infection by using the same precautions used for avoiding the common cold or flu:  Marland Kitchen Wash your hands often with soap and warm water for at least 20 seconds.  If soap and water are not readily available, use an alcohol-based hand sanitizer with at least 60% alcohol.  . If  coughing or sneezing, cover your mouth and nose by coughing or sneezing into the elbow areas of your shirt or coat, into a tissue or into your sleeve (not your hands). . Avoid shaking hands with others and consider head nods or verbal greetings only. . Avoid touching your eyes, nose, or mouth with unwashed hands.  . Avoid close contact with people who are sick. . Avoid places or events with large numbers of people in one location, like concerts or sporting  events. . Carefully consider travel plans you have or are making. . If you are planning any travel outside or inside the [place], visit the CDC's Travelers' Health webpage for the latest health notices. . If you have some symptoms but not all symptoms, continue to monitor at home and seek medical attention if your symptoms worsen. . If you are having a medical emergency, call 911.   [facility] / e-Visit: [email or web address]         [facility]: [facility]: [phone]                   [facility]: [phone] \

## 2019-03-22 NOTE — Telephone Encounter (Signed)
Scheduled appt per 4/1 los

## 2019-03-23 ENCOUNTER — Other Ambulatory Visit: Payer: Self-pay

## 2019-03-23 ENCOUNTER — Inpatient Hospital Stay: Payer: Medicare PPO

## 2019-03-23 VITALS — BP 111/40 | HR 58 | Temp 98.5°F | Resp 18

## 2019-03-23 DIAGNOSIS — Z5112 Encounter for antineoplastic immunotherapy: Secondary | ICD-10-CM | POA: Diagnosis not present

## 2019-03-23 DIAGNOSIS — C8518 Unspecified B-cell lymphoma, lymph nodes of multiple sites: Secondary | ICD-10-CM

## 2019-03-23 MED ORDER — DEXAMETHASONE SODIUM PHOSPHATE 10 MG/ML IJ SOLN
10.0000 mg | Freq: Once | INTRAMUSCULAR | Status: AC
  Administered 2019-03-23: 10 mg via INTRAVENOUS

## 2019-03-23 MED ORDER — DEXAMETHASONE SODIUM PHOSPHATE 10 MG/ML IJ SOLN
INTRAMUSCULAR | Status: AC
  Filled 2019-03-23: qty 1

## 2019-03-23 MED ORDER — SODIUM CHLORIDE 0.9 % IV SOLN: 10.0000 mg | Freq: Once | INTRAVENOUS | Status: DC

## 2019-03-23 MED ORDER — SODIUM CHLORIDE 0.9 % IV SOLN
70.0000 mg/m2 | Freq: Once | INTRAVENOUS | Status: AC
  Administered 2019-03-23: 150 mg via INTRAVENOUS
  Filled 2019-03-23: qty 6

## 2019-03-23 MED ORDER — SODIUM CHLORIDE 0.9 % IV SOLN
Freq: Once | INTRAVENOUS | Status: AC
  Administered 2019-03-23: 15:00:00 via INTRAVENOUS
  Filled 2019-03-23: qty 250

## 2019-03-26 ENCOUNTER — Other Ambulatory Visit: Payer: Self-pay

## 2019-03-26 ENCOUNTER — Inpatient Hospital Stay: Payer: Medicare PPO

## 2019-03-26 DIAGNOSIS — Z5112 Encounter for antineoplastic immunotherapy: Secondary | ICD-10-CM | POA: Diagnosis not present

## 2019-03-26 DIAGNOSIS — C8518 Unspecified B-cell lymphoma, lymph nodes of multiple sites: Secondary | ICD-10-CM

## 2019-03-26 LAB — URIC ACID: Uric Acid, Serum: 9.3 mg/dL — ABNORMAL HIGH (ref 2.5–7.1)

## 2019-03-26 LAB — CMP (CANCER CENTER ONLY)
ALT: 51 U/L — ABNORMAL HIGH (ref 0–44)
AST: 59 U/L — ABNORMAL HIGH (ref 15–41)
Albumin: 3.8 g/dL (ref 3.5–5.0)
Alkaline Phosphatase: 97 U/L (ref 38–126)
Anion gap: 11 (ref 5–15)
BUN: 32 mg/dL — ABNORMAL HIGH (ref 8–23)
CO2: 24 mmol/L (ref 22–32)
Calcium: 7.9 mg/dL — ABNORMAL LOW (ref 8.9–10.3)
Chloride: 106 mmol/L (ref 98–111)
Creatinine: 1.17 mg/dL — ABNORMAL HIGH (ref 0.44–1.00)
GFR, Est AFR Am: 50 mL/min — ABNORMAL LOW (ref >60)
GFR, Estimated: 43 mL/min — ABNORMAL LOW (ref >60)
Glucose, Bld: 90 mg/dL (ref 70–99)
Potassium: 4.9 mmol/L (ref 3.5–5.1)
Sodium: 141 mmol/L (ref 135–145)
Total Bilirubin: 1.5 mg/dL — ABNORMAL HIGH (ref 0.3–1.2)
Total Protein: 6.8 g/dL (ref 6.5–8.1)

## 2019-03-26 LAB — PHOSPHORUS: Phosphorus: 4 mg/dL (ref 2.5–4.6)

## 2019-04-02 ENCOUNTER — Other Ambulatory Visit: Payer: Self-pay

## 2019-04-02 ENCOUNTER — Inpatient Hospital Stay: Payer: Medicare PPO

## 2019-04-02 ENCOUNTER — Encounter: Payer: Self-pay | Admitting: Nurse Practitioner

## 2019-04-02 ENCOUNTER — Other Ambulatory Visit: Payer: Self-pay | Admitting: Nurse Practitioner

## 2019-04-02 ENCOUNTER — Inpatient Hospital Stay (HOSPITAL_BASED_OUTPATIENT_CLINIC_OR_DEPARTMENT_OTHER): Payer: Medicare PPO | Admitting: Nurse Practitioner

## 2019-04-02 ENCOUNTER — Telehealth: Payer: Self-pay | Admitting: Oncology

## 2019-04-02 VITALS — BP 117/44 | HR 84 | Temp 100.0°F | Resp 18 | Ht 67.0 in

## 2019-04-02 DIAGNOSIS — C8261 Cutaneous follicle center lymphoma, lymph nodes of head, face, and neck: Secondary | ICD-10-CM | POA: Diagnosis not present

## 2019-04-02 DIAGNOSIS — C8267 Cutaneous follicle center lymphoma, spleen: Secondary | ICD-10-CM

## 2019-04-02 DIAGNOSIS — Z5112 Encounter for antineoplastic immunotherapy: Secondary | ICD-10-CM | POA: Diagnosis not present

## 2019-04-02 DIAGNOSIS — R531 Weakness: Secondary | ICD-10-CM

## 2019-04-02 DIAGNOSIS — R05 Cough: Secondary | ICD-10-CM

## 2019-04-02 DIAGNOSIS — C8518 Unspecified B-cell lymphoma, lymph nodes of multiple sites: Secondary | ICD-10-CM

## 2019-04-02 DIAGNOSIS — R197 Diarrhea, unspecified: Secondary | ICD-10-CM

## 2019-04-02 DIAGNOSIS — R1012 Left upper quadrant pain: Secondary | ICD-10-CM

## 2019-04-02 DIAGNOSIS — R0602 Shortness of breath: Secondary | ICD-10-CM | POA: Diagnosis not present

## 2019-04-02 DIAGNOSIS — R110 Nausea: Secondary | ICD-10-CM

## 2019-04-02 DIAGNOSIS — Z86711 Personal history of pulmonary embolism: Secondary | ICD-10-CM

## 2019-04-02 LAB — CBC WITH DIFFERENTIAL (CANCER CENTER ONLY)
Abs Immature Granulocytes: 0.02 10*3/uL (ref 0.00–0.07)
Basophils Absolute: 0 10*3/uL (ref 0.0–0.1)
Basophils Relative: 1 %
Eosinophils Absolute: 0.1 10*3/uL (ref 0.0–0.5)
Eosinophils Relative: 1 %
HCT: 39.4 % (ref 36.0–46.0)
Hemoglobin: 13.1 g/dL (ref 12.0–15.0)
Immature Granulocytes: 0 %
Lymphocytes Relative: 16 %
Lymphs Abs: 0.7 10*3/uL (ref 0.7–4.0)
MCH: 32.8 pg (ref 26.0–34.0)
MCHC: 33.2 g/dL (ref 30.0–36.0)
MCV: 98.5 fL (ref 80.0–100.0)
Monocytes Absolute: 0.6 10*3/uL (ref 0.1–1.0)
Monocytes Relative: 14 %
Neutro Abs: 3.1 10*3/uL (ref 1.7–7.7)
Neutrophils Relative %: 68 %
Platelet Count: 143 10*3/uL — ABNORMAL LOW (ref 150–400)
RBC: 4 MIL/uL (ref 3.87–5.11)
RDW: 12.5 % (ref 11.5–15.5)
WBC Count: 4.5 10*3/uL (ref 4.0–10.5)
nRBC: 0 % (ref 0.0–0.2)

## 2019-04-02 LAB — CMP (CANCER CENTER ONLY)
ALT: 18 U/L (ref 0–44)
AST: 29 U/L (ref 15–41)
Albumin: 3.4 g/dL — ABNORMAL LOW (ref 3.5–5.0)
Alkaline Phosphatase: 96 U/L (ref 38–126)
Anion gap: 12 (ref 5–15)
BUN: 10 mg/dL (ref 8–23)
CO2: 23 mmol/L (ref 22–32)
Calcium: 8.6 mg/dL — ABNORMAL LOW (ref 8.9–10.3)
Chloride: 103 mmol/L (ref 98–111)
Creatinine: 0.9 mg/dL (ref 0.44–1.00)
GFR, Est AFR Am: >60 mL/min (ref >60)
GFR, Estimated: 60 mL/min — ABNORMAL LOW (ref >60)
Glucose, Bld: 96 mg/dL (ref 70–99)
Potassium: 4.3 mmol/L (ref 3.5–5.1)
Sodium: 138 mmol/L (ref 135–145)
Total Bilirubin: 0.9 mg/dL (ref 0.3–1.2)
Total Protein: 6.8 g/dL (ref 6.5–8.1)

## 2019-04-02 LAB — URIC ACID: Uric Acid, Serum: 6.1 mg/dL (ref 2.5–7.1)

## 2019-04-02 MED ORDER — ONDANSETRON HCL 4 MG/2ML IJ SOLN
8.0000 mg | Freq: Once | INTRAMUSCULAR | Status: AC
  Administered 2019-04-02: 8 mg via INTRAVENOUS

## 2019-04-02 MED ORDER — SODIUM CHLORIDE 0.9 % IV SOLN: 8.0000 mg | Freq: Once | INTRAVENOUS | Status: DC

## 2019-04-02 MED ORDER — SODIUM CHLORIDE 0.9 % IV SOLN
INTRAVENOUS | Status: AC
  Administered 2019-04-02: 11:00:00 via INTRAVENOUS
  Filled 2019-04-02 (×2): qty 250

## 2019-04-02 MED ORDER — SODIUM CHLORIDE 0.9 % IV SOLN
Freq: Once | INTRAVENOUS | Status: DC
  Filled 2019-04-02: qty 250

## 2019-04-02 MED ORDER — ONDANSETRON HCL 4 MG/2ML IJ SOLN
INTRAMUSCULAR | Status: AC
  Filled 2019-04-02: qty 4

## 2019-04-02 MED ORDER — ONDANSETRON HCL 4 MG/2ML IJ SOLN: 8.0000 mg | Freq: Once | INTRAMUSCULAR | Status: DC

## 2019-04-02 MED ORDER — ONDANSETRON HCL 8 MG PO TABS: 8.0000 mg | ORAL_TABLET | Freq: Three times a day (TID) | ORAL | 1 refills | Status: DC | PRN

## 2019-04-02 NOTE — Telephone Encounter (Signed)
Scheduled appt per 4/13 los. °

## 2019-04-02 NOTE — Addendum Note (Signed)
Addended by: Henreitta Leber E on: 04/02/2019 11:03 AM   Modules accepted: Orders

## 2019-04-02 NOTE — Progress Notes (Addendum)
Tracy Mclaughlin   Diagnosis: Non-Hodgkin's lymphoma  INTERVAL HISTORY:   Tracy Mclaughlin returns as scheduled.  She completed cycle 1 bendamustine/Rituxan 03/22/2019, 03/23/2019.  She began having nausea about 3 days after the chemotherapy.  The nausea has persisted.  Compazine is not effective.  She vomited last night.  More recently she developed diarrhea.  She has intermittent sharp pain at the left upper abdomen that resolves spontaneously. Oral intake is poor.  She reports chronic shortness of breath.  She has a cough which she attributes to allergies.  She thinks the peripheral lymph nodes are smaller.  She feels weak.  Objective:  Vital signs in last 24 hours:  Blood pressure (!) 117/44, pulse 84, temperature 100 F (37.8 C), temperature source Oral, resp. rate 18, height 5\' 7"  (1.702 m), SpO2 98 %.    HEENT: No thrush.  Mouth appears dry. Lymphatics: Small bilateral cervical and left supraclavicular lymph nodes.  Bilateral axillary lymph nodes are smaller. Resp: Lungs clear bilaterally. Cardio: Regular rate and rhythm. GI: Abdomen soft and nontender. Vascular: No leg edema. Neuro: Alert and oriented.   Lab Results:  Lab Results  Component Value Date   WBC 4.5 04/02/2019   HGB 13.1 04/02/2019   HCT 39.4 04/02/2019   MCV 98.5 04/02/2019   PLT 143 (L) 04/02/2019   NEUTROABS 3.1 04/02/2019    Imaging:  No results found.  Medications: I have reviewed the patient's current medications.  Assessment/Plan: 1. 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 Status post weekly Rituxan x4 03/01/2012 through 03/22/2012. She completed 4 "maintenance" doses of Rituxan, last on 12/19/2012. A restaging CT on 02/09/2013 showed no evidence of lymphoma.   Lymph node lateral to the thyroid bed on a neck ultrasound 02/21/2014, status post an FNA biopsy  concerning for a lymphoproliferative disorder.  PET scan 09/28/2016 with active lymphoma within the neck, chest, abdomen, pelvis; splenic enlargement and hypermetabolism suspicious for splenic involvement.  Initiation of Rituxan weekly 4 09/29/2016  Initiation of maintenance Rituxan on a 3 month schedule 12/23/2016; final Rituxan 08/31/2018  Thyroid ultrasound 02/07/2019-left cervical lymphadenopathy  PET scan 03/08/2019- extensive recurrent hypermetabolic lymphoma involving the neck, chest, abdomen and pelvis.  03/16/2019 left cervical lymph node biopsy- features consistent with previously diagnosed non-Hodgkin's B-cell lymphoma, phenotypically consistent with marginal zone lymphoma.  Flow cytometry with lambda restricted B-cell population without expression of CD5 or CD10 comprising 87% of all lymphocytes.  Cycle 1 bendamustine/Rituxan 03/22/2019 2. Stage IV (T1bN1b M0) papillary thyroid cancer, status post a thyroidectomy with reimplantation of the left superior parathyroid gland on 05/23/2012, status post radioactive iodine therapy, followed by Dr. Buddy Duty.  3. Stage II (T3 N0) colon cancer, status post a right colectomy 10/19/2011, last colonoscopy April 2015-sigmoid adenoma removed.  4. History of a pulmonary embolism December 2012.  5. History of Atrial fibrillation 6. Iron deficiency anemia-new 03/18/2014. Hemoccult positive stool. The anemia corrected with iron. No longer taking iron.  Status post an upper endoscopy and colonoscopy by Dr. Carlean Purl April 2015 with no bleeding source identified, benign adenoma removed from the sigmoid colon. 7. Report of an upper gastric intestinal bleed fall 2016-managed in [place]. [name] 8. Left knee replacement May 2017, repeat left knee surgery May 2018 9. Pruritic rash 07/22/2016 10.  Nausea and diarrhea 04/02/2019  Disposition: Tracy Mclaughlin is an 85 year old woman with non-Hodgkin's lymphoma with recent evidence of progression.  She completed cycle 1  bendamustine/Rituxan  beginning 03/22/2019.  Peripheral adenopathy is better.  She is having nausea and diarrhea of unclear etiology, possibly related to bendamustine.  She will submit a stool sample for C. difficile testing.  She received IV fluids and IV Zofran in the office today.  She noted marked improvement in the nausea and had no diarrhea while here.  We will send a prescription to her pharmacy for Zofran 8 mg every 8 hours as needed.  She will try to increase oral intake.  She will call the office tomorrow morning with an update on her condition.  She will return for lab, follow-up and cycle 2 bendamustine/Rituxan on 04/19/2019.  We are available to see her sooner if needed.  She will contact the office in the interim with any problems.  We specifically discussed persistent nausea, diarrhea.  Patient seen with Dr. Benay Spice.  30 minutes were spent face-to-face at today's visit with the majority of that time involved in counseling/coordination of care.    Ned Card ANP/GNP-BC   04/02/2019  12:53 PM  This was a shared visit with Ned Card.  Tracy Mclaughlin was interviewed and examined.  She is now at day 12 following cycle 1 bendamustine/rituximab.  The palpable lymph nodes have decreased in size. She has nausea and diarrhea.  This may be related to toxicity from the bendamustine.  She felt better after IV fluids and Zofran today.  She will submit a stool sample for C. difficile testing.  We have a low clinical suspicion for a Covid-19 infection.  Tracy Manson, MD

## 2019-04-03 ENCOUNTER — Inpatient Hospital Stay: Payer: Medicare PPO

## 2019-04-03 ENCOUNTER — Telehealth: Payer: Self-pay | Admitting: Nurse Practitioner

## 2019-04-03 DIAGNOSIS — C8518 Unspecified B-cell lymphoma, lymph nodes of multiple sites: Secondary | ICD-10-CM

## 2019-04-03 DIAGNOSIS — Z5112 Encounter for antineoplastic immunotherapy: Secondary | ICD-10-CM | POA: Diagnosis not present

## 2019-04-03 DIAGNOSIS — R197 Diarrhea, unspecified: Secondary | ICD-10-CM

## 2019-04-03 LAB — C DIFFICILE QUICK SCREEN W PCR REFLEX
C Diff antigen: NEGATIVE
C Diff interpretation: NOT DETECTED
C Diff toxin: NEGATIVE

## 2019-04-03 NOTE — Telephone Encounter (Signed)
I contacted Ms. Morella with the negative C. difficile result.  She will try Imodium.  I reviewed the dosing instructions.  She has had 3 loose stools so far today.  Nausea is some better.  She continues Zofran as needed.  She reports improvement in fluid intake.  No further temperature elevation.  When she checked her temperature this morning it was 97.2.  She feels chilled intermittently.  She understands to contact the office with persistent nausea/vomiting, diarrhea, fever, chills.

## 2019-04-04 ENCOUNTER — Telehealth: Payer: Self-pay

## 2019-04-04 NOTE — Telephone Encounter (Signed)
Returning TC from Ms. Frisch in reference to message she left for Lattie Haw in reference to how she was feeling today on the message Pt." Stated that she was feeling a little nauseous this morning and had a low grade fever 100.1 she stated that she did not have any diarrhea yet but she felt [place]" !. Message relayed to Lattie Haw informed Pt and her husband Per Lattie Haw to watch her temperature and to call if with  further problems or concerns. Pt.'s Husband verbalized understanding.

## 2019-04-06 ENCOUNTER — Telehealth: Payer: Self-pay | Admitting: Nurse Practitioner

## 2019-04-06 ENCOUNTER — Telehealth: Payer: Self-pay | Admitting: *Deleted

## 2019-04-06 NOTE — Telephone Encounter (Signed)
I contacted Ms. Folsom's husband.  She is having intermittent fever, chills.  She has persistent nausea.  Husband notes she is weak.  We offered an appointment in our office.  He is unable to bring her today due to the distance.  He would be able to take her to the PCP in [facility].  We contacted their office and spoke with the triage nurse.  They will contact her to arrange for an appointment today.  Most recent office note and labs faxed.

## 2019-04-06 NOTE — Telephone Encounter (Addendum)
Called home to follow up on patient's status: husband reports she is taking a nap currently. Early am her temp was 98.7 and few hours later was 101.1. He gave her #2 Aleve and she is resting. No nausea today or diarrhea. Ate some breakfast and drinking fluids. Temp last night went to 101.1 and she was very nauseated, but this passed and she took Tylenol and went to sleep and slept all night. Ned Card, NP spoke with husband and informed him that she needs to be seen today. He can't bring her here, but agrees to go to PCP if they can see her. NP called PCP and arranged for them to see her today. Last office note and labs were faxed to office at [phone].

## 2019-04-11 ENCOUNTER — Telehealth: Payer: Self-pay

## 2019-04-11 NOTE — Telephone Encounter (Signed)
Returned TC to pt in regard to wanting to know her next appointment date and times. I let her know that her next appointment was on Wednesday (04/18/19) her lab appointment is at 8:15a, and her appointment with Dr. Benay Spice is at 8:45a. Pt verbalized understanding. No further problems or concerns at this time.

## 2019-04-16 ENCOUNTER — Telehealth: Payer: Self-pay | Admitting: *Deleted

## 2019-04-16 NOTE — Telephone Encounter (Signed)
VM from husband that his wife is "very weak today". Scheduled for MD visit and possible treatment on 04/18/19. States "We need help. I think she needs hospitalization for her care and to administer the chemotherapy". Attempted to call back at 1115--no answer.

## 2019-04-17 ENCOUNTER — Telehealth: Payer: Self-pay | Admitting: *Deleted

## 2019-04-17 NOTE — Telephone Encounter (Signed)
Patient requesting after her appointment tomorrow that Dr. Benay Spice admit her to rehab to recover from chemo. She reports weakness, poor balance and not eating well due to poor appetite. She denies nausea or bowel problems. RN inquired about her ADLs: she is able to ambulate independently in home w/walker, able to dress and bathe herself with some assistance. Informed her that based on this, she does not qualify for rehab stay and insurance would not cover this. She will discuss further w/MD at appointment tomorrow.

## 2019-04-18 ENCOUNTER — Inpatient Hospital Stay (HOSPITAL_BASED_OUTPATIENT_CLINIC_OR_DEPARTMENT_OTHER): Payer: Medicare PPO | Admitting: Oncology

## 2019-04-18 ENCOUNTER — Telehealth: Payer: Self-pay | Admitting: Oncology

## 2019-04-18 ENCOUNTER — Other Ambulatory Visit: Payer: Self-pay

## 2019-04-18 ENCOUNTER — Inpatient Hospital Stay: Payer: Medicare PPO

## 2019-04-18 ENCOUNTER — Ambulatory Visit: Payer: Medicare PPO

## 2019-04-18 VITALS — BP 121/43 | HR 76 | Temp 98.2°F | Resp 18 | Ht 67.0 in | Wt 206.1 lb

## 2019-04-18 DIAGNOSIS — Z86711 Personal history of pulmonary embolism: Secondary | ICD-10-CM

## 2019-04-18 DIAGNOSIS — C8518 Unspecified B-cell lymphoma, lymph nodes of multiple sites: Secondary | ICD-10-CM

## 2019-04-18 DIAGNOSIS — Z5112 Encounter for antineoplastic immunotherapy: Secondary | ICD-10-CM | POA: Diagnosis not present

## 2019-04-18 DIAGNOSIS — R110 Nausea: Secondary | ICD-10-CM

## 2019-04-18 DIAGNOSIS — R599 Enlarged lymph nodes, unspecified: Secondary | ICD-10-CM | POA: Diagnosis not present

## 2019-04-18 DIAGNOSIS — C8261 Cutaneous follicle center lymphoma, lymph nodes of head, face, and neck: Secondary | ICD-10-CM | POA: Diagnosis not present

## 2019-04-18 DIAGNOSIS — C8267 Cutaneous follicle center lymphoma, spleen: Secondary | ICD-10-CM | POA: Diagnosis not present

## 2019-04-18 DIAGNOSIS — R630 Anorexia: Secondary | ICD-10-CM

## 2019-04-18 LAB — CMP (CANCER CENTER ONLY)
ALT: 10 U/L (ref 0–44)
AST: 29 U/L (ref 15–41)
Albumin: 2.9 g/dL — ABNORMAL LOW (ref 3.5–5.0)
Alkaline Phosphatase: 112 U/L (ref 38–126)
Anion gap: 11 (ref 5–15)
BUN: 15 mg/dL (ref 8–23)
CO2: 24 mmol/L (ref 22–32)
Calcium: 9.3 mg/dL (ref 8.9–10.3)
Chloride: 101 mmol/L (ref 98–111)
Creatinine: 0.94 mg/dL (ref 0.44–1.00)
GFR, Est AFR Am: >60 mL/min (ref >60)
GFR, Estimated: 56 mL/min — ABNORMAL LOW (ref >60)
Glucose, Bld: 97 mg/dL (ref 70–99)
Potassium: 4.6 mmol/L (ref 3.5–5.1)
Sodium: 136 mmol/L (ref 135–145)
Total Bilirubin: 1 mg/dL (ref 0.3–1.2)
Total Protein: 6.2 g/dL — ABNORMAL LOW (ref 6.5–8.1)

## 2019-04-18 LAB — CBC WITH DIFFERENTIAL (CANCER CENTER ONLY)
Abs Immature Granulocytes: 0.05 10*3/uL (ref 0.00–0.07)
Basophils Absolute: 0.1 10*3/uL (ref 0.0–0.1)
Basophils Relative: 1 %
Eosinophils Absolute: 0.1 10*3/uL (ref 0.0–0.5)
Eosinophils Relative: 1 %
HCT: 40.2 % (ref 36.0–46.0)
Hemoglobin: 13.1 g/dL (ref 12.0–15.0)
Immature Granulocytes: 1 %
Lymphocytes Relative: 13 %
Lymphs Abs: 1.3 10*3/uL (ref 0.7–4.0)
MCH: 31.2 pg (ref 26.0–34.0)
MCHC: 32.6 g/dL (ref 30.0–36.0)
MCV: 95.7 fL (ref 80.0–100.0)
Monocytes Absolute: 0.7 10*3/uL (ref 0.1–1.0)
Monocytes Relative: 7 %
Neutro Abs: 7.5 10*3/uL (ref 1.7–7.7)
Neutrophils Relative %: 77 %
Platelet Count: 135 10*3/uL — ABNORMAL LOW (ref 150–400)
RBC: 4.2 MIL/uL (ref 3.87–5.11)
RDW: 13.2 % (ref 11.5–15.5)
WBC Count: 9.7 10*3/uL (ref 4.0–10.5)
nRBC: 0 % (ref 0.0–0.2)

## 2019-04-18 LAB — LACTATE DEHYDROGENASE: LDH: 641 U/L — ABNORMAL HIGH (ref 98–192)

## 2019-04-18 LAB — URIC ACID: Uric Acid, Serum: 6.4 mg/dL (ref 2.5–7.1)

## 2019-04-18 NOTE — Telephone Encounter (Signed)
Scheduled appt per 4/29 los.  Added treatment in the book for approval (5/5). When the treatment get added, I will schedule the second day treatment.

## 2019-04-18 NOTE — Progress Notes (Signed)
Nutrition Assessment   Reason for Assessment:   Referral from Manuela Schwartz, RN for weight loss, poor appetite.    ASSESSMENT:   RD working remotely.  85 year old female with lymphoma followed by Dr. Benay Spice.  Patient is receiving chemotherapy. Past medical history of stage IV thyroid cancer, s/p thyroidectomy, afib, colon cancer with right colectomy. Noted recent UTI on antibiotics with nausea, anorexia, diarrhea (c-diff negative)  Called and spoke with husband, Arlen.  Patient sleeping at the time of call.  Husband reports that patient with poor appetite. Reports that he fixes her something and she eats 2-3 bites and then that is all she is able to eat.  Reports some issues with nausea.  Reports cleaned out bedside commode just a little bit ago and looked like diarrhea.  Husband thinks she is taking nausea medication and diarrhea medication.  Patient has not started oral nutrition supplements   Nutrition Focused Physical Exam: deferred   Medications: zofran, compazine   Labs: reviewed   Anthropometrics:   Height: 67 inches Weight: 206 lb 1.6 oz Noted weight of 224 lb on 4/1 BMI: 32  8% weight loss in the last month, significant   Estimated Energy Needs  Kcals: 2350-2820  Protein: 92-122 g Fluid: 2.3 L   NUTRITION DIAGNOSIS: Unintentional weight loss related to nausea, diarrhea, poor appetite as evidenced by 8% weight loss in 1 month.     INTERVENTION:  Discussed oral nutrition supplement options with husband.  Encouraged 350 calorie or higher shake.  Discussed ways to increase calories and protein with husband.  Will email patient information Will also email handout on nausea and vomiting.   Contact information provided and husband said patient will call or email if further information needed. Appreciative of call.   MONITORING, EVALUATION, GOAL: Patient will consume adequate calories and protein to maintain weight   Next Visit: patient to reach out if needed per  husband  Jamesa Tedrick B. Zenia Resides, [place], [place] Registered Dietitian [phone] (pager)

## 2019-04-18 NOTE — Progress Notes (Signed)
Tracy Mclaughlin   Diagnosis: Non-Hodgkin's lymphoma  INTERVAL HISTORY:   Tracy Mclaughlin returns for scheduled visit.  When we saw her 2 weeks ago she had a fever, diarrhea, and failure to thrive.  A C. difficile toxin returned negative.  She reports the diarrhea and fever have resolved. She saw her primary provider and reports a chest x-ray was negative.  COVID testing was negative.  She was treated for urinary tract infection.  The antibiotic caused nausea.  She continues to have nausea and anorexia.  She has chronic hot flashes.  She has not noted a change in the palpable lymph nodes.  Objective:  Vital signs in last 24 hours:  Blood pressure (!) 121/43, pulse 76, temperature 98.2 F (36.8 C), temperature source Oral, resp. rate 18, height 5\' 7"  (1.702 m), SpO2 95 %.    HEENT: No thrush or ulcers, mild erythema of the tongue and buccal mucosa Lymphatics: Soft mobile bilaterally cervical and axillary nodes.  No inguinal nodes.  The largest node is a 3-4 cm medial left axillary node. Resp: Lungs clear bilaterally Cardio: Regular rate and rhythm GI: No hepatosplenomegaly, nontender Vascular: No leg edema    Lab Results:  Lab Results  Component Value Date   WBC 9.7 04/18/2019   HGB 13.1 04/18/2019   HCT 40.2 04/18/2019   MCV 95.7 04/18/2019   PLT 135 (L) 04/18/2019   NEUTROABS 7.5 04/18/2019    CMP  Lab Results  Component Value Date   NA 136 04/18/2019   K 4.6 04/18/2019   CL 101 04/18/2019   CO2 24 04/18/2019   GLUCOSE 97 04/18/2019   BUN 15 04/18/2019   CREATININE 0.94 04/18/2019   CALCIUM 9.3 04/18/2019   PROT 6.2 (L) 04/18/2019   ALBUMIN 2.9 (L) 04/18/2019   AST 29 04/18/2019   ALT 10 04/18/2019   ALKPHOS 112 04/18/2019   BILITOT 1.0 04/18/2019   GFRNONAA 56 (L) 04/18/2019   GFRAA >60 04/18/2019     Medications: I have reviewed the patient's current medications.   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 Status post weekly Rituxan x4 03/01/2012 through 03/22/2012. She completed 4 "maintenance" doses of Rituxan, last on 12/19/2012. A restaging CT on 02/09/2013 showed no evidence of lymphoma.   Lymph node lateral to the thyroid bed on a neck ultrasound 02/21/2014, status post an FNA biopsy concerning for a lymphoproliferative disorder.  PET scan 09/28/2016 with active lymphoma within the neck, chest, abdomen, pelvis; splenic enlargement and hypermetabolism suspicious for splenic involvement.  Initiation of Rituxan weekly 4 09/29/2016  Initiation of maintenance Rituxan on a 3 month schedule 12/23/2016; final Rituxan 08/31/2018  Thyroid ultrasound 02/07/2019-left cervical lymphadenopathy  PET scan 03/08/2019- extensive recurrent hypermetabolic lymphoma involving the neck, chest, abdomen and pelvis.  03/16/2019 left cervical lymph node biopsy- features consistent with previously diagnosed non-Hodgkin's B-cell lymphoma, phenotypically consistent with marginal zone lymphoma.  Flow cytometry with lambda restricted B-cell population without expression of CD5 or CD10 comprising 87% of all lymphocytes.  Cycle 1 bendamustine/Rituxan 03/22/2019 2. Stage IV (T1bN1b M0) papillary thyroid cancer, status post a thyroidectomy with reimplantation of the left superior parathyroid gland on 05/23/2012, status post radioactive iodine therapy, followed by Dr. Buddy Duty.  3. Stage II (T3 N0) colon cancer, status post a right colectomy 10/19/2011, last colonoscopy April 2015-sigmoid adenoma removed.  4. History of a pulmonary embolism December 2012.  5. History  of Atrial fibrillation 6. Iron deficiency anemia-new 03/18/2014. Hemoccult positive stool. The anemia corrected with iron. No longer taking iron.  Status post an upper endoscopy and colonoscopy by Dr. Carlean Purl April 2015 with no bleeding source identified,  benign adenoma removed from the sigmoid colon. 7. Report of an upper gastric intestinal bleed fall 2016-managed in [place]. [name] 8. Left knee replacement May 2017, repeat left knee surgery May 2018 9. Pruritic rash 07/22/2016 10.  Nausea and diarrhea 04/02/2019-stool negative for C. difficile toxin   Disposition: Tracy Mclaughlin has completed 1 cycle of bendamustine/rituximab.  She continues to have palpable lymph nodes in the neck and axillae.  Her performance status remains poor.  It is unclear whether her symptoms are related to toxicity from systemic therapy, recovery from a recent infection, or persistence/progression of lymphoma.  We checked an LDH today.  We will follow-up on records from her primary provider.  Chemotherapy will be held this week.  I encouraged her to begin nutrition supplements.  She will return for an office visit and further evaluation in 1 week.  We will consider an excisional lymph node biopsy if she does not have a clear response to the bendamustine/rituximab.  Tracy Coder, MD  04/18/2019  9:33 AM

## 2019-04-19 ENCOUNTER — Ambulatory Visit: Payer: Medicare PPO

## 2019-04-20 ENCOUNTER — Ambulatory Visit: Payer: Medicare PPO

## 2019-04-20 ENCOUNTER — Telehealth: Payer: Self-pay | Admitting: Oncology

## 2019-04-20 NOTE — Telephone Encounter (Signed)
Called patient to inform patient that their treatment has been added.  Patient aware of appts.

## 2019-04-24 ENCOUNTER — Inpatient Hospital Stay: Payer: Medicare PPO | Attending: Oncology | Admitting: Nurse Practitioner

## 2019-04-24 ENCOUNTER — Telehealth: Payer: Self-pay | Admitting: Nurse Practitioner

## 2019-04-24 ENCOUNTER — Inpatient Hospital Stay: Payer: Medicare PPO

## 2019-04-24 ENCOUNTER — Other Ambulatory Visit: Payer: Self-pay

## 2019-04-24 ENCOUNTER — Encounter: Payer: Self-pay | Admitting: Nurse Practitioner

## 2019-04-24 VITALS — BP 105/49 | HR 75 | Temp 98.1°F | Resp 17 | Ht 67.0 in | Wt 203.0 lb

## 2019-04-24 DIAGNOSIS — R0602 Shortness of breath: Secondary | ICD-10-CM | POA: Diagnosis not present

## 2019-04-24 DIAGNOSIS — C8261 Cutaneous follicle center lymphoma, lymph nodes of head, face, and neck: Secondary | ICD-10-CM | POA: Diagnosis not present

## 2019-04-24 DIAGNOSIS — Z5189 Encounter for other specified aftercare: Secondary | ICD-10-CM | POA: Insufficient documentation

## 2019-04-24 DIAGNOSIS — C8518 Unspecified B-cell lymphoma, lymph nodes of multiple sites: Secondary | ICD-10-CM

## 2019-04-24 DIAGNOSIS — Z5111 Encounter for antineoplastic chemotherapy: Secondary | ICD-10-CM | POA: Insufficient documentation

## 2019-04-24 DIAGNOSIS — Z5112 Encounter for antineoplastic immunotherapy: Secondary | ICD-10-CM | POA: Insufficient documentation

## 2019-04-24 DIAGNOSIS — C8267 Cutaneous follicle center lymphoma, spleen: Secondary | ICD-10-CM | POA: Diagnosis present

## 2019-04-24 DIAGNOSIS — Z86711 Personal history of pulmonary embolism: Secondary | ICD-10-CM | POA: Insufficient documentation

## 2019-04-24 DIAGNOSIS — G620 Drug-induced polyneuropathy: Secondary | ICD-10-CM | POA: Diagnosis not present

## 2019-04-24 DIAGNOSIS — R599 Enlarged lymph nodes, unspecified: Secondary | ICD-10-CM | POA: Diagnosis not present

## 2019-04-24 DIAGNOSIS — R740 Nonspecific elevation of levels of transaminase and lactic acid dehydrogenase [LDH]: Secondary | ICD-10-CM | POA: Diagnosis not present

## 2019-04-24 DIAGNOSIS — R05 Cough: Secondary | ICD-10-CM | POA: Insufficient documentation

## 2019-04-24 DIAGNOSIS — R531 Weakness: Secondary | ICD-10-CM

## 2019-04-24 DIAGNOSIS — R630 Anorexia: Secondary | ICD-10-CM

## 2019-04-24 LAB — CMP (CANCER CENTER ONLY)
ALT: 12 U/L (ref 0–44)
AST: 32 U/L (ref 15–41)
Albumin: 3 g/dL — ABNORMAL LOW (ref 3.5–5.0)
Alkaline Phosphatase: 100 U/L (ref 38–126)
Anion gap: 10 (ref 5–15)
BUN: 14 mg/dL (ref 8–23)
CO2: 26 mmol/L (ref 22–32)
Calcium: 10.2 mg/dL (ref 8.9–10.3)
Chloride: 100 mmol/L (ref 98–111)
Creatinine: 1.17 mg/dL — ABNORMAL HIGH (ref 0.44–1.00)
GFR, Est AFR Am: 50 mL/min — ABNORMAL LOW (ref >60)
GFR, Estimated: 43 mL/min — ABNORMAL LOW (ref >60)
Glucose, Bld: 108 mg/dL — ABNORMAL HIGH (ref 70–99)
Potassium: 4.5 mmol/L (ref 3.5–5.1)
Sodium: 136 mmol/L (ref 135–145)
Total Bilirubin: 1.1 mg/dL (ref 0.3–1.2)
Total Protein: 6.2 g/dL — ABNORMAL LOW (ref 6.5–8.1)

## 2019-04-24 LAB — CBC WITH DIFFERENTIAL (CANCER CENTER ONLY)
Abs Immature Granulocytes: 0.06 10*3/uL (ref 0.00–0.07)
Basophils Absolute: 0.1 10*3/uL (ref 0.0–0.1)
Basophils Relative: 1 %
Eosinophils Absolute: 0.2 10*3/uL (ref 0.0–0.5)
Eosinophils Relative: 2 %
HCT: 39.9 % (ref 36.0–46.0)
Hemoglobin: 13 g/dL (ref 12.0–15.0)
Immature Granulocytes: 1 %
Lymphocytes Relative: 19 %
Lymphs Abs: 1.9 10*3/uL (ref 0.7–4.0)
MCH: 31.7 pg (ref 26.0–34.0)
MCHC: 32.6 g/dL (ref 30.0–36.0)
MCV: 97.3 fL (ref 80.0–100.0)
Monocytes Absolute: 0.9 10*3/uL (ref 0.1–1.0)
Monocytes Relative: 9 %
Neutro Abs: 7 10*3/uL (ref 1.7–7.7)
Neutrophils Relative %: 68 %
Platelet Count: 233 10*3/uL (ref 150–400)
RBC: 4.1 MIL/uL (ref 3.87–5.11)
RDW: 13.8 % (ref 11.5–15.5)
WBC Count: 10.2 10*3/uL (ref 4.0–10.5)
nRBC: 0 % (ref 0.0–0.2)

## 2019-04-24 LAB — LACTATE DEHYDROGENASE: LDH: 553 U/L — ABNORMAL HIGH (ref 98–192)

## 2019-04-24 NOTE — Telephone Encounter (Signed)
Scheduled appt per 5/5 los. ° ° °

## 2019-04-24 NOTE — Progress Notes (Addendum)
Tracy Mclaughlin   Diagnosis: Non-Hodgkin's lymphoma  INTERVAL HISTORY:   Tracy Mclaughlin returns as scheduled.  She continues to feel weak.  Lymph nodes are stable.  She has stable shortness of breath and cough.  No fevers or sweats.  Appetite is poor.  No diarrhea.  No urinary symptoms.  She is able to ambulate with a walker.  Objective:  Vital signs in last 24 hours:  Blood pressure (!) 105/49, pulse 75, temperature 98.1 F (36.7 C), temperature source Oral, resp. rate 17, height 5\' 7"  (1.702 m), weight 203 lb (92.1 kg), SpO2 96 %.    HEENT: No thrush or ulcers. Lymphatics: Bilateral cervical and axillary lymph nodes.  Largest node is in the left axilla. Resp: Lungs clear bilaterally. Cardio: Regular rate and rhythm. GI: Abdomen soft and nontender. Vascular: No leg edema.   Lab Results:  Lab Results  Component Value Date   WBC 10.2 04/24/2019   HGB 13.0 04/24/2019   HCT 39.9 04/24/2019   MCV 97.3 04/24/2019   PLT 233 04/24/2019   NEUTROABS 7.0 04/24/2019    Imaging:  No results found.  Medications: I have reviewed the patient's current medications.  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 Status post weekly Rituxan x4 03/01/2012 through 03/22/2012. She completed 4 "maintenance" doses of Rituxan, last on 12/19/2012. A restaging CT on 02/09/2013 showed no evidence of lymphoma.   Lymph node lateral to the thyroid bed on a neck ultrasound 02/21/2014, status post an FNA biopsy concerning for a lymphoproliferative disorder.  PET scan 09/28/2016 with active lymphoma within the neck, chest, abdomen, pelvis; splenic enlargement and hypermetabolism suspicious for splenic involvement.  Initiation of Rituxan weekly 4 09/29/2016  Initiation of maintenance Rituxan on a 3 month schedule 12/23/2016; final Rituxan 08/31/2018   Thyroid ultrasound 02/07/2019-left cervical lymphadenopathy  PET scan 03/08/2019- extensive recurrent hypermetabolic lymphoma involving the neck, chest, abdomen and pelvis.  03/16/2019 left cervical lymph node biopsy- features consistent with previously diagnosed non-Hodgkin's B-cell lymphoma, phenotypically consistent with marginal zone lymphoma. Flow cytometry with lambda restricted B-cell population without expression of CD5 or CD10 comprising 87% of all lymphocytes.  Cycle 1 bendamustine/Rituxan 03/22/2019 2. Stage IV (T1bN1b M0) papillary thyroid cancer, status post a thyroidectomy with reimplantation of the left superior parathyroid gland on 05/23/2012, status post radioactive iodine therapy, followed by Dr. Buddy Duty.  3. Stage II (T3 N0) colon cancer, status post a right colectomy 10/19/2011, last colonoscopy April 2015-sigmoid adenoma removed.  4. History of a pulmonary embolism December 2012.  5. History of Atrial fibrillation 6. Iron deficiency anemia-new 03/18/2014. Hemoccult positive stool. The anemia corrected with iron. No longer taking iron.  Status post an upper endoscopy and colonoscopy by Dr. Carlean Purl April 2015 with no bleeding source identified, benign adenoma removed from the sigmoid colon. 7. Report of an upper gastric intestinal bleed fall 2016-managed in [place]. [name] 8. Left knee replacement May 2017, repeat left knee surgery May 2018 9. Pruritic rash 07/22/2016 10.  Nausea and diarrhea 04/02/2019-stool negative for C. difficile toxin   Disposition: Tracy Mclaughlin has completed 1 cycle of bendamustine/Rituxan.  She continues to have a poor performance status.  LDH remains elevated.  She has persistent peripheral adenopathy.  We are holding today's treatment and referring her for an excisional lymph node biopsy.  She will return for a follow-up visit in approximately 1 week.  A decision will be made on the  treatment regimen based on the lymph node biopsy.  We offered hospitalization  to expedite the evaluation.  Due to COVID-19 she declines this option.  She understands to contact the office if her condition worsens prior to the next visit.  Patient seen with Dr. Benay Spice.  25 minutes were spent face-to-face at today's visit with the majority of that time involved in counseling/coordination of care.    Ned Card ANP/GNP-BC   04/24/2019  9:07 AM This was a shared visit with Ned Card.  Tracy Mclaughlin continues to have a poor performance status, elevated LDH, and palpable lymphadenopathy.  She has not improved following 1 cycle of bendamustine/rituximab.  We discussed treatment options with Tracy Mclaughlin.  We decided to hold further bendamustine/rituximab.  She will be referred for an excisional lymph node biopsy to be sure she has not developed a transformation of the low-grade lymphoma.  Julieanne Manson, MD

## 2019-04-25 ENCOUNTER — Inpatient Hospital Stay: Payer: Medicare PPO

## 2019-04-26 ENCOUNTER — Other Ambulatory Visit: Payer: Self-pay | Admitting: General Surgery

## 2019-04-29 NOTE — H&P (Signed)
Tracy Mclaughlin Location: [facility] Patient #: [id number] DOB: 07/31/1934 Married / Language: English / Race: White Female        History of Present Illness      . This is an 85 year old female, referred by [name] for consideration of lymph node biopsy to clarify her lymphoma. Her husband is with her throughout the encounter. Hank .Boisselle is Her cardiologist. Dr. Delrae Rend is her endocrinologist      I have known her for many years. I performed her total thyroidectomy in June, 2012 for pathologic stage TIb, N1 B multifocal papillary thyroid carcinoma. Postop parathyroid function was good. At the car supervised her radioiodine ablation and follows her regularly with no known recurrence. She has had B-cell lymphoma for many years. She had right colon cancer resected in [place].     Recently she's had some fever diarrhea and failure to thrive. She and her husband says she's been in decline for about 6 months. Increasing somnolence and memory. The diarrhea has resolved. C. difficile was negative. Chest x-ray was unremarkable. Recent COVID test was negative. She saw Dr. Daneen Schick on January 30, 2019 and she seemed fairly stable with no recurrence of her atrial fibrillation and good blood pressure control. Remote history of pulmonary embolism but no recurrence there. No change in therapy. Follow-up in 12 months. She's had upper endoscopy and colonoscopy in the last 3 years. She had a subdural hematoma and craniotomy following a skiing accident in 1990. She subsequently has subarachnoid hemorrhage that was treated nonoperatively. She is not on any anticoagulation.     In February ultrasound of the head and neck showed cervical adenopathy. In March PET scan showed extensive recurrent hypermetabolic lymphoma involving neck chest abdomen and pelvis. Core biopsy of left cervical neck node showed non-Hodgkin's B-cell lymphoma, consistent with marginal  zone lymphoma. She's not been responding. Dr. Benay Spice wants an entire lymph node removed to subclassify her lymphoma      She and her husband are in the office today. She was ambulating with a walker. She carried on a good conversation but memory was not as good. She very much wants to do something to try to help her situation and to feel better. So does her husband.     Exam revealed palpable adenopathy in the supraclavicular area and in the left axilla.     She will be scheduled for excision deep left axillary lymph node. Probably lower risk for wound complications there. General anesthesia. We will try for outpatient status I told her that if she was extremely confused following emergence from anesthesia might have to admit her and she understands that. I discussed the indications, details, please, numerous risk of the surgery with the patient and her husband. She is aware of the risk of bleeding, infection, shoulder disability, arm numbness, seroma formation, cardiac pulmonary thromboembolic and neurologic problems. She understands all these issues. All of her questions are answered. She agrees with this plan.  This needs to be scheduled urgently due to her deterioration        Past Surgical History  Shoulder Surgery  Right.  Allergies  Demerol *ANALGESICS - OPIOID*   Medication History  Allopurinol (100MG  Tablet, Oral) Active. Cefuroxime Axetil (500MG  Tablet, Oral) Active. dilTIAZem HCl ER Coated Beads (180MG  Capsule ER 24HR, Oral) Active. Losartan Potassium (100MG  Tablet, Oral) Active. Ondansetron HCl (8MG  Tablet, Oral) Active. Synthroid (112MCG Tablet, Oral) Active. Trintellix (5MG  Tablet, Oral) Active. Medications Reconciled  Social History  Alcohol use  Moderate alcohol use.  Family History  Alcohol Abuse  Father.  Other Problems  Atrial Fibrillation  Colon Cancer  Diverticulosis     Review of Systems  General Present- Appetite Loss,  Fatigue and Weight Loss. Not Present- Chills, Fever, Night Sweats and Weight Gain. Neurological Present- Decreased Memory and Weakness. Not Present- Fainting, Headaches, Numbness, Seizures, Tingling, Tremor and Trouble walking.  Vitals  Weight: 203 lb Height: 67.5in Weight was reported by patient. Body Surface Area: 2.05 m Body Mass Index: 31.32 kg/m  Temp.: 98.29F(Oral)  Pulse: 92 (Regular)  BP: 84/54 (Sitting, Left Arm, Standard)       Physical Exam  General Mental Status-Alert. General Appearance-Consistent with stated age. Hydration-Well hydrated. Voice-Normal. Note: Ambulated and a walker. This examined in a wheelchair. Alert but does not look very vigorous. Speech is normal but has some memory problems. She is perfectly aware of her situation and very competent to grant informed consent   Head and Neck Note: Thyroidectomy scar well-healed. No mass in the paratracheal area. Left supraclavicular adenopathy.   Eye Eyeball - Bilateral-Extraocular movements intact. Sclera/Conjunctiva - Bilateral-No scleral icterus.  Chest and Lung Exam Chest and lung exam reveals -quiet, even and easy respiratory effort with no use of accessory muscles and on auscultation, normal breath sounds, no adventitious sounds and normal vocal resonance. Inspection Chest Wall - Normal. Back - normal. Note: There are at least 2 mobile lymph nodes in the left axilla, possibly as largest 3 cm. Right axilla is less impressive   Cardiovascular Cardiovascular examination reveals -normal heart sounds, regular rate and rhythm with no murmurs and normal pedal pulses bilaterally.  Abdomen Inspection Inspection of the abdomen reveals - No Hernias. Palpation/Percussion Palpation and Percussion of the abdomen reveal - Soft, Non Tender, No Rebound tenderness, No Rigidity (guarding) and No hepatosplenomegaly. Auscultation Auscultation of the abdomen reveals - Bowel sounds  normal.  Neurologic Neurologic evaluation reveals -alert and oriented x 3 with no impairment of recent or remote memory. Mental Status-Normal.  Musculoskeletal Normal Exam - Left-Upper Extremity Strength Normal and Lower Extremity Strength Normal. Normal Exam - Right-Upper Extremity Strength Normal and Lower Extremity Strength Normal.  Lymphatic Head & Neck  General Head & Neck Lymphatics: Bilateral - Description - Normal. Axillary  General Axillary Region: Bilateral - Description - Normal. Tenderness - Non Tender. Femoral & Inguinal  Generalized Femoral & Inguinal Lymphatics: Bilateral - Description - Normal. Tenderness - Non Tender.    Assessment & Plan  NON-HODGKIN'S LYMPHOMA IN ADULT (C85.90)   You have a non-Hodgkin's lymphoma Dr. Benay Spice states that you were not responding appropriately He needs one of your large lymph nodes to be removed to clarify the type of lymphoma that you have  We will we'll schedule you for excision deep left axillary lymph node. This will be done as an outpatient surgery but we will need to be put to sleep temporarily. I discussed the indications, techniques, and numerous risk of the surgery with you and your husband  AXILLARY ADENOPATHY (R59.0) PAROXYSMAL ATRIAL FIBRILLATION (I48.0) HISTORY OF THYROID CANCER (Z85.850) HYPERTENSION, ESSENTIAL (I10) HISTORY OF SUBDURAL HEMATOMA (POST TRAUMATIC) (Z87.828) HISTORY OF PULMONARY EMBOLISM (Z86.711) HISTORY OF COLON CANCER IN ADULTHOOD (I95.188)    Briellah Baik M. Dalbert Batman, M.D., [facility] General and Minimally invasive Surgery Breast and Colorectal Surgery Office:   [phone] Pager:   [phone]

## 2019-04-30 NOTE — Patient Instructions (Addendum)
Tracy Mclaughlin  04/30/2019   Your procedure is scheduled on: 05-04-19    Report to [facility] Main  Entrance    Report to Admitting at 11:17 AM   YOU NEED TO HAVE A COVID 19 TEST ON 04-30-09 . THIS TEST MUST BE DONE BEFORE SURGERY, COME TO [facility] ENTRANCE BETWEEN THE HOURS OF 900 AM AND 300 PM ON YOUR COVID TEST DATE.   Call this number if you have problems the morning of surgery [phone]    Remember: You may have a Clear Liquid Diet from Midnight until 7:15 AM. After 7:15 AM, nothing until after surgery.     CLEAR LIQUID DIET   Foods Allowed                                                                     Foods Excluded  Coffee and tea, regular and decaf                             liquids that you cannot  Plain Jell-O in any flavor                                             see through such as: Fruit ices (not with fruit pulp)                                     milk, soups, orange juice  Iced Popsicles                                    All solid food Carbonated beverages, regular and diet                                    Cranberry, grape and apple juices Sports drinks like Gatorade Lightly seasoned clear broth or consume(fat free) Sugar, honey syrup  Sample Menu Breakfast                                Lunch                                     Supper Cranberry juice                    Beef broth                            Chicken broth Jell-O  Grape juice                           Apple juice Coffee or tea                        Jell-O                                      Popsicle                                                Coffee or tea                        Coffee or tea  _____________________________________________________________________     BRUSH YOUR TEETH MORNING OF SURGERY AND RINSE YOUR MOUTH OUT, NO CHEWING GUM CANDY OR MINTS.     Take these  medicines the morning of surgery with A SIP OF WATER: Celexa (Citalopram), Diltiazem (Coreg) and Levothyroxine (Synthroid)                                You may not have any metal on your body including hair pins and              piercings     Do not wear jewelry, make-up, lotions, powders or perfumes, deodorant               Do not wear nail polish.  Do not shave  48 hours prior to surgery.             .   Do not bring valuables to the hospital. [place].  Contacts, dentures or bridgework may not be worn into surgery.      Patients discharged the day of surgery will not be allowed to drive home. IF YOU ARE HAVING SURGERY AND GOING HOME THE SAME DAY, YOU MUST HAVE AN ADULT TO DRIVE YOU HOME AND BE WITH YOU FOR 24 HOURS. YOU MAY GO HOME BY TAXI OR UBER OR ORTHERWISE, BUT AN ADULT MUST ACCOMPANY YOU HOME AND STAY WITH YOU FOR 24 HOURS.    Name and phone number of your driver: Arlen [phone]                Please read over the following fact sheets you were given: _____________________________________________________________________             [facility] - Preparing for Surgery Before surgery, you can play an important role.  Because skin is not sterile, your skin needs to be as free of germs as possible.  You can reduce the number of germs on your skin by washing with CHG (chlorahexidine gluconate) soap before surgery.  CHG is an antiseptic cleaner which kills germs and bonds with the skin to continue killing germs even after washing. Please DO NOT use if you have an allergy to CHG or antibacterial soaps.  If your skin becomes reddened/irritated stop using the CHG and inform your nurse when you arrive at Short Stay. Do not  shave (including legs and underarms) for at least 48 hours prior to the first CHG shower.  You may shave your face/neck. Please follow these instructions carefully:  1.  Shower with CHG Soap the night before  surgery and the  morning of Surgery.  2.  If you choose to wash your hair, wash your hair first as usual with your  normal  shampoo.  3.  After you shampoo, rinse your hair and body thoroughly to remove the  shampoo.                           4.  Use CHG as you would any other liquid soap.  You can apply chg directly  to the skin and wash                       Gently with a scrungie or clean washcloth.  5.  Apply the CHG Soap to your body ONLY FROM THE NECK DOWN.   Do not use on face/ open                           Wound or open sores. Avoid contact with eyes, ears mouth and genitals (private parts).                       Wash face,  Genitals (private parts) with your normal soap.             6.  Wash thoroughly, paying special attention to the area where your surgery  will be performed.  7.  Thoroughly rinse your body with warm water from the neck down.  8.  DO NOT shower/wash with your normal soap after using and rinsing off  the CHG Soap.                9.  Pat yourself dry with a clean towel.            10.  Wear clean pajamas.            11.  Place clean sheets on your bed the night of your first shower and do not  sleep with pets. Day of Surgery : Do not apply any lotions/deodorants the morning of surgery.  Please wear clean clothes to the hospital/surgery center.  FAILURE TO FOLLOW THESE INSTRUCTIONS MAY RESULT IN THE CANCELLATION OF YOUR SURGERY PATIENT SIGNATURE_________________________________  NURSE SIGNATURE__________________________________  ________________________________________________________________________

## 2019-04-30 NOTE — Progress Notes (Signed)
SPOKE W/ [name]. Pt was asleep. Husband reported that she had a COVID-19 test on      SCREENING SYMPTOMS OF COVID 19:   COUGH--No  RUNNY NOSE--- No  SORE THROAT---No  NASAL CONGESTION----No  SNEEZING----No  SHORTNESS OF BREATH---Yes  DIFFICULTY BREATHING---No  TEMP >100.0 -----No  UNEXPLAINED BODY ACHES------No  CHILLS -------- No  HEADACHES ---------No  LOSS OF SMELL/ TASTE --------No    HAVE YOU OR ANY FAMILY MEMBER TRAVELLED PAST 14 DAYS OUT OF THE   COUNTY---No STATE----No Country-No (Family lives in [place], [place] to [place] )  [place] ANYONE WITH COVID 19? No

## 2019-04-30 NOTE — Progress Notes (Signed)
01-30-19 ([place]) LOV with Cardiologist Dr. Tamala Julian &EKG  04-06-19 ([place]) CXR

## 2019-05-01 ENCOUNTER — Other Ambulatory Visit: Payer: Self-pay

## 2019-05-01 ENCOUNTER — Encounter (HOSPITAL_COMMUNITY)
Admission: RE | Admit: 2019-05-01 | Discharge: 2019-05-01 | Disposition: A | Discharge status: Home / Self Care | Payer: Medicare PPO | Source: Ambulatory Visit | Attending: General Surgery | Admitting: General Surgery

## 2019-05-01 ENCOUNTER — Encounter (HOSPITAL_COMMUNITY): Payer: Self-pay

## 2019-05-01 ENCOUNTER — Other Ambulatory Visit (HOSPITAL_COMMUNITY)
Admission: RE | Admit: 2019-05-01 | Discharge: 2019-05-01 | Disposition: A | Discharge status: Home / Self Care | Payer: Medicare PPO | Source: Ambulatory Visit | Attending: General Surgery | Admitting: General Surgery

## 2019-05-01 DIAGNOSIS — Z86711 Personal history of pulmonary embolism: Secondary | ICD-10-CM | POA: Diagnosis not present

## 2019-05-01 DIAGNOSIS — Z87891 Personal history of nicotine dependence: Secondary | ICD-10-CM | POA: Diagnosis not present

## 2019-05-01 DIAGNOSIS — E890 Postprocedural hypothyroidism: Secondary | ICD-10-CM | POA: Diagnosis not present

## 2019-05-01 DIAGNOSIS — Z85850 Personal history of malignant neoplasm of thyroid: Secondary | ICD-10-CM | POA: Diagnosis not present

## 2019-05-01 DIAGNOSIS — Z886 Allergy status to analgesic agent status: Secondary | ICD-10-CM | POA: Diagnosis not present

## 2019-05-01 DIAGNOSIS — Z79890 Hormone replacement therapy (postmenopausal): Secondary | ICD-10-CM | POA: Diagnosis not present

## 2019-05-01 DIAGNOSIS — Z79899 Other long term (current) drug therapy: Secondary | ICD-10-CM | POA: Diagnosis not present

## 2019-05-01 DIAGNOSIS — Z1159 Encounter for screening for other viral diseases: Secondary | ICD-10-CM | POA: Diagnosis not present

## 2019-05-01 DIAGNOSIS — Z85038 Personal history of other malignant neoplasm of large intestine: Secondary | ICD-10-CM | POA: Diagnosis not present

## 2019-05-01 DIAGNOSIS — I10 Essential (primary) hypertension: Secondary | ICD-10-CM | POA: Diagnosis not present

## 2019-05-01 DIAGNOSIS — I480 Paroxysmal atrial fibrillation: Secondary | ICD-10-CM | POA: Diagnosis not present

## 2019-05-01 DIAGNOSIS — Z9889 Other specified postprocedural states: Secondary | ICD-10-CM | POA: Diagnosis not present

## 2019-05-01 DIAGNOSIS — Z01812 Encounter for preprocedural laboratory examination: Secondary | ICD-10-CM | POA: Insufficient documentation

## 2019-05-01 DIAGNOSIS — Z885 Allergy status to narcotic agent status: Secondary | ICD-10-CM | POA: Diagnosis not present

## 2019-05-01 DIAGNOSIS — R627 Adult failure to thrive: Secondary | ICD-10-CM | POA: Diagnosis not present

## 2019-05-01 DIAGNOSIS — C8511 Unspecified B-cell lymphoma, lymph nodes of head, face, and neck: Secondary | ICD-10-CM | POA: Diagnosis not present

## 2019-05-01 DIAGNOSIS — Z8673 Personal history of transient ischemic attack (TIA), and cerebral infarction without residual deficits: Secondary | ICD-10-CM | POA: Diagnosis not present

## 2019-05-01 LAB — CBC WITH DIFFERENTIAL/PLATELET
Abs Immature Granulocytes: 0.04 10*3/uL (ref 0.00–0.07)
Basophils Absolute: 0.1 10*3/uL (ref 0.0–0.1)
Basophils Relative: 1 %
Eosinophils Absolute: 0.1 10*3/uL (ref 0.0–0.5)
Eosinophils Relative: 1 %
HCT: 44.2 % (ref 36.0–46.0)
Hemoglobin: 13.9 g/dL (ref 12.0–15.0)
Immature Granulocytes: 0 %
Lymphocytes Relative: 20 %
Lymphs Abs: 1.8 10*3/uL (ref 0.7–4.0)
MCH: 31.2 pg (ref 26.0–34.0)
MCHC: 31.4 g/dL (ref 30.0–36.0)
MCV: 99.1 fL (ref 80.0–100.0)
Monocytes Absolute: 0.8 10*3/uL (ref 0.1–1.0)
Monocytes Relative: 9 %
Neutro Abs: 6.3 10*3/uL (ref 1.7–7.7)
Neutrophils Relative %: 69 %
Platelets: 243 10*3/uL (ref 150–400)
RBC: 4.46 MIL/uL (ref 3.87–5.11)
RDW: 14.6 % (ref 11.5–15.5)
WBC: 9 10*3/uL (ref 4.0–10.5)
nRBC: 0 % (ref 0.0–0.2)

## 2019-05-01 LAB — BASIC METABOLIC PANEL
Anion gap: 11 (ref 5–15)
BUN: 19 mg/dL (ref 8–23)
CO2: 25 mmol/L (ref 22–32)
Calcium: 10.4 mg/dL — ABNORMAL HIGH (ref 8.9–10.3)
Chloride: 100 mmol/L (ref 98–111)
Creatinine, Ser: 1.11 mg/dL — ABNORMAL HIGH (ref 0.44–1.00)
GFR calc Af Amer: 54 mL/min — ABNORMAL LOW (ref >60)
GFR calc non Af Amer: 46 mL/min — ABNORMAL LOW (ref >60)
Glucose, Bld: 101 mg/dL — ABNORMAL HIGH (ref 70–99)
Potassium: 4.3 mmol/L (ref 3.5–5.1)
Sodium: 136 mmol/L (ref 135–145)

## 2019-05-01 LAB — PROTIME-INR
INR: 1 (ref 0.8–1.2)
Prothrombin Time: 13.1 seconds (ref 11.4–15.2)

## 2019-05-02 ENCOUNTER — Other Ambulatory Visit: Payer: Medicare PPO

## 2019-05-02 ENCOUNTER — Ambulatory Visit: Payer: Medicare PPO | Admitting: Nurse Practitioner

## 2019-05-02 ENCOUNTER — Inpatient Hospital Stay: Payer: Medicare PPO

## 2019-05-02 LAB — NOVEL CORONAVIRUS, NAA (HOSP ORDER, SEND-OUT TO REF LAB; TAT 18-24 HRS): SARS-CoV-2, NAA: NOT DETECTED

## 2019-05-02 NOTE — Progress Notes (Signed)
Anesthesia Chart Review   Case:  [id number] Date/Time:  05/04/19 1302   Procedure:  EXCISION DEEP LT AXILLARY LYMPH NODES (Left )   Anesthesia type:  General   Pre-op diagnosis:  LYMPHOMA   Location:  [place] [facility] / [facility]S   Surgeon:  Fanny Skates, MD      DISCUSSION:85 yo former smoker (12 pack years, quit 08/23/69) with h/o HTN, stave IV papillary thyroid carcinoma s/p thyroidectomy, A-fib, PE 2012, colon cancer s/p colectomy 2012, anemia, Non Hodgkin's lymphoma scheduled for above procedure 05/04/2019 with Dr. Fanny Skates.   Pt last seen by cardiologist, Dr. Daneen Schick, 01/30/19.  Per OV note, "Stable without recurrent atrial fibrillation.  No change in therapy.  Continue [place] to control rate if A. fib were to recur.  No anticoagulation therapy due to history of subarachnoid bleed."  Pt can proceed with planned procedure barring acute status change.  VS: BP 129/60   Pulse 92   Temp 36.8 C (Oral)   Resp 18   Ht 5\' 7"  (1.702 m)   Wt 93.5 kg   SpO2 98%   BMI 32.30 kg/m   PROVIDERS: Patient, No Pcp Per  Betsy Coder, MD is Oncologist  LABS: Labs reviewed: Acceptable for surgery. (all labs ordered are listed, but only abnormal results are displayed)  Labs Reviewed  BASIC METABOLIC PANEL - Abnormal; Notable for the following components:      Result Value   Glucose, Bld 101 (*)    Creatinine, Ser 1.11 (*)    Calcium 10.4 (*)    GFR calc non Af Amer 46 (*)    GFR calc Af Amer 54 (*)    All other components within normal limits  CBC WITH DIFFERENTIAL/PLATELET  PROTIME-INR     IMAGES:   EKG: 01/30/2019 Rate 83 bpm Normal sinus rhythm  Nonspecific T wave abnormality Prolonged QT  CV:  Past Medical History:  Diagnosis Date  . Anemia   . Arthritis   . Atrial fibrillation ([place])   . Cancer ([place])    Thyroid/Colon/ B cell Lymphoma-remission  . Diverticulosis   . History of atrial fibrillation    Was in only in a- fib for 2 days  . Hypertension   . Non  Hodgkin's lymphoma ([place])    Non-Hodgkin's B-Cell Lymphoma  . Papillary thyroid carcinoma ([place])   . Paroxysmal atrial fibrillation ([facility])   . Post-surgical hypothyroidism   . Pulmonary embolus ([place]) 11/2011  . Subarachnoid hemorrhage ([place]) 1996  . Thyroid disease     Past Surgical History:  Procedure Laterality Date  . APPENDECTOMY  1959  . CHOLECYSTECTOMY  1999   S/P Lap Cholecystectomy  . COLON RESECTION  11/12   mucinous adenocarcinoma of the cecum, resected  . COLONOSCOPY  10/05/2011  . COLONOSCOPY N/A 04/17/2014   Procedure: COLONOSCOPY;  Surgeon: Gatha Mayer, MD;  Location: [facility];  Service: Endoscopy;  Laterality: N/A;  . ESOPHAGOGASTRODUODENOSCOPY N/A 04/17/2014   Procedure: ESOPHAGOGASTRODUODENOSCOPY (EGD);  Surgeon: Gatha Mayer, MD;  Location: [facility];  Service: Endoscopy;  Laterality: N/A;  . KNEE ARTHROSCOPY Left 1998   S/P  Arthroscopic Left knee surgery  . ROTATOR CUFF REPAIR Right 1998  . SUBDURAL HEMATOMA EVACUATION VIA CRANIOTOMY    . THYROIDECTOMY  05/23/2012   Procedure: THYROIDECTOMY;  Surgeon: [name], MD;  Location: [place];  Service: General;  Laterality: N/A;  Total thyroidectomy, central compartment lymph node biopsy times two, reimplantation left superior parathyroid gland.  . TUBOPLASTY /  TUBOTUBAL ANASTOMOSIS      MEDICATIONS: . citalopram (CELEXA) 40 MG tablet  . diltiazem (DILACOR XR) 180 MG 24 hr capsule  . diphenhydrAMINE (BENADRYL) 25 MG tablet  . levothyroxine (SYNTHROID, LEVOTHROID) 112 MCG tablet  . losartan (COZAAR) 100 MG tablet  . ondansetron (ZOFRAN) 8 MG tablet  . prochlorperazine (COMPAZINE) 10 MG tablet  . traMADol (ULTRAM) 50 MG tablet   No current facility-administered medications for this encounter.     [facility] Pre-Surgical Testing [phone] 05/02/19 2:39 PM

## 2019-05-02 NOTE — Anesthesia Preprocedure Evaluation (Addendum)
Anesthesia Evaluation  Patient identified by MRN, date of birth, ID band Patient awake    Reviewed: Allergy & Precautions, NPO status , Patient's Chart, lab work & pertinent test results  History of Anesthesia Complications Negative for: history of anesthetic complications  Airway Mallampati: II  TM Distance: >3 FB Neck ROM: Full    Dental no notable dental hx.    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), Residual Symptoms negative psych ROS   GI/Hepatic negative GI ROS, Neg liver ROS,   Endo/Other  Hypothyroidism   Renal/GU negative Renal ROS  negative genitourinary   Musculoskeletal negative musculoskeletal ROS (+)   Abdominal   Peds  Hematology negative hematology ROS (+)   Anesthesia Other Findings   Reproductive/Obstetrics                           Anesthesia Physical Anesthesia Plan  ASA: III  Anesthesia Plan: General   Post-op Pain Management:    Induction: Intravenous  PONV Risk Score and Plan: 3 and Ondansetron, Dexamethasone and Treatment may vary due to age or medical condition  Airway Management Planned: LMA  Additional Equipment: None  Intra-op Plan:   Post-operative Plan: Extubation in OR  Informed Consent: I have reviewed the patients History and Physical, chart, labs and discussed the procedure including the risks, benefits and alternatives for the proposed anesthesia with the patient or authorized representative who has indicated his/her understanding and acceptance.     Dental advisory given  Plan Discussed with:   Anesthesia Plan Comments: (See PAT note 05/01/2019, Konrad Felix, PA-C)       Anesthesia Quick Evaluation

## 2019-05-04 ENCOUNTER — Ambulatory Visit (HOSPITAL_COMMUNITY)
Admission: RE | Admit: 2019-05-04 | Discharge: 2019-05-04 | Disposition: A | Discharge status: Home / Self Care | Payer: Medicare PPO | Attending: General Surgery | Admitting: General Surgery

## 2019-05-04 ENCOUNTER — Encounter (HOSPITAL_COMMUNITY)
Admission: RE | Disposition: A | Discharge status: Home / Self Care | Payer: Self-pay | Source: Home / Self Care | Attending: General Surgery

## 2019-05-04 ENCOUNTER — Other Ambulatory Visit: Payer: Self-pay

## 2019-05-04 ENCOUNTER — Ambulatory Visit (HOSPITAL_COMMUNITY): Payer: Medicare PPO | Admitting: Physician Assistant

## 2019-05-04 ENCOUNTER — Ambulatory Visit (HOSPITAL_COMMUNITY): Payer: Medicare PPO | Admitting: Certified Registered Nurse Anesthetist

## 2019-05-04 ENCOUNTER — Encounter (HOSPITAL_COMMUNITY): Payer: Self-pay

## 2019-05-04 DIAGNOSIS — C8511 Unspecified B-cell lymphoma, lymph nodes of head, face, and neck: Secondary | ICD-10-CM | POA: Insufficient documentation

## 2019-05-04 DIAGNOSIS — Z79890 Hormone replacement therapy (postmenopausal): Secondary | ICD-10-CM | POA: Insufficient documentation

## 2019-05-04 DIAGNOSIS — Z885 Allergy status to narcotic agent status: Secondary | ICD-10-CM | POA: Insufficient documentation

## 2019-05-04 DIAGNOSIS — C8514 Unspecified B-cell lymphoma, lymph nodes of axilla and upper limb: Secondary | ICD-10-CM

## 2019-05-04 DIAGNOSIS — Z886 Allergy status to analgesic agent status: Secondary | ICD-10-CM | POA: Insufficient documentation

## 2019-05-04 DIAGNOSIS — R627 Adult failure to thrive: Secondary | ICD-10-CM | POA: Diagnosis not present

## 2019-05-04 DIAGNOSIS — Z79899 Other long term (current) drug therapy: Secondary | ICD-10-CM | POA: Insufficient documentation

## 2019-05-04 DIAGNOSIS — Z86711 Personal history of pulmonary embolism: Secondary | ICD-10-CM | POA: Insufficient documentation

## 2019-05-04 DIAGNOSIS — Z8673 Personal history of transient ischemic attack (TIA), and cerebral infarction without residual deficits: Secondary | ICD-10-CM | POA: Insufficient documentation

## 2019-05-04 DIAGNOSIS — I10 Essential (primary) hypertension: Secondary | ICD-10-CM | POA: Insufficient documentation

## 2019-05-04 DIAGNOSIS — Z85850 Personal history of malignant neoplasm of thyroid: Secondary | ICD-10-CM | POA: Insufficient documentation

## 2019-05-04 DIAGNOSIS — E890 Postprocedural hypothyroidism: Secondary | ICD-10-CM | POA: Insufficient documentation

## 2019-05-04 DIAGNOSIS — Z85038 Personal history of other malignant neoplasm of large intestine: Secondary | ICD-10-CM | POA: Insufficient documentation

## 2019-05-04 DIAGNOSIS — C8590 Non-Hodgkin lymphoma, unspecified, unspecified site: Secondary | ICD-10-CM | POA: Diagnosis present

## 2019-05-04 DIAGNOSIS — Z1159 Encounter for screening for other viral diseases: Secondary | ICD-10-CM | POA: Insufficient documentation

## 2019-05-04 DIAGNOSIS — Z9889 Other specified postprocedural states: Secondary | ICD-10-CM | POA: Insufficient documentation

## 2019-05-04 DIAGNOSIS — Z87891 Personal history of nicotine dependence: Secondary | ICD-10-CM | POA: Insufficient documentation

## 2019-05-04 DIAGNOSIS — I480 Paroxysmal atrial fibrillation: Secondary | ICD-10-CM | POA: Insufficient documentation

## 2019-05-04 HISTORY — PX: LYMPH NODE DISSECTION: SHX5087

## 2019-05-04 SURGERY — LYMPH NODE DISSECTION
Anesthesia: General | Laterality: Left

## 2019-05-04 MED ORDER — LIDOCAINE 2% (20 MG/ML) 5 ML SYRINGE
INTRAMUSCULAR | Status: AC
  Filled 2019-05-04: qty 5

## 2019-05-04 MED ORDER — FENTANYL CITRATE (PF) 100 MCG/2ML IJ SOLN: 25.0000 ug | INTRAMUSCULAR | Status: DC | PRN

## 2019-05-04 MED ORDER — BUPIVACAINE-EPINEPHRINE 0.5% -1:200000 IJ SOLN
INTRAMUSCULAR | Status: AC
  Filled 2019-05-04: qty 1

## 2019-05-04 MED ORDER — CHLORHEXIDINE GLUCONATE CLOTH 2 % EX PADS: 6.0000 | MEDICATED_PAD | Freq: Once | CUTANEOUS | Status: DC

## 2019-05-04 MED ORDER — ACETAMINOPHEN 500 MG PO TABS
1000.0000 mg | ORAL_TABLET | ORAL | Status: AC
  Administered 2019-05-04: 1000 mg via ORAL
  Filled 2019-05-04: qty 2

## 2019-05-04 MED ORDER — ONDANSETRON HCL 4 MG/2ML IJ SOLN: 4.0000 mg | Freq: Once | INTRAMUSCULAR | Status: DC | PRN

## 2019-05-04 MED ORDER — PROPOFOL 10 MG/ML IV BOLUS
INTRAVENOUS | Status: AC
  Filled 2019-05-04: qty 20

## 2019-05-04 MED ORDER — DEXAMETHASONE SODIUM PHOSPHATE 10 MG/ML IJ SOLN
INTRAMUSCULAR | Status: DC | PRN
  Administered 2019-05-04: 6 mg via INTRAVENOUS

## 2019-05-04 MED ORDER — PROPOFOL 10 MG/ML IV BOLUS
INTRAVENOUS | Status: DC | PRN
  Administered 2019-05-04: 130 mg via INTRAVENOUS

## 2019-05-04 MED ORDER — ONDANSETRON HCL 4 MG/2ML IJ SOLN
INTRAMUSCULAR | Status: DC | PRN
  Administered 2019-05-04: 4 mg via INTRAVENOUS

## 2019-05-04 MED ORDER — OXYCODONE HCL 5 MG/5ML PO SOLN: 5.0000 mg | Freq: Once | ORAL | Status: DC | PRN

## 2019-05-04 MED ORDER — OXYCODONE HCL 5 MG PO TABS: 5.0000 mg | ORAL_TABLET | ORAL | Status: DC | PRN

## 2019-05-04 MED ORDER — BUPIVACAINE-EPINEPHRINE (PF) 0.5% -1:200000 IJ SOLN
INTRAMUSCULAR | Status: DC | PRN
  Administered 2019-05-04: 10 mL

## 2019-05-04 MED ORDER — ACETAMINOPHEN 325 MG PO TABS: 650.0000 mg | ORAL_TABLET | ORAL | Status: DC | PRN

## 2019-05-04 MED ORDER — SODIUM CHLORIDE 0.9 % IV SOLN: 250.0000 mL | INTRAVENOUS | Status: DC | PRN

## 2019-05-04 MED ORDER — HYDROCODONE-ACETAMINOPHEN 5-325 MG PO TABS: 1.0000 | ORAL_TABLET | Freq: Four times a day (QID) | ORAL | 0 refills | Status: DC | PRN

## 2019-05-04 MED ORDER — SODIUM CHLORIDE 0.9 % IR SOLN
Status: DC | PRN
  Administered 2019-05-04: 1000 mL

## 2019-05-04 MED ORDER — FENTANYL CITRATE (PF) 100 MCG/2ML IJ SOLN
INTRAMUSCULAR | Status: DC | PRN
  Administered 2019-05-04 (×2): 50 ug via INTRAVENOUS

## 2019-05-04 MED ORDER — ACETAMINOPHEN 650 MG RE SUPP
650.0000 mg | RECTAL | Status: DC | PRN
  Filled 2019-05-04: qty 1

## 2019-05-04 MED ORDER — EPHEDRINE 5 MG/ML INJ
INTRAVENOUS | Status: AC
  Filled 2019-05-04: qty 10

## 2019-05-04 MED ORDER — FENTANYL CITRATE (PF) 100 MCG/2ML IJ SOLN
INTRAMUSCULAR | Status: AC
  Filled 2019-05-04: qty 2

## 2019-05-04 MED ORDER — LIDOCAINE 2% (20 MG/ML) 5 ML SYRINGE
INTRAMUSCULAR | Status: DC | PRN
  Administered 2019-05-04: 80 mg via INTRAVENOUS

## 2019-05-04 MED ORDER — CEFAZOLIN SODIUM-DEXTROSE 2-4 GM/100ML-% IV SOLN
2.0000 g | INTRAVENOUS | Status: AC
  Administered 2019-05-04: 13:00:00 2 g via INTRAVENOUS
  Filled 2019-05-04: qty 100

## 2019-05-04 MED ORDER — OXYCODONE HCL 5 MG PO TABS: 5.0000 mg | ORAL_TABLET | Freq: Once | ORAL | Status: DC | PRN

## 2019-05-04 MED ORDER — LACTATED RINGERS IV SOLN: INTRAVENOUS | Status: DC

## 2019-05-04 MED ORDER — PHENYLEPHRINE 40 MCG/ML (10ML) SYRINGE FOR IV PUSH (FOR BLOOD PRESSURE SUPPORT)
PREFILLED_SYRINGE | INTRAVENOUS | Status: AC
  Filled 2019-05-04: qty 10

## 2019-05-04 MED ORDER — ONDANSETRON HCL 4 MG/2ML IJ SOLN
INTRAMUSCULAR | Status: AC
  Filled 2019-05-04: qty 2

## 2019-05-04 MED ORDER — DEXAMETHASONE SODIUM PHOSPHATE 10 MG/ML IJ SOLN
INTRAMUSCULAR | Status: AC
  Filled 2019-05-04: qty 1

## 2019-05-04 MED ORDER — EPHEDRINE SULFATE-NACL 50-0.9 MG/10ML-% IV SOSY
PREFILLED_SYRINGE | INTRAVENOUS | Status: DC | PRN
  Administered 2019-05-04: 5 mg via INTRAVENOUS
  Administered 2019-05-04: 10 mg via INTRAVENOUS

## 2019-05-04 MED ORDER — LACTATED RINGERS IV SOLN
INTRAVENOUS | Status: DC
  Administered 2019-05-04: 12:00:00 via INTRAVENOUS

## 2019-05-04 MED ORDER — PHENYLEPHRINE 40 MCG/ML (10ML) SYRINGE FOR IV PUSH (FOR BLOOD PRESSURE SUPPORT)
PREFILLED_SYRINGE | INTRAVENOUS | Status: DC | PRN
  Administered 2019-05-04: 40 ug via INTRAVENOUS
  Administered 2019-05-04: 120 ug via INTRAVENOUS

## 2019-05-04 MED ORDER — SODIUM CHLORIDE 0.9% FLUSH: 3.0000 mL | Freq: Two times a day (BID) | INTRAVENOUS | Status: DC

## 2019-05-04 MED ORDER — GABAPENTIN 300 MG PO CAPS
300.0000 mg | ORAL_CAPSULE | ORAL | Status: AC
  Administered 2019-05-04: 12:00:00 300 mg via ORAL
  Filled 2019-05-04: qty 1

## 2019-05-04 MED ORDER — SODIUM CHLORIDE 0.9% FLUSH: 3.0000 mL | INTRAVENOUS | Status: DC | PRN

## 2019-05-04 MED ORDER — SUCCINYLCHOLINE CHLORIDE 200 MG/10ML IV SOSY
PREFILLED_SYRINGE | INTRAVENOUS | Status: AC
  Filled 2019-05-04: qty 10

## 2019-05-04 SURGICAL SUPPLY — 38 items
APPLIER CLIP 11 MED OPEN (CLIP)
APPLIER CLIP 9.375 MED OPEN (MISCELLANEOUS) ×3
BENZOIN TINCTURE PRP APPL 2/3 (GAUZE/BANDAGES/DRESSINGS) ×3 IMPLANT
BLADE HEX COATED 2.75 (ELECTRODE) ×3 IMPLANT
BLADE SURG 15 STRL LF DISP TIS (BLADE) ×1 IMPLANT
BLADE SURG 15 STRL SS (BLADE) ×2
BLADE SURG SZ10 CARB STEEL (BLADE) ×3 IMPLANT
CLIP APPLIE 11 MED OPEN (CLIP) IMPLANT
CLIP APPLIE 9.375 MED OPEN (MISCELLANEOUS) IMPLANT
CLOSURE WOUND 1/2 X4 (GAUZE/BANDAGES/DRESSINGS) ×1
COVER WAND RF STERILE (DRAPES) IMPLANT
DECANTER SPIKE VIAL GLASS SM (MISCELLANEOUS) ×3 IMPLANT
DERMABOND ADVANCED (GAUZE/BANDAGES/DRESSINGS) ×2
DERMABOND ADVANCED .7 DNX12 (GAUZE/BANDAGES/DRESSINGS) IMPLANT
DRAPE LAPAROSCOPIC ABDOMINAL (DRAPES) ×3 IMPLANT
ELECT PENCIL ROCKER SW 15FT (MISCELLANEOUS) ×3 IMPLANT
ELECT REM PT RETURN 15FT ADLT (MISCELLANEOUS) ×3 IMPLANT
GAUZE 4X4 16PLY RFD (DISPOSABLE) IMPLANT
GAUZE SPONGE 4X4 12PLY STRL (GAUZE/BANDAGES/DRESSINGS) ×3 IMPLANT
GLOVE BIOGEL PI IND STRL 7.0 (GLOVE) ×1 IMPLANT
GLOVE BIOGEL PI INDICATOR 7.0 (GLOVE) ×2
GLOVE EUDERMIC 7 POWDERFREE (GLOVE) ×3 IMPLANT
GOWN STRL REUS W/TWL LRG LVL3 (GOWN DISPOSABLE) ×3 IMPLANT
GOWN STRL REUS W/TWL XL LVL3 (GOWN DISPOSABLE) ×6 IMPLANT
KIT BASIN OR (CUSTOM PROCEDURE TRAY) ×3 IMPLANT
KIT TURNOVER KIT A (KITS) IMPLANT
MARKER SKIN DUAL TIP RULER LAB (MISCELLANEOUS) ×3 IMPLANT
NDL HYPO 25X1 1.5 SAFETY (NEEDLE) ×1 IMPLANT
NEEDLE HYPO 25X1 1.5 SAFETY (NEEDLE) ×3 IMPLANT
NS IRRIG 1000ML POUR BTL (IV SOLUTION) ×3 IMPLANT
PACK BASIC VI WITH GOWN DISP (CUSTOM PROCEDURE TRAY) ×3 IMPLANT
SPONGE LAP 18X18 RF (DISPOSABLE) ×3 IMPLANT
STRIP CLOSURE SKIN 1/2X4 (GAUZE/BANDAGES/DRESSINGS) ×2 IMPLANT
SUT MNCRL AB 4-0 PS2 18 (SUTURE) ×3 IMPLANT
SUT VIC AB 3-0 SH 18 (SUTURE) ×2 IMPLANT
SYR CONTROL 10ML LL (SYRINGE) ×3 IMPLANT
TOWEL OR 17X26 10 PK STRL BLUE (TOWEL DISPOSABLE) ×3 IMPLANT
YANKAUER SUCT BULB TIP 10FT TU (MISCELLANEOUS) ×3 IMPLANT

## 2019-05-04 NOTE — Anesthesia Procedure Notes (Signed)
Procedure Name: LMA Insertion Date/Time: 05/04/2019 12:51 PM Performed by: Montel Clock, CRNA Pre-anesthesia Checklist: Patient identified, Emergency Drugs available, Suction available, Patient being monitored and Timeout performed Patient Re-evaluated:Patient Re-evaluated prior to induction Oxygen Delivery Method: Circle system utilized Preoxygenation: Pre-oxygenation with 100% oxygen Induction Type: IV induction LMA: LMA with gastric port inserted LMA Size: 4.0 Number of attempts: 1 Dental Injury: Teeth and Oropharynx as per pre-operative assessment

## 2019-05-04 NOTE — Interval H&P Note (Signed)
History and Physical Interval Note:  05/04/2019 11:54 AM  Tracy Mclaughlin  has presented today for surgery, with the diagnosis of LYMPHOMA.  The various methods of treatment have been discussed with the patient and family. After consideration of risks, benefits and other options for treatment, the patient has consented to  Procedure(s): EXCISION DEEP LEFT AXILLARY LYMPH NODES (Left) as a surgical intervention.  The patient's history has been reviewed, patient examined, no change in status, stable for surgery.  I have reviewed the patient's chart and labs.  Questions were answered to the patient's satisfaction.     [name]

## 2019-05-04 NOTE — Anesthesia Postprocedure Evaluation (Signed)
Anesthesia Post Note  Patient: Tracy Mclaughlin  Procedure(s) Performed: EXCISION DEEP LEFT AXILLARY LYMPH NODES (Left )     Patient location during evaluation: PACU Anesthesia Type: General Level of consciousness: awake and alert Pain management: pain level controlled Vital Signs Assessment: post-procedure vital signs reviewed and stable Respiratory status: spontaneous breathing, nonlabored ventilation and respiratory function stable Cardiovascular status: blood pressure returned to baseline and stable Postop Assessment: no apparent nausea or vomiting Anesthetic complications: no    Last Vitals:  Vitals:   05/04/19 1400 05/04/19 1415  BP: (!) 108/48 (!) 117/54  Pulse: 84 82  Resp: 15 15  Temp:  36.8 C  SpO2: 100% 100%    Last Pain:  Vitals:   05/04/19 1400  PainSc: 0-No pain                 Lidia Collum

## 2019-05-04 NOTE — Op Note (Signed)
Patient Name:           Tracy Mclaughlin   Date of Surgery:        05/04/2019  Pre op Diagnosis:      Lymphoma  Post op Diagnosis:    Lymphoma  Procedure:                 Excision deep left axillary lymph nodes  Surgeon:                     Edsel Petrin. Dalbert Batman, M.D., FACS  Assistant:                      OR staff  Operative Indications:  Tracy Mclaughlin is is an 85 year old female, referred by [name] for consideration of lymph node biopsy to clarify her lymphoma.    I performed her total thyroidectomy in June, 2012 for pathologic stage TIb, N1 B multifocal papillary thyroid carcinoma. She received postop radioiodine ablation.  She has no recurrence. She has had B-cell lymphoma for many years. She had right colon cancer resected in [place].     Recently she's had some fever, diarrhea and failure to thrive. She and her husband says she's been in decline for about 6 months. Increasing somnolence and memory. The diarrhea has resolved. C. difficile was negative. Chest x-ray was unremarkable. Recent COVID test was negative. She saw Dr. Daneen Schick on January 30, 2019 and she seemed fairly stable with no recurrence of her atrial fibrillation and good blood pressure control. Remote history of pulmonary embolism but no recurrence there. No change in therapy.. She's had upper endoscopy and colonoscopy in the last 3 years. She had a subdural hematoma and craniotomy following a skiing accident in 1990. She subsequently has subarachnoid hemorrhage that was treated nonoperatively. She is not on any anticoagulation.     In February ultrasound of the head and neck showed cervical adenopathy. In March PET scan showed extensive recurrent hypermetabolic lymphoma involving neck chest abdomen and pelvis. Core biopsy of left cervical neck node showed non-Hodgkin's B-cell lymphoma, consistent with marginal zone lymphoma. She's not been responding. Dr. Benay Spice wants an entire lymph node removed  to subclassify her lymphoma     Exam reveals  palpable adenopathy in the supraclavicular area and in the left axilla.     She will be scheduled for excision deep left axillary lymph node. This needs to be scheduled urgently due to her deterioration  Operative Findings:       In the deep left axillary space I found a 3 cm lymph node.  This was pathologically enlarged, discolored and consistent with lymphoma.  I do not feel that she needed more lymph nodes removed beyond this single lymph node  Procedure in Detail:          Following the induction of general LMA anesthesia the patient's left chest and axilla were prepped and draped in a sterile fashion.  Surgical timeout was performed.  Intravenous antibiotics were given.  0.5% Marcaine with epinephrine was used as a local infiltration anesthetic.  A transverse incision was made at the hairline in the left axilla.  Dissection was carried down through the subcutaneous tissue.  The clavipectoral fascia was incised.  We dissected into the axillary space and found the above described significantly enlarged lymph nodes.  This was slowly dissected away from the surrounding tissues.  Lymphatic and vascular channels were controlled with metal clips.  The lymph node was  removed and sent fresh to the pathology lab with the history attached.  The wound was irrigated.  Hemostasis was excellent.  The clavipectoral fascia was closed with 3-0 Vicryl sutures and the skin closed with a running subcuticular 4-0 Monocryl and Dermabond.  Patient tolerated the procedure well and was taken to PACU in stable condition.  EBL 10 cc.  Counts correct.  Complications none. I spoke to her husband by phone for a postop assessment.   Addendum: I logged onto the PMP aware website and reviewed her prescription medication history.     Edsel Petrin. Dalbert Batman, M.D., FACS General and Minimally Invasive Surgery Breast and Colorectal Surgery  05/04/2019 1:26 PM

## 2019-05-04 NOTE — Transfer of Care (Signed)
Immediate Anesthesia Transfer of Care Note  Patient: Tracy Mclaughlin  Procedure(s) Performed: EXCISION DEEP LEFT AXILLARY LYMPH NODES (Left )  Patient Location: PACU  Anesthesia Type:General  Level of Consciousness: drowsy and patient cooperative  Airway & Oxygen Therapy: Patient Spontanous Breathing and Patient connected to face mask oxygen  Post-op Assessment: Report given to RN and Post -op Vital signs reviewed and stable  Post vital signs: Reviewed and stable  Last Vitals:  Vitals Value Taken Time  BP    Temp    Pulse    Resp    SpO2      Last Pain:  Vitals:   05/04/19 1159  PainSc: 0-No pain         Complications: No apparent anesthesia complications

## 2019-05-04 NOTE — Discharge Instructions (Signed)
Place ice pack over wound for 10 minutes at a time, intermittently Do this 2 or 3 times an hour while awake for the next 36 hours  She may take a shower tomorrow but no tub baths  Tell her to move her shoulder around several times a day to avoid stiffness  Use Tylenol and Advil around-the-clock for pain control as instructed below  I will call in a narcotic prescription to your drugstore in case she needs it. It would be good to avoid narcotics since she has mild confusion  Return to see Dr. Dalbert Batman in the office in about 3 weeks          Managing Your Pain After Surgery Without Opioids    Thank you for participating in our program to help patients manage their pain after surgery without opioids. This is part of our effort to provide you with the best care possible, without exposing you or your family to the risk that opioids pose.  What pain can I expect after surgery? You can expect to have some pain after surgery. This is normal. The pain is typically worse the day after surgery, and quickly begins to get better. Many studies have found that many patients are able to manage their pain after surgery with Over-the-Counter (OTC) medications such as Tylenol and Motrin. If you have a condition that does not allow you to take Tylenol or Motrin, notify your surgical team.  How will I manage my pain? The best strategy for controlling your pain after surgery is around the clock pain control with Tylenol (acetaminophen) and Motrin (ibuprofen or Advil). Alternating these medications with each other allows you to maximize your pain control. In addition to Tylenol and Motrin, you can use heating pads or ice packs on your incisions to help reduce your pain.  How will I alternate your regular strength over-the-counter pain medication? You will take a dose of pain medication every three hours. ; Start by taking 650 mg of Tylenol (2 pills of 325 mg) ; 3 hours later take 600 mg of  Motrin (3 pills of 200 mg) ; 3 hours after taking the Motrin take 650 mg of Tylenol ; 3 hours after that take 600 mg of Motrin.   - 1 -  See example - if your first dose of Tylenol is at 12:00 PM   12:00 PM Tylenol 650 mg (2 pills of 325 mg)  3:00 PM Motrin 600 mg (3 pills of 200 mg)  6:00 PM Tylenol 650 mg (2 pills of 325 mg)  9:00 PM Motrin 600 mg (3 pills of 200 mg)  Continue alternating every 3 hours   We recommend that you follow this schedule around-the-clock for at least 3 days after surgery, or until you feel that it is no longer needed. Use the table on the last page of this handout to keep track of the medications you are taking. Important: Do not take more than 3000mg  of Tylenol or 3200mg  of Motrin in a 24-hour period. Do not take ibuprofen/Motrin if you have a history of bleeding stomach ulcers, severe kidney disease, &/or actively taking a blood thinner  What if I still have pain? If you have pain that is not controlled with the over-the-counter pain medications (Tylenol and Motrin or Advil) you might have what we call breakthrough pain. You will receive a prescription for a small amount of an opioid pain medication such as Oxycodone, Tramadol, or Tylenol with Codeine. Use these opioid pills in the first  24 hours after surgery if you have breakthrough pain. Do not take more than 1 pill every 4-6 hours.  If you still have uncontrolled pain after using all opioid pills, don't hesitate to call our staff using the number provided. We will help make sure you are managing your pain in the best way possible, and if necessary, we can provide a prescription for additional pain medication.   Day 1    Time  Name of Medication Number of pills taken  Amount of Acetaminophen  Pain Level   Comments  AM PM       AM PM       AM PM       AM PM       AM PM       AM PM       AM PM       AM PM       Total Daily amount of Acetaminophen Do not take more than  3,000 mg per day       Day 2    Time  Name of Medication Number of pills taken  Amount of Acetaminophen  Pain Level   Comments  AM PM       AM PM       AM PM       AM PM       AM PM       AM PM       AM PM       AM PM       Total Daily amount of Acetaminophen Do not take more than  3,000 mg per day      Day 3    Time  Name of Medication Number of pills taken  Amount of Acetaminophen  Pain Level   Comments  AM PM       AM PM       AM PM       AM PM          AM PM       AM PM       AM PM       AM PM       Total Daily amount of Acetaminophen Do not take more than  3,000 mg per day      Day 4    Time  Name of Medication Number of pills taken  Amount of Acetaminophen  Pain Level   Comments  AM PM       AM PM       AM PM       AM PM       AM PM       AM PM       AM PM       AM PM       Total Daily amount of Acetaminophen Do not take more than  3,000 mg per day      Day 5    Time  Name of Medication Number of pills taken  Amount of Acetaminophen  Pain Level   Comments  AM PM       AM PM       AM PM       AM PM       AM PM       AM PM       AM PM       AM PM       Total  Daily amount of Acetaminophen Do not take more than  3,000 mg per day       Day 6    Time  Name of Medication Number of pills taken  Amount of Acetaminophen  Pain Level  Comments  AM PM       AM PM       AM PM       AM PM       AM PM       AM PM       AM PM       AM PM       Total Daily amount of Acetaminophen Do not take more than  3,000 mg per day      Day 7    Time  Name of Medication Number of pills taken  Amount of Acetaminophen  Pain Level   Comments  AM PM       AM PM       AM PM       AM PM       AM PM       AM PM       AM PM       AM PM       Total Daily amount of Acetaminophen Do not take more than  3,000 mg per day        For additional information about how and where to safely dispose of unused opioid medications -  [employer]  Disclaimer: This document contains information and/or instructional materials adapted from [employer] for the typical patient with your condition. It does not replace medical advice from your health care provider because your experience may differ from that of the typical patient. Talk to your health care provider if you have any questions about this document, your condition or your treatment plan. Adapted from [employer]

## 2019-05-05 ENCOUNTER — Encounter (HOSPITAL_COMMUNITY): Payer: Self-pay | Admitting: General Surgery

## 2019-05-07 ENCOUNTER — Telehealth: Payer: Self-pay | Admitting: Nurse Practitioner

## 2019-05-07 NOTE — Telephone Encounter (Signed)
Called to inform patient of appt on 5/19. Spoke with husband. Confirmed date and time

## 2019-05-08 ENCOUNTER — Inpatient Hospital Stay: Payer: Medicare PPO | Admitting: Nurse Practitioner

## 2019-05-08 ENCOUNTER — Telehealth: Payer: Self-pay

## 2019-05-08 NOTE — Telephone Encounter (Signed)
TC to Ms. Scioli to confirm appointment for 05/10/19 at 11:45. Spoke with Pt.'s husband Paulina Muchmore who confirmed appointment for Thursday 05/10/19 at 1145.

## 2019-05-10 ENCOUNTER — Telehealth: Payer: Self-pay | Admitting: Nurse Practitioner

## 2019-05-10 ENCOUNTER — Inpatient Hospital Stay (HOSPITAL_BASED_OUTPATIENT_CLINIC_OR_DEPARTMENT_OTHER): Payer: Medicare PPO | Admitting: Nurse Practitioner

## 2019-05-10 ENCOUNTER — Other Ambulatory Visit: Payer: Self-pay

## 2019-05-10 ENCOUNTER — Encounter: Payer: Self-pay | Admitting: Nurse Practitioner

## 2019-05-10 ENCOUNTER — Other Ambulatory Visit: Payer: Self-pay | Admitting: General Surgery

## 2019-05-10 VITALS — BP 117/56 | HR 82 | Temp 98.0°F | Resp 18 | Ht 67.0 in

## 2019-05-10 DIAGNOSIS — C8267 Cutaneous follicle center lymphoma, spleen: Secondary | ICD-10-CM

## 2019-05-10 DIAGNOSIS — R630 Anorexia: Secondary | ICD-10-CM

## 2019-05-10 DIAGNOSIS — R05 Cough: Secondary | ICD-10-CM | POA: Diagnosis not present

## 2019-05-10 DIAGNOSIS — C8261 Cutaneous follicle center lymphoma, lymph nodes of head, face, and neck: Secondary | ICD-10-CM

## 2019-05-10 DIAGNOSIS — R0602 Shortness of breath: Secondary | ICD-10-CM | POA: Diagnosis not present

## 2019-05-10 DIAGNOSIS — C8518 Unspecified B-cell lymphoma, lymph nodes of multiple sites: Secondary | ICD-10-CM

## 2019-05-10 DIAGNOSIS — Z86711 Personal history of pulmonary embolism: Secondary | ICD-10-CM

## 2019-05-10 DIAGNOSIS — Z5112 Encounter for antineoplastic immunotherapy: Secondary | ICD-10-CM | POA: Diagnosis not present

## 2019-05-10 DIAGNOSIS — R531 Weakness: Secondary | ICD-10-CM

## 2019-05-10 MED ORDER — PREDNISONE 20 MG PO TABS: ORAL_TABLET | ORAL | 0 refills | Status: DC

## 2019-05-10 MED ORDER — ONDANSETRON HCL 8 MG PO TABS: 8.0000 mg | ORAL_TABLET | Freq: Three times a day (TID) | ORAL | 1 refills | Status: DC | PRN

## 2019-05-10 MED ORDER — ALLOPURINOL 100 MG PO TABS: 100.0000 mg | ORAL_TABLET | Freq: Every day | ORAL | 0 refills | Status: DC

## 2019-05-10 NOTE — Progress Notes (Signed)
DISCONTINUE ON PATHWAY REGIMEN - Lymphoma and CLL     A cycle is every 28 days:     Bendamustine      Rituximab   **Always confirm dose/schedule in your pharmacy ordering system**  REASON: Other Reason PRIOR TREATMENT: [id number]: Bendamustine + Rituximab IV (90/375) q28 Days x 4-6 Cycles TREATMENT RESPONSE: Unable to Evaluate  START OFF PATHWAY REGIMEN - Lymphoma and CLL   [id number]:R(IV/Subcut)-CHOP q21 Days:   A cycle is every 21 days:     Rituximab-xxxx      Rituximab and hyaluronidase human      Cyclophosphamide      Doxorubicin      Vincristine      Prednisone   **Always confirm dose/schedule in your pharmacy ordering system**  Patient Characteristics: Marginal Zone Lymphoma, Systemic, Third Line and Beyond Disease Type: Marginal Zone Lymphoma Disease Type: Not Applicable Disease Type: Not Applicable Localized or Systemic Disease<= Systemic [place] Stage: IV Line of Therapy: Third Line and Beyond Intent of Therapy: Non-Curative / Palliative Intent, Discussed with Patient

## 2019-05-10 NOTE — Patient Instructions (Signed)
Rituximab injection What is this medicine? RITUXIMAB (ri TUX i mab) is a monoclonal antibody. It is used to treat certain types of cancer like non-Hodgkin lymphoma and chronic lymphocytic leukemia. It is also used to treat rheumatoid arthritis, granulomatosis with polyangiitis (or Wegener's granulomatosis), microscopic polyangiitis, and pemphigus vulgaris. This medicine may be used for other purposes; ask your health care provider or pharmacist if you have questions. COMMON BRAND NAME(S): Rituxan What should I tell my health care provider before I take this medicine? They need to know if you have any of these conditions: -heart disease -infection (especially a virus infection such as hepatitis B, chickenpox, cold sores, or herpes) -immune system problems -irregular heartbeat -kidney disease -low blood counts, like low white cell, platelet, or red cell counts -lung or breathing disease, like asthma -recently received or scheduled to receive a vaccine -an unusual or allergic reaction to rituximab, other medicines, foods, dyes, or preservatives -pregnant or trying to get pregnant -breast-feeding How should I use this medicine? This medicine is for infusion into a vein. It is administered in a hospital or clinic by a specially trained health care professional. A special MedGuide will be given to you by the pharmacist with each prescription and refill. Be sure to read this information carefully each time. Talk to your pediatrician regarding the use of this medicine in children. This medicine is not approved for use in children. Overdosage: If you think you have taken too much of this medicine contact a poison control center or emergency room at once. NOTE: This medicine is only for you. Do not share this medicine with others. What if I miss a dose? It is important not to miss a dose. Call your doctor or health care professional if you are unable to keep an appointment. What may interact with  this medicine? -cisplatin -live virus vaccines This list may not describe all possible interactions. Give your health care provider a list of all the medicines, herbs, non-prescription drugs, or dietary supplements you use. Also tell them if you smoke, drink alcohol, or use illegal drugs. Some items may interact with your medicine. What should I watch for while using this medicine? Your condition will be monitored carefully while you are receiving this medicine. You may need blood work done while you are taking this medicine. This medicine can cause serious allergic reactions. To reduce your risk you may need to take medicine before treatment with this medicine. Take your medicine as directed. In some patients, this medicine may cause a serious brain infection that may cause death. If you have any problems seeing, thinking, speaking, walking, or standing, tell your healthcare professional right away. If you cannot reach your healthcare professional, urgently seek other source of medical care. Call your doctor or health care professional for advice if you get a fever, chills or sore throat, or other symptoms of a cold or flu. Do not treat yourself. This drug decreases your body's ability to fight infections. Try to avoid being around people who are sick. Do not become pregnant while taking this medicine or for 12 months after stopping it. Women should inform their doctor if they wish to become pregnant or think they might be pregnant. There is a potential for serious side effects to an unborn child. Talk to your health care professional or pharmacist for more information. Do not breast-feed an infant while taking this medicine or for 6 months after stopping it. What side effects may I notice from receiving this medicine? Side effects   that you should report to your doctor or health care professional as soon as possible: -allergic reactions like skin rash, itching or hives; swelling of the face, lips, or  tongue -breathing problems -chest pain -changes in vision -diarrhea -headache with fever, neck stiffness, sensitivity to light, nausea, or confusion -fast, irregular heartbeat -loss of memory -low blood counts - this medicine may decrease the number of white blood cells, red blood cells and platelets. You may be at increased risk for infections and bleeding. -mouth sores -problems with balance, talking, or walking -redness, blistering, peeling or loosening of the skin, including inside the mouth -signs of infection - fever or chills, cough, sore throat, pain or difficulty passing urine -signs and symptoms of kidney injury like trouble passing urine or change in the amount of urine -signs and symptoms of liver injury like dark yellow or brown urine; general ill feeling or flu-like symptoms; light-colored stools; loss of appetite; nausea; right upper belly pain; unusually weak or tired; yellowing of the eyes or skin -signs and symptoms of low blood pressure like dizziness; feeling faint or lightheaded, falls; unusually weak or tired -stomach pain -swelling of the ankles, feet, hands -unusual bleeding or bruising -vomiting Side effects that usually do not require medical attention (report to your doctor or health care professional if they continue or are bothersome): -headache -joint pain -muscle cramps or muscle pain -nausea -tiredness This list may not describe all possible side effects. Call your doctor for medical advice about side effects. You may report side effects to FDA at 1-800-FDA-1088. Where should I keep my medicine? This drug is given in a hospital or clinic and will not be stored at home. NOTE: This sheet is a summary. It may not cover all possible information. If you have questions about this medicine, talk to your doctor, pharmacist, or health care provider.  2019 Elsevier/Gold Standard (2017-11-18 13:04:32) Prednisone tablets What is this medicine? PREDNISONE (PRED ni  sone) is a corticosteroid. It is commonly used to treat inflammation of the skin, joints, lungs, and other organs. Common conditions treated include asthma, allergies, and arthritis. It is also used for other conditions, such as blood disorders and diseases of the adrenal glands. This medicine may be used for other purposes; ask your health care provider or pharmacist if you have questions. COMMON BRAND NAME(S): Deltasone, Predone, Sterapred, Sterapred DS What should I tell my health care provider before I take this medicine? They need to know if you have any of these conditions: -Cushing's syndrome -diabetes -glaucoma -heart disease -high blood pressure -infection (especially a virus infection such as chickenpox, cold sores, or herpes) -kidney disease -liver disease -mental illness -myasthenia gravis -osteoporosis -seizures -stomach or intestine problems -thyroid disease -an unusual or allergic reaction to lactose, prednisone, other medicines, foods, dyes, or preservatives -pregnant or trying to get pregnant -breast-feeding How should I use this medicine? Take this medicine by mouth with a glass of water. Follow the directions on the prescription label. Take this medicine with food. If you are taking this medicine once a day, take it in the morning. Do not take more medicine than you are told to take. Do not suddenly stop taking your medicine because you may develop a severe reaction. Your doctor will tell you how much medicine to take. If your doctor wants you to stop the medicine, the dose may be slowly lowered over time to avoid any side effects. Talk to your pediatrician regarding the use of this medicine in children. Special care may  be needed. Overdosage: If you think you have taken too much of this medicine contact a poison control center or emergency room at once. NOTE: This medicine is only for you. Do not share this medicine with others. What if I miss a dose? If you miss a  dose, take it as soon as you can. If it is almost time for your next dose, talk to your doctor or health care professional. You may need to miss a dose or take an extra dose. Do not take double or extra doses without advice. What may interact with this medicine? Do not take this medicine with any of the following medications: -metyrapone -mifepristone This medicine may also interact with the following medications: -aminoglutethimide -amphotericin B -aspirin and aspirin-like medicines -barbiturates -certain medicines for diabetes, like glipizide or glyburide -cholestyramine -cholinesterase inhibitors -cyclosporine -digoxin -diuretics -ephedrine -female hormones, like estrogens and birth control pills -isoniazid -ketoconazole -NSAIDS, medicines for pain and inflammation, like ibuprofen or naproxen -phenytoin -rifampin -toxoids -vaccines -warfarin This list may not describe all possible interactions. Give your health care provider a list of all the medicines, herbs, non-prescription drugs, or dietary supplements you use. Also tell them if you smoke, drink alcohol, or use illegal drugs. Some items may interact with your medicine. What should I watch for while using this medicine? Visit your doctor or health care professional for regular checks on your progress. If you are taking this medicine over a prolonged period, carry an identification card with your name and address, the type and dose of your medicine, and your doctor's name and address. This medicine may increase your risk of getting an infection. Tell your doctor or health care professional if you are around anyone with measles or chickenpox, or if you develop sores or blisters that do not heal properly. If you are going to have surgery, tell your doctor or health care professional that you have taken this medicine within the last twelve months. Ask your doctor or health care professional about your diet. You may need to lower the  amount of salt you eat. This medicine may increase blood sugar. Ask your healthcare provider if changes in diet or medicines are needed if you have diabetes. What side effects may I notice from receiving this medicine? Side effects that you should report to your doctor or health care professional as soon as possible: -allergic reactions like skin rash, itching or hives, swelling of the face, lips, or tongue -changes in emotions or moods -changes in vision -depressed mood -eye pain -fever or chills, cough, sore throat, pain or difficulty passing urine -signs and symptoms of high blood sugar such as being more thirsty or hungry or having to urinate more than normal. You may also feel very tired or have blurry vision. -swelling of ankles, feet Side effects that usually do not require medical attention (report to your doctor or health care professional if they continue or are bothersome): -confusion, excitement, restlessness -headache -nausea, vomiting -skin problems, acne, thin and shiny skin -trouble sleeping -weight gain This list may not describe all possible side effects. Call your doctor for medical advice about side effects. You may report side effects to FDA at 1-800-FDA-1088. Where should I keep my medicine? Keep out of the reach of children. Store at room temperature between 15 and 30 degrees C (59 and 86 degrees F). Protect from light. Keep container tightly closed. Throw away any unused medicine after the expiration date. NOTE: This sheet is a summary. It may not cover all  possible information. If you have questions about this medicine, talk to your doctor, pharmacist, or health care provider.  2019 Elsevier/Gold Standard (2018-09-05 10:54:22) Vincristine injection What is this medicine? VINCRISTINE (vin KRIS teen) is a chemotherapy drug. It slows the growth of cancer cells. This medicine is used to treat many types of cancer like Hodgkin's disease, leukemia, non-Hodgkin's  lymphoma, neuroblastoma (brain cancer), rhabdomyosarcoma, and Wilms' tumor. This medicine may be used for other purposes; ask your health care provider or pharmacist if you have questions. COMMON BRAND NAME(S): Oncovin, Vincasar PFS What should I tell my health care provider before I take this medicine? They need to know if you have any of these conditions: -blood disorders -gout -infection (especially chickenpox, cold sores, or herpes) -kidney disease -liver disease -lung disease -nervous system disease like Charcot-Marie-Tooth (CMT) -recent or ongoing radiation therapy -an unusual or allergic reaction to vincristine, other chemotherapy agents, other medicines, foods, dyes, or preservatives -pregnant or trying to get pregnant -breast-feeding How should I use this medicine? This drug is given as an infusion into a vein. It is administered in a hospital or clinic by a specially trained health care professional. If you have pain, swelling, burning, or any unusual feeling around the site of your injection, tell your health care professional right away. Talk to your pediatrician regarding the use of this medicine in children. While this drug may be prescribed for selected conditions, precautions do apply. Overdosage: If you think you have taken too much of this medicine contact a poison control center or emergency room at once. NOTE: This medicine is only for you. Do not share this medicine with others. What if I miss a dose? It is important not to miss your dose. Call your doctor or health care professional if you are unable to keep an appointment. What may interact with this medicine? Do not take this medicine with any of the following medications: -itraconazole -mibefradil -voriconazole This medicine may also interact with the following medications: -cyclosporine -erythromycin -fluconazole -ketoconazole -medicines for HIV like delavirdine, efavirenz, nevirapine -medicines for  seizures like ethotoin, fosphenotoin, phenytoin -medicines to increase blood counts like filgrastim, pegfilgrastim, sargramostim -other chemotherapy drugs like cisplatin, L-asparaginase, methotrexate, mitomycin, paclitaxel -pegaspargase -vaccines -zalcitabine, ddC Talk to your doctor or health care professional before taking any of these medicines: -acetaminophen -aspirin -ibuprofen -ketoprofen -naproxen This list may not describe all possible interactions. Give your health care provider a list of all the medicines, herbs, non-prescription drugs, or dietary supplements you use. Also tell them if you smoke, drink alcohol, or use illegal drugs. Some items may interact with your medicine. What should I watch for while using this medicine? Your condition will be monitored carefully while you are receiving this medicine. You will need important blood work done while you are taking this medicine. This drug may make you feel generally unwell. This is not uncommon, as chemotherapy can affect healthy cells as well as cancer cells. Report any side effects. Continue your course of treatment even though you feel ill unless your doctor tells you to stop. In some cases, you may be given additional medicines to help with side effects. Follow all directions for their use. Call your doctor or health care professional for advice if you get a fever, chills or sore throat, or other symptoms of a cold or flu. Do not treat yourself. Avoid taking products that contain aspirin, acetaminophen, ibuprofen, naproxen, or ketoprofen unless instructed by your doctor. These medicines may hide a fever. Do not become pregnant  while taking this medicine. Women should inform their doctor if they wish to become pregnant or think they might be pregnant. There is a potential for serious side effects to an unborn child. Talk to your health care professional or pharmacist for more information. Do not breast-feed an infant while taking  this medicine. Men may have a lower sperm count while taking this medicine. Talk to your doctor if you plan to father a child. What side effects may I notice from receiving this medicine? Side effects that you should report to your doctor or health care professional as soon as possible: -allergic reactions like skin rash, itching or hives, swelling of the face, lips, or tongue -breathing problems -confusion or changes in emotions or moods -constipation -cough -mouth sores -muscle weakness -nausea and vomiting -pain, swelling, redness or irritation at the injection site -pain, tingling, numbness in the hands or feet -problems with balance, talking, walking -seizures -stomach pain -trouble passing urine or change in the amount of urine Side effects that usually do not require medical attention (report to your doctor or health care professional if they continue or are bothersome): -diarrhea -hair loss -jaw pain -loss of appetite This list may not describe all possible side effects. Call your doctor for medical advice about side effects. You may report side effects to FDA at 1-800-FDA-1088. Where should I keep my medicine? This drug is given in a hospital or clinic and will not be stored at home. NOTE: This sheet is a summary. It may not cover all possible information. If you have questions about this medicine, talk to your doctor, pharmacist, or health care provider.  2019 Elsevier/Gold Standard (2008-09-02 17:17:13) Cyclophosphamide injection What is this medicine? CYCLOPHOSPHAMIDE (sye kloe FOSS fa mide) is a chemotherapy drug. It slows the growth of cancer cells. This medicine is used to treat many types of cancer like lymphoma, myeloma, leukemia, breast cancer, and ovarian cancer, to name a few. This medicine may be used for other purposes; ask your health care provider or pharmacist if you have questions. COMMON BRAND NAME(S): Cytoxan, Neosar What should I tell my health care  provider before I take this medicine? They need to know if you have any of these conditions: -blood disorders -history of other chemotherapy -infection -kidney disease -liver disease -recent or ongoing radiation therapy -tumors in the bone marrow -an unusual or allergic reaction to cyclophosphamide, other chemotherapy, other medicines, foods, dyes, or preservatives -pregnant or trying to get pregnant -breast-feeding How should I use this medicine? This drug is usually given as an injection into a vein or muscle or by infusion into a vein. It is administered in a hospital or clinic by a specially trained health care professional. Talk to your pediatrician regarding the use of this medicine in children. Special care may be needed. Overdosage: If you think you have taken too much of this medicine contact a poison control center or emergency room at once. NOTE: This medicine is only for you. Do not share this medicine with others. What if I miss a dose? It is important not to miss your dose. Call your doctor or health care professional if you are unable to keep an appointment. What may interact with this medicine? This medicine may interact with the following medications: -amiodarone -amphotericin B -azathioprine -certain antiviral medicines for HIV or AIDS such as protease inhibitors (e.g., indinavir, ritonavir) and zidovudine -certain blood pressure medications such as benazepril, captopril, enalapril, fosinopril, lisinopril, moexipril, monopril, perindopril, quinapril, ramipril, trandolapril -certain cancer medications  such as anthracyclines (e.g., daunorubicin, doxorubicin), busulfan, cytarabine, paclitaxel, pentostatin, tamoxifen, trastuzumab -certain diuretics such as chlorothiazide, chlorthalidone, hydrochlorothiazide, indapamide, metolazone -certain medicines that treat or prevent blood clots like warfarin -certain muscle relaxants such as  succinylcholine -cyclosporine -etanercept -indomethacin -medicines to increase blood counts like filgrastim, pegfilgrastim, sargramostim -medicines used as general anesthesia -metronidazole -natalizumab This list may not describe all possible interactions. Give your health care provider a list of all the medicines, herbs, non-prescription drugs, or dietary supplements you use. Also tell them if you smoke, drink alcohol, or use illegal drugs. Some items may interact with your medicine. What should I watch for while using this medicine? Visit your doctor for checks on your progress. This drug may make you feel generally unwell. This is not uncommon, as chemotherapy can affect healthy cells as well as cancer cells. Report any side effects. Continue your course of treatment even though you feel ill unless your doctor tells you to stop. Drink water or other fluids as directed. Urinate often, even at night. In some cases, you may be given additional medicines to help with side effects. Follow all directions for their use. Call your doctor or health care professional for advice if you get a fever, chills or sore throat, or other symptoms of a cold or flu. Do not treat yourself. This drug decreases your body's ability to fight infections. Try to avoid being around people who are sick. This medicine may increase your risk to bruise or bleed. Call your doctor or health care professional if you notice any unusual bleeding. Be careful brushing and flossing your teeth or using a toothpick because you may get an infection or bleed more easily. If you have any dental work done, tell your dentist you are receiving this medicine. You may get drowsy or dizzy. Do not drive, use machinery, or do anything that needs mental alertness until you know how this medicine affects you. Do not become pregnant while taking this medicine or for 1 year after stopping it. Women should inform their doctor if they wish to become  pregnant or think they might be pregnant. Men should not father a child while taking this medicine and for 4 months after stopping it. There is a potential for serious side effects to an unborn child. Talk to your health care professional or pharmacist for more information. Do not breast-feed an infant while taking this medicine. This medicine may interfere with the ability to have a child. This medicine has caused ovarian failure in some women. This medicine has caused reduced sperm counts in some men. You should talk with your doctor or health care professional if you are concerned about your fertility. If you are going to have surgery, tell your doctor or health care professional that you have taken this medicine. What side effects may I notice from receiving this medicine? Side effects that you should report to your doctor or health care professional as soon as possible: -allergic reactions like skin rash, itching or hives, swelling of the face, lips, or tongue -low blood counts - this medicine may decrease the number of white blood cells, red blood cells and platelets. You may be at increased risk for infections and bleeding. -signs of infection - fever or chills, cough, sore throat, pain or difficulty passing urine -signs of decreased platelets or bleeding - bruising, pinpoint red spots on the skin, black, tarry stools, blood in the urine -signs of decreased red blood cells - unusually weak or tired, fainting spells, lightheadedness -breathing  problems -dark urine -dizziness -palpitations -swelling of the ankles, feet, hands -trouble passing urine or change in the amount of urine -weight gain -yellowing of the eyes or skin Side effects that usually do not require medical attention (report to your doctor or health care professional if they continue or are bothersome): -changes in nail or skin color -hair loss -missed menstrual periods -mouth sores -nausea, vomiting This list may not  describe all possible side effects. Call your doctor for medical advice about side effects. You may report side effects to FDA at 1-800-FDA-1088. Where should I keep my medicine? This drug is given in a hospital or clinic and will not be stored at home. NOTE: This sheet is a summary. It may not cover all possible information. If you have questions about this medicine, talk to your doctor, pharmacist, or health care provider.  2019 Elsevier/Gold Standard (2012-10-20 16:22:58) Doxorubicin injection What is this medicine? DOXORUBICIN (dox oh ROO bi sin) is a chemotherapy drug. It is used to treat many kinds of cancer like leukemia, lymphoma, neuroblastoma, sarcoma, and Wilms' tumor. It is also used to treat bladder cancer, breast cancer, lung cancer, ovarian cancer, stomach cancer, and thyroid cancer. This medicine may be used for other purposes; ask your health care provider or pharmacist if you have questions. COMMON BRAND NAME(S): Adriamycin, Adriamycin PFS, Adriamycin RDF, Rubex What should I tell my health care provider before I take this medicine? They need to know if you have any of these conditions: -heart disease -history of low blood counts caused by a medicine -liver disease -recent or ongoing radiation therapy -an unusual or allergic reaction to doxorubicin, other chemotherapy agents, other medicines, foods, dyes, or preservatives -pregnant or trying to get pregnant -breast-feeding How should I use this medicine? This drug is given as an infusion into a vein. It is administered in a hospital or clinic by a specially trained health care professional. If you have pain, swelling, burning or any unusual feeling around the site of your injection, tell your health care professional right away. Talk to your pediatrician regarding the use of this medicine in children. Special care may be needed. Overdosage: If you think you have taken too much of this medicine contact a poison control center  or emergency room at once. NOTE: This medicine is only for you. Do not share this medicine with others. What if I miss a dose? It is important not to miss your dose. Call your doctor or health care professional if you are unable to keep an appointment. What may interact with this medicine? This medicine may interact with the following medications: -6-mercaptopurine -paclitaxel -phenytoin -St. John's Wort -trastuzumab -verapamil This list may not describe all possible interactions. Give your health care provider a list of all the medicines, herbs, non-prescription drugs, or dietary supplements you use. Also tell them if you smoke, drink alcohol, or use illegal drugs. Some items may interact with your medicine. What should I watch for while using this medicine? This drug may make you feel generally unwell. This is not uncommon, as chemotherapy can affect healthy cells as well as cancer cells. Report any side effects. Continue your course of treatment even though you feel ill unless your doctor tells you to stop. There is a maximum amount of this medicine you should receive throughout your life. The amount depends on the medical condition being treated and your overall health. Your doctor will watch how much of this medicine you receive in your lifetime. Tell your doctor if  you have taken this medicine before. You may need blood work done while you are taking this medicine. Your urine may turn red for a few days after your dose. This is not blood. If your urine is dark or brown, call your doctor. In some cases, you may be given additional medicines to help with side effects. Follow all directions for their use. Call your doctor or health care professional for advice if you get a fever, chills or sore throat, or other symptoms of a cold or flu. Do not treat yourself. This drug decreases your body's ability to fight infections. Try to avoid being around people who are sick. This medicine may increase  your risk to bruise or bleed. Call your doctor or health care professional if you notice any unusual bleeding. Talk to your doctor about your risk of cancer. You may be more at risk for certain types of cancers if you take this medicine. Do not become pregnant while taking this medicine or for 6 months after stopping it. Women should inform their doctor if they wish to become pregnant or think they might be pregnant. Men should not father a child while taking this medicine and for 6 months after stopping it. There is a potential for serious side effects to an unborn child. Talk to your health care professional or pharmacist for more information. Do not breast-feed an infant while taking this medicine. This medicine has caused ovarian failure in some women and reduced sperm counts in some men This medicine may interfere with the ability to have a child. Talk with your doctor or health care professional if you are concerned about your fertility. This medicine may cause a decrease in Co-Enzyme Q-10. You should make sure that you get enough Co-Enzyme Q-10 while you are taking this medicine. Discuss the foods you eat and the vitamins you take with your health care professional. What side effects may I notice from receiving this medicine? Side effects that you should report to your doctor or health care professional as soon as possible: -allergic reactions like skin rash, itching or hives, swelling of the face, lips, or tongue -breathing problems -chest pain -fast or irregular heartbeat -low blood counts - this medicine may decrease the number of white blood cells, red blood cells and platelets. You may be at increased risk for infections and bleeding. -pain, redness, or irritation at site where injected -signs of infection - fever or chills, cough, sore throat, pain or difficulty passing urine -signs of decreased platelets or bleeding - bruising, pinpoint red spots on the skin, black, tarry stools, blood in  the urine -swelling of the ankles, feet, hands -tiredness -weakness Side effects that usually do not require medical attention (report to your doctor or health care professional if they continue or are bothersome): -diarrhea -hair loss -mouth sores -nail discoloration or damage -nausea -red colored urine -vomiting This list may not describe all possible side effects. Call your doctor for medical advice about side effects. You may report side effects to FDA at 1-800-FDA-1088. Where should I keep my medicine? This drug is given in a hospital or clinic and will not be stored at home. NOTE: This sheet is a summary. It may not cover all possible information. If you have questions about this medicine, talk to your doctor, pharmacist, or health care provider.  2019 Elsevier/Gold Standard (2017-07-20 11:01:26)

## 2019-05-10 NOTE — Progress Notes (Signed)
Per Lattie Haw Left note on desk for nurse to call Mrs Gully tomorrow 05/11/19 to check on patient and to see how she's doing.

## 2019-05-10 NOTE — Progress Notes (Addendum)
[name]   Diagnosis: Non-Hodgkin's lymphoma  INTERVAL HISTORY:   Tracy Mclaughlin returns as scheduled.  She continues to feel weak.  She spends the majority of the day in bed.  When up, she uses a walker.  She denies pain.  No fever.  Occasional night sweats, none recent.  Appetite is poor.  She has not checked lymph nodes to see if they are larger.  Stable shortness of breath and cough.  She has weakness in the left leg that is chronic.  Objective:  Vital signs in last 24 hours:  Blood pressure (!) 117/56, pulse 82, temperature 98 F (36.7 C), temperature source Oral, resp. rate 18, height 5\' 7"  (1.702 m), SpO2 95 %.    Lymphatics: 1-1 and half centimeter low left neck/supraclavicular lymph nodes.  Healing incision left axilla with surrounding ecchymosis.  Small right low neck and right axillary lymph nodes. Vascular: No leg edema. Neuro: Decreased strength left leg (chronic).  Right leg strength intact.   Lab Results:  Lab Results  Component Value Date   WBC 9.0 05/01/2019   HGB 13.9 05/01/2019   HCT 44.2 05/01/2019   MCV 99.1 05/01/2019   PLT 243 05/01/2019   NEUTROABS 6.3 05/01/2019    Imaging:  No results found.  Medications: I have reviewed the patient's current medications.  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 Status post weekly Rituxan x4 03/01/2012 through 03/22/2012. She completed 4 "maintenance" doses of Rituxan, last on 12/19/2012. A restaging CT on 02/09/2013 showed no evidence of lymphoma.   Lymph node lateral to the thyroid bed on a neck ultrasound 02/21/2014, status post an FNA biopsy concerning for a lymphoproliferative disorder.  PET scan 09/28/2016 with active lymphoma within the neck, chest, abdomen, pelvis; splenic enlargement and hypermetabolism suspicious for splenic involvement.   Initiation of Rituxan weekly 4 09/29/2016  Initiation of maintenance Rituxan on a 3 month schedule 12/23/2016; final Rituxan 08/31/2018  Thyroid ultrasound 02/07/2019-left cervical lymphadenopathy  PET scan 03/08/2019-extensive recurrent hypermetabolic lymphoma involving the neck, chest, abdomen and pelvis.  03/16/2019 left cervical lymph node biopsy-features consistent with previously diagnosed non-Hodgkin's B-cell lymphoma, phenotypically consistent with marginal zone lymphoma. Flow cytometry with lambda restricted B-cell population without expression of CD5 or CD10 comprising 87% of all lymphocytes.  Cycle 1 bendamustine/Rituxan 03/22/2019  Excision deep left axillary lymph nodes 05/04/2019- flow cytometry with monoclonal B-cell population without expression of CD5 or CD10, comprises 96% of all lymphocytes. 2. Stage IV (T1bN1b M0) papillary thyroid cancer, status post a thyroidectomy with reimplantation of the left superior parathyroid gland on 05/23/2012, status post radioactive iodine therapy, followed by Dr. Buddy Duty.  3. Stage II (T3 N0) colon cancer, status post a right colectomy 10/19/2011, last colonoscopy April 2015-sigmoid adenoma removed.  4. History of a pulmonary embolism December 2012.  5. History of Atrial fibrillation 6. Iron deficiency anemia-new 03/18/2014. Hemoccult positive stool. The anemia corrected with iron. No longer taking iron.  Status post an upper endoscopy and colonoscopy by Dr. Carlean Purl April 2015 with no bleeding source identified, benign adenoma removed from the sigmoid colon. 7. Report of an upper gastric intestinal bleed fall 2016-managed in [place]. [name] 8. Left knee replacement May 2017, repeat left knee surgery May 2018 9. Pruritic rash 07/22/2016 10. Nausea and diarrhea 04/02/2019-stool negative for C. difficile toxin   Disposition: Tracy Mclaughlin appears unchanged.  She underwent excisional biopsy of a left axillary lymph node  05/04/2019.  The final pathology  report is pending.  Dr. Benay Spice has spoken with the pathologist.  Preliminary review indicates some characteristics of marginal zone lymphoma but a component of higher grade than previous.  The histology is not consistent with a large B-cell lymphoma.  Additional stains are pending.  The report will hopefully be available later today.  She is symptomatic from the lymphoma with fatigue/malaise, anorexia.  Dr. Benay Spice recommends treatment with CHOP/Rituxan.  We reviewed potential toxicities associated with the chemotherapy including bone marrow toxicity, nausea/vomiting, mouth sores, alopecia.  We discussed the vesicant properties of vincristine and Adriamycin.  She understands a Port-A-Cath is needed for this regimen.  We discussed the cardiotoxicity associated with Adriamycin.  She will need a 2D echo.  We discussed the potential for neuropathy with vincristine.  She agrees to proceed.  Prescriptions were sent to her pharmacy for prednisone, allopurinol and Zofran.  Referral made for a 2D echo.  Referral made to Dr. Dalbert Batman for Port-A-Cath as soon as possible.  We discussed Neulasta.  We reviewed potential toxicities including bone pain, rash, splenic rupture.  She will return for lab, follow-up and cycle 1 CHOP/Rituxan in 1 week.  She will contact the office in the interim with any problems.  Patient seen with Dr. Benay Spice.  40 minutes were spent face-to-face at today's visit with the majority of that time involved in counseling/coordination of care.    Ned Card ANP/GNP-BC   05/10/2019  12:16 PM This was a shared visit with Ned Card.  Tracy Mclaughlin was interviewed and examined.  She underwent a left axillary lymph node biopsy.  The final pathology is pending.  I discussed the pathology with Dr. Melina Copa.  She indicates there are features of persistent marginal zone lymphoma, but there is a higher mitotic rate than expected.  There is no evidence of large cell transformation.  Additional  immunohistochemical stains are pending.  Tracy Mclaughlin has anorexia and profound malaise.  Her status did not improve with 1 cycle of bendamustine/rituximab.  We discussed treatment options with Tracy Mclaughlin.  She declined hospital admission today.  We discussed CDP-rituximab, CHOP-rituximab, ibrutinib, and fludarabine/rituximab as possible treatment options.  I recommend CHOP-rituximab in an attempt to gain a quick response to therapy.  We will try to treat her within the next 1 week.  Julieanne Manson, MD

## 2019-05-10 NOTE — Telephone Encounter (Signed)
Scheduled appt per 5/21 los. Spoke with patient spouse and spouse aware of appt date and time.

## 2019-05-11 ENCOUNTER — Other Ambulatory Visit: Payer: Self-pay

## 2019-05-11 ENCOUNTER — Encounter (HOSPITAL_COMMUNITY): Payer: Self-pay | Admitting: *Deleted

## 2019-05-11 ENCOUNTER — Telehealth: Payer: Self-pay

## 2019-05-11 NOTE — Telephone Encounter (Signed)
Spoke to Mr Geerdes to see how Mrs. Dales was doing. Per spouse she is "no better and no worse". Advised Mr Simms that pt port placement is on the same day as the echocardiogram. Port placement is 5/26 @1 :00pm. Spouse verbalized understanding.

## 2019-05-14 ENCOUNTER — Other Ambulatory Visit: Payer: Self-pay | Admitting: Oncology

## 2019-05-14 NOTE — H&P (Signed)
Tracy Mclaughlin Location: [facility] Patient #: [id number] DOB: [birth year] Married / Language: English / Race: White Female      History of Present Illness  . This is an 85 year old female, referred by [name] and recently underwent axillary lymph node biopsy which showed lymphoma somewhat different than prior. Tracy .Mclaughlin is Tracy Mclaughlin cardiologist. Dr. Delrae Rend is Tracy Mclaughlin endocrinologist      I have known Tracy Mclaughlin for many years. I performed Tracy Mclaughlin total thyroidectomy in June, 2012 for pathologic stage TIb, N1 B multifocal papillary thyroid carcinoma. Postop parathyroid function was good. Dr. Buddy Duty  supervised Tracy Mclaughlin radioiodine ablation and follows Tracy Mclaughlin regularly with no known recurrence. Tracy Mclaughlin has had B-cell lymphoma for many years. Tracy Mclaughlin had right colon cancer resected in [place].      Recently Tracy Mclaughlin's had some fever diarrhea and failure to thrive. Tracy Mclaughlin and Tracy Mclaughlin husband says Tracy Mclaughlin's been in decline for about 6 months. Increasing somnolence and memory. The diarrhea has resolved. C. difficile was negative. Chest x-ray was unremarkable. Recent COVID test was negative.      Tracy Mclaughlin saw Dr. Daneen Schick on January 30, 2019 and Tracy Mclaughlin seemed fairly stable with no recurrence of Tracy Mclaughlin atrial fibrillation and good blood pressure control. Remote history of pulmonary embolism but no recurrence there. No change in therapy. Follow-up in 12 months. Tracy Mclaughlin's had upper endoscopy and colonoscopy in the last 3 years. Tracy Mclaughlin had a subdural hematoma and craniotomy following a skiing accident in 1990. Tracy Mclaughlin subsequently has subarachnoid hemorrhage that was treated nonoperatively. Tracy Mclaughlin is not on any anticoagulation.       In February ultrasound of the head and neck showed cervical adenopathy. In March PET scan showed extensive recurrent hypermetabolic lymphoma involving neck chest abdomen and pelvis. Core biopsy of left cervical neck node showed non-Hodgkin's B-cell lymphoma, consistent with marginal zone lymphoma.  Tracy Mclaughlin's not been responding. Dr. Benay Spice wanted an entire lymph node removed to subclassify Tracy Mclaughlin lymphoma, which we did. Dr. Benay Spice requests  port a cath insertion and want to start chemotherapy 05/17/2019.  When seen in the office, Tracy Mclaughlin was ambulating with a walker. Tracy Mclaughlin carried on a good conversation but memory was not as good. Tracy Mclaughlin very much wants to do something to try to help Tracy Mclaughlin situation and to feel better. So does Tracy Mclaughlin husband.  Tracy Mclaughlin will be scheduled for port a cath insertion with ultrasound  I told Tracy Mclaughlin that if Tracy Mclaughlin was extremely confused following emergence from anesthesia might have to admit Tracy Mclaughlin and Tracy Mclaughlin understands that. I discussed the indications, details, please, numerous risk of the surgery with the patient and Tracy Mclaughlin husband. . Tracy Mclaughlin understands all these issues. All of Tracy Mclaughlin questions are answered. Tracy Mclaughlin agrees with this plan.  This needs to be scheduled urgently due to Tracy Mclaughlin deterioration     Past Surgical History  Shoulder Surgery  Right.  Allergies  Demerol *ANALGESICS - OPIOID*   Medication History Allopurinol (100MG  Tablet, Oral) Active. Cefuroxime Axetil (500MG  Tablet, Oral) Active. dilTIAZem HCl ER Coated Beads (180MG  Capsule ER 24HR, Oral) Active. Losartan Potassium (100MG  Tablet, Oral) Active. Ondansetron HCl (8MG  Tablet, Oral) Active. Synthroid (112MCG Tablet, Oral) Active. Trintellix (5MG  Tablet, Oral) Active. Medications Reconciled  Social History  Alcohol use  Moderate alcohol use.  Family History  Alcohol Abuse  Father.  Other Problems Atrial Fibrillation  Colon Cancer  Diverticulosis     Review of Systems  General Present- Appetite Loss, Fatigue and Weight Loss. Not Present- Chills, Fever, Night Sweats and Weight Gain. Neurological Present-  Decreased Memory and Weakness. Not Present- Fainting, Headaches, Numbness, Seizures, Tingling, Tremor and Trouble walking.  Vitals  Weight: 203 lb Height: 67.5in Weight was reported by  patient. Body Surface Area: 2.05 m Body Mass Index: 31.32 kg/m  Temp.: 98.55F(Oral)  Pulse: 92 (Regular)  BP: 84/54 (Sitting, Left Arm, Standard)       Physical Exam  General Mental Status-Alert. General Appearance-Consistent with stated age. Hydration-Well hydrated. Voice-Normal. Note: Ambulated and a walker. This examined in a wheelchair. Alert but does not look very vigorous. Speech is normal but has some memory problems. Tracy Mclaughlin is perfectly aware of Tracy Mclaughlin situation and very competent to grant informed consent   Head and Neck Note: Thyroidectomy scar well-healed. No mass in the paratracheal area. Left supraclavicular adenopathy.   Eye Eyeball - Bilateral-Extraocular movements intact. Sclera/Conjunctiva - Bilateral-No scleral icterus.  Chest and Lung Exam Chest and lung exam reveals -quiet, even and easy respiratory effort with no use of accessory muscles and on auscultation, normal breath sounds, no adventitious sounds and normal vocal resonance. Inspection Chest Wall - Normal. Back - normal. Note: scar left axilla healing well.   Cardiovascular Cardiovascular examination reveals -normal heart sounds, regular rate and rhythm with no murmurs and normal pedal pulses bilaterally.  Abdomen Inspection Inspection of the abdomen reveals - No Hernias. Palpation/Percussion Palpation and Percussion of the abdomen reveal - Soft, Non Tender, No Rebound tenderness, No Rigidity (guarding) and No hepatosplenomegaly. Auscultation Auscultation of the abdomen reveals - Bowel sounds normal.  Neurologic Neurologic evaluation reveals -alert and oriented x 3 with no impairment of recent or remote memory. Mental Status-Normal.  Musculoskeletal Normal Exam - Left-Upper Extremity Strength Normal and Lower Extremity Strength Normal. Normal Exam - Right-Upper Extremity Strength Normal and Lower Extremity Strength Normal.  Lymphatic Head & Neck  General  Head & Neck Lymphatics: Bilateral - Description - Normal. Axillary  General Axillary Region: Bilateral - Description - Normal. Tenderness - Non Tender. Femoral & Inguinal  Generalized Femoral & Inguinal Lymphatics: Bilateral - Description - Normal. Tenderness - Non Tender.    Assessment & Plan  NON-HODGKIN'S LYMPHOMA IN ADULT (C85.90)  You have a non-Hodgkin's lymphoma Dr. Benay Spice requests port placement to facilitate chemotherapy  We will we'll schedule you for port insertion with ul;trasoubnd. This will be done as an outpatient surgery but we will need to be put to sleep temporarily. I discussed the indications, techniques, and numerous risk of the surgery with you and your husband  AXILLARY ADENOPATHY (R59.0) PAROXYSMAL ATRIAL FIBRILLATION (I48.0) HISTORY OF THYROID CANCER (Z85.850) HYPERTENSION, ESSENTIAL (I10) HISTORY OF SUBDURAL HEMATOMA (POST TRAUMATIC) (Z87.828) HISTORY OF PULMONARY EMBOLISM (Z86.711) HISTORY OF COLON CANCER IN ADULTHOOD (K56.256)    Delenn Ahn M. Dalbert Batman, M.D., [facility] General and Minimally invasive Surgery Breast and Colorectal Surgery Office:   [phone] Pager:   [phone]

## 2019-05-14 NOTE — Progress Notes (Signed)
   SCREENING SYMPTOMS OF COVID 19: completed with husband, Arlen   COUGH--denies  RUNNY NOSE--- denies  SORE THROAT---denies  NASAL CONGESTION---denies  SNEEZING----denies  SHORTNESS OF BREATH---denies  DIFFICULTY BREATHING---denies  TEMP >100.0 --denies  UNEXPLAINED BODY ACHES---dneies  CHILLS -----denies HEADACHES -----denies LOSS OF SMELL/ TASTE ------denies    HAVE YOU OR ANY FAMILY MEMBER TRAVELLED PAST 14 DAYS OUT OF THE   COUNTY---no STATE----lives in [place], otherwise No  COUNTRY----no  HAVE YOU OR ANY FAMILY MEMBER BEEN EXPOSED TO ANYONE WITH COVID 19? denies

## 2019-05-15 ENCOUNTER — Other Ambulatory Visit: Payer: Self-pay

## 2019-05-15 ENCOUNTER — Ambulatory Visit (HOSPITAL_COMMUNITY): Payer: Medicare PPO | Admitting: Certified Registered Nurse Anesthetist

## 2019-05-15 ENCOUNTER — Other Ambulatory Visit (HOSPITAL_COMMUNITY)
Admission: RE | Admit: 2019-05-15 | Discharge: 2019-05-15 | Disposition: A | Discharge status: Home / Self Care | Payer: Medicare PPO | Source: Ambulatory Visit | Attending: General Surgery | Admitting: General Surgery

## 2019-05-15 ENCOUNTER — Encounter (HOSPITAL_COMMUNITY)
Admission: RE | Disposition: A | Discharge status: Home / Self Care | Payer: Self-pay | Source: Home / Self Care | Attending: General Surgery

## 2019-05-15 ENCOUNTER — Ambulatory Visit (HOSPITAL_COMMUNITY): Admission: RE | Admit: 2019-05-15 | Payer: Medicare PPO | Source: Ambulatory Visit

## 2019-05-15 ENCOUNTER — Ambulatory Visit (HOSPITAL_COMMUNITY): Payer: Medicare PPO

## 2019-05-15 ENCOUNTER — Encounter (HOSPITAL_COMMUNITY): Payer: Self-pay | Admitting: *Deleted

## 2019-05-15 ENCOUNTER — Ambulatory Visit (HOSPITAL_COMMUNITY)
Admission: RE | Admit: 2019-05-15 | Discharge: 2019-05-15 | Disposition: A | Discharge status: Home / Self Care | Payer: Medicare PPO | Attending: General Surgery | Admitting: General Surgery

## 2019-05-15 DIAGNOSIS — E890 Postprocedural hypothyroidism: Secondary | ICD-10-CM | POA: Diagnosis not present

## 2019-05-15 DIAGNOSIS — Z79890 Hormone replacement therapy (postmenopausal): Secondary | ICD-10-CM | POA: Insufficient documentation

## 2019-05-15 DIAGNOSIS — Z85850 Personal history of malignant neoplasm of thyroid: Secondary | ICD-10-CM | POA: Diagnosis not present

## 2019-05-15 DIAGNOSIS — I10 Essential (primary) hypertension: Secondary | ICD-10-CM | POA: Insufficient documentation

## 2019-05-15 DIAGNOSIS — Z79899 Other long term (current) drug therapy: Secondary | ICD-10-CM | POA: Insufficient documentation

## 2019-05-15 DIAGNOSIS — I4891 Unspecified atrial fibrillation: Secondary | ICD-10-CM | POA: Diagnosis not present

## 2019-05-15 DIAGNOSIS — Z85038 Personal history of other malignant neoplasm of large intestine: Secondary | ICD-10-CM | POA: Insufficient documentation

## 2019-05-15 DIAGNOSIS — C8511 Unspecified B-cell lymphoma, lymph nodes of head, face, and neck: Secondary | ICD-10-CM | POA: Insufficient documentation

## 2019-05-15 DIAGNOSIS — C8590 Non-Hodgkin lymphoma, unspecified, unspecified site: Secondary | ICD-10-CM | POA: Diagnosis present

## 2019-05-15 DIAGNOSIS — Z87891 Personal history of nicotine dependence: Secondary | ICD-10-CM | POA: Diagnosis not present

## 2019-05-15 DIAGNOSIS — Z1159 Encounter for screening for other viral diseases: Secondary | ICD-10-CM | POA: Diagnosis not present

## 2019-05-15 DIAGNOSIS — Z452 Encounter for adjustment and management of vascular access device: Secondary | ICD-10-CM | POA: Diagnosis not present

## 2019-05-15 DIAGNOSIS — Z95828 Presence of other vascular implants and grafts: Secondary | ICD-10-CM

## 2019-05-15 DIAGNOSIS — M1990 Unspecified osteoarthritis, unspecified site: Secondary | ICD-10-CM | POA: Diagnosis not present

## 2019-05-15 DIAGNOSIS — Z86711 Personal history of pulmonary embolism: Secondary | ICD-10-CM | POA: Diagnosis not present

## 2019-05-15 DIAGNOSIS — D759 Disease of blood and blood-forming organs, unspecified: Secondary | ICD-10-CM | POA: Diagnosis not present

## 2019-05-15 HISTORY — PX: PORTACATH PLACEMENT: SHX2246

## 2019-05-15 LAB — SARS CORONAVIRUS 2 BY RT PCR (HOSPITAL ORDER, PERFORMED IN ~~LOC~~ HOSPITAL LAB): SARS Coronavirus 2: NEGATIVE

## 2019-05-15 SURGERY — INSERTION, TUNNELED CENTRAL VENOUS DEVICE, WITH PORT
Anesthesia: General

## 2019-05-15 MED ORDER — HEPARIN SOD (PORK) LOCK FLUSH 100 UNIT/ML IV SOLN
INTRAVENOUS | Status: AC
  Filled 2019-05-15: qty 5

## 2019-05-15 MED ORDER — EPHEDRINE SULFATE-NACL 50-0.9 MG/10ML-% IV SOSY
PREFILLED_SYRINGE | INTRAVENOUS | Status: DC | PRN
  Administered 2019-05-15 (×4): 10 mg via INTRAVENOUS

## 2019-05-15 MED ORDER — BUPIVACAINE-EPINEPHRINE (PF) 0.5% -1:200000 IJ SOLN
INTRAMUSCULAR | Status: DC | PRN
  Administered 2019-05-15: 6 mL

## 2019-05-15 MED ORDER — EPHEDRINE 5 MG/ML INJ
INTRAVENOUS | Status: AC
  Filled 2019-05-15: qty 10

## 2019-05-15 MED ORDER — OXYCODONE HCL 5 MG PO TABS: 5.0000 mg | ORAL_TABLET | ORAL | Status: DC | PRN

## 2019-05-15 MED ORDER — SODIUM CHLORIDE 0.9% FLUSH: 3.0000 mL | INTRAVENOUS | Status: DC | PRN

## 2019-05-15 MED ORDER — FENTANYL CITRATE (PF) 100 MCG/2ML IJ SOLN: 25.0000 ug | INTRAMUSCULAR | Status: DC | PRN

## 2019-05-15 MED ORDER — SODIUM CHLORIDE 0.9% FLUSH: 3.0000 mL | Freq: Two times a day (BID) | INTRAVENOUS | Status: DC

## 2019-05-15 MED ORDER — DEXAMETHASONE SODIUM PHOSPHATE 10 MG/ML IJ SOLN
INTRAMUSCULAR | Status: DC | PRN
  Administered 2019-05-15: 10 mg via INTRAVENOUS

## 2019-05-15 MED ORDER — CEFAZOLIN SODIUM-DEXTROSE 2-4 GM/100ML-% IV SOLN
2.0000 g | INTRAVENOUS | Status: AC
  Administered 2019-05-15: 12:00:00 2 g via INTRAVENOUS
  Filled 2019-05-15: qty 100

## 2019-05-15 MED ORDER — ACETAMINOPHEN 325 MG PO TABS: 650.0000 mg | ORAL_TABLET | ORAL | Status: DC | PRN

## 2019-05-15 MED ORDER — BUPIVACAINE-EPINEPHRINE (PF) 0.25% -1:200000 IJ SOLN
INTRAMUSCULAR | Status: AC
  Filled 2019-05-15: qty 30

## 2019-05-15 MED ORDER — SODIUM CHLORIDE 0.9 % IV SOLN
Freq: Once | INTRAVENOUS | Status: AC
  Administered 2019-05-15: 5 mL
  Filled 2019-05-15: qty 1.2

## 2019-05-15 MED ORDER — ACETAMINOPHEN 650 MG RE SUPP
650.0000 mg | RECTAL | Status: DC | PRN
  Filled 2019-05-15: qty 1

## 2019-05-15 MED ORDER — CHLORHEXIDINE GLUCONATE CLOTH 2 % EX PADS: 6.0000 | MEDICATED_PAD | Freq: Once | CUTANEOUS | Status: DC

## 2019-05-15 MED ORDER — LACTATED RINGERS IV SOLN
INTRAVENOUS | Status: DC
  Administered 2019-05-15: 12:00:00 via INTRAVENOUS

## 2019-05-15 MED ORDER — PROPOFOL 10 MG/ML IV BOLUS
INTRAVENOUS | Status: DC | PRN
  Administered 2019-05-15: 140 mg via INTRAVENOUS
  Administered 2019-05-15: 60 mg via INTRAVENOUS

## 2019-05-15 MED ORDER — ACETAMINOPHEN 500 MG PO TABS
1000.0000 mg | ORAL_TABLET | ORAL | Status: AC
  Administered 2019-05-15: 1000 mg via ORAL
  Filled 2019-05-15: qty 2

## 2019-05-15 MED ORDER — FENTANYL CITRATE (PF) 100 MCG/2ML IJ SOLN
INTRAMUSCULAR | Status: DC | PRN
  Administered 2019-05-15 (×2): 25 ug via INTRAVENOUS

## 2019-05-15 MED ORDER — GABAPENTIN 300 MG PO CAPS
300.0000 mg | ORAL_CAPSULE | ORAL | Status: AC
  Administered 2019-05-15: 300 mg via ORAL
  Filled 2019-05-15: qty 1

## 2019-05-15 MED ORDER — FENTANYL CITRATE (PF) 100 MCG/2ML IJ SOLN
INTRAMUSCULAR | Status: AC
  Filled 2019-05-15: qty 2

## 2019-05-15 MED ORDER — SODIUM CHLORIDE 0.9 % IR SOLN
Status: DC | PRN
  Administered 2019-05-15: 1000 mL

## 2019-05-15 MED ORDER — LIDOCAINE 2% (20 MG/ML) 5 ML SYRINGE
INTRAMUSCULAR | Status: DC | PRN
  Administered 2019-05-15: 100 mg via INTRAVENOUS

## 2019-05-15 MED ORDER — ONDANSETRON HCL 4 MG/2ML IJ SOLN
INTRAMUSCULAR | Status: DC | PRN
  Administered 2019-05-15: 4 mg via INTRAVENOUS

## 2019-05-15 MED ORDER — LACTATED RINGERS IV SOLN: INTRAVENOUS | Status: DC

## 2019-05-15 MED ORDER — SODIUM CHLORIDE 0.9 % IV SOLN: 250.0000 mL | INTRAVENOUS | Status: DC | PRN

## 2019-05-15 MED ORDER — HEPARIN SOD (PORK) LOCK FLUSH 100 UNIT/ML IV SOLN
INTRAVENOUS | Status: DC | PRN
  Administered 2019-05-15: 500 [IU]

## 2019-05-15 SURGICAL SUPPLY — 28 items
BAG DECANTER FOR FLEXI CONT (MISCELLANEOUS) ×2 IMPLANT
BLADE SURG 15 STRL LF DISP TIS (BLADE) ×1 IMPLANT
BLADE SURG 15 STRL SS (BLADE) ×1
CHLORAPREP W/TINT 26 (MISCELLANEOUS) ×2 IMPLANT
COVER SURGICAL LIGHT HANDLE (MISCELLANEOUS) ×2 IMPLANT
COVER WAND RF STERILE (DRAPES) IMPLANT
DECANTER SPIKE VIAL GLASS SM (MISCELLANEOUS) ×2 IMPLANT
DERMABOND ADVANCED (GAUZE/BANDAGES/DRESSINGS) ×1
DERMABOND ADVANCED .7 DNX12 (GAUZE/BANDAGES/DRESSINGS) IMPLANT
DRAPE C-ARM 42X120 X-RAY (DRAPES) ×2 IMPLANT
DRAPE LAPAROSCOPIC ABDOMINAL (DRAPES) ×2 IMPLANT
ELECT PENCIL ROCKER SW 15FT (MISCELLANEOUS) ×2 IMPLANT
ELECT REM PT RETURN 15FT ADLT (MISCELLANEOUS) ×2 IMPLANT
GAUZE 4X4 16PLY RFD (DISPOSABLE) ×2 IMPLANT
GLOVE EUDERMIC 7 POWDERFREE (GLOVE) ×2 IMPLANT
GOWN STRL REUS W/TWL XL LVL3 (GOWN DISPOSABLE) ×2 IMPLANT
KIT BASIN OR (CUSTOM PROCEDURE TRAY) ×2 IMPLANT
KIT PORT POWER 8FR ISP CVUE (Port) ×1 IMPLANT
KIT TURNOVER KIT A (KITS) IMPLANT
NEEDLE HYPO 22GX1.5 SAFETY (NEEDLE) ×2 IMPLANT
PACK BASIC VI WITH GOWN DISP (CUSTOM PROCEDURE TRAY) ×2 IMPLANT
SUT MNCRL AB 4-0 PS2 18 (SUTURE) ×2 IMPLANT
SUT PROLENE 2 0 SH DA (SUTURE) ×2 IMPLANT
SUT VIC AB 3-0 SH 18 (SUTURE) ×2 IMPLANT
SYR 10ML LL (SYRINGE) ×2 IMPLANT
SYR 20CC LL (SYRINGE) ×2 IMPLANT
TOWEL OR 17X26 10 PK STRL BLUE (TOWEL DISPOSABLE) ×2 IMPLANT
TOWEL OR NON WOVEN STRL DISP B (DISPOSABLE) ×2 IMPLANT

## 2019-05-15 NOTE — Op Note (Addendum)
Patient Name:           Tracy Mclaughlin   Date of Surgery:        05/15/2019  Pre op Diagnosis:      Non-Hodgkin's B-cell lymphoma  Post op Diagnosis:    Same  Procedure:                 Insertion of PowerPort [place] 8 French tunneled venous vascular access device                                       Use of fluoroscopy for guidance and positioning  Surgeon:                     Edsel Petrin. Dalbert Batman, M.D., FACS  Assistant:                      OR staff  Operative Indications:   This is an 85 year old female, referred by Dr. Julieanne Mclaughlin and recently underwent axillary lymph node biopsy which showed  non-Hodgkin's B-cell lymphoma.Tracy Mclaughlin is Her cardiologist. Dr. Delrae Mclaughlin is her endocrinologist      I have known her for many years. I performed her total thyroidectomy in June, 2012 for pathologic stage TIb, N1 B multifocal papillary thyroid carcinoma. Postop parathyroid function was good. Dr. Buddy Mclaughlin  supervised her radioiodine ablation and follows her regularly with no known recurrence. She has had low-grade  B-cell lymphoma for many years. She had right colon cancer resected in [place].      Recently she's had some fever, diarrhea and failure to thrive. She and her husband says she's been in decline for about 6 months. Increasing somnolence and memory. The diarrhea has resolved. C. difficile was negative. Chest x-ray was unremarkable. Recent COVID test was negative.      She saw Dr. Daneen Mclaughlin on January 30, 2019 and she seemed fairly stable with no recurrence of her atrial fibrillation and good blood pressure control. Remote history of pulmonary embolism but no recurrence there. No change in therapy.  She is not on any anticoagulation.       In February ultrasound of the head and neck showed cervical adenopathy. In March PET scan showed extensive recurrent hypermetabolic lymphoma involving neck chest abdomen and pelvis. Core biopsy of left cervical neck node  showed non-Hodgkin's B-cell lymphoma, consistent with marginal zone lymphoma. She's not been responding. Dr. Benay Mclaughlin wanted an entire lymph node removed to subclassify her lymphoma, which we did. Dr. Benay Mclaughlin requests  port a cath insertion and want to start chemotherapy 05/17/2019.    Examination of her left axillary wound today was unremarkable.  It is healing without signs of infection, hematoma, or seroma.  She is nontender. She will be scheduled for port a cath insertion with ultrasound   I discussed the indications, details, please, numerous risk of the surgery with the patient and her husband. . She understands all these issues. All of her questions are answered. She agrees with this plan. This needs to be scheduled urgently due to her deterioration  Operative Findings:       The port and catheter were inserted through the right subclavian vein without any difficulty.  I had excellent blood return and flushed easily.  The catheter tip was in the superior vena cava at the right atrial junction.  There was no deformity of  the catheter.  She had no arrhythmias at any time during the procedure.  Procedure in Detail:          Following the induction of general LMA anesthesia the patient's neck and chest were prepped and draped in a sterile fashion.  She had been previously positioned with a small roll behind her shoulders and her arms tucked at her sides.  Surgical timeout was performed.  Intravenous antibiotics were given.  0.5% Marcaine with epinephrine was used as local infiltration anesthetic.    A right subclavian venipuncture was performed.  Blood return on the first pass.  Guidewire threaded easily.  Fluoroscopy confirmed the wire to be in the superior vena cava.  A small incision was made at the wire insertion site.  Using the C arm and a marking pen I drew a template on the chest wall to guide length of the catheter and placement of the catheter tip.  A transverse incision was made in the  right parasternal area.  A subcutaneous pocket was created.  Using a tunneling device I passed the catheter from the port pocket site to the wire insertion site.  Using the template drawn on the chest wall I cut the catheter 22.5 cm in length.  The catheter was then secured to the port with the locking device and the port and catheter flushed with heparinized saline.  The port was sutured to the pectoralis fascia with 3 interrupted Prolene sutures.  The dilator and peel-away sheath assembly were inserted over the guidewire into the central venous circulation.  The wire and dilator were removed, the catheter threaded easily, and the peel-away sheath removed.  The catheter flushed easily and had excellent blood return.  Fluoroscopy confirmed good positioning of the catheter tip and no deformity of the catheter.  The port and catheter were then flushed with concentrated heparin.  There was no bleeding.  The subcutaneous tissue was closed with 3-0 Vicryl sutures and the skin closed with subcuticular 4-0 Monocryl and Dermabond.  The patient tolerated the procedure well and was taken to PACU in stable condition.  A chest x-ray is planned.  EBL 10 cc.  Counts correct.  Complications none.    Addendum: I logged onto the PMP aware website and reviewed her prescription medication history.     Edsel Petrin. Dalbert Batman, M.D., FACS General and Minimally Invasive Surgery Breast and Colorectal Surgery  05/15/2019 12:58 PM

## 2019-05-15 NOTE — Interval H&P Note (Signed)
History and Physical Interval Note:  05/15/2019 11:32 AM  Tracy Mclaughlin  has presented today for surgery, with the diagnosis of lymphoma.  The various methods of treatment have been discussed with the patient and family. After consideration of risks, benefits and other options for treatment, the patient has consented to  Procedure(s): INSERTION PORT-A-CATH WITH ULTRASOUND (N/A) as a surgical intervention.  The patient's history has been reviewed, patient examined, no change in status, stable for surgery.  I have reviewed the patient's chart and labs.  Questions were answered to the patient's satisfaction.     [name]

## 2019-05-15 NOTE — Discharge Instructions (Signed)
    PORT-A-CATH: POST OP INSTRUCTIONS  Always review your discharge instruction sheet given to you by the facility where your surgery was performed.   1. A prescription for pain medication may be given to you upon discharge. Take your pain medication as prescribed, if needed. If narcotic pain medicine is not needed, then you make take acetaminophen (Tylenol) or ibuprofen (Advil) as needed.  2. Take your usually prescribed medications unless otherwise directed. 3. If you need a refill on your pain medication, please contact our office. All narcotic pain medicine now requires a paper prescription.  Phoned in and fax refills are no longer allowed by law.  Prescriptions will not be filled after 5 pm or on weekends.  4. You should follow a light diet for the remainder of the day after your procedure. 5. Most patients will experience some mild swelling and/or bruising in the area of the incision. It may take several days to resolve. 6. It is common to experience some constipation if taking pain medication after surgery. Increasing fluid intake and taking a stool softener (such as Colace) will usually help or prevent this problem from occurring. A mild laxative (Milk of Magnesia or Miralax) should be taken according to package directions if there are no bowel movements after 48 hours.  7. Unless discharge instructions indicate otherwise, you may remove your bandages 48 hours after surgery, and you may shower at that time. You may have steri-strips (small white skin tapes) in place directly over the incision.  These strips should be left on the skin for 7-10 days.  If your surgeon used Dermabond (skin glue) on the incision, you may shower in 24 hours.  The glue will flake off over the next 2-3 weeks.  8. If your port is left accessed at the end of surgery (needle left in port), the dressing cannot get wet and should only by changed by a healthcare professional. When the port is no longer accessed (when the  needle has been removed), follow step 7.   9. ACTIVITIES:  Limit activity involving your arms for the next 72 hours. Do no strenuous exercise or activity for 1 week. You may drive when you are no longer taking prescription pain medication, you can comfortably wear a seatbelt, and you can maneuver your car. 10.You may need to see your doctor in the office for a follow-up appointment.  Please       check with your doctor.  11.When you receive a new Port-a-Cath, you will get a product guide and        ID card.  Please keep them in case you need them.  WHEN TO CALL YOUR DOCTOR ([phone]): 1. Fever over 101.0 2. Chills 3. Continued bleeding from incision 4. Increased redness and tenderness at the site 5. Shortness of breath, difficulty breathing   The clinic staff is available to answer your questions during regular business hours. Please don't hesitate to call and ask to speak to one of the nurses or medical assistants for clinical concerns. If you have a medical emergency, go to the nearest emergency room or call 911.  A surgeon from [facility] is always on call at the hospital.     For further information, please visit [email or web address]      

## 2019-05-15 NOTE — Transfer of Care (Signed)
Immediate Anesthesia Transfer of Care Note  Patient: Tracy Mclaughlin  Procedure(s) Performed: INSERTION PORT-A-CATH WITH ULTRASOUND (N/A )  Patient Location: PACU  Anesthesia Type:General  Level of Consciousness: awake, alert  and patient cooperative  Airway & Oxygen Therapy: Patient Spontanous Breathing and Patient connected to face mask oxygen  Post-op Assessment: Report given to RN and Post -op Vital signs reviewed and stable  Post vital signs: Reviewed and stable  Last Vitals:  Vitals Value Taken Time  BP 119/44 05/15/2019  1:07 PM  Temp    Pulse 80 05/15/2019  1:10 PM  Resp 15 05/15/2019  1:10 PM  SpO2 100 % 05/15/2019  1:10 PM  Vitals shown include unvalidated device data.  Last Pain:  Vitals:   05/15/19 1128  TempSrc: Oral      Patients Stated Pain Goal: Doroteo Glassman) (15/72/62 [id number])  Complications: No apparent anesthesia complications

## 2019-05-15 NOTE — Anesthesia Preprocedure Evaluation (Signed)
Anesthesia Evaluation  Patient identified by MRN, date of birth, ID band Patient awake    Reviewed: Allergy & Precautions, NPO status , Patient's Chart, lab work & pertinent test results  Airway Mallampati: II  TM Distance: >3 FB Neck ROM: Full    Dental no notable dental hx. (+) Dental Advisory Given, Teeth Intact   Pulmonary former smoker, PE   Pulmonary exam normal breath sounds clear to auscultation       Cardiovascular hypertension, Normal cardiovascular exam+ dysrhythmias Atrial Fibrillation  Rhythm:Regular Rate:Normal     Neuro/Psych CVA negative psych ROS   GI/Hepatic negative GI ROS, Neg liver ROS,   Endo/Other  Hypothyroidism   Renal/GU negative Renal ROS  negative genitourinary   Musculoskeletal  (+) Arthritis ,   Abdominal   Peds negative pediatric ROS (+)  Hematology  (+) Blood dyscrasia (non-hodgkins B cell lymphoma), ,   Anesthesia Other Findings   Reproductive/Obstetrics negative OB ROS                             Anesthesia Physical Anesthesia Plan  ASA: III  Anesthesia Plan: General   Post-op Pain Management:    Induction: Intravenous  PONV Risk Score and Plan: 3 and Treatment may vary due to age or medical condition, Dexamethasone and Ondansetron  Airway Management Planned: LMA  Additional Equipment:   Intra-op Plan:   Post-operative Plan: Extubation in OR  Informed Consent: I have reviewed the patients History and Physical, chart, labs and discussed the procedure including the risks, benefits and alternatives for the proposed anesthesia with the patient or authorized representative who has indicated his/her understanding and acceptance.     Dental advisory given  Plan Discussed with: CRNA  Anesthesia Plan Comments:         Anesthesia Quick Evaluation

## 2019-05-15 NOTE — Anesthesia Postprocedure Evaluation (Signed)
Anesthesia Post Note  Patient: Frances Ambrosino  Procedure(s) Performed: INSERTION PORT-A-CATH WITH ULTRASOUND (N/A )     Patient location during evaluation: PACU Anesthesia Type: General Level of consciousness: awake and alert Pain management: pain level controlled Vital Signs Assessment: post-procedure vital signs reviewed and stable Respiratory status: spontaneous breathing, nonlabored ventilation, respiratory function stable and patient connected to nasal cannula oxygen Cardiovascular status: blood pressure returned to baseline and stable Postop Assessment: no apparent nausea or vomiting Anesthetic complications: no    Last Vitals:  Vitals:   05/15/19 1315 05/15/19 1330  BP: (!) 101/38 (!) 102/40  Pulse: (!) 50 73  Resp: 17 15  Temp:    SpO2: 100% 100%    Last Pain:  Vitals:   05/15/19 1307  TempSrc:   PainSc: Asleep                 Redith Drach L Clarece Drzewiecki

## 2019-05-15 NOTE — Anesthesia Procedure Notes (Signed)
Procedure Name: LMA Insertion Date/Time: 05/15/2019 12:17 PM Performed by: West Pugh, CRNA Pre-anesthesia Checklist: Patient identified, Emergency Drugs available, Suction available, Patient being monitored and Timeout performed Patient Re-evaluated:Patient Re-evaluated prior to induction Oxygen Delivery Method: Circle system utilized Preoxygenation: Pre-oxygenation with 100% oxygen Induction Type: IV induction LMA: LMA with gastric port inserted LMA Size: 4.0 Tube type: Oral Number of attempts: 1 Placement Confirmation: positive ETCO2 and breath sounds checked- equal and bilateral Tube secured with: Tape Dental Injury: Teeth and Oropharynx as per pre-operative assessment

## 2019-05-16 ENCOUNTER — Encounter (HOSPITAL_COMMUNITY): Payer: Self-pay | Admitting: General Surgery

## 2019-05-16 ENCOUNTER — Other Ambulatory Visit (HOSPITAL_COMMUNITY)
Admission: RE | Admit: 2019-05-16 | Discharge: 2019-05-16 | Disposition: A | Discharge status: Home / Self Care | Payer: Medicare PPO | Source: Ambulatory Visit | Attending: Oncology | Admitting: Oncology

## 2019-05-16 DIAGNOSIS — C8518 Unspecified B-cell lymphoma, lymph nodes of multiple sites: Secondary | ICD-10-CM | POA: Insufficient documentation

## 2019-05-18 ENCOUNTER — Inpatient Hospital Stay: Payer: Medicare PPO

## 2019-05-18 ENCOUNTER — Other Ambulatory Visit: Payer: Self-pay

## 2019-05-18 ENCOUNTER — Encounter: Payer: Self-pay | Admitting: Nurse Practitioner

## 2019-05-18 ENCOUNTER — Other Ambulatory Visit: Payer: Self-pay | Admitting: Nurse Practitioner

## 2019-05-18 ENCOUNTER — Inpatient Hospital Stay (HOSPITAL_BASED_OUTPATIENT_CLINIC_OR_DEPARTMENT_OTHER): Payer: Medicare PPO | Admitting: Nurse Practitioner

## 2019-05-18 VITALS — BP 117/48 | HR 70 | Temp 98.2°F | Resp 18 | Ht 67.0 in

## 2019-05-18 VITALS — BP 99/48 | HR 64 | Temp 98.1°F | Resp 18

## 2019-05-18 DIAGNOSIS — I4891 Unspecified atrial fibrillation: Secondary | ICD-10-CM | POA: Diagnosis not present

## 2019-05-18 DIAGNOSIS — R0609 Other forms of dyspnea: Secondary | ICD-10-CM

## 2019-05-18 DIAGNOSIS — R634 Abnormal weight loss: Secondary | ICD-10-CM

## 2019-05-18 DIAGNOSIS — C8267 Cutaneous follicle center lymphoma, spleen: Secondary | ICD-10-CM | POA: Diagnosis not present

## 2019-05-18 DIAGNOSIS — Z86711 Personal history of pulmonary embolism: Secondary | ICD-10-CM

## 2019-05-18 DIAGNOSIS — C73 Malignant neoplasm of thyroid gland: Secondary | ICD-10-CM

## 2019-05-18 DIAGNOSIS — C8261 Cutaneous follicle center lymphoma, lymph nodes of head, face, and neck: Secondary | ICD-10-CM | POA: Diagnosis not present

## 2019-05-18 DIAGNOSIS — C8518 Unspecified B-cell lymphoma, lymph nodes of multiple sites: Secondary | ICD-10-CM

## 2019-05-18 DIAGNOSIS — R531 Weakness: Secondary | ICD-10-CM

## 2019-05-18 DIAGNOSIS — Z5112 Encounter for antineoplastic immunotherapy: Secondary | ICD-10-CM | POA: Diagnosis not present

## 2019-05-18 DIAGNOSIS — Z95828 Presence of other vascular implants and grafts: Secondary | ICD-10-CM | POA: Insufficient documentation

## 2019-05-18 DIAGNOSIS — R05 Cough: Secondary | ICD-10-CM

## 2019-05-18 LAB — CMP (CANCER CENTER ONLY)
ALT: 23 U/L (ref 0–44)
AST: 48 U/L — ABNORMAL HIGH (ref 15–41)
Albumin: 3.3 g/dL — ABNORMAL LOW (ref 3.5–5.0)
Alkaline Phosphatase: 105 U/L (ref 38–126)
Anion gap: 9 (ref 5–15)
BUN: 21 mg/dL (ref 8–23)
CO2: 28 mmol/L (ref 22–32)
Calcium: 9.5 mg/dL (ref 8.9–10.3)
Chloride: 104 mmol/L (ref 98–111)
Creatinine: 0.94 mg/dL (ref 0.44–1.00)
GFR, Est AFR Am: >60 mL/min (ref >60)
GFR, Estimated: 56 mL/min — ABNORMAL LOW (ref >60)
Glucose, Bld: 88 mg/dL (ref 70–99)
Potassium: 4.3 mmol/L (ref 3.5–5.1)
Sodium: 141 mmol/L (ref 135–145)
Total Bilirubin: 1 mg/dL (ref 0.3–1.2)
Total Protein: 6 g/dL — ABNORMAL LOW (ref 6.5–8.1)

## 2019-05-18 LAB — CBC WITH DIFFERENTIAL (CANCER CENTER ONLY)
Abs Immature Granulocytes: 0.03 10*3/uL (ref 0.00–0.07)
Basophils Absolute: 0.1 10*3/uL (ref 0.0–0.1)
Basophils Relative: 1 %
Eosinophils Absolute: 0.2 10*3/uL (ref 0.0–0.5)
Eosinophils Relative: 3 %
HCT: 38.5 % (ref 36.0–46.0)
Hemoglobin: 12.2 g/dL (ref 12.0–15.0)
Immature Granulocytes: 0 %
Lymphocytes Relative: 33 %
Lymphs Abs: 2.3 10*3/uL (ref 0.7–4.0)
MCH: 31.7 pg (ref 26.0–34.0)
MCHC: 31.7 g/dL (ref 30.0–36.0)
MCV: 100 fL (ref 80.0–100.0)
Monocytes Absolute: 0.5 10*3/uL (ref 0.1–1.0)
Monocytes Relative: 7 %
Neutro Abs: 3.8 10*3/uL (ref 1.7–7.7)
Neutrophils Relative %: 56 %
Platelet Count: 191 10*3/uL (ref 150–400)
RBC: 3.85 MIL/uL — ABNORMAL LOW (ref 3.87–5.11)
RDW: 15.9 % — ABNORMAL HIGH (ref 11.5–15.5)
WBC Count: 6.8 10*3/uL (ref 4.0–10.5)
nRBC: 0 % (ref 0.0–0.2)

## 2019-05-18 LAB — LACTATE DEHYDROGENASE: LDH: 338 U/L — ABNORMAL HIGH (ref 98–192)

## 2019-05-18 MED ORDER — PALONOSETRON HCL INJECTION 0.25 MG/5ML
0.2500 mg | Freq: Once | INTRAVENOUS | Status: AC
  Administered 2019-05-18: 0.25 mg via INTRAVENOUS

## 2019-05-18 MED ORDER — SODIUM CHLORIDE 0.9 % IV SOLN
Freq: Once | INTRAVENOUS | Status: AC
  Administered 2019-05-18: 13:00:00 via INTRAVENOUS
  Filled 2019-05-18: qty 250

## 2019-05-18 MED ORDER — FAMOTIDINE IN NACL 20-0.9 MG/50ML-% IV SOLN
20.0000 mg | Freq: Once | INTRAVENOUS | Status: AC
  Administered 2019-05-18: 13:00:00 20 mg via INTRAVENOUS

## 2019-05-18 MED ORDER — PEGFILGRASTIM 6 MG/0.6ML ~~LOC~~ PSKT
PREFILLED_SYRINGE | SUBCUTANEOUS | Status: AC
  Filled 2019-05-18: qty 0.6

## 2019-05-18 MED ORDER — SODIUM CHLORIDE 0.9 % IV SOLN
Freq: Once | INTRAVENOUS | Status: AC
  Administered 2019-05-18: 13:00:00 via INTRAVENOUS
  Filled 2019-05-18: qty 5

## 2019-05-18 MED ORDER — SODIUM CHLORIDE 0.9% FLUSH
10.0000 mL | INTRAVENOUS | Status: DC | PRN
  Filled 2019-05-18: qty 10

## 2019-05-18 MED ORDER — ACETAMINOPHEN 325 MG PO TABS
650.0000 mg | ORAL_TABLET | Freq: Once | ORAL | Status: AC
  Administered 2019-05-18: 650 mg via ORAL

## 2019-05-18 MED ORDER — ACETAMINOPHEN 325 MG PO TABS
ORAL_TABLET | ORAL | Status: AC
  Filled 2019-05-18: qty 2

## 2019-05-18 MED ORDER — DOXORUBICIN HCL CHEMO IV INJECTION 2 MG/ML
40.0000 mg/m2 | Freq: Once | INTRAVENOUS | Status: AC
  Administered 2019-05-18: 82 mg via INTRAVENOUS
  Filled 2019-05-18: qty 41

## 2019-05-18 MED ORDER — SODIUM CHLORIDE 0.9% FLUSH
10.0000 mL | Freq: Once | INTRAVENOUS | Status: AC
  Administered 2019-05-18: 10 mL
  Filled 2019-05-18: qty 10

## 2019-05-18 MED ORDER — DIPHENHYDRAMINE HCL 25 MG PO CAPS
ORAL_CAPSULE | ORAL | Status: AC
  Filled 2019-05-18: qty 2

## 2019-05-18 MED ORDER — PEGFILGRASTIM 6 MG/0.6ML ~~LOC~~ PSKT
6.0000 mg | PREFILLED_SYRINGE | Freq: Once | SUBCUTANEOUS | Status: AC
  Administered 2019-05-18: 19:00:00 6 mg via SUBCUTANEOUS

## 2019-05-18 MED ORDER — HEPARIN SOD (PORK) LOCK FLUSH 100 UNIT/ML IV SOLN
500.0000 [IU] | Freq: Once | INTRAVENOUS | Status: DC | PRN
  Filled 2019-05-18: qty 5

## 2019-05-18 MED ORDER — SODIUM CHLORIDE 0.9 % IV SOLN
375.0000 mg/m2 | Freq: Once | INTRAVENOUS | Status: AC
  Administered 2019-05-18: 800 mg via INTRAVENOUS
  Filled 2019-05-18: qty 50

## 2019-05-18 MED ORDER — FAMOTIDINE IN NACL 20-0.9 MG/50ML-% IV SOLN
INTRAVENOUS | Status: AC
  Filled 2019-05-18: qty 50

## 2019-05-18 MED ORDER — SODIUM CHLORIDE 0.9 % IV SOLN
600.0000 mg/m2 | Freq: Once | INTRAVENOUS | Status: AC
  Administered 2019-05-18: 15:00:00 1240 mg via INTRAVENOUS
  Filled 2019-05-18: qty 62

## 2019-05-18 MED ORDER — VINCRISTINE SULFATE CHEMO INJECTION 1 MG/ML
1.0000 mg | Freq: Once | INTRAVENOUS | Status: AC
  Administered 2019-05-18: 1 mg via INTRAVENOUS
  Filled 2019-05-18: qty 1

## 2019-05-18 MED ORDER — PALONOSETRON HCL INJECTION 0.25 MG/5ML
INTRAVENOUS | Status: AC
  Filled 2019-05-18: qty 5

## 2019-05-18 MED ORDER — DIPHENHYDRAMINE HCL 25 MG PO CAPS
50.0000 mg | ORAL_CAPSULE | Freq: Once | ORAL | Status: AC
  Administered 2019-05-18: 50 mg via ORAL

## 2019-05-18 NOTE — Progress Notes (Addendum)
[facility] OFFICE PROGRESS NOTE   Diagnosis: Non-Hodgkin's lymphoma  INTERVAL HISTORY:   Tracy Mclaughlin returns as scheduled.  She notes slight improvement in her energy level.  She was unable to stand for a weight today.  She denies pain.  She reports some improvement in appetite.  Unchanged dyspnea on exertion and cough.  No fevers or sweats.  No nausea or vomiting.  Bowels are moving.  Objective:  Vital signs in last 24 hours:  Blood pressure (!) 117/48, pulse 70, temperature 98.2 F (36.8 C), temperature source Oral, resp. rate 18, height 5\' 7"  (1.702 m), SpO2 97 %.    Lymphatics: 1 to 1-1/2 cm low left neck/supraclavicular lymph nodes.  Healed incision left axilla.  Small right low neck and right axillary lymph nodes. Resp: Lungs clear bilaterally. CV: Regular rate and rhythm.  Occasional premature beat Vascular: No leg edema. Neuro: Alert and oriented. Port-A-Cath without erythema.  Lab Results:  Lab Results  Component Value Date   WBC 6.8 05/18/2019   HGB 12.2 05/18/2019   HCT 38.5 05/18/2019   MCV 100.0 05/18/2019   PLT 191 05/18/2019   NEUTROABS 3.8 05/18/2019    Imaging:  No results found.  Medications: I have reviewed the patient's current medications.  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 Status post weekly Rituxan x4 03/01/2012 through 03/22/2012. She completed 4 "maintenance" doses of Rituxan, last on 12/19/2012. A restaging CT on 02/09/2013 showed no evidence of lymphoma.   Lymph node lateral to the thyroid bed on a neck ultrasound 02/21/2014, status post an FNA biopsy concerning for a lymphoproliferative disorder.  PET scan 09/28/2016 with active lymphoma within the neck, chest, abdomen, pelvis; splenic enlargement and hypermetabolism suspicious for splenic involvement.  Initiation of Rituxan weekly 4  09/29/2016  Initiation of maintenance Rituxan on a 3 month schedule 12/23/2016; final Rituxan 08/31/2018  Thyroid ultrasound 02/07/2019-left cervical lymphadenopathy  PET scan 03/08/2019-extensive recurrent hypermetabolic lymphoma involving the neck, chest, abdomen and pelvis.  03/16/2019 left cervical lymph node biopsy-features consistent with previously diagnosed non-Hodgkin's B-cell lymphoma, phenotypically consistent with marginal zone lymphoma. Flow cytometry with lambda restricted B-cell population without expression of CD5 or CD10 comprising 87% of all lymphocytes.  Cycle 1 bendamustine/Rituxan 03/22/2019  Excision deep left axillary lymph nodes 05/04/2019- non-Hodgkin's B-cell lymphoma with differential including a marginal zone lymphoma and atypical small lymphocytic lymphoma.  Flow cytometry with monoclonal B-cell population without expression of CD5 or CD10, comprises 96% of all lymphocytes.  Cycle 1 CHOP/Rituxan 05/18/2019 2. Stage IV (T1bN1b M0) papillary thyroid cancer, status post a thyroidectomy with reimplantation of the left superior parathyroid gland on 05/23/2012, status post radioactive iodine therapy, followed by Dr. Buddy Duty.  3. Stage II (T3 N0) colon cancer, status post a right colectomy 10/19/2011, last colonoscopy April 2015-sigmoid adenoma removed.  4. History of a pulmonary embolism December 2012.  5. History of Atrial fibrillation 6. Iron deficiency anemia-new 03/18/2014. Hemoccult positive stool. The anemia corrected with iron. No longer taking iron.  Status post an upper endoscopy and colonoscopy by Dr. Carlean Purl April 2015 with no bleeding source identified, benign adenoma removed from the sigmoid colon. 7. Report of an upper gastric intestinal bleed fall 2016-managed in [place]. [name] 8. Left knee replacement May 2017, repeat left knee surgery May 2018 9. Pruritic rash 07/22/2016 10. Nausea and diarrhea 04/02/2019-stool negative for C. difficile toxin   Disposition:  Tracy Mclaughlin appears unchanged.  Performance status appears  poor.  The plan is to proceed with cycle 1 CHOP/Rituxan today as scheduled.  We again reviewed potential toxicities.  She agrees to proceed.  She will receive Neulasta on pro.  She will continue allopurinol for the 10-day course.  She understands to begin prednisone tomorrow.  She will return for lab and follow-up in 1 week.  She will contact the office in the interim with any problems.  Patient seen with Dr. Benay Spice.  25 minutes were spent face-to-face at today's visit with the majority of that time involved in counseling/coordination of care.    Ned Card ANP/GNP-BC   05/18/2019  12:03 PM This was a shared visit with Ned Card.  Tracy Mclaughlin was interviewed and examined.  Her malaise and weight loss are likely related to lymphoma.  The pathology suggest a higher grade process, though there is not evidence for transformation to large B-cell lymphoma.  The plan is to initiate CHOP-rituximab therapy today.  We will see her next week.  We will consider other systemic options if there is not a quick clinical response to the CHOP.  Julieanne Manson, MD

## 2019-05-18 NOTE — Patient Instructions (Signed)
[place] [facility] Discharge Instructions for Patients Receiving Chemotherapy  Today you received the following chemotherapy agents: Adriamycin, vicristine, cytoxan, and rituxan.  To help prevent nausea and vomiting after your treatment, we encourage you to take your nausea medication as directed   If you develop nausea and vomiting that is not controlled by your nausea medication, call the clinic.   BELOW ARE SYMPTOMS THAT SHOULD BE REPORTED IMMEDIATELY:  *FEVER GREATER THAN 100.5 F  *CHILLS WITH OR WITHOUT FEVER  NAUSEA AND VOMITING THAT IS NOT CONTROLLED WITH YOUR NAUSEA MEDICATION  *UNUSUAL SHORTNESS OF BREATH  *UNUSUAL BRUISING OR BLEEDING  TENDERNESS IN MOUTH AND THROAT WITH OR WITHOUT PRESENCE OF ULCERS  *URINARY PROBLEMS  *BOWEL PROBLEMS  UNUSUAL RASH Items with * indicate a potential emergency and should be followed up as soon as possible.  Feel free to call the clinic should you have any questions or concerns. The clinic phone number is [phone].  Please show the [place] at check-in to the Emergency Department and triage nurse.  Doxorubicin injection What is this medicine? DOXORUBICIN (dox oh ROO bi sin) is a chemotherapy drug. It is used to treat many kinds of cancer like leukemia, lymphoma, neuroblastoma, sarcoma, and Wilms' tumor. It is also used to treat bladder cancer, breast cancer, lung cancer, ovarian cancer, stomach cancer, and thyroid cancer. This medicine may be used for other purposes; ask your health care provider or pharmacist if you have questions. COMMON BRAND NAME(S): Adriamycin, Adriamycin PFS, Adriamycin RDF, Rubex What should I tell my health care provider before I take this medicine? They need to know if you have any of these conditions: -heart disease -history of low blood counts caused by a medicine -liver disease -recent or ongoing radiation therapy -an unusual or allergic reaction to doxorubicin, other chemotherapy  agents, other medicines, foods, dyes, or preservatives -pregnant or trying to get pregnant -breast-feeding How should I use this medicine? This drug is given as an infusion into a vein. It is administered in a hospital or clinic by a specially trained health care professional. If you have pain, swelling, burning or any unusual feeling around the site of your injection, tell your health care professional right away. Talk to your pediatrician regarding the use of this medicine in children. Special care may be needed. Overdosage: If you think you have taken too much of this medicine contact a poison control center or emergency room at once. NOTE: This medicine is only for you. Do not share this medicine with others. What if I miss a dose? It is important not to miss your dose. Call your doctor or health care professional if you are unable to keep an appointment. What may interact with this medicine? This medicine may interact with the following medications: -6-mercaptopurine -paclitaxel -phenytoin -St. John's Wort -trastuzumab -verapamil This list may not describe all possible interactions. Give your health care provider a list of all the medicines, herbs, non-prescription drugs, or dietary supplements you use. Also tell them if you smoke, drink alcohol, or use illegal drugs. Some items may interact with your medicine. What should I watch for while using this medicine? This drug may make you feel generally unwell. This is not uncommon, as chemotherapy can affect healthy cells as well as cancer cells. Report any side effects. Continue your course of treatment even though you feel ill unless your doctor tells you to stop. There is a maximum amount of this medicine you should receive throughout your life. The amount  depends on the medical condition being treated and your overall health. Your doctor will watch how much of this medicine you receive in your lifetime. Tell your doctor if you have taken  this medicine before. You may need blood work done while you are taking this medicine. Your urine may turn red for a few days after your dose. This is not blood. If your urine is dark or brown, call your doctor. In some cases, you may be given additional medicines to help with side effects. Follow all directions for their use. Call your doctor or health care professional for advice if you get a fever, chills or sore throat, or other symptoms of a cold or flu. Do not treat yourself. This drug decreases your body's ability to fight infections. Try to avoid being around people who are sick. This medicine may increase your risk to bruise or bleed. Call your doctor or health care professional if you notice any unusual bleeding. Talk to your doctor about your risk of cancer. You may be more at risk for certain types of cancers if you take this medicine. Do not become pregnant while taking this medicine or for 6 months after stopping it. Women should inform their doctor if they wish to become pregnant or think they might be pregnant. Men should not father a child while taking this medicine and for 6 months after stopping it. There is a potential for serious side effects to an unborn child. Talk to your health care professional or pharmacist for more information. Do not breast-feed an infant while taking this medicine. This medicine has caused ovarian failure in some women and reduced sperm counts in some men This medicine may interfere with the ability to have a child. Talk with your doctor or health care professional if you are concerned about your fertility. This medicine may cause a decrease in Co-Enzyme Q-10. You should make sure that you get enough Co-Enzyme Q-10 while you are taking this medicine. Discuss the foods you eat and the vitamins you take with your health care professional. What side effects may I notice from receiving this medicine? Side effects that you should report to your doctor or health  care professional as soon as possible: -allergic reactions like skin rash, itching or hives, swelling of the face, lips, or tongue -breathing problems -chest pain -fast or irregular heartbeat -low blood counts - this medicine may decrease the number of white blood cells, red blood cells and platelets. You may be at increased risk for infections and bleeding. -pain, redness, or irritation at site where injected -signs of infection - fever or chills, cough, sore throat, pain or difficulty passing urine -signs of decreased platelets or bleeding - bruising, pinpoint red spots on the skin, black, tarry stools, blood in the urine -swelling of the ankles, feet, hands -tiredness -weakness Side effects that usually do not require medical attention (report to your doctor or health care professional if they continue or are bothersome): -diarrhea -hair loss -mouth sores -nail discoloration or damage -nausea -red colored urine -vomiting This list may not describe all possible side effects. Call your doctor for medical advice about side effects. You may report side effects to FDA at 1-800-FDA-1088. Where should I keep my medicine? This drug is given in a hospital or clinic and will not be stored at home. NOTE: This sheet is a summary. It may not cover all possible information. If you have questions about this medicine, talk to your doctor, pharmacist, or health care provider.  2019 Elsevier/Gold Standard (2017-07-20 11:01:26)  Vincristine injection What is this medicine? VINCRISTINE (vin KRIS teen) is a chemotherapy drug. It slows the growth of cancer cells. This medicine is used to treat many types of cancer like Hodgkin's disease, leukemia, non-Hodgkin's lymphoma, neuroblastoma (brain cancer), rhabdomyosarcoma, and Wilms' tumor. This medicine may be used for other purposes; ask your health care provider or pharmacist if you have questions. COMMON BRAND NAME(S): Oncovin, Vincasar PFS What should I  tell my health care provider before I take this medicine? They need to know if you have any of these conditions: -blood disorders -gout -infection (especially chickenpox, cold sores, or herpes) -kidney disease -liver disease -lung disease -nervous system disease like Charcot-Marie-Tooth (CMT) -recent or ongoing radiation therapy -an unusual or allergic reaction to vincristine, other chemotherapy agents, other medicines, foods, dyes, or preservatives -pregnant or trying to get pregnant -breast-feeding How should I use this medicine? This drug is given as an infusion into a vein. It is administered in a hospital or clinic by a specially trained health care professional. If you have pain, swelling, burning, or any unusual feeling around the site of your injection, tell your health care professional right away. Talk to your pediatrician regarding the use of this medicine in children. While this drug may be prescribed for selected conditions, precautions do apply. Overdosage: If you think you have taken too much of this medicine contact a poison control center or emergency room at once. NOTE: This medicine is only for you. Do not share this medicine with others. What if I miss a dose? It is important not to miss your dose. Call your doctor or health care professional if you are unable to keep an appointment. What may interact with this medicine? Do not take this medicine with any of the following medications: -itraconazole -mibefradil -voriconazole This medicine may also interact with the following medications: -cyclosporine -erythromycin -fluconazole -ketoconazole -medicines for HIV like delavirdine, efavirenz, nevirapine -medicines for seizures like ethotoin, fosphenotoin, phenytoin -medicines to increase blood counts like filgrastim, pegfilgrastim, sargramostim -other chemotherapy drugs like cisplatin, L-asparaginase, methotrexate, mitomycin,  paclitaxel -pegaspargase -vaccines -zalcitabine, ddC Talk to your doctor or health care professional before taking any of these medicines: -acetaminophen -aspirin -ibuprofen -ketoprofen -naproxen This list may not describe all possible interactions. Give your health care provider a list of all the medicines, herbs, non-prescription drugs, or dietary supplements you use. Also tell them if you smoke, drink alcohol, or use illegal drugs. Some items may interact with your medicine. What should I watch for while using this medicine? Your condition will be monitored carefully while you are receiving this medicine. You will need important blood work done while you are taking this medicine. This drug may make you feel generally unwell. This is not uncommon, as chemotherapy can affect healthy cells as well as cancer cells. Report any side effects. Continue your course of treatment even though you feel ill unless your doctor tells you to stop. In some cases, you may be given additional medicines to help with side effects. Follow all directions for their use. Call your doctor or health care professional for advice if you get a fever, chills or sore throat, or other symptoms of a cold or flu. Do not treat yourself. Avoid taking products that contain aspirin, acetaminophen, ibuprofen, naproxen, or ketoprofen unless instructed by your doctor. These medicines may hide a fever. Do not become pregnant while taking this medicine. Women should inform their doctor if they wish to become pregnant or think they  might be pregnant. There is a potential for serious side effects to an unborn child. Talk to your health care professional or pharmacist for more information. Do not breast-feed an infant while taking this medicine. Men may have a lower sperm count while taking this medicine. Talk to your doctor if you plan to father a child. What side effects may I notice from receiving this medicine? Side effects that you  should report to your doctor or health care professional as soon as possible: -allergic reactions like skin rash, itching or hives, swelling of the face, lips, or tongue -breathing problems -confusion or changes in emotions or moods -constipation -cough -mouth sores -muscle weakness -nausea and vomiting -pain, swelling, redness or irritation at the injection site -pain, tingling, numbness in the hands or feet -problems with balance, talking, walking -seizures -stomach pain -trouble passing urine or change in the amount of urine Side effects that usually do not require medical attention (report to your doctor or health care professional if they continue or are bothersome): -diarrhea -hair loss -jaw pain -loss of appetite This list may not describe all possible side effects. Call your doctor for medical advice about side effects. You may report side effects to FDA at 1-800-FDA-1088. Where should I keep my medicine? This drug is given in a hospital or clinic and will not be stored at home. NOTE: This sheet is a summary. It may not cover all possible information. If you have questions about this medicine, talk to your doctor, pharmacist, or health care provider.  2019 Elsevier/Gold Standard (2008-09-02 17:17:13)  Cyclophosphamide injection What is this medicine? CYCLOPHOSPHAMIDE (sye kloe FOSS fa mide) is a chemotherapy drug. It slows the growth of cancer cells. This medicine is used to treat many types of cancer like lymphoma, myeloma, leukemia, breast cancer, and ovarian cancer, to name a few. This medicine may be used for other purposes; ask your health care provider or pharmacist if you have questions. COMMON BRAND NAME(S): Cytoxan, Neosar What should I tell my health care provider before I take this medicine? They need to know if you have any of these conditions: -blood disorders -history of other chemotherapy -infection -kidney disease -liver disease -recent or ongoing  radiation therapy -tumors in the bone marrow -an unusual or allergic reaction to cyclophosphamide, other chemotherapy, other medicines, foods, dyes, or preservatives -pregnant or trying to get pregnant -breast-feeding How should I use this medicine? This drug is usually given as an injection into a vein or muscle or by infusion into a vein. It is administered in a hospital or clinic by a specially trained health care professional. Talk to your pediatrician regarding the use of this medicine in children. Special care may be needed. Overdosage: If you think you have taken too much of this medicine contact a poison control center or emergency room at once. NOTE: This medicine is only for you. Do not share this medicine with others. What if I miss a dose? It is important not to miss your dose. Call your doctor or health care professional if you are unable to keep an appointment. What may interact with this medicine? This medicine may interact with the following medications: -amiodarone -amphotericin B -azathioprine -certain antiviral medicines for HIV or AIDS such as protease inhibitors (e.g., indinavir, ritonavir) and zidovudine -certain blood pressure medications such as benazepril, captopril, enalapril, fosinopril, lisinopril, moexipril, monopril, perindopril, quinapril, ramipril, trandolapril -certain cancer medications such as anthracyclines (e.g., daunorubicin, doxorubicin), busulfan, cytarabine, paclitaxel, pentostatin, tamoxifen, trastuzumab -certain diuretics such as chlorothiazide,  chlorthalidone, hydrochlorothiazide, indapamide, metolazone -certain medicines that treat or prevent blood clots like warfarin -certain muscle relaxants such as succinylcholine -cyclosporine -etanercept -indomethacin -medicines to increase blood counts like filgrastim, pegfilgrastim, sargramostim -medicines used as general anesthesia -metronidazole -natalizumab This list may not describe all possible  interactions. Give your health care provider a list of all the medicines, herbs, non-prescription drugs, or dietary supplements you use. Also tell them if you smoke, drink alcohol, or use illegal drugs. Some items may interact with your medicine. What should I watch for while using this medicine? Visit your doctor for checks on your progress. This drug may make you feel generally unwell. This is not uncommon, as chemotherapy can affect healthy cells as well as cancer cells. Report any side effects. Continue your course of treatment even though you feel ill unless your doctor tells you to stop. Drink water or other fluids as directed. Urinate often, even at night. In some cases, you may be given additional medicines to help with side effects. Follow all directions for their use. Call your doctor or health care professional for advice if you get a fever, chills or sore throat, or other symptoms of a cold or flu. Do not treat yourself. This drug decreases your body's ability to fight infections. Try to avoid being around people who are sick. This medicine may increase your risk to bruise or bleed. Call your doctor or health care professional if you notice any unusual bleeding. Be careful brushing and flossing your teeth or using a toothpick because you may get an infection or bleed more easily. If you have any dental work done, tell your dentist you are receiving this medicine. You may get drowsy or dizzy. Do not drive, use machinery, or do anything that needs mental alertness until you know how this medicine affects you. Do not become pregnant while taking this medicine or for 1 year after stopping it. Women should inform their doctor if they wish to become pregnant or think they might be pregnant. Men should not father a child while taking this medicine and for 4 months after stopping it. There is a potential for serious side effects to an unborn child. Talk to your health care professional or pharmacist for  more information. Do not breast-feed an infant while taking this medicine. This medicine may interfere with the ability to have a child. This medicine has caused ovarian failure in some women. This medicine has caused reduced sperm counts in some men. You should talk with your doctor or health care professional if you are concerned about your fertility. If you are going to have surgery, tell your doctor or health care professional that you have taken this medicine. What side effects may I notice from receiving this medicine? Side effects that you should report to your doctor or health care professional as soon as possible: -allergic reactions like skin rash, itching or hives, swelling of the face, lips, or tongue -low blood counts - this medicine may decrease the number of white blood cells, red blood cells and platelets. You may be at increased risk for infections and bleeding. -signs of infection - fever or chills, cough, sore throat, pain or difficulty passing urine -signs of decreased platelets or bleeding - bruising, pinpoint red spots on the skin, black, tarry stools, blood in the urine -signs of decreased red blood cells - unusually weak or tired, fainting spells, lightheadedness -breathing problems -dark urine -dizziness -palpitations -swelling of the ankles, feet, hands -trouble passing urine or change in  the amount of urine -weight gain -yellowing of the eyes or skin Side effects that usually do not require medical attention (report to your doctor or health care professional if they continue or are bothersome): -changes in nail or skin color -hair loss -missed menstrual periods -mouth sores -nausea, vomiting This list may not describe all possible side effects. Call your doctor for medical advice about side effects. You may report side effects to FDA at 1-800-FDA-1088. Where should I keep my medicine? This drug is given in a hospital or clinic and will not be stored at  home. NOTE: This sheet is a summary. It may not cover all possible information. If you have questions about this medicine, talk to your doctor, pharmacist, or health care provider.  2019 Elsevier/Gold Standard (2012-10-20 16:22:58)  Rituximab injection What is this medicine? RITUXIMAB (ri TUX i mab) is a monoclonal antibody. It is used to treat certain types of cancer like non-Hodgkin lymphoma and chronic lymphocytic leukemia. It is also used to treat rheumatoid arthritis, granulomatosis with polyangiitis (or Wegener's granulomatosis), microscopic polyangiitis, and pemphigus vulgaris. This medicine may be used for other purposes; ask your health care provider or pharmacist if you have questions. COMMON BRAND NAME(S): Rituxan What should I tell my health care provider before I take this medicine? They need to know if you have any of these conditions: -heart disease -infection (especially a virus infection such as hepatitis B, chickenpox, cold sores, or herpes) -immune system problems -irregular heartbeat -kidney disease -low blood counts, like low white cell, platelet, or red cell counts -lung or breathing disease, like asthma -recently received or scheduled to receive a vaccine -an unusual or allergic reaction to rituximab, other medicines, foods, dyes, or preservatives -pregnant or trying to get pregnant -breast-feeding How should I use this medicine? This medicine is for infusion into a vein. It is administered in a hospital or clinic by a specially trained health care professional. A special MedGuide will be given to you by the pharmacist with each prescription and refill. Be sure to read this information carefully each time. Talk to your pediatrician regarding the use of this medicine in children. This medicine is not approved for use in children. Overdosage: If you think you have taken too much of this medicine contact a poison control center or emergency room at once. NOTE: This  medicine is only for you. Do not share this medicine with others. What if I miss a dose? It is important not to miss a dose. Call your doctor or health care professional if you are unable to keep an appointment. What may interact with this medicine? -cisplatin -live virus vaccines This list may not describe all possible interactions. Give your health care provider a list of all the medicines, herbs, non-prescription drugs, or dietary supplements you use. Also tell them if you smoke, drink alcohol, or use illegal drugs. Some items may interact with your medicine. What should I watch for while using this medicine? Your condition will be monitored carefully while you are receiving this medicine. You may need blood work done while you are taking this medicine. This medicine can cause serious allergic reactions. To reduce your risk you may need to take medicine before treatment with this medicine. Take your medicine as directed. In some patients, this medicine may cause a serious brain infection that may cause death. If you have any problems seeing, thinking, speaking, walking, or standing, tell your healthcare professional right away. If you cannot reach your healthcare professional, urgently seek  other source of medical care. Call your doctor or health care professional for advice if you get a fever, chills or sore throat, or other symptoms of a cold or flu. Do not treat yourself. This drug decreases your body's ability to fight infections. Try to avoid being around people who are sick. Do not become pregnant while taking this medicine or for 12 months after stopping it. Women should inform their doctor if they wish to become pregnant or think they might be pregnant. There is a potential for serious side effects to an unborn child. Talk to your health care professional or pharmacist for more information. Do not breast-feed an infant while taking this medicine or for 6 months after stopping it. What side  effects may I notice from receiving this medicine? Side effects that you should report to your doctor or health care professional as soon as possible: -allergic reactions like skin rash, itching or hives; swelling of the face, lips, or tongue -breathing problems -chest pain -changes in vision -diarrhea -headache with fever, neck stiffness, sensitivity to light, nausea, or confusion -fast, irregular heartbeat -loss of memory -low blood counts - this medicine may decrease the number of white blood cells, red blood cells and platelets. You may be at increased risk for infections and bleeding. -mouth sores -problems with balance, talking, or walking -redness, blistering, peeling or loosening of the skin, including inside the mouth -signs of infection - fever or chills, cough, sore throat, pain or difficulty passing urine -signs and symptoms of kidney injury like trouble passing urine or change in the amount of urine -signs and symptoms of liver injury like dark yellow or brown urine; general ill feeling or flu-like symptoms; light-colored stools; loss of appetite; nausea; right upper belly pain; unusually weak or tired; yellowing of the eyes or skin -signs and symptoms of low blood pressure like dizziness; feeling faint or lightheaded, falls; unusually weak or tired -stomach pain -swelling of the ankles, feet, hands -unusual bleeding or bruising -vomiting Side effects that usually do not require medical attention (report to your doctor or health care professional if they continue or are bothersome): -headache -joint pain -muscle cramps or muscle pain -nausea -tiredness This list may not describe all possible side effects. Call your doctor for medical advice about side effects. You may report side effects to FDA at 1-800-FDA-1088. Where should I keep my medicine? This drug is given in a hospital or clinic and will not be stored at home. NOTE: This sheet is a summary. It may not cover all  possible information. If you have questions about this medicine, talk to your doctor, pharmacist, or health care provider.  2019 Elsevier/Gold Standard (2017-11-18 13:04:32)

## 2019-05-18 NOTE — Progress Notes (Signed)
Spoke w/ Dr. Benay Spice via inbasket message. Add emend for highly emetogenic chemo regimen and famotidine for history of reactions with rituximab.   Patient no showed for her baseline ECHO. She is symptomatic from her lymphoma and needs treatment, so he will get her ECHO prior to cycle 2. Doxorubicin is dose reduced.   Demetrius Charity, PharmD, [facility] Phone: [phone] 05/18/2019

## 2019-05-18 NOTE — Progress Notes (Signed)
Decrease cytoxan to 600mg /m2 per Dr Benay Spice

## 2019-05-19 ENCOUNTER — Telehealth: Payer: Self-pay

## 2019-05-19 NOTE — Telephone Encounter (Signed)
Received a message from Security that Mr. Tracy Mclaughlin called and left a message with him about a udenyca injection that his wife Tracy Mclaughlin was suppose to get. TC to Pt. Spoke with [name] and upon looking into Pt's chart Pt. received a Neulasta on pro to the left arm, which was placed on 05/18/19 and was not suppose to be removed until after 1841 on Saturday 05/19/19. [name] stated that the On pro fell off sometime during the night. Informed Pt.'s husband to place on pro in a bag and bring it with him on Monday So Pt. Can receive injection on Monday. Message sent to scheduling to put in appointment for Monday at 9 am Pt.'s Husband verbalized understanding of appointment time. In basket message also sent to pharmacy.

## 2019-05-21 ENCOUNTER — Telehealth: Payer: Self-pay | Admitting: Nurse Practitioner

## 2019-05-21 ENCOUNTER — Inpatient Hospital Stay: Payer: Medicare PPO | Attending: Oncology

## 2019-05-21 ENCOUNTER — Other Ambulatory Visit: Payer: Self-pay

## 2019-05-21 ENCOUNTER — Other Ambulatory Visit: Payer: Self-pay | Admitting: Nurse Practitioner

## 2019-05-21 DIAGNOSIS — Z5111 Encounter for antineoplastic chemotherapy: Secondary | ICD-10-CM | POA: Diagnosis present

## 2019-05-21 DIAGNOSIS — D61818 Other pancytopenia: Secondary | ICD-10-CM | POA: Diagnosis not present

## 2019-05-21 DIAGNOSIS — Z5189 Encounter for other specified aftercare: Secondary | ICD-10-CM | POA: Insufficient documentation

## 2019-05-21 DIAGNOSIS — C8267 Cutaneous follicle center lymphoma, spleen: Secondary | ICD-10-CM | POA: Diagnosis present

## 2019-05-21 DIAGNOSIS — Z5112 Encounter for antineoplastic immunotherapy: Secondary | ICD-10-CM | POA: Diagnosis present

## 2019-05-21 DIAGNOSIS — Z86711 Personal history of pulmonary embolism: Secondary | ICD-10-CM | POA: Diagnosis not present

## 2019-05-21 DIAGNOSIS — I4891 Unspecified atrial fibrillation: Secondary | ICD-10-CM | POA: Insufficient documentation

## 2019-05-21 DIAGNOSIS — C8518 Unspecified B-cell lymphoma, lymph nodes of multiple sites: Secondary | ICD-10-CM

## 2019-05-21 DIAGNOSIS — C8261 Cutaneous follicle center lymphoma, lymph nodes of head, face, and neck: Secondary | ICD-10-CM | POA: Diagnosis not present

## 2019-05-21 MED ORDER — PEGFILGRASTIM INJECTION 6 MG/0.6ML ~~LOC~~
6.0000 mg | PREFILLED_SYRINGE | Freq: Once | SUBCUTANEOUS | Status: AC
  Administered 2019-05-21: 6 mg via SUBCUTANEOUS

## 2019-05-21 MED ORDER — PEGFILGRASTIM INJECTION 6 MG/0.6ML ~~LOC~~
PREFILLED_SYRINGE | SUBCUTANEOUS | Status: AC
  Filled 2019-05-21: qty 0.6

## 2019-05-21 NOTE — Telephone Encounter (Signed)
Scheduled appt per 5/29 los.  Called patient and was not able to leave a voice message.

## 2019-05-21 NOTE — Patient Instructions (Signed)
Pegfilgrastim injection  What is this medicine?  PEGFILGRASTIM (PEG fil gra stim) is a long-acting granulocyte colony-stimulating factor that stimulates the growth of neutrophils, a type of white blood cell important in the body's fight against infection. It is used to reduce the incidence of fever and infection in patients with certain types of cancer who are receiving chemotherapy that affects the bone marrow, and to increase survival after being exposed to high doses of radiation.  This medicine may be used for other purposes; ask your health care provider or pharmacist if you have questions.  COMMON BRAND NAME(S): Fulphila, Neulasta, UDENYCA  What should I tell my health care provider before I take this medicine?  They need to know if you have any of these conditions:  -kidney disease  -latex allergy  -ongoing radiation therapy  -sickle cell disease  -skin reactions to acrylic adhesives (On-Body Injector only)  -an unusual or allergic reaction to pegfilgrastim, filgrastim, other medicines, foods, dyes, or preservatives  -pregnant or trying to get pregnant  -breast-feeding  How should I use this medicine?  This medicine is for injection under the skin. If you get this medicine at home, you will be taught how to prepare and give the pre-filled syringe or how to use the On-body Injector. Refer to the patient Instructions for Use for detailed instructions. Use exactly as directed. Tell your healthcare provider immediately if you suspect that the On-body Injector may not have performed as intended or if you suspect the use of the On-body Injector resulted in a missed or partial dose.  It is important that you put your used needles and syringes in a special sharps container. Do not put them in a trash can. If you do not have a sharps container, call your pharmacist or healthcare provider to get one.  Talk to your pediatrician regarding the use of this medicine in children. While this drug may be prescribed for  selected conditions, precautions do apply.  Overdosage: If you think you have taken too much of this medicine contact a poison control center or emergency room at once.  NOTE: This medicine is only for you. Do not share this medicine with others.  What if I miss a dose?  It is important not to miss your dose. Call your doctor or health care professional if you miss your dose. If you miss a dose due to an On-body Injector failure or leakage, a new dose should be administered as soon as possible using a single prefilled syringe for manual use.  What may interact with this medicine?  Interactions have not been studied.  Give your health care provider a list of all the medicines, herbs, non-prescription drugs, or dietary supplements you use. Also tell them if you smoke, drink alcohol, or use illegal drugs. Some items may interact with your medicine.  This list may not describe all possible interactions. Give your health care provider a list of all the medicines, herbs, non-prescription drugs, or dietary supplements you use. Also tell them if you smoke, drink alcohol, or use illegal drugs. Some items may interact with your medicine.  What should I watch for while using this medicine?  You may need blood work done while you are taking this medicine.  If you are going to need a MRI, CT scan, or other procedure, tell your doctor that you are using this medicine (On-Body Injector only).  What side effects may I notice from receiving this medicine?  Side effects that you should report to   your doctor or health care professional as soon as possible:  -allergic reactions like skin rash, itching or hives, swelling of the face, lips, or tongue  -back pain  -dizziness  -fever  -pain, redness, or irritation at site where injected  -pinpoint red spots on the skin  -red or dark-brown urine  -shortness of breath or breathing problems  -stomach or side pain, or pain at the shoulder  -swelling  -tiredness  -trouble passing urine or  change in the amount of urine  Side effects that usually do not require medical attention (report to your doctor or health care professional if they continue or are bothersome):  -bone pain  -muscle pain  This list may not describe all possible side effects. Call your doctor for medical advice about side effects. You may report side effects to FDA at 1-800-FDA-1088.  Where should I keep my medicine?  Keep out of the reach of children.  If you are using this medicine at home, you will be instructed on how to store it. Throw away any unused medicine after the expiration date on the label.  NOTE: This sheet is a summary. It may not cover all possible information. If you have questions about this medicine, talk to your doctor, pharmacist, or health care provider.   2019 Elsevier/Gold Standard (2018-03-13 16:57:08)

## 2019-05-25 ENCOUNTER — Inpatient Hospital Stay: Payer: Medicare PPO

## 2019-05-25 ENCOUNTER — Other Ambulatory Visit: Payer: Self-pay

## 2019-05-25 ENCOUNTER — Encounter (HOSPITAL_COMMUNITY): Payer: Self-pay | Admitting: Oncology

## 2019-05-25 ENCOUNTER — Encounter: Payer: Self-pay | Admitting: Nurse Practitioner

## 2019-05-25 ENCOUNTER — Inpatient Hospital Stay (HOSPITAL_BASED_OUTPATIENT_CLINIC_OR_DEPARTMENT_OTHER): Payer: Medicare PPO | Admitting: Nurse Practitioner

## 2019-05-25 ENCOUNTER — Ambulatory Visit: Payer: Medicare PPO

## 2019-05-25 ENCOUNTER — Telehealth: Payer: Self-pay

## 2019-05-25 VITALS — BP 120/43 | HR 98 | Temp 98.1°F | Resp 18 | Ht 67.0 in | Wt 198.3 lb

## 2019-05-25 DIAGNOSIS — D61818 Other pancytopenia: Secondary | ICD-10-CM

## 2019-05-25 DIAGNOSIS — Z95828 Presence of other vascular implants and grafts: Secondary | ICD-10-CM

## 2019-05-25 DIAGNOSIS — B370 Candidal stomatitis: Secondary | ICD-10-CM

## 2019-05-25 DIAGNOSIS — C8518 Unspecified B-cell lymphoma, lymph nodes of multiple sites: Secondary | ICD-10-CM

## 2019-05-25 DIAGNOSIS — Z5112 Encounter for antineoplastic immunotherapy: Secondary | ICD-10-CM | POA: Diagnosis not present

## 2019-05-25 DIAGNOSIS — C8261 Cutaneous follicle center lymphoma, lymph nodes of head, face, and neck: Secondary | ICD-10-CM | POA: Diagnosis not present

## 2019-05-25 DIAGNOSIS — Z86711 Personal history of pulmonary embolism: Secondary | ICD-10-CM

## 2019-05-25 DIAGNOSIS — I4891 Unspecified atrial fibrillation: Secondary | ICD-10-CM | POA: Diagnosis not present

## 2019-05-25 DIAGNOSIS — C8267 Cutaneous follicle center lymphoma, spleen: Secondary | ICD-10-CM | POA: Diagnosis not present

## 2019-05-25 DIAGNOSIS — C73 Malignant neoplasm of thyroid gland: Secondary | ICD-10-CM

## 2019-05-25 LAB — CBC WITH DIFFERENTIAL (CANCER CENTER ONLY)
Abs Immature Granulocytes: 0.15 10*3/uL — ABNORMAL HIGH (ref 0.00–0.07)
Basophils Absolute: 0 10*3/uL (ref 0.0–0.1)
Basophils Relative: 0 %
Eosinophils Absolute: 0 10*3/uL (ref 0.0–0.5)
Eosinophils Relative: 2 %
HCT: 29.6 % — ABNORMAL LOW (ref 36.0–46.0)
Hemoglobin: 9.5 g/dL — ABNORMAL LOW (ref 12.0–15.0)
Immature Granulocytes: 8 %
Lymphocytes Relative: 37 %
Lymphs Abs: 0.7 10*3/uL (ref 0.7–4.0)
MCH: 31.9 pg (ref 26.0–34.0)
MCHC: 32.1 g/dL (ref 30.0–36.0)
MCV: 99.3 fL (ref 80.0–100.0)
Monocytes Absolute: 0.1 10*3/uL (ref 0.1–1.0)
Monocytes Relative: 5 %
Neutro Abs: 1 10*3/uL — ABNORMAL LOW (ref 1.7–7.7)
Neutrophils Relative %: 48 %
Platelet Count: 97 10*3/uL — ABNORMAL LOW (ref 150–400)
RBC: 2.98 MIL/uL — ABNORMAL LOW (ref 3.87–5.11)
RDW: 14.9 % (ref 11.5–15.5)
WBC Count: 2 10*3/uL — ABNORMAL LOW (ref 4.0–10.5)
nRBC: 0 % (ref 0.0–0.2)

## 2019-05-25 LAB — LACTATE DEHYDROGENASE: LDH: 192 U/L (ref 98–192)

## 2019-05-25 LAB — CMP (CANCER CENTER ONLY)
ALT: 74 U/L — ABNORMAL HIGH (ref 0–44)
AST: 34 U/L (ref 15–41)
Albumin: 3 g/dL — ABNORMAL LOW (ref 3.5–5.0)
Alkaline Phosphatase: 71 U/L (ref 38–126)
Anion gap: 9 (ref 5–15)
BUN: 24 mg/dL — ABNORMAL HIGH (ref 8–23)
CO2: 26 mmol/L (ref 22–32)
Calcium: 8 mg/dL — ABNORMAL LOW (ref 8.9–10.3)
Chloride: 107 mmol/L (ref 98–111)
Creatinine: 0.76 mg/dL (ref 0.44–1.00)
GFR, Est AFR Am: >60 mL/min (ref >60)
GFR, Estimated: >60 mL/min (ref >60)
Glucose, Bld: 122 mg/dL — ABNORMAL HIGH (ref 70–99)
Potassium: 3.8 mmol/L (ref 3.5–5.1)
Sodium: 142 mmol/L (ref 135–145)
Total Bilirubin: 0.8 mg/dL (ref 0.3–1.2)
Total Protein: 5.3 g/dL — ABNORMAL LOW (ref 6.5–8.1)

## 2019-05-25 MED ORDER — HEPARIN SOD (PORK) LOCK FLUSH 100 UNIT/ML IV SOLN
500.0000 [IU] | Freq: Once | INTRAVENOUS | Status: AC
  Administered 2019-05-25: 500 [IU]
  Filled 2019-05-25: qty 5

## 2019-05-25 MED ORDER — SODIUM CHLORIDE 0.9% FLUSH
10.0000 mL | Freq: Once | INTRAVENOUS | Status: AC
  Administered 2019-05-25: 10 mL
  Filled 2019-05-25: qty 10

## 2019-05-25 MED ORDER — FLUCONAZOLE 100 MG PO TABS: 100.0000 mg | ORAL_TABLET | Freq: Every day | ORAL | 0 refills | Status: DC

## 2019-05-25 NOTE — Telephone Encounter (Signed)
Spoke with husband(Arlen Hole) advised of pt echo @[facility]  on 6/11 @ 10:00. Husband verbalized understanding.

## 2019-05-25 NOTE — Progress Notes (Addendum)
[place] OFFICE PROGRESS NOTE   Diagnosis: Non-Hodgkin's lymphoma  INTERVAL HISTORY:   Ms. Hanisch returns as scheduled.  She completed cycle 1 CHOP/Rituxan 05/18/2019.  She is feeling much better.  Appetite and energy level have improved.  No fevers or sweats.  She had no nausea or vomiting following the chemotherapy.  No mouth sores.  Some constipation relieved with Dulcolax.  Objective:  Vital signs in last 24 hours:  Blood pressure (!) 120/43, pulse 98, temperature 98.1 F (36.7 C), temperature source Oral, resp. rate 18, height 5\' 7"  (1.702 m), weight 198 lb 4.8 oz (89.9 kg), SpO2 98 %.    HEENT: Early thrush right posterior buccal mucosa.  No ulcers. Lymphatics: Improved adenopathy left low neck/supraclavicular region with approximate 1 cm low left neck lymph node noted.  No right neck adenopathy.  Small, 1 cm or less, bilateral axillary lymph nodes.  Healed right axillary incision. Vascular: No leg edema. Neuro: Alert and oriented. Skin: Resolving ecchymosis right lower arm/wrist. Port-A-Cath without erythema.  Lab Results:  Lab Results  Component Value Date   WBC 2.0 (L) 05/25/2019   HGB 9.5 (L) 05/25/2019   HCT 29.6 (L) 05/25/2019   MCV 99.3 05/25/2019   PLT 97 (L) 05/25/2019   NEUTROABS PENDING 05/25/2019    Imaging:  No results found.  Medications: I have reviewed the patient's current medications.   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 Status post weekly Rituxan x4 03/01/2012 through 03/22/2012. She completed 4 "maintenance" doses of Rituxan, last on 12/19/2012. A restaging CT on 02/09/2013 showed no evidence of lymphoma.   Lymph node lateral to the thyroid bed on a neck ultrasound 02/21/2014, status post an FNA biopsy concerning for a lymphoproliferative disorder.  PET scan 09/28/2016 with active lymphoma  within the neck, chest, abdomen, pelvis; splenic enlargement and hypermetabolism suspicious for splenic involvement.  Initiation of Rituxan weekly 4 09/29/2016  Initiation of maintenance Rituxan on a 3 month schedule 12/23/2016; final Rituxan 08/31/2018  Thyroid ultrasound 02/07/2019-left cervical lymphadenopathy  PET scan 03/08/2019-extensive recurrent hypermetabolic lymphoma involving the neck, chest, abdomen and pelvis.  03/16/2019 left cervical lymph node biopsy-features consistent with previously diagnosed non-Hodgkin's B-cell lymphoma, phenotypically consistent with marginal zone lymphoma. Flow cytometry with lambda restricted B-cell population without expression of CD5 or CD10 comprising 87% of all lymphocytes.  Cycle 1 bendamustine/Rituxan 03/22/2019  Excision deep left axillary lymph nodes 05/04/2019- non-Hodgkin's B-cell lymphoma with differential including a marginal zone lymphoma and atypical small lymphocytic lymphoma.  Flow cytometry with monoclonal B-cell population without expression of CD5 or CD10, comprises 96% of all lymphocytes.  Cycle 1 CHOP/Rituxan 05/18/2019 2. Stage IV (T1bN1b M0) papillary thyroid cancer, status post a thyroidectomy with reimplantation of the left superior parathyroid gland on 05/23/2012, status post radioactive iodine therapy, followed by Dr. Buddy Duty.  3. Stage II (T3 N0) colon cancer, status post a right colectomy 10/19/2011, last colonoscopy April 2015-sigmoid adenoma removed.  4. History of a pulmonary embolism December 2012.  5. History of Atrial fibrillation 6. Iron deficiency anemia-new 03/18/2014. Hemoccult positive stool. The anemia corrected with iron. No longer taking iron.  Status post an upper endoscopy and colonoscopy by Dr. Carlean Purl April 2015 with no bleeding source identified, benign adenoma removed from the sigmoid colon. 7. Report of an upper gastric intestinal bleed fall 2016-managed in [place]. [name] 8. Left knee replacement May 2017,  repeat left knee surgery May 2018 9. Pruritic rash 07/22/2016  10. Nausea and diarrhea 04/02/2019-stool negative for C. difficile toxin   Disposition: Tracy Mclaughlin completed cycle 1 CHOP/Rituxan 05/18/2019.  She appears improved.  Appetite and energy level have improved significantly.  Peripheral adenopathy is also better.  We reviewed the CBC from today.  She has pancytopenia.  She understands to contact the office with fever, chills, other signs of infection, bleeding.  She will return for a follow-up CBC in 1 week.  She has mild thrush.  She will complete a 4-day course of Diflucan.  She will have a 2D echo prior to cycle 2 CHOP/Rituxan.  She will return for lab, follow-up, cycle 2 CHOP/Rituxan in 2 weeks.  She will contact the office in the interim with any problems.  Patient seen with Dr. Benay Spice.    Ned Card ANP/GNP-BC   05/25/2019  10:49 AM This was a shared visit with Ned Card.  Ms. Kovich is now at day 8 following cycle 1 CHOP/rituximab.  Her clinical status appears significantly improved.  Julieanne Manson, MD

## 2019-05-28 ENCOUNTER — Telehealth: Payer: Self-pay | Admitting: Nurse Practitioner

## 2019-05-28 NOTE — Telephone Encounter (Signed)
Scheduled appt per 6/5 los, was not able to reach patient.

## 2019-05-31 ENCOUNTER — Other Ambulatory Visit: Payer: Self-pay | Admitting: Nurse Practitioner

## 2019-05-31 ENCOUNTER — Inpatient Hospital Stay: Payer: Medicare PPO

## 2019-05-31 ENCOUNTER — Other Ambulatory Visit: Payer: Self-pay

## 2019-05-31 ENCOUNTER — Telehealth: Payer: Self-pay | Admitting: *Deleted

## 2019-05-31 ENCOUNTER — Ambulatory Visit (HOSPITAL_COMMUNITY)
Admission: RE | Admit: 2019-05-31 | Discharge: 2019-05-31 | Disposition: A | Discharge status: Home / Self Care | Payer: Medicare PPO | Source: Ambulatory Visit | Attending: Nurse Practitioner | Admitting: Nurse Practitioner

## 2019-05-31 DIAGNOSIS — Z5112 Encounter for antineoplastic immunotherapy: Secondary | ICD-10-CM | POA: Diagnosis not present

## 2019-05-31 DIAGNOSIS — C73 Malignant neoplasm of thyroid gland: Secondary | ICD-10-CM

## 2019-05-31 DIAGNOSIS — C8518 Unspecified B-cell lymphoma, lymph nodes of multiple sites: Secondary | ICD-10-CM | POA: Diagnosis not present

## 2019-05-31 DIAGNOSIS — I739 Peripheral vascular disease, unspecified: Secondary | ICD-10-CM | POA: Insufficient documentation

## 2019-05-31 DIAGNOSIS — I340 Nonrheumatic mitral (valve) insufficiency: Secondary | ICD-10-CM | POA: Diagnosis not present

## 2019-05-31 DIAGNOSIS — Z86711 Personal history of pulmonary embolism: Secondary | ICD-10-CM | POA: Diagnosis not present

## 2019-05-31 DIAGNOSIS — I10 Essential (primary) hypertension: Secondary | ICD-10-CM

## 2019-05-31 DIAGNOSIS — Z95828 Presence of other vascular implants and grafts: Secondary | ICD-10-CM

## 2019-05-31 LAB — CBC WITH DIFFERENTIAL (CANCER CENTER ONLY)
Abs Immature Granulocytes: 0.16 10*3/uL — ABNORMAL HIGH (ref 0.00–0.07)
Basophils Absolute: 0 10*3/uL (ref 0.0–0.1)
Basophils Relative: 1 %
Eosinophils Absolute: 0 10*3/uL (ref 0.0–0.5)
Eosinophils Relative: 1 %
HCT: 29.1 % — ABNORMAL LOW (ref 36.0–46.0)
Hemoglobin: 9.6 g/dL — ABNORMAL LOW (ref 12.0–15.0)
Immature Granulocytes: 3 %
Lymphocytes Relative: 18 %
Lymphs Abs: 1.1 10*3/uL (ref 0.7–4.0)
MCH: 31.8 pg (ref 26.0–34.0)
MCHC: 33 g/dL (ref 30.0–36.0)
MCV: 96.4 fL (ref 80.0–100.0)
Monocytes Absolute: 0.5 10*3/uL (ref 0.1–1.0)
Monocytes Relative: 8 %
Neutro Abs: 4.2 10*3/uL (ref 1.7–7.7)
Neutrophils Relative %: 69 %
Platelet Count: 152 10*3/uL (ref 150–400)
RBC: 3.02 MIL/uL — ABNORMAL LOW (ref 3.87–5.11)
RDW: 15.6 % — ABNORMAL HIGH (ref 11.5–15.5)
WBC Count: 5.9 10*3/uL (ref 4.0–10.5)
nRBC: 0 % (ref 0.0–0.2)

## 2019-05-31 MED ORDER — HEPARIN SOD (PORK) LOCK FLUSH 100 UNIT/ML IV SOLN
500.0000 [IU] | Freq: Once | INTRAVENOUS | Status: AC
  Administered 2019-05-31: 500 [IU]
  Filled 2019-05-31: qty 5

## 2019-05-31 MED ORDER — SODIUM CHLORIDE 0.9% FLUSH
10.0000 mL | Freq: Once | INTRAVENOUS | Status: AC
  Administered 2019-05-31: 10 mL
  Filled 2019-05-31: qty 10

## 2019-05-31 NOTE — Telephone Encounter (Signed)
Telephone call to patient and advised lab results as directed below.

## 2019-05-31 NOTE — Telephone Encounter (Signed)
-----   Message from Owens Shark, NP sent at 05/31/2019 10:47 AM EDT ----- Please let her know the blood counts are better.  Follow-up as scheduled.

## 2019-06-05 ENCOUNTER — Other Ambulatory Visit: Payer: Self-pay | Admitting: Oncology

## 2019-06-08 ENCOUNTER — Inpatient Hospital Stay: Payer: Medicare PPO

## 2019-06-08 ENCOUNTER — Other Ambulatory Visit: Payer: Self-pay | Admitting: Nurse Practitioner

## 2019-06-08 ENCOUNTER — Other Ambulatory Visit: Payer: Self-pay

## 2019-06-08 ENCOUNTER — Inpatient Hospital Stay (HOSPITAL_BASED_OUTPATIENT_CLINIC_OR_DEPARTMENT_OTHER): Payer: Medicare PPO | Admitting: Nurse Practitioner

## 2019-06-08 ENCOUNTER — Encounter: Payer: Self-pay | Admitting: Nurse Practitioner

## 2019-06-08 VITALS — BP 114/39 | HR 73 | Temp 98.2°F | Resp 19 | Ht 67.0 in | Wt 189.5 lb

## 2019-06-08 VITALS — BP 97/44 | HR 59 | Temp 97.9°F | Resp 16

## 2019-06-08 DIAGNOSIS — C8261 Cutaneous follicle center lymphoma, lymph nodes of head, face, and neck: Secondary | ICD-10-CM | POA: Diagnosis not present

## 2019-06-08 DIAGNOSIS — C8518 Unspecified B-cell lymphoma, lymph nodes of multiple sites: Secondary | ICD-10-CM

## 2019-06-08 DIAGNOSIS — Z5112 Encounter for antineoplastic immunotherapy: Secondary | ICD-10-CM | POA: Diagnosis not present

## 2019-06-08 DIAGNOSIS — R0609 Other forms of dyspnea: Secondary | ICD-10-CM

## 2019-06-08 DIAGNOSIS — R200 Anesthesia of skin: Secondary | ICD-10-CM

## 2019-06-08 DIAGNOSIS — C73 Malignant neoplasm of thyroid gland: Secondary | ICD-10-CM

## 2019-06-08 DIAGNOSIS — K5900 Constipation, unspecified: Secondary | ICD-10-CM

## 2019-06-08 DIAGNOSIS — Z95828 Presence of other vascular implants and grafts: Secondary | ICD-10-CM

## 2019-06-08 DIAGNOSIS — R05 Cough: Secondary | ICD-10-CM

## 2019-06-08 DIAGNOSIS — R630 Anorexia: Secondary | ICD-10-CM

## 2019-06-08 LAB — CMP (CANCER CENTER ONLY)
ALT: 13 U/L (ref 0–44)
AST: 25 U/L (ref 15–41)
Albumin: 3 g/dL — ABNORMAL LOW (ref 3.5–5.0)
Alkaline Phosphatase: 102 U/L (ref 38–126)
Anion gap: 10 (ref 5–15)
BUN: 14 mg/dL (ref 8–23)
CO2: 25 mmol/L (ref 22–32)
Calcium: 8.8 mg/dL — ABNORMAL LOW (ref 8.9–10.3)
Chloride: 102 mmol/L (ref 98–111)
Creatinine: 1.1 mg/dL — ABNORMAL HIGH (ref 0.44–1.00)
GFR, Est AFR Am: 54 mL/min — ABNORMAL LOW (ref >60)
GFR, Estimated: 47 mL/min — ABNORMAL LOW (ref >60)
Glucose, Bld: 108 mg/dL — ABNORMAL HIGH (ref 70–99)
Potassium: 4.4 mmol/L (ref 3.5–5.1)
Sodium: 137 mmol/L (ref 135–145)
Total Bilirubin: 0.6 mg/dL (ref 0.3–1.2)
Total Protein: 5.8 g/dL — ABNORMAL LOW (ref 6.5–8.1)

## 2019-06-08 LAB — CBC WITH DIFFERENTIAL (CANCER CENTER ONLY)
Abs Immature Granulocytes: 0.19 10*3/uL — ABNORMAL HIGH (ref 0.00–0.07)
Basophils Absolute: 0.1 10*3/uL (ref 0.0–0.1)
Basophils Relative: 1 %
Eosinophils Absolute: 0.1 10*3/uL (ref 0.0–0.5)
Eosinophils Relative: 1 %
HCT: 33.6 % — ABNORMAL LOW (ref 36.0–46.0)
Hemoglobin: 10.9 g/dL — ABNORMAL LOW (ref 12.0–15.0)
Immature Granulocytes: 2 %
Lymphocytes Relative: 16 %
Lymphs Abs: 1.6 10*3/uL (ref 0.7–4.0)
MCH: 31.8 pg (ref 26.0–34.0)
MCHC: 32.4 g/dL (ref 30.0–36.0)
MCV: 98 fL (ref 80.0–100.0)
Monocytes Absolute: 1.2 10*3/uL — ABNORMAL HIGH (ref 0.1–1.0)
Monocytes Relative: 12 %
Neutro Abs: 6.7 10*3/uL (ref 1.7–7.7)
Neutrophils Relative %: 68 %
Platelet Count: 310 10*3/uL (ref 150–400)
RBC: 3.43 MIL/uL — ABNORMAL LOW (ref 3.87–5.11)
RDW: 16.4 % — ABNORMAL HIGH (ref 11.5–15.5)
WBC Count: 9.9 10*3/uL (ref 4.0–10.5)
nRBC: 0 % (ref 0.0–0.2)

## 2019-06-08 LAB — LACTATE DEHYDROGENASE: LDH: 591 U/L — ABNORMAL HIGH (ref 98–192)

## 2019-06-08 MED ORDER — ACETAMINOPHEN 325 MG PO TABS
ORAL_TABLET | ORAL | Status: AC
  Filled 2019-06-08: qty 2

## 2019-06-08 MED ORDER — SODIUM CHLORIDE 0.9 % IV SOLN
Freq: Once | INTRAVENOUS | Status: AC
  Administered 2019-06-08: 12:00:00 via INTRAVENOUS
  Filled 2019-06-08: qty 5

## 2019-06-08 MED ORDER — DIPHENHYDRAMINE HCL 25 MG PO CAPS
50.0000 mg | ORAL_CAPSULE | Freq: Once | ORAL | Status: AC
  Administered 2019-06-08: 13:00:00 50 mg via ORAL

## 2019-06-08 MED ORDER — VINCRISTINE SULFATE CHEMO INJECTION 1 MG/ML
1.0000 mg | Freq: Once | INTRAVENOUS | Status: AC
  Administered 2019-06-08: 14:00:00 1 mg via INTRAVENOUS
  Filled 2019-06-08: qty 1

## 2019-06-08 MED ORDER — PREDNISONE 20 MG PO TABS: ORAL_TABLET | ORAL | 1 refills | Status: DC

## 2019-06-08 MED ORDER — SODIUM CHLORIDE 0.9 % IV SOLN
600.0000 mg/m2 | Freq: Once | INTRAVENOUS | Status: AC
  Administered 2019-06-08: 14:00:00 1240 mg via INTRAVENOUS
  Filled 2019-06-08: qty 62

## 2019-06-08 MED ORDER — FAMOTIDINE IN NACL 20-0.9 MG/50ML-% IV SOLN
INTRAVENOUS | Status: AC
  Filled 2019-06-08: qty 50

## 2019-06-08 MED ORDER — DIPHENHYDRAMINE HCL 50 MG/ML IJ SOLN
INTRAMUSCULAR | Status: AC
  Filled 2019-06-08: qty 1

## 2019-06-08 MED ORDER — SODIUM CHLORIDE 0.9% FLUSH
10.0000 mL | Freq: Once | INTRAVENOUS | Status: AC
  Administered 2019-06-08: 10:00:00 10 mL
  Filled 2019-06-08: qty 10

## 2019-06-08 MED ORDER — PEGFILGRASTIM 6 MG/0.6ML ~~LOC~~ PSKT
PREFILLED_SYRINGE | SUBCUTANEOUS | Status: AC
  Filled 2019-06-08: qty 0.6

## 2019-06-08 MED ORDER — SODIUM CHLORIDE 0.9% FLUSH
10.0000 mL | INTRAVENOUS | Status: DC | PRN
  Administered 2019-06-08: 10 mL
  Filled 2019-06-08: qty 10

## 2019-06-08 MED ORDER — PEGFILGRASTIM 6 MG/0.6ML ~~LOC~~ PSKT
6.0000 mg | PREFILLED_SYRINGE | Freq: Once | SUBCUTANEOUS | Status: AC
  Administered 2019-06-08: 6 mg via SUBCUTANEOUS

## 2019-06-08 MED ORDER — FAMOTIDINE IN NACL 20-0.9 MG/50ML-% IV SOLN
20.0000 mg | Freq: Once | INTRAVENOUS | Status: AC
  Administered 2019-06-08: 12:00:00 20 mg via INTRAVENOUS

## 2019-06-08 MED ORDER — SODIUM CHLORIDE 0.9 % IV SOLN
Freq: Once | INTRAVENOUS | Status: AC
  Administered 2019-06-08: 12:00:00 via INTRAVENOUS
  Filled 2019-06-08: qty 250

## 2019-06-08 MED ORDER — DIPHENHYDRAMINE HCL 25 MG PO CAPS
ORAL_CAPSULE | ORAL | Status: AC
  Filled 2019-06-08: qty 2

## 2019-06-08 MED ORDER — SODIUM CHLORIDE 0.9 % IV SOLN
375.0000 mg/m2 | Freq: Once | INTRAVENOUS | Status: AC
  Administered 2019-06-08: 800 mg via INTRAVENOUS
  Filled 2019-06-08: qty 50

## 2019-06-08 MED ORDER — PALONOSETRON HCL INJECTION 0.25 MG/5ML
0.2500 mg | Freq: Once | INTRAVENOUS | Status: AC
  Administered 2019-06-08: 13:00:00 0.25 mg via INTRAVENOUS

## 2019-06-08 MED ORDER — HEPARIN SOD (PORK) LOCK FLUSH 100 UNIT/ML IV SOLN
500.0000 [IU] | Freq: Once | INTRAVENOUS | Status: AC | PRN
  Administered 2019-06-08: 500 [IU]
  Filled 2019-06-08: qty 5

## 2019-06-08 MED ORDER — ACETAMINOPHEN 325 MG PO TABS
650.0000 mg | ORAL_TABLET | Freq: Once | ORAL | Status: AC
  Administered 2019-06-08: 13:00:00 650 mg via ORAL

## 2019-06-08 MED ORDER — DOXORUBICIN HCL CHEMO IV INJECTION 2 MG/ML
40.0000 mg/m2 | Freq: Once | INTRAVENOUS | Status: AC
  Administered 2019-06-08: 14:00:00 82 mg via INTRAVENOUS
  Filled 2019-06-08: qty 41

## 2019-06-08 MED ORDER — PALONOSETRON HCL INJECTION 0.25 MG/5ML
INTRAVENOUS | Status: AC
  Filled 2019-06-08: qty 5

## 2019-06-08 NOTE — Patient Instructions (Signed)
[facility] Discharge Instructions for Patients Receiving Chemotherapy  Today you received the following chemotherapy agents: Adriamycin, vicristine, cytoxan, and rituxan.  To help prevent nausea and vomiting after your treatment, we encourage you to take your nausea medication as directed   If you develop nausea and vomiting that is not controlled by your nausea medication, call the clinic.   BELOW ARE SYMPTOMS THAT SHOULD BE REPORTED IMMEDIATELY:  *FEVER GREATER THAN 100.5 F  *CHILLS WITH OR WITHOUT FEVER  NAUSEA AND VOMITING THAT IS NOT CONTROLLED WITH YOUR NAUSEA MEDICATION  *UNUSUAL SHORTNESS OF BREATH  *UNUSUAL BRUISING OR BLEEDING  TENDERNESS IN MOUTH AND THROAT WITH OR WITHOUT PRESENCE OF ULCERS  *URINARY PROBLEMS  *BOWEL PROBLEMS  UNUSUAL RASH Items with * indicate a potential emergency and should be followed up as soon as possible.  Feel free to call the clinic should you have any questions or concerns. The clinic phone number is [phone].  Please show the [facility] at check-in to the Emergency Department and triage nurse.

## 2019-06-08 NOTE — Progress Notes (Signed)
Notified patient that her LDH is higher. MD wants to do scan on 6/29 am prior to her visit. She understands and agrees. Wants to drink the water based contrast in radiology for scan

## 2019-06-08 NOTE — Progress Notes (Signed)
[place] OFFICE PROGRESS NOTE   Diagnosis: Non-Hodgkin's lymphoma  INTERVAL HISTORY:   Tracy Mclaughlin returns as scheduled.  She completed cycle 1 CHOP/Rituxan 05/18/2019.  Overall she has been feeling well.  She felt a little "weak" this morning.  No fevers or sweats.  She reports appetite is poor but she is eating 3 meals a day and drinking a nutritional supplement.  She likes certain foods more than others.  She is planning on eating banana pudding this weekend.  Energy level continues to be improved as compared to pretreatment.  She is mobile throughout her home.  She is doing daily leg exercises.  She has stable dyspnea on exertion and cough.  She has intermittent constipation.  She takes a laxative as needed.  She has stable numbness in 4 toes on the right foot which predated the start of chemotherapy.  Objective:  Vital signs in last 24 hours:  Blood pressure (!) 114/39, pulse 73, temperature 98.2 F (36.8 C), temperature source Oral, resp. rate 19, height 5\' 7"  (1.702 m), weight 189 lb 8 oz (86 kg), SpO2 97 %.    HEENT: No thrush or ulcers. Lymphatics: 1 cm left low neck/supraclavicular lymph node; question smaller lymph node just anterior to this.  Small, 1 cm or less, bilateral axillary lymph nodes. Resp: Lungs clear bilaterally. Cardio: Regular rate and rhythm. GI: Abdomen soft and nontender.  No hepatosplenomegaly. Vascular: No leg edema. Neuro: Alert and oriented. Skin: Palms without erythema. Port-A-Cath without erythema.   Lab Results:  Lab Results  Component Value Date   WBC 9.9 06/08/2019   HGB 10.9 (L) 06/08/2019   HCT 33.6 (L) 06/08/2019   MCV 98.0 06/08/2019   PLT 310 06/08/2019   NEUTROABS 6.7 06/08/2019    Imaging:  No results found.  Medications: I have reviewed the patient's current medications.  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 Status post weekly Rituxan x4 03/01/2012 through 03/22/2012. She completed 4 "maintenance" doses of Rituxan, last on 12/19/2012. A restaging CT on 02/09/2013 showed no evidence of lymphoma.   Lymph node lateral to the thyroid bed on a neck ultrasound 02/21/2014, status post an FNA biopsy concerning for a lymphoproliferative disorder.  PET scan 09/28/2016 with active lymphoma within the neck, chest, abdomen, pelvis; splenic enlargement and hypermetabolism suspicious for splenic involvement.  Initiation of Rituxan weekly 4 09/29/2016  Initiation of maintenance Rituxan on a 3 month schedule 12/23/2016; final Rituxan 08/31/2018  Thyroid ultrasound 02/07/2019-left cervical lymphadenopathy  PET scan 03/08/2019-extensive recurrent hypermetabolic lymphoma involving the neck, chest, abdomen and pelvis.  03/16/2019 left cervical lymph node biopsy-features consistent with previously diagnosed non-Hodgkin's B-cell lymphoma, phenotypically consistent with marginal zone lymphoma. Flow cytometry with lambda restricted B-cell population without expression of CD5 or CD10 comprising 87% of all lymphocytes.  Cycle 1 bendamustine/Rituxan 03/22/2019  Excision deep left axillary lymph nodes 05/04/2019-non-Hodgkin's B-cell lymphoma with differential including a marginal zone lymphoma and atypical small lymphocytic lymphoma. Flow cytometry with monoclonal B-cell population without expression of CD5 or CD10, comprises 96% of all lymphocytes.  Cycle 1 CHOP/Rituxan 05/18/2019  Cycle 2 CHOP/Rituxan 06/08/2019 2. Stage IV (T1bN1b M0) papillary thyroid cancer, status post a thyroidectomy with reimplantation of the left superior parathyroid gland on 05/23/2012, status post radioactive iodine therapy, followed by Dr. Buddy Duty.  3. Stage II (T3 N0) colon cancer, status post a right colectomy 10/19/2011, last colonoscopy April 2015-sigmoid adenoma removed.  4. History of  a pulmonary  embolism December 2012.  5. History of Atrial fibrillation 6. Iron deficiency anemia-new 03/18/2014. Hemoccult positive stool. The anemia corrected with iron. No longer taking iron.  Status post an upper endoscopy and colonoscopy by Dr. Carlean Purl April 2015 with no bleeding source identified, benign adenoma removed from the sigmoid colon. 7. Report of an upper gastric intestinal bleed fall 2016-managed in [place]. [name] 8. Left knee replacement May 2017, repeat left knee surgery May 2018 9. Pruritic rash 07/22/2016 10. Nausea and diarrhea 04/02/2019-stool negative for C. difficile toxin   Disposition: Tracy Mclaughlin appears stable.  She has completed 1 cycle of CHOP/Rituxan with clinical improvement.  Plan to proceed with cycle 2 today as scheduled.  We reviewed the CBC from today.  Counts are adequate for treatment.  She will return for lab and follow-up on 06/18/2019.  She will contact the office in the interim with any problems.    Ned Card ANP/GNP-BC   06/08/2019  11:16 AM

## 2019-06-09 ENCOUNTER — Telehealth: Payer: Self-pay | Admitting: Oncology

## 2019-06-09 NOTE — Telephone Encounter (Signed)
Spoke with relative re 6/29 appointments. Patient will back lab/port/scan in the morning and f/u in the afternoon. Central radiology will call re scan. Relative has number to f/u on scan if needed.

## 2019-06-11 ENCOUNTER — Telehealth: Payer: Self-pay | Admitting: *Deleted

## 2019-06-11 ENCOUNTER — Telehealth: Payer: Self-pay | Admitting: Nurse Practitioner

## 2019-06-11 NOTE — Telephone Encounter (Signed)
Called to ask about her lab/flush on 6/29 be moved to closer to visit with NP. Informed him that the lab needs to be done on 6/26 prior to the scan. Scheduling message sent to move lab/flush to ~ 1:15 on 6/26 for CT scan. Informed him the scheduler will be calling him.

## 2019-06-11 NOTE — Telephone Encounter (Signed)
I talk with patient regarding schedule  

## 2019-06-12 ENCOUNTER — Other Ambulatory Visit: Payer: Self-pay | Admitting: Nurse Practitioner

## 2019-06-12 ENCOUNTER — Telehealth: Payer: Self-pay | Admitting: Nurse Practitioner

## 2019-06-12 NOTE — Telephone Encounter (Signed)
Called and spoke with patient. Confirmed date and time of appts  ° °

## 2019-06-15 ENCOUNTER — Inpatient Hospital Stay: Payer: Medicare PPO

## 2019-06-15 ENCOUNTER — Other Ambulatory Visit: Payer: Medicare PPO

## 2019-06-15 ENCOUNTER — Telehealth: Payer: Self-pay | Admitting: Oncology

## 2019-06-15 ENCOUNTER — Other Ambulatory Visit: Payer: Self-pay | Admitting: Nurse Practitioner

## 2019-06-15 ENCOUNTER — Other Ambulatory Visit: Payer: Self-pay

## 2019-06-15 ENCOUNTER — Ambulatory Visit (HOSPITAL_COMMUNITY)
Admission: RE | Admit: 2019-06-15 | Discharge: 2019-06-15 | Disposition: A | Discharge status: Home / Self Care | Payer: Medicare PPO | Source: Ambulatory Visit | Attending: Nurse Practitioner | Admitting: Nurse Practitioner

## 2019-06-15 DIAGNOSIS — D61818 Other pancytopenia: Secondary | ICD-10-CM | POA: Diagnosis not present

## 2019-06-15 DIAGNOSIS — D61810 Antineoplastic chemotherapy induced pancytopenia: Secondary | ICD-10-CM | POA: Diagnosis not present

## 2019-06-15 DIAGNOSIS — C8518 Unspecified B-cell lymphoma, lymph nodes of multiple sites: Secondary | ICD-10-CM

## 2019-06-15 DIAGNOSIS — C73 Malignant neoplasm of thyroid gland: Secondary | ICD-10-CM

## 2019-06-15 DIAGNOSIS — Z95828 Presence of other vascular implants and grafts: Secondary | ICD-10-CM

## 2019-06-15 LAB — CBC WITH DIFFERENTIAL (CANCER CENTER ONLY)
Abs Immature Granulocytes: 0.02 10*3/uL (ref 0.00–0.07)
Basophils Absolute: 0.1 10*3/uL (ref 0.0–0.1)
Basophils Relative: 4 %
Eosinophils Absolute: 0 10*3/uL (ref 0.0–0.5)
Eosinophils Relative: 1 %
HCT: 25.2 % — ABNORMAL LOW (ref 36.0–46.0)
Hemoglobin: 8.2 g/dL — ABNORMAL LOW (ref 12.0–15.0)
Immature Granulocytes: 1 %
Lymphocytes Relative: 23 %
Lymphs Abs: 0.3 10*3/uL — ABNORMAL LOW (ref 0.7–4.0)
MCH: 31.3 pg (ref 26.0–34.0)
MCHC: 32.5 g/dL (ref 30.0–36.0)
MCV: 96.2 fL (ref 80.0–100.0)
Monocytes Absolute: 0 10*3/uL — ABNORMAL LOW (ref 0.1–1.0)
Monocytes Relative: 3 %
Neutro Abs: 1 10*3/uL — ABNORMAL LOW (ref 1.7–7.7)
Neutrophils Relative %: 68 %
Platelet Count: 121 10*3/uL — ABNORMAL LOW (ref 150–400)
RBC: 2.62 MIL/uL — ABNORMAL LOW (ref 3.87–5.11)
RDW: 15.8 % — ABNORMAL HIGH (ref 11.5–15.5)
WBC Count: 1.4 10*3/uL — ABNORMAL LOW (ref 4.0–10.5)
nRBC: 0 % (ref 0.0–0.2)

## 2019-06-15 LAB — CMP (CANCER CENTER ONLY)
ALT: 82 U/L — ABNORMAL HIGH (ref 0–44)
AST: 55 U/L — ABNORMAL HIGH (ref 15–41)
Albumin: 3.1 g/dL — ABNORMAL LOW (ref 3.5–5.0)
Alkaline Phosphatase: 87 U/L (ref 38–126)
Anion gap: 7 (ref 5–15)
BUN: 21 mg/dL (ref 8–23)
CO2: 26 mmol/L (ref 22–32)
Calcium: 7.9 mg/dL — ABNORMAL LOW (ref 8.9–10.3)
Chloride: 104 mmol/L (ref 98–111)
Creatinine: 0.76 mg/dL (ref 0.44–1.00)
GFR, Est AFR Am: >60 mL/min (ref >60)
GFR, Estimated: >60 mL/min (ref >60)
Glucose, Bld: 85 mg/dL (ref 70–99)
Potassium: 4.3 mmol/L (ref 3.5–5.1)
Sodium: 137 mmol/L (ref 135–145)
Total Bilirubin: 1.1 mg/dL (ref 0.3–1.2)
Total Protein: 5.2 g/dL — ABNORMAL LOW (ref 6.5–8.1)

## 2019-06-15 LAB — LACTATE DEHYDROGENASE: LDH: 950 U/L — ABNORMAL HIGH (ref 98–192)

## 2019-06-15 MED ORDER — SODIUM CHLORIDE 0.9% FLUSH
10.0000 mL | Freq: Once | INTRAVENOUS | Status: AC
  Administered 2019-06-15: 10 mL
  Filled 2019-06-15: qty 10

## 2019-06-15 MED ORDER — SODIUM CHLORIDE (PF) 0.9 % IJ SOLN
INTRAMUSCULAR | Status: AC
  Filled 2019-06-15: qty 50

## 2019-06-15 MED ORDER — HEPARIN SOD (PORK) LOCK FLUSH 100 UNIT/ML IV SOLN
500.0000 [IU] | Freq: Once | INTRAVENOUS | Status: AC
  Administered 2019-06-15: 500 [IU] via INTRAVENOUS

## 2019-06-15 MED ORDER — IOHEXOL 300 MG/ML  SOLN
100.0000 mL | Freq: Once | INTRAMUSCULAR | Status: AC | PRN
  Administered 2019-06-15: 100 mL via INTRAVENOUS

## 2019-06-15 MED ORDER — HEPARIN SOD (PORK) LOCK FLUSH 100 UNIT/ML IV SOLN
INTRAVENOUS | Status: AC
  Administered 2019-06-15: 15:00:00 500 [IU] via INTRAVENOUS
  Filled 2019-06-15: qty 5

## 2019-06-15 NOTE — Telephone Encounter (Signed)
Called pt per 6/26 sch message - unable to reach pt - left message with appt date and time

## 2019-06-18 ENCOUNTER — Inpatient Hospital Stay (HOSPITAL_COMMUNITY)
Admission: EM | Admit: 2019-06-18 | Discharge: 2019-06-20 | DRG: 809 | Disposition: A | Discharge status: Home / Self Care | Payer: Medicare PPO | Attending: Internal Medicine | Admitting: Internal Medicine

## 2019-06-18 ENCOUNTER — Encounter (HOSPITAL_COMMUNITY): Payer: Self-pay | Admitting: Emergency Medicine

## 2019-06-18 ENCOUNTER — Emergency Department (HOSPITAL_COMMUNITY): Payer: Medicare PPO

## 2019-06-18 ENCOUNTER — Other Ambulatory Visit: Payer: Medicare PPO

## 2019-06-18 ENCOUNTER — Telehealth: Payer: Self-pay | Admitting: *Deleted

## 2019-06-18 ENCOUNTER — Inpatient Hospital Stay: Payer: Medicare PPO

## 2019-06-18 ENCOUNTER — Other Ambulatory Visit: Payer: Self-pay

## 2019-06-18 ENCOUNTER — Inpatient Hospital Stay: Payer: Medicare PPO | Admitting: Nurse Practitioner

## 2019-06-18 DIAGNOSIS — T451X5A Adverse effect of antineoplastic and immunosuppressive drugs, initial encounter: Secondary | ICD-10-CM | POA: Diagnosis present

## 2019-06-18 DIAGNOSIS — D6959 Other secondary thrombocytopenia: Secondary | ICD-10-CM | POA: Diagnosis present

## 2019-06-18 DIAGNOSIS — D61818 Other pancytopenia: Secondary | ICD-10-CM | POA: Diagnosis not present

## 2019-06-18 DIAGNOSIS — I10 Essential (primary) hypertension: Secondary | ICD-10-CM | POA: Diagnosis not present

## 2019-06-18 DIAGNOSIS — Z86711 Personal history of pulmonary embolism: Secondary | ICD-10-CM

## 2019-06-18 DIAGNOSIS — Z8249 Family history of ischemic heart disease and other diseases of the circulatory system: Secondary | ICD-10-CM

## 2019-06-18 DIAGNOSIS — Z85038 Personal history of other malignant neoplasm of large intestine: Secondary | ICD-10-CM

## 2019-06-18 DIAGNOSIS — Z79891 Long term (current) use of opiate analgesic: Secondary | ICD-10-CM

## 2019-06-18 DIAGNOSIS — Z9049 Acquired absence of other specified parts of digestive tract: Secondary | ICD-10-CM

## 2019-06-18 DIAGNOSIS — E890 Postprocedural hypothyroidism: Secondary | ICD-10-CM | POA: Diagnosis present

## 2019-06-18 DIAGNOSIS — E860 Dehydration: Secondary | ICD-10-CM | POA: Diagnosis present

## 2019-06-18 DIAGNOSIS — Z833 Family history of diabetes mellitus: Secondary | ICD-10-CM

## 2019-06-18 DIAGNOSIS — Z79899 Other long term (current) drug therapy: Secondary | ICD-10-CM

## 2019-06-18 DIAGNOSIS — Z801 Family history of malignant neoplasm of trachea, bronchus and lung: Secondary | ICD-10-CM

## 2019-06-18 DIAGNOSIS — Z885 Allergy status to narcotic agent status: Secondary | ICD-10-CM

## 2019-06-18 DIAGNOSIS — D61810 Antineoplastic chemotherapy induced pancytopenia: Principal | ICD-10-CM | POA: Diagnosis present

## 2019-06-18 DIAGNOSIS — C8514 Unspecified B-cell lymphoma, lymph nodes of axilla and upper limb: Secondary | ICD-10-CM | POA: Diagnosis not present

## 2019-06-18 DIAGNOSIS — D509 Iron deficiency anemia, unspecified: Secondary | ICD-10-CM | POA: Diagnosis present

## 2019-06-18 DIAGNOSIS — E876 Hypokalemia: Secondary | ICD-10-CM | POA: Diagnosis present

## 2019-06-18 DIAGNOSIS — C8518 Unspecified B-cell lymphoma, lymph nodes of multiple sites: Secondary | ICD-10-CM | POA: Diagnosis present

## 2019-06-18 DIAGNOSIS — D649 Anemia, unspecified: Secondary | ICD-10-CM | POA: Diagnosis present

## 2019-06-18 DIAGNOSIS — Z66 Do not resuscitate: Secondary | ICD-10-CM | POA: Diagnosis present

## 2019-06-18 DIAGNOSIS — Z87891 Personal history of nicotine dependence: Secondary | ICD-10-CM

## 2019-06-18 DIAGNOSIS — I480 Paroxysmal atrial fibrillation: Secondary | ICD-10-CM | POA: Diagnosis present

## 2019-06-18 DIAGNOSIS — Z85850 Personal history of malignant neoplasm of thyroid: Secondary | ICD-10-CM

## 2019-06-18 DIAGNOSIS — Z79890 Hormone replacement therapy (postmenopausal): Secondary | ICD-10-CM

## 2019-06-18 DIAGNOSIS — C8590 Non-Hodgkin lymphoma, unspecified, unspecified site: Secondary | ICD-10-CM | POA: Diagnosis present

## 2019-06-18 DIAGNOSIS — Z20828 Contact with and (suspected) exposure to other viral communicable diseases: Secondary | ICD-10-CM | POA: Diagnosis present

## 2019-06-18 LAB — COMPREHENSIVE METABOLIC PANEL
ALT: 38 U/L (ref 0–44)
AST: 18 U/L (ref 15–41)
Albumin: 2.8 g/dL — ABNORMAL LOW (ref 3.5–5.0)
Alkaline Phosphatase: 59 U/L (ref 38–126)
Anion gap: 11 (ref 5–15)
BUN: 14 mg/dL (ref 8–23)
CO2: 24 mmol/L (ref 22–32)
Calcium: 7.5 mg/dL — ABNORMAL LOW (ref 8.9–10.3)
Chloride: 98 mmol/L (ref 98–111)
Creatinine, Ser: 0.81 mg/dL (ref 0.44–1.00)
GFR calc Af Amer: >60 mL/min (ref >60)
GFR calc non Af Amer: >60 mL/min (ref >60)
Glucose, Bld: 87 mg/dL (ref 70–99)
Potassium: 3.1 mmol/L — ABNORMAL LOW (ref 3.5–5.1)
Sodium: 133 mmol/L — ABNORMAL LOW (ref 135–145)
Total Bilirubin: 0.7 mg/dL (ref 0.3–1.2)
Total Protein: 5.1 g/dL — ABNORMAL LOW (ref 6.5–8.1)

## 2019-06-18 LAB — CBC WITH DIFFERENTIAL/PLATELET
Abs Immature Granulocytes: 0.1 10*3/uL — ABNORMAL HIGH (ref 0.00–0.07)
Basophils Absolute: 0 10*3/uL (ref 0.0–0.1)
Basophils Relative: 0 %
Eosinophils Absolute: 0 10*3/uL (ref 0.0–0.5)
Eosinophils Relative: 1 %
HCT: 22.9 % — ABNORMAL LOW (ref 36.0–46.0)
Hemoglobin: 7.5 g/dL — ABNORMAL LOW (ref 12.0–15.0)
Immature Granulocytes: 4 %
Lymphocytes Relative: 19 %
Lymphs Abs: 0.5 10*3/uL — ABNORMAL LOW (ref 0.7–4.0)
MCH: 31.5 pg (ref 26.0–34.0)
MCHC: 32.8 g/dL (ref 30.0–36.0)
MCV: 96.2 fL (ref 80.0–100.0)
Monocytes Absolute: 0.3 10*3/uL (ref 0.1–1.0)
Monocytes Relative: 10 %
Neutro Abs: 1.7 10*3/uL (ref 1.7–7.7)
Neutrophils Relative %: 66 %
Platelets: 85 10*3/uL — ABNORMAL LOW (ref 150–400)
RBC: 2.38 MIL/uL — ABNORMAL LOW (ref 3.87–5.11)
RDW: 15.6 % — ABNORMAL HIGH (ref 11.5–15.5)
WBC: 2.6 10*3/uL — ABNORMAL LOW (ref 4.0–10.5)
nRBC: 0 % (ref 0.0–0.2)

## 2019-06-18 LAB — PREPARE RBC (CROSSMATCH)

## 2019-06-18 LAB — SARS CORONAVIRUS 2 BY RT PCR (HOSPITAL ORDER, PERFORMED IN ~~LOC~~ HOSPITAL LAB): SARS Coronavirus 2: NEGATIVE

## 2019-06-18 LAB — LIPASE, BLOOD: Lipase: 18 U/L (ref 11–51)

## 2019-06-18 MED ORDER — HYDROCODONE-ACETAMINOPHEN 5-325 MG PO TABS: 1.0000 | ORAL_TABLET | Freq: Four times a day (QID) | ORAL | Status: DC | PRN

## 2019-06-18 MED ORDER — ACETAMINOPHEN 650 MG RE SUPP: 650.0000 mg | Freq: Four times a day (QID) | RECTAL | Status: DC | PRN

## 2019-06-18 MED ORDER — ONDANSETRON HCL 4 MG/2ML IJ SOLN
4.0000 mg | Freq: Once | INTRAMUSCULAR | Status: AC
  Administered 2019-06-18: 4 mg via INTRAVENOUS
  Filled 2019-06-18: qty 2

## 2019-06-18 MED ORDER — CITALOPRAM HYDROBROMIDE 20 MG PO TABS
40.0000 mg | ORAL_TABLET | Freq: Every day | ORAL | Status: DC
  Administered 2019-06-18 – 2019-06-20 (×3): 40 mg via ORAL
  Filled 2019-06-18 (×3): qty 2

## 2019-06-18 MED ORDER — DIPHENHYDRAMINE HCL 25 MG PO CAPS
25.0000 mg | ORAL_CAPSULE | Freq: Every evening | ORAL | Status: DC | PRN
  Filled 2019-06-18: qty 1

## 2019-06-18 MED ORDER — SODIUM CHLORIDE 0.9 % IV SOLN
INTRAVENOUS | Status: DC
  Administered 2019-06-18 – 2019-06-19 (×2): via INTRAVENOUS

## 2019-06-18 MED ORDER — ONDANSETRON HCL 4 MG/2ML IJ SOLN
4.0000 mg | Freq: Four times a day (QID) | INTRAMUSCULAR | Status: DC | PRN
  Filled 2019-06-18: qty 2

## 2019-06-18 MED ORDER — ONDANSETRON HCL 4 MG PO TABS: 4.0000 mg | ORAL_TABLET | Freq: Four times a day (QID) | ORAL | Status: DC | PRN

## 2019-06-18 MED ORDER — ACETAMINOPHEN 325 MG PO TABS: 650.0000 mg | ORAL_TABLET | Freq: Four times a day (QID) | ORAL | Status: DC | PRN

## 2019-06-18 MED ORDER — LOSARTAN POTASSIUM 50 MG PO TABS
100.0000 mg | ORAL_TABLET | Freq: Every day | ORAL | Status: DC
  Filled 2019-06-18 (×3): qty 2

## 2019-06-18 MED ORDER — LEVOTHYROXINE SODIUM 112 MCG PO TABS
224.0000 ug | ORAL_TABLET | Freq: Every day | ORAL | Status: DC
  Administered 2019-06-19 – 2019-06-20 (×2): 224 ug via ORAL
  Filled 2019-06-18 (×2): qty 2

## 2019-06-18 MED ORDER — POTASSIUM CHLORIDE CRYS ER 20 MEQ PO TBCR
40.0000 meq | EXTENDED_RELEASE_TABLET | Freq: Three times a day (TID) | ORAL | Status: AC
  Administered 2019-06-18 (×2): 40 meq via ORAL
  Filled 2019-06-18 (×2): qty 2

## 2019-06-18 MED ORDER — DILTIAZEM HCL ER COATED BEADS 180 MG PO CP24
180.0000 mg | ORAL_CAPSULE | Freq: Every day | ORAL | Status: DC
  Administered 2019-06-18 – 2019-06-20 (×3): 180 mg via ORAL
  Filled 2019-06-18 (×4): qty 1

## 2019-06-18 MED ORDER — SODIUM CHLORIDE 0.9% FLUSH
3.0000 mL | Freq: Two times a day (BID) | INTRAVENOUS | Status: DC
  Administered 2019-06-18: 3 mL via INTRAVENOUS

## 2019-06-18 MED ORDER — SODIUM CHLORIDE 0.9 % IV BOLUS
1000.0000 mL | Freq: Once | INTRAVENOUS | Status: AC
  Administered 2019-06-18: 1000 mL via INTRAVENOUS

## 2019-06-18 MED ORDER — SODIUM CHLORIDE 0.9 % IV SOLN
10.0000 mL/h | Freq: Once | INTRAVENOUS | Status: AC
  Administered 2019-06-18: 10 mL/h via INTRAVENOUS

## 2019-06-18 NOTE — H&P (Signed)
History and Physical    Tracy Mclaughlin BOF:[id number] DOB: 04-19-1934 DOA: 06/18/2019  PCP: [facility], [place]  Patient coming from: Home  I have personally briefly reviewed patient's old medical records in [place]  Chief Complaint: Weakness/nausea/vomiting  HPI: Tracy Mclaughlin is a 85 y.o. female with medical history significant of non-Hodgkin's lymphoma, atrial fibrillation, hypertension, anemia and several other comorbidities as below presented to ER with a complaint of generalized weakness, nausea and vomiting.  According to patient, she has been having nausea and intermittent vomiting since she has started chemotherapy however starting yesterday, she has felt significantly weakness that started suddenly.  She does not have any other complaints such as cough, chest pain, shortness of breath, fever, chills, sweating, abdominal pain, any problem with urination however she also has diarrhea since about 3 to 4 days and she tells me that she always has diarrhea after she gets oral contrast which she did receive 3 days ago when she had abdominal CT scan done as an outpatient by her oncologist.  ED Course: Upon arrival to the ED, she was fairly hemodynamically stable.  CBC revealed pancytopenia with white cells of 2.6 hemoglobin of 7.5 and platelets of 85.  1 unit of PRBC transfusion was ordered through the ER and hospital service was consulted for admission.  She was tested negative for COVID-19.  Review of Systems: As per HPI otherwise 10 point review of systems negative.    Past Medical History:  Diagnosis Date  . Anemia   . Arthritis   . Atrial fibrillation ([place])   . Cancer ([place])    Thyroid/Colon/ B cell Lymphoma-remission  . Diverticulosis   . History of atrial fibrillation    Was in only in a- fib for 2 days  . Hypertension   . Non Hodgkin's lymphoma ([place])    Non-Hodgkin's B-Cell Lymphoma  . Papillary thyroid carcinoma ([place])   . Paroxysmal atrial  fibrillation ([facility])   . Post-surgical hypothyroidism   . Pulmonary embolus ([place]) 11/2011  . Subarachnoid hemorrhage ([place]) 1996  . Thyroid disease     Past Surgical History:  Procedure Laterality Date  . APPENDECTOMY  1959  . CHOLECYSTECTOMY  1999   S/P Lap Cholecystectomy  . COLON RESECTION  11/12   mucinous adenocarcinoma of the cecum, resected  . COLONOSCOPY  10/05/2011  . COLONOSCOPY N/A 04/17/2014   Procedure: COLONOSCOPY;  Surgeon: Gatha Mayer, MD;  [facility]: [facility];  Service: Endoscopy;  Laterality: N/A;  . ESOPHAGOGASTRODUODENOSCOPY N/A 04/17/2014   Procedure: ESOPHAGOGASTRODUODENOSCOPY (EGD);  Surgeon: Gatha Mayer, MD;  Location: [facility];  Service: Endoscopy;  Laterality: N/A;  . KNEE ARTHROSCOPY Left 1998   S/P  Arthroscopic Left knee surgery  . LYMPH NODE DISSECTION Left 05/04/2019   Procedure: EXCISION DEEP LEFT AXILLARY LYMPH NODES;  Surgeon: Fanny Skates, MD;  [facility]: [facility];  Service: General;  Laterality: Left;  . PORTACATH PLACEMENT N/A 05/15/2019   Procedure: INSERTION PORT-A-CATH WITH ULTRASOUND;  Surgeon: Fanny Skates, MD;  Location: [facility];  Service: General;  Laterality: N/A;  . ROTATOR CUFF REPAIR Right 1998  . SUBDURAL HEMATOMA EVACUATION VIA CRANIOTOMY    . THYROIDECTOMY  05/23/2012   Procedure: THYROIDECTOMY;  Surgeon: [name], MD;  Location: [facility];  Service: General;  Laterality: N/A;  Total thyroidectomy, central compartment lymph node biopsy times two, reimplantation left superior parathyroid gland.  . TUBOPLASTY / TUBOTUBAL ANASTOMOSIS       reports that she quit smoking about  49 years ago. Her smoking use included cigarettes. She has a 12.00 pack-year smoking history. She has never used smokeless tobacco. She reports previous alcohol use. She reports that she does not use drugs.  Allergies  Allergen Reactions  . Demerol [Meperidine Hcl] Nausea And Vomiting    Family History  Problem Relation Age of Onset  . Kidney  cancer Sister        ?  Marland Kitchen Hypertension Sister   . Lung cancer Brother   . Hypertension Mother   . Diabetes Mother   . Coronary artery disease Mother   . Diabetes Sister        x 2  . Stroke Sister   . Anesthesia problems Neg Hx   . Heart attack Neg Hx     Prior to Admission medications   Medication Sig Start Date End Date Taking? Authorizing Provider  citalopram (CELEXA) 40 MG tablet Take 40 mg by mouth daily.   Yes [provider]  diltiazem (DILACOR XR) 180 MG 24 hr capsule Take 1 capsule (180 mg total) by mouth daily. 12/24/14  Yes Belva Crome, MD  diphenhydrAMINE (BENADRYL) 25 MG tablet Take 25 mg by mouth at bedtime as needed. For itching/sleep   Yes [provider]  levothyroxine (SYNTHROID, LEVOTHROID) 112 MCG tablet Take 224 mcg by mouth daily before breakfast. Pt takes two 112 mcg tablets to equal 224 mcg once a day   Yes [provider]  losartan (COZAAR) 100 MG tablet Take 100 mg by mouth daily.   Yes [provider]  ondansetron (ZOFRAN) 8 MG tablet Take 1 tablet (8 mg total) by mouth every 8 (eight) hours as needed for nausea or vomiting. 05/10/19  Yes Owens Shark, NP  HYDROcodone-acetaminophen (NORCO) 5-325 MG tablet Take 1-2 tablets by mouth every 6 (six) hours as needed for moderate pain or severe pain. Patient not taking: Reported on 06/08/2019 05/04/19   Fanny Skates, MD  predniSONE (DELTASONE) 20 MG tablet Take 60 mg daily for 5 days beginning day after chemotherapy Patient not taking: Reported on 06/18/2019 06/08/19   Owens Shark, NP    Physical Exam: Vitals:   06/18/19 1330 06/18/19 1337 06/18/19 1400 06/18/19 1430  BP: (!) 114/46  (!) 125/49 125/60  Pulse:  (!) 109 (!) 52 63  Resp: 15 18 15    Temp:      TempSrc:      SpO2:  97% 100% 99%    Constitutional: NAD, calm, comfortable Vitals:   06/18/19 1330 06/18/19 1337 06/18/19 1400 06/18/19 1430  BP: (!) 114/46  (!) 125/49 125/60  Pulse:  (!) 109 (!) 52 63  Resp:  15 18 15    Temp:      TempSrc:      SpO2:  97% 100% 99%   Eyes: PERRL, lids and conjunctivae normal, morbidly obese ENMT: Mucous membranes are moist. Posterior pharynx clear of any exudate or lesions.Normal dentition.  Neck: normal, supple, no masses, no thyromegaly Respiratory: clear to auscultation bilaterally, no wheezing, no crackles. Normal respiratory effort. No accessory muscle use.  Cardiovascular: Regular rate and rhythm, no murmurs / rubs / gallops. No extremity edema. 2+ pedal pulses. No carotid bruits.  Abdomen: no tenderness, no masses palpated. No hepatosplenomegaly. Bowel sounds positive.  Musculoskeletal: no clubbing / cyanosis. No joint deformity upper and lower extremities. Good ROM, no contractures. Normal muscle tone.  Skin: no rashes, lesions, ulcers. No induration Neurologic: CN 2-12 grossly intact. Sensation intact, DTR normal. Strength 5/5 in  all 4.  Psychiatric: Normal judgment and insight. Alert and oriented x 3. Normal mood.    Labs on Admission: I have personally reviewed following labs and imaging studies  CBC: Recent Labs  Lab 06/15/19 1203 06/18/19 1235  WBC 1.4* 2.6*  NEUTROABS 1.0* 1.7  HGB 8.2* 7.5*  HCT 25.2* 22.9*  MCV 96.2 96.2  PLT 121* 85*   Basic Metabolic Panel: Recent Labs  Lab 06/15/19 1203 06/18/19 1235  NA 137 133*  K 4.3 3.1*  CL 104 98  CO2 26 24  GLUCOSE 85 87  BUN 21 14  CREATININE 0.76 0.81  CALCIUM 7.9* 7.5*   GFR: Estimated Creatinine Clearance: 60.4 mL/min (by C-G formula based on SCr of 0.81 mg/dL). Liver Function Tests: Recent Labs  Lab 06/15/19 1203 06/18/19 1235  AST 55* 18  ALT 82* 38  ALKPHOS 87 59  BILITOT 1.1 0.7  PROT 5.2* 5.1*  ALBUMIN 3.1* 2.8*   Recent Labs  Lab 06/18/19 1235  LIPASE 18   No results for input(s): AMMONIA in the last 168 hours. Coagulation Profile: No results for input(s): INR, PROTIME in the last 168 hours. Cardiac Enzymes: No results for input(s): CKTOTAL, CKMB,  CKMBINDEX, TROPONINI in the last 168 hours. BNP (last 3 results) No results for input(s): PROBNP in the last 8760 hours. HbA1C: No results for input(s): HGBA1C in the last 72 hours. CBG: No results for input(s): GLUCAP in the last 168 hours. Lipid Profile: No results for input(s): CHOL, HDL, LDLCALC, TRIG, CHOLHDL, LDLDIRECT in the last 72 hours. Thyroid Function Tests: No results for input(s): TSH, T4TOTAL, FREET4, T3FREE, THYROIDAB in the last 72 hours. Anemia Panel: No results for input(s): VITAMINB12, FOLATE, FERRITIN, TIBC, IRON, RETICCTPCT in the last 72 hours. 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 Normal mediastinum and cardiac silhouette. Normal pulmonary vasculature. No evidence of effusion, infiltrate, or pneumothorax. No acute bony abnormality. LEFT axillary and nodal dissection clips IMPRESSION: No acute cardiopulmonary process. Electronically Signed   By: Suzy Bouchard M.D.   On: 06/18/2019 12:26   Dg Abd Portable 1 View  Result Date: 06/18/2019 CLINICAL DATA:  Nausea and vomiting EXAM: PORTABLE ABDOMEN - 1 VIEW COMPARISON:  None. FINDINGS: The bowel gas pattern is normal. No radio-opaque  calculi or other significant radiographic abnormality are seen. IMPRESSION: Negative. Electronically Signed   By: Kathreen Devoid   On: 06/18/2019 12:15     Assessment/Plan Active Problems:   Non Hodgkin's lymphoma ([place])   Hypertension   Anemia   Pancytopenia ([place])   Pancytopenia in a patient with non-Hodgkin's lymphoma: Currently receiving  [facility] has received 2 sessions so far.  No signs of bleeding.  Hemoglobin 7.5.  She is going  to get 1 unit of PRBC transfusion.  No signs of infection.  We will repeat her H&H after transfusion and in the morning.  Discussed with patient's primary oncologist.  This is likely due to chemotherapy that she has a started and she has received 2 sessions so far.  They will see patient in the morning.  Generalized weakness: Likely due to anemia and dehydration.  Hopefully this will get better after transfusion.  She is already feeling better after receiving 1 L of fluid bolus in the ER.  Diarrhea: Likely due to oral contrast.  I do not suspect C. difficile.  We will not check that.  Hydrate and monitor.  Nausea/vomiting: According to patient, this has been going on intermittently since she started chemotherapy.  Symptomatic treatment.  Paroxysmal atrial fibrillation: Early in sinus rhythm.  Not on any anticoagulation.  Resume diltiazem and monitor on telemetry.  Hypertension: We will resume losartan.  Hypokalemia: 3.1.  Replace orally and recheck in the morning.  DVT prophylaxis: SCD/avoiding heparin products due to thrombocytopenia and anemia Code Status: DNR Family Communication: None present at bedside.  Patient alert, oriented and competent.  She is retired Marine scientist.  We discussed plan of admission in detail. Disposition Plan: Likely discharge home in next 24 to 48 hours. Consults called: Consulted oncology and discussed case with Dr. Annitta Jersey (patient's primary oncologist) who will see patient tomorrow. Admission status: Observation   Darliss Cheney MD  [facility] Pager [phone]  If 7PM-7AM, please contact night-coverage [email or web address] Password [facility]  06/18/2019, 2:54 PM

## 2019-06-18 NOTE — Telephone Encounter (Signed)
Husband called with concern that his wife is not getting better since last chemo treatment. Several days ago had fever of 100.5 with shaking chills. Temp/chills resolved after one dose of Tylenol. No fever since then, today's temp is 97.5. Continues to have nausea w/vomiting green bile-like liquid despite ondansetron. Not eating and barely drinks fluids and is concerned she is dehydrated. Has been in the bed up to potty chair past week and that is all the activity she has had.  Per Dr. Benay Spice: Come to office to be seen and get IV fluids or go to emergency room. He decided to take her to the [facility] emergency room. Notified ER charge nurse they were coming and should be there by 12:00

## 2019-06-18 NOTE — ED Provider Notes (Addendum)
Tracy Mclaughlin Provider Note   CSN: [id number] Arrival date & time: 06/18/19  1049     History   Chief Complaint Chief Complaint  Patient presents with  . Fatigue  . Emesis  . Diarrhea    HPI Tracy Mclaughlin is a 85 y.o. female.     HPI 85 year old female with a history of lymphoma presents the emergency department complaints of nausea and vomiting for the past 24 hours.  She feels overall weak and poor.  She has decreased energy.  She is actively receiving chemotherapy.  Some cough without shortness of breath.  No fevers or chills.  No urinary complaints.  Denies nausea vomiting.  She feels like she has been gradually becoming weaker but thinks significantly worsened last night.  No unilateral arm or leg weakness.   Past Medical History:  Diagnosis Date  . Anemia   . Arthritis   . Atrial fibrillation ([place])   . Cancer ([place])    Thyroid/Colon/ B cell Lymphoma-remission  . Diverticulosis   . History of atrial fibrillation    Was in only in a- fib for 2 days  . Hypertension   . Non Hodgkin's lymphoma ([place])    Non-Hodgkin's B-Cell Lymphoma  . Papillary thyroid carcinoma ([place])   . Paroxysmal atrial fibrillation ([facility])   . Post-surgical hypothyroidism   . Pulmonary embolus ([place]) 11/2011  . Subarachnoid hemorrhage ([place]) 1996  . Thyroid disease     Patient Active Problem List   Diagnosis Date Noted  . Port-A-Cath in place 05/18/2019  . Goals of care, counseling/discussion 03/21/2019  . Hypersensitivity reaction 09/29/2016  . PAF (paroxysmal atrial fibrillation) ([place]) 07/07/2016  . Personal history of colonic polyps - cancer and adenoma 04/17/2014  . Anemia, iron deficiency 04/02/2014  . Heme + stool 04/02/2014  . Personal history of colon cancer 04/02/2014  . Primary thyroid cancer ([place]) 05/01/2012  . Colon cancer ([place]) 11/24/2011  . Pulmonary embolism ([place]) 11/24/2011  . Non Hodgkin's lymphoma ([place])   . Subarachnoid hemorrhage  ([place])   . Arthritis   . Hypertension     Past Surgical History:  Procedure Laterality Date  . APPENDECTOMY  1959  . CHOLECYSTECTOMY  1999   S/P Lap Cholecystectomy  . COLON RESECTION  11/12   mucinous adenocarcinoma of the cecum, resected  . COLONOSCOPY  10/05/2011  . COLONOSCOPY N/A 04/17/2014   Procedure: COLONOSCOPY;  Surgeon: Gatha Mayer, MD;  [facility];  Service: Endoscopy;  Laterality: N/A;  . ESOPHAGOGASTRODUODENOSCOPY N/A 04/17/2014   Procedure: ESOPHAGOGASTRODUODENOSCOPY (EGD);  Surgeon: Gatha Mayer, MD;  Location: [facility];  Service: Endoscopy;  Laterality: N/A;  . KNEE ARTHROSCOPY Left 1998   S/P  Arthroscopic Left knee surgery  . LYMPH NODE DISSECTION Left 05/04/2019   Procedure: EXCISION DEEP LEFT AXILLARY LYMPH NODES;  Surgeon: Fanny Skates, MD;  [facility];  Service: General;  Laterality: Left;  . PORTACATH PLACEMENT N/A 05/15/2019   Procedure: INSERTION PORT-A-CATH WITH ULTRASOUND;  Surgeon: Fanny Skates, MD;  [facility];  Service: General;  Laterality: N/A;  . ROTATOR CUFF REPAIR Right 1998  . SUBDURAL HEMATOMA EVACUATION VIA CRANIOTOMY    . THYROIDECTOMY  05/23/2012   Procedure: THYROIDECTOMY;  Surgeon: [name], MD;  Location: [facility];  Service: General;  Laterality: N/A;  Total thyroidectomy, central compartment lymph node biopsy times two, reimplantation left superior parathyroid gland.  . TUBOPLASTY / TUBOTUBAL ANASTOMOSIS       OB History   No  obstetric history on file.      Home Medications    Prior to Admission medications   Medication Sig Start Date End Date Taking? Authorizing Provider  citalopram (CELEXA) 40 MG tablet Take 40 mg by mouth daily.    [provider]  diltiazem (DILACOR XR) 180 MG 24 hr capsule Take 1 capsule (180 mg total) by mouth daily. 12/24/14   Belva Crome, MD  diphenhydrAMINE (BENADRYL) 25 MG tablet Take 25 mg by mouth at bedtime as needed. For itching/sleep    [provider]  HYDROcodone-acetaminophen (NORCO) 5-325 MG tablet Take 1-2 tablets by mouth every 6 (six) hours as needed for moderate pain or severe pain. Patient not taking: Reported on 06/08/2019 05/04/19   Fanny Skates, MD  levothyroxine (SYNTHROID, LEVOTHROID) 112 MCG tablet Take 224 mcg by mouth daily before breakfast. Pt takes two 112 mcg tablets to equal 224 mcg once a day    [provider]  losartan (COZAAR) 100 MG tablet Take 100 mg by mouth daily.    [provider]  ondansetron (ZOFRAN) 8 MG tablet Take 1 tablet (8 mg total) by mouth every 8 (eight) hours as needed for nausea or vomiting. 05/10/19   Owens Shark, NP  predniSONE (DELTASONE) 20 MG tablet Take 60 mg daily for 5 days beginning day after chemotherapy 06/08/19   Owens Shark, NP  traMADol (ULTRAM) 50 MG tablet Take 50 mg by mouth every 6 (six) hours as needed for moderate pain.    [provider]    Family History Family History  Problem Relation Age of Onset  . Kidney cancer Sister        ?  Marland Kitchen Hypertension Sister   . Lung cancer Brother   . Hypertension Mother   . Diabetes Mother   . Coronary artery disease Mother   . Diabetes Sister        x 2  . Stroke Sister   . Anesthesia problems Neg Hx   . Heart attack Neg Hx     Social History Social History   Tobacco Use  . Smoking status: Former Smoker    Packs/day: 1.00    Years: 12.00    Pack years: 12.00    Types: Cigarettes    Quit date: 08/23/1969    Years since quitting: 49.8  . Smokeless tobacco: Never Used  Substance Use Topics  . Alcohol use: Not Currently    Comment: 1 manhattens per night- Last had 3 months ago  . Drug use: No     Allergies   Demerol [meperidine hcl]   Review of Systems Review of Systems  All other systems reviewed and are negative.    Physical Exam Updated Vital Signs BP (!) 110/49 (BP Location: Left Arm)   Pulse 62   Temp 97.9 F (36.6 C) (Oral)   Resp 17   SpO2 99%   Physical  Exam Vitals signs and nursing note reviewed.  Constitutional:      General: She is not in acute distress.    Appearance: She is well-developed.  HENT:     Head: Normocephalic and atraumatic.  Neck:     Musculoskeletal: Normal range of motion.  Cardiovascular:     Rate and Rhythm: Normal rate and regular rhythm.     Heart sounds: Normal heart sounds.  Pulmonary:     Effort: Pulmonary effort is normal.     Breath sounds: Normal breath sounds.  Abdominal:     General: There  is no distension.     Palpations: Abdomen is soft.     Tenderness: There is no abdominal tenderness.  Genitourinary:    Comments: Light brown stool, no gross blood. Hemoccult + Musculoskeletal: Normal range of motion.  Skin:    General: Skin is warm and dry.  Neurological:     Mental Status: She is alert and oriented to person, place, and time.  Psychiatric:        Judgment: Judgment normal.      ED Treatments / Results  Labs (all labs ordered are listed, but only abnormal results are displayed) Labs Reviewed  CBC WITH DIFFERENTIAL/PLATELET - Abnormal; Notable for the following components:      Result Value   WBC 2.6 (*)    RBC 2.38 (*)    Hemoglobin 7.5 (*)    HCT 22.9 (*)    RDW 15.6 (*)    Platelets 85 (*)    Lymphs Abs 0.5 (*)    Abs Immature Granulocytes 0.10 (*)    All other components within normal limits  COMPREHENSIVE METABOLIC PANEL - Abnormal; Notable for the following components:   Sodium 133 (*)    Potassium 3.1 (*)    Calcium 7.5 (*)    Total Protein 5.1 (*)    Albumin 2.8 (*)    All other components within normal limits  SARS CORONAVIRUS 2 (HOSPITAL ORDER, [place] [facility])  LIPASE, BLOOD  POC OCCULT BLOOD, ED  TYPE AND SCREEN  PREPARE RBC (CROSSMATCH)    EKG None  Radiology Dg Chest Portable 1 View  Result Date: 06/18/2019 CLINICAL DATA:  85 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 Normal mediastinum and cardiac silhouette. Normal pulmonary vasculature. No evidence of effusion, infiltrate, or pneumothorax. No acute bony abnormality. LEFT axillary and nodal dissection clips IMPRESSION: No acute cardiopulmonary process. Electronically Signed   By: Suzy Bouchard M.D.   On: 06/18/2019 12:26   Dg Abd Portable 1 View  Result Date: 06/18/2019 CLINICAL DATA:  Nausea and vomiting EXAM: PORTABLE ABDOMEN - 1 VIEW COMPARISON:  None. FINDINGS: The bowel gas pattern is normal. No radio-opaque calculi or other significant radiographic abnormality are seen. IMPRESSION: Negative. Electronically Signed   By: Kathreen Devoid   On: 06/18/2019 12:15    Procedures .Critical Care Performed by: Jola Schmidt, MD Authorized by: Jola Schmidt, MD   Critical care provider statement:    Critical care time (minutes):  35   Critical care was time spent personally by me on the following activities:  Discussions with consultants, evaluation of patient's response to treatment, examination of patient, ordering and performing treatments and interventions, ordering and review of laboratory studies, ordering and review of radiographic studies, pulse oximetry, re-evaluation of patient's condition, obtaining history from patient or surrogate and review of old charts   (including critical care time)  Medications Ordered in ED Medications  0.9 %  sodium chloride infusion (has no administration in time range)  sodium chloride 0.9 % bolus 1,000 mL (1,000 mLs Intravenous New Bag/Given 06/18/19 1238)  ondansetron (ZOFRAN) injection 4 mg (4 mg Intravenous Given 06/18/19 1236)     Initial Impression / Assessment and Plan / ED Course  I have reviewed the triage vital signs and the nursing notes.  Pertinent labs & imaging results that were available during  my care of the patient were  reviewed by me and considered in my medical decision making (see chart for details).        Nausea vomiting diarrhea. Dehydration. New pancytopenia and 3 gram drop in her hemoglobin in the past 10 days. Will admit. Will likely benefit from blood transfusion. hospitalist admission. Pt and family updated  Final Clinical Impressions(s) / ED Diagnoses   Final diagnoses:  None    ED Discharge Orders    None       Jola Schmidt, MD 06/18/19 1413    Jola Schmidt, MD 06/18/19 [place], MD 06/18/19 1421

## 2019-06-18 NOTE — ED Notes (Signed)
Per Dr Gearldine Shown office-states last chemo was 6/19-states was running a fever last week-controlled by Tylenol-states not eating, drinking, lethargy-sending to ED for hydration, evaluation

## 2019-06-18 NOTE — ED Triage Notes (Signed)
Pt takes chemo (splenic lymphoma) and reports for a couple weeks been fatigued, vomiting and diarrhea

## 2019-06-18 NOTE — Progress Notes (Signed)
ED TO INPATIENT HANDOFF REPORT  Name/Age/Gender Tracy Mclaughlin 85 y.o. female  Code Status Code Status History    Date Active Date Inactive Code Status Order ID Comments User Context   05/29/2014 1308 05/30/2014 0338 Full Code [id number]  Jacqulynn Cadet, [place]   05/23/2012 1406 05/24/2012 1327 Full Code [id number]  Courtney Heys, RN Inpatient   11/24/2011 2036 11/26/2011 1529 Full Code [id number]  Bronson Curb, RN ED   Advance Care Planning Activity    Advance Directive Documentation     Most Recent Value  Type of Advance Directive  Healthcare Power of Attorney, Living will  Pre-existing out of facility DNR order (yellow form or pink MOST form)  -  "MOST" Form in Place?  -      Home/SNF/Other Home  Chief Complaint dehydration  Level of Care/Admitting Diagnosis ED Disposition    ED Disposition Condition [place]: [facility] [100102]  Level of Care: Med-Surg [16]  Covid Evaluation: Confirmed COVID Negative  Diagnosis: Anemia [622297]  Admitting Physician: Darliss Cheney [[id number]]  Attending Physician: Darliss Cheney [[id number]]  PT Class (Do Not Modify): Observation [104]  PT Acc Code (Do Not Modify): Observation [10022]       Medical History Past Medical History:  Diagnosis Date  . Anemia   . Arthritis   . Atrial fibrillation ([place])   . Cancer ([place])    Thyroid/Colon/ B cell Lymphoma-remission  . Diverticulosis   . History of atrial fibrillation    Was in only in a- fib for 2 days  . Hypertension   . Non Hodgkin's lymphoma ([place])    Non-Hodgkin's B-Cell Lymphoma  . Papillary thyroid carcinoma ([place])   . Paroxysmal atrial fibrillation ([facility])   . Post-surgical hypothyroidism   . Pulmonary embolus ([place]) 11/2011  . Subarachnoid hemorrhage ([place]) 1996  . Thyroid disease     Allergies Allergies  Allergen Reactions  . Demerol [Meperidine Hcl] Nausea And Vomiting    IV Location/Drains/Wounds Patient Lines/Drains/Airways  Status   Active Line/Drains/Airways    Name:   Placement date:   Placement time:   Site:   Days:   Implanted Port 05/17/19   05/17/19    1133    -   32   Peripheral IV 04/02/19 Right Antecubital   04/02/19    1059    Antecubital   77   Incision (Closed) 05/04/19 Arm Left   05/04/19    1235     45   Incision (Closed) 05/04/19 Axilla Left   05/04/19    1321     45   Incision (Closed) 05/15/19 Neck Right   05/15/19    1254     34   Incision (Closed) 05/15/19 Chest Right   05/15/19    1307     34          Labs/Imaging Results for orders placed or performed during the hospital encounter of 06/18/19 (from the past 48 hour(s))  CBC with Differential/Platelet     Status: Abnormal   Collection Time: 06/18/19 12:35 PM  Result Value Ref Range   WBC 2.6 (L) 4.0 - 10.5 K/uL   RBC 2.38 (L) 3.87 - 5.11 MIL/uL   Hemoglobin 7.5 (L) 12.0 - 15.0 g/dL   HCT 22.9 (L) 36.0 - 46.0 %   MCV 96.2 80.0 - 100.0 fL   MCH 31.5 26.0 - 34.0 pg   MCHC 32.8 30.0 - 36.0 g/dL   RDW 15.6 (  H) 11.5 - 15.5 %   Platelets 85 (L) 150 - 400 K/uL    Comment: Immature Platelet Fraction may be clinically indicated, consider ordering this additional test [id number]    nRBC 0.0 0.0 - 0.2 %   Neutrophils Relative % 66 %   Neutro Abs 1.7 1.7 - 7.7 K/uL   Lymphocytes Relative 19 %   Lymphs Abs 0.5 (L) 0.7 - 4.0 K/uL   Monocytes Relative 10 %   Monocytes Absolute 0.3 0.1 - 1.0 K/uL   Eosinophils Relative 1 %   Eosinophils Absolute 0.0 0.0 - 0.5 K/uL   Basophils Relative 0 %   Basophils Absolute 0.0 0.0 - 0.1 K/uL   Immature Granulocytes 4 %   Abs Immature Granulocytes 0.10 (H) 0.00 - 0.07 K/uL    Comment: Performed at [facility], [place] [address], [place], [place] [address]  Comprehensive metabolic panel     Status: Abnormal   Collection Time: 06/18/19 12:35 PM  Result Value Ref Range   Sodium 133 (L) 135 - 145 mmol/L   Potassium 3.1 (L) 3.5 - 5.1 mmol/L   Chloride 98 98 - 111 mmol/L   CO2 24 22 -  32 mmol/L   Glucose, Bld 87 70 - 99 mg/dL   BUN 14 8 - 23 mg/dL   Creatinine, Ser 0.81 0.44 - 1.00 mg/dL   Calcium 7.5 (L) 8.9 - 10.3 mg/dL   Total Protein 5.1 (L) 6.5 - 8.1 g/dL   Albumin 2.8 (L) 3.5 - 5.0 g/dL   AST 18 15 - 41 U/L   ALT 38 0 - 44 U/L   Alkaline Phosphatase 59 38 - 126 U/L   Total Bilirubin 0.7 0.3 - 1.2 mg/dL   GFR calc non Af Amer >60 >60 mL/min   GFR calc Af Amer >60 >60 mL/min   Anion gap 11 5 - 15    Comment: Performed at [facility], [place] [address], [place], [place] [address]  Lipase, blood     Status: None   Collection Time: 06/18/19 12:35 PM  Result Value Ref Range   Lipase 18 11 - 51 U/L    Comment: Performed at [facility], [place] [address], [place], [place] [address]  SARS Coronavirus 2 (CEPHEID- Performed in [place] hospital lab), Hosp Order     Status: None   Collection Time: 06/18/19 12:35 PM   Specimen: Nasopharyngeal Swab  Result Value Ref Range   SARS Coronavirus 2 NEGATIVE NEGATIVE    Comment: (NOTE) If result is NEGATIVE SARS-CoV-2 target nucleic acids are NOT DETECTED. The SARS-CoV-2 RNA is generally detectable in upper and lower  respiratory specimens during the acute phase of infection. The lowest  concentration of SARS-CoV-2 viral copies this assay can detect is 250  copies / mL. A negative result does not preclude SARS-CoV-2 infection  and should not be used as the sole basis for treatment or other  patient management decisions.  A negative result may occur with  improper specimen collection / handling, submission of specimen other  than nasopharyngeal swab, presence of viral mutation(s) within the  areas targeted by this assay, and inadequate number of viral copies  (<250 copies / mL). A negative result must be combined with clinical  observations, patient history, and epidemiological information. If result is POSITIVE SARS-CoV-2 target nucleic acids are DETECTED. The SARS-CoV-2 RNA is  generally detectable in upper and lower  respiratory specimens dur ing the acute phase of infection.  Positive  results are indicative of active infection with SARS-CoV-2.  Clinical  correlation with patient history and other diagnostic information is  necessary to determine patient infection status.  Positive results do  not rule out bacterial infection or co-infection with other viruses. If result is PRESUMPTIVE POSTIVE SARS-CoV-2 nucleic acids MAY BE PRESENT.   A presumptive positive result was obtained on the submitted specimen  and confirmed on repeat testing.  While 2019 novel coronavirus  (SARS-CoV-2) nucleic acids may be present in the submitted sample  additional confirmatory testing may be necessary for epidemiological  and / or clinical management purposes  to differentiate between  SARS-CoV-2 and other Sarbecovirus currently known to infect humans.  If clinically indicated additional testing with an alternate test  methodology [phone]) is advised. The SARS-CoV-2 RNA is generally  detectable in upper and lower respiratory sp ecimens during the acute  phase of infection. The expected result is Negative. Fact Sheet for Patients:  StrictlyIdeas.no Fact Sheet for Healthcare Providers: BankingDealers.co.za This test is not yet approved or cleared by the [place] FDA and has been authorized for detection and/or diagnosis of SARS-CoV-2 by FDA under an Emergency Use Authorization (EUA).  This EUA will remain in effect (meaning this test can be used) for the duration of the COVID-19 declaration under Section 564(b)(1) of the Act, 21 U.S.C. section 360bbb-3(b)(1), unless the authorization is terminated or revoked sooner. 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 Normal mediastinum and cardiac silhouette. Normal pulmonary vasculature. No evidence of effusion, infiltrate, or pneumothorax. No acute bony abnormality. LEFT axillary and nodal dissection clips IMPRESSION: No acute cardiopulmonary process. Electronically Signed   By: Suzy Bouchard M.D.   On: 06/18/2019 12:26   Dg Abd Portable 1 View  Result Date: 06/18/2019 CLINICAL DATA:  Nausea and vomiting EXAM: PORTABLE ABDOMEN - 1 VIEW COMPARISON:  None. FINDINGS: The bowel gas pattern is normal. No radio-opaque calculi or other significant radiographic abnormality are seen. IMPRESSION: Negative. Electronically Signed   By: Kathreen Devoid   On: 06/18/2019 12:15    Pending Labs Unresulted Labs (From admission, onward)    Start     Ordered   06/18/19 1421  Prepare RBC  (Adult Blood Administration - PRBC)  Once,   R    Question Answer Comment  # of Units 1 unit   Transfusion Indications Symptomatic Anemia   If emergent release call blood bank Not emergent release      06/18/19 1420   06/18/19 1418  ABO/Rh  Once,   R     06/18/19 1418   06/18/19 1343  Type and screen [place]  Once,   STAT    Comments: [place]    06/18/19 1342   Signed and Held  Comprehensive metabolic panel  Tomorrow morning,   R     Signed and Held   Signed and Held  CBC  Tomorrow morning,   R     Signed and Held          Vitals/Pain Today's Vitals   06/18/19 1337 06/18/19 1400 06/18/19 1430 06/18/19 1500  BP:  (!) 125/49 125/60 (!) 111/47  Pulse: (!) 109 (!) 52 63 (!) 58  Resp: 18 15    Temp:      TempSrc:      SpO2: 97% 100% 99% 98%  PainSc:        Isolation Precautions Airborne and Contact precautions  Medications Medications  0.9 %  sodium chloride infusion (has no administration  in time range)  sodium chloride 0.9 % bolus 1,000 mL (1,000 mLs Intravenous New Bag/Given 06/18/19 1238)  ondansetron (ZOFRAN) injection 4 mg (4 mg Intravenous Given 06/18/19 1236)    Mobility walks

## 2019-06-18 NOTE — ED Notes (Signed)
Bed: WA04 Expected date:  Expected time:  Means of arrival:  Comments: 

## 2019-06-19 DIAGNOSIS — Z79890 Hormone replacement therapy (postmenopausal): Secondary | ICD-10-CM | POA: Diagnosis not present

## 2019-06-19 DIAGNOSIS — Z20828 Contact with and (suspected) exposure to other viral communicable diseases: Secondary | ICD-10-CM | POA: Diagnosis present

## 2019-06-19 DIAGNOSIS — C8590 Non-Hodgkin lymphoma, unspecified, unspecified site: Secondary | ICD-10-CM | POA: Diagnosis not present

## 2019-06-19 DIAGNOSIS — D649 Anemia, unspecified: Secondary | ICD-10-CM

## 2019-06-19 DIAGNOSIS — Z66 Do not resuscitate: Secondary | ICD-10-CM | POA: Diagnosis present

## 2019-06-19 DIAGNOSIS — Z86711 Personal history of pulmonary embolism: Secondary | ICD-10-CM | POA: Diagnosis not present

## 2019-06-19 DIAGNOSIS — Z885 Allergy status to narcotic agent status: Secondary | ICD-10-CM | POA: Diagnosis not present

## 2019-06-19 DIAGNOSIS — D6959 Other secondary thrombocytopenia: Secondary | ICD-10-CM | POA: Diagnosis present

## 2019-06-19 DIAGNOSIS — I10 Essential (primary) hypertension: Secondary | ICD-10-CM | POA: Diagnosis present

## 2019-06-19 DIAGNOSIS — Z9049 Acquired absence of other specified parts of digestive tract: Secondary | ICD-10-CM | POA: Diagnosis not present

## 2019-06-19 DIAGNOSIS — E876 Hypokalemia: Secondary | ICD-10-CM | POA: Diagnosis present

## 2019-06-19 DIAGNOSIS — Z801 Family history of malignant neoplasm of trachea, bronchus and lung: Secondary | ICD-10-CM | POA: Diagnosis not present

## 2019-06-19 DIAGNOSIS — I480 Paroxysmal atrial fibrillation: Secondary | ICD-10-CM | POA: Diagnosis present

## 2019-06-19 DIAGNOSIS — C8514 Unspecified B-cell lymphoma, lymph nodes of axilla and upper limb: Secondary | ICD-10-CM | POA: Diagnosis not present

## 2019-06-19 DIAGNOSIS — T451X5A Adverse effect of antineoplastic and immunosuppressive drugs, initial encounter: Secondary | ICD-10-CM | POA: Diagnosis present

## 2019-06-19 DIAGNOSIS — Z85850 Personal history of malignant neoplasm of thyroid: Secondary | ICD-10-CM | POA: Diagnosis not present

## 2019-06-19 DIAGNOSIS — D61810 Antineoplastic chemotherapy induced pancytopenia: Secondary | ICD-10-CM | POA: Diagnosis present

## 2019-06-19 DIAGNOSIS — D61818 Other pancytopenia: Secondary | ICD-10-CM | POA: Diagnosis present

## 2019-06-19 DIAGNOSIS — Z87891 Personal history of nicotine dependence: Secondary | ICD-10-CM | POA: Diagnosis not present

## 2019-06-19 DIAGNOSIS — Z79891 Long term (current) use of opiate analgesic: Secondary | ICD-10-CM | POA: Diagnosis not present

## 2019-06-19 DIAGNOSIS — Z79899 Other long term (current) drug therapy: Secondary | ICD-10-CM | POA: Diagnosis not present

## 2019-06-19 DIAGNOSIS — Z85038 Personal history of other malignant neoplasm of large intestine: Secondary | ICD-10-CM | POA: Diagnosis not present

## 2019-06-19 DIAGNOSIS — Z833 Family history of diabetes mellitus: Secondary | ICD-10-CM | POA: Diagnosis not present

## 2019-06-19 DIAGNOSIS — Z8249 Family history of ischemic heart disease and other diseases of the circulatory system: Secondary | ICD-10-CM | POA: Diagnosis not present

## 2019-06-19 DIAGNOSIS — D509 Iron deficiency anemia, unspecified: Secondary | ICD-10-CM | POA: Diagnosis present

## 2019-06-19 DIAGNOSIS — E860 Dehydration: Secondary | ICD-10-CM | POA: Diagnosis present

## 2019-06-19 DIAGNOSIS — C8518 Unspecified B-cell lymphoma, lymph nodes of multiple sites: Secondary | ICD-10-CM | POA: Diagnosis present

## 2019-06-19 DIAGNOSIS — E890 Postprocedural hypothyroidism: Secondary | ICD-10-CM | POA: Diagnosis present

## 2019-06-19 LAB — CBC
HCT: 25.6 % — ABNORMAL LOW (ref 36.0–46.0)
Hemoglobin: 8.2 g/dL — ABNORMAL LOW (ref 12.0–15.0)
MCH: 30.7 pg (ref 26.0–34.0)
MCHC: 32 g/dL (ref 30.0–36.0)
MCV: 95.9 fL (ref 80.0–100.0)
Platelets: 86 10*3/uL — ABNORMAL LOW (ref 150–400)
RBC: 2.67 MIL/uL — ABNORMAL LOW (ref 3.87–5.11)
RDW: 16.9 % — ABNORMAL HIGH (ref 11.5–15.5)
WBC: 3.1 10*3/uL — ABNORMAL LOW (ref 4.0–10.5)
nRBC: 0 % (ref 0.0–0.2)

## 2019-06-19 LAB — COMPREHENSIVE METABOLIC PANEL
ALT: 30 U/L (ref 0–44)
AST: 17 U/L (ref 15–41)
Albumin: 2.5 g/dL — ABNORMAL LOW (ref 3.5–5.0)
Alkaline Phosphatase: 62 U/L (ref 38–126)
Anion gap: 8 (ref 5–15)
BUN: 10 mg/dL (ref 8–23)
CO2: 21 mmol/L — ABNORMAL LOW (ref 22–32)
Calcium: 7.1 mg/dL — ABNORMAL LOW (ref 8.9–10.3)
Chloride: 109 mmol/L (ref 98–111)
Creatinine, Ser: 0.72 mg/dL (ref 0.44–1.00)
GFR calc Af Amer: >60 mL/min (ref >60)
GFR calc non Af Amer: >60 mL/min (ref >60)
Glucose, Bld: 88 mg/dL (ref 70–99)
Potassium: 3.7 mmol/L (ref 3.5–5.1)
Sodium: 138 mmol/L (ref 135–145)
Total Bilirubin: 0.6 mg/dL (ref 0.3–1.2)
Total Protein: 4.7 g/dL — ABNORMAL LOW (ref 6.5–8.1)

## 2019-06-19 LAB — LACTATE DEHYDROGENASE: LDH: 218 U/L — ABNORMAL HIGH (ref 98–192)

## 2019-06-19 LAB — TYPE AND SCREEN
ABO/RH(D): B POS
Antibody Screen: NEGATIVE
Unit division: 0

## 2019-06-19 LAB — BPAM RBC
Blood Product Expiration Date: 202007282359
ISSUE DATE / TIME: 202006292038
Unit Type and Rh: 7300

## 2019-06-19 LAB — ABO/RH: ABO/RH(D): B POS

## 2019-06-19 MED ORDER — SODIUM CHLORIDE 0.9% FLUSH: 10.0000 mL | INTRAVENOUS | Status: DC | PRN

## 2019-06-19 MED ORDER — SODIUM CHLORIDE 0.9% FLUSH: 10.0000 mL | Freq: Two times a day (BID) | INTRAVENOUS | Status: DC

## 2019-06-19 NOTE — Evaluation (Signed)
Physical Therapy Evaluation Patient Details Name: Tracy Mclaughlin MRN: [id number] DOB: 12/28/1933 Today's Date: 06/19/2019   History of Present Illness  85 yo female admitted with pancytopenia, anemia, weakness. hx of NHL-on chemo, A fib, anemia, arthritis, PE, SAH  Clinical Impression  On eval, pt required Min guard-Min assist for mobility. She walked ~90 feet with a RW. Pt fatigues easily. Will recommend HHPT f/u if pt is agreeable. Will follow during hospital stay.     Follow Up Recommendations Home health PT;Supervision for mobility/OOB    Equipment Recommendations  None recommended by PT    Recommendations for Other Services       Precautions / Restrictions Precautions Precautions: Fall Restrictions Weight Bearing Restrictions: No      Mobility  Bed Mobility Overal bed mobility: Needs Assistance Bed Mobility: Supine to Sit     Supine to sit: Supervision;HOB elevated     General bed mobility comments: safety, lines  Transfers Overall transfer level: Needs assistance Equipment used: Rolling walker (2 wheeled) Transfers: Sit to/from Stand Sit to Stand: Min guard         General transfer comment: close guard for safety. VCs safety, hand placement  Ambulation/Gait Ambulation/Gait assistance: Min assist Gait Distance (Feet): 90 Feet Assistive device: Rolling walker (2 wheeled) Gait Pattern/deviations: Step-through pattern     General Gait Details: Intermittent assist 2* fatigue-mostly Min guard. Slow gait speed.  Stairs            Wheelchair Mobility    Modified Rankin (Stroke Patients Only)       Balance Overall balance assessment: Needs assistance         Standing balance support: Bilateral upper extremity supported Standing balance-Leahy Scale: Poor                               Pertinent Vitals/Pain Pain Assessment: No/denies pain    Home Living Family/patient expects to be discharged to:: Private  residence Living Arrangements: Spouse/significant other Available Help at Discharge: Family Type of Home: House Home Access: Stairs to enter   Technical brewer of Steps: 1-2 Home Layout: Two level;Able to live on main level with bedroom/bathroom Home Equipment: Gilford Rile - 2 wheels;Cane - single point;Shower seat;Bedside commode      Prior Function Level of Independence: Needs assistance   Gait / Transfers Assistance Needed: using RW vs cane  ADL's / Homemaking Assistance Needed: family helping as needed        Hand Dominance        Extremity/Trunk Assessment   Upper Extremity Assessment Upper Extremity Assessment: Generalized weakness    Lower Extremity Assessment Lower Extremity Assessment: Generalized weakness    Cervical / Trunk Assessment Cervical / Trunk Assessment: Normal  Communication   Communication: No difficulties  Cognition Arousal/Alertness: Awake/alert Behavior During Therapy: WFL for tasks assessed/performed Overall Cognitive Status: Within Functional Limits for tasks assessed                                        General Comments      Exercises     Assessment/Plan    PT Assessment Patient needs continued PT services  PT Problem List Decreased strength;Decreased mobility;Decreased activity tolerance;Decreased balance       PT Treatment Interventions DME instruction;Gait training;Therapeutic exercise;Therapeutic activities;Patient/family education;Balance training;Functional mobility training    PT Goals (Current goals can be  found in the Care Plan section)  Acute Rehab PT Goals Patient Stated Goal: to get better/stronger. home. PT Goal Formulation: With patient Time For Goal Achievement: 07/02/19 Potential to Achieve Goals: Good    Frequency Min 3X/week   Barriers to discharge        Co-evaluation               AM-PAC PT "6 Clicks" Mobility  Outcome Measure Help needed turning from your back to your  side while in a flat bed without using bedrails?: A Little Help needed moving from lying on your back to sitting on the side of a flat bed without using bedrails?: A Little Help needed moving to and from a bed to a chair (including a wheelchair)?: A Little Help needed standing up from a chair using your arms (e.g., wheelchair or bedside chair)?: A Little Help needed to walk in hospital room?: A Little Help needed climbing 3-5 steps with a railing? : A Little 6 Click Score: 18    End of Session Equipment Utilized During Treatment: Gait belt Activity Tolerance: Patient limited by fatigue Patient left: in bed;with call bell/phone within reach Nurse Communication: Mobility status(per nursing, pt has been refusing bed alarm. She should have supv/assist for ambulation) PT Visit Diagnosis: Muscle weakness (generalized) (M62.81);History of falling (Z91.81);Difficulty in walking, not elsewhere classified (R26.2)    Time: 4503-8882 PT Time Calculation (min) (ACUTE ONLY): 20 min   Charges:   PT Evaluation $PT Eval Moderate Complexity: [place], PT Acute Rehabilitation Services Pager: [phone] Office: [phone]

## 2019-06-19 NOTE — Plan of Care (Signed)

## 2019-06-19 NOTE — Progress Notes (Addendum)
HEMATOLOGY-ONCOLOGY PROGRESS NOTE  SUBJECTIVE: Reports that she is feeling better this morning. States that she ate more today than she has in the few weeks prior to admission. Nausea and vomiting have resolved. Still having diarrhea.   Oncology History  Non Hodgkin's lymphoma ([place])  09/29/2016 Initial Diagnosis   Non Hodgkin's lymphoma ([place])   03/22/2019 - 04/23/2019 Chemotherapy   The patient had palonosetron (ALOXI) injection 0.25 mg, 0.25 mg, Intravenous,  Once, 1 of 4 cycles Administration: 0.25 mg (03/22/2019) riTUXimab (RITUXAN) 800 mg in sodium chloride 0.9 % 250 mL (2.4242 mg/mL) infusion, 375 mg/m2 = 800 mg, Intravenous,  Once, 1 of 4 cycles Administration: 800 mg (03/22/2019) bendamustine (BENDEKA) 150 mg in sodium chloride 0.9 % 50 mL (2.6786 mg/mL) chemo infusion, 70 mg/m2 = 150 mg (100 % of original dose 70 mg/m2), Intravenous,  Once, 1 of 4 cycles Dose modification: 70 mg/m2 (original dose 70 mg/m2, Cycle 1, Reason: Provider Judgment) Administration: 150 mg (03/22/2019), 150 mg (03/23/2019)  for chemotherapy treatment.    05/18/2019 -  Chemotherapy   The patient had DOXOrubicin (ADRIAMYCIN) chemo injection 82 mg, 40 mg/m2 = 82 mg (100 % of original dose 40 mg/m2), Intravenous,  Once, 2 of 6 cycles Dose modification: 40 mg/m2 (original dose 40 mg/m2, Cycle 1, Reason: Provider Judgment) Administration: 82 mg (05/18/2019), 82 mg (06/08/2019) palonosetron (ALOXI) injection 0.25 mg, 0.25 mg, Intravenous,  Once, 2 of 6 cycles Administration: 0.25 mg (05/18/2019), 0.25 mg (06/08/2019) pegfilgrastim (NEULASTA ONPRO KIT) injection 6 mg, 6 mg, Subcutaneous, Once, 2 of 6 cycles Administration: 6 mg (05/18/2019), 6 mg (06/08/2019) vinCRIStine (ONCOVIN) 1 mg in sodium chloride 0.9 % 50 mL chemo infusion, 1 mg (100 % of original dose 1 mg), Intravenous,  Once, 2 of 6 cycles Dose modification: 1 mg (original dose 1 mg, Cycle 1, Reason: Provider Judgment) Administration: 1 mg (05/18/2019), 1 mg  (06/08/2019) riTUXimab (RITUXAN) 800 mg in sodium chloride 0.9 % 250 mL (2.4242 mg/mL) infusion, 375 mg/m2 = 800 mg, Intravenous,  Once, 2 of 6 cycles Administration: 800 mg (05/18/2019), 800 mg (06/08/2019) cyclophosphamide (CYTOXAN) 1,240 mg in sodium chloride 0.9 % 250 mL chemo infusion, 600 mg/m2 = 1,540 mg, Intravenous,  Once, 2 of 6 cycles Dose modification: 600 mg/m2 (original dose 750 mg/m2, Cycle 2, Reason: Provider Judgment) Administration: 1,240 mg (05/18/2019), 1,240 mg (06/08/2019) fosaprepitant (EMEND) 150 mg, dexamethasone (DECADRON) 12 mg in sodium chloride 0.9 % 145 mL IVPB, , Intravenous,  Once, 2 of 6 cycles Administration:  (05/18/2019),  (06/08/2019)  for chemotherapy treatment.       REVIEW OF SYSTEMS:   Constitutional: Denies fevers, chills  Eyes: Denies blurriness of vision Ears, nose, mouth, throat, and face: Denies mucositis or sore throat Respiratory: Denies cough, dyspnea or wheezes Cardiovascular: Denies palpitation, chest discomfort Gastrointestinal:  Denies nausea and vomiting. Still having loose stools.  Skin: Denies abnormal skin rashes Lymphatics: Denies new lymphadenopathy or easy bruising Neurological:Denies numbness, tingling or new weaknesses Behavioral/Psych: Mood is stable, no new changes  Extremities: No lower extremity edema All other systems were reviewed with the patient and are negative.  I have reviewed the past medical history, past surgical history, social history and family history with the patient and they are unchanged from previous note.   PHYSICAL EXAMINATION:  Vitals:   06/19/19 0511 06/19/19 0932  BP: (!) 119/50 (!) 111/58  Pulse: (!) 56 (!) 56  Resp: 16   Temp: 98.2 F (36.8 C)   SpO2: 94% 97%   [employer]  06/18/19 1616 06/19/19 0511  Weight: 187 lb 13.3 oz (85.2 kg) 189 lb 6 oz (85.9 kg)    Intake/Output from previous day: 06/29 0701 - 06/30 0700 In: 1213.3 [P.O.:480; I.V.:375; Blood:358.3] Out: -    GENERAL:alert, no distress and comfortable SKIN: skin color, texture, turgor are normal, no rashes or significant lesions EYES: normal, Conjunctiva are pink and non-injected, sclera clear OROPHARYNX:no exudate, no erythema and lips, buccal mucosa, and tongue normal  NECK: supple, thyroid normal size, non-tender, without nodularity LYMPH:  no palpable lymphadenopathy in the cervical, axillary or inguinal LUNGS: clear to auscultation and percussion with normal breathing effort HEART: regular rate & rhythm and no murmurs and no lower extremity edema ABDOMEN:abdomen soft, non-tender and normal bowel sounds, no hepatosplenomegaly Musculoskeletal:no cyanosis of digits and no clubbing  NEURO: alert & oriented x 3 with fluent speech, no focal motor/sensory deficits Lymph nodes: 1 cm left supraclavicular node, no axillary or inguinal nodes  LABORATORY DATA:  I have reviewed the data as listed CMP Latest Ref Rng & Units 06/19/2019 06/18/2019 06/15/2019  Glucose 70 - 99 mg/dL 88 87 85  BUN 8 - 23 mg/dL '10 14 21  ' Creatinine 0.44 - 1.00 mg/dL 0.72 0.81 0.76  Sodium 135 - 145 mmol/L 138 133(L) 137  Potassium 3.5 - 5.1 mmol/L 3.7 3.1(L) 4.3  Chloride 98 - 111 mmol/L 109 98 104  CO2 22 - 32 mmol/L 21(L) 24 26  Calcium 8.9 - 10.3 mg/dL 7.1(L) 7.5(L) 7.9(L)  Total Protein 6.5 - 8.1 g/dL 4.7(L) 5.1(L) 5.2(L)  Total Bilirubin 0.3 - 1.2 mg/dL 0.6 0.7 1.1  Alkaline Phos 38 - 126 U/L 62 59 87  AST 15 - 41 U/L 17 18 55(H)  ALT 0 - 44 U/L 30 38 82(H)    Lab Results  Component Value Date   WBC 3.1 (L) 06/19/2019   HGB 8.2 (L) 06/19/2019   HCT 25.6 (L) 06/19/2019   MCV 95.9 06/19/2019   PLT 86 (L) 06/19/2019   NEUTROABS 1.7 06/18/2019    Ct Chest W Contrast  Result Date: 06/15/2019 CLINICAL DATA:  Restaging lymphoma. EXAM: CT CHEST, ABDOMEN, AND PELVIS WITH CONTRAST TECHNIQUE: Multidetector CT imaging of the chest, abdomen and pelvis was performed following the standard protocol during bolus  administration of intravenous contrast. CONTRAST:  180m OMNIPAQUE IOHEXOL 300 MG/ML  SOLN COMPARISON:  PET-CT 03/08/2019 FINDINGS: CT CHEST FINDINGS Cardiovascular: The heart is normal in size. No pericardial effusion. The aorta is normal in caliber. No dissection. Stable atherosclerotic calcifications. Branch vessels are patent. No obvious coronary artery calcifications. Mediastinum/Nodes: Significant interval improvement in the adenopathy. Left supraclavicular nodal mass measures 3.7 x 1.9 cm on image 3. This previously measured 6.3 x 2.5 cm. Subclavicular node on image number 9 measures 13.5 mm and previously measured 23 mm. 18 mm right axillary node on image number 17 previously measured 25 mm. Prevascular lymph node on image number 26 measures 13.5 mm and previously measured 23 mm. Right-sided subcarinal node on image number 37 measures 15 mm and previously measured 19.5 mm. Lungs/Pleura: No acute pulmonary findings. No worrisome pulmonary lesions. No pleural effusion. Musculoskeletal: No significant bony findings. CT ABDOMEN PELVIS FINDINGS Hepatobiliary: No focal hepatic lesions are identified. Stable surgical changes from a cholecystectomy with persistent intra and extrahepatic biliary dilatation. Pancreas: No mass, inflammation or ductal dilatation. Spleen: Persistent mild splenomegaly. Spleen measures 14.2 x 9.6 x 10.7 cm. No focal lesions. Adrenals/Urinary Tract: The adrenal glands and kidneys are unremarkable and appears stable. No mass lesions,  renal calculi or hydronephrosis. Mild diffuse bladder wall thickening appears stable. Stomach/Bowel: The stomach, duodenum, small bowel and colon are unremarkable. No acute inflammatory changes, mass lesions or obstructive findings. Stable colonic diverticulosis. Vascular/Lymphatic: Stable atherosclerotic calcifications involving the aorta and iliac arteries. No aneurysm. The major vascular structures are patent. Slight improved diffuse retroperitoneal  lymphadenopathy. Nodal mass on image number 78 surrounding the aorta and IVC measures 8.2 x 3.5 cm and previously measured 9.5 x 5.0 cm. Persistent adenopathy continuing down along the common iliac arteries. Left common iliac node on image number 93 measures 3 cm and previously measured 3.7 cm. Left-sided external iliac node measures 15.5 mm and previously measured 21.5 mm. Stable bilateral inguinal lymph nodes. Reproductive: No significant findings. Other: Persistent free pelvic fluid. Musculoskeletal: No significant bony findings. IMPRESSION: 1. Persistent but improved diffuse adenopathy involving bilateral supraclavicular and axillary regions, mediastinum/hilum, retroperitoneum, pelvis and inguinal areas. 2. No new or progressive findings are identified. 3. Stable mild splenomegaly. 4. Status post cholecystectomy with stable intra and extrahepatic biliary dilatation. 5. No worrisome lung disease. Electronically Signed   By: Marijo Sanes M.D.   On: 06/15/2019 16:34   Ct Abdomen Pelvis W Contrast  Result Date: 06/15/2019 CLINICAL DATA:  Restaging lymphoma. EXAM: CT CHEST, ABDOMEN, AND PELVIS WITH CONTRAST TECHNIQUE: Multidetector CT imaging of the chest, abdomen and pelvis was performed following the standard protocol during bolus administration of intravenous contrast. CONTRAST:  119m OMNIPAQUE IOHEXOL 300 MG/ML  SOLN COMPARISON:  PET-CT 03/08/2019 FINDINGS: CT CHEST FINDINGS Cardiovascular: The heart is normal in size. No pericardial effusion. The aorta is normal in caliber. No dissection. Stable atherosclerotic calcifications. Branch vessels are patent. No obvious coronary artery calcifications. Mediastinum/Nodes: Significant interval improvement in the adenopathy. Left supraclavicular nodal mass measures 3.7 x 1.9 cm on image 3. This previously measured 6.3 x 2.5 cm. Subclavicular node on image number 9 measures 13.5 mm and previously measured 23 mm. 18 mm right axillary node on image number 17 previously  measured 25 mm. Prevascular lymph node on image number 26 measures 13.5 mm and previously measured 23 mm. Right-sided subcarinal node on image number 37 measures 15 mm and previously measured 19.5 mm. Lungs/Pleura: No acute pulmonary findings. No worrisome pulmonary lesions. No pleural effusion. Musculoskeletal: No significant bony findings. CT ABDOMEN PELVIS FINDINGS Hepatobiliary: No focal hepatic lesions are identified. Stable surgical changes from a cholecystectomy with persistent intra and extrahepatic biliary dilatation. Pancreas: No mass, inflammation or ductal dilatation. Spleen: Persistent mild splenomegaly. Spleen measures 14.2 x 9.6 x 10.7 cm. No focal lesions. Adrenals/Urinary Tract: The adrenal glands and kidneys are unremarkable and appears stable. No mass lesions, renal calculi or hydronephrosis. Mild diffuse bladder wall thickening appears stable. Stomach/Bowel: The stomach, duodenum, small bowel and colon are unremarkable. No acute inflammatory changes, mass lesions or obstructive findings. Stable colonic diverticulosis. Vascular/Lymphatic: Stable atherosclerotic calcifications involving the aorta and iliac arteries. No aneurysm. The major vascular structures are patent. Slight improved diffuse retroperitoneal lymphadenopathy. Nodal mass on image number 78 surrounding the aorta and IVC measures 8.2 x 3.5 cm and previously measured 9.5 x 5.0 cm. Persistent adenopathy continuing down along the common iliac arteries. Left common iliac node on image number 93 measures 3 cm and previously measured 3.7 cm. Left-sided external iliac node measures 15.5 mm and previously measured 21.5 mm. Stable bilateral inguinal lymph nodes. Reproductive: No significant findings. Other: Persistent free pelvic fluid. Musculoskeletal: No significant bony findings. IMPRESSION: 1. Persistent but improved diffuse adenopathy involving bilateral supraclavicular and axillary regions,  mediastinum/hilum, retroperitoneum, pelvis and  inguinal areas. 2. No new or progressive findings are identified. 3. Stable mild splenomegaly. 4. Status post cholecystectomy with stable intra and extrahepatic biliary dilatation. 5. No worrisome lung disease. Electronically Signed   By: Marijo Sanes M.D.   On: 06/15/2019 16:34   Dg Chest Portable 1 View  Result Date: 06/18/2019 CLINICAL DATA:  85 y.o female takes chemo (splenic lymphoma) and reports for a couple weeks been fatigued, vomiting and diarrheacough EXAM: PORTABLE CHEST 1 VIEW COMPARISON:  05/15/2019 FINDINGS: Port in the anterior chest wall with tip in distal SVC. Normal mediastinum and cardiac silhouette. Normal pulmonary vasculature. No evidence of effusion, infiltrate, or pneumothorax. No acute bony abnormality. LEFT axillary and nodal dissection clips IMPRESSION: No acute cardiopulmonary process. Electronically Signed   By: Suzy Bouchard M.D.   On: 06/18/2019 12:26   Dg Abd Portable 1 View  Result Date: 06/18/2019 CLINICAL DATA:  Nausea and vomiting EXAM: PORTABLE ABDOMEN - 1 VIEW COMPARISON:  None. FINDINGS: The bowel gas pattern is normal. No radio-opaque calculi or other significant radiographic abnormality are seen. IMPRESSION: Negative. 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 Status post weekly Rituxan x4 03/01/2012 through 03/22/2012. She completed 4 "maintenance" doses of Rituxan, last on 12/19/2012. A restaging CT on 02/09/2013 showed no evidence of lymphoma.   Lymph node lateral to the thyroid bed on a neck ultrasound 02/21/2014, status post an FNA biopsy concerning for a lymphoproliferative disorder.  PET scan 09/28/2016 with active lymphoma within the neck, chest, abdomen, pelvis; splenic enlargement and hypermetabolism suspicious for splenic involvement.  Initiation of  Rituxan weekly 4 09/29/2016  Initiation of maintenance Rituxan on a 3 month schedule 12/23/2016; final Rituxan 08/31/2018  Thyroid ultrasound 02/07/2019-left cervical lymphadenopathy  PET scan 03/08/2019-extensive recurrent hypermetabolic lymphoma involving the neck, chest, abdomen and pelvis.  03/16/2019 left cervical lymph node biopsy-features consistent with previously diagnosed non-Hodgkin's B-cell lymphoma, phenotypically consistent with marginal zone lymphoma. Flow cytometry with lambda restricted B-cell population without expression of CD5 or CD10 comprising 87% of all lymphocytes.  Cycle 1 bendamustine/Rituxan 03/22/2019  Excision deep left axillary lymph nodes 05/04/2019-non-Hodgkin's B-cell lymphoma with differential including a marginal zone lymphoma and atypical small lymphocytic lymphoma. Flow cytometry with monoclonal B-cell population without expression of CD5 or CD10, comprises 96% of all lymphocytes.  Cycle 1 CHOP/Rituxan 05/18/2019  Cycle 2 CHOP/Rituxan 06/08/2019  CTs  06/15/2019- partial improvement in diffuse adenopathy, stable mild splenomegaly 2. Stage IV (T1bN1b M0) papillary thyroid cancer, status post a thyroidectomy with reimplantation of the left superior parathyroid gland on 05/23/2012, status post radioactive iodine therapy, followed by Dr. Buddy Duty.  3. Stage II (T3 N0) colon cancer, status post a right colectomy 10/19/2011, last colonoscopy April 2015-sigmoid adenoma removed.  4. History of a pulmonary embolism December 2012.  5. History of Atrial fibrillation 6. Iron deficiency anemia-new 03/18/2014. Hemoccult positive stool. The anemia corrected with iron. No longer taking iron.  Status post an upper endoscopy and colonoscopy by Dr. Carlean Purl April 2015 with no bleeding source identified, benign adenoma removed from the sigmoid colon. 7. Report of an upper gastric intestinal bleed fall 2016-managed in [place]. [name] 8. Left knee replacement May 2017, repeat left knee  surgery May 2018 9. Pruritic rash 07/22/2016 10. Nausea and diarrhea 04/02/2019-stool negative for C. difficile toxin 11. 06/18/2019 [facility] admission for  symptomatic anemia.  Ms. Bonini is feeling better this morning. Has received 1 unit PRBC with improvement in her hemoglobin. She has mild leukopenia and thrombocytopenia. This is likely due to her recent chemo. She has no bleeding and is afebrile. Nausea and vomiting have resolved. Still having diarrhea. Possible due to recent CT contrast.  1. Monitor CBC. Transfuse if hemoglobin is less than 7.0 or active bleeding. No transfusion indicated today. 2. Recommend stool for c diff and GI panel if diarrhea does not not improve.  3. Recent CT scan showed stable to slight improvement in her cancer. Will plan to discuss further treatment for her lymphoma as an outpatient.  4. Out of bed, increase ambulation as tolerated   LOS: 0 days   Mikey Bussing, DNP, AGPCNP-BC, AOCNP 06/19/19 Ms. Koffman was interviewed and examined.  She is now at day 12 following cycle 2 CHOP/rituximab.  The restaging CTs last week reveal partial improvement in the diffuse lymphadenopathy.  She was admitted yesterday with severe symptomatic anemia.  She feels better today.  I suspect her malaise is related to anemia and chemotherapy. The white count has recovered compared to on 06/15/2019 and the platelets appear stable.  She reports diarrhea beginning with the CT contrast on Friday.  I have a low clinical suspicion for C. difficile infection at present.  Outpatient follow-up will be scheduled at the Cancer center.  She will likely be followed off of chemotherapy.

## 2019-06-19 NOTE — Progress Notes (Signed)
PROGRESS NOTE    Tracy Mclaughlin  TDD:[id number] DOB: 1934/07/18 DOA: 06/18/2019 PCP: [facility], [place]   Brief Narrative: Per HPI 85 y.o. female with medical history significant of non-Hodgkin's lymphoma, atrial fibrillation, hypertension, anemia, history of A. fib, arthritis, hypertension, history of PE, history of subarachnoid hemorrhage presented to ER with a complaint of generalized weakness, nausea and intermittent vomiting since she has started chemotherapy however star but since 6/28 significantly weak.  No report of fever or chills abdominal pain chest pain.  Had diarrhea about 3 to 4 days ago-and had CT scan of the abdomen by her oncologist.  In the ER vitals stable work-up revealed pancytopenia hemoglobin 7.5 g platelet count 85K, WBC count 2.6K, 1 unit PRBC ordered COVID-19 was negative and admission was requested. Overnight after hydration and blood transfusion patient feels much improved.  Subjective: This morning patient feels much better, but he still had loose stool. No Nausea vomiting.  Assessment & Plan:  Pancytopenia with anemia, thrombocytopenia and leukopenia: Likely due to chemotherapy counts low but stable, hemoglobin improved 8.2 g transfusion.  Continue general precaution, oncology to see the patient today.  Patient overall feeling much better today.   Recent Labs  Lab 06/15/19 1203 06/18/19 1235 06/19/19 0704  HGB 8.2* 7.5* 8.2*  HCT 25.2* 22.9* 25.6*  WBC 1.4* 2.6* 3.1*  PLT 121* 85* 86*   Generalized weakness: likely multifactorial due to pancytopenia anemia and dehydration with nausea vomiting.  Feeling much better after transfusion.  Continue IV hydration, obtain PT evaluation today.  Non Hodgkin's lymphoma currently receiving [facility] and has completed 2 sessions so far.  Her onconology to address further.  Nausea vomiting and diarrhea: Unclear etiology, having nausea and vomiting since her chemo.  This morning still having diarrhea  but frequency seem to be slowing down.  If does not improve can consider stool studies, for now continue on IV fluid hydration and supportive care.  Hypertension: Blood pressure well controlled.  Continue her losartan and Cardizem.  PAF: In normal sinus rhythm, on Cardizem.  Not on anticoagulation at home.  Hypokalemia:  improved.  DVT prophylaxis: SCD. Heparin/lovenox-contraindicated Code Status: DNR Family Communication: Discussed with patient. Disposition Plan: Due to ongoing loose stool oncology recommends to monitor overnight.  Continue IV fluid hydration.Consider discharging in the morning if remains a stable and okay with hematology oncology.    Consultants:  oncology  Procedures: None  Antimicrobials: Anti-infectives (From admission, onward)   None       Objective: Vitals:   06/18/19 2108 06/18/19 2345 06/19/19 0511 06/19/19 0932  BP: (!) 114/41 (!) 114/46 (!) 119/50 (!) 111/58  Pulse: (!) 59 (!) 59 (!) 56 (!) 56  Resp: 16 16 16    Temp: 98.7 F (37.1 C) 97.8 F (36.6 C) 98.2 F (36.8 C)   TempSrc: Oral Oral Oral   SpO2: 97% 94% 94% 97%  Weight:   85.9 kg   Height:        Intake/Output Summary (Last 24 hours) at 06/19/2019 1047 Last data filed at 06/18/2019 2345 Gross per 24 hour  Intake 1213.33 ml  Output -  Net 1213.33 ml   Filed Weights   06/18/19 1616 06/19/19 0511  Weight: 85.2 kg 85.9 kg   Weight change:   Body mass index is 29.66 kg/m.  Intake/Output from previous day: 06/29 0701 - 06/30 0700 In: 1213.3 [P.O.:480; I.V.:375; Blood:358.3] Out: -  Intake/Output this shift: No intake/output data recorded.  Examination:  General exam: Appears calm and  comfortable, not in acute distress, elderly, weak. HEENT:PERRL,Oral mucosa moist, Ear/Nose normal on gross exam Respiratory system: Bilateral equal air entry, normal vesicular breath sounds, no wheezes or crackles  Cardiovascular system: S1 & S2 heard,No JVD, murmurs. Gastrointestinal system:  Abdomen is  soft, non tender, non distended, BS +  Nervous System:Alert and oriented. No focal neurological deficits/moving extremities, sensation intact. Extremities: No edema, no clubbing, distal peripheral pulses palpable. Skin: No rashes, lesions, no icterus MSK: Normal muscle bulk,tone ,power  Medications:  Scheduled Meds: . citalopram  40 mg Oral Daily  . diltiazem  180 mg Oral Daily  . levothyroxine  224 mcg Oral QAC breakfast  . losartan  100 mg Oral Daily  . sodium chloride flush  10-40 mL Intracatheter Q12H  . sodium chloride flush  3 mL Intravenous Q12H   Continuous Infusions: . sodium chloride 75 mL/hr at 06/18/19 1620    Data Reviewed: I have personally reviewed following labs and imaging studies  CBC: Recent Labs  Lab 06/15/19 1203 06/18/19 1235 06/19/19 0704  WBC 1.4* 2.6* 3.1*  NEUTROABS 1.0* 1.7  --   HGB 8.2* 7.5* 8.2*  HCT 25.2* 22.9* 25.6*  MCV 96.2 96.2 95.9  PLT 121* 85* 86*   Basic Metabolic Panel: Recent Labs  Lab 06/15/19 1203 06/18/19 1235 06/19/19 0704  NA 137 133* 138  K 4.3 3.1* 3.7  CL 104 98 109  CO2 26 24 21*  GLUCOSE 85 87 88  BUN 21 14 10   CREATININE 0.76 0.81 0.72  CALCIUM 7.9* 7.5* 7.1*   GFR: Estimated Creatinine Clearance: 61 mL/min (by C-G formula based on SCr of 0.72 mg/dL). Liver Function Tests: Recent Labs  Lab 06/15/19 1203 06/18/19 1235 06/19/19 0704  AST 55* 18 17  ALT 82* 38 30  ALKPHOS 87 59 62  BILITOT 1.1 0.7 0.6  PROT 5.2* 5.1* 4.7*  ALBUMIN 3.1* 2.8* 2.5*   Recent Labs  Lab 06/18/19 1235  LIPASE 18   No results for input(s): AMMONIA in the last 168 hours. Coagulation Profile: No results for input(s): INR, PROTIME in the last 168 hours. Cardiac Enzymes: No results for input(s): CKTOTAL, CKMB, CKMBINDEX, TROPONINI in the last 168 hours. BNP (last 3 results) No results for input(s): PROBNP in the last 8760 hours. HbA1C: No results for input(s): HGBA1C in the last 72 hours. CBG: No results  for input(s): GLUCAP in the last 168 hours. Lipid Profile: No results for input(s): CHOL, HDL, LDLCALC, TRIG, CHOLHDL, LDLDIRECT in the last 72 hours. Thyroid Function Tests: No results for input(s): TSH, T4TOTAL, FREET4, T3FREE, THYROIDAB in the last 72 hours. Anemia Panel: No results for input(s): VITAMINB12, FOLATE, FERRITIN, TIBC, IRON, RETICCTPCT in the last 72 hours. Sepsis Labs: No results for input(s): PROCALCITON, LATICACIDVEN in the last 168 hours.  Recent Results (from the past 240 hour(s))  SARS Coronavirus 2 (CEPHEID- Performed in [place] hospital lab), Hosp Order     Status: None   Collection Time: 06/18/19 12:35 PM   Specimen: Nasopharyngeal Swab  Result Value Ref Range Status   SARS Coronavirus 2 NEGATIVE NEGATIVE Final    Comment: (NOTE) If result is NEGATIVE SARS-CoV-2 target nucleic acids are NOT DETECTED. The SARS-CoV-2 RNA is generally detectable in upper and lower  respiratory specimens during the acute phase of infection. The lowest  concentration of SARS-CoV-2 viral copies this assay can detect is 250  copies / mL. A negative result does not preclude SARS-CoV-2 infection  and should not be used as the sole  basis for treatment or other  patient management decisions.  A negative result may occur with  improper specimen collection / handling, submission of specimen other  than nasopharyngeal swab, presence of viral mutation(s) within the  areas targeted by this assay, and inadequate number of viral copies  (<250 copies / mL). A negative result must be combined with clinical  observations, patient history, and epidemiological information. If result is POSITIVE SARS-CoV-2 target nucleic acids are DETECTED. The SARS-CoV-2 RNA is generally detectable in upper and lower  respiratory specimens dur ing the acute phase of infection.  Positive  results are indicative of active infection with SARS-CoV-2.  Clinical  correlation with patient history and other  diagnostic information is  necessary to determine patient infection status.  Positive results do  not rule out bacterial infection or co-infection with other viruses. If result is PRESUMPTIVE POSTIVE SARS-CoV-2 nucleic acids MAY BE PRESENT.   A presumptive positive result was obtained on the submitted specimen  and confirmed on repeat testing.  While 2019 novel coronavirus  (SARS-CoV-2) nucleic acids may be present in the submitted sample  additional confirmatory testing may be necessary for epidemiological  and / or clinical management purposes  to differentiate between  SARS-CoV-2 and other Sarbecovirus currently known to infect humans.  If clinically indicated additional testing with an alternate test  methodology [phone]) is advised. The SARS-CoV-2 RNA is generally  detectable in upper and lower respiratory sp ecimens during the acute  phase of infection. The expected result is Negative. Fact Sheet for Patients:  StrictlyIdeas.no Fact Sheet for Healthcare Providers: BankingDealers.co.za This test is not yet approved or cleared by the [place] FDA and has been authorized for detection and/or diagnosis of SARS-CoV-2 by FDA under an Emergency Use Authorization (EUA).  This EUA will remain in effect (meaning this test can be used) for the duration of the COVID-19 declaration under Section 564(b)(1) of the Act, 21 U.S.C. section 360bbb-3(b)(1), unless the authorization is terminated or revoked sooner. 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 Normal mediastinum  and cardiac silhouette. Normal pulmonary vasculature. No evidence of effusion, infiltrate, or pneumothorax. No acute bony abnormality. LEFT axillary and nodal dissection clips IMPRESSION: No acute cardiopulmonary process. Electronically Signed   By: Suzy Bouchard M.D.   On: 06/18/2019 12:26   Dg Abd Portable 1 View  Result Date: 06/18/2019 CLINICAL DATA:  Nausea and vomiting EXAM: PORTABLE ABDOMEN - 1 VIEW COMPARISON:  None. FINDINGS: The bowel gas pattern is normal. No radio-opaque calculi or other significant radiographic abnormality are seen. IMPRESSION: Negative. Electronically Signed   By: Kathreen Devoid   On: 06/18/2019 12:15      LOS: 0 days   Time spent: More than 50% of that time was spent in counseling and/or coordination of care.  Antonieta Pert, MD [facility]  06/19/2019, 10:47 AM

## 2019-06-20 DIAGNOSIS — E860 Dehydration: Secondary | ICD-10-CM

## 2019-06-20 LAB — CBC WITH DIFFERENTIAL/PLATELET
Abs Immature Granulocytes: 0.09 10*3/uL — ABNORMAL HIGH (ref 0.00–0.07)
Basophils Absolute: 0 10*3/uL (ref 0.0–0.1)
Basophils Relative: 1 %
Eosinophils Absolute: 0 10*3/uL (ref 0.0–0.5)
Eosinophils Relative: 0 %
HCT: 27.7 % — ABNORMAL LOW (ref 36.0–46.0)
Hemoglobin: 8.8 g/dL — ABNORMAL LOW (ref 12.0–15.0)
Immature Granulocytes: 2 %
Lymphocytes Relative: 20 %
Lymphs Abs: 1 10*3/uL (ref 0.7–4.0)
MCH: 30.8 pg (ref 26.0–34.0)
MCHC: 31.8 g/dL (ref 30.0–36.0)
MCV: 96.9 fL (ref 80.0–100.0)
Monocytes Absolute: 0.5 10*3/uL (ref 0.1–1.0)
Monocytes Relative: 11 %
Neutro Abs: 3.2 10*3/uL (ref 1.7–7.7)
Neutrophils Relative %: 66 %
Platelets: 131 10*3/uL — ABNORMAL LOW (ref 150–400)
RBC: 2.86 MIL/uL — ABNORMAL LOW (ref 3.87–5.11)
RDW: 17 % — ABNORMAL HIGH (ref 11.5–15.5)
WBC: 4.8 10*3/uL (ref 4.0–10.5)
nRBC: 0 % (ref 0.0–0.2)

## 2019-06-20 MED ORDER — HEPARIN SOD (PORK) LOCK FLUSH 100 UNIT/ML IV SOLN
500.0000 [IU] | INTRAVENOUS | Status: AC | PRN
  Administered 2019-06-20: 500 [IU]

## 2019-06-20 NOTE — Discharge Instructions (Signed)

## 2019-06-20 NOTE — TOC Initial Note (Signed)
Transition of Care [facility]) - Initial/Assessment Note    Patient Details  Name: Tracy Mclaughlin MRN: [id number] Date of Birth: 1934/07/20  [facility] CM/SW Contact:    Purcell Mouton, RN Phone Number: 06/20/2019, 11:52 AM  Clinical Narrative:                   Expected Discharge Plan: Home/Self Care Barriers to Discharge: No Barriers Identified   Patient Goals and CMS Choice     Choice offered to / list presented to : Patient  Expected Discharge Plan and Services Expected Discharge Plan: Home/Self Care   Discharge Planning Services: CM Consult   Living arrangements for the past 2 months: Single Family Home Expected Discharge Date: 06/20/19                         HH Arranged: Refused HH          Prior Living Arrangements/Services Living arrangements for the past 2 months: Single Family Home Lives with:: Spouse Patient language and need for interpreter reviewed:: No                 Activities of Daily Living Home Assistive Devices/Equipment: Eyeglasses, Environmental consultant (specify type)(front wheeled walker) ADL Screening (condition at time of admission) Patient's cognitive ability adequate to safely complete daily activities?: Yes Is the patient deaf or have difficulty hearing?: No Does the patient have difficulty seeing, even when wearing glasses/contacts?: No Does the patient have difficulty concentrating, remembering, or making decisions?: No Patient able to express need for assistance with ADLs?: Yes Does the patient have difficulty dressing or bathing?: No Independently performs ADLs?: Yes (appropriate for developmental age) Does the patient have difficulty walking or climbing stairs?: Yes(secondary to weakness) Weakness of Legs: Both Weakness of Arms/Hands: None  Permission Sought/Granted                  Emotional Assessment Appearance:: Appears stated age     Orientation: : Oriented to Self, Oriented to Place, Oriented to   Time, Oriented to Situation      Admission diagnosis:  Pancytopenia ([place]) [D61.818] Acute dehydration [E86.0] Patient Active Problem List   Diagnosis Date Noted  . Anemia 06/18/2019  . Pancytopenia ([place]) 06/18/2019  . Port-A-Cath in place 05/18/2019  . Goals of care, counseling/discussion 03/21/2019  . Hypersensitivity reaction 09/29/2016  . PAF (paroxysmal atrial fibrillation) ([place]) 07/07/2016  . Personal history of colonic polyps - cancer and adenoma 04/17/2014  . Anemia, iron deficiency 04/02/2014  . Heme + stool 04/02/2014  . Personal history of colon cancer 04/02/2014  . Primary thyroid cancer ([place]) 05/01/2012  . Colon cancer ([place]) 11/24/2011  . Pulmonary embolism ([place]) 11/24/2011  . Non Hodgkin's lymphoma ([place])   . Subarachnoid hemorrhage ([place])   . Arthritis   . Hypertension    PCP:  [facility], [place]:   [place], [place] - [address] [place] [address] Phone: [phone] Fax: [phone]     Social Determinants of Health (SDOH) Interventions    Readmission Risk Interventions No flowsheet data found.

## 2019-06-20 NOTE — Progress Notes (Signed)
Pt declined [place] at present time. Encouraged pt to call her PCP once she is home and feel that she need HH.

## 2019-06-20 NOTE — Progress Notes (Addendum)
HEMATOLOGY-ONCOLOGY PROGRESS NOTE  SUBJECTIVE: Feels well. Diarrhea has resolved. No nausea or vomiting.  Afebrile.   Oncology History  Non Hodgkin's lymphoma ([place])  09/29/2016 Initial Diagnosis   Non Hodgkin's lymphoma ([place])   03/22/2019 - 04/23/2019 Chemotherapy   The patient had palonosetron (ALOXI) injection 0.25 mg, 0.25 mg, Intravenous,  Once, 1 of 4 cycles Administration: 0.25 mg (03/22/2019) riTUXimab (RITUXAN) 800 mg in sodium chloride 0.9 % 250 mL (2.4242 mg/mL) infusion, 375 mg/m2 = 800 mg, Intravenous,  Once, 1 of 4 cycles Administration: 800 mg (03/22/2019) bendamustine (BENDEKA) 150 mg in sodium chloride 0.9 % 50 mL (2.6786 mg/mL) chemo infusion, 70 mg/m2 = 150 mg (100 % of original dose 70 mg/m2), Intravenous,  Once, 1 of 4 cycles Dose modification: 70 mg/m2 (original dose 70 mg/m2, Cycle 1, Reason: Provider Judgment) Administration: 150 mg (03/22/2019), 150 mg (03/23/2019)  for chemotherapy treatment.    05/18/2019 -  Chemotherapy   The patient had DOXOrubicin (ADRIAMYCIN) chemo injection 82 mg, 40 mg/m2 = 82 mg (100 % of original dose 40 mg/m2), Intravenous,  Once, 2 of 6 cycles Dose modification: 40 mg/m2 (original dose 40 mg/m2, Cycle 1, Reason: Provider Judgment) Administration: 82 mg (05/18/2019), 82 mg (06/08/2019) palonosetron (ALOXI) injection 0.25 mg, 0.25 mg, Intravenous,  Once, 2 of 6 cycles Administration: 0.25 mg (05/18/2019), 0.25 mg (06/08/2019) pegfilgrastim (NEULASTA ONPRO KIT) injection 6 mg, 6 mg, Subcutaneous, Once, 2 of 6 cycles Administration: 6 mg (05/18/2019), 6 mg (06/08/2019) vinCRIStine (ONCOVIN) 1 mg in sodium chloride 0.9 % 50 mL chemo infusion, 1 mg (100 % of original dose 1 mg), Intravenous,  Once, 2 of 6 cycles Dose modification: 1 mg (original dose 1 mg, Cycle 1, Reason: Provider Judgment) Administration: 1 mg (05/18/2019), 1 mg (06/08/2019) riTUXimab (RITUXAN) 800 mg in sodium chloride 0.9 % 250 mL (2.4242 mg/mL) infusion, 375 mg/m2 = 800 mg, Intravenous,   Once, 2 of 6 cycles Administration: 800 mg (05/18/2019), 800 mg (06/08/2019) cyclophosphamide (CYTOXAN) 1,240 mg in sodium chloride 0.9 % 250 mL chemo infusion, 600 mg/m2 = 1,540 mg, Intravenous,  Once, 2 of 6 cycles Dose modification: 600 mg/m2 (original dose 750 mg/m2, Cycle 2, Reason: Provider Judgment) Administration: 1,240 mg (05/18/2019), 1,240 mg (06/08/2019) fosaprepitant (EMEND) 150 mg, dexamethasone (DECADRON) 12 mg in sodium chloride 0.9 % 145 mL IVPB, , Intravenous,  Once, 2 of 6 cycles Administration:  (05/18/2019),  (06/08/2019)  for chemotherapy treatment.       REVIEW OF SYSTEMS:   Constitutional: Denies fevers, chills  Eyes: Denies blurriness of vision Ears, nose, mouth, throat, and face: Denies mucositis or sore throat Respiratory: Denies cough, dyspnea or wheezes Cardiovascular: Denies palpitation, chest discomfort Gastrointestinal: Denies nausea and vomiting. Diarrhea has resolved.  Skin: Denies abnormal skin rashes Lymphatics: Denies new lymphadenopathy or easy bruising Neurological:Denies numbness, tingling or new weaknesses Behavioral/Psych: Mood is stable, no new changes  Extremities: No lower extremity edema All other systems were reviewed with the patient and are negative.  I have reviewed the past medical history, past surgical history, social history and family history with the patient and they are unchanged from previous note.   PHYSICAL EXAMINATION:  Vitals:   06/19/19 2201 06/20/19 0534  BP: (!) 126/47 (!) 119/46  Pulse: (!) 55 (!) 53  Resp: 20 13  Temp: 98.2 F (36.8 C) 98 F (36.7 C)  SpO2: 97% 96%   Filed Weights   06/18/19 1616 06/19/19 0511 06/20/19 0532  Weight: 187 lb 13.3 oz (85.2 kg) 189 lb 6 oz (85.9  kg) 194 lb 0.1 oz (88 kg)    Intake/Output from previous day: 06/30 0701 - 07/01 0700 In: 2105 [P.O.:480; I.V.:1625] Out: -   GENERAL:alert, no distress and comfortable SKIN: skin color, texture, turgor are normal, no rashes or  significant lesions EYES: normal, Conjunctiva are pink and non-injected, sclera clear OROPHARYNX:no exudate, no erythema and lips, buccal mucosa, and tongue normal  NECK: supple, thyroid normal size, non-tender, without nodularity LYMPH:  no palpable lymphadenopathy in the cervical, axillary or inguinal LUNGS: clear to auscultation and percussion with normal breathing effort HEART: regular rate & rhythm and no murmurs and no lower extremity edema ABDOMEN:abdomen soft, non-tender and normal bowel sounds, no hepatosplenomegaly Musculoskeletal:no cyanosis of digits and no clubbing  NEURO: alert & oriented x 3 with fluent speech, no focal motor/sensory deficits Lymph nodes: 1 cm left supraclavicular node, no axillary or inguinal nodes  LABORATORY DATA:  I have reviewed the data as listed CMP Latest Ref Rng & Units 06/19/2019 06/18/2019 06/15/2019  Glucose 70 - 99 mg/dL 88 87 85  BUN 8 - 23 mg/dL '10 14 21  ' Creatinine 0.44 - 1.00 mg/dL 0.72 0.81 0.76  Sodium 135 - 145 mmol/L 138 133(L) 137  Potassium 3.5 - 5.1 mmol/L 3.7 3.1(L) 4.3  Chloride 98 - 111 mmol/L 109 98 104  CO2 22 - 32 mmol/L 21(L) 24 26  Calcium 8.9 - 10.3 mg/dL 7.1(L) 7.5(L) 7.9(L)  Total Protein 6.5 - 8.1 g/dL 4.7(L) 5.1(L) 5.2(L)  Total Bilirubin 0.3 - 1.2 mg/dL 0.6 0.7 1.1  Alkaline Phos 38 - 126 U/L 62 59 87  AST 15 - 41 U/L 17 18 55(H)  ALT 0 - 44 U/L 30 38 82(H)    Lab Results  Component Value Date   WBC 4.8 06/20/2019   HGB 8.8 (L) 06/20/2019   HCT 27.7 (L) 06/20/2019   MCV 96.9 06/20/2019   PLT 131 (L) 06/20/2019   NEUTROABS 3.2 06/20/2019    Ct Chest W Contrast  Result Date: 06/15/2019 CLINICAL DATA:  Restaging lymphoma. EXAM: CT CHEST, ABDOMEN, AND PELVIS WITH CONTRAST TECHNIQUE: Multidetector CT imaging of the chest, abdomen and pelvis was performed following the standard protocol during bolus administration of intravenous contrast. CONTRAST:  118m OMNIPAQUE IOHEXOL 300 MG/ML  SOLN COMPARISON:  PET-CT  03/08/2019 FINDINGS: CT CHEST FINDINGS Cardiovascular: The heart is normal in size. No pericardial effusion. The aorta is normal in caliber. No dissection. Stable atherosclerotic calcifications. Branch vessels are patent. No obvious coronary artery calcifications. Mediastinum/Nodes: Significant interval improvement in the adenopathy. Left supraclavicular nodal mass measures 3.7 x 1.9 cm on image 3. This previously measured 6.3 x 2.5 cm. Subclavicular node on image number 9 measures 13.5 mm and previously measured 23 mm. 18 mm right axillary node on image number 17 previously measured 25 mm. Prevascular lymph node on image number 26 measures 13.5 mm and previously measured 23 mm. Right-sided subcarinal node on image number 37 measures 15 mm and previously measured 19.5 mm. Lungs/Pleura: No acute pulmonary findings. No worrisome pulmonary lesions. No pleural effusion. Musculoskeletal: No significant bony findings. CT ABDOMEN PELVIS FINDINGS Hepatobiliary: No focal hepatic lesions are identified. Stable surgical changes from a cholecystectomy with persistent intra and extrahepatic biliary dilatation. Pancreas: No mass, inflammation or ductal dilatation. Spleen: Persistent mild splenomegaly. Spleen measures 14.2 x 9.6 x 10.7 cm. No focal lesions. Adrenals/Urinary Tract: The adrenal glands and kidneys are unremarkable and appears stable. No mass lesions, renal calculi or hydronephrosis. Mild diffuse bladder wall thickening appears stable. Stomach/Bowel: The  stomach, duodenum, small bowel and colon are unremarkable. No acute inflammatory changes, mass lesions or obstructive findings. Stable colonic diverticulosis. Vascular/Lymphatic: Stable atherosclerotic calcifications involving the aorta and iliac arteries. No aneurysm. The major vascular structures are patent. Slight improved diffuse retroperitoneal lymphadenopathy. Nodal mass on image number 78 surrounding the aorta and IVC measures 8.2 x 3.5 cm and previously  measured 9.5 x 5.0 cm. Persistent adenopathy continuing down along the common iliac arteries. Left common iliac node on image number 93 measures 3 cm and previously measured 3.7 cm. Left-sided external iliac node measures 15.5 mm and previously measured 21.5 mm. Stable bilateral inguinal lymph nodes. Reproductive: No significant findings. Other: Persistent free pelvic fluid. Musculoskeletal: No significant bony findings. IMPRESSION: 1. Persistent but improved diffuse adenopathy involving bilateral supraclavicular and axillary regions, mediastinum/hilum, retroperitoneum, pelvis and inguinal areas. 2. No new or progressive findings are identified. 3. Stable mild splenomegaly. 4. Status post cholecystectomy with stable intra and extrahepatic biliary dilatation. 5. No worrisome lung disease. Electronically Signed   By: Marijo Sanes M.D.   On: 06/15/2019 16:34   Ct Abdomen Pelvis W Contrast  Result Date: 06/15/2019 CLINICAL DATA:  Restaging lymphoma. EXAM: CT CHEST, ABDOMEN, AND PELVIS WITH CONTRAST TECHNIQUE: Multidetector CT imaging of the chest, abdomen and pelvis was performed following the standard protocol during bolus administration of intravenous contrast. CONTRAST:  12m OMNIPAQUE IOHEXOL 300 MG/ML  SOLN COMPARISON:  PET-CT 03/08/2019 FINDINGS: CT CHEST FINDINGS Cardiovascular: The heart is normal in size. No pericardial effusion. The aorta is normal in caliber. No dissection. Stable atherosclerotic calcifications. Branch vessels are patent. No obvious coronary artery calcifications. Mediastinum/Nodes: Significant interval improvement in the adenopathy. Left supraclavicular nodal mass measures 3.7 x 1.9 cm on image 3. This previously measured 6.3 x 2.5 cm. Subclavicular node on image number 9 measures 13.5 mm and previously measured 23 mm. 18 mm right axillary node on image number 17 previously measured 25 mm. Prevascular lymph node on image number 26 measures 13.5 mm and previously measured 23 mm.  Right-sided subcarinal node on image number 37 measures 15 mm and previously measured 19.5 mm. Lungs/Pleura: No acute pulmonary findings. No worrisome pulmonary lesions. No pleural effusion. Musculoskeletal: No significant bony findings. CT ABDOMEN PELVIS FINDINGS Hepatobiliary: No focal hepatic lesions are identified. Stable surgical changes from a cholecystectomy with persistent intra and extrahepatic biliary dilatation. Pancreas: No mass, inflammation or ductal dilatation. Spleen: Persistent mild splenomegaly. Spleen measures 14.2 x 9.6 x 10.7 cm. No focal lesions. Adrenals/Urinary Tract: The adrenal glands and kidneys are unremarkable and appears stable. No mass lesions, renal calculi or hydronephrosis. Mild diffuse bladder wall thickening appears stable. Stomach/Bowel: The stomach, duodenum, small bowel and colon are unremarkable. No acute inflammatory changes, mass lesions or obstructive findings. Stable colonic diverticulosis. Vascular/Lymphatic: Stable atherosclerotic calcifications involving the aorta and iliac arteries. No aneurysm. The major vascular structures are patent. Slight improved diffuse retroperitoneal lymphadenopathy. Nodal mass on image number 78 surrounding the aorta and IVC measures 8.2 x 3.5 cm and previously measured 9.5 x 5.0 cm. Persistent adenopathy continuing down along the common iliac arteries. Left common iliac node on image number 93 measures 3 cm and previously measured 3.7 cm. Left-sided external iliac node measures 15.5 mm and previously measured 21.5 mm. Stable bilateral inguinal lymph nodes. Reproductive: No significant findings. Other: Persistent free pelvic fluid. Musculoskeletal: No significant bony findings. IMPRESSION: 1. Persistent but improved diffuse adenopathy involving bilateral supraclavicular and axillary regions, mediastinum/hilum, retroperitoneum, pelvis and inguinal areas. 2. No new or progressive findings are  identified. 3. Stable mild splenomegaly. 4. Status  post cholecystectomy with stable intra and extrahepatic biliary dilatation. 5. No worrisome lung disease. Electronically Signed   By: Marijo Sanes M.D.   On: 06/15/2019 16:34   Dg Chest Portable 1 View  Result Date: 06/18/2019 CLINICAL DATA:  85 y.o female takes chemo (splenic lymphoma) and reports for a couple weeks been fatigued, vomiting and diarrheacough EXAM: PORTABLE CHEST 1 VIEW COMPARISON:  05/15/2019 FINDINGS: Port in the anterior chest wall with tip in distal SVC. Normal mediastinum and cardiac silhouette. Normal pulmonary vasculature. No evidence of effusion, infiltrate, or pneumothorax. No acute bony abnormality. LEFT axillary and nodal dissection clips IMPRESSION: No acute cardiopulmonary process. Electronically Signed   By: Suzy Bouchard M.D.   On: 06/18/2019 12:26   Dg Abd Portable 1 View  Result Date: 06/18/2019 CLINICAL DATA:  Nausea and vomiting EXAM: PORTABLE ABDOMEN - 1 VIEW COMPARISON:  None. FINDINGS: The bowel gas pattern is normal. No radio-opaque calculi or other significant radiographic abnormality are seen. IMPRESSION: Negative. 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 Status post weekly Rituxan x4 03/01/2012 through 03/22/2012. She completed 4 "maintenance" doses of Rituxan, last on 12/19/2012. A restaging CT on 02/09/2013 showed no evidence of lymphoma.   Lymph node lateral to the thyroid bed on a neck ultrasound 02/21/2014, status post an FNA biopsy concerning for a lymphoproliferative disorder.  PET scan 09/28/2016 with active lymphoma within the neck, chest, abdomen, pelvis; splenic enlargement and hypermetabolism suspicious for splenic involvement.  Initiation of Rituxan weekly 4 09/29/2016  Initiation of maintenance Rituxan on a 3 month schedule 12/23/2016;  final Rituxan 08/31/2018  Thyroid ultrasound 02/07/2019-left cervical lymphadenopathy  PET scan 03/08/2019-extensive recurrent hypermetabolic lymphoma involving the neck, chest, abdomen and pelvis.  03/16/2019 left cervical lymph node biopsy-features consistent with previously diagnosed non-Hodgkin's B-cell lymphoma, phenotypically consistent with marginal zone lymphoma. Flow cytometry with lambda restricted B-cell population without expression of CD5 or CD10 comprising 87% of all lymphocytes.  Cycle 1 bendamustine/Rituxan 03/22/2019  Excision deep left axillary lymph nodes 05/04/2019-non-Hodgkin's B-cell lymphoma with differential including a marginal zone lymphoma and atypical small lymphocytic lymphoma. Flow cytometry with monoclonal B-cell population without expression of CD5 or CD10, comprises 96% of all lymphocytes.  Cycle 1 [facility]/Rituxan 05/18/2019  Cycle 2 [facility]/Rituxan 06/08/2019  CTs  06/15/2019- partial improvement in diffuse adenopathy, stable mild splenomegaly 2. Stage IV (T1bN1b M0) papillary thyroid cancer, status post a thyroidectomy with reimplantation of the left superior parathyroid gland on 05/23/2012, status post radioactive iodine therapy, followed by Dr. Buddy Duty.  3. Stage II (T3 N0) colon cancer, status post a right colectomy 10/19/2011, last colonoscopy April 2015-sigmoid adenoma removed.  4. History of a pulmonary embolism December 2012.  5. History of Atrial fibrillation 6. Iron deficiency anemia-new 03/18/2014. Hemoccult positive stool. The anemia corrected with iron. No longer taking iron.  Status post an upper endoscopy and colonoscopy by Dr. Carlean Purl April 2015 with no bleeding source identified, benign adenoma removed from the sigmoid colon. 7. Report of an upper gastric intestinal bleed fall 2016-managed in [place]. [name] 8. Left knee replacement May 2017, repeat left knee surgery May 2018 9. Pruritic rash 07/22/2016 10. Nausea and diarrhea 04/02/2019-stool negative for  C. difficile toxin 11. 06/18/2019 [facility] admission for symptomatic anemia.  Tracy Mclaughlin appears improved. Her hemoglobin, WBC, and platelets have  all improved.  Her nausea, vomiting, diarrhea have all resolved.  She is hoping to go home later today.  1.  Tracy Mclaughlin is stable for discharge from medical oncology standpoint.  She has outpatient follow-up already scheduled on July 10 and she will keep this appointment.  We will recheck her CBC, CMP, and LDH on that date and discuss further plans for treatment.   LOS: 1 day   Mikey Bussing, DNP, AGPCNP-BC, AOCNP 06/20/19 Tracy Mclaughlin feels better.  She appears stable for discharge to home.  She will follow-up as scheduled at the Cancer center next week.  The etiology of the markedly elevated LDH last week is unclear.

## 2019-06-20 NOTE — Discharge Summary (Addendum)
Physician Discharge Summary  Marabeth Melland DJM:[id number] DOB: 06/11/1934  PCP: [facility], Agenda date: 06/18/2019 Discharge date: 06/20/2019  Recommendations for Outpatient Follow-up:  1. Dr. Dominica Severin B. Sherrill, Oncology: Patient has an appointment on 7/10 at 9:30 AM for labs (CBC, CMP, LDH) and MD follow-up at 10 AM. 2. PCP  Home Health: PT Equipment/Devices: None  Discharge Condition: Improved and stable CODE STATUS: DNR Diet recommendation: Heart healthy diet  Discharge Diagnoses:  Active Problems:   Non Hodgkin's lymphoma ([place])   Hypertension   Anemia   Pancytopenia (HCC)   Brief Summary: 85 year old female with PMH of non-Hodgkin's lymphoma on chemotherapy, A. fib not on anticoagulation, HTN, anemia, PE, subarachnoid hemorrhage, presented to ED with complaints of generalized weakness, nausea and intermittent vomiting since she started chemotherapy and 3 to 4 days history of diarrhea.  She reported that she always gets diarrhea after she gets oral contrast which she did receive 3 days PTA when she underwent abdominal CT done as an outpatient by her oncologist. No reported fever, chills or abdominal pain.  She was admitted for pancytopenia related to her chemotherapy and generalized weakness due to symptomatic anemia and dehydration.  Assessment and plan:  1. Pancytopenia: Likely due to chemotherapy.  Transfused 1 unit of PRBC and hemoglobin improved from 7.5-8.8.  Leukopenia resolved.  Thrombocytopenia improving.  No bleeding reported.  Medical Oncology continued to see her in the hospital including today and have cleared her for discharge home with close outpatient follow-up with repeat labs. 2. Generalized weakness: Multifactorial due to pancytopenia/anemia, dehydration related to GI losses.  Improved.  PT recommends home health PT. 3. Non-Hodgkin's lymphoma: Receiving CHOP as outpatient, follow-up with oncology arranged. 4. Nausea, vomiting and  diarrhea: Nausea and vomiting may be related to chemotherapy.  Diarrhea could be related to oral contrast which she usually has as per her history.  Resolved.  Tolerating diet. 5. Essential hypertension: Controlled.  Continue home dose of losartan and Cardizem. 6. PAF: Clinically appears to be in sinus rhythm.  Continue Cardizem.  Not on anticoagulation PTA. 7. Hypokalemia: Resolved.  8. Hypothyroid: Clinically euthyroid.  Continue Synthroid   Consultations:  Medical Oncology  Procedures:  None   Discharge Instructions  Discharge Instructions    Call MD for:   Complete by: As directed    Recurrent diarrhea.   Call MD for:  difficulty breathing, headache or visual disturbances   Complete by: As directed    Call MD for:  extreme fatigue   Complete by: As directed    Call MD for:  persistant dizziness or light-headedness   Complete by: As directed    Call MD for:  persistant nausea and vomiting   Complete by: As directed    Call MD for:  severe uncontrolled pain   Complete by: As directed    Call MD for:  temperature >100.4   Complete by: As directed    Diet - low sodium heart healthy   Complete by: As directed    Increase activity slowly   Complete by: As directed        Medication List    STOP taking these medications   HYDROcodone-acetaminophen 5-325 MG tablet Commonly known as: Norco   predniSONE 20 MG tablet Commonly known as: DELTASONE     TAKE these medications   citalopram 40 MG tablet Commonly known as: CELEXA Take 40 mg by mouth daily.   diltiazem 180 MG 24 hr capsule Commonly known as: DILACOR XR  Take 1 capsule (180 mg total) by mouth daily.   diphenhydrAMINE 25 MG tablet Commonly known as: BENADRYL Take 25 mg by mouth at bedtime as needed. For itching/sleep   levothyroxine 112 MCG tablet Commonly known as: SYNTHROID Take 224 mcg by mouth daily before breakfast. Pt takes two 112 mcg tablets to equal 224 mcg once a day   losartan 100 MG  tablet Commonly known as: COZAAR Take 100 mg by mouth daily.   ondansetron 8 MG tablet Commonly known as: ZOFRAN Take 1 tablet (8 mg total) by mouth every 8 (eight) hours as needed for nausea or vomiting.      Follow-up Information    [name], MD Follow up on 06/29/2019.   Specialty: Oncology Why: Keep previous appointment for labs and MD follow-up. Contact information: [place] [address] [place], [place]. Schedule an appointment as soon as possible for a visit.   Contact information: [place] [address] [phone]        Belva Crome, MD .   Specialty: Cardiology Contact information: [phone] [place] [address] [phone]          Allergies  Allergen Reactions  . Demerol [Meperidine Hcl] Nausea And Vomiting      Procedures/Studies: Ct Chest W Contrast  Result Date: 06/15/2019 CLINICAL DATA:  Restaging lymphoma. EXAM: CT CHEST, ABDOMEN, AND PELVIS WITH CONTRAST TECHNIQUE: Multidetector CT imaging of the chest, abdomen and pelvis was performed following the standard protocol during bolus administration of intravenous contrast. CONTRAST:  169mL OMNIPAQUE IOHEXOL 300 MG/ML  SOLN COMPARISON:  PET-CT 03/08/2019 FINDINGS: CT CHEST FINDINGS Cardiovascular: The heart is normal in size. No pericardial effusion. The aorta is normal in caliber. No dissection. Stable atherosclerotic calcifications. Branch vessels are patent. No obvious coronary artery calcifications. Mediastinum/Nodes: Significant interval improvement in the adenopathy. Left supraclavicular nodal mass measures 3.7 x 1.9 cm on image 3. This previously measured 6.3 x 2.5 cm. Subclavicular node on image number 9 measures 13.5 mm and previously measured 23 mm. 18 mm right axillary node on image number 17 previously measured 25 mm. Prevascular lymph node on image number 26 measures 13.5 mm and  previously measured 23 mm. Right-sided subcarinal node on image number 37 measures 15 mm and previously measured 19.5 mm. Lungs/Pleura: No acute pulmonary findings. No worrisome pulmonary lesions. No pleural effusion. Musculoskeletal: No significant bony findings. CT ABDOMEN PELVIS FINDINGS Hepatobiliary: No focal hepatic lesions are identified. Stable surgical changes from a cholecystectomy with persistent intra and extrahepatic biliary dilatation. Pancreas: No mass, inflammation or ductal dilatation. Spleen: Persistent mild splenomegaly. Spleen measures 14.2 x 9.6 x 10.7 cm. No focal lesions. Adrenals/Urinary Tract: The adrenal glands and kidneys are unremarkable and appears stable. No mass lesions, renal calculi or hydronephrosis. Mild diffuse bladder wall thickening appears stable. Stomach/Bowel: The stomach, duodenum, small bowel and colon are unremarkable. No acute inflammatory changes, mass lesions or obstructive findings. Stable colonic diverticulosis. Vascular/Lymphatic: Stable atherosclerotic calcifications involving the aorta and iliac arteries. No aneurysm. The major vascular structures are patent. Slight improved diffuse retroperitoneal lymphadenopathy. Nodal mass on image number 78 surrounding the aorta and IVC measures 8.2 x 3.5 cm and previously measured 9.5 x 5.0 cm. Persistent adenopathy continuing down along the common iliac arteries. Left common iliac node on image number 93 measures 3 cm and previously measured 3.7 cm. Left-sided external iliac node measures 15.5 mm and  previously measured 21.5 mm. Stable bilateral inguinal lymph nodes. Reproductive: No significant findings. Other: Persistent free pelvic fluid. Musculoskeletal: No significant bony findings. IMPRESSION: 1. Persistent but improved diffuse adenopathy involving bilateral supraclavicular and axillary regions, mediastinum/hilum, retroperitoneum, pelvis and inguinal areas. 2. No new or progressive findings are identified. 3. Stable  mild splenomegaly. 4. Status post cholecystectomy with stable intra and extrahepatic biliary dilatation. 5. No worrisome lung disease. Electronically Signed   By: Marijo Sanes M.D.   On: 06/15/2019 16:34   Ct Abdomen Pelvis W Contrast  Result Date: 06/15/2019 CLINICAL DATA:  Restaging lymphoma. EXAM: CT CHEST, ABDOMEN, AND PELVIS WITH CONTRAST TECHNIQUE: Multidetector CT imaging of the chest, abdomen and pelvis was performed following the standard protocol during bolus administration of intravenous contrast. CONTRAST:  147mL OMNIPAQUE IOHEXOL 300 MG/ML  SOLN COMPARISON:  PET-CT 03/08/2019 FINDINGS: CT CHEST FINDINGS Cardiovascular: The heart is normal in size. No pericardial effusion. The aorta is normal in caliber. No dissection. Stable atherosclerotic calcifications. Branch vessels are patent. No obvious coronary artery calcifications. Mediastinum/Nodes: Significant interval improvement in the adenopathy. Left supraclavicular nodal mass measures 3.7 x 1.9 cm on image 3. This previously measured 6.3 x 2.5 cm. Subclavicular node on image number 9 measures 13.5 mm and previously measured 23 mm. 18 mm right axillary node on image number 17 previously measured 25 mm. Prevascular lymph node on image number 26 measures 13.5 mm and previously measured 23 mm. Right-sided subcarinal node on image number 37 measures 15 mm and previously measured 19.5 mm. Lungs/Pleura: No acute pulmonary findings. No worrisome pulmonary lesions. No pleural effusion. Musculoskeletal: No significant bony findings. CT ABDOMEN PELVIS FINDINGS Hepatobiliary: No focal hepatic lesions are identified. Stable surgical changes from a cholecystectomy with persistent intra and extrahepatic biliary dilatation. Pancreas: No mass, inflammation or ductal dilatation. Spleen: Persistent mild splenomegaly. Spleen measures 14.2 x 9.6 x 10.7 cm. No focal lesions. Adrenals/Urinary Tract: The adrenal glands and kidneys are unremarkable and appears stable. No  mass lesions, renal calculi or hydronephrosis. Mild diffuse bladder wall thickening appears stable. Stomach/Bowel: The stomach, duodenum, small bowel and colon are unremarkable. No acute inflammatory changes, mass lesions or obstructive findings. Stable colonic diverticulosis. Vascular/Lymphatic: Stable atherosclerotic calcifications involving the aorta and iliac arteries. No aneurysm. The major vascular structures are patent. Slight improved diffuse retroperitoneal lymphadenopathy. Nodal mass on image number 78 surrounding the aorta and IVC measures 8.2 x 3.5 cm and previously measured 9.5 x 5.0 cm. Persistent adenopathy continuing down along the common iliac arteries. Left common iliac node on image number 93 measures 3 cm and previously measured 3.7 cm. Left-sided external iliac node measures 15.5 mm and previously measured 21.5 mm. Stable bilateral inguinal lymph nodes. Reproductive: No significant findings. Other: Persistent free pelvic fluid. Musculoskeletal: No significant bony findings. IMPRESSION: 1. Persistent but improved diffuse adenopathy involving bilateral supraclavicular and axillary regions, mediastinum/hilum, retroperitoneum, pelvis and inguinal areas. 2. No new or progressive findings are identified. 3. Stable mild splenomegaly. 4. Status post cholecystectomy with stable intra and extrahepatic biliary dilatation. 5. No worrisome lung disease. Electronically Signed   By: Marijo Sanes M.D.   On: 06/15/2019 16:34   Dg Chest Portable 1 View  Result Date: 06/18/2019 CLINICAL DATA:  85 y.o female takes chemo (splenic lymphoma) and reports for a couple weeks been fatigued, vomiting and diarrheacough EXAM: PORTABLE CHEST 1 VIEW COMPARISON:  05/15/2019 FINDINGS: Port in the anterior chest wall with tip in distal SVC. Normal mediastinum and cardiac silhouette. Normal pulmonary vasculature. No evidence of effusion,  infiltrate, or pneumothorax. No acute bony abnormality. LEFT axillary and nodal  dissection clips IMPRESSION: No acute cardiopulmonary process. Electronically Signed   By: Suzy Bouchard M.D.   On: 06/18/2019 12:26   Dg Abd Portable 1 View  Result Date: 06/18/2019 CLINICAL DATA:  Nausea and vomiting EXAM: PORTABLE ABDOMEN - 1 VIEW COMPARISON:  None. FINDINGS: The bowel gas pattern is normal. No radio-opaque calculi or other significant radiographic abnormality are seen. IMPRESSION: Negative. Electronically Signed   By: Kathreen Devoid   On: 06/18/2019 12:15      Subjective: Patient denies complaints.  She is anxious to go home.  Has already been seen by medical oncology this morning who told her that she may go home.  She denies nausea, vomiting or abdominal pain.  Tolerating diet.  Had one episode of loose stools yesterday but none since.  No dizziness or lightheadedness reported.  Discharge Exam:  Vitals:   06/19/19 1403 06/19/19 2201 06/20/19 0532 06/20/19 0534  BP: (!) 101/46 (!) 126/47  (!) 119/46  Pulse: (!) 56 (!) 55  (!) 53  Resp: 16 20  13   Temp: 98 F (36.7 C) 98.2 F (36.8 C)  98 F (36.7 C)  TempSrc: Oral Oral  Oral  SpO2: 95% 97%  96%  Weight:   88 kg   Height:        General: Pt lying comfortably in bed & appears in no obvious distress.  Oral mucosa moist. Cardiovascular: S1 & S2 heard, RRR, S1/S2 +. No murmurs, rubs, gallops or clicks. No JVD or pedal edema.  Not on telemetry this morning. Respiratory: Clear to auscultation without wheezing, rhonchi or crackles. No increased work of breathing. Abdominal:  Non distended, non tender & soft. No organomegaly or masses appreciated. Normal bowel sounds heard. CNS: Alert and oriented. No focal deficits. Extremities: no edema, no cyanosis    The results of significant diagnostics from this hospitalization (including imaging, microbiology, ancillary and laboratory) are listed below for reference.     Microbiology: Recent Results (from the past 240 hour(s))  SARS Coronavirus 2 (CEPHEID- Performed in  [place] hospital lab), Hosp Order     Status: None   Collection Time: 06/18/19 12:35 PM   Specimen: Nasopharyngeal Swab  Result Value Ref Range Status   SARS Coronavirus 2 NEGATIVE NEGATIVE Final    Comment: (NOTE) If result is NEGATIVE SARS-CoV-2 target nucleic acids are NOT DETECTED. The SARS-CoV-2 RNA is generally detectable in upper and lower  respiratory specimens during the acute phase of infection. The lowest  concentration of SARS-CoV-2 viral copies this assay can detect is 250  copies / mL. A negative result does not preclude SARS-CoV-2 infection  and should not be used as the sole basis for treatment or other  patient management decisions.  A negative result may occur with  improper specimen collection / handling, submission of specimen other  than nasopharyngeal swab, presence of viral mutation(s) within the  areas targeted by this assay, and inadequate number of viral copies  (<250 copies / mL). A negative result must be combined with clinical  observations, patient history, and epidemiological information. If result is POSITIVE SARS-CoV-2 target nucleic acids are DETECTED. The SARS-CoV-2 RNA is generally detectable in upper and lower  respiratory specimens dur ing the acute phase of infection.  Positive  results are indicative of active infection with SARS-CoV-2.  Clinical  correlation with patient history and other diagnostic information is  necessary to determine patient infection status.  Positive results  do  not rule out bacterial infection or co-infection with other viruses. If result is PRESUMPTIVE POSTIVE SARS-CoV-2 nucleic acids MAY BE PRESENT.   A presumptive positive result was obtained on the submitted specimen  and confirmed on repeat testing.  While 2019 novel coronavirus  (SARS-CoV-2) nucleic acids may be present in the submitted sample  additional confirmatory testing may be necessary for epidemiological  and / or clinical management purposes  to  differentiate between  SARS-CoV-2 and other Sarbecovirus currently known to infect humans.  If clinically indicated additional testing with an alternate test  methodology [phone]) is advised. The SARS-CoV-2 RNA is generally  detectable in upper and lower respiratory sp ecimens during the acute  phase of infection. The expected result is Negative. Fact Sheet for Patients:  StrictlyIdeas.no Fact Sheet for Healthcare Providers: BankingDealers.co.za This test is not yet approved or cleared by the [place] FDA and has been authorized for detection and/or diagnosis of SARS-CoV-2 by FDA under an Emergency Use Authorization (EUA).  This EUA will remain in effect (meaning this test can be used) for the duration of the COVID-19 declaration under Section 564(b)(1) of the Act, 21 U.S.C. section 360bbb-3(b)(1), unless the authorization is terminated or revoked sooner. Performed at [facility], [place] [address] [place], [place] [address]      Labs: CBC: Recent Labs  Lab 06/15/19 1203 06/18/19 1235 06/19/19 0704 06/20/19 0452  WBC 1.4* 2.6* 3.1* 4.8  NEUTROABS 1.0* 1.7  --  3.2  HGB 8.2* 7.5* 8.2* 8.8*  HCT 25.2* 22.9* 25.6* 27.7*  MCV 96.2 96.2 95.9 96.9  PLT 121* 85* 86* 003*   Basic Metabolic Panel: Recent Labs  Lab 06/15/19 1203 06/18/19 1235 06/19/19 0704  NA 137 133* 138  K 4.3 3.1* 3.7  CL 104 98 109  CO2 26 24 21*  GLUCOSE 85 87 88  BUN 21 14 10   CREATININE 0.76 0.81 0.72  CALCIUM 7.9* 7.5* 7.1*   Liver Function Tests: Recent Labs  Lab 06/15/19 1203 06/18/19 1235 06/19/19 0704  AST 55* 18 17  ALT 82* 38 30  ALKPHOS 87 59 62  BILITOT 1.1 0.7 0.6  PROT 5.2* 5.1* 4.7*  ALBUMIN 3.1* 2.8* 2.5*      Time coordinating discharge: 40 minutes  SIGNED:  Vernell Leep, MD, FACP, [facility]. [facility]  To contact the attending provider between 7A-7P or the covering provider during after  hours 7P-7A, please log into the web site [email or web address] and access using universal  password for that web site. If you do not have the password, please call the hospital operator.

## 2019-06-29 ENCOUNTER — Inpatient Hospital Stay: Payer: Medicare PPO

## 2019-06-29 ENCOUNTER — Inpatient Hospital Stay: Payer: Medicare PPO | Attending: Oncology | Admitting: Oncology

## 2019-06-29 ENCOUNTER — Other Ambulatory Visit: Payer: Self-pay

## 2019-06-29 VITALS — BP 132/47 | HR 60 | Temp 98.5°F | Resp 17 | Ht 67.0 in | Wt 189.5 lb

## 2019-06-29 DIAGNOSIS — D649 Anemia, unspecified: Secondary | ICD-10-CM | POA: Diagnosis not present

## 2019-06-29 DIAGNOSIS — R5381 Other malaise: Secondary | ICD-10-CM | POA: Insufficient documentation

## 2019-06-29 DIAGNOSIS — R740 Nonspecific elevation of levels of transaminase and lactic acid dehydrogenase [LDH]: Secondary | ICD-10-CM | POA: Diagnosis not present

## 2019-06-29 DIAGNOSIS — C8510 Unspecified B-cell lymphoma, unspecified site: Secondary | ICD-10-CM

## 2019-06-29 DIAGNOSIS — C8261 Cutaneous follicle center lymphoma, lymph nodes of head, face, and neck: Secondary | ICD-10-CM | POA: Insufficient documentation

## 2019-06-29 DIAGNOSIS — C73 Malignant neoplasm of thyroid gland: Secondary | ICD-10-CM

## 2019-06-29 DIAGNOSIS — I4891 Unspecified atrial fibrillation: Secondary | ICD-10-CM | POA: Insufficient documentation

## 2019-06-29 DIAGNOSIS — Z86711 Personal history of pulmonary embolism: Secondary | ICD-10-CM | POA: Insufficient documentation

## 2019-06-29 DIAGNOSIS — C8518 Unspecified B-cell lymphoma, lymph nodes of multiple sites: Secondary | ICD-10-CM

## 2019-06-29 DIAGNOSIS — Z5112 Encounter for antineoplastic immunotherapy: Secondary | ICD-10-CM | POA: Diagnosis present

## 2019-06-29 DIAGNOSIS — Z95828 Presence of other vascular implants and grafts: Secondary | ICD-10-CM

## 2019-06-29 DIAGNOSIS — C8267 Cutaneous follicle center lymphoma, spleen: Secondary | ICD-10-CM | POA: Diagnosis present

## 2019-06-29 LAB — CBC WITH DIFFERENTIAL (CANCER CENTER ONLY)
Abs Immature Granulocytes: 0.07 10*3/uL (ref 0.00–0.07)
Basophils Absolute: 0.1 10*3/uL (ref 0.0–0.1)
Basophils Relative: 1 %
Eosinophils Absolute: 0 10*3/uL (ref 0.0–0.5)
Eosinophils Relative: 0 %
HCT: 31.4 % — ABNORMAL LOW (ref 36.0–46.0)
Hemoglobin: 10.1 g/dL — ABNORMAL LOW (ref 12.0–15.0)
Immature Granulocytes: 1 %
Lymphocytes Relative: 18 %
Lymphs Abs: 1 10*3/uL (ref 0.7–4.0)
MCH: 30.8 pg (ref 26.0–34.0)
MCHC: 32.2 g/dL (ref 30.0–36.0)
MCV: 95.7 fL (ref 80.0–100.0)
Monocytes Absolute: 0.9 10*3/uL (ref 0.1–1.0)
Monocytes Relative: 16 %
Neutro Abs: 3.7 10*3/uL (ref 1.7–7.7)
Neutrophils Relative %: 64 %
Platelet Count: 227 10*3/uL (ref 150–400)
RBC: 3.28 MIL/uL — ABNORMAL LOW (ref 3.87–5.11)
RDW: 17.8 % — ABNORMAL HIGH (ref 11.5–15.5)
WBC Count: 5.8 10*3/uL (ref 4.0–10.5)
nRBC: 0 % (ref 0.0–0.2)

## 2019-06-29 LAB — CMP (CANCER CENTER ONLY)
ALT: 9 U/L (ref 0–44)
AST: 21 U/L (ref 15–41)
Albumin: 3 g/dL — ABNORMAL LOW (ref 3.5–5.0)
Alkaline Phosphatase: 91 U/L (ref 38–126)
Anion gap: 8 (ref 5–15)
BUN: 9 mg/dL (ref 8–23)
CO2: 29 mmol/L (ref 22–32)
Calcium: 8.6 mg/dL — ABNORMAL LOW (ref 8.9–10.3)
Chloride: 102 mmol/L (ref 98–111)
Creatinine: 0.82 mg/dL (ref 0.44–1.00)
GFR, Est AFR Am: >60 mL/min (ref >60)
GFR, Estimated: >60 mL/min (ref >60)
Glucose, Bld: 86 mg/dL (ref 70–99)
Potassium: 4.2 mmol/L (ref 3.5–5.1)
Sodium: 139 mmol/L (ref 135–145)
Total Bilirubin: 0.7 mg/dL (ref 0.3–1.2)
Total Protein: 5.5 g/dL — ABNORMAL LOW (ref 6.5–8.1)

## 2019-06-29 LAB — LACTATE DEHYDROGENASE: LDH: 455 U/L — ABNORMAL HIGH (ref 98–192)

## 2019-06-29 MED ORDER — SODIUM CHLORIDE 0.9% FLUSH
10.0000 mL | Freq: Once | INTRAVENOUS | Status: AC
  Administered 2019-06-29: 10:00:00 10 mL
  Filled 2019-06-29: qty 10

## 2019-06-29 MED ORDER — HEPARIN SOD (PORK) LOCK FLUSH 100 UNIT/ML IV SOLN
500.0000 [IU] | Freq: Once | INTRAVENOUS | Status: DC
  Filled 2019-06-29: qty 5

## 2019-06-29 MED ORDER — SODIUM CHLORIDE 0.9% FLUSH
10.0000 mL | INTRAVENOUS | Status: DC | PRN
  Administered 2019-06-29: 11:00:00 10 mL via INTRAVENOUS
  Filled 2019-06-29: qty 10

## 2019-06-29 NOTE — Progress Notes (Signed)
Reports persistent weakness-uses walker at home. Does not feel she needs physical therapy or any help in the home. States she wants no more chemotherapy.

## 2019-06-29 NOTE — Progress Notes (Signed)
[place] OFFICE PROGRESS NOTE   Diagnosis: Non-Hodgkin's lymphoma  INTERVAL HISTORY:   Tracy Mclaughlin was admitted last week with symptomatic anemia.  She had diarrhea during the hospital admission that she felt was related to CT contrast.  The diarrhea has resolved.  She complains of nausea when she smells food cooking.  She continues to have malaise.  No fever, dyspnea, or sweats.  She is ambulatory within the home and is performing self-care. She was transfused with 1 unit of packed red blood cells on 06/18/2019.  She reports feeling much better. Objective:  Vital signs in last 24 hours:  Blood pressure (!) 132/47, pulse 60, temperature 98.5 F (36.9 C), temperature source Oral, resp. rate 17, height 5\' 7"  (1.702 m), weight 189 lb 8 oz (86 kg), SpO2 97 %.   Limited physical examination secondary to distancing with the COVID pandemic HEENT: Neck without mass Lymphatics: 1.5 cm left supraclavicular,1-2 centimeter bilateral axillary nodes, no inguinal nodes GI: Nontender, no hepatosplenomegaly Vascular: No leg edema    Portacath/PICC-without erythema  Lab Results:  Lab Results  Component Value Date   WBC 5.8 06/29/2019   HGB 10.1 (L) 06/29/2019   HCT 31.4 (L) 06/29/2019   MCV 95.7 06/29/2019   PLT 227 06/29/2019   NEUTROABS 3.7 06/29/2019    CMP  Lab Results  Component Value Date   NA 138 06/19/2019   K 3.7 06/19/2019   CL 109 06/19/2019   CO2 21 (L) 06/19/2019   GLUCOSE 88 06/19/2019   BUN 10 06/19/2019   CREATININE 0.72 06/19/2019   CALCIUM 7.1 (L) 06/19/2019   PROT 4.7 (L) 06/19/2019   ALBUMIN 2.5 (L) 06/19/2019   AST 17 06/19/2019   ALT 30 06/19/2019   ALKPHOS 62 06/19/2019   BILITOT 0.6 06/19/2019   GFRNONAA >60 06/19/2019   GFRAA >60 06/19/2019   LDH 455  Medications: I have reviewed the patient's current medications.   Assessment/Plan: 1. 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 Status post weekly Rituxan x4 03/01/2012 through 03/22/2012. She completed 4 "maintenance" doses of Rituxan, last on 12/19/2012. A restaging CT on 02/09/2013 showed no evidence of lymphoma.   Lymph node lateral to the thyroid bed on a neck ultrasound 02/21/2014, status post an FNA biopsy concerning for a lymphoproliferative disorder.  PET scan 09/28/2016 with active lymphoma within the neck, chest, abdomen, pelvis; splenic enlargement and hypermetabolism suspicious for splenic involvement.  Initiation of Rituxan weekly 4 09/29/2016  Initiation of maintenance Rituxan on a 3 month schedule 12/23/2016; final Rituxan 08/31/2018  Thyroid ultrasound 02/07/2019-left cervical lymphadenopathy  PET scan 03/08/2019-extensive recurrent hypermetabolic lymphoma involving the neck, chest, abdomen and pelvis.  03/16/2019 left cervical lymph node biopsy-features consistent with previously diagnosed non-Hodgkin's B-cell lymphoma, phenotypically consistent with marginal zone lymphoma. Flow cytometry with lambda restricted B-cell population without expression of CD5 or CD10 comprising 87% of all lymphocytes.  Cycle 1 bendamustine/Rituxan 03/22/2019  Excision deep left axillary lymph nodes 05/04/2019-non-Hodgkin's B-cell lymphoma with differential including a marginal zone lymphoma and atypical small lymphocytic lymphoma. Flow cytometry with monoclonal B-cell population without expression of CD5 or CD10, comprises 96% of all lymphocytes.  Cycle 1 CHOP/Rituxan 05/18/2019  Cycle 2 CHOP/Rituxan 06/08/2019  CTs  06/15/2019- partial improvement in diffuse adenopathy, stable mild splenomegaly 2. Stage IV (T1bN1b M0) papillary thyroid cancer, status post a thyroidectomy with reimplantation of the left superior parathyroid gland on 05/23/2012, status post radioactive iodine therapy, followed  by Dr. Buddy Duty.  3. Stage II (T3 N0) colon cancer, status post a right  colectomy 10/19/2011, last colonoscopy April 2015-sigmoid adenoma removed.  4. History of a pulmonary embolism December 2012.  5. History of Atrial fibrillation 6. Iron deficiency anemia-new 03/18/2014. Hemoccult positive stool. The anemia corrected with iron. No longer taking iron.  Status post an upper endoscopy and colonoscopy by Dr. Carlean Purl April 2015 with no bleeding source identified, benign adenoma removed from the sigmoid colon. 7. Report of an upper gastric intestinal bleed fall 2016-managed in [place]. [name] 8. Left knee replacement May 2017, repeat left knee surgery May 2018 9. Pruritic rash 07/22/2016 10. Nausea and diarrhea 04/02/2019-stool negative for C. difficile toxin 11. 06/18/2019 [facility] admission for symptomatic anemia.   Disposition: Tracy Mclaughlin has non-Hodgkin's lymphoma.  She completed 2 treatments with CHOP/rituximab.  The restaging CT on 06/15/2019 revealed partial improvement in diffuse adenopathy.  The systemic therapy was complicated by increased malaise and symptomatic anemia.  The LDH remains elevated.  Tracy Mclaughlin will be placed on a treatment break.  She will return for office visit in 2 weeks.  We will discuss other systemic treatment options when she returns in 2 weeks.  She will contact us in the interim for new symptoms.  Betsy Coder, MD  06/29/2019  10:35 AM

## 2019-06-29 NOTE — Patient Instructions (Signed)

## 2019-07-12 ENCOUNTER — Inpatient Hospital Stay: Payer: Medicare PPO

## 2019-07-12 ENCOUNTER — Inpatient Hospital Stay (HOSPITAL_BASED_OUTPATIENT_CLINIC_OR_DEPARTMENT_OTHER): Payer: Medicare PPO | Admitting: Nurse Practitioner

## 2019-07-12 ENCOUNTER — Encounter: Payer: Self-pay | Admitting: Nurse Practitioner

## 2019-07-12 ENCOUNTER — Other Ambulatory Visit: Payer: Self-pay

## 2019-07-12 ENCOUNTER — Telehealth: Payer: Self-pay | Admitting: Oncology

## 2019-07-12 VITALS — BP 129/55 | HR 78 | Temp 97.7°F | Resp 18 | Ht 67.0 in | Wt 181.6 lb

## 2019-07-12 DIAGNOSIS — Z86711 Personal history of pulmonary embolism: Secondary | ICD-10-CM

## 2019-07-12 DIAGNOSIS — Z95828 Presence of other vascular implants and grafts: Secondary | ICD-10-CM

## 2019-07-12 DIAGNOSIS — D649 Anemia, unspecified: Secondary | ICD-10-CM

## 2019-07-12 DIAGNOSIS — C8510 Unspecified B-cell lymphoma, unspecified site: Secondary | ICD-10-CM

## 2019-07-12 DIAGNOSIS — R110 Nausea: Secondary | ICD-10-CM

## 2019-07-12 DIAGNOSIS — I4891 Unspecified atrial fibrillation: Secondary | ICD-10-CM | POA: Diagnosis not present

## 2019-07-12 DIAGNOSIS — C8261 Cutaneous follicle center lymphoma, lymph nodes of head, face, and neck: Secondary | ICD-10-CM

## 2019-07-12 DIAGNOSIS — C73 Malignant neoplasm of thyroid gland: Secondary | ICD-10-CM

## 2019-07-12 DIAGNOSIS — R740 Nonspecific elevation of levels of transaminase and lactic acid dehydrogenase [LDH]: Secondary | ICD-10-CM

## 2019-07-12 DIAGNOSIS — R5381 Other malaise: Secondary | ICD-10-CM

## 2019-07-12 DIAGNOSIS — C8590 Non-Hodgkin lymphoma, unspecified, unspecified site: Secondary | ICD-10-CM

## 2019-07-12 DIAGNOSIS — Z5112 Encounter for antineoplastic immunotherapy: Secondary | ICD-10-CM | POA: Diagnosis not present

## 2019-07-12 LAB — CMP (CANCER CENTER ONLY)
ALT: 13 U/L (ref 0–44)
AST: 38 U/L (ref 15–41)
Albumin: 3.2 g/dL — ABNORMAL LOW (ref 3.5–5.0)
Alkaline Phosphatase: 103 U/L (ref 38–126)
Anion gap: 8 (ref 5–15)
BUN: 13 mg/dL (ref 8–23)
CO2: 27 mmol/L (ref 22–32)
Calcium: 8.8 mg/dL — ABNORMAL LOW (ref 8.9–10.3)
Chloride: 105 mmol/L (ref 98–111)
Creatinine: 0.84 mg/dL (ref 0.44–1.00)
GFR, Est AFR Am: >60 mL/min (ref >60)
GFR, Estimated: >60 mL/min (ref >60)
Glucose, Bld: 108 mg/dL — ABNORMAL HIGH (ref 70–99)
Potassium: 4.7 mmol/L (ref 3.5–5.1)
Sodium: 140 mmol/L (ref 135–145)
Total Bilirubin: 0.7 mg/dL (ref 0.3–1.2)
Total Protein: 5.8 g/dL — ABNORMAL LOW (ref 6.5–8.1)

## 2019-07-12 LAB — CBC WITH DIFFERENTIAL (CANCER CENTER ONLY)
Abs Immature Granulocytes: 0.04 10*3/uL (ref 0.00–0.07)
Basophils Absolute: 0.1 10*3/uL (ref 0.0–0.1)
Basophils Relative: 1 %
Eosinophils Absolute: 0.2 10*3/uL (ref 0.0–0.5)
Eosinophils Relative: 2 %
HCT: 35.9 % — ABNORMAL LOW (ref 36.0–46.0)
Hemoglobin: 11.7 g/dL — ABNORMAL LOW (ref 12.0–15.0)
Immature Granulocytes: 1 %
Lymphocytes Relative: 24 %
Lymphs Abs: 1.9 10*3/uL (ref 0.7–4.0)
MCH: 31.5 pg (ref 26.0–34.0)
MCHC: 32.6 g/dL (ref 30.0–36.0)
MCV: 96.8 fL (ref 80.0–100.0)
Monocytes Absolute: 1.1 10*3/uL — ABNORMAL HIGH (ref 0.1–1.0)
Monocytes Relative: 14 %
Neutro Abs: 4.7 10*3/uL (ref 1.7–7.7)
Neutrophils Relative %: 58 %
Platelet Count: 196 10*3/uL (ref 150–400)
RBC: 3.71 MIL/uL — ABNORMAL LOW (ref 3.87–5.11)
RDW: 17.6 % — ABNORMAL HIGH (ref 11.5–15.5)
WBC Count: 7.9 10*3/uL (ref 4.0–10.5)
nRBC: 0 % (ref 0.0–0.2)

## 2019-07-12 LAB — LACTATE DEHYDROGENASE: LDH: 522 U/L — ABNORMAL HIGH (ref 98–192)

## 2019-07-12 MED ORDER — SODIUM CHLORIDE 0.9% FLUSH
10.0000 mL | Freq: Once | INTRAVENOUS | Status: AC
  Administered 2019-07-12: 10 mL
  Filled 2019-07-12: qty 10

## 2019-07-12 MED ORDER — HEPARIN SOD (PORK) LOCK FLUSH 100 UNIT/ML IV SOLN
500.0000 [IU] | Freq: Once | INTRAVENOUS | Status: AC
  Administered 2019-07-12: 11:00:00 500 [IU]
  Filled 2019-07-12: qty 5

## 2019-07-12 NOTE — Progress Notes (Addendum)
[place] OFFICE PROGRESS NOTE   Diagnosis: Non-Hodgkin's lymphoma  INTERVAL HISTORY:   Tracy Mclaughlin returns as scheduled.  Overall she feels better.  Some improvement in energy level.  She describes her appetite is "pretty good".  No fevers or sweats.  She has fairly constant mild nausea.  No vomiting.  Objective:  Vital signs in last 24 hours:  Blood pressure (!) 129/55, pulse 78, temperature 97.7 F (36.5 C), resp. rate 18, height 5\' 7"  (1.702 m), weight 181 lb 9.6 oz (82.4 kg), SpO2 97 %.    Limited physical examination due to COVID-19 distancing. Lymphatics: Several 1 to 1-1/2 cm left supraclavicular/low neck lymph nodes.  1 to 2 cm bilateral axillary lymph nodes. GI: Abdomen soft and nontender.  No hepatosplenomegaly. Vascular: No leg edema. Port-A-Cath without erythema.  Lab Results:  Lab Results  Component Value Date   WBC 7.9 07/12/2019   HGB 11.7 (L) 07/12/2019   HCT 35.9 (L) 07/12/2019   MCV 96.8 07/12/2019   PLT 196 07/12/2019   NEUTROABS 4.7 07/12/2019    Imaging:  No results found.  Medications: I have reviewed the patient's current medications.  Assessment/Plan: 1. 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 Status post weekly Rituxan x4 03/01/2012 through 03/22/2012. She completed 4 "maintenance" doses of Rituxan, last on 12/19/2012. A restaging CT on 02/09/2013 showed no evidence of lymphoma.   Lymph node lateral to the thyroid bed on a neck ultrasound 02/21/2014, status post an FNA biopsy concerning for a lymphoproliferative disorder.  PET scan 09/28/2016 with active lymphoma within the neck, chest, abdomen, pelvis; splenic enlargement and hypermetabolism suspicious for splenic involvement.  Initiation of Rituxan weekly 4 09/29/2016  Initiation of maintenance Rituxan on a 3 month schedule 12/23/2016; final Rituxan 08/31/2018   Thyroid ultrasound 02/07/2019-left cervical lymphadenopathy  PET scan 03/08/2019-extensive recurrent hypermetabolic lymphoma involving the neck, chest, abdomen and pelvis.  03/16/2019 left cervical lymph node biopsy-features consistent with previously diagnosed non-Hodgkin's B-cell lymphoma, phenotypically consistent with marginal zone lymphoma. Flow cytometry with lambda restricted B-cell population without expression of CD5 or CD10 comprising 87% of all lymphocytes.  Cycle 1 bendamustine/Rituxan 03/22/2019  Excision deep left axillary lymph nodes 05/04/2019-non-Hodgkin's B-cell lymphoma with differential including a marginal zone lymphoma and atypical small lymphocytic lymphoma. Flow cytometry with monoclonal B-cell population without expression of CD5 or CD10, comprises 96% of all lymphocytes.  Cycle 1 CHOP/Rituxan 05/18/2019  Cycle 2 CHOP/Rituxan 06/08/2019  CTs 06/15/2019-partial improvement in diffuse adenopathy, stable mild splenomegaly 2. Stage IV (T1bN1b M0) papillary thyroid cancer, status post a thyroidectomy with reimplantation of the left superior parathyroid gland on 05/23/2012, status post radioactive iodine therapy, followed by Dr. Buddy Duty.  3. Stage II (T3 N0) colon cancer, status post a right colectomy 10/19/2011, last colonoscopy April 2015-sigmoid adenoma removed.  4. History of a pulmonary embolism December 2012.  5. History of Atrial fibrillation 6. Iron deficiency anemia-new 03/18/2014. Hemoccult positive stool. The anemia corrected with iron. No longer taking iron.  Status post an upper endoscopy and colonoscopy by Dr. Carlean Purl April 2015 with no bleeding source identified, benign adenoma removed from the sigmoid colon. 7. Report of an upper gastric intestinal bleed fall 2016-managed in [place]. [name] 8. Left knee replacement May 2017, repeat left knee surgery May 2018 9. Pruritic rash 07/22/2016 10. Nausea and diarrhea 04/02/2019-stool negative for C. difficile toxin 11.  06/18/2019 [facility] admission for symptomatic anemia.  Disposition: Tracy Mclaughlin has completed 2  cycles of CHOP/Rituxan.  She has persistent peripheral adenopathy, the LDH remains elevated and there has been no significant improvement in her performance status.  Dr. Benay Spice recommends changing treatment to Revlimid/Rituxan.  She has had Rituxan in the past and is familiar with the potential side effects.  We reviewed potential toxicities associated with Revlimid including bone marrow toxicity, nausea, diarrhea, some increased risk for blood clots.  She agrees to proceed.  She will return in 1 week for Rituxan.  She will hopefully have Revlimid to begin cycle 1 in 1 week as well.  She will return for lab and follow-up in 3 weeks.  She will contact the office in the interim with any problems.  Patient seen with Dr. Benay Spice.  25 minutes were spent face-to-face at today's visit with the majority of that time involved in counseling/coordination of care.    Tracy Mclaughlin ANP/GNP-BC   07/12/2019  12:16 PM This was a shared visit with Tracy Mclaughlin.  Tracy Mclaughlin was interviewed and examined.  She appears unchanged.  There are persistent palpable lymph nodes.  She has malaise and the LDH is elevated.  We discussed salvage systemic therapy options.  We discussed referral for a second opinion.  She does not wish to have a second opinion.  There is data supporting the use of salvage therapy with lenalidomide and rituximab in patients with refractory low-grade lymphoma.  I recommend this regimen for Tracy Mclaughlin.  We reviewed potential toxicities associated with Revlimid.  She agrees to proceed.  Julieanne Manson, MD

## 2019-07-12 NOTE — Telephone Encounter (Signed)
Called patient. No voicemail. Will try again

## 2019-07-13 ENCOUNTER — Telehealth: Payer: Self-pay | Admitting: Pharmacist

## 2019-07-13 ENCOUNTER — Telehealth: Payer: Self-pay

## 2019-07-13 DIAGNOSIS — C8510 Unspecified B-cell lymphoma, unspecified site: Secondary | ICD-10-CM

## 2019-07-13 MED ORDER — LENALIDOMIDE 10 MG PO CAPS: 10.0000 mg | ORAL_CAPSULE | Freq: Every day | ORAL | 0 refills | Status: DC

## 2019-07-13 NOTE — Telephone Encounter (Addendum)
Authorization [id number]) given to [facility] in pharmacy to send prescription to start Ms. Croswell on Revlimid 10 mg.

## 2019-07-13 NOTE — Telephone Encounter (Signed)
Oral Oncology Pharmacist Encounter  Received new prescription for Revlimid (lenalidomide) for the treatment of previously treated marginal zone lymphoma in conjunction with rituximab, planned duration until disease progression or unacceptable toxicity.  Revlimid is planned to be administered at 10 mg once daily for 21 days on, 7 days off, repeated every 28 days.  Labs from 07/12/2019 assessed, OK for treatment initiation.  Current medication list in Epic reviewed, no DDIs with Revlimid identified. Will discuss the need for thromboprophylaxis with MD and will instruct patient to start aspirin 81 mg once daily if MD is agreeable.  Prescription for Revlimid will be e-scribed to appropriate specialty pharmacy for dispensing once insurance authorization is approved. Revlimid is not available for dispensing at the [facility] as it is a limited distribution medication.  Oral Oncology Clinic will continue to follow for insurance authorization, copayment issues, initial counseling and start date.  Johny Drilling, PharmD, BCPS, BCOP  07/13/2019 11:32 AM Oral Oncology Clinic [phone]

## 2019-07-13 NOTE — Telephone Encounter (Signed)
Oral Oncology Pharmacist Encounter  Submitted for Revlimid insurance authorization to [facility] Medicare Part D on Cover My Meds  Key: [id number] Status: approved Case ID: [id number] Effective dates: 07/13/2019-12/20/2019 PA dept ph: [phone]  Johny Drilling, PharmD, BCPS, BCOP  07/13/2019 12:39 PM Oral Oncology Clinic [phone]

## 2019-07-13 NOTE — Telephone Encounter (Signed)
Oral Oncology Pharmacist Encounter  Insurance authorization for Revlimid is approved. Received [employer] authorization number for Revlimid from collaborative practice LPN. Revlimid prescription has been e-scribed to [facility] specialty pharmacy. I received updated information that [employer] authorization was changed. Prescription re-sent to [facility] with updated [employer] auth#.  Supporting information including demographics, insurance card, insurance authorization, and medication/allergy lest has been faxed to dispensing pharmacy. I confirmed with pharmacy contact that they are able to fill medications for [facility].  I will reach out to the patient to perform initial counseling and provide information about dispensing pharmacy once I confirm pharmacy is actively processing patient's prescription.  Johny Drilling, PharmD, BCPS, BCOP  07/13/2019 12:30 PM Oral Oncology Clinic [phone]

## 2019-07-13 NOTE — Telephone Encounter (Signed)
-----   Message from Owens Shark, NP sent at 07/13/2019 10:01 AM EDT ----- Please contact pathology and ask for CLL FISH panel on lymph node biopsy from 05/04/2019

## 2019-07-13 NOTE — Telephone Encounter (Signed)
TC to pathology spoke with Butch Penny from [facility] to ask for CLL FISH panel on Tracy Mclaughlin that was done on 05/04/19

## 2019-07-15 ENCOUNTER — Other Ambulatory Visit: Payer: Self-pay | Admitting: Oncology

## 2019-07-16 NOTE — Telephone Encounter (Signed)
Oral Oncology Pharmacist Encounter  Received notification from [facility] that they are NOT able to process patient's Revlimid prescription. They have transferred script to [facility]. Oral oncology patient advocate verified directions and prescriber with [facility] pharmacy. We will continue to follow-up with dispensing pharmacy to ensure timely dispensing of medication.  I will reach out to the patient to perform initial counseling and provide information about dispensing pharmacy once I confirm pharmacy is actively processing patient's prescription.  Johny Drilling, PharmD, BCPS, BCOP  07/16/2019 10:02 AM Oral Oncology Clinic [phone]

## 2019-07-17 NOTE — Telephone Encounter (Signed)
Oral Oncology Patient Advocate Encounter  Tracy Mclaughlin is able to fill the Revlimid. The copay is $56.  I called the patient and spoke to her husband. I gave him this information and asked if he would like for me to see if I could find copay assistance for them and he stated that he "could handle the $56 copay just fine".  I told him that [facility] would be calling him to schedule shipment of the Revlimid. He thought that Revlimid would be filled at [facility]. I informed him that Revlimid was a limited distribution medication and [facility] does not carry it.  I also told him to expect a call from the oral oncology pharmacist to go over the Revlimid with him.  He verbalized understanding and great appreciation.  [place] Patient [place] Phone [phone] Fax [phone] 07/17/2019    3:24 PM

## 2019-07-18 ENCOUNTER — Telehealth: Payer: Self-pay

## 2019-07-18 ENCOUNTER — Other Ambulatory Visit (HOSPITAL_COMMUNITY)
Admission: RE | Admit: 2019-07-18 | Discharge: 2019-07-18 | Disposition: A | Discharge status: Home / Self Care | Payer: Medicare PPO | Source: Ambulatory Visit | Attending: Anatomic Pathology & Clinical Pathology | Admitting: Anatomic Pathology & Clinical Pathology

## 2019-07-18 NOTE — Telephone Encounter (Signed)
Received message from pharmacy staff that [facility] has been trying to reach pt but unable to leave vm on pt's phone.  Called pt and spoke with husband. He states she is sleeping.  Contact number and instructions to call [facility] given to husband regarding Revlimed prescription.

## 2019-07-18 NOTE — Telephone Encounter (Signed)
Oral Chemotherapy Pharmacist Encounter   I spoke with patient's husband, Arlen, for overview of: Revlimid (lenalidomide) for the treatment of previously treated marginal zone lymphoma in conjunction with rituximab, planned duration until disease progression or unacceptable toxicity.   Counseled on administration, dosing, side effects, monitoring, drug-food interactions, safe handling, storage, and disposal.  Patient will take Revlimid 10mg  capsules, 1 capsule by mouth once daily, without regard to food, with a full glass of water.  Revlimid will be given 21 days on, 7 days off, repeat every 28 days.  Revlimid start date: 07/20/19 Next rituximab dose 07/19/19  Adverse effects of Revlimid include but are not limited to: nausea, constipation, diarrhea, abdominal pain, rash, fatigue, drug fever, and decreased blood counts.    Mr. Proano stated patient is already extremely fatigued and tired. He states she sleeps 20 hours a day. They will continue to keep the office updated on energy status.  Reviewed importance of keeping a medication schedule and plan for any missed doses.  Medication reconciliation performed and medication/allergy list updated.  We discussed increased risk of blood clot associated with Revlimid use, as well as patient's history of previous clot and previous GI bleed. Patient will start on aspirin 81mg  once daily, enteric coated, for thromboprophylaxis. This has been added to patient's medication list.  Insurance authorization for Revlimid has been obtained.  Revlimid prescription is being dispensed from [facility] as it is a limited distribution medication. 1st fill of Revlimid is planned to be delivered to patient's home on Friday, 07/20/19.  All questions answered.  Mr. Brau voiced understanding and appreciation.   They know to call the office with questions or concerns.  Johny Drilling, PharmD, BCPS, BCOP  07/18/2019    2:47 PM Oral Oncology  Clinic [phone]

## 2019-07-19 ENCOUNTER — Other Ambulatory Visit: Payer: Self-pay

## 2019-07-19 ENCOUNTER — Inpatient Hospital Stay: Payer: Medicare PPO

## 2019-07-19 VITALS — BP 119/46 | HR 63 | Temp 98.4°F | Resp 18

## 2019-07-19 DIAGNOSIS — C8510 Unspecified B-cell lymphoma, unspecified site: Secondary | ICD-10-CM

## 2019-07-19 DIAGNOSIS — Z5112 Encounter for antineoplastic immunotherapy: Secondary | ICD-10-CM | POA: Diagnosis not present

## 2019-07-19 MED ORDER — HEPARIN SOD (PORK) LOCK FLUSH 100 UNIT/ML IV SOLN
500.0000 [IU] | Freq: Once | INTRAVENOUS | Status: AC | PRN
  Administered 2019-07-19: 500 [IU]
  Filled 2019-07-19: qty 5

## 2019-07-19 MED ORDER — SODIUM CHLORIDE 0.9 % IV SOLN
Freq: Once | INTRAVENOUS | Status: AC
  Administered 2019-07-19: 12:00:00 via INTRAVENOUS
  Filled 2019-07-19: qty 250

## 2019-07-19 MED ORDER — ACETAMINOPHEN 325 MG PO TABS
ORAL_TABLET | ORAL | Status: AC
  Filled 2019-07-19: qty 2

## 2019-07-19 MED ORDER — FAMOTIDINE IN NACL 20-0.9 MG/50ML-% IV SOLN
INTRAVENOUS | Status: AC
  Filled 2019-07-19: qty 50

## 2019-07-19 MED ORDER — DIPHENHYDRAMINE HCL 25 MG PO CAPS
50.0000 mg | ORAL_CAPSULE | Freq: Once | ORAL | Status: AC
  Administered 2019-07-19: 12:00:00 50 mg via ORAL

## 2019-07-19 MED ORDER — DIPHENHYDRAMINE HCL 25 MG PO CAPS
ORAL_CAPSULE | ORAL | Status: AC
  Filled 2019-07-19: qty 2

## 2019-07-19 MED ORDER — SODIUM CHLORIDE 0.9 % IV SOLN
375.0000 mg/m2 | Freq: Once | INTRAVENOUS | Status: AC
  Administered 2019-07-19: 14:00:00 700 mg via INTRAVENOUS
  Filled 2019-07-19: qty 50

## 2019-07-19 MED ORDER — FAMOTIDINE IN NACL 20-0.9 MG/50ML-% IV SOLN
20.0000 mg | Freq: Once | INTRAVENOUS | Status: AC
  Administered 2019-07-19: 20 mg via INTRAVENOUS

## 2019-07-19 MED ORDER — METHYLPREDNISOLONE SODIUM SUCC 125 MG IJ SOLR
125.0000 mg | Freq: Once | INTRAMUSCULAR | Status: AC
  Administered 2019-07-19: 125 mg via INTRAVENOUS

## 2019-07-19 MED ORDER — SODIUM CHLORIDE 0.9% FLUSH
10.0000 mL | INTRAVENOUS | Status: DC | PRN
  Administered 2019-07-19: 17:00:00 10 mL
  Filled 2019-07-19: qty 10

## 2019-07-19 MED ORDER — METHYLPREDNISOLONE SODIUM SUCC 125 MG IJ SOLR
INTRAMUSCULAR | Status: AC
  Filled 2019-07-19: qty 2

## 2019-07-19 MED ORDER — ACETAMINOPHEN 325 MG PO TABS
650.0000 mg | ORAL_TABLET | Freq: Once | ORAL | Status: AC
  Administered 2019-07-19: 650 mg via ORAL

## 2019-07-19 NOTE — Patient Instructions (Signed)
[facility] Discharge Instructions for Patients Receiving Chemotherapy  Today you received the following chemotherapy agents:  Rituxan.  To help prevent nausea and vomiting after your treatment, we encourage you to take your nausea medication as directed.   If you develop nausea and vomiting that is not controlled by your nausea medication, call the clinic.   BELOW ARE SYMPTOMS THAT SHOULD BE REPORTED IMMEDIATELY:  *FEVER GREATER THAN 100.5 F  *CHILLS WITH OR WITHOUT FEVER  NAUSEA AND VOMITING THAT IS NOT CONTROLLED WITH YOUR NAUSEA MEDICATION  *UNUSUAL SHORTNESS OF BREATH  *UNUSUAL BRUISING OR BLEEDING  TENDERNESS IN MOUTH AND THROAT WITH OR WITHOUT PRESENCE OF ULCERS  *URINARY PROBLEMS  *BOWEL PROBLEMS  UNUSUAL RASH Items with * indicate a potential emergency and should be followed up as soon as possible.  Feel free to call the clinic should you have any questions or concerns. The clinic phone number is [phone].  Please show the CHEMO ALERT CARD at check-in to the Emergency Department and triage nurse.   

## 2019-07-19 NOTE — Progress Notes (Signed)
Per Dr. Benay Spice, okay to proceed with subsequent rituxan today with labs from 07/12/2019.

## 2019-07-23 NOTE — Telephone Encounter (Signed)
Oral Oncology Patient Advocate Encounter  The patients husband called and confirmed that the Revlimid was delivered on 7/31.  [place] Patient [place] Phone [phone] Fax [phone] 07/23/2019   9:27 AM

## 2019-07-26 ENCOUNTER — Encounter (HOSPITAL_COMMUNITY): Payer: Self-pay

## 2019-08-01 ENCOUNTER — Other Ambulatory Visit: Payer: Self-pay | Admitting: Nurse Practitioner

## 2019-08-01 DIAGNOSIS — C8510 Unspecified B-cell lymphoma, unspecified site: Secondary | ICD-10-CM

## 2019-08-02 ENCOUNTER — Inpatient Hospital Stay: Payer: Medicare PPO | Attending: Oncology | Admitting: Nurse Practitioner

## 2019-08-02 ENCOUNTER — Encounter: Payer: Self-pay | Admitting: Nurse Practitioner

## 2019-08-02 ENCOUNTER — Inpatient Hospital Stay: Payer: Medicare PPO

## 2019-08-02 ENCOUNTER — Telehealth: Payer: Self-pay | Admitting: Nurse Practitioner

## 2019-08-02 ENCOUNTER — Other Ambulatory Visit: Payer: Self-pay

## 2019-08-02 VITALS — BP 110/60 | HR 85 | Temp 98.5°F | Resp 18 | Ht 67.0 in | Wt 178.8 lb

## 2019-08-02 DIAGNOSIS — D696 Thrombocytopenia, unspecified: Secondary | ICD-10-CM | POA: Insufficient documentation

## 2019-08-02 DIAGNOSIS — C73 Malignant neoplasm of thyroid gland: Secondary | ICD-10-CM

## 2019-08-02 DIAGNOSIS — R61 Generalized hyperhidrosis: Secondary | ICD-10-CM | POA: Insufficient documentation

## 2019-08-02 DIAGNOSIS — Z5112 Encounter for antineoplastic immunotherapy: Secondary | ICD-10-CM | POA: Insufficient documentation

## 2019-08-02 DIAGNOSIS — C8510 Unspecified B-cell lymphoma, unspecified site: Secondary | ICD-10-CM

## 2019-08-02 DIAGNOSIS — C8261 Cutaneous follicle center lymphoma, lymph nodes of head, face, and neck: Secondary | ICD-10-CM | POA: Diagnosis present

## 2019-08-02 DIAGNOSIS — C8267 Cutaneous follicle center lymphoma, spleen: Secondary | ICD-10-CM | POA: Diagnosis present

## 2019-08-02 DIAGNOSIS — Z95828 Presence of other vascular implants and grafts: Secondary | ICD-10-CM

## 2019-08-02 DIAGNOSIS — Z79899 Other long term (current) drug therapy: Secondary | ICD-10-CM | POA: Insufficient documentation

## 2019-08-02 LAB — CMP (CANCER CENTER ONLY)
ALT: 23 U/L (ref 0–44)
AST: 35 U/L (ref 15–41)
Albumin: 3.4 g/dL — ABNORMAL LOW (ref 3.5–5.0)
Alkaline Phosphatase: 117 U/L (ref 38–126)
Anion gap: 11 (ref 5–15)
BUN: 15 mg/dL (ref 8–23)
CO2: 22 mmol/L (ref 22–32)
Calcium: 8.7 mg/dL — ABNORMAL LOW (ref 8.9–10.3)
Chloride: 106 mmol/L (ref 98–111)
Creatinine: 0.92 mg/dL (ref 0.44–1.00)
GFR, Est AFR Am: >60 mL/min (ref >60)
GFR, Estimated: 58 mL/min — ABNORMAL LOW (ref >60)
Glucose, Bld: 99 mg/dL (ref 70–99)
Potassium: 3.9 mmol/L (ref 3.5–5.1)
Sodium: 139 mmol/L (ref 135–145)
Total Bilirubin: 1 mg/dL (ref 0.3–1.2)
Total Protein: 5.8 g/dL — ABNORMAL LOW (ref 6.5–8.1)

## 2019-08-02 LAB — LACTATE DEHYDROGENASE: LDH: 303 U/L — ABNORMAL HIGH (ref 98–192)

## 2019-08-02 LAB — CBC WITH DIFFERENTIAL (CANCER CENTER ONLY)
Abs Immature Granulocytes: 0.03 10*3/uL (ref 0.00–0.07)
Basophils Absolute: 0 10*3/uL (ref 0.0–0.1)
Basophils Relative: 1 %
Eosinophils Absolute: 0.3 10*3/uL (ref 0.0–0.5)
Eosinophils Relative: 4 %
HCT: 34.3 % — ABNORMAL LOW (ref 36.0–46.0)
Hemoglobin: 11.3 g/dL — ABNORMAL LOW (ref 12.0–15.0)
Immature Granulocytes: 1 %
Lymphocytes Relative: 52 %
Lymphs Abs: 3.3 10*3/uL (ref 0.7–4.0)
MCH: 31.6 pg (ref 26.0–34.0)
MCHC: 32.9 g/dL (ref 30.0–36.0)
MCV: 95.8 fL (ref 80.0–100.0)
Monocytes Absolute: 0.6 10*3/uL (ref 0.1–1.0)
Monocytes Relative: 10 %
Neutro Abs: 2.1 10*3/uL (ref 1.7–7.7)
Neutrophils Relative %: 32 %
Platelet Count: 105 10*3/uL — ABNORMAL LOW (ref 150–400)
RBC: 3.58 MIL/uL — ABNORMAL LOW (ref 3.87–5.11)
RDW: 15.4 % (ref 11.5–15.5)
WBC Count: 6.3 10*3/uL (ref 4.0–10.5)
nRBC: 0 % (ref 0.0–0.2)

## 2019-08-02 MED ORDER — HEPARIN SOD (PORK) LOCK FLUSH 100 UNIT/ML IV SOLN
500.0000 [IU] | Freq: Once | INTRAVENOUS | Status: AC
  Administered 2019-08-02: 500 [IU]
  Filled 2019-08-02: qty 5

## 2019-08-02 MED ORDER — SODIUM CHLORIDE 0.9% FLUSH
10.0000 mL | Freq: Once | INTRAVENOUS | Status: AC
  Administered 2019-08-02: 10 mL
  Filled 2019-08-02: qty 10

## 2019-08-02 NOTE — Progress Notes (Addendum)
[place] OFFICE PROGRESS NOTE   Diagnosis: Non-Hodgkin's lymphoma  INTERVAL HISTORY:   [name] returns as scheduled.  She completed cycle 1 every 4-week rituxan on 07/19/2019. She began Revlimid 10 mg daily 21 days on/7 days off 07/20/2019.    She feels "good".  Main complaint is poor energy.  No fever.  She has periodic sweats, mainly at nighttime, estimated at 2 times per week.  She has a good appetite.  No nausea or vomiting.  No mouth sores.  No diarrhea.  She fell at home about a week ago.  She is now ambulating with a walker.  Objective:  Vital signs in last 24 hours:  Blood pressure 110/60, pulse 85, temperature 98.5 F (36.9 C), temperature source Oral, resp. rate 18, height 5\' 7"  (1.702 m), weight 178 lb 12.8 oz (81.1 kg), SpO2 99 %.    Lymphatics: Small left supraclavicular/lower neck lymph nodes, less discrete than on previous examinations.  Small, less than 1 cm bilateral axillary lymph nodes. GI: Abdomen soft and nontender.  No hepatosplenomegaly. Vascular: No leg edema. Neuro: Alert and oriented. Skin: Healing abrasion left knee. Port-A-Cath without erythema.   Lab Results:  Lab Results  Component Value Date   WBC 6.3 08/02/2019   HGB 11.3 (L) 08/02/2019   HCT 34.3 (L) 08/02/2019   MCV 95.8 08/02/2019   PLT 105 (L) 08/02/2019   NEUTROABS 2.1 08/02/2019    Imaging:  No results found.  Medications: I have reviewed the patient's current medications.  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 Status post weekly Rituxan x4 03/01/2012 through 03/22/2012. She completed 4 "maintenance" doses of Rituxan, last on 12/19/2012. A restaging CT on 02/09/2013 showed no evidence of lymphoma.   Lymph node lateral to the thyroid bed on a neck ultrasound 02/21/2014, status post an FNA biopsy concerning for a lymphoproliferative  disorder.  PET scan 09/28/2016 with active lymphoma within the neck, chest, abdomen, pelvis; splenic enlargement and hypermetabolism suspicious for splenic involvement.  Initiation of Rituxan weekly 4 09/29/2016  Initiation of maintenance Rituxan on a 3 month schedule 12/23/2016; final Rituxan 08/31/2018  Thyroid ultrasound 02/07/2019-left cervical lymphadenopathy  PET scan 03/08/2019-extensive recurrent hypermetabolic lymphoma involving the neck, chest, abdomen and pelvis.  03/16/2019 left cervical lymph node biopsy-features consistent with previously diagnosed non-Hodgkin's B-cell lymphoma, phenotypically consistent with marginal zone lymphoma. Flow cytometry with lambda restricted B-cell population without expression of CD5 or CD10 comprising 87% of all lymphocytes.  Cycle 1 bendamustine/Rituxan 03/22/2019  Excision deep left axillary lymph nodes 05/04/2019-non-Hodgkin's B-cell lymphoma with differential including a marginal zone lymphoma and atypical small lymphocytic lymphoma. Flow cytometry with monoclonal B-cell population without expression of CD5 or CD10, comprises 96% of all lymphocytes.  Cycle 1 CHOP/Rituxan 05/18/2019  Cycle 2 CHOP/Rituxan 06/08/2019  CTs 06/15/2019-partial improvement in diffuse adenopathy, stable mild splenomegaly 2. Stage IV (T1bN1b M0) papillary thyroid cancer, status post a thyroidectomy with reimplantation of the left superior parathyroid gland on 05/23/2012, status post radioactive iodine therapy, followed by Dr. Buddy Duty.  3. Stage II (T3 N0) colon cancer, status post a right colectomy 10/19/2011, last colonoscopy April 2015-sigmoid adenoma removed.  4. History of a pulmonary embolism December 2012.  5. History of Atrial fibrillation 6. Iron deficiency anemia-new 03/18/2014. Hemoccult positive stool. The anemia corrected with iron. No longer taking iron.  Status post an upper endoscopy and colonoscopy by Dr. Carlean Purl April 2015 with no bleeding source  identified, benign  adenoma removed from the sigmoid colon. 7. Report of an upper gastric intestinal bleed fall 2016-managed in [place]. [name] 8. Left knee replacement May 2017, repeat left knee surgery May 2018 9. Pruritic rash 07/22/2016 10. Nausea and diarrhea 04/02/2019-stool negative for C. difficile toxin 11. 06/18/2019 [facility] admission for symptomatic anemia.  Disposition: Tracy Mclaughlin appears stable.  She is completing cycle 1 Revlimid/Rituxan.  Overall she is tolerating well.  We reviewed the CBC from today.  She has mild thrombocytopenia.  She will contact the office with bleeding.  She will return for lab, follow-up, Rituxan in 2 weeks.  She will contact the office in the interim with any problems.  Patient seen with Dr. Benay Spice.    Ned Card ANP/GNP-BC   08/02/2019  2:23 PM  This was a shared visit with Ned Card.  [name] appears to be tolerating the Revlimid well.  Her overall performance status has improved.  Julieanne Manson, MD

## 2019-08-02 NOTE — Telephone Encounter (Signed)
Scheduled appt per 8/13 los - gave patient AVS and calender per los.  

## 2019-08-03 LAB — THYROID PANEL WITH TSH
Free Thyroxine Index: 3.9 (ref 1.2–4.9)
T3 Uptake Ratio: 29 % (ref 24–39)
T4, Total: 13.6 ug/dL — ABNORMAL HIGH (ref 4.5–12.0)
TSH: 1.25 u[IU]/mL (ref 0.450–4.500)

## 2019-08-10 ENCOUNTER — Other Ambulatory Visit: Payer: Self-pay | Admitting: *Deleted

## 2019-08-10 DIAGNOSIS — C8510 Unspecified B-cell lymphoma, unspecified site: Secondary | ICD-10-CM

## 2019-08-10 MED ORDER — LENALIDOMIDE 10 MG PO CAPS: 10.0000 mg | ORAL_CAPSULE | Freq: Every day | ORAL | 0 refills | Status: DC

## 2019-08-10 NOTE — Progress Notes (Signed)
Faxed refill request received from [facility]: Refill approved and e-scribed. Start date due 08/17/19

## 2019-08-12 ENCOUNTER — Other Ambulatory Visit: Payer: Self-pay | Admitting: Oncology

## 2019-08-16 ENCOUNTER — Inpatient Hospital Stay (HOSPITAL_BASED_OUTPATIENT_CLINIC_OR_DEPARTMENT_OTHER): Payer: Medicare PPO | Admitting: Oncology

## 2019-08-16 ENCOUNTER — Other Ambulatory Visit: Payer: Self-pay

## 2019-08-16 ENCOUNTER — Inpatient Hospital Stay: Payer: Medicare PPO

## 2019-08-16 ENCOUNTER — Telehealth: Payer: Self-pay | Admitting: Oncology

## 2019-08-16 VITALS — BP 123/47 | HR 58 | Temp 98.2°F | Resp 16

## 2019-08-16 VITALS — BP 123/41 | HR 73 | Temp 97.8°F | Resp 24 | Wt 184.2 lb

## 2019-08-16 DIAGNOSIS — C8510 Unspecified B-cell lymphoma, unspecified site: Secondary | ICD-10-CM

## 2019-08-16 DIAGNOSIS — C73 Malignant neoplasm of thyroid gland: Secondary | ICD-10-CM

## 2019-08-16 DIAGNOSIS — Z5112 Encounter for antineoplastic immunotherapy: Secondary | ICD-10-CM | POA: Diagnosis not present

## 2019-08-16 DIAGNOSIS — Z95828 Presence of other vascular implants and grafts: Secondary | ICD-10-CM

## 2019-08-16 LAB — CBC WITH DIFFERENTIAL (CANCER CENTER ONLY)
Abs Immature Granulocytes: 0.03 10*3/uL (ref 0.00–0.07)
Basophils Absolute: 0.1 10*3/uL (ref 0.0–0.1)
Basophils Relative: 2 %
Eosinophils Absolute: 0.3 10*3/uL (ref 0.0–0.5)
Eosinophils Relative: 6 %
HCT: 36.7 % (ref 36.0–46.0)
Hemoglobin: 11.8 g/dL — ABNORMAL LOW (ref 12.0–15.0)
Immature Granulocytes: 1 %
Lymphocytes Relative: 44 %
Lymphs Abs: 2.4 10*3/uL (ref 0.7–4.0)
MCH: 31.1 pg (ref 26.0–34.0)
MCHC: 32.2 g/dL (ref 30.0–36.0)
MCV: 96.6 fL (ref 80.0–100.0)
Monocytes Absolute: 0.7 10*3/uL (ref 0.1–1.0)
Monocytes Relative: 14 %
Neutro Abs: 1.7 10*3/uL (ref 1.7–7.7)
Neutrophils Relative %: 33 %
Platelet Count: 143 10*3/uL — ABNORMAL LOW (ref 150–400)
RBC: 3.8 MIL/uL — ABNORMAL LOW (ref 3.87–5.11)
RDW: 14 % (ref 11.5–15.5)
WBC Count: 5.2 10*3/uL (ref 4.0–10.5)
nRBC: 0 % (ref 0.0–0.2)

## 2019-08-16 LAB — CMP (CANCER CENTER ONLY)
ALT: 20 U/L (ref 0–44)
AST: 38 U/L (ref 15–41)
Albumin: 3.4 g/dL — ABNORMAL LOW (ref 3.5–5.0)
Alkaline Phosphatase: 109 U/L (ref 38–126)
Anion gap: 9 (ref 5–15)
BUN: 14 mg/dL (ref 8–23)
CO2: 24 mmol/L (ref 22–32)
Calcium: 8.6 mg/dL — ABNORMAL LOW (ref 8.9–10.3)
Chloride: 106 mmol/L (ref 98–111)
Creatinine: 0.93 mg/dL (ref 0.44–1.00)
GFR, Est AFR Am: >60 mL/min (ref >60)
GFR, Estimated: 57 mL/min — ABNORMAL LOW (ref >60)
Glucose, Bld: 98 mg/dL (ref 70–99)
Potassium: 4.3 mmol/L (ref 3.5–5.1)
Sodium: 139 mmol/L (ref 135–145)
Total Bilirubin: 0.9 mg/dL (ref 0.3–1.2)
Total Protein: 5.9 g/dL — ABNORMAL LOW (ref 6.5–8.1)

## 2019-08-16 LAB — LACTATE DEHYDROGENASE: LDH: 392 U/L — ABNORMAL HIGH (ref 98–192)

## 2019-08-16 MED ORDER — FAMOTIDINE IN NACL 20-0.9 MG/50ML-% IV SOLN
INTRAVENOUS | Status: AC
  Filled 2019-08-16: qty 50

## 2019-08-16 MED ORDER — FAMOTIDINE IN NACL 20-0.9 MG/50ML-% IV SOLN
20.0000 mg | Freq: Once | INTRAVENOUS | Status: AC
  Administered 2019-08-16: 20 mg via INTRAVENOUS

## 2019-08-16 MED ORDER — ACETAMINOPHEN 325 MG PO TABS
650.0000 mg | ORAL_TABLET | Freq: Once | ORAL | Status: AC
  Administered 2019-08-16: 650 mg via ORAL

## 2019-08-16 MED ORDER — METHYLPREDNISOLONE SODIUM SUCC 125 MG IJ SOLR
INTRAMUSCULAR | Status: AC
  Filled 2019-08-16: qty 2

## 2019-08-16 MED ORDER — ACETAMINOPHEN 325 MG PO TABS
ORAL_TABLET | ORAL | Status: AC
  Filled 2019-08-16: qty 2

## 2019-08-16 MED ORDER — SODIUM CHLORIDE 0.9 % IV SOLN
375.0000 mg/m2 | Freq: Once | INTRAVENOUS | Status: AC
  Administered 2019-08-16: 10:00:00 700 mg via INTRAVENOUS
  Filled 2019-08-16: qty 50

## 2019-08-16 MED ORDER — METHYLPREDNISOLONE SODIUM SUCC 125 MG IJ SOLR
125.0000 mg | Freq: Once | INTRAMUSCULAR | Status: AC
  Administered 2019-08-16: 125 mg via INTRAVENOUS

## 2019-08-16 MED ORDER — DIPHENHYDRAMINE HCL 25 MG PO CAPS
50.0000 mg | ORAL_CAPSULE | Freq: Once | ORAL | Status: AC
  Administered 2019-08-16: 50 mg via ORAL

## 2019-08-16 MED ORDER — DIPHENHYDRAMINE HCL 25 MG PO CAPS
ORAL_CAPSULE | ORAL | Status: AC
  Filled 2019-08-16: qty 2

## 2019-08-16 MED ORDER — SODIUM CHLORIDE 0.9% FLUSH
10.0000 mL | Freq: Once | INTRAVENOUS | Status: AC
  Administered 2019-08-16: 10 mL
  Filled 2019-08-16: qty 10

## 2019-08-16 MED ORDER — SODIUM CHLORIDE 0.9 % IV SOLN
Freq: Once | INTRAVENOUS | Status: AC
  Administered 2019-08-16: 09:00:00 via INTRAVENOUS
  Filled 2019-08-16: qty 250

## 2019-08-16 MED ORDER — SODIUM CHLORIDE 0.9% FLUSH
10.0000 mL | INTRAVENOUS | Status: DC | PRN
  Administered 2019-08-16: 13:00:00 10 mL
  Filled 2019-08-16: qty 10

## 2019-08-16 MED ORDER — HEPARIN SOD (PORK) LOCK FLUSH 100 UNIT/ML IV SOLN
500.0000 [IU] | Freq: Once | INTRAVENOUS | Status: AC | PRN
  Administered 2019-08-16: 500 [IU]
  Filled 2019-08-16: qty 5

## 2019-08-16 NOTE — Progress Notes (Signed)
[place] OFFICE PROGRESS NOTE   Diagnosis: Non-Hodgkin's lymphoma  INTERVAL HISTORY:   Tracy Mclaughlin returns as scheduled.  She completed cycle 1 Revlimid.  She reports feeling well.  No fever or sweats.  Good appetite.  No nausea or diarrhea.  No peripheral numbness.  Objective:  Vital signs in last 24 hours:  Blood pressure (!) 123/41, pulse 73, temperature 97.8 F (36.6 C), temperature source Oral, resp. rate (!) 24, weight 184 lb 3.2 oz (83.6 kg), SpO2 98 %.    Lymphatics: 1/2-1 cm bilateral (left.  Right) cervical and supraclavicular nodes.  2 cm left axillary node.  No inguinal nodes. Resp: Lungs clear bilaterally Cardio: Regular rate and rhythm GI: No hepatosplenomegaly Vascular: The left lower leg is larger than the right side, no erythema or tenderness    Portacath/PICC-without erythema  Lab Results:  Lab Results  Component Value Date   WBC 5.2 08/16/2019   HGB 11.8 (L) 08/16/2019   HCT 36.7 08/16/2019   MCV 96.6 08/16/2019   PLT 143 (L) 08/16/2019   NEUTROABS 1.7 08/16/2019    CMP  Lab Results  Component Value Date   NA 139 08/16/2019   K 4.3 08/16/2019   CL 106 08/16/2019   CO2 24 08/16/2019   GLUCOSE 98 08/16/2019   BUN 14 08/16/2019   CREATININE 0.93 08/16/2019   CALCIUM 8.6 (L) 08/16/2019   PROT 5.9 (L) 08/16/2019   ALBUMIN 3.4 (L) 08/16/2019   AST 38 08/16/2019   ALT 20 08/16/2019   ALKPHOS 109 08/16/2019   BILITOT 0.9 08/16/2019   GFRNONAA 57 (L) 08/16/2019   GFRAA >60 08/16/2019     Medications: I have reviewed the patient's current medications.   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 Status post weekly Rituxan x4 03/01/2012 through 03/22/2012. She completed 4 "maintenance" doses of Rituxan, last on 12/19/2012. A restaging CT on 02/09/2013 showed no evidence of lymphoma.   Lymph node  lateral to the thyroid bed on a neck ultrasound 02/21/2014, status post an FNA biopsy concerning for a lymphoproliferative disorder.  PET scan 09/28/2016 with active lymphoma within the neck, chest, abdomen, pelvis; splenic enlargement and hypermetabolism suspicious for splenic involvement.  Initiation of Rituxan weekly 4 09/29/2016  Initiation of maintenance Rituxan on a 3 month schedule 12/23/2016; final Rituxan 08/31/2018  Thyroid ultrasound 02/07/2019-left cervical lymphadenopathy  PET scan 03/08/2019-extensive recurrent hypermetabolic lymphoma involving the neck, chest, abdomen and pelvis.  03/16/2019 left cervical lymph node biopsy-features consistent with previously diagnosed non-Hodgkin's B-cell lymphoma, phenotypically consistent with marginal zone lymphoma. Flow cytometry with lambda restricted B-cell population without expression of CD5 or CD10 comprising 87% of all lymphocytes.  Cycle 1 bendamustine/Rituxan 03/22/2019  Excision deep left axillary lymph nodes 05/04/2019-non-Hodgkin's B-cell lymphoma with differential including a marginal zone lymphoma and atypical small lymphocytic lymphoma. Flow cytometry with monoclonal B-cell population without expression of CD5 or CD10, comprises 96% of all lymphocytes.  Cycle 1 CHOP/Rituxan 05/18/2019  Cycle 2 CHOP/Rituxan 06/08/2019  CTs 06/15/2019-partial improvement in diffuse adenopathy, stable mild splenomegaly  Cycle 1 Revlimid/rituximab 07/19/2019 (Revlimid start 07/20/2019)  Cycle 2 Revlimid/rituximab 08/16/2019 2. Stage IV (T1bN1b M0) papillary thyroid cancer, status post a thyroidectomy with reimplantation of the left superior parathyroid gland on 05/23/2012, status post radioactive iodine therapy, followed by Dr. Buddy Duty.  3. Stage II (T3 N0) colon cancer, status post a right colectomy 10/19/2011, last colonoscopy April 2015-sigmoid adenoma removed.  4. History of a  pulmonary embolism December 2012.  5. History of Atrial  fibrillation 6. Iron deficiency anemia-new 03/18/2014. Hemoccult positive stool. The anemia corrected with iron. No longer taking iron.  Status post an upper endoscopy and colonoscopy by Dr. Carlean Purl April 2015 with no bleeding source identified, benign adenoma removed from the sigmoid colon. 7. Report of an upper gastric intestinal bleed fall 2016-managed in [place]. [name] 8. Left knee replacement May 2017, repeat left knee surgery May 2018 9. Pruritic rash 07/22/2016 10. Nausea and diarrhea 04/02/2019-stool negative for C. difficile toxin 11. 06/18/2019 [facility] admission for symptomatic anemia.    Disposition: Tracy Mclaughlin appears well.  She will begin cycle 2 rituximab/Revlimid today.  Revlimid will start tomorrow.  She will return for an outpatient lab visit in 4 weeks.  The neutrophil count is borderline low.  She will return for a CBC in 2 weeks.  Tracy Coder, MD  08/16/2019  8:42 AM

## 2019-08-16 NOTE — Telephone Encounter (Signed)
Called and left msg. Mailed printout  °

## 2019-08-22 ENCOUNTER — Telehealth: Payer: Self-pay | Admitting: Nurse Practitioner

## 2019-08-22 NOTE — Telephone Encounter (Signed)
I talk with patient regarding 9/10

## 2019-08-30 ENCOUNTER — Inpatient Hospital Stay: Payer: Medicare PPO

## 2019-08-30 ENCOUNTER — Other Ambulatory Visit: Payer: Self-pay

## 2019-08-30 ENCOUNTER — Telehealth: Payer: Self-pay | Admitting: *Deleted

## 2019-08-30 ENCOUNTER — Inpatient Hospital Stay: Payer: Medicare PPO | Attending: Oncology

## 2019-08-30 DIAGNOSIS — Z5112 Encounter for antineoplastic immunotherapy: Secondary | ICD-10-CM | POA: Diagnosis not present

## 2019-08-30 DIAGNOSIS — C8267 Cutaneous follicle center lymphoma, spleen: Secondary | ICD-10-CM | POA: Diagnosis present

## 2019-08-30 DIAGNOSIS — C8510 Unspecified B-cell lymphoma, unspecified site: Secondary | ICD-10-CM

## 2019-08-30 DIAGNOSIS — Z95828 Presence of other vascular implants and grafts: Secondary | ICD-10-CM

## 2019-08-30 DIAGNOSIS — C8261 Cutaneous follicle center lymphoma, lymph nodes of head, face, and neck: Secondary | ICD-10-CM | POA: Insufficient documentation

## 2019-08-30 DIAGNOSIS — C73 Malignant neoplasm of thyroid gland: Secondary | ICD-10-CM

## 2019-08-30 LAB — CBC WITH DIFFERENTIAL (CANCER CENTER ONLY)
Abs Immature Granulocytes: 0.03 10*3/uL (ref 0.00–0.07)
Basophils Absolute: 0.1 10*3/uL (ref 0.0–0.1)
Basophils Relative: 2 %
Eosinophils Absolute: 0.5 10*3/uL (ref 0.0–0.5)
Eosinophils Relative: 11 %
HCT: 37.4 % (ref 36.0–46.0)
Hemoglobin: 12.2 g/dL (ref 12.0–15.0)
Immature Granulocytes: 1 %
Lymphocytes Relative: 64 %
Lymphs Abs: 2.8 10*3/uL (ref 0.7–4.0)
MCH: 30.8 pg (ref 26.0–34.0)
MCHC: 32.6 g/dL (ref 30.0–36.0)
MCV: 94.4 fL (ref 80.0–100.0)
Monocytes Absolute: 0.3 10*3/uL (ref 0.1–1.0)
Monocytes Relative: 7 %
Neutro Abs: 0.6 10*3/uL — ABNORMAL LOW (ref 1.7–7.7)
Neutrophils Relative %: 15 %
Platelet Count: 104 10*3/uL — ABNORMAL LOW (ref 150–400)
RBC: 3.96 MIL/uL (ref 3.87–5.11)
RDW: 13.6 % (ref 11.5–15.5)
WBC Count: 4.3 10*3/uL (ref 4.0–10.5)
nRBC: 0 % (ref 0.0–0.2)

## 2019-08-30 MED ORDER — HEPARIN SOD (PORK) LOCK FLUSH 100 UNIT/ML IV SOLN
500.0000 [IU] | Freq: Once | INTRAVENOUS | Status: AC
  Administered 2019-08-30: 500 [IU]
  Filled 2019-08-30: qty 5

## 2019-08-30 MED ORDER — SODIUM CHLORIDE 0.9% FLUSH
10.0000 mL | Freq: Once | INTRAVENOUS | Status: AC
  Administered 2019-08-30: 10 mL
  Filled 2019-08-30: qty 10

## 2019-08-30 NOTE — Telephone Encounter (Signed)
Notified of CBC results and neutropenia. Instructed to hold Revlimid till she is seen again. Call for fever or s/s of infection. F/U as scheduled. She currently denies any s/s of infection.

## 2019-08-30 NOTE — Progress Notes (Signed)
Pt came in today for labs and port flush port flushed no blood return. Pt. Could not stay for cathflo her husband was waiting for her outside. Peripheral labs drawn.

## 2019-08-30 NOTE — Telephone Encounter (Signed)
-----   Message from [name], MD sent at 08/30/2019  3:45 PM EDT ----- Please call patient, hold the Revlimid, call for a fever, follow-up as scheduled

## 2019-09-04 ENCOUNTER — Telehealth: Payer: Self-pay

## 2019-09-04 NOTE — Telephone Encounter (Signed)
[place] [phone]) and spoke with Courtney-Pharm Tech to inform them that patient's Revlimid needs to be held until 09/13/19. Courtney instructed that [employer] would contact on 09/14/19 with a reminder to resume medication if they have not heard back from Dr. Benay Spice before then.

## 2019-09-09 ENCOUNTER — Other Ambulatory Visit: Payer: Self-pay | Admitting: Oncology

## 2019-09-13 ENCOUNTER — Inpatient Hospital Stay: Payer: Medicare PPO

## 2019-09-13 ENCOUNTER — Other Ambulatory Visit: Payer: Self-pay | Admitting: *Deleted

## 2019-09-13 ENCOUNTER — Other Ambulatory Visit: Payer: Self-pay

## 2019-09-13 ENCOUNTER — Encounter: Payer: Self-pay | Admitting: Nurse Practitioner

## 2019-09-13 ENCOUNTER — Inpatient Hospital Stay (HOSPITAL_BASED_OUTPATIENT_CLINIC_OR_DEPARTMENT_OTHER): Payer: Medicare PPO | Admitting: Nurse Practitioner

## 2019-09-13 VITALS — BP 126/51 | HR 89 | Temp 97.8°F | Resp 18 | Ht 67.0 in | Wt 183.1 lb

## 2019-09-13 VITALS — BP 122/54 | HR 67 | Temp 98.1°F | Resp 16

## 2019-09-13 DIAGNOSIS — C73 Malignant neoplasm of thyroid gland: Secondary | ICD-10-CM

## 2019-09-13 DIAGNOSIS — Z95828 Presence of other vascular implants and grafts: Secondary | ICD-10-CM

## 2019-09-13 DIAGNOSIS — C8510 Unspecified B-cell lymphoma, unspecified site: Secondary | ICD-10-CM

## 2019-09-13 DIAGNOSIS — C8590 Non-Hodgkin lymphoma, unspecified, unspecified site: Secondary | ICD-10-CM | POA: Diagnosis not present

## 2019-09-13 DIAGNOSIS — Z5112 Encounter for antineoplastic immunotherapy: Secondary | ICD-10-CM | POA: Diagnosis not present

## 2019-09-13 LAB — CBC WITH DIFFERENTIAL (CANCER CENTER ONLY)
Abs Immature Granulocytes: 0.03 10*3/uL (ref 0.00–0.07)
Basophils Absolute: 0.1 10*3/uL (ref 0.0–0.1)
Basophils Relative: 2 %
Eosinophils Absolute: 0.6 10*3/uL — ABNORMAL HIGH (ref 0.0–0.5)
Eosinophils Relative: 9 %
HCT: 40.7 % (ref 36.0–46.0)
Hemoglobin: 13.5 g/dL (ref 12.0–15.0)
Immature Granulocytes: 1 %
Lymphocytes Relative: 53 %
Lymphs Abs: 3.4 10*3/uL (ref 0.7–4.0)
MCH: 31 pg (ref 26.0–34.0)
MCHC: 33.2 g/dL (ref 30.0–36.0)
MCV: 93.6 fL (ref 80.0–100.0)
Monocytes Absolute: 0.5 10*3/uL (ref 0.1–1.0)
Monocytes Relative: 8 %
Neutro Abs: 1.8 10*3/uL (ref 1.7–7.7)
Neutrophils Relative %: 27 %
Platelet Count: 147 10*3/uL — ABNORMAL LOW (ref 150–400)
RBC: 4.35 MIL/uL (ref 3.87–5.11)
RDW: 13.8 % (ref 11.5–15.5)
WBC Count: 6.4 10*3/uL (ref 4.0–10.5)
nRBC: 0 % (ref 0.0–0.2)

## 2019-09-13 LAB — CMP (CANCER CENTER ONLY)
ALT: 12 U/L (ref 0–44)
AST: 34 U/L (ref 15–41)
Albumin: 3.4 g/dL — ABNORMAL LOW (ref 3.5–5.0)
Alkaline Phosphatase: 116 U/L (ref 38–126)
Anion gap: 9 (ref 5–15)
BUN: 11 mg/dL (ref 8–23)
CO2: 26 mmol/L (ref 22–32)
Calcium: 8.7 mg/dL — ABNORMAL LOW (ref 8.9–10.3)
Chloride: 106 mmol/L (ref 98–111)
Creatinine: 0.87 mg/dL (ref 0.44–1.00)
GFR, Est AFR Am: >60 mL/min (ref >60)
GFR, Estimated: >60 mL/min (ref >60)
Glucose, Bld: 105 mg/dL — ABNORMAL HIGH (ref 70–99)
Potassium: 4.2 mmol/L (ref 3.5–5.1)
Sodium: 141 mmol/L (ref 135–145)
Total Bilirubin: 0.8 mg/dL (ref 0.3–1.2)
Total Protein: 6.1 g/dL — ABNORMAL LOW (ref 6.5–8.1)

## 2019-09-13 LAB — LACTATE DEHYDROGENASE: LDH: 410 U/L — ABNORMAL HIGH (ref 98–192)

## 2019-09-13 MED ORDER — ACETAMINOPHEN 325 MG PO TABS
ORAL_TABLET | ORAL | Status: AC
  Filled 2019-09-13: qty 2

## 2019-09-13 MED ORDER — ACETAMINOPHEN 325 MG PO TABS
650.0000 mg | ORAL_TABLET | Freq: Once | ORAL | Status: AC
  Administered 2019-09-13: 650 mg via ORAL

## 2019-09-13 MED ORDER — SODIUM CHLORIDE 0.9 % IV SOLN
Freq: Once | INTRAVENOUS | Status: AC
  Administered 2019-09-13: 10:00:00 via INTRAVENOUS
  Filled 2019-09-13: qty 250

## 2019-09-13 MED ORDER — LENALIDOMIDE 10 MG PO CAPS: 10.0000 mg | ORAL_CAPSULE | Freq: Every day | ORAL | 0 refills | Status: DC

## 2019-09-13 MED ORDER — METHYLPREDNISOLONE SODIUM SUCC 125 MG IJ SOLR
125.0000 mg | Freq: Once | INTRAMUSCULAR | Status: AC
  Administered 2019-09-13: 10:00:00 125 mg via INTRAVENOUS

## 2019-09-13 MED ORDER — SODIUM CHLORIDE 0.9 % IV SOLN
375.0000 mg/m2 | Freq: Once | INTRAVENOUS | Status: AC
  Administered 2019-09-13: 700 mg via INTRAVENOUS
  Filled 2019-09-13: qty 50

## 2019-09-13 MED ORDER — DIPHENHYDRAMINE HCL 25 MG PO CAPS
50.0000 mg | ORAL_CAPSULE | Freq: Once | ORAL | Status: AC
  Administered 2019-09-13: 10:00:00 50 mg via ORAL

## 2019-09-13 MED ORDER — SODIUM CHLORIDE 0.9% FLUSH
10.0000 mL | INTRAVENOUS | Status: DC | PRN
  Administered 2019-09-13: 10 mL
  Filled 2019-09-13: qty 10

## 2019-09-13 MED ORDER — SODIUM CHLORIDE 0.9% FLUSH
10.0000 mL | Freq: Once | INTRAVENOUS | Status: AC
  Administered 2019-09-13: 09:00:00 10 mL
  Filled 2019-09-13: qty 10

## 2019-09-13 MED ORDER — FAMOTIDINE IN NACL 20-0.9 MG/50ML-% IV SOLN
20.0000 mg | Freq: Once | INTRAVENOUS | Status: AC
  Administered 2019-09-13: 10:00:00 20 mg via INTRAVENOUS

## 2019-09-13 MED ORDER — FAMOTIDINE IN NACL 20-0.9 MG/50ML-% IV SOLN
INTRAVENOUS | Status: AC
  Filled 2019-09-13: qty 50

## 2019-09-13 MED ORDER — DIPHENHYDRAMINE HCL 25 MG PO CAPS
ORAL_CAPSULE | ORAL | Status: AC
  Filled 2019-09-13: qty 2

## 2019-09-13 MED ORDER — HEPARIN SOD (PORK) LOCK FLUSH 100 UNIT/ML IV SOLN
500.0000 [IU] | Freq: Once | INTRAVENOUS | Status: AC | PRN
  Administered 2019-09-13: 14:00:00 500 [IU]
  Filled 2019-09-13: qty 5

## 2019-09-13 MED ORDER — METHYLPREDNISOLONE SODIUM SUCC 125 MG IJ SOLR
INTRAMUSCULAR | Status: AC
  Filled 2019-09-13: qty 2

## 2019-09-13 NOTE — Patient Instructions (Signed)
[facility] Discharge Instructions for Patients Receiving Chemotherapy  Today you received the following chemotherapy agents:  Rituxan.  To help prevent nausea and vomiting after your treatment, we encourage you to take your nausea medication as directed.   If you develop nausea and vomiting that is not controlled by your nausea medication, call the clinic.   BELOW ARE SYMPTOMS THAT SHOULD BE REPORTED IMMEDIATELY:  *FEVER GREATER THAN 100.5 F  *CHILLS WITH OR WITHOUT FEVER  NAUSEA AND VOMITING THAT IS NOT CONTROLLED WITH YOUR NAUSEA MEDICATION  *UNUSUAL SHORTNESS OF BREATH  *UNUSUAL BRUISING OR BLEEDING  TENDERNESS IN MOUTH AND THROAT WITH OR WITHOUT PRESENCE OF ULCERS  *URINARY PROBLEMS  *BOWEL PROBLEMS  UNUSUAL RASH Items with * indicate a potential emergency and should be followed up as soon as possible.  Feel free to call the clinic should you have any questions or concerns. The clinic phone number is [phone].  Please show the CHEMO ALERT CARD at check-in to the Emergency Department and triage nurse.   

## 2019-09-13 NOTE — Progress Notes (Signed)
[facility] OFFICE PROGRESS NOTE   Diagnosis: Non-Hodgkin's lymphoma  INTERVAL HISTORY:   Tracy Mclaughlin returns as scheduled.  She completed cycle 2 Rituxan 08/16/2019, Revlimid beginning 08/17/2019.  Revlimid was placed on hold beginning 08/30/2019 due to neutropenia.  No signs of reaction with the Rituxan infusion.  Appetite varies.  No fevers or sweats.  She is not sure if lymph nodes have changed in size.  She has stable dyspnea on exertion and a stable cough.  No nausea, vomiting, diarrhea.  She denies pain.  Objective:  Vital signs in last 24 hours:  Blood pressure (!) 126/51, pulse 89, temperature 97.8 F (36.6 C), temperature source Oral, resp. rate 18, height 5\' 7"  (1.702 m), weight 183 lb 1.6 oz (83.1 kg), SpO2 96 %.    Lymphatics: Small bilateral anterior cervical lymph nodes.  Approximate 1 cm left axillary lymph node. GI: Abdomen soft and nontender.  No hepatosplenomegaly. Vascular: No leg edema.  Left lower leg appears slightly larger than the right lower leg. Neuro: Alert and oriented. Skin: No rash. Port-A-Cath without erythema.   Lab Results:  Lab Results  Component Value Date   WBC 6.4 09/13/2019   HGB 13.5 09/13/2019   HCT 40.7 09/13/2019   MCV 93.6 09/13/2019   PLT 147 (L) 09/13/2019   NEUTROABS PENDING 09/13/2019    Imaging:  No results found.  Medications: I have reviewed the patient's current medications.  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 Status post weekly Rituxan x4 03/01/2012 through 03/22/2012. She completed 4 "maintenance" doses of Rituxan, last on 12/19/2012. A restaging CT on 02/09/2013 showed no evidence of lymphoma.   Lymph node lateral to the thyroid bed on a neck ultrasound 02/21/2014, status post an FNA biopsy concerning for a lymphoproliferative disorder.  PET scan 09/28/2016 with active  lymphoma within the neck, chest, abdomen, pelvis; splenic enlargement and hypermetabolism suspicious for splenic involvement.  Initiation of Rituxan weekly 4 09/29/2016  Initiation of maintenance Rituxan on a 3 month schedule 12/23/2016; final Rituxan 08/31/2018  Thyroid ultrasound 02/07/2019-left cervical lymphadenopathy  PET scan 03/08/2019-extensive recurrent hypermetabolic lymphoma involving the neck, chest, abdomen and pelvis.  03/16/2019 left cervical lymph node biopsy-features consistent with previously diagnosed non-Hodgkin's B-cell lymphoma, phenotypically consistent with marginal zone lymphoma. Flow cytometry with lambda restricted B-cell population without expression of CD5 or CD10 comprising 87% of all lymphocytes.  Cycle 1 bendamustine/Rituxan 03/22/2019  Excision deep left axillary lymph nodes 05/04/2019-non-Hodgkin's B-cell lymphoma with differential including a marginal zone lymphoma and atypical small lymphocytic lymphoma. Flow cytometry with monoclonal B-cell population without expression of CD5 or CD10, comprises 96% of all lymphocytes.  Cycle 1 CHOP/Rituxan 05/18/2019  Cycle 2 CHOP/Rituxan 06/08/2019  CTs 06/15/2019-partial improvement in diffuse adenopathy, stable mild splenomegaly  Cycle 1 Revlimid/rituximab 07/19/2019 (Revlimid start 07/20/2019)  Cycle 2 Revlimid/rituximab 08/16/2019 (Revlimid placed on hold 08/30/2019 due to neutropenia)  Cycle 3 of Revlimid/rituximab 09/13/2019 (Revlimid schedule changed to 14 days on/14 days off) 2. Stage IV (T1bN1b M0) papillary thyroid cancer, status post a thyroidectomy with reimplantation of the left superior parathyroid gland on 05/23/2012, status post radioactive iodine therapy, followed by Dr. Buddy Duty.  3. Stage II (T3 N0) colon cancer, status post a right colectomy 10/19/2011, last colonoscopy April 2015-sigmoid adenoma removed.  4. History of a pulmonary embolism December 2012.  5. History of Atrial fibrillation 6. Iron  deficiency anemia-new 03/18/2014. Hemoccult positive stool. The anemia corrected with iron. No longer  taking iron.  Status post an upper endoscopy and colonoscopy by Dr. Carlean Purl April 2015 with no bleeding source identified, benign adenoma removed from the sigmoid colon. 7. Report of an upper gastric intestinal bleed fall 2016-managed in [place]. [name] 8. Left knee replacement May 2017, repeat left knee surgery May 2018 9. Pruritic rash 07/22/2016 10. Nausea and diarrhea 04/02/2019-stool negative for C. difficile toxin 11. 06/18/2019 [facility] admission for symptomatic anemia.   Disposition: Tracy Mclaughlin appears stable.  Performance status continues to be improved.  She has completed 2 cycles of Revlimid/Rituxan.  Plan to proceed with cycle 3 today as scheduled.  Due to neutropenia with cycle to the Revlimid schedule will be adjusted to 14 days on/14 days off.  We reviewed the labs from today.  Counts are adequate to proceed with treatment.  LDH continues to be elevated, significance unclear.  We will continue to monitor.  She will return for lab, follow-up, Rituxan in 4 weeks.  She will contact the office in the interim with any problems.  We specifically discussed fever, chills, other signs of infection.  Plan reviewed with Dr. Benay Spice.  25 minutes were spent face-to-face at today's visit with the majority of that time involved in counseling/coordination of care.    Ned Card ANP/GNP-BC   09/13/2019  9:21 AM

## 2019-09-14 ENCOUNTER — Telehealth: Payer: Self-pay | Admitting: Oncology

## 2019-09-14 NOTE — Telephone Encounter (Signed)
Called and spoke with patients husband. Confirmed appt

## 2019-09-26 ENCOUNTER — Telehealth: Payer: Self-pay | Admitting: *Deleted

## 2019-09-26 NOTE — Telephone Encounter (Signed)
Patient left message asking when she is to resume her Revlimid. Called back and spoke with husband (she was taking a nap) and confirmed she took the Revlimid from 9/24 and last pill was on 10/04 (ran out). Informed him she is not due to start next cycle until day of her next appointment, which is 10/10/19

## 2019-10-07 ENCOUNTER — Other Ambulatory Visit: Payer: Self-pay | Admitting: Oncology

## 2019-10-10 ENCOUNTER — Inpatient Hospital Stay: Payer: Medicare PPO

## 2019-10-10 ENCOUNTER — Other Ambulatory Visit: Payer: Self-pay

## 2019-10-10 ENCOUNTER — Inpatient Hospital Stay: Payer: Medicare PPO | Attending: Oncology | Admitting: Oncology

## 2019-10-10 VITALS — BP 125/67 | HR 77 | Temp 98.6°F | Resp 18

## 2019-10-10 VITALS — BP 138/60 | HR 100 | Temp 98.2°F | Resp 18 | Ht 67.0 in | Wt 180.9 lb

## 2019-10-10 DIAGNOSIS — C8510 Unspecified B-cell lymphoma, unspecified site: Secondary | ICD-10-CM | POA: Diagnosis not present

## 2019-10-10 DIAGNOSIS — C8590 Non-Hodgkin lymphoma, unspecified, unspecified site: Secondary | ICD-10-CM

## 2019-10-10 DIAGNOSIS — C8261 Cutaneous follicle center lymphoma, lymph nodes of head, face, and neck: Secondary | ICD-10-CM | POA: Diagnosis present

## 2019-10-10 DIAGNOSIS — C73 Malignant neoplasm of thyroid gland: Secondary | ICD-10-CM

## 2019-10-10 DIAGNOSIS — Z95828 Presence of other vascular implants and grafts: Secondary | ICD-10-CM

## 2019-10-10 DIAGNOSIS — Z5112 Encounter for antineoplastic immunotherapy: Secondary | ICD-10-CM | POA: Insufficient documentation

## 2019-10-10 DIAGNOSIS — C8267 Cutaneous follicle center lymphoma, spleen: Secondary | ICD-10-CM | POA: Diagnosis present

## 2019-10-10 LAB — CBC WITH DIFFERENTIAL (CANCER CENTER ONLY)
Abs Immature Granulocytes: 0.07 10*3/uL (ref 0.00–0.07)
Basophils Absolute: 0.1 10*3/uL (ref 0.0–0.1)
Basophils Relative: 1 %
Eosinophils Absolute: 0.5 10*3/uL (ref 0.0–0.5)
Eosinophils Relative: 7 %
HCT: 41.2 % (ref 36.0–46.0)
Hemoglobin: 13.8 g/dL (ref 12.0–15.0)
Immature Granulocytes: 1 %
Lymphocytes Relative: 47 %
Lymphs Abs: 3.3 10*3/uL (ref 0.7–4.0)
MCH: 30.9 pg (ref 26.0–34.0)
MCHC: 33.5 g/dL (ref 30.0–36.0)
MCV: 92.2 fL (ref 80.0–100.0)
Monocytes Absolute: 0.5 10*3/uL (ref 0.1–1.0)
Monocytes Relative: 6 %
Neutro Abs: 2.8 10*3/uL (ref 1.7–7.7)
Neutrophils Relative %: 38 %
Platelet Count: 140 10*3/uL — ABNORMAL LOW (ref 150–400)
RBC: 4.47 MIL/uL (ref 3.87–5.11)
RDW: 14.8 % (ref 11.5–15.5)
WBC Count: 7.2 10*3/uL (ref 4.0–10.5)
nRBC: 0 % (ref 0.0–0.2)

## 2019-10-10 LAB — CMP (CANCER CENTER ONLY)
ALT: 11 U/L (ref 0–44)
AST: 33 U/L (ref 15–41)
Albumin: 3.4 g/dL — ABNORMAL LOW (ref 3.5–5.0)
Alkaline Phosphatase: 111 U/L (ref 38–126)
Anion gap: 12 (ref 5–15)
BUN: 12 mg/dL (ref 8–23)
CO2: 25 mmol/L (ref 22–32)
Calcium: 8.9 mg/dL (ref 8.9–10.3)
Chloride: 106 mmol/L (ref 98–111)
Creatinine: 0.88 mg/dL (ref 0.44–1.00)
GFR, Est AFR Am: >60 mL/min (ref >60)
GFR, Estimated: >60 mL/min (ref >60)
Glucose, Bld: 108 mg/dL — ABNORMAL HIGH (ref 70–99)
Potassium: 4.6 mmol/L (ref 3.5–5.1)
Sodium: 143 mmol/L (ref 135–145)
Total Bilirubin: 0.9 mg/dL (ref 0.3–1.2)
Total Protein: 6.2 g/dL — ABNORMAL LOW (ref 6.5–8.1)

## 2019-10-10 LAB — LACTATE DEHYDROGENASE: LDH: 434 U/L — ABNORMAL HIGH (ref 98–192)

## 2019-10-10 MED ORDER — FAMOTIDINE IN NACL 20-0.9 MG/50ML-% IV SOLN
INTRAVENOUS | Status: AC
  Filled 2019-10-10: qty 50

## 2019-10-10 MED ORDER — HEPARIN SOD (PORK) LOCK FLUSH 100 UNIT/ML IV SOLN
500.0000 [IU] | Freq: Once | INTRAVENOUS | Status: AC | PRN
  Administered 2019-10-10: 500 [IU]
  Filled 2019-10-10: qty 5

## 2019-10-10 MED ORDER — SODIUM CHLORIDE 0.9% FLUSH
10.0000 mL | INTRAVENOUS | Status: DC | PRN
  Administered 2019-10-10: 10 mL
  Filled 2019-10-10: qty 10

## 2019-10-10 MED ORDER — METHYLPREDNISOLONE SODIUM SUCC 125 MG IJ SOLR
INTRAMUSCULAR | Status: AC
  Filled 2019-10-10: qty 2

## 2019-10-10 MED ORDER — DIPHENHYDRAMINE HCL 25 MG PO CAPS
ORAL_CAPSULE | ORAL | Status: AC
  Filled 2019-10-10: qty 2

## 2019-10-10 MED ORDER — DIPHENHYDRAMINE HCL 25 MG PO CAPS
50.0000 mg | ORAL_CAPSULE | Freq: Once | ORAL | Status: AC
  Administered 2019-10-10: 50 mg via ORAL

## 2019-10-10 MED ORDER — ACETAMINOPHEN 325 MG PO TABS
ORAL_TABLET | ORAL | Status: AC
  Filled 2019-10-10: qty 2

## 2019-10-10 MED ORDER — SODIUM CHLORIDE 0.9% FLUSH
10.0000 mL | Freq: Once | INTRAVENOUS | Status: AC
  Administered 2019-10-10: 10 mL
  Filled 2019-10-10: qty 10

## 2019-10-10 MED ORDER — DIPHENHYDRAMINE HCL 25 MG PO CAPS
ORAL_CAPSULE | ORAL | Status: AC
  Filled 2019-10-10: qty 1

## 2019-10-10 MED ORDER — METHYLPREDNISOLONE SODIUM SUCC 125 MG IJ SOLR
125.0000 mg | Freq: Once | INTRAMUSCULAR | Status: AC
  Administered 2019-10-10: 125 mg via INTRAVENOUS

## 2019-10-10 MED ORDER — SODIUM CHLORIDE 0.9 % IV SOLN
375.0000 mg/m2 | Freq: Once | INTRAVENOUS | Status: AC
  Administered 2019-10-10: 700 mg via INTRAVENOUS
  Filled 2019-10-10: qty 20

## 2019-10-10 MED ORDER — LENALIDOMIDE 10 MG PO CAPS: 10.0000 mg | ORAL_CAPSULE | Freq: Every day | ORAL | 0 refills | Status: DC

## 2019-10-10 MED ORDER — FAMOTIDINE IN NACL 20-0.9 MG/50ML-% IV SOLN
20.0000 mg | Freq: Once | INTRAVENOUS | Status: AC
  Administered 2019-10-10: 20 mg via INTRAVENOUS

## 2019-10-10 MED ORDER — ACETAMINOPHEN 325 MG PO TABS
650.0000 mg | ORAL_TABLET | Freq: Once | ORAL | Status: AC
  Administered 2019-10-10: 650 mg via ORAL

## 2019-10-10 MED ORDER — SODIUM CHLORIDE 0.9 % IV SOLN
Freq: Once | INTRAVENOUS | Status: AC
  Administered 2019-10-10: 10:00:00 via INTRAVENOUS
  Filled 2019-10-10: qty 250

## 2019-10-10 NOTE — Patient Instructions (Signed)

## 2019-10-10 NOTE — Patient Instructions (Signed)
[facility] Discharge Instructions for Patients Receiving Chemotherapy  Today you received the following chemotherapy agents:  Rituxan.  To help prevent nausea and vomiting after your treatment, we encourage you to take your nausea medication as directed.   If you develop nausea and vomiting that is not controlled by your nausea medication, call the clinic.   BELOW ARE SYMPTOMS THAT SHOULD BE REPORTED IMMEDIATELY:  *FEVER GREATER THAN 100.5 F  *CHILLS WITH OR WITHOUT FEVER  NAUSEA AND VOMITING THAT IS NOT CONTROLLED WITH YOUR NAUSEA MEDICATION  *UNUSUAL SHORTNESS OF BREATH  *UNUSUAL BRUISING OR BLEEDING  TENDERNESS IN MOUTH AND THROAT WITH OR WITHOUT PRESENCE OF ULCERS  *URINARY PROBLEMS  *BOWEL PROBLEMS  UNUSUAL RASH Items with * indicate a potential emergency and should be followed up as soon as possible.  Feel free to call the clinic should you have any questions or concerns. The clinic phone number is [phone].  Please show the CHEMO ALERT CARD at check-in to the Emergency Department and triage nurse.   

## 2019-10-10 NOTE — Progress Notes (Signed)
[place] OFFICE PROGRESS NOTE   Diagnosis: Non-Hodgkin's lymphoma  INTERVAL HISTORY:   Tracy Mclaughlin completed another cycle of rituximab and Revlimid beginning 09/13/2019.  She reports tolerating the treatment well.  No symptoms of allergic reaction.  Diarrhea, or symptom of thrombosis.  She reports a good appetite.  No change in palpable lymph nodes.  No fever or night sweats.  Objective:  Vital signs in last 24 hours:  Blood pressure 138/60, pulse 100, temperature 98.2 F (36.8 C), temperature source Temporal, resp. rate 18, height 5\' 7"  (1.702 m), weight 180 lb 14.4 oz (82.1 kg), SpO2 97 %.    Lymphatics: 1 cm bilateral cervical/scalene, 1.5 cm left supraclavicular 2-3 cm left axillary centimeter right axillary nodes Resp: Lungs clear bilaterally Cardio: Regular rate and rhythm with premature beats GI: No hepatosplenomegaly Vascular: No leg edema    Portacath/PICC-without erythema  Lab Results:  Lab Results  Component Value Date   WBC 7.2 10/10/2019   HGB 13.8 10/10/2019   HCT 41.2 10/10/2019   MCV 92.2 10/10/2019   PLT 140 (L) 10/10/2019   NEUTROABS 2.8 10/10/2019    CMP  Lab Results  Component Value Date   NA 143 10/10/2019   K 4.6 10/10/2019   CL 106 10/10/2019   CO2 25 10/10/2019   GLUCOSE 108 (H) 10/10/2019   BUN 12 10/10/2019   CREATININE 0.88 10/10/2019   CALCIUM 8.9 10/10/2019   PROT 6.2 (L) 10/10/2019   ALBUMIN 3.4 (L) 10/10/2019   AST 33 10/10/2019   ALT 11 10/10/2019   ALKPHOS 111 10/10/2019   BILITOT 0.9 10/10/2019   GFRNONAA >60 10/10/2019   GFRAA >60 10/10/2019   [id number]  Medications: I have reviewed the patient's current medications.   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 Status post weekly Rituxan x4 03/01/2012 through 03/22/2012. She completed 4 "maintenance" doses of Rituxan, last on  12/19/2012. A restaging CT on 02/09/2013 showed no evidence of lymphoma.   Lymph node lateral to the thyroid bed on a neck ultrasound 02/21/2014, status post an FNA biopsy concerning for a lymphoproliferative disorder.  PET scan 09/28/2016 with active lymphoma within the neck, chest, abdomen, pelvis; splenic enlargement and hypermetabolism suspicious for splenic involvement.  Initiation of Rituxan weekly 4 09/29/2016  Initiation of maintenance Rituxan on a 3 month schedule 12/23/2016; final Rituxan 08/31/2018  Thyroid ultrasound 02/07/2019-left cervical lymphadenopathy  PET scan 03/08/2019-extensive recurrent hypermetabolic lymphoma involving the neck, chest, abdomen and pelvis.  03/16/2019 left cervical lymph node biopsy-features consistent with previously diagnosed non-Hodgkin's B-cell lymphoma, phenotypically consistent with marginal zone lymphoma. Flow cytometry with lambda restricted B-cell population without expression of CD5 or CD10 comprising 87% of all lymphocytes.  Cycle 1 bendamustine/Rituxan 03/22/2019  Excision deep left axillary lymph nodes 05/04/2019-non-Hodgkin's B-cell lymphoma with differential including a marginal zone lymphoma and atypical small lymphocytic lymphoma. Flow cytometry with monoclonal B-cell population without expression of CD5 or CD10, comprises 96% of all lymphocytes.  Cycle 1 CHOP/Rituxan 05/18/2019  Cycle 2 CHOP/Rituxan 06/08/2019  CTs 06/15/2019-partial improvement in diffuse adenopathy, stable mild splenomegaly  Cycle 1 Revlimid/rituximab 07/19/2019 (Revlimid start 07/20/2019)  Cycle 2 Revlimid/rituximab 08/16/2019 (Revlimid placed on hold 08/30/2019 due to neutropenia)  Cycle 3 of Revlimid/rituximab 09/13/2019 (Revlimid schedule changed to 14 days on/14 days off)  Cycle 4 Revlimid/rituximab 10/10/2019 2. Stage IV (T1bN1b M0) papillary thyroid cancer, status post a thyroidectomy with reimplantation of the left superior parathyroid gland on 05/23/2012,  status post radioactive iodine therapy, followed by Dr. Buddy Duty.  3. Stage II (T3 N0) colon cancer, status post a right colectomy 10/19/2011, last colonoscopy April 2015-sigmoid adenoma removed.  4. History of a pulmonary embolism December 2012.  5. History of Atrial fibrillation 6. Iron deficiency anemia-new 03/18/2014. Hemoccult positive stool. The anemia corrected with iron. No longer taking iron.  Status post an upper endoscopy and colonoscopy by Dr. Carlean Purl April 2015 with no bleeding source identified, benign adenoma removed from the sigmoid colon. 7. Report of an upper gastric intestinal bleed fall 2016-managed in [place]. [name] 8. Left knee replacement May 2017, repeat left knee surgery May 2018 9. Pruritic rash 07/22/2016 10. Nausea and diarrhea 04/02/2019-stool negative for C. difficile toxin 11. 06/18/2019 [facility] admission for symptomatic anemia.    Disposition: Tracy Mclaughlin is tolerating the Revlimid/rituximab well.  She will complete cycle 4 beginning today.  The LDH is stable.  She is stable from a hematologic standpoint.  There is no clinical evidence for progression of the lymphoma.  She will return for an office visit and rituximab in 1 month.  Betsy Coder, MD  10/10/2019  9:54 AM

## 2019-10-11 ENCOUNTER — Telehealth: Payer: Self-pay | Admitting: Oncology

## 2019-10-11 NOTE — Telephone Encounter (Signed)
I talk with patient regarding schedule  

## 2019-10-12 ENCOUNTER — Telehealth: Payer: Self-pay | Admitting: *Deleted

## 2019-10-12 NOTE — Telephone Encounter (Addendum)
Message from specialty pharmacy that they have been trying to reach patient without success. Request she call them at [phone] option 1 to speak with nurse to arrange delivery. Called patient home and had to leave VM. Also sent Mychart message with phone #. Husband called again and was provided the number to call [facility].

## 2019-10-29 ENCOUNTER — Other Ambulatory Visit: Payer: Self-pay | Admitting: *Deleted

## 2019-10-29 DIAGNOSIS — C8510 Unspecified B-cell lymphoma, unspecified site: Secondary | ICD-10-CM

## 2019-10-29 MED ORDER — LENALIDOMIDE 10 MG PO CAPS: 10.0000 mg | ORAL_CAPSULE | Freq: Every day | ORAL | 0 refills | Status: DC

## 2019-11-04 ENCOUNTER — Other Ambulatory Visit: Payer: Self-pay | Admitting: Oncology

## 2019-11-07 ENCOUNTER — Inpatient Hospital Stay: Payer: Medicare PPO | Attending: Oncology

## 2019-11-07 ENCOUNTER — Inpatient Hospital Stay (HOSPITAL_BASED_OUTPATIENT_CLINIC_OR_DEPARTMENT_OTHER): Payer: Medicare PPO | Admitting: Oncology

## 2019-11-07 ENCOUNTER — Inpatient Hospital Stay: Payer: Medicare PPO

## 2019-11-07 ENCOUNTER — Other Ambulatory Visit: Payer: Self-pay

## 2019-11-07 VITALS — BP 147/55 | HR 68 | Temp 98.9°F | Resp 18 | Ht 67.0 in | Wt 182.7 lb

## 2019-11-07 VITALS — BP 125/40 | HR 58 | Temp 98.7°F | Resp 16

## 2019-11-07 DIAGNOSIS — C8261 Cutaneous follicle center lymphoma, lymph nodes of head, face, and neck: Secondary | ICD-10-CM | POA: Insufficient documentation

## 2019-11-07 DIAGNOSIS — Z5112 Encounter for antineoplastic immunotherapy: Secondary | ICD-10-CM | POA: Diagnosis not present

## 2019-11-07 DIAGNOSIS — C8267 Cutaneous follicle center lymphoma, spleen: Secondary | ICD-10-CM | POA: Insufficient documentation

## 2019-11-07 DIAGNOSIS — Z95828 Presence of other vascular implants and grafts: Secondary | ICD-10-CM

## 2019-11-07 DIAGNOSIS — C8510 Unspecified B-cell lymphoma, unspecified site: Secondary | ICD-10-CM | POA: Diagnosis not present

## 2019-11-07 DIAGNOSIS — C73 Malignant neoplasm of thyroid gland: Secondary | ICD-10-CM

## 2019-11-07 LAB — CBC WITH DIFFERENTIAL (CANCER CENTER ONLY)
Abs Immature Granulocytes: 0.09 10*3/uL — ABNORMAL HIGH (ref 0.00–0.07)
Basophils Absolute: 0.1 10*3/uL (ref 0.0–0.1)
Basophils Relative: 1 %
Eosinophils Absolute: 0.8 10*3/uL — ABNORMAL HIGH (ref 0.0–0.5)
Eosinophils Relative: 10 %
HCT: 41.2 % (ref 36.0–46.0)
Hemoglobin: 13.7 g/dL (ref 12.0–15.0)
Immature Granulocytes: 1 %
Lymphocytes Relative: 51 %
Lymphs Abs: 4 10*3/uL (ref 0.7–4.0)
MCH: 31.3 pg (ref 26.0–34.0)
MCHC: 33.3 g/dL (ref 30.0–36.0)
MCV: 94.1 fL (ref 80.0–100.0)
Monocytes Absolute: 0.5 10*3/uL (ref 0.1–1.0)
Monocytes Relative: 6 %
Neutro Abs: 2.4 10*3/uL (ref 1.7–7.7)
Neutrophils Relative %: 31 %
Platelet Count: 143 10*3/uL — ABNORMAL LOW (ref 150–400)
RBC: 4.38 MIL/uL (ref 3.87–5.11)
RDW: 15.9 % — ABNORMAL HIGH (ref 11.5–15.5)
WBC Count: 7.9 10*3/uL (ref 4.0–10.5)
nRBC: 0 % (ref 0.0–0.2)

## 2019-11-07 LAB — CMP (CANCER CENTER ONLY)
ALT: 12 U/L (ref 0–44)
AST: 30 U/L (ref 15–41)
Albumin: 3.6 g/dL (ref 3.5–5.0)
Alkaline Phosphatase: 97 U/L (ref 38–126)
Anion gap: 13 (ref 5–15)
BUN: 16 mg/dL (ref 8–23)
CO2: 21 mmol/L — ABNORMAL LOW (ref 22–32)
Calcium: 8.7 mg/dL — ABNORMAL LOW (ref 8.9–10.3)
Chloride: 107 mmol/L (ref 98–111)
Creatinine: 0.93 mg/dL (ref 0.44–1.00)
GFR, Est AFR Am: >60 mL/min (ref >60)
GFR, Estimated: 57 mL/min — ABNORMAL LOW (ref >60)
Glucose, Bld: 101 mg/dL — ABNORMAL HIGH (ref 70–99)
Potassium: 4.5 mmol/L (ref 3.5–5.1)
Sodium: 141 mmol/L (ref 135–145)
Total Bilirubin: 1 mg/dL (ref 0.3–1.2)
Total Protein: 6.4 g/dL — ABNORMAL LOW (ref 6.5–8.1)

## 2019-11-07 LAB — LACTATE DEHYDROGENASE: LDH: 393 U/L — ABNORMAL HIGH (ref 98–192)

## 2019-11-07 MED ORDER — SODIUM CHLORIDE 0.9% FLUSH
10.0000 mL | Freq: Once | INTRAVENOUS | Status: AC
  Administered 2019-11-07: 10 mL
  Filled 2019-11-07: qty 10

## 2019-11-07 MED ORDER — SODIUM CHLORIDE 0.9 % IV SOLN
375.0000 mg/m2 | Freq: Once | INTRAVENOUS | Status: AC
  Administered 2019-11-07: 700 mg via INTRAVENOUS
  Filled 2019-11-07: qty 50

## 2019-11-07 MED ORDER — DIPHENHYDRAMINE HCL 25 MG PO CAPS
50.0000 mg | ORAL_CAPSULE | Freq: Once | ORAL | Status: AC
  Administered 2019-11-07: 50 mg via ORAL

## 2019-11-07 MED ORDER — FAMOTIDINE IN NACL 20-0.9 MG/50ML-% IV SOLN
INTRAVENOUS | Status: AC
  Filled 2019-11-07: qty 50

## 2019-11-07 MED ORDER — FAMOTIDINE IN NACL 20-0.9 MG/50ML-% IV SOLN
20.0000 mg | Freq: Once | INTRAVENOUS | Status: AC
  Administered 2019-11-07: 20 mg via INTRAVENOUS

## 2019-11-07 MED ORDER — METHYLPREDNISOLONE SODIUM SUCC 125 MG IJ SOLR
125.0000 mg | Freq: Once | INTRAMUSCULAR | Status: AC
  Administered 2019-11-07: 125 mg via INTRAVENOUS

## 2019-11-07 MED ORDER — DIPHENHYDRAMINE HCL 25 MG PO CAPS
ORAL_CAPSULE | ORAL | Status: AC
  Filled 2019-11-07: qty 2

## 2019-11-07 MED ORDER — SODIUM CHLORIDE 0.9% FLUSH
10.0000 mL | INTRAVENOUS | Status: DC | PRN
  Administered 2019-11-07: 10 mL
  Filled 2019-11-07: qty 10

## 2019-11-07 MED ORDER — SODIUM CHLORIDE 0.9 % IV SOLN
Freq: Once | INTRAVENOUS | Status: AC
  Administered 2019-11-07: 11:00:00 via INTRAVENOUS
  Filled 2019-11-07: qty 250

## 2019-11-07 MED ORDER — METHYLPREDNISOLONE SODIUM SUCC 125 MG IJ SOLR
INTRAMUSCULAR | Status: AC
  Filled 2019-11-07: qty 2

## 2019-11-07 MED ORDER — ACETAMINOPHEN 325 MG PO TABS
ORAL_TABLET | ORAL | Status: AC
  Filled 2019-11-07: qty 2

## 2019-11-07 MED ORDER — ACETAMINOPHEN 325 MG PO TABS
650.0000 mg | ORAL_TABLET | Freq: Once | ORAL | Status: AC
  Administered 2019-11-07: 650 mg via ORAL

## 2019-11-07 MED ORDER — HEPARIN SOD (PORK) LOCK FLUSH 100 UNIT/ML IV SOLN
500.0000 [IU] | Freq: Once | INTRAVENOUS | Status: AC | PRN
  Administered 2019-11-07: 500 [IU]
  Filled 2019-11-07: qty 5

## 2019-11-07 NOTE — Progress Notes (Signed)
Tracy Mclaughlin OFFICE PROGRESS NOTE   Diagnosis: Non-Hodgkin's lymphoma  INTERVAL HISTORY:   [name] returns as scheduled.  She began another cycle of Revlimid/rituximab on 10/10/2019.  She reports tolerating treatment well.  No sign of allergic reaction.  No symptom of thrombosis.  No fever or night sweats.  Good appetite.  She continues to have malaise.  Objective:  Vital signs in last 24 hours:  Blood pressure (!) 147/55, pulse 68, temperature 98.9 F (37.2 C), temperature source Temporal, resp. rate 18, height 5\' 7"  (1.702 m), weight 182 lb 11.2 oz (82.9 kg), SpO2 98 %.   Limited physical examination secondary to distancing with the Covid pandemic Lymphatics: Bilateral 1-1.5 cm cervical and scalene nodes, 1 cm right axillary node, 3 cm left axillary node.  No inguinal nodes. GI: No hepatosplenomegaly, nontender Vascular: No leg edema   Portacath/PICC-without erythema  Lab Results:  Lab Results  Component Value Date   WBC 7.9 11/07/2019   HGB 13.7 11/07/2019   HCT 41.2 11/07/2019   MCV 94.1 11/07/2019   PLT 143 (L) 11/07/2019   NEUTROABS 2.4 11/07/2019    CMP  Lab Results  Component Value Date   NA 143 10/10/2019   K 4.6 10/10/2019   CL 106 10/10/2019   CO2 25 10/10/2019   GLUCOSE 108 (H) 10/10/2019   BUN 12 10/10/2019   CREATININE 0.88 10/10/2019   CALCIUM 8.9 10/10/2019   PROT 6.2 (L) 10/10/2019   ALBUMIN 3.4 (L) 10/10/2019   AST 33 10/10/2019   ALT 11 10/10/2019   ALKPHOS 111 10/10/2019   BILITOT 0.9 10/10/2019   GFRNONAA >60 10/10/2019   GFRAA >60 10/10/2019     Medications: I have reviewed the patient's current medications.   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 Status post weekly Rituxan x4 03/01/2012 through 03/22/2012. She completed 4 "maintenance" doses of Rituxan, last on 12/19/2012. A restaging CT  on 02/09/2013 showed no evidence of lymphoma.   Lymph node lateral to the thyroid bed on a neck ultrasound 02/21/2014, status post an FNA biopsy concerning for a lymphoproliferative disorder.  PET scan 09/28/2016 with active lymphoma within the neck, chest, abdomen, pelvis; splenic enlargement and hypermetabolism suspicious for splenic involvement.  Initiation of Rituxan weekly 4 09/29/2016  Initiation of maintenance Rituxan on a 3 month schedule 12/23/2016; final Rituxan 08/31/2018  Thyroid ultrasound 02/07/2019-left cervical lymphadenopathy  PET scan 03/08/2019-extensive recurrent hypermetabolic lymphoma involving the neck, chest, abdomen and pelvis.  03/16/2019 left cervical lymph node biopsy-features consistent with previously diagnosed non-Hodgkin's B-cell lymphoma, phenotypically consistent with marginal zone lymphoma. Flow cytometry with lambda restricted B-cell population without expression of CD5 or CD10 comprising 87% of all lymphocytes.  Cycle 1 bendamustine/Rituxan 03/22/2019  Excision deep left axillary lymph nodes 05/04/2019-non-Hodgkin's B-cell lymphoma with differential including a marginal zone lymphoma and atypical small lymphocytic lymphoma. Flow cytometry with monoclonal B-cell population without expression of CD5 or CD10, comprises 96% of all lymphocytes.  Cycle 1 CHOP/Rituxan 05/18/2019  Cycle 2 CHOP/Rituxan 06/08/2019  CTs 06/15/2019-partial improvement in diffuse adenopathy, stable mild splenomegaly  Cycle 1 Revlimid/rituximab 07/19/2019 (Revlimid start 07/20/2019)  Cycle 2 Revlimid/rituximab 08/16/2019 (Revlimid placed on hold 08/30/2019 due to neutropenia)  Cycle 3 of Revlimid/rituximab 09/13/2019 (Revlimid schedule changed to 14 days on/14 days off)  Cycle 4 Revlimid/rituximab 10/10/2019  Cycle 5 Revlimid/rituximab 11/07/2019 2. Stage IV (T1bN1b M0) papillary thyroid cancer, status post a thyroidectomy with reimplantation of the left superior  parathyroid gland  on 05/23/2012, status post radioactive iodine therapy, followed by Dr. Buddy Duty.  3. Stage II (T3 N0) colon cancer, status post a right colectomy 10/19/2011, last colonoscopy April 2015-sigmoid adenoma removed.  4. History of a pulmonary embolism December 2012.  5. History of Atrial fibrillation 6. Iron deficiency anemia-new 03/18/2014. Hemoccult positive stool. The anemia corrected with iron. No longer taking iron.  Status post an upper endoscopy and colonoscopy by Dr. Carlean Purl April 2015 with no bleeding source identified, benign adenoma removed from the sigmoid colon. 7. Report of an upper gastric intestinal bleed fall 2016-managed in [place]. [name] 8. Left knee replacement May 2017, repeat left knee surgery May 2018 9. Pruritic rash 07/22/2016 10. Nausea and diarrhea 04/02/2019-stool negative for C. difficile toxin 11. 06/18/2019 [facility] admission for symptomatic anemia.  Disposition: [name] appears stable.  She is tolerating the Revlimid/rituximab well.  No evidence for progression of the lymphoma.  She will begin another cycle of Revlimid/rituximab today.  Ms. Campton will return for office visit and rituximab in 1 month.  Betsy Coder, MD  11/07/2019  9:44 AM

## 2019-11-07 NOTE — Progress Notes (Signed)
Assisted patient in [place] and delivery scheduled for 11/08/19

## 2019-11-07 NOTE — Progress Notes (Signed)
Patient is due to start her Revlimid on 11/19, however she does not have her medication. Reports she has her phone blocked from getting 1-800 calls. Will assist her in calling pharmacy while here in the office.

## 2019-11-08 ENCOUNTER — Telehealth: Payer: Self-pay | Admitting: Oncology

## 2019-11-08 NOTE — Telephone Encounter (Signed)
Scheduled per los. Called and left msg. Mailed printout  °

## 2019-11-30 ENCOUNTER — Other Ambulatory Visit: Payer: Self-pay | Admitting: *Deleted

## 2019-11-30 DIAGNOSIS — C8510 Unspecified B-cell lymphoma, unspecified site: Secondary | ICD-10-CM

## 2019-11-30 MED ORDER — LENALIDOMIDE 10 MG PO CAPS: 10.0000 mg | ORAL_CAPSULE | Freq: Every day | ORAL | 0 refills | Status: DC

## 2019-12-02 ENCOUNTER — Other Ambulatory Visit: Payer: Self-pay | Admitting: Oncology

## 2019-12-04 ENCOUNTER — Telehealth: Payer: Self-pay | Admitting: *Deleted

## 2019-12-04 NOTE — Telephone Encounter (Signed)
Concerned weather may be too bad to travel tomorrow. Asking how it will be rescheduled? Instructed her to call tomorrow if roads too bad to travel and we will work on the reschedule at that time. Reminded her to call her specialty pharmacy to arrange for delivery of her Revlimid. It was sent in on Friday.

## 2019-12-05 ENCOUNTER — Telehealth: Payer: Self-pay | Admitting: Nurse Practitioner

## 2019-12-05 ENCOUNTER — Telehealth: Payer: Self-pay | Admitting: *Deleted

## 2019-12-05 ENCOUNTER — Inpatient Hospital Stay: Payer: Medicare PPO

## 2019-12-05 ENCOUNTER — Inpatient Hospital Stay: Payer: Medicare PPO | Admitting: Oncology

## 2019-12-05 NOTE — Telephone Encounter (Signed)
Scheduled appt per 12/16 sch message - pt husband aware of new appt date and time

## 2019-12-05 NOTE — Telephone Encounter (Signed)
Left VM that weather and roads too bad to come today. Needs to reschedule. Rescheduled her f/u to 12/18 at 1145. High priority scheduling message sent to see if she can be added to 12/18 infusion schedule for her Rituxan. Notified husband that scheduler will be calling them for new appointments.

## 2019-12-07 ENCOUNTER — Other Ambulatory Visit: Payer: Self-pay

## 2019-12-07 ENCOUNTER — Encounter: Payer: Self-pay | Admitting: Nurse Practitioner

## 2019-12-07 ENCOUNTER — Inpatient Hospital Stay: Payer: Medicare PPO

## 2019-12-07 ENCOUNTER — Inpatient Hospital Stay: Payer: Medicare PPO | Attending: Oncology | Admitting: Nurse Practitioner

## 2019-12-07 VITALS — BP 134/46 | HR 70 | Temp 97.8°F | Resp 17 | Ht 67.0 in | Wt 178.2 lb

## 2019-12-07 VITALS — BP 113/43 | HR 64 | Temp 98.3°F | Resp 18

## 2019-12-07 DIAGNOSIS — C8261 Cutaneous follicle center lymphoma, lymph nodes of head, face, and neck: Secondary | ICD-10-CM | POA: Insufficient documentation

## 2019-12-07 DIAGNOSIS — C8510 Unspecified B-cell lymphoma, unspecified site: Secondary | ICD-10-CM | POA: Diagnosis not present

## 2019-12-07 DIAGNOSIS — Z5112 Encounter for antineoplastic immunotherapy: Secondary | ICD-10-CM | POA: Insufficient documentation

## 2019-12-07 DIAGNOSIS — Z95828 Presence of other vascular implants and grafts: Secondary | ICD-10-CM

## 2019-12-07 DIAGNOSIS — C8267 Cutaneous follicle center lymphoma, spleen: Secondary | ICD-10-CM | POA: Diagnosis present

## 2019-12-07 DIAGNOSIS — C73 Malignant neoplasm of thyroid gland: Secondary | ICD-10-CM

## 2019-12-07 LAB — CBC WITH DIFFERENTIAL (CANCER CENTER ONLY)
Abs Immature Granulocytes: 0.09 10*3/uL — ABNORMAL HIGH (ref 0.00–0.07)
Basophils Absolute: 0.2 10*3/uL — ABNORMAL HIGH (ref 0.0–0.1)
Basophils Relative: 2 %
Eosinophils Absolute: 0.4 10*3/uL (ref 0.0–0.5)
Eosinophils Relative: 5 %
HCT: 40.6 % (ref 36.0–46.0)
Hemoglobin: 13.7 g/dL (ref 12.0–15.0)
Immature Granulocytes: 1 %
Lymphocytes Relative: 55 %
Lymphs Abs: 4.4 10*3/uL — ABNORMAL HIGH (ref 0.7–4.0)
MCH: 32 pg (ref 26.0–34.0)
MCHC: 33.7 g/dL (ref 30.0–36.0)
MCV: 94.9 fL (ref 80.0–100.0)
Monocytes Absolute: 0.6 10*3/uL (ref 0.1–1.0)
Monocytes Relative: 8 %
Neutro Abs: 2.3 10*3/uL (ref 1.7–7.7)
Neutrophils Relative %: 29 %
Platelet Count: 138 10*3/uL — ABNORMAL LOW (ref 150–400)
RBC: 4.28 MIL/uL (ref 3.87–5.11)
RDW: 16 % — ABNORMAL HIGH (ref 11.5–15.5)
WBC Count: 7.9 10*3/uL (ref 4.0–10.5)
nRBC: 0 % (ref 0.0–0.2)

## 2019-12-07 LAB — CMP (CANCER CENTER ONLY)
ALT: 13 U/L (ref 0–44)
AST: 32 U/L (ref 15–41)
Albumin: 3.8 g/dL (ref 3.5–5.0)
Alkaline Phosphatase: 105 U/L (ref 38–126)
Anion gap: 11 (ref 5–15)
BUN: 20 mg/dL (ref 8–23)
CO2: 23 mmol/L (ref 22–32)
Calcium: 8.9 mg/dL (ref 8.9–10.3)
Chloride: 106 mmol/L (ref 98–111)
Creatinine: 0.97 mg/dL (ref 0.44–1.00)
GFR, Est AFR Am: >60 mL/min (ref >60)
GFR, Estimated: 54 mL/min — ABNORMAL LOW (ref >60)
Glucose, Bld: 97 mg/dL (ref 70–99)
Potassium: 4.5 mmol/L (ref 3.5–5.1)
Sodium: 140 mmol/L (ref 135–145)
Total Bilirubin: 1.1 mg/dL (ref 0.3–1.2)
Total Protein: 6.6 g/dL (ref 6.5–8.1)

## 2019-12-07 LAB — LACTATE DEHYDROGENASE: LDH: 368 U/L — ABNORMAL HIGH (ref 98–192)

## 2019-12-07 MED ORDER — SODIUM CHLORIDE 0.9 % IV SOLN
Freq: Once | INTRAVENOUS | Status: AC
  Filled 2019-12-07: qty 250

## 2019-12-07 MED ORDER — SODIUM CHLORIDE 0.9% FLUSH
10.0000 mL | Freq: Once | INTRAVENOUS | Status: AC
  Administered 2019-12-07: 10 mL
  Filled 2019-12-07: qty 10

## 2019-12-07 MED ORDER — HEPARIN SOD (PORK) LOCK FLUSH 100 UNIT/ML IV SOLN
500.0000 [IU] | Freq: Once | INTRAVENOUS | Status: AC | PRN
  Administered 2019-12-07: 500 [IU]
  Filled 2019-12-07: qty 5

## 2019-12-07 MED ORDER — FAMOTIDINE IN NACL 20-0.9 MG/50ML-% IV SOLN
20.0000 mg | Freq: Once | INTRAVENOUS | Status: AC
  Administered 2019-12-07: 20 mg via INTRAVENOUS

## 2019-12-07 MED ORDER — ACETAMINOPHEN 325 MG PO TABS
650.0000 mg | ORAL_TABLET | Freq: Once | ORAL | Status: AC
  Administered 2019-12-07: 650 mg via ORAL

## 2019-12-07 MED ORDER — SODIUM CHLORIDE 0.9% FLUSH
10.0000 mL | INTRAVENOUS | Status: DC | PRN
  Administered 2019-12-07: 10 mL
  Filled 2019-12-07: qty 10

## 2019-12-07 MED ORDER — FAMOTIDINE IN NACL 20-0.9 MG/50ML-% IV SOLN
INTRAVENOUS | Status: AC
  Filled 2019-12-07: qty 50

## 2019-12-07 MED ORDER — DIPHENHYDRAMINE HCL 25 MG PO CAPS
ORAL_CAPSULE | ORAL | Status: AC
  Filled 2019-12-07: qty 2

## 2019-12-07 MED ORDER — METHYLPREDNISOLONE SODIUM SUCC 125 MG IJ SOLR
125.0000 mg | Freq: Once | INTRAMUSCULAR | Status: AC
  Administered 2019-12-07: 125 mg via INTRAVENOUS

## 2019-12-07 MED ORDER — DIPHENHYDRAMINE HCL 25 MG PO CAPS
50.0000 mg | ORAL_CAPSULE | Freq: Once | ORAL | Status: AC
  Administered 2019-12-07: 50 mg via ORAL

## 2019-12-07 MED ORDER — ACETAMINOPHEN 325 MG PO TABS
ORAL_TABLET | ORAL | Status: AC
  Filled 2019-12-07: qty 2

## 2019-12-07 MED ORDER — SODIUM CHLORIDE 0.9 % IV SOLN
375.0000 mg/m2 | Freq: Once | INTRAVENOUS | Status: AC
  Administered 2019-12-07: 700 mg via INTRAVENOUS
  Filled 2019-12-07: qty 20

## 2019-12-07 MED ORDER — METHYLPREDNISOLONE SODIUM SUCC 125 MG IJ SOLR
INTRAMUSCULAR | Status: AC
  Filled 2019-12-07: qty 2

## 2019-12-07 NOTE — Progress Notes (Signed)
[place] OFFICE PROGRESS NOTE   Diagnosis: Non-Hodgkin's lymphoma  INTERVAL HISTORY:   Tracy Mclaughlin returns for follow-up.  She completed cycle 5 Revlimid/Rituxan beginning 11/07/2019. No nausea or vomiting. No mouth sores. No diarrhea. She notes a small rash at the right ankle similar to what she has had in the past. No fevers or sweats. Stable dyspnea and cough.  Objective:  Vital signs in last 24 hours:  Blood pressure (!) 134/46, pulse 70, temperature 97.8 F (36.6 C), temperature source Temporal, resp. rate 17, height 5\' 7"  (1.702 m), weight 178 lb 3.2 oz (80.8 kg), SpO2 97 %.    Lymphatics: Small 1 to 1/2 cm left cervical and scalene nodes. 1 cm right axillary node 3 cm left axillary node. Resp: Lungs clear bilaterally. Cardio: Regular rate and rhythm. GI: Abdomen soft and nontender. No hepatosplenomegaly. Vascular: No leg edema. Port-A-Cath without erythema.  Lab Results:  Lab Results  Component Value Date   WBC 7.9 12/07/2019   HGB 13.7 12/07/2019   HCT 40.6 12/07/2019   MCV 94.9 12/07/2019   PLT 138 (L) 12/07/2019   NEUTROABS 2.3 12/07/2019    Imaging:  No results found.  Medications: I have reviewed the patient's current medications.  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 Status post weekly Rituxan x4 03/01/2012 through 03/22/2012. She completed 4 "maintenance" doses of Rituxan, last on 12/19/2012. A restaging CT on 02/09/2013 showed no evidence of lymphoma.   Lymph node lateral to the thyroid bed on a neck ultrasound 02/21/2014, status post an FNA biopsy concerning for a lymphoproliferative disorder.  PET scan 09/28/2016 with active lymphoma within the neck, chest, abdomen, pelvis; splenic enlargement and hypermetabolism suspicious for splenic involvement.  Initiation of Rituxan weekly 4  09/29/2016  Initiation of maintenance Rituxan on a 3 month schedule 12/23/2016; final Rituxan 08/31/2018  Thyroid ultrasound 02/07/2019-left cervical lymphadenopathy  PET scan 03/08/2019-extensive recurrent hypermetabolic lymphoma involving the neck, chest, abdomen and pelvis.  03/16/2019 left cervical lymph node biopsy-features consistent with previously diagnosed non-Hodgkin's B-cell lymphoma, phenotypically consistent with marginal zone lymphoma. Flow cytometry with lambda restricted B-cell population without expression of CD5 or CD10 comprising 87% of all lymphocytes.  Cycle 1 bendamustine/Rituxan 03/22/2019  Excision deep left axillary lymph nodes 05/04/2019-non-Hodgkin's B-cell lymphoma with differential including a marginal zone lymphoma and atypical small lymphocytic lymphoma. Flow cytometry with monoclonal B-cell population without expression of CD5 or CD10, comprises 96% of all lymphocytes.  Cycle 1 CHOP/Rituxan 05/18/2019  Cycle 2 CHOP/Rituxan 06/08/2019  CTs 06/15/2019-partial improvement in diffuse adenopathy, stable mild splenomegaly  Cycle 1 Revlimid/rituximab 07/19/2019 (Revlimid start 07/20/2019)  Cycle 2 Revlimid/rituximab 08/16/2019 (Revlimid placed on hold 08/30/2019 due to neutropenia)  Cycle 3 of Revlimid/rituximab 09/13/2019 (Revlimid schedule changed to 14 days on/14 days off)  Cycle 4 Revlimid/rituximab 10/10/2019  Cycle 5 Revlimid/rituximab 11/07/2019  Cycle 6 Revlimid/rituximab 12/07/2019 2. Stage IV (T1bN1b M0) papillary thyroid cancer, status post a thyroidectomy with reimplantation of the left superior parathyroid gland on 05/23/2012, status post radioactive iodine therapy, followed by Dr. Buddy Mclaughlin.  3. Stage II (T3 N0) colon cancer, status post a right colectomy 10/19/2011, last colonoscopy April 2015-sigmoid adenoma removed.  4. History of a pulmonary embolism December 2012.  5. History of Atrial fibrillation 6. Iron deficiency anemia-new 03/18/2014.  Hemoccult positive stool. The anemia corrected with iron. No longer taking iron.  Status post an upper endoscopy and colonoscopy by Dr. Carlean Mclaughlin April 2015 with no bleeding  source identified, benign adenoma removed from the sigmoid colon. 7. Report of an upper gastric intestinal bleed fall 2016-managed in [place]. [name] 8. Left knee replacement May 2017, repeat left knee surgery May 2018 9. Pruritic rash 07/22/2016 10. Nausea and diarrhea 04/02/2019-stool negative for C. difficile toxin 11. 06/18/2019 [facility] admission for symptomatic anemia.   Disposition: [name] appears stable. She has completed 5 cycles of Revlimid/rituximab. There is no clinical evidence of disease progression. Plan to proceed with cycle 6 today as scheduled. She will undergo restaging CTs prior to her next visit.  We reviewed the CBC and chemistry panel from today, adequate for treatment.  She will return for follow-up in 4 weeks. She will contact the office in the interim with any problems.    Tracy Mclaughlin ANP/GNP-BC   12/07/2019  12:05 PM

## 2019-12-26 ENCOUNTER — Other Ambulatory Visit: Payer: Self-pay | Admitting: *Deleted

## 2019-12-26 DIAGNOSIS — C8510 Unspecified B-cell lymphoma, unspecified site: Secondary | ICD-10-CM

## 2019-12-26 MED ORDER — LENALIDOMIDE 10 MG PO CAPS: 10.0000 mg | ORAL_CAPSULE | Freq: Every day | ORAL | 0 refills | Status: DC

## 2019-12-28 ENCOUNTER — Ambulatory Visit (HOSPITAL_COMMUNITY)
Admission: RE | Admit: 2019-12-28 | Discharge: 2019-12-28 | Disposition: A | Discharge status: Home / Self Care | Payer: Medicare PPO | Source: Ambulatory Visit | Attending: Nurse Practitioner | Admitting: Nurse Practitioner

## 2019-12-28 DIAGNOSIS — C8510 Unspecified B-cell lymphoma, unspecified site: Secondary | ICD-10-CM

## 2019-12-28 MED ORDER — HEPARIN SOD (PORK) LOCK FLUSH 100 UNIT/ML IV SOLN
INTRAVENOUS | Status: AC
  Filled 2019-12-28: qty 5

## 2019-12-28 MED ORDER — SODIUM CHLORIDE (PF) 0.9 % IJ SOLN
INTRAMUSCULAR | Status: AC
  Filled 2019-12-28: qty 50

## 2019-12-28 MED ORDER — IOHEXOL 300 MG/ML  SOLN
100.0000 mL | Freq: Once | INTRAMUSCULAR | Status: AC | PRN
  Administered 2019-12-28: 100 mL via INTRAVENOUS

## 2020-01-04 ENCOUNTER — Inpatient Hospital Stay: Payer: Medicare PPO

## 2020-01-04 ENCOUNTER — Other Ambulatory Visit: Payer: Self-pay

## 2020-01-04 ENCOUNTER — Inpatient Hospital Stay: Payer: Medicare PPO | Attending: Oncology | Admitting: Oncology

## 2020-01-04 VITALS — BP 113/43 | HR 56 | Temp 98.8°F | Resp 17

## 2020-01-04 VITALS — BP 145/48 | HR 61 | Temp 98.3°F | Resp 17 | Ht 67.0 in | Wt 179.9 lb

## 2020-01-04 DIAGNOSIS — C8261 Cutaneous follicle center lymphoma, lymph nodes of head, face, and neck: Secondary | ICD-10-CM | POA: Diagnosis present

## 2020-01-04 DIAGNOSIS — Z95828 Presence of other vascular implants and grafts: Secondary | ICD-10-CM

## 2020-01-04 DIAGNOSIS — C73 Malignant neoplasm of thyroid gland: Secondary | ICD-10-CM

## 2020-01-04 DIAGNOSIS — C8510 Unspecified B-cell lymphoma, unspecified site: Secondary | ICD-10-CM

## 2020-01-04 DIAGNOSIS — Z5112 Encounter for antineoplastic immunotherapy: Secondary | ICD-10-CM | POA: Diagnosis present

## 2020-01-04 DIAGNOSIS — C8267 Cutaneous follicle center lymphoma, spleen: Secondary | ICD-10-CM | POA: Diagnosis present

## 2020-01-04 LAB — CBC WITH DIFFERENTIAL (CANCER CENTER ONLY)
Abs Immature Granulocytes: 0.05 10*3/uL (ref 0.00–0.07)
Basophils Absolute: 0.1 10*3/uL (ref 0.0–0.1)
Basophils Relative: 2 %
Eosinophils Absolute: 0.6 10*3/uL — ABNORMAL HIGH (ref 0.0–0.5)
Eosinophils Relative: 9 %
HCT: 39.2 % (ref 36.0–46.0)
Hemoglobin: 13.5 g/dL (ref 12.0–15.0)
Immature Granulocytes: 1 %
Lymphocytes Relative: 47 %
Lymphs Abs: 3.3 10*3/uL (ref 0.7–4.0)
MCH: 33.7 pg (ref 26.0–34.0)
MCHC: 34.4 g/dL (ref 30.0–36.0)
MCV: 97.8 fL (ref 80.0–100.0)
Monocytes Absolute: 0.3 10*3/uL (ref 0.1–1.0)
Monocytes Relative: 5 %
Neutro Abs: 2.5 10*3/uL (ref 1.7–7.7)
Neutrophils Relative %: 36 %
Platelet Count: 145 10*3/uL — ABNORMAL LOW (ref 150–400)
RBC: 4.01 MIL/uL (ref 3.87–5.11)
RDW: 16.6 % — ABNORMAL HIGH (ref 11.5–15.5)
WBC Count: 7 10*3/uL (ref 4.0–10.5)
nRBC: 0 % (ref 0.0–0.2)

## 2020-01-04 LAB — CMP (CANCER CENTER ONLY)
ALT: 7 U/L (ref 0–44)
AST: 24 U/L (ref 15–41)
Albumin: 3.6 g/dL (ref 3.5–5.0)
Alkaline Phosphatase: 99 U/L (ref 38–126)
Anion gap: 10 (ref 5–15)
BUN: 15 mg/dL (ref 8–23)
CO2: 24 mmol/L (ref 22–32)
Calcium: 8.4 mg/dL — ABNORMAL LOW (ref 8.9–10.3)
Chloride: 109 mmol/L (ref 98–111)
Creatinine: 0.95 mg/dL (ref 0.44–1.00)
GFR, Est AFR Am: >60 mL/min (ref >60)
GFR, Estimated: 55 mL/min — ABNORMAL LOW (ref >60)
Glucose, Bld: 96 mg/dL (ref 70–99)
Potassium: 4.5 mmol/L (ref 3.5–5.1)
Sodium: 143 mmol/L (ref 135–145)
Total Bilirubin: 1 mg/dL (ref 0.3–1.2)
Total Protein: 6.4 g/dL — ABNORMAL LOW (ref 6.5–8.1)

## 2020-01-04 LAB — LACTATE DEHYDROGENASE: LDH: 340 U/L — ABNORMAL HIGH (ref 98–192)

## 2020-01-04 MED ORDER — ACETAMINOPHEN 325 MG PO TABS
ORAL_TABLET | ORAL | Status: AC
  Filled 2020-01-04: qty 2

## 2020-01-04 MED ORDER — DIPHENHYDRAMINE HCL 25 MG PO CAPS
ORAL_CAPSULE | ORAL | Status: AC
  Filled 2020-01-04: qty 2

## 2020-01-04 MED ORDER — METHYLPREDNISOLONE SODIUM SUCC 125 MG IJ SOLR
125.0000 mg | Freq: Once | INTRAMUSCULAR | Status: AC
  Administered 2020-01-04: 125 mg via INTRAVENOUS

## 2020-01-04 MED ORDER — SODIUM CHLORIDE 0.9 % IV SOLN
375.0000 mg/m2 | Freq: Once | INTRAVENOUS | Status: AC
  Administered 2020-01-04: 700 mg via INTRAVENOUS
  Filled 2020-01-04: qty 50

## 2020-01-04 MED ORDER — METHYLPREDNISOLONE SODIUM SUCC 125 MG IJ SOLR
INTRAMUSCULAR | Status: AC
  Filled 2020-01-04: qty 2

## 2020-01-04 MED ORDER — FAMOTIDINE IN NACL 20-0.9 MG/50ML-% IV SOLN
INTRAVENOUS | Status: AC
  Filled 2020-01-04: qty 50

## 2020-01-04 MED ORDER — SODIUM CHLORIDE 0.9 % IV SOLN
Freq: Once | INTRAVENOUS | Status: AC
  Filled 2020-01-04: qty 250

## 2020-01-04 MED ORDER — DIPHENHYDRAMINE HCL 25 MG PO CAPS
50.0000 mg | ORAL_CAPSULE | Freq: Once | ORAL | Status: AC
  Administered 2020-01-04: 50 mg via ORAL

## 2020-01-04 MED ORDER — HEPARIN SOD (PORK) LOCK FLUSH 100 UNIT/ML IV SOLN
500.0000 [IU] | Freq: Once | INTRAVENOUS | Status: AC | PRN
  Administered 2020-01-04: 500 [IU]
  Filled 2020-01-04: qty 5

## 2020-01-04 MED ORDER — FAMOTIDINE IN NACL 20-0.9 MG/50ML-% IV SOLN
20.0000 mg | Freq: Once | INTRAVENOUS | Status: AC
  Administered 2020-01-04: 20 mg via INTRAVENOUS

## 2020-01-04 MED ORDER — SODIUM CHLORIDE 0.9% FLUSH
10.0000 mL | INTRAVENOUS | Status: DC | PRN
  Administered 2020-01-04: 10 mL
  Filled 2020-01-04: qty 10

## 2020-01-04 MED ORDER — SODIUM CHLORIDE 0.9% FLUSH
10.0000 mL | Freq: Once | INTRAVENOUS | Status: AC
  Administered 2020-01-04: 10 mL
  Filled 2020-01-04: qty 10

## 2020-01-04 MED ORDER — ACETAMINOPHEN 325 MG PO TABS
650.0000 mg | ORAL_TABLET | Freq: Once | ORAL | Status: AC
  Administered 2020-01-04: 650 mg via ORAL

## 2020-01-04 NOTE — Patient Instructions (Signed)

## 2020-01-04 NOTE — Progress Notes (Signed)
[employer] [facility] OFFICE PROGRESS NOTE   Diagnosis: Non-Hodgkin's lymphoma  INTERVAL HISTORY:   Tracy Mclaughlin completed another cycle of Revlimid/rituximab beginning 12/07/2019.  She tolerated treatment well.  She notes a decrease in left neck lymphadenopathy after completing Revlimid.  The lymphadenopathy then returns to baseline.  Good appetite.  No new complaints.  Objective:  Vital signs in last 24 hours:  Blood pressure (!) 145/48, pulse 61, temperature 98.3 F (36.8 C), temperature source Temporal, resp. rate 17, height 5\' 7"  (1.702 m), weight 179 lb 14.4 oz (81.6 kg), SpO2 98 %.  Lymphatics: Less than 2 cm bilateral cervical nodes.  3 cm left axillary node, 1-2 cm right axillary node.  Possible node in the upper lateral left inguinal region Resp: Lungs clear bilaterally Cardio: Regular rate and rhythm GI: No hepatosplenomegaly, fullness in the right upper abdomen without a discrete mass Vascular: No leg edema    Portacath/PICC-without erythema  Lab Results:  Lab Results  Component Value Date   WBC 7.0 01/04/2020   HGB 13.5 01/04/2020   HCT 39.2 01/04/2020   MCV 97.8 01/04/2020   PLT 145 (L) 01/04/2020   NEUTROABS 2.5 01/04/2020    CMP  Lab Results  Component Value Date   NA 143 01/04/2020   K 4.5 01/04/2020   CL 109 01/04/2020   CO2 24 01/04/2020   GLUCOSE 96 01/04/2020   BUN 15 01/04/2020   CREATININE 0.95 01/04/2020   CALCIUM 8.4 (L) 01/04/2020   PROT 6.4 (L) 01/04/2020   ALBUMIN 3.6 01/04/2020   AST 24 01/04/2020   ALT 7 01/04/2020   ALKPHOS 99 01/04/2020   BILITOT 1.0 01/04/2020   GFRNONAA 55 (L) 01/04/2020   GFRAA >60 01/04/2020    Lab Results  Component Value Date   CEA1 3.16 07/22/2016   CEA1 2.7 07/22/2016    Lab Results  Component Value Date   INR 1.0 05/01/2019    Imaging:  No results found.  Medications: I have reviewed the patient's current medications.   Assessment/Plan: 1.Splenic marginal zone lymphoma versus  low-grade B-cell lymphoma presenting with a peripheral lymphocytosis splenomegaly and bone marrow involvement. Status post weekly Rituxan x4 03/01/2012 through 03/22/2012. She completed 4 "maintenance" doses of Rituxan, last on 12/19/2012. A restaging CT on 02/09/2013 showed no evidence of lymphoma.   Lymph node lateral to the thyroid bed on a neck ultrasound 02/21/2014, status post an FNA biopsy concerning for a lymphoproliferative disorder.  PET scan 09/28/2016 with active lymphoma within the neck, chest, abdomen, pelvis; splenic enlargement and hypermetabolism suspicious for splenic involvement.  Initiation of Rituxan weekly 4 09/29/2016  Initiation of maintenance Rituxan on a 3 month schedule 12/23/2016; final Rituxan 08/31/2018  Thyroid ultrasound 02/07/2019-left cervical lymphadenopathy  PET scan 03/08/2019-extensive recurrent hypermetabolic lymphoma involving the neck, chest, abdomen and pelvis.  03/16/2019 left cervical lymph node biopsy-features consistent with previously diagnosed non-Hodgkin's B-cell lymphoma, phenotypically consistent with marginal zone lymphoma. Flow cytometry with lambda restricted B-cell population without expression of CD5 or CD10 comprising 87% of all lymphocytes.  Cycle 1 bendamustine/Rituxan 03/22/2019  Excision deep left axillary lymph nodes 05/04/2019-non-Hodgkin's B-cell lymphoma with differential including a marginal zone lymphoma and atypical small lymphocytic lymphoma. Flow cytometry with monoclonal B-cell population without expression of CD5 or CD10, comprises 96% of all lymphocytes.  Cycle 1 CHOP/Rituxan 05/18/2019  Cycle 2 CHOP/Rituxan 06/08/2019  CTs 06/15/2019-partial improvement in diffuse adenopathy, stable mild splenomegaly  Cycle 1 Revlimid/rituximab 07/19/2019 (Revlimid start 07/20/2019)  Cycle 2 Revlimid/rituximab 08/16/2019 (Revlimid placed on hold  08/30/2019 due to neutropenia)  Cycle 3 of Revlimid/rituximab 09/13/2019 (Revlimid schedule  changed to 14 days on/14 days off)  Cycle 4 Revlimid/rituximab 10/10/2019  Cycle 5 Revlimid/rituximab 11/07/2019  Cycle 6 Revlimid/rituximab 12/07/2019  CTs 12/28/2019-diffuse lymphadenopathy-slightly increased compared to 06/15/2019  Cycle 7 Revlimid/rituximab 01/04/2020 2. Stage IV (T1bN1b M0) papillary thyroid cancer, status post a thyroidectomy with reimplantation of the left superior parathyroid gland on 05/23/2012, status post radioactive iodine therapy, followed by Dr. Buddy Duty.  3. Stage II (T3 N0) colon cancer, status post a right colectomy 10/19/2011, last colonoscopy April 2015-sigmoid adenoma removed.  4. History of a pulmonary embolism December 2012.  5. History of Atrial fibrillation 6. Iron deficiency anemia-new 03/18/2014. Hemoccult positive stool. The anemia corrected with iron. No longer taking iron.  Status post an upper endoscopy and colonoscopy by Dr. Carlean Purl April 2015 with no bleeding source identified, benign adenoma removed from the sigmoid colon. 7. Report of an upper gastric intestinal bleed fall 2016-managed in [place]. [name] 8. Left knee replacement May 2017, repeat left knee surgery May 2018 9. Pruritic rash 07/22/2016 10. Nausea and diarrhea 04/02/2019-stool negative for C. difficile toxin 11. 06/18/2019 [facility] admission for symptomatic anemia.     Disposition: Tracy Mclaughlin appears stable.  She has been maintained on Revlimid and rituximab since July 2020.  Her overall performance status has improved while on this therapy.  The measurable lymph nodes are slightly larger, but not significantly changed and there was a greater than 1 month gap between the baseline CT and beginning therapy.  I reviewed the CT images with Tracy Mclaughlin.  We decided to continue Revlimid/rituximab until there is clear evidence of disease progression.  She will complete another cycle today.  Tracy Mclaughlin will return for an office visit and rituximab in 1 month.  Betsy Coder, MD  01/04/2020   9:54 AM

## 2020-01-04 NOTE — Addendum Note (Signed)
Addended by: Betsy Coder B on: 01/04/2020 10:46 AM   Modules accepted: Orders

## 2020-01-04 NOTE — Patient Instructions (Signed)
[facility] °Discharge Instructions for Patients Receiving Chemotherapy ° °Today you received the following chemotherapy agents: Rituximab ° °To help prevent nausea and vomiting after your treatment, we encourage you to take your nausea medication as directed.  °  °If you develop nausea and vomiting that is not controlled by your nausea medication, call the clinic.  ° °BELOW ARE SYMPTOMS THAT SHOULD BE REPORTED IMMEDIATELY: °· *FEVER GREATER THAN 100.5 F °· *CHILLS WITH OR WITHOUT FEVER °· NAUSEA AND VOMITING THAT IS NOT CONTROLLED WITH YOUR NAUSEA MEDICATION °· *UNUSUAL SHORTNESS OF BREATH °· *UNUSUAL BRUISING OR BLEEDING °· TENDERNESS IN MOUTH AND THROAT WITH OR WITHOUT PRESENCE OF ULCERS °· *URINARY PROBLEMS °· *BOWEL PROBLEMS °· UNUSUAL RASH °Items with * indicate a potential emergency and should be followed up as soon as possible. ° °Feel free to call the clinic should you have any questions or concerns. The clinic phone number is [phone]. ° °Please show the CHEMO ALERT CARD at check-in to the Emergency Department and triage nurse. ° ° °

## 2020-01-27 ENCOUNTER — Other Ambulatory Visit: Payer: Self-pay | Admitting: Oncology

## 2020-01-31 ENCOUNTER — Other Ambulatory Visit: Payer: Self-pay | Admitting: *Deleted

## 2020-01-31 DIAGNOSIS — C8510 Unspecified B-cell lymphoma, unspecified site: Secondary | ICD-10-CM

## 2020-01-31 MED ORDER — LENALIDOMIDE 10 MG PO CAPS: 10.0000 mg | ORAL_CAPSULE | Freq: Every day | ORAL | 0 refills | Status: DC

## 2020-01-31 NOTE — Progress Notes (Signed)
Notified patient that Revlimid has been ordered. Suggested she reach out to [place] later to day to arrange delivery/

## 2020-02-01 ENCOUNTER — Inpatient Hospital Stay: Payer: Medicare PPO | Attending: Oncology | Admitting: Nurse Practitioner

## 2020-02-01 ENCOUNTER — Inpatient Hospital Stay: Payer: Medicare PPO

## 2020-02-01 ENCOUNTER — Other Ambulatory Visit: Payer: Self-pay

## 2020-02-01 ENCOUNTER — Encounter: Payer: Self-pay | Admitting: Nurse Practitioner

## 2020-02-01 VITALS — BP 109/48 | HR 62 | Temp 98.4°F | Resp 16

## 2020-02-01 VITALS — BP 137/56 | HR 58 | Temp 97.8°F | Resp 17 | Ht 67.0 in | Wt 179.3 lb

## 2020-02-01 DIAGNOSIS — C8510 Unspecified B-cell lymphoma, unspecified site: Secondary | ICD-10-CM

## 2020-02-01 DIAGNOSIS — Z95828 Presence of other vascular implants and grafts: Secondary | ICD-10-CM

## 2020-02-01 DIAGNOSIS — C8261 Cutaneous follicle center lymphoma, lymph nodes of head, face, and neck: Secondary | ICD-10-CM | POA: Insufficient documentation

## 2020-02-01 DIAGNOSIS — Z5112 Encounter for antineoplastic immunotherapy: Secondary | ICD-10-CM | POA: Insufficient documentation

## 2020-02-01 DIAGNOSIS — C8267 Cutaneous follicle center lymphoma, spleen: Secondary | ICD-10-CM | POA: Diagnosis present

## 2020-02-01 DIAGNOSIS — C73 Malignant neoplasm of thyroid gland: Secondary | ICD-10-CM

## 2020-02-01 LAB — CMP (CANCER CENTER ONLY)
ALT: 9 U/L (ref 0–44)
AST: 24 U/L (ref 15–41)
Albumin: 3.5 g/dL (ref 3.5–5.0)
Alkaline Phosphatase: 92 U/L (ref 38–126)
Anion gap: 9 (ref 5–15)
BUN: 14 mg/dL (ref 8–23)
CO2: 26 mmol/L (ref 22–32)
Calcium: 8.6 mg/dL — ABNORMAL LOW (ref 8.9–10.3)
Chloride: 107 mmol/L (ref 98–111)
Creatinine: 0.92 mg/dL (ref 0.44–1.00)
GFR, Est AFR Am: >60 mL/min (ref >60)
GFR, Estimated: 58 mL/min — ABNORMAL LOW (ref >60)
Glucose, Bld: 100 mg/dL — ABNORMAL HIGH (ref 70–99)
Potassium: 4.4 mmol/L (ref 3.5–5.1)
Sodium: 142 mmol/L (ref 135–145)
Total Bilirubin: 1.1 mg/dL (ref 0.3–1.2)
Total Protein: 6.2 g/dL — ABNORMAL LOW (ref 6.5–8.1)

## 2020-02-01 LAB — CBC WITH DIFFERENTIAL (CANCER CENTER ONLY)
Abs Immature Granulocytes: 0.05 10*3/uL (ref 0.00–0.07)
Basophils Absolute: 0.1 10*3/uL (ref 0.0–0.1)
Basophils Relative: 1 %
Eosinophils Absolute: 0.3 10*3/uL (ref 0.0–0.5)
Eosinophils Relative: 5 %
HCT: 36.5 % (ref 36.0–46.0)
Hemoglobin: 12.5 g/dL (ref 12.0–15.0)
Immature Granulocytes: 1 %
Lymphocytes Relative: 59 %
Lymphs Abs: 2.9 10*3/uL (ref 0.7–4.0)
MCH: 33.9 pg (ref 26.0–34.0)
MCHC: 34.2 g/dL (ref 30.0–36.0)
MCV: 98.9 fL (ref 80.0–100.0)
Monocytes Absolute: 0.3 10*3/uL (ref 0.1–1.0)
Monocytes Relative: 7 %
Neutro Abs: 1.4 10*3/uL — ABNORMAL LOW (ref 1.7–7.7)
Neutrophils Relative %: 27 %
Platelet Count: 114 10*3/uL — ABNORMAL LOW (ref 150–400)
RBC: 3.69 MIL/uL — ABNORMAL LOW (ref 3.87–5.11)
RDW: 16.5 % — ABNORMAL HIGH (ref 11.5–15.5)
WBC Count: 5 10*3/uL (ref 4.0–10.5)
nRBC: 0 % (ref 0.0–0.2)

## 2020-02-01 LAB — LACTATE DEHYDROGENASE: LDH: 314 U/L — ABNORMAL HIGH (ref 98–192)

## 2020-02-01 MED ORDER — METHYLPREDNISOLONE SODIUM SUCC 125 MG IJ SOLR
125.0000 mg | Freq: Once | INTRAMUSCULAR | Status: AC
  Administered 2020-02-01: 12:00:00 125 mg via INTRAVENOUS

## 2020-02-01 MED ORDER — ACETAMINOPHEN 325 MG PO TABS
ORAL_TABLET | ORAL | Status: AC
  Filled 2020-02-01: qty 2

## 2020-02-01 MED ORDER — HEPARIN SOD (PORK) LOCK FLUSH 100 UNIT/ML IV SOLN
500.0000 [IU] | Freq: Once | INTRAVENOUS | Status: AC | PRN
  Administered 2020-02-01: 16:00:00 500 [IU]
  Filled 2020-02-01: qty 5

## 2020-02-01 MED ORDER — SODIUM CHLORIDE 0.9 % IV SOLN
375.0000 mg/m2 | Freq: Once | INTRAVENOUS | Status: AC
  Administered 2020-02-01: 13:00:00 700 mg via INTRAVENOUS
  Filled 2020-02-01: qty 20

## 2020-02-01 MED ORDER — SODIUM CHLORIDE 0.9% FLUSH
10.0000 mL | Freq: Once | INTRAVENOUS | Status: AC
  Administered 2020-02-01: 10 mL
  Filled 2020-02-01: qty 10

## 2020-02-01 MED ORDER — ACETAMINOPHEN 325 MG PO TABS
650.0000 mg | ORAL_TABLET | Freq: Once | ORAL | Status: AC
  Administered 2020-02-01: 650 mg via ORAL

## 2020-02-01 MED ORDER — FAMOTIDINE IN NACL 20-0.9 MG/50ML-% IV SOLN
INTRAVENOUS | Status: AC
  Filled 2020-02-01: qty 50

## 2020-02-01 MED ORDER — SODIUM CHLORIDE 0.9 % IV SOLN
Freq: Once | INTRAVENOUS | Status: AC
  Filled 2020-02-01: qty 250

## 2020-02-01 MED ORDER — DIPHENHYDRAMINE HCL 25 MG PO CAPS
ORAL_CAPSULE | ORAL | Status: AC
  Filled 2020-02-01: qty 2

## 2020-02-01 MED ORDER — SODIUM CHLORIDE 0.9% FLUSH
10.0000 mL | INTRAVENOUS | Status: DC | PRN
  Administered 2020-02-01: 16:00:00 10 mL
  Filled 2020-02-01: qty 10

## 2020-02-01 MED ORDER — METHYLPREDNISOLONE SODIUM SUCC 125 MG IJ SOLR
INTRAMUSCULAR | Status: AC
  Filled 2020-02-01: qty 2

## 2020-02-01 MED ORDER — DIPHENHYDRAMINE HCL 25 MG PO CAPS
50.0000 mg | ORAL_CAPSULE | Freq: Once | ORAL | Status: AC
  Administered 2020-02-01: 12:00:00 50 mg via ORAL

## 2020-02-01 MED ORDER — FAMOTIDINE IN NACL 20-0.9 MG/50ML-% IV SOLN
20.0000 mg | Freq: Once | INTRAVENOUS | Status: AC
  Administered 2020-02-01: 20 mg via INTRAVENOUS

## 2020-02-01 NOTE — Patient Instructions (Signed)
[facility] Discharge Instructions for Patients Receiving Chemotherapy  Today you received the following chemotherapy agents: Rituximab   To help prevent nausea and vomiting after your treatment, we encourage you to take your nausea medication  as prescribed.    If you develop nausea and vomiting that is not controlled by your nausea medication, call the clinic.   BELOW ARE SYMPTOMS THAT SHOULD BE REPORTED IMMEDIATELY:  *FEVER GREATER THAN 100.5 F  *CHILLS WITH OR WITHOUT FEVER  NAUSEA AND VOMITING THAT IS NOT CONTROLLED WITH YOUR NAUSEA MEDICATION  *UNUSUAL SHORTNESS OF BREATH  *UNUSUAL BRUISING OR BLEEDING  TENDERNESS IN MOUTH AND THROAT WITH OR WITHOUT PRESENCE OF ULCERS  *URINARY PROBLEMS  *BOWEL PROBLEMS  UNUSUAL RASH Items with * indicate a potential emergency and should be followed up as soon as possible.  Feel free to call the clinic should you have any questions or concerns. The clinic phone number is [phone].  Please show the CHEMO ALERT CARD at check-in to the Emergency Department and triage nurse.   

## 2020-02-01 NOTE — Progress Notes (Signed)
[facility] OFFICE PROGRESS NOTE   Diagnosis: Non-Hodgkin's lymphoma  INTERVAL HISTORY:   Tracy Mclaughlin returns as scheduled.  She completed cycle 7 Revlimid/Rituxan beginning 01/04/2020.  Overall she feels well.  She denies nausea/vomiting.  No mouth sores.  No diarrhea.  No rash.  No fevers or sweats.  She notes her appetite is better when she is taking Revlimid.  Objective:  Vital signs in last 24 hours:  Blood pressure (!) 137/56, pulse (!) 58, temperature 97.8 F (36.6 C), temperature source Temporal, resp. rate 17, height 5\' 7"  (1.702 m), weight 179 lb 4.8 oz (81.3 kg), SpO2 99 %.    Lymphatics: Small less than 2 cm bilateral cervical nodes.  3 cm left axillary node.  1 cm right axillary node. GI: Abdomen soft and nontender.  No hepatosplenomegaly. Vascular: No leg edema. Port-A-Cath without erythema.  Lab Results:  Lab Results  Component Value Date   WBC 5.0 02/01/2020   HGB 12.5 02/01/2020   HCT 36.5 02/01/2020   MCV 98.9 02/01/2020   PLT 114 (L) 02/01/2020   NEUTROABS 1.4 (L) 02/01/2020    Imaging:  No results found.  Medications: I have reviewed the patient's current medications.  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 Status post weekly Rituxan x4 03/01/2012 through 03/22/2012. She completed 4 "maintenance" doses of Rituxan, last on 12/19/2012. A restaging CT on 02/09/2013 showed no evidence of lymphoma.   Lymph node lateral to the thyroid bed on a neck ultrasound 02/21/2014, status post an FNA biopsy concerning for a lymphoproliferative disorder.  PET scan 09/28/2016 with active lymphoma within the neck, chest, abdomen, pelvis; splenic enlargement and hypermetabolism suspicious for splenic involvement.  Initiation of Rituxan weekly 4 09/29/2016  Initiation of maintenance Rituxan on a 3 month schedule 12/23/2016;  final Rituxan 08/31/2018  Thyroid ultrasound 02/07/2019-left cervical lymphadenopathy  PET scan 03/08/2019-extensive recurrent hypermetabolic lymphoma involving the neck, chest, abdomen and pelvis.  03/16/2019 left cervical lymph node biopsy-features consistent with previously diagnosed non-Hodgkin's B-cell lymphoma, phenotypically consistent with marginal zone lymphoma. Flow cytometry with lambda restricted B-cell population without expression of CD5 or CD10 comprising 87% of all lymphocytes.  Cycle 1 bendamustine/Rituxan 03/22/2019  Excision deep left axillary lymph nodes 05/04/2019-non-Hodgkin's B-cell lymphoma with differential including a marginal zone lymphoma and atypical small lymphocytic lymphoma. Flow cytometry with monoclonal B-cell population without expression of CD5 or CD10, comprises 96% of all lymphocytes.  Cycle 1 CHOP/Rituxan 05/18/2019  Cycle 2 CHOP/Rituxan 06/08/2019  CTs 06/15/2019-partial improvement in diffuse adenopathy, stable mild splenomegaly  Cycle 1 Revlimid/rituximab 07/19/2019 (Revlimid start 07/20/2019)  Cycle 2 Revlimid/rituximab 08/16/2019 (Revlimid placed on hold 08/30/2019 due to neutropenia)  Cycle 3 of Revlimid/rituximab 09/13/2019 (Revlimid schedule changed to 14 days on/14 days off)  Cycle 4 Revlimid/rituximab 10/10/2019  Cycle 5 Revlimid/rituximab 11/07/2019  Cycle 6 Revlimid/rituximab 12/07/2019  CTs 12/28/2019-diffuse lymphadenopathy-slightly increased compared to 06/15/2019  Cycle 7 Revlimid/rituximab 01/04/2020  Cycle 8 Revlimid/Rituxan 02/01/2020 2. Stage IV (T1bN1b M0) papillary thyroid cancer, status post a thyroidectomy with reimplantation of the left superior parathyroid gland on 05/23/2012, status post radioactive iodine therapy, followed by Dr. Buddy Duty.  3. Stage II (T3 N0) colon cancer, status post a right colectomy 10/19/2011, last colonoscopy April 2015-sigmoid adenoma removed.  4. History of a pulmonary embolism December 2012.  5.  History of Atrial fibrillation 6. Iron deficiency anemia-new 03/18/2014. Hemoccult positive stool. The anemia corrected with iron. No longer taking iron.  Status post  an upper endoscopy and colonoscopy by Dr. Carlean Purl April 2015 with no bleeding source identified, benign adenoma removed from the sigmoid colon. 7. Report of an upper gastric intestinal bleed fall 2016-managed in [place]. [name] 8. Left knee replacement May 2017, repeat left knee surgery May 2018 9. Pruritic rash 07/22/2016 10. Nausea and diarrhea 04/02/2019-stool negative for C. difficile toxin 11. 06/18/2019 [facility] admission for symptomatic anemia.    Disposition: Tracy Mclaughlin appears stable.  She has completed 7 cycles of Revlimid/Rituxan.  There is no clinical evidence of disease progression.  Performance status continues to be improved.  Plan to proceed with cycle 8 today as scheduled.  We reviewed the CBC from today.  Counts adequate to proceed with treatment.  She has mild neutropenia and thrombocytopenia.  She understands to contact the office with fever, chills, other signs of infection, bleeding.  She will return for lab, follow-up, Rituxan in 4 weeks.  She will contact the office in the interim as outlined above or with any other problems.  Plan reviewed with Dr. Benay Spice.    Ned Card ANP/GNP-BC   02/01/2020  10:54 AM

## 2020-02-01 NOTE — Patient Instructions (Signed)

## 2020-02-04 ENCOUNTER — Telehealth: Payer: Self-pay | Admitting: *Deleted

## 2020-02-04 NOTE — Telephone Encounter (Signed)
Called to report she will receive her Revlimid in time to begin cycle on 02/06/20 and is requesting to move her next OV/treatment to 28 days after this. She wants to start her Revlimid on day of Rituxan infusion to keep things less confusing for her. Scheduling message sent with her request.

## 2020-02-05 ENCOUNTER — Telehealth: Payer: Self-pay | Admitting: Oncology

## 2020-02-05 NOTE — Telephone Encounter (Signed)
Scheduled per los. Called and spoke with patient. Confirmed appts  

## 2020-02-20 ENCOUNTER — Telehealth: Payer: Self-pay | Admitting: Oncology

## 2020-02-20 NOTE — Telephone Encounter (Signed)
Called and spoke with patient. Advised of change in appts from 3/12 to 3/17 per her request. Confirmed appts

## 2020-02-26 ENCOUNTER — Other Ambulatory Visit: Payer: Self-pay | Admitting: *Deleted

## 2020-02-26 DIAGNOSIS — C8510 Unspecified B-cell lymphoma, unspecified site: Secondary | ICD-10-CM

## 2020-02-26 MED ORDER — LENALIDOMIDE 10 MG PO CAPS: 10.0000 mg | ORAL_CAPSULE | Freq: Every day | ORAL | 0 refills | Status: DC

## 2020-02-29 ENCOUNTER — Ambulatory Visit: Payer: Medicare PPO

## 2020-02-29 ENCOUNTER — Other Ambulatory Visit: Payer: Medicare PPO

## 2020-02-29 ENCOUNTER — Ambulatory Visit: Payer: Medicare PPO | Admitting: Oncology

## 2020-03-02 ENCOUNTER — Other Ambulatory Visit: Payer: Self-pay | Admitting: Oncology

## 2020-03-05 ENCOUNTER — Inpatient Hospital Stay: Payer: Medicare PPO | Admitting: Oncology

## 2020-03-05 ENCOUNTER — Inpatient Hospital Stay: Payer: Medicare PPO

## 2020-03-05 ENCOUNTER — Ambulatory Visit: Payer: Medicare PPO | Admitting: Interventional Cardiology

## 2020-03-05 ENCOUNTER — Inpatient Hospital Stay: Payer: Medicare PPO | Attending: Oncology

## 2020-03-05 ENCOUNTER — Encounter: Payer: Self-pay | Admitting: Interventional Cardiology

## 2020-03-05 ENCOUNTER — Other Ambulatory Visit: Payer: Self-pay

## 2020-03-05 VITALS — BP 108/45 | HR 64 | Temp 99.2°F | Resp 20

## 2020-03-05 VITALS — BP 144/56 | HR 71 | Temp 97.8°F | Resp 20 | Ht 67.0 in | Wt 180.6 lb

## 2020-03-05 VITALS — BP 112/52 | HR 72 | Ht 67.0 in | Wt 181.4 lb

## 2020-03-05 DIAGNOSIS — I480 Paroxysmal atrial fibrillation: Secondary | ICD-10-CM

## 2020-03-05 DIAGNOSIS — Z95828 Presence of other vascular implants and grafts: Secondary | ICD-10-CM

## 2020-03-05 DIAGNOSIS — Z5112 Encounter for antineoplastic immunotherapy: Secondary | ICD-10-CM | POA: Diagnosis not present

## 2020-03-05 DIAGNOSIS — C8510 Unspecified B-cell lymphoma, unspecified site: Secondary | ICD-10-CM

## 2020-03-05 DIAGNOSIS — I10 Essential (primary) hypertension: Secondary | ICD-10-CM | POA: Diagnosis not present

## 2020-03-05 DIAGNOSIS — Z7189 Other specified counseling: Secondary | ICD-10-CM

## 2020-03-05 DIAGNOSIS — I609 Nontraumatic subarachnoid hemorrhage, unspecified: Secondary | ICD-10-CM | POA: Diagnosis not present

## 2020-03-05 DIAGNOSIS — C8261 Cutaneous follicle center lymphoma, lymph nodes of head, face, and neck: Secondary | ICD-10-CM | POA: Insufficient documentation

## 2020-03-05 DIAGNOSIS — C73 Malignant neoplasm of thyroid gland: Secondary | ICD-10-CM

## 2020-03-05 DIAGNOSIS — C8267 Cutaneous follicle center lymphoma, spleen: Secondary | ICD-10-CM | POA: Insufficient documentation

## 2020-03-05 DIAGNOSIS — R4702 Dysphasia: Secondary | ICD-10-CM | POA: Diagnosis not present

## 2020-03-05 LAB — CMP (CANCER CENTER ONLY)
ALT: <6 U/L (ref 0–44)
AST: 31 U/L (ref 15–41)
Albumin: 3.7 g/dL (ref 3.5–5.0)
Alkaline Phosphatase: 104 U/L (ref 38–126)
Anion gap: 11 (ref 5–15)
BUN: 14 mg/dL (ref 8–23)
CO2: 23 mmol/L (ref 22–32)
Calcium: 8.8 mg/dL — ABNORMAL LOW (ref 8.9–10.3)
Chloride: 108 mmol/L (ref 98–111)
Creatinine: 1.07 mg/dL — ABNORMAL HIGH (ref 0.44–1.00)
GFR, Est AFR Am: 56 mL/min — ABNORMAL LOW (ref >60)
GFR, Estimated: 48 mL/min — ABNORMAL LOW (ref >60)
Glucose, Bld: 96 mg/dL (ref 70–99)
Potassium: 4.4 mmol/L (ref 3.5–5.1)
Sodium: 142 mmol/L (ref 135–145)
Total Bilirubin: 1.2 mg/dL (ref 0.3–1.2)
Total Protein: 6.5 g/dL (ref 6.5–8.1)

## 2020-03-05 LAB — CBC WITH DIFFERENTIAL (CANCER CENTER ONLY)
Abs Immature Granulocytes: 0.07 10*3/uL (ref 0.00–0.07)
Basophils Absolute: 0.1 10*3/uL (ref 0.0–0.1)
Basophils Relative: 2 %
Eosinophils Absolute: 0.2 10*3/uL (ref 0.0–0.5)
Eosinophils Relative: 3 %
HCT: 39.8 % (ref 36.0–46.0)
Hemoglobin: 13.5 g/dL (ref 12.0–15.0)
Immature Granulocytes: 1 %
Lymphocytes Relative: 50 %
Lymphs Abs: 3.4 10*3/uL (ref 0.7–4.0)
MCH: 35.2 pg — ABNORMAL HIGH (ref 26.0–34.0)
MCHC: 33.9 g/dL (ref 30.0–36.0)
MCV: 103.9 fL — ABNORMAL HIGH (ref 80.0–100.0)
Monocytes Absolute: 0.4 10*3/uL (ref 0.1–1.0)
Monocytes Relative: 6 %
Neutro Abs: 2.5 10*3/uL (ref 1.7–7.7)
Neutrophils Relative %: 38 %
Platelet Count: 154 10*3/uL (ref 150–400)
RBC: 3.83 MIL/uL — ABNORMAL LOW (ref 3.87–5.11)
RDW: 15.7 % — ABNORMAL HIGH (ref 11.5–15.5)
WBC Count: 6.7 10*3/uL (ref 4.0–10.5)
nRBC: 0 % (ref 0.0–0.2)

## 2020-03-05 LAB — LACTATE DEHYDROGENASE: LDH: 430 U/L — ABNORMAL HIGH (ref 98–192)

## 2020-03-05 MED ORDER — HEPARIN SOD (PORK) LOCK FLUSH 100 UNIT/ML IV SOLN
500.0000 [IU] | Freq: Once | INTRAVENOUS | Status: AC | PRN
  Administered 2020-03-05: 500 [IU]
  Filled 2020-03-05: qty 5

## 2020-03-05 MED ORDER — SODIUM CHLORIDE 0.9% FLUSH
10.0000 mL | INTRAVENOUS | Status: DC | PRN
  Administered 2020-03-05: 10 mL
  Filled 2020-03-05: qty 10

## 2020-03-05 MED ORDER — FAMOTIDINE IN NACL 20-0.9 MG/50ML-% IV SOLN
20.0000 mg | Freq: Once | INTRAVENOUS | Status: AC
  Administered 2020-03-05: 20 mg via INTRAVENOUS

## 2020-03-05 MED ORDER — ACETAMINOPHEN 325 MG PO TABS
ORAL_TABLET | ORAL | Status: AC
  Filled 2020-03-05: qty 2

## 2020-03-05 MED ORDER — ACETAMINOPHEN 325 MG PO TABS
650.0000 mg | ORAL_TABLET | Freq: Once | ORAL | Status: AC
  Administered 2020-03-05: 650 mg via ORAL

## 2020-03-05 MED ORDER — DIPHENHYDRAMINE HCL 25 MG PO CAPS
ORAL_CAPSULE | ORAL | Status: AC
  Filled 2020-03-05: qty 2

## 2020-03-05 MED ORDER — SODIUM CHLORIDE 0.9 % IV SOLN
375.0000 mg/m2 | Freq: Once | INTRAVENOUS | Status: AC
  Administered 2020-03-05: 700 mg via INTRAVENOUS
  Filled 2020-03-05: qty 50

## 2020-03-05 MED ORDER — SODIUM CHLORIDE 0.9 % IV SOLN
Freq: Once | INTRAVENOUS | Status: AC
  Filled 2020-03-05: qty 250

## 2020-03-05 MED ORDER — SODIUM CHLORIDE 0.9% FLUSH
10.0000 mL | Freq: Once | INTRAVENOUS | Status: AC
  Administered 2020-03-05: 10 mL
  Filled 2020-03-05: qty 10

## 2020-03-05 MED ORDER — FAMOTIDINE IN NACL 20-0.9 MG/50ML-% IV SOLN
INTRAVENOUS | Status: AC
  Filled 2020-03-05: qty 50

## 2020-03-05 MED ORDER — METHYLPREDNISOLONE SODIUM SUCC 125 MG IJ SOLR
INTRAMUSCULAR | Status: AC
  Filled 2020-03-05: qty 2

## 2020-03-05 MED ORDER — METHYLPREDNISOLONE SODIUM SUCC 125 MG IJ SOLR
125.0000 mg | Freq: Once | INTRAMUSCULAR | Status: AC
  Administered 2020-03-05: 13:00:00 125 mg via INTRAVENOUS

## 2020-03-05 MED ORDER — DIPHENHYDRAMINE HCL 25 MG PO CAPS
50.0000 mg | ORAL_CAPSULE | Freq: Once | ORAL | Status: AC
  Administered 2020-03-05: 50 mg via ORAL

## 2020-03-05 NOTE — Patient Instructions (Signed)
Medication Instructions:  Your physician recommends that you continue on your current medications as directed. Please refer to the Current Medication list given to you today.  *If you need a refill on your cardiac medications before your next appointment, please call your pharmacy*   Lab Work: None If you have labs (blood work) drawn today and your tests are completely normal, you will receive your results only by: . MyChart Message (if you have MyChart) OR . A paper copy in the mail If you have any lab test that is abnormal or we need to change your treatment, we will call you to review the results.   Testing/Procedures: None   Follow-Up: At [facility], you and your health needs are our priority.  As part of our continuing mission to provide you with exceptional heart care, we have created designated Provider Care Teams.  These Care Teams include your primary Cardiologist (physician) and Advanced Practice Providers (APPs -  Physician Assistants and Nurse Practitioners) who all work together to provide you with the care you need, when you need it.  We recommend signing up for the patient portal called "MyChart".  Sign up information is provided on this After Visit Summary.  MyChart is used to connect with patients for Virtual Visits (Telemedicine).  Patients are able to view lab/test results, encounter notes, upcoming appointments, etc.  Non-urgent messages can be sent to your provider as well.   To learn more about what you can do with MyChart, go to [email or web address].    Your next appointment:   12 month(s)  The format for your next appointment:   In Person  Provider:   You may see Henry W Farrugia III, MD or one of the following Advanced Practice Providers on your designated Care Team:    Lori Gerhardt, NP  Laura Ingold, NP  Jill McDaniel, NP    Other Instructions   

## 2020-03-05 NOTE — Patient Instructions (Signed)
[facility] Discharge Instructions for Patients Receiving Chemotherapy  Today you received the following chemotherapy agents: Rituximab   To help prevent nausea and vomiting after your treatment, we encourage you to take your nausea medication  as prescribed.    If you develop nausea and vomiting that is not controlled by your nausea medication, call the clinic.   BELOW ARE SYMPTOMS THAT SHOULD BE REPORTED IMMEDIATELY:  *FEVER GREATER THAN 100.5 F  *CHILLS WITH OR WITHOUT FEVER  NAUSEA AND VOMITING THAT IS NOT CONTROLLED WITH YOUR NAUSEA MEDICATION  *UNUSUAL SHORTNESS OF BREATH  *UNUSUAL BRUISING OR BLEEDING  TENDERNESS IN MOUTH AND THROAT WITH OR WITHOUT PRESENCE OF ULCERS  *URINARY PROBLEMS  *BOWEL PROBLEMS  UNUSUAL RASH Items with * indicate a potential emergency and should be followed up as soon as possible.  Feel free to call the clinic should you have any questions or concerns. The clinic phone number is [phone].  Please show the CHEMO ALERT CARD at check-in to the Emergency Department and triage nurse.   

## 2020-03-05 NOTE — Progress Notes (Signed)
Cardiology Office Note:    Date:  03/05/2020   ID:  Kayleen Memos, DOB 1933/12/24, MRN [id number]  PCP:  [name], PA  Cardiologist:  Sinclair Grooms, MD   Referring MD: [facility] *   Chief Complaint  Patient presents with  . Atrial Fibrillation    History of Present Illness:    Tracy Mclaughlin is a 86 y.o. female with a hx of  stage IV papillary thyroid carcinoma s/p thyroidectomy, postsurgical hypothyroidism, history of paroxysmal atrial fibrillation on ASA, history of PE 11/2011, colon cancer s/p resection, hypertension, GI bleeding, and non-Hodgkin's lymphoma.  She has had no cardiopulmonary complaints.  She does not believe she has had atrial fibrillation in the past 12 months.  She will be seeing Dr.Sherrill later today for follow-up of lymphoma.  She is on aspirin because of significant life-threatening bleeding on factor Xa inhibitor therapy in the past.  Past Medical History:  Diagnosis Date  . Anemia   . Arthritis   . Atrial fibrillation ([place])   . Cancer ([place])    Thyroid/Colon/ B cell Lymphoma-remission  . Diverticulosis   . History of atrial fibrillation    Was in only in a- fib for 2 days  . Hypertension   . Non Hodgkin's lymphoma ([place])    Non-Hodgkin's B-Cell Lymphoma  . Papillary thyroid carcinoma ([place])   . Paroxysmal atrial fibrillation ([facility])   . Post-surgical hypothyroidism   . Pulmonary embolus ([place]) 11/2011  . Subarachnoid hemorrhage ([place]) 1996  . Thyroid disease     Past Surgical History:  Procedure Laterality Date  . APPENDECTOMY  1959  . CHOLECYSTECTOMY  1999   S/P Lap Cholecystectomy  . COLON RESECTION  11/12   mucinous adenocarcinoma of the cecum, resected  . COLONOSCOPY  10/05/2011  . COLONOSCOPY N/A 04/17/2014   Procedure: COLONOSCOPY;  Surgeon: Gatha Mayer, MD;  Location: [facility];  Service: Endoscopy;  Laterality: N/A;  . ESOPHAGOGASTRODUODENOSCOPY N/A 04/17/2014   Procedure:  ESOPHAGOGASTRODUODENOSCOPY (EGD);  Surgeon: Gatha Mayer, MD;  Location: [facility];  Service: Endoscopy;  Laterality: N/A;  . KNEE ARTHROSCOPY Left 1998   S/P  Arthroscopic Left knee surgery  . LYMPH NODE DISSECTION Left 05/04/2019   Procedure: EXCISION DEEP LEFT AXILLARY LYMPH NODES;  Surgeon: Fanny Skates, MD;  Location: [facility];  Service: General;  Laterality: Left;  . PORTACATH PLACEMENT N/A 05/15/2019   Procedure: INSERTION PORT-A-CATH WITH ULTRASOUND;  Surgeon: Fanny Skates, MD;  Location: [facility];  Service: General;  Laterality: N/A;  . ROTATOR CUFF REPAIR Right 1998  . SUBDURAL HEMATOMA EVACUATION VIA CRANIOTOMY    . THYROIDECTOMY  05/23/2012   Procedure: THYROIDECTOMY;  Surgeon: [name], MD;  Location: [facility];  Service: General;  Laterality: N/A;  Total thyroidectomy, central compartment lymph node biopsy times two, reimplantation left superior parathyroid gland.  . TUBOPLASTY / TUBOTUBAL ANASTOMOSIS      Current Medications: Current Meds  Medication Sig  . aspirin EC 81 MG tablet Take 81 mg by mouth daily.  . citalopram (CELEXA) 40 MG tablet Take 40 mg by mouth daily.  Marland Kitchen diltiazem (DILACOR XR) 180 MG 24 hr capsule Take 1 capsule (180 mg total) by mouth daily.  . diphenhydrAMINE (BENADRYL) 25 MG tablet Take 25 mg by mouth at bedtime as needed. For itching/sleep  . lenalidomide (REVLIMID) 10 MG capsule Take 1 capsule (10 mg total) by mouth daily. Take for 14 days on, 14 days off, repeat every 28d.  Marland Kitchen  levothyroxine (SYNTHROID, LEVOTHROID) 112 MCG tablet Take 224 mcg by mouth daily before breakfast. Pt takes two 112 mcg tablets to equal 224 mcg once a day  . ondansetron (ZOFRAN) 8 MG tablet Take 1 tablet (8 mg total) by mouth every 8 (eight) hours as needed for nausea or vomiting.     Allergies:   Demerol [meperidine hcl]   Social History   Socioeconomic History  . Marital status: Married    Spouse name: Not on file  . Number of children: 0  . Years of education:  Not on file  . Highest education level: Not on file  Occupational History  . Occupation: retired  Tobacco Use  . Smoking status: Former Smoker    Packs/day: 1.00    Years: 12.00    Pack years: 12.00    Types: Cigarettes    Quit date: 08/23/1969    Years since quitting: 50.5  . Smokeless tobacco: Never Used  Substance and Sexual Activity  . Alcohol use: Not Currently    Comment: 1 manhattens per night- Last had 3 months ago  . Drug use: No  . Sexual activity: Not on file  Other Topics Concern  . Not on file  Social History Narrative  . Not on file   Social Determinants of Health   Financial Resource Strain:   . Difficulty of Paying Living Expenses:   Food Insecurity:   . Worried About Charity fundraiser in the Last Year:   . Arboriculturist in the Last Year:   Transportation Needs:   . Film/video editor (Medical):   Marland Kitchen Lack of Transportation (Non-Medical):   Physical Activity:   . Days of Exercise per Week:   . Minutes of Exercise per Session:   Stress:   . Feeling of Stress :   Social Connections:   . Frequency of Communication with Friends and Family:   . Frequency of Social Gatherings with Friends and Family:   . Attends Religious Services:   . Active Member of Clubs or Organizations:   . Attends Archivist Meetings:   Marland Kitchen Marital Status:      Family History: The patient's family history includes Coronary artery disease in her mother; Diabetes in her mother and sister; Hypertension in her mother and sister; Kidney cancer in her sister; Lung cancer in her brother; Stroke in her sister. There is no history of Anesthesia problems or Heart attack.  ROS:   Please see the history of present illness.    She has not had bleeding.  She feels fine from cardiac standpoint.  Appetite is stable.  She does have some dyspnea on exertion but this is unchanged.  All other systems reviewed and are negative.  EKGs/Labs/Other Studies Reviewed:    The following studies  were reviewed today: No recent or new imaging data.  EKG:  EKG normal sinus rhythm with left axis deviation.  Nonspecific T wave flattening.  No acute change since the prior tracing.  Recent Labs: 08/02/2019: TSH 1.250 02/01/2020: ALT 9; BUN 14; Creatinine 0.92; Hemoglobin 12.5; Platelet Count 114; Potassium 4.4; Sodium 142  Recent Lipid Panel No results found for: CHOL, TRIG, HDL, CHOLHDL, VLDL, LDLCALC, LDLDIRECT  Physical Exam:    VS:  BP (!) 112/52   Pulse 72   Ht 5\' 7"  (1.702 m)   Wt 181 lb 6.4 oz (82.3 kg)   SpO2 95%   BMI 28.41 kg/m     Wt Readings from Last 3 Encounters:  03/05/20 181 lb 6.4 oz (82.3 kg)  02/01/20 179 lb 4.8 oz (81.3 kg)  01/04/20 179 lb 14.4 oz (81.6 kg)     GEN: Healthy-appearing and no significant change in appearance. No acute distress HEENT: Normal NECK: No JVD. LYMPHATICS: No lymphadenopathy CARDIAC:  IIRR without murmur, gallop, or edema. VASCULAR:  Normal Pulses. No bruits. RESPIRATORY:  Clear to auscultation without rales, wheezing or rhonchi  ABDOMEN: Soft, non-tender, non-distended, No pulsatile mass, MUSCULOSKELETAL: No deformity  SKIN: Warm and dry NEUROLOGIC:  Alert and oriented x 3 PSYCHIATRIC:  Normal affect   ASSESSMENT:    1. PAF (paroxysmal atrial fibrillation) ([place])   2. Essential hypertension   3. Subarachnoid hemorrhage ([place])   4. Educated about COVID-19 virus infection    PLAN:    In order of problems listed above:  1. No atrial fibrillation clinically or symptomatically in over a year. 2. Blood pressure is excellently controlled with diltiazem serving to slow rate if A. fib and controlled blood pressure. 3. Prior bleeding tendencies have led to inability to use anticoagulation despite her relatively high CHA2DS2-VASc score. 4. She has been vaccinated and they are practicing social distancing.  Monitor for any recurrent symptoms of atrial fibrillation.  Return to see me in 1 year.   Medication Adjustments/Labs  and Tests Ordered: Current medicines are reviewed at length with the patient today.  Concerns regarding medicines are outlined above.  Orders Placed This Encounter  Procedures  . EKG 12-Lead   No orders of the defined types were placed in this encounter.   Patient Instructions  Medication Instructions:  Your physician recommends that you continue on your current medications as directed. Please refer to the Current Medication list given to you today.  *If you need a refill on your cardiac medications before your next appointment, please call your pharmacy*   Lab Work: None If you have labs (blood work) drawn today and your tests are completely normal, you will receive your results only by: Marland Kitchen MyChart Message (if you have MyChart) OR . A paper copy in the mail If you have any lab test that is abnormal or we need to change your treatment, we will call you to review the results.   Testing/Procedures: None   Follow-Up: At [facility], you and your health needs are our priority.  As part of our continuing mission to provide you with exceptional heart care, we have created designated Provider Care Teams.  These Care Teams include your primary Cardiologist (physician) and Advanced Practice Providers (APPs -  Physician Assistants and Nurse Practitioners) who all work together to provide you with the care you need, when you need it.  We recommend signing up for the patient portal called "MyChart".  Sign up information is provided on this After Visit Summary.  MyChart is used to connect with patients for Virtual Visits (Telemedicine).  Patients are able to view lab/test results, encounter notes, upcoming appointments, etc.  Non-urgent messages can be sent to your provider as well.   To learn more about what you can do with MyChart, go to [email or web address].    Your next appointment:   12 month(s)  The format for your next appointment:   In Person  Provider:   You may see Sinclair Grooms, MD or one of the following Advanced Practice Providers on your designated Care Team:    Truitt Merle, NP  Cecilie Kicks, NP  Kathyrn Drown, NP    Other Instructions  Signed, Sinclair Grooms, MD  03/05/2020 10:11 AM    [place]

## 2020-03-05 NOTE — Progress Notes (Signed)
[place] OFFICE PROGRESS NOTE   Diagnosis: Non-Hodgkin's lymphoma  INTERVAL HISTORY:   Tracy Mclaughlin completed another cycle of Revlimid/rituximab beginning 02/01/2020.  No symptom of an allergic reaction.  No consistent nausea or diarrhea.  She has a good appetite.  Stable exertional dyspnea.  Stable palpable lymph nodes.  Objective:  Vital signs in last 24 hours:  Blood pressure (!) 144/56, pulse 71, temperature 97.8 F (36.6 C), temperature source Temporal, resp. rate 20, height 5\' 7"  (1.702 m), weight 180 lb 9.6 oz (81.9 kg), SpO2 100 %.    Lymphatics: Soft mobile 1-2 cm bilateral low cervical/supraclavicular and right axillary nodes.  3 cm left axillary node Resp: Lungs clear bilaterally Cardio: Regular rate and rhythm GI: No hepatosplenomegaly, no mass, nontender Vascular: No leg edema  Portacath/PICC-without erythema  Lab Results:  Lab Results  Component Value Date   WBC 6.7 03/05/2020   HGB 13.5 03/05/2020   HCT 39.8 03/05/2020   MCV 103.9 (H) 03/05/2020   PLT 154 03/05/2020   NEUTROABS 2.5 03/05/2020    CMP  Lab Results  Component Value Date   NA 142 03/05/2020   K 4.4 03/05/2020   CL 108 03/05/2020   CO2 23 03/05/2020   GLUCOSE 96 03/05/2020   BUN 14 03/05/2020   CREATININE 1.07 (H) 03/05/2020   CALCIUM 8.8 (L) 03/05/2020   PROT 6.5 03/05/2020   ALBUMIN 3.7 03/05/2020   AST 31 03/05/2020   ALT <6 03/05/2020   ALKPHOS 104 03/05/2020   BILITOT 1.2 03/05/2020   GFRNONAA 48 (L) 03/05/2020   GFRAA 56 (L) 03/05/2020    Lab Results  Component Value Date   CEA1 3.16 07/22/2016   CEA1 2.7 07/22/2016     Medications: I have reviewed the patient's current medications.   Assessment/Plan:  1. 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 Status post weekly Rituxan x4 03/01/2012 through 03/22/2012. She completed 4 "maintenance" doses  of Rituxan, last on 12/19/2012. A restaging CT on 02/09/2013 showed no evidence of lymphoma.   Lymph node lateral to the thyroid bed on a neck ultrasound 02/21/2014, status post an FNA biopsy concerning for a lymphoproliferative disorder.  PET scan 09/28/2016 with active lymphoma within the neck, chest, abdomen, pelvis; splenic enlargement and hypermetabolism suspicious for splenic involvement.  Initiation of Rituxan weekly 4 09/29/2016  Initiation of maintenance Rituxan on a 3 month schedule 12/23/2016; final Rituxan 08/31/2018  Thyroid ultrasound 02/07/2019-left cervical lymphadenopathy  PET scan 03/08/2019-extensive recurrent hypermetabolic lymphoma involving the neck, chest, abdomen and pelvis.  03/16/2019 left cervical lymph node biopsy-features consistent with previously diagnosed non-Hodgkin's B-cell lymphoma, phenotypically consistent with marginal zone lymphoma. Flow cytometry with lambda restricted B-cell population without expression of CD5 or CD10 comprising 87% of all lymphocytes.  Cycle 1 bendamustine/Rituxan 03/22/2019  Excision deep left axillary lymph nodes 05/04/2019-non-Hodgkin's B-cell lymphoma with differential including a marginal zone lymphoma and atypical small lymphocytic lymphoma. Flow cytometry with monoclonal B-cell population without expression of CD5 or CD10, comprises 96% of all lymphocytes.  Cycle 1 CHOP/Rituxan 05/18/2019  Cycle 2 CHOP/Rituxan 06/08/2019  CTs 06/15/2019-partial improvement in diffuse adenopathy, stable mild splenomegaly  Cycle 1 Revlimid/rituximab 07/19/2019 (Revlimid start 07/20/2019)  Cycle 2 Revlimid/rituximab 08/16/2019 (Revlimid placed on hold 08/30/2019 due to neutropenia)  Cycle 3 of Revlimid/rituximab 09/13/2019 (Revlimid schedule changed to 14 days on/14 days off)  Cycle 4 Revlimid/rituximab 10/10/2019  Cycle 5 Revlimid/rituximab 11/07/2019  Cycle 6 Revlimid/rituximab 12/07/2019  CTs 12/28/2019-diffuse  lymphadenopathy-slightly  increased compared to 06/15/2019  Cycle 7 Revlimid/rituximab 01/04/2020  Cycle 8 Revlimid/Rituxan 02/01/2020  Cycle 9 Revlimid/Rituxan 03/05/2020 2. Stage IV (T1bN1b M0) papillary thyroid cancer, status post a thyroidectomy with reimplantation of the left superior parathyroid gland on 05/23/2012, status post radioactive iodine therapy, followed by Dr. Buddy Duty.  3. Stage II (T3 N0) colon cancer, status post a right colectomy 10/19/2011, last colonoscopy April 2015-sigmoid adenoma removed.  4. History of a pulmonary embolism December 2012.  5. History of Atrial fibrillation 6. Iron deficiency anemia-new 03/18/2014. Hemoccult positive stool. The anemia corrected with iron. No longer taking iron.  Status post an upper endoscopy and colonoscopy by Dr. Carlean Purl April 2015 with no bleeding source identified, benign adenoma removed from the sigmoid colon. 7. Report of an upper gastric intestinal bleed fall 2016-managed in [place]. [name] 8. Left knee replacement May 2017, repeat left knee surgery May 2018 9. Pruritic rash 07/22/2016 10. Nausea and diarrhea 04/02/2019-stool negative for C. difficile toxin 11. 06/18/2019 [facility] admission for symptomatic anemia.    Disposition: Ms. Endlich appears unchanged.  She has stable palpable lymphadenopathy and mild elevation of the LDH.  Her clinical status has stabilized since beginning Revlimid/rituximab in July 2020.  The plan is to continue the current treatment. She will complete another cycle beginning today.  She will return for an office visit in 4 weeks.  Betsy Coder, MD  03/05/2020  11:30 AM

## 2020-03-07 ENCOUNTER — Telehealth: Payer: Self-pay | Admitting: Oncology

## 2020-03-07 NOTE — Telephone Encounter (Signed)
Scheduled per los. Called and spoke with patients husband. Confirmed appts 

## 2020-03-27 ENCOUNTER — Other Ambulatory Visit: Payer: Self-pay | Admitting: *Deleted

## 2020-03-27 DIAGNOSIS — C8510 Unspecified B-cell lymphoma, unspecified site: Secondary | ICD-10-CM

## 2020-03-27 MED ORDER — LENALIDOMIDE 10 MG PO CAPS: 10.0000 mg | ORAL_CAPSULE | Freq: Every day | ORAL | 0 refills | Status: DC

## 2020-03-27 NOTE — Progress Notes (Signed)
Pharmacist Chemotherapy Monitoring - Follow Up Assessment    I verify that I have reviewed each item in the below checklist:  . Regimen for the patient is scheduled for the appropriate day and plan matches scheduled date. Marland Kitchen Appropriate non-routine labs are ordered dependent on drug ordered. . If applicable, additional medications reviewed and ordered per protocol based on lifetime cumulative doses and/or treatment regimen.   Plan for follow-up and/or issues identified: Yes . I-vent associated with next due treatment: Yes . MD and/or nursing notified: No  Tracy Mclaughlin K 03/27/2020 11:03 AM

## 2020-04-02 ENCOUNTER — Inpatient Hospital Stay: Payer: Medicare PPO

## 2020-04-02 ENCOUNTER — Inpatient Hospital Stay: Payer: Medicare PPO | Attending: Oncology | Admitting: Oncology

## 2020-04-02 ENCOUNTER — Other Ambulatory Visit: Payer: Self-pay

## 2020-04-02 VITALS — BP 105/52 | HR 61 | Resp 18

## 2020-04-02 VITALS — BP 117/53 | HR 74 | Temp 97.8°F | Resp 17 | Ht 67.0 in | Wt 179.2 lb

## 2020-04-02 DIAGNOSIS — C8510 Unspecified B-cell lymphoma, unspecified site: Secondary | ICD-10-CM

## 2020-04-02 DIAGNOSIS — C73 Malignant neoplasm of thyroid gland: Secondary | ICD-10-CM

## 2020-04-02 DIAGNOSIS — C8261 Cutaneous follicle center lymphoma, lymph nodes of head, face, and neck: Secondary | ICD-10-CM | POA: Diagnosis present

## 2020-04-02 DIAGNOSIS — Z95828 Presence of other vascular implants and grafts: Secondary | ICD-10-CM

## 2020-04-02 DIAGNOSIS — Z5112 Encounter for antineoplastic immunotherapy: Secondary | ICD-10-CM | POA: Insufficient documentation

## 2020-04-02 DIAGNOSIS — C8267 Cutaneous follicle center lymphoma, spleen: Secondary | ICD-10-CM | POA: Diagnosis present

## 2020-04-02 LAB — CMP (CANCER CENTER ONLY)
ALT: 10 U/L (ref 0–44)
AST: 25 U/L (ref 15–41)
Albumin: 3.4 g/dL — ABNORMAL LOW (ref 3.5–5.0)
Alkaline Phosphatase: 96 U/L (ref 38–126)
Anion gap: 8 (ref 5–15)
BUN: 14 mg/dL (ref 8–23)
CO2: 25 mmol/L (ref 22–32)
Calcium: 8.6 mg/dL — ABNORMAL LOW (ref 8.9–10.3)
Chloride: 109 mmol/L (ref 98–111)
Creatinine: 1.13 mg/dL — ABNORMAL HIGH (ref 0.44–1.00)
GFR, Est AFR Am: 52 mL/min — ABNORMAL LOW (ref >60)
GFR, Estimated: 45 mL/min — ABNORMAL LOW (ref >60)
Glucose, Bld: 111 mg/dL — ABNORMAL HIGH (ref 70–99)
Potassium: 4.3 mmol/L (ref 3.5–5.1)
Sodium: 142 mmol/L (ref 135–145)
Total Bilirubin: 0.9 mg/dL (ref 0.3–1.2)
Total Protein: 6.3 g/dL — ABNORMAL LOW (ref 6.5–8.1)

## 2020-04-02 LAB — CBC WITH DIFFERENTIAL (CANCER CENTER ONLY)
Abs Immature Granulocytes: 0.12 10*3/uL — ABNORMAL HIGH (ref 0.00–0.07)
Basophils Absolute: 0.1 10*3/uL (ref 0.0–0.1)
Basophils Relative: 2 %
Eosinophils Absolute: 0.3 10*3/uL (ref 0.0–0.5)
Eosinophils Relative: 5 %
HCT: 37.9 % (ref 36.0–46.0)
Hemoglobin: 12.9 g/dL (ref 12.0–15.0)
Immature Granulocytes: 2 %
Lymphocytes Relative: 52 %
Lymphs Abs: 3.4 10*3/uL (ref 0.7–4.0)
MCH: 35.3 pg — ABNORMAL HIGH (ref 26.0–34.0)
MCHC: 34 g/dL (ref 30.0–36.0)
MCV: 103.8 fL — ABNORMAL HIGH (ref 80.0–100.0)
Monocytes Absolute: 0.3 10*3/uL (ref 0.1–1.0)
Monocytes Relative: 5 %
Neutro Abs: 2.2 10*3/uL (ref 1.7–7.7)
Neutrophils Relative %: 34 %
Platelet Count: 180 10*3/uL (ref 150–400)
RBC: 3.65 MIL/uL — ABNORMAL LOW (ref 3.87–5.11)
RDW: 14.4 % (ref 11.5–15.5)
WBC Count: 6.5 10*3/uL (ref 4.0–10.5)
nRBC: 0 % (ref 0.0–0.2)

## 2020-04-02 LAB — LACTATE DEHYDROGENASE: LDH: 392 U/L — ABNORMAL HIGH (ref 98–192)

## 2020-04-02 MED ORDER — HEPARIN SOD (PORK) LOCK FLUSH 100 UNIT/ML IV SOLN
500.0000 [IU] | Freq: Once | INTRAVENOUS | Status: AC | PRN
  Administered 2020-04-02: 500 [IU]
  Filled 2020-04-02: qty 5

## 2020-04-02 MED ORDER — ACETAMINOPHEN 325 MG PO TABS
650.0000 mg | ORAL_TABLET | Freq: Once | ORAL | Status: AC
  Administered 2020-04-02: 650 mg via ORAL

## 2020-04-02 MED ORDER — DIPHENHYDRAMINE HCL 25 MG PO CAPS
ORAL_CAPSULE | ORAL | Status: AC
  Filled 2020-04-02: qty 2

## 2020-04-02 MED ORDER — HEPARIN SOD (PORK) LOCK FLUSH 100 UNIT/ML IV SOLN
500.0000 [IU] | Freq: Once | INTRAVENOUS | Status: DC
  Filled 2020-04-02: qty 5

## 2020-04-02 MED ORDER — SODIUM CHLORIDE 0.9 % IV SOLN
375.0000 mg/m2 | Freq: Once | INTRAVENOUS | Status: AC
  Administered 2020-04-02: 700 mg via INTRAVENOUS
  Filled 2020-04-02: qty 50

## 2020-04-02 MED ORDER — FAMOTIDINE IN NACL 20-0.9 MG/50ML-% IV SOLN
INTRAVENOUS | Status: AC
  Filled 2020-04-02: qty 50

## 2020-04-02 MED ORDER — ACETAMINOPHEN 325 MG PO TABS
ORAL_TABLET | ORAL | Status: AC
  Filled 2020-04-02: qty 2

## 2020-04-02 MED ORDER — SODIUM CHLORIDE 0.9 % IV SOLN
Freq: Once | INTRAVENOUS | Status: AC
  Filled 2020-04-02: qty 250

## 2020-04-02 MED ORDER — SODIUM CHLORIDE 0.9% FLUSH
10.0000 mL | Freq: Once | INTRAVENOUS | Status: DC
  Filled 2020-04-02: qty 10

## 2020-04-02 MED ORDER — FAMOTIDINE IN NACL 20-0.9 MG/50ML-% IV SOLN
20.0000 mg | Freq: Once | INTRAVENOUS | Status: AC
  Administered 2020-04-02: 20 mg via INTRAVENOUS

## 2020-04-02 MED ORDER — METHYLPREDNISOLONE SODIUM SUCC 125 MG IJ SOLR
INTRAMUSCULAR | Status: AC
  Filled 2020-04-02: qty 2

## 2020-04-02 MED ORDER — METHYLPREDNISOLONE SODIUM SUCC 125 MG IJ SOLR
125.0000 mg | Freq: Once | INTRAMUSCULAR | Status: AC
  Administered 2020-04-02: 125 mg via INTRAVENOUS

## 2020-04-02 MED ORDER — DIPHENHYDRAMINE HCL 25 MG PO CAPS
50.0000 mg | ORAL_CAPSULE | Freq: Once | ORAL | Status: AC
  Administered 2020-04-02: 50 mg via ORAL

## 2020-04-02 MED ORDER — SODIUM CHLORIDE 0.9% FLUSH
10.0000 mL | INTRAVENOUS | Status: DC | PRN
  Administered 2020-04-02: 10 mL
  Filled 2020-04-02: qty 10

## 2020-04-02 NOTE — Patient Instructions (Signed)
[facility] °Discharge Instructions for Patients Receiving Chemotherapy ° °Today you received the following chemotherapy agents: Rituximab ° °To help prevent nausea and vomiting after your treatment, we encourage you to take your nausea medication as directed.  °  °If you develop nausea and vomiting that is not controlled by your nausea medication, call the clinic.  ° °BELOW ARE SYMPTOMS THAT SHOULD BE REPORTED IMMEDIATELY: °· *FEVER GREATER THAN 100.5 F °· *CHILLS WITH OR WITHOUT FEVER °· NAUSEA AND VOMITING THAT IS NOT CONTROLLED WITH YOUR NAUSEA MEDICATION °· *UNUSUAL SHORTNESS OF BREATH °· *UNUSUAL BRUISING OR BLEEDING °· TENDERNESS IN MOUTH AND THROAT WITH OR WITHOUT PRESENCE OF ULCERS °· *URINARY PROBLEMS °· *BOWEL PROBLEMS °· UNUSUAL RASH °Items with * indicate a potential emergency and should be followed up as soon as possible. ° °Feel free to call the clinic should you have any questions or concerns. The clinic phone number is [phone]. ° °Please show the CHEMO ALERT CARD at check-in to the Emergency Department and triage nurse. ° ° °

## 2020-04-02 NOTE — Progress Notes (Signed)
[place] OFFICE PROGRESS NOTE   Diagnosis: Non-Hodgkin's lymphoma  INTERVAL HISTORY:   Tracy Mclaughlin completed another cycle of Revlimid/rituximab beginning 03/05/2020.  No fever.  Variable appetite.  She believes the left neck lymph nodes are smaller.  No new complaint.  Objective:  Vital signs in last 24 hours:  Blood pressure (!) 117/53, pulse 74, temperature 97.8 F (36.6 C), temperature source Temporal, resp. rate 17, height 5\' 7"  (1.702 m), weight 179 lb 3.2 oz (81.3 kg), SpO2 97 %.     Lymphatics: 1-2 cm bilateral cervical/supraclavicular nodes, 3-4 centimeter left axillary node Resp: Lungs clear bilaterally Cardio: Regular rate and rhythm GI: No hepatosplenomegaly, no mass, nontender Vascular: No leg edema   Portacath/PICC-without erythema  Lab Results:  Lab Results  Component Value Date   WBC 6.5 04/02/2020   HGB 12.9 04/02/2020   HCT 37.9 04/02/2020   MCV 103.8 (H) 04/02/2020   PLT 180 04/02/2020   NEUTROABS 2.2 04/02/2020    CMP  Lab Results  Component Value Date   NA 142 04/02/2020   K 4.3 04/02/2020   CL 109 04/02/2020   CO2 25 04/02/2020   GLUCOSE 111 (H) 04/02/2020   BUN 14 04/02/2020   CREATININE 1.13 (H) 04/02/2020   CALCIUM 8.6 (L) 04/02/2020   PROT 6.3 (L) 04/02/2020   ALBUMIN 3.4 (L) 04/02/2020   AST 25 04/02/2020   ALT 10 04/02/2020   ALKPHOS 96 04/02/2020   BILITOT 0.9 04/02/2020   GFRNONAA 45 (L) 04/02/2020   GFRAA 52 (L) 04/02/2020    Medications: I have reviewed the patient's current medications.   Assessment/Plan: 1. 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 Status post weekly Rituxan x4 03/01/2012 through 03/22/2012. She completed 4 "maintenance" doses of Rituxan, last on 12/19/2012. A restaging CT on 02/09/2013 showed no evidence of lymphoma.   Lymph node lateral to the thyroid bed on a neck ultrasound  02/21/2014, status post an FNA biopsy concerning for a lymphoproliferative disorder.  PET scan 09/28/2016 with active lymphoma within the neck, chest, abdomen, pelvis; splenic enlargement and hypermetabolism suspicious for splenic involvement.  Initiation of Rituxan weekly 4 09/29/2016  Initiation of maintenance Rituxan on a 3 month schedule 12/23/2016; final Rituxan 08/31/2018  Thyroid ultrasound 02/07/2019-left cervical lymphadenopathy  PET scan 03/08/2019-extensive recurrent hypermetabolic lymphoma involving the neck, chest, abdomen and pelvis.  03/16/2019 left cervical lymph node biopsy-features consistent with previously diagnosed non-Hodgkin's B-cell lymphoma, phenotypically consistent with marginal zone lymphoma. Flow cytometry with lambda restricted B-cell population without expression of CD5 or CD10 comprising 87% of all lymphocytes.  Cycle 1 bendamustine/Rituxan 03/22/2019  Excision deep left axillary lymph nodes 05/04/2019-non-Hodgkin's B-cell lymphoma with differential including a marginal zone lymphoma and atypical small lymphocytic lymphoma. Flow cytometry with monoclonal B-cell population without expression of CD5 or CD10, comprises 96% of all lymphocytes.  Cycle 1 CHOP/Rituxan 05/18/2019  Cycle 2 CHOP/Rituxan 06/08/2019  CTs 06/15/2019-partial improvement in diffuse adenopathy, stable mild splenomegaly  Cycle 1 Revlimid/rituximab 07/19/2019 (Revlimid start 07/20/2019)  Cycle 2 Revlimid/rituximab 08/16/2019 (Revlimid placed on hold 08/30/2019 due to neutropenia)  Cycle 3 of Revlimid/rituximab 09/13/2019 (Revlimid schedule changed to 14 days on/14 days off)  Cycle 4 Revlimid/rituximab 10/10/2019  Cycle 5 Revlimid/rituximab 11/07/2019  Cycle 6 Revlimid/rituximab 12/07/2019  CTs 12/28/2019-diffuse lymphadenopathy-slightly increased compared to 06/15/2019  Cycle 7 Revlimid/rituximab 01/04/2020  Cycle 8 Revlimid/Rituxan 02/01/2020  Cycle 9 Revlimid/Rituxan 03/05/2020  Cycle  10 Revlimid/Rituxan 04/02/2020 2. Stage IV (T1bN1b M0)  papillary thyroid cancer, status post a thyroidectomy with reimplantation of the left superior parathyroid gland on 05/23/2012, status post radioactive iodine therapy, followed by Dr. Buddy Duty.  3. Stage II (T3 N0) colon cancer, status post a right colectomy 10/19/2011, last colonoscopy April 2015-sigmoid adenoma removed.  4. History of a pulmonary embolism December 2012.  5. History of Atrial fibrillation 6. Iron deficiency anemia-new 03/18/2014. Hemoccult positive stool. The anemia corrected with iron. No longer taking iron.  Status post an upper endoscopy and colonoscopy by Dr. Carlean Purl April 2015 with no bleeding source identified, benign adenoma removed from the sigmoid colon. 7. Report of an upper gastric intestinal bleed fall 2016-managed in [place]. [name] 8. Left knee replacement May 2017, repeat left knee surgery May 2018 9. Pruritic rash 07/22/2016 10. Nausea and diarrhea 04/02/2019-stool negative for C. difficile toxin 11. 06/18/2019 [facility] admission for symptomatic anemia.      Disposition: Tracy Mclaughlin appears unchanged.  The plan is to continue Revlimid/rituximab.  She will complete another cycle of rituximab today.  We will consider treatment with ibrutinib if she has disease progression on the current regimen.  Betsy Coder, MD  04/02/2020  11:59 AM

## 2020-04-07 ENCOUNTER — Telehealth: Payer: Self-pay | Admitting: Oncology

## 2020-04-07 NOTE — Telephone Encounter (Signed)
Scheduled per los. Called and spoke with husband. Confirmed appt °

## 2020-04-23 ENCOUNTER — Other Ambulatory Visit: Payer: Self-pay | Admitting: *Deleted

## 2020-04-23 DIAGNOSIS — C8510 Unspecified B-cell lymphoma, unspecified site: Secondary | ICD-10-CM

## 2020-04-23 MED ORDER — LENALIDOMIDE 10 MG PO CAPS: 10.0000 mg | ORAL_CAPSULE | Freq: Every day | ORAL | 0 refills | Status: DC

## 2020-04-23 NOTE — Progress Notes (Signed)
Refilled Revlimid.

## 2020-04-24 NOTE — Progress Notes (Signed)
Pharmacist Chemotherapy Monitoring - Follow Up Assessment    I verify that I have reviewed each item in the below checklist:  . Regimen for the patient is scheduled for the appropriate day and plan matches scheduled date. Marland Kitchen Appropriate non-routine labs are ordered dependent on drug ordered. . If applicable, additional medications reviewed and ordered per protocol based on lifetime cumulative doses and/or treatment regimen.   Plan for follow-up and/or issues identified: Yes . I-vent associated with next due treatment: Yes . MD and/or nursing notified: No  Tracy Mclaughlin K 04/24/2020 11:11 AM

## 2020-04-27 ENCOUNTER — Other Ambulatory Visit: Payer: Self-pay | Admitting: Oncology

## 2020-04-30 ENCOUNTER — Inpatient Hospital Stay: Payer: Medicare PPO

## 2020-04-30 ENCOUNTER — Inpatient Hospital Stay: Payer: Medicare PPO | Attending: Oncology | Admitting: Oncology

## 2020-04-30 ENCOUNTER — Other Ambulatory Visit: Payer: Self-pay

## 2020-04-30 VITALS — BP 129/53 | HR 76 | Temp 97.8°F | Resp 17 | Ht 67.0 in | Wt 178.1 lb

## 2020-04-30 VITALS — BP 115/45 | HR 60 | Temp 98.4°F | Resp 18

## 2020-04-30 DIAGNOSIS — C8261 Cutaneous follicle center lymphoma, lymph nodes of head, face, and neck: Secondary | ICD-10-CM | POA: Insufficient documentation

## 2020-04-30 DIAGNOSIS — Z95828 Presence of other vascular implants and grafts: Secondary | ICD-10-CM

## 2020-04-30 DIAGNOSIS — C8510 Unspecified B-cell lymphoma, unspecified site: Secondary | ICD-10-CM

## 2020-04-30 DIAGNOSIS — C73 Malignant neoplasm of thyroid gland: Secondary | ICD-10-CM

## 2020-04-30 DIAGNOSIS — C8267 Cutaneous follicle center lymphoma, spleen: Secondary | ICD-10-CM | POA: Insufficient documentation

## 2020-04-30 DIAGNOSIS — Z5112 Encounter for antineoplastic immunotherapy: Secondary | ICD-10-CM | POA: Insufficient documentation

## 2020-04-30 LAB — CBC WITH DIFFERENTIAL (CANCER CENTER ONLY)
Abs Immature Granulocytes: 0.11 10*3/uL — ABNORMAL HIGH (ref 0.00–0.07)
Basophils Absolute: 0.2 10*3/uL — ABNORMAL HIGH (ref 0.0–0.1)
Basophils Relative: 3 %
Eosinophils Absolute: 0.3 10*3/uL (ref 0.0–0.5)
Eosinophils Relative: 4 %
HCT: 39.1 % (ref 36.0–46.0)
Hemoglobin: 13.1 g/dL (ref 12.0–15.0)
Immature Granulocytes: 2 %
Lymphocytes Relative: 53 %
Lymphs Abs: 3.8 10*3/uL (ref 0.7–4.0)
MCH: 34.6 pg — ABNORMAL HIGH (ref 26.0–34.0)
MCHC: 33.5 g/dL (ref 30.0–36.0)
MCV: 103.2 fL — ABNORMAL HIGH (ref 80.0–100.0)
Monocytes Absolute: 0.5 10*3/uL (ref 0.1–1.0)
Monocytes Relative: 7 %
Neutro Abs: 2.2 10*3/uL (ref 1.7–7.7)
Neutrophils Relative %: 31 %
Platelet Count: 162 10*3/uL (ref 150–400)
RBC: 3.79 MIL/uL — ABNORMAL LOW (ref 3.87–5.11)
RDW: 14.4 % (ref 11.5–15.5)
WBC Count: 7 10*3/uL (ref 4.0–10.5)
nRBC: 0 % (ref 0.0–0.2)

## 2020-04-30 LAB — CMP (CANCER CENTER ONLY)
ALT: 10 U/L (ref 0–44)
AST: 29 U/L (ref 15–41)
Albumin: 3.5 g/dL (ref 3.5–5.0)
Alkaline Phosphatase: 108 U/L (ref 38–126)
Anion gap: 12 (ref 5–15)
BUN: 13 mg/dL (ref 8–23)
CO2: 22 mmol/L (ref 22–32)
Calcium: 8.6 mg/dL — ABNORMAL LOW (ref 8.9–10.3)
Chloride: 107 mmol/L (ref 98–111)
Creatinine: 1.09 mg/dL — ABNORMAL HIGH (ref 0.44–1.00)
GFR, Est AFR Am: 54 mL/min — ABNORMAL LOW (ref >60)
GFR, Estimated: 47 mL/min — ABNORMAL LOW (ref >60)
Glucose, Bld: 99 mg/dL (ref 70–99)
Potassium: 4.2 mmol/L (ref 3.5–5.1)
Sodium: 141 mmol/L (ref 135–145)
Total Bilirubin: 0.9 mg/dL (ref 0.3–1.2)
Total Protein: 6.3 g/dL — ABNORMAL LOW (ref 6.5–8.1)

## 2020-04-30 LAB — LACTATE DEHYDROGENASE: LDH: 391 U/L — ABNORMAL HIGH (ref 98–192)

## 2020-04-30 MED ORDER — HEPARIN SOD (PORK) LOCK FLUSH 100 UNIT/ML IV SOLN
500.0000 [IU] | Freq: Once | INTRAVENOUS | Status: AC | PRN
  Administered 2020-04-30: 500 [IU]
  Filled 2020-04-30: qty 5

## 2020-04-30 MED ORDER — METHYLPREDNISOLONE SODIUM SUCC 125 MG IJ SOLR
125.0000 mg | Freq: Once | INTRAMUSCULAR | Status: AC
  Administered 2020-04-30: 125 mg via INTRAVENOUS

## 2020-04-30 MED ORDER — SODIUM CHLORIDE 0.9 % IV SOLN
Freq: Once | INTRAVENOUS | Status: AC
  Filled 2020-04-30: qty 250

## 2020-04-30 MED ORDER — DIPHENHYDRAMINE HCL 25 MG PO CAPS
ORAL_CAPSULE | ORAL | Status: AC
  Filled 2020-04-30: qty 2

## 2020-04-30 MED ORDER — METHYLPREDNISOLONE SODIUM SUCC 125 MG IJ SOLR
INTRAMUSCULAR | Status: AC
  Filled 2020-04-30: qty 2

## 2020-04-30 MED ORDER — SODIUM CHLORIDE 0.9 % IV SOLN
375.0000 mg/m2 | Freq: Once | INTRAVENOUS | Status: AC
  Administered 2020-04-30: 700 mg via INTRAVENOUS
  Filled 2020-04-30: qty 50

## 2020-04-30 MED ORDER — FAMOTIDINE IN NACL 20-0.9 MG/50ML-% IV SOLN
INTRAVENOUS | Status: AC
  Filled 2020-04-30: qty 50

## 2020-04-30 MED ORDER — ACETAMINOPHEN 325 MG PO TABS
650.0000 mg | ORAL_TABLET | Freq: Once | ORAL | Status: AC
  Administered 2020-04-30: 650 mg via ORAL

## 2020-04-30 MED ORDER — FAMOTIDINE IN NACL 20-0.9 MG/50ML-% IV SOLN
20.0000 mg | Freq: Once | INTRAVENOUS | Status: AC
  Administered 2020-04-30: 20 mg via INTRAVENOUS

## 2020-04-30 MED ORDER — SODIUM CHLORIDE 0.9% FLUSH
10.0000 mL | INTRAVENOUS | Status: DC | PRN
  Administered 2020-04-30: 10 mL
  Filled 2020-04-30: qty 10

## 2020-04-30 MED ORDER — SODIUM CHLORIDE 0.9% FLUSH
10.0000 mL | Freq: Once | INTRAVENOUS | Status: AC
  Administered 2020-04-30: 10 mL
  Filled 2020-04-30: qty 10

## 2020-04-30 MED ORDER — ACETAMINOPHEN 325 MG PO TABS
ORAL_TABLET | ORAL | Status: AC
  Filled 2020-04-30: qty 2

## 2020-04-30 MED ORDER — DIPHENHYDRAMINE HCL 25 MG PO CAPS
50.0000 mg | ORAL_CAPSULE | Freq: Once | ORAL | Status: AC
  Administered 2020-04-30: 50 mg via ORAL

## 2020-04-30 NOTE — Patient Instructions (Addendum)
[place] Discharge Instructions for Patients Receiving Chemotherapy  Today you received the following chemotherapy agents: Rituximab (RITUXAN).  To help prevent nausea and vomiting after your treatment, we encourage you to take your nausea medication as directed.    If you develop nausea and vomiting that is not controlled by your nausea medication, call the clinic.   BELOW ARE SYMPTOMS THAT SHOULD BE REPORTED IMMEDIATELY:  *FEVER GREATER THAN 100.5 F  *CHILLS WITH OR WITHOUT FEVER  NAUSEA AND VOMITING THAT IS NOT CONTROLLED WITH YOUR NAUSEA MEDICATION  *UNUSUAL SHORTNESS OF BREATH  *UNUSUAL BRUISING OR BLEEDING  TENDERNESS IN MOUTH AND THROAT WITH OR WITHOUT PRESENCE OF ULCERS  *URINARY PROBLEMS  *BOWEL PROBLEMS  UNUSUAL RASH Items with * indicate a potential emergency and should be followed up as soon as possible.  Feel free to call the clinic should you have any questions or concerns. The clinic phone number is [phone].  Please show the [place] at check-in to the Emergency Department and triage nurse.

## 2020-04-30 NOTE — Progress Notes (Signed)
[place] OFFICE PROGRESS NOTE   Diagnosis: Non-Hodgkin's lymphoma  INTERVAL HISTORY:   Tracy Mclaughlin returns as scheduled.  She completed another cycle of Revlimid/rituximab beginning 04/02/2020.  No nausea, diarrhea, or neuropathy symptoms.  Stable energy level and appetite.  No fever or night sweats.  The lymph node in the left neck has increased in size.  Objective:  Vital signs in last 24 hours:  Blood pressure (!) 129/53, pulse 76, temperature 97.8 F (36.6 C), temperature source Temporal, resp. rate 17, height 5\' 7"  (1.702 m), weight 178 lb 1.6 oz (80.8 kg), SpO2 99 %.    Lymphatics: 4-5 cm nodal mass filling the left supraclavicular fossa/scalene area, 1 cm right sided neck nodes, 2-4 cm bilateral axillary nodes Resp: Lungs clear bilaterally Cardio: Regular rate and rhythm GI: No hepatosplenomegaly, nontender, fullness in the upper mid abdomen to the right of midline Vascular: No leg edema  Portacath/PICC-without erythema  Lab Results:  Lab Results  Component Value Date   WBC 7.0 04/30/2020   HGB 13.1 04/30/2020   HCT 39.1 04/30/2020   MCV 103.2 (H) 04/30/2020   PLT 162 04/30/2020   NEUTROABS 2.2 04/30/2020    CMP  Lab Results  Component Value Date   NA 141 04/30/2020   K 4.2 04/30/2020   CL 107 04/30/2020   CO2 22 04/30/2020   GLUCOSE 99 04/30/2020   BUN 13 04/30/2020   CREATININE 1.09 (H) 04/30/2020   CALCIUM 8.6 (L) 04/30/2020   PROT 6.3 (L) 04/30/2020   ALBUMIN 3.5 04/30/2020   AST 29 04/30/2020   ALT 10 04/30/2020   ALKPHOS 108 04/30/2020   BILITOT 0.9 04/30/2020   GFRNONAA 47 (L) 04/30/2020   GFRAA 54 (L) 04/30/2020    Lab Results  Component Value Date   CEA1 3.16 07/22/2016   CEA1 2.7 07/22/2016     Medications: I have reviewed the patient's current medications.   Assessment/Plan:  1. 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 Status post weekly Rituxan x4 03/01/2012 through 03/22/2012. She completed 4 "maintenance" doses of Rituxan, last on 12/19/2012. A restaging CT on 02/09/2013 showed no evidence of lymphoma.   Lymph node lateral to the thyroid bed on a neck ultrasound 02/21/2014, status post an FNA biopsy concerning for a lymphoproliferative disorder.  PET scan 09/28/2016 with active lymphoma within the neck, chest, abdomen, pelvis; splenic enlargement and hypermetabolism suspicious for splenic involvement.  Initiation of Rituxan weekly 4 09/29/2016  Initiation of maintenance Rituxan on a 3 month schedule 12/23/2016; final Rituxan 08/31/2018  Thyroid ultrasound 02/07/2019-left cervical lymphadenopathy  PET scan 03/08/2019-extensive recurrent hypermetabolic lymphoma involving the neck, chest, abdomen and pelvis.  03/16/2019 left cervical lymph node biopsy-features consistent with previously diagnosed non-Hodgkin's B-cell lymphoma, phenotypically consistent with marginal zone lymphoma. Flow cytometry with lambda restricted B-cell population without expression of CD5 or CD10 comprising 87% of all lymphocytes.  Cycle 1 bendamustine/Rituxan 03/22/2019  Excision deep left axillary lymph nodes 05/04/2019-non-Hodgkin's B-cell lymphoma with differential including a marginal zone lymphoma and atypical small lymphocytic lymphoma. Flow cytometry with monoclonal B-cell population without expression of CD5 or CD10, comprises 96% of all lymphocytes.  Cycle 1 CHOP/Rituxan 05/18/2019  Cycle 2 CHOP/Rituxan 06/08/2019  CTs 06/15/2019-partial improvement in diffuse adenopathy, stable mild splenomegaly  Cycle 1 Revlimid/rituximab 07/19/2019 (Revlimid start 07/20/2019)  Cycle 2 Revlimid/rituximab 08/16/2019 (Revlimid placed on hold 08/30/2019 due to neutropenia)  Cycle 3 of Revlimid/rituximab 09/13/2019 (Revlimid schedule changed to 14  days on/14 days off)  Cycle 4 Revlimid/rituximab 10/10/2019  Cycle 5  Revlimid/rituximab 11/07/2019  Cycle 6 Revlimid/rituximab 12/07/2019  CTs 12/28/2019-diffuse lymphadenopathy-slightly increased compared to 06/15/2019  Cycle 7 Revlimid/rituximab 01/04/2020  Cycle 8 Revlimid/Rituxan 02/01/2020  Cycle 9 Revlimid/Rituxan 03/05/2020  Cycle 10 Revlimid/Rituxan 04/02/2020  Cycle 11 Revlimid/Rituxan 04/30/2020 2. Stage IV (T1bN1b M0) papillary thyroid cancer, status post a thyroidectomy with reimplantation of the left superior parathyroid gland on 05/23/2012, status post radioactive iodine therapy, followed by Dr. Buddy Duty.  3. Stage II (T3 N0) colon cancer, status post a right colectomy 10/19/2011, last colonoscopy April 2015-sigmoid adenoma removed.  4. History of a pulmonary embolism December 2012.  5. History of Atrial fibrillation 6. Iron deficiency anemia-new 03/18/2014. Hemoccult positive stool. The anemia corrected with iron. No longer taking iron.  Status post an upper endoscopy and colonoscopy by Dr. Carlean Purl April 2015 with no bleeding source identified, benign adenoma removed from the sigmoid colon. 7. Report of an upper gastric intestinal bleed fall 2016-managed in [place]. [name] 8. Left knee replacement May 2017, repeat left knee surgery May 2018 9. Pruritic rash 07/22/2016 10. Nausea and diarrhea 04/02/2019-stool negative for C. difficile toxin 11. 06/18/2019 [facility] admission for symptomatic anemia.  Disposition: Tracy Mclaughlin continues to tolerate the Revlimid and rituximab well.  Her overall status appears unchanged.  There is increased fullness in the left supraclavicular fossa, other palpable lymph nodes appear unchanged.  She will complete another cycle of Revlimid/rituximab beginning today.  Tracy Mclaughlin will undergo a restaging CT evaluation prior to an office visit next month.  We will consider changing treatment to the ibrutinib if there is significant progression on the CTs.  Betsy Coder, MD  04/30/2020  11:09 AM

## 2020-05-01 ENCOUNTER — Telehealth: Payer: Self-pay | Admitting: Oncology

## 2020-05-01 NOTE — Telephone Encounter (Signed)
Scheduled per los. Called, not able to leave msg. Mailed printout  

## 2020-05-20 DEATH — deceased

## 2020-05-21 ENCOUNTER — Other Ambulatory Visit: Payer: Self-pay | Admitting: *Deleted

## 2020-05-21 DIAGNOSIS — C8510 Unspecified B-cell lymphoma, unspecified site: Secondary | ICD-10-CM

## 2020-05-21 MED ORDER — LENALIDOMIDE 10 MG PO CAPS: 10.0000 mg | ORAL_CAPSULE | Freq: Every day | ORAL | 0 refills | Status: DC

## 2020-05-22 ENCOUNTER — Ambulatory Visit (HOSPITAL_COMMUNITY)
Admission: RE | Admit: 2020-05-22 | Discharge: 2020-05-22 | Disposition: A | Discharge status: Home / Self Care | Payer: Medicare PPO | Source: Ambulatory Visit | Attending: Oncology | Admitting: Oncology

## 2020-05-22 ENCOUNTER — Other Ambulatory Visit: Payer: Self-pay

## 2020-05-22 DIAGNOSIS — C8510 Unspecified B-cell lymphoma, unspecified site: Secondary | ICD-10-CM | POA: Diagnosis not present

## 2020-05-22 MED ORDER — HEPARIN SOD (PORK) LOCK FLUSH 100 UNIT/ML IV SOLN
INTRAVENOUS | Status: AC
  Filled 2020-05-22: qty 5

## 2020-05-22 MED ORDER — IOHEXOL 300 MG/ML  SOLN
100.0000 mL | Freq: Once | INTRAMUSCULAR | Status: AC | PRN
  Administered 2020-05-22: 100 mL via INTRAVENOUS

## 2020-05-22 MED ORDER — HEPARIN SOD (PORK) LOCK FLUSH 100 UNIT/ML IV SOLN
500.0000 [IU] | Freq: Once | INTRAVENOUS | Status: AC
  Administered 2020-05-22: 500 [IU] via INTRAVENOUS

## 2020-05-22 MED ORDER — SODIUM CHLORIDE (PF) 0.9 % IJ SOLN
INTRAMUSCULAR | Status: AC
  Filled 2020-05-22: qty 50

## 2020-05-22 NOTE — Progress Notes (Signed)
Pharmacist Chemotherapy Monitoring - Follow Up Assessment    I verify that I have reviewed each item in the below checklist:  . Regimen for the patient is scheduled for the appropriate day and plan matches scheduled date. Marland Kitchen Appropriate non-routine labs are ordered dependent on drug ordered. . If applicable, additional medications reviewed and ordered per protocol based on lifetime cumulative doses and/or treatment regimen.   Plan for follow-up and/or issues identified: No . I-vent associated with next due treatment: No . MD and/or nursing notified: No  Tracy Mclaughlin 05/22/2020 11:58 AM

## 2020-05-23 ENCOUNTER — Telehealth: Payer: Self-pay | Admitting: *Deleted

## 2020-05-23 NOTE — Telephone Encounter (Signed)
Notified husband of CT results and to keep f/u visit to review and discuss change in treatment. Confirmed appointment date/time with him.

## 2020-05-28 ENCOUNTER — Inpatient Hospital Stay: Payer: Medicare PPO

## 2020-05-28 ENCOUNTER — Inpatient Hospital Stay: Payer: Medicare PPO | Attending: Oncology | Admitting: Oncology

## 2020-05-28 ENCOUNTER — Other Ambulatory Visit: Payer: Self-pay

## 2020-05-28 VITALS — BP 113/76 | HR 76 | Temp 98.6°F | Resp 18 | Ht 67.0 in | Wt 175.6 lb

## 2020-05-28 DIAGNOSIS — C8267 Cutaneous follicle center lymphoma, spleen: Secondary | ICD-10-CM | POA: Diagnosis present

## 2020-05-28 DIAGNOSIS — C73 Malignant neoplasm of thyroid gland: Secondary | ICD-10-CM

## 2020-05-28 DIAGNOSIS — Z86711 Personal history of pulmonary embolism: Secondary | ICD-10-CM | POA: Insufficient documentation

## 2020-05-28 DIAGNOSIS — R110 Nausea: Secondary | ICD-10-CM | POA: Insufficient documentation

## 2020-05-28 DIAGNOSIS — C8510 Unspecified B-cell lymphoma, unspecified site: Secondary | ICD-10-CM | POA: Diagnosis not present

## 2020-05-28 DIAGNOSIS — Z85850 Personal history of malignant neoplasm of thyroid: Secondary | ICD-10-CM | POA: Diagnosis not present

## 2020-05-28 DIAGNOSIS — R61 Generalized hyperhidrosis: Secondary | ICD-10-CM | POA: Insufficient documentation

## 2020-05-28 DIAGNOSIS — Z96652 Presence of left artificial knee joint: Secondary | ICD-10-CM | POA: Insufficient documentation

## 2020-05-28 DIAGNOSIS — Z85038 Personal history of other malignant neoplasm of large intestine: Secondary | ICD-10-CM | POA: Insufficient documentation

## 2020-05-28 DIAGNOSIS — I4891 Unspecified atrial fibrillation: Secondary | ICD-10-CM | POA: Insufficient documentation

## 2020-05-28 DIAGNOSIS — G620 Drug-induced polyneuropathy: Secondary | ICD-10-CM | POA: Insufficient documentation

## 2020-05-28 DIAGNOSIS — C8261 Cutaneous follicle center lymphoma, lymph nodes of head, face, and neck: Secondary | ICD-10-CM | POA: Diagnosis not present

## 2020-05-28 DIAGNOSIS — Z95828 Presence of other vascular implants and grafts: Secondary | ICD-10-CM

## 2020-05-28 LAB — CBC WITH DIFFERENTIAL (CANCER CENTER ONLY)
Abs Immature Granulocytes: 0.04 10*3/uL (ref 0.00–0.07)
Basophils Absolute: 0.1 10*3/uL (ref 0.0–0.1)
Basophils Relative: 2 %
Eosinophils Absolute: 0.3 10*3/uL (ref 0.0–0.5)
Eosinophils Relative: 6 %
HCT: 39.9 % (ref 36.0–46.0)
Hemoglobin: 13.6 g/dL (ref 12.0–15.0)
Immature Granulocytes: 1 %
Lymphocytes Relative: 54 %
Lymphs Abs: 3.1 10*3/uL (ref 0.7–4.0)
MCH: 34.3 pg — ABNORMAL HIGH (ref 26.0–34.0)
MCHC: 34.1 g/dL (ref 30.0–36.0)
MCV: 100.5 fL — ABNORMAL HIGH (ref 80.0–100.0)
Monocytes Absolute: 0.4 10*3/uL (ref 0.1–1.0)
Monocytes Relative: 6 %
Neutro Abs: 1.8 10*3/uL (ref 1.7–7.7)
Neutrophils Relative %: 31 %
Platelet Count: 142 10*3/uL — ABNORMAL LOW (ref 150–400)
RBC: 3.97 MIL/uL (ref 3.87–5.11)
RDW: 14.6 % (ref 11.5–15.5)
WBC Count: 5.9 10*3/uL (ref 4.0–10.5)
nRBC: 0 % (ref 0.0–0.2)

## 2020-05-28 LAB — BASIC METABOLIC PANEL - CANCER CENTER ONLY
Anion gap: 13 (ref 5–15)
BUN: 13 mg/dL (ref 8–23)
CO2: 22 mmol/L (ref 22–32)
Calcium: 9 mg/dL (ref 8.9–10.3)
Chloride: 108 mmol/L (ref 98–111)
Creatinine: 1.12 mg/dL — ABNORMAL HIGH (ref 0.44–1.00)
GFR, Est AFR Am: 53 mL/min — ABNORMAL LOW (ref >60)
GFR, Estimated: 45 mL/min — ABNORMAL LOW (ref >60)
Glucose, Bld: 95 mg/dL (ref 70–99)
Potassium: 4.5 mmol/L (ref 3.5–5.1)
Sodium: 143 mmol/L (ref 135–145)

## 2020-05-28 LAB — LACTATE DEHYDROGENASE: LDH: 456 U/L — ABNORMAL HIGH (ref 98–192)

## 2020-05-28 MED ORDER — SODIUM CHLORIDE 0.9% FLUSH
10.0000 mL | Freq: Once | INTRAVENOUS | Status: AC
  Administered 2020-05-28: 10 mL
  Filled 2020-05-28: qty 10

## 2020-05-28 MED ORDER — SODIUM CHLORIDE 0.9% FLUSH
10.0000 mL | INTRAVENOUS | Status: DC | PRN
  Administered 2020-05-28: 10 mL via INTRAVENOUS
  Filled 2020-05-28: qty 10

## 2020-05-28 MED ORDER — HEPARIN SOD (PORK) LOCK FLUSH 100 UNIT/ML IV SOLN
500.0000 [IU] | Freq: Once | INTRAVENOUS | Status: AC
  Administered 2020-05-28: 500 [IU] via INTRAVENOUS
  Filled 2020-05-28: qty 5

## 2020-05-28 NOTE — Progress Notes (Signed)
[place] OFFICE PROGRESS NOTE   Diagnosis: Non-Hodgkin's lymphoma  INTERVAL HISTORY:   Tracy Mclaughlin returns for a scheduled visit.  No new complaint.  The lymph nodes in the left neck are larger.  Her energy level and appetite are unchanged.  She completed another treatment with rituximab on 04/30/2020.  Objective:  Vital signs in last 24 hours:  Blood pressure 113/76, pulse 76, temperature 98.6 F (37 C), temperature source Oral, resp. rate 18, height 5\' 7"  (1.702 m), weight 175 lb 9.6 oz (79.7 kg), SpO2 98 %.   Lymphatics: 2-3 cm bilateral axillary and left low cervical/supraclavicular nodes Resp: Lungs clear bilaterally Cardio: Regular rate and rhythm GI: No hepatosplenomegaly Vascular: No leg edema    Portacath/PICC-without erythema  Lab Results:  Lab Results  Component Value Date   WBC 5.9 05/28/2020   HGB 13.6 05/28/2020   HCT 39.9 05/28/2020   MCV 100.5 (H) 05/28/2020   PLT 142 (L) 05/28/2020   NEUTROABS 1.8 05/28/2020    CMP  Lab Results  Component Value Date   NA 143 05/28/2020   K 4.5 05/28/2020   CL 108 05/28/2020   CO2 22 05/28/2020   GLUCOSE 95 05/28/2020   BUN 13 05/28/2020   CREATININE 1.12 (H) 05/28/2020   CALCIUM 9.0 05/28/2020   PROT 6.3 (L) 04/30/2020   ALBUMIN 3.5 04/30/2020   AST 29 04/30/2020   ALT 10 04/30/2020   ALKPHOS 108 04/30/2020   BILITOT 0.9 04/30/2020   GFRNONAA 45 (L) 05/28/2020   GFRAA 53 (L) 05/28/2020    Lab Results  Component Value Date   CEA1 3.16 07/22/2016   CEA1 2.7 07/22/2016     Medications: I have reviewed the patient's current medications.   Assessment/Plan: 1. 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 Status post weekly Rituxan x4 03/01/2012 through 03/22/2012. She completed 4 "maintenance" doses of Rituxan, last on 12/19/2012. A restaging CT on 02/09/2013 showed no evidence of  lymphoma.   Lymph node lateral to the thyroid bed on a neck ultrasound 02/21/2014, status post an FNA biopsy concerning for a lymphoproliferative disorder.  PET scan 09/28/2016 with active lymphoma within the neck, chest, abdomen, pelvis; splenic enlargement and hypermetabolism suspicious for splenic involvement.  Initiation of Rituxan weekly 4 09/29/2016  Initiation of maintenance Rituxan on a 3 month schedule 12/23/2016; final Rituxan 08/31/2018  Thyroid ultrasound 02/07/2019-left cervical lymphadenopathy  PET scan 03/08/2019-extensive recurrent hypermetabolic lymphoma involving the neck, chest, abdomen and pelvis.  03/16/2019 left cervical lymph node biopsy-features consistent with previously diagnosed non-Hodgkin's B-cell lymphoma, phenotypically consistent with marginal zone lymphoma. Flow cytometry with lambda restricted B-cell population without expression of CD5 or CD10 comprising 87% of all lymphocytes.  Cycle 1 bendamustine/Rituxan 03/22/2019  Excision deep left axillary lymph nodes 05/04/2019-non-Hodgkin's B-cell lymphoma with differential including a marginal zone lymphoma and atypical small lymphocytic lymphoma. Flow cytometry with monoclonal B-cell population without expression of CD5 or CD10, comprises 96% of all lymphocytes.  Cycle 1 CHOP/Rituxan 05/18/2019  Cycle 2 CHOP/Rituxan 06/08/2019  CTs 06/15/2019-partial improvement in diffuse adenopathy, stable mild splenomegaly  Cycle 1 Revlimid/rituximab 07/19/2019 (Revlimid start 07/20/2019)  Cycle 2 Revlimid/rituximab 08/16/2019 (Revlimid placed on hold 08/30/2019 due to neutropenia)  Cycle 3 of Revlimid/rituximab 09/13/2019 (Revlimid schedule changed to 14 days on/14 days off)  Cycle 4 Revlimid/rituximab 10/10/2019  Cycle 5 Revlimid/rituximab 11/07/2019  Cycle 6 Revlimid/rituximab 12/07/2019  CTs 12/28/2019-diffuse lymphadenopathy-slightly increased compared to 06/15/2019  Cycle 7 Revlimid/rituximab 01/04/2020  Cycle 8  Revlimid/Rituxan 02/01/2020  Cycle 9 Revlimid/Rituxan 03/05/2020  Cycle 10 Revlimid/Rituxan 04/02/2020  Cycle 11 Revlimid/Rituxan 04/30/2020  CT 05/22/2020-mild increase in left supraclavicular adenopathy, mild increase in chest, retroperitoneal, and pelvic adenopathy.  Stable mild splenomegaly. 2. Stage IV (T1bN1b M0) papillary thyroid cancer, status post a thyroidectomy with reimplantation of the left superior parathyroid gland on 05/23/2012, status post radioactive iodine therapy, followed by Dr. Buddy Duty.  3. Stage II (T3 N0) colon cancer, status post a right colectomy 10/19/2011, last colonoscopy April 2015-sigmoid adenoma removed.  4. History of a pulmonary embolism December 2012.  5. History of Atrial fibrillation 6. Iron deficiency anemia-new 03/18/2014. Hemoccult positive stool. The anemia corrected with iron. No longer taking iron.  Status post an upper endoscopy and colonoscopy by Dr. Carlean Purl April 2015 with no bleeding source identified, benign adenoma removed from the sigmoid colon. 7. Report of an upper gastric intestinal bleed fall 2016-managed in [place]. [name] 8. Left knee replacement May 2017, repeat left knee surgery May 2018 9. Pruritic rash 07/22/2016 10. Nausea and diarrhea 04/02/2019-stool negative for C. difficile toxin 11. 06/18/2019 [facility] admission for symptomatic anemia.    Disposition: Tracy Mclaughlin appears stable.  She has been maintained on Revlimid/rituximab since July 2020.  The palpable lymph nodes have increased in size and restaging CTs confirm mild disease progression.  I discussed treatment options with Tracy Mclaughlin.  I recommend discontinuing Revlimid/rituximab.  She will take a treatment break and return for further discussion in 3-4 weeks.  We will likely recommend changing to ibrutinib or at acalabrutinib.  We reviewed potential toxicities associated with these agents including the chance of a rash, diarrhea, bruising, hematologic toxicity, arthralgias, and  arrhythmias.  She reports a history of intermittent atrial fibrillation in the past.  I reviewed the CT images.  Betsy Coder, MD  05/28/2020  11:00 AM

## 2020-05-29 ENCOUNTER — Telehealth: Payer: Self-pay

## 2020-05-29 NOTE — Telephone Encounter (Signed)
Spoke with Pharmacy and cancelled Revlimid per MD Benay Spice

## 2020-06-02 ENCOUNTER — Telehealth: Payer: Self-pay | Admitting: Oncology

## 2020-06-02 NOTE — Telephone Encounter (Signed)
Scheduled per los. Called and spoke with patients husband. Confirmed appt  °

## 2020-06-09 ENCOUNTER — Telehealth: Payer: Self-pay | Admitting: *Deleted

## 2020-06-09 NOTE — Telephone Encounter (Addendum)
Reports lymph nodes left neck are larger and now has enlarged nodes right neck area. Feeling more fatigued "all I want to do is stay in bed all day". No fever or sweats. Some periods of nausea when she looks at food. Not eating much, but drinking Boost and drinking fluids. Asking if there is anything to make her feel better besides getting back on treatment. Per Dr. Benay Spice: Needs to get back on treatment. Can see her this week to discuss next step. See NP on 6/23 at 11:15 Called patient back and she agrees to come on Friday.

## 2020-06-13 ENCOUNTER — Inpatient Hospital Stay (HOSPITAL_BASED_OUTPATIENT_CLINIC_OR_DEPARTMENT_OTHER): Payer: Medicare PPO | Admitting: Nurse Practitioner

## 2020-06-13 ENCOUNTER — Encounter: Payer: Self-pay | Admitting: Nurse Practitioner

## 2020-06-13 ENCOUNTER — Other Ambulatory Visit: Payer: Self-pay

## 2020-06-13 VITALS — BP 112/49 | HR 91 | Temp 98.1°F | Resp 18 | Ht 67.0 in | Wt 177.9 lb

## 2020-06-13 DIAGNOSIS — C8510 Unspecified B-cell lymphoma, unspecified site: Secondary | ICD-10-CM | POA: Diagnosis not present

## 2020-06-13 DIAGNOSIS — C8261 Cutaneous follicle center lymphoma, lymph nodes of head, face, and neck: Secondary | ICD-10-CM | POA: Diagnosis not present

## 2020-06-13 NOTE — Progress Notes (Addendum)
[place] [facility] OFFICE PROGRESS NOTE   Diagnosis: Non-Hodgkin's lymphoma  INTERVAL HISTORY:   Tracy Mclaughlin returns prior to scheduled follow-up due to progressive adenopathy and increased fatigue.  She reports a progressive decline in energy and appetite over the past 2 to 3 weeks.  She is having sweats day and night around her neck/hairline.  No fever.  Neck lymph nodes are larger.  She complains of intermittent nausea over the past week.  No vomiting.  Objective:  Vital signs in last 24 hours:  Blood pressure (!) 112/49, pulse 91, temperature 98.1 F (36.7 C), temperature source Tympanic, resp. rate 18, height 5\' 7"  (1.702 m), weight 177 lb 14.4 oz (80.7 kg), SpO2 97 %.    Lymphatics: Small anterior and posterior cervical lymph nodes.  1 to 2 cm left supraclavicular lymph node.  Bilateral axillary lymph nodes, largest left axilla approximately 2 cm. Resp: Lungs clear bilaterally. Cardio: Regular rate and rhythm. GI: Abdomen soft and nontender.  No hepatosplenomegaly. Vascular: No leg edema. Port-A-Cath without erythema.  Lab Results:  Lab Results  Component Value Date   WBC 5.9 05/28/2020   HGB 13.6 05/28/2020   HCT 39.9 05/28/2020   MCV 100.5 (H) 05/28/2020   PLT 142 (L) 05/28/2020   NEUTROABS 1.8 05/28/2020    Imaging:  No results found.  Medications: I have reviewed the patient's current medications.  Assessment/Plan: 1. 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 Status post weekly Rituxan x4 03/01/2012 through 03/22/2012. She completed 4 "maintenance" doses of Rituxan, last on 12/19/2012. A restaging CT on 02/09/2013 showed no evidence of lymphoma.   Lymph node lateral to the thyroid bed on a neck ultrasound 02/21/2014, status post an FNA biopsy concerning for a lymphoproliferative disorder.  PET scan 09/28/2016 with active lymphoma within the neck,  chest, abdomen, pelvis; splenic enlargement and hypermetabolism suspicious for splenic involvement.  Initiation of Rituxan weekly 4 09/29/2016  Initiation of maintenance Rituxan on a 3 month schedule 12/23/2016; final Rituxan 08/31/2018  Thyroid ultrasound 02/07/2019-left cervical lymphadenopathy  PET scan 03/08/2019-extensive recurrent hypermetabolic lymphoma involving the neck, chest, abdomen and pelvis.  03/16/2019 left cervical lymph node biopsy-features consistent with previously diagnosed non-Hodgkin's B-cell lymphoma, phenotypically consistent with marginal zone lymphoma. Flow cytometry with lambda restricted B-cell population without expression of CD5 or CD10 comprising 87% of all lymphocytes.  Cycle 1 bendamustine/Rituxan 03/22/2019  Excision deep left axillary lymph nodes 05/04/2019-non-Hodgkin's B-cell lymphoma with differential including a marginal zone lymphoma and atypical small lymphocytic lymphoma. Flow cytometry with monoclonal B-cell population without expression of CD5 or CD10, comprises 96% of all lymphocytes.  Cycle 1 CHOP/Rituxan 05/18/2019  Cycle 2 CHOP/Rituxan 06/08/2019  CTs 06/15/2019-partial improvement in diffuse adenopathy, stable mild splenomegaly  Cycle 1 Revlimid/rituximab 07/19/2019 (Revlimid start 07/20/2019)  Cycle 2 Revlimid/rituximab 08/16/2019 (Revlimid placed on hold 08/30/2019 due to neutropenia)  Cycle 3 of Revlimid/rituximab 09/13/2019 (Revlimid schedule changed to 14 days on/14 days off)  Cycle 4 Revlimid/rituximab 10/10/2019  Cycle 5 Revlimid/rituximab 11/07/2019  Cycle 6 Revlimid/rituximab 12/07/2019  CTs 12/28/2019-diffuse lymphadenopathy-slightly increased compared to 06/15/2019  Cycle 7 Revlimid/rituximab 01/04/2020  Cycle 8 Revlimid/Rituxan 02/01/2020  Cycle 9 Revlimid/Rituxan 03/05/2020  Cycle 10 Revlimid/Rituxan 04/02/2020  Cycle 11 Revlimid/Rituxan 04/30/2020  CT 05/22/2020-mild increase in left supraclavicular adenopathy, mild increase  in chest, retroperitoneal, and pelvic adenopathy.  Stable mild splenomegaly. 2. Stage IV (T1bN1b M0) papillary thyroid cancer, status post a thyroidectomy with reimplantation of the left superior parathyroid gland  on 05/23/2012, status post radioactive iodine therapy, followed by Dr. Buddy Duty.  3. Stage II (T3 N0) colon cancer, status post a right colectomy 10/19/2011, last colonoscopy April 2015-sigmoid adenoma removed.  4. History of a pulmonary embolism December 2012.  5. History of Atrial fibrillation 6. Iron deficiency anemia-new 03/18/2014. Hemoccult positive stool. The anemia corrected with iron. No longer taking iron.  Status post an upper endoscopy and colonoscopy by Dr. Carlean Purl April 2015 with no bleeding source identified, benign adenoma removed from the sigmoid colon. 7. Report of an upper gastric intestinal bleed fall 2016-managed in [place]. [name] 8. Left knee replacement May 2017, repeat left knee surgery May 2018 9. Pruritic rash 07/22/2016 10. Nausea and diarrhea 04/02/2019-stool negative for C. difficile toxin 11. 06/18/2019 [facility] admission for symptomatic anemia.    Disposition: Tracy Mclaughlin has progressive non-Hodgkin's lymphoma.  She is experiencing fatigue/malaise, anorexia, sweats and has increased peripheral adenopathy on exam.  Dr. Benay Spice recommends beginning treatment with acalabrutinib or ibrutinib depending on insurance approval.  We reviewed potential toxicities including bone marrow toxicity, rash, diarrhea, arthralgias, arrhythmias, bruising.  She agrees to proceed.  Anticipate she will begin  next week.  She will return for lab and follow-up on 06/30/2020.  Patient seen with Dr. Benay Spice.    Ned Card ANP/GNP-BC   06/13/2020  11:34 AM  This was a shared visit with Ned Card.  Tracy Mclaughlin has symptomatic progression of the low-grade lymphoma.  We decided to begin treatment with a BTK inhibitor since she did not have a significant response to chemotherapy last  year.  She has a history of intermittent atrial fibrillation.  I recommend treatment with acalabrutinib or ibrutinib pending insurance approval.  We reviewed potential toxicities associated with these agents including the chance of bleeding.  She agrees to proceed.  Julieanne Manson, MD

## 2020-06-16 ENCOUNTER — Telehealth: Payer: Self-pay | Admitting: Nurse Practitioner

## 2020-06-16 ENCOUNTER — Telehealth: Payer: Self-pay | Admitting: *Deleted

## 2020-06-16 ENCOUNTER — Telehealth: Payer: Self-pay | Admitting: Pharmacist

## 2020-06-16 ENCOUNTER — Other Ambulatory Visit: Payer: Self-pay | Admitting: Oncology

## 2020-06-16 DIAGNOSIS — C8590 Non-Hodgkin lymphoma, unspecified, unspecified site: Secondary | ICD-10-CM

## 2020-06-16 MED ORDER — ACALABRUTINIB 100 MG PO CAPS: 100.0000 mg | ORAL_CAPSULE | Freq: Two times a day (BID) | ORAL | 0 refills | Status: DC

## 2020-06-16 NOTE — Telephone Encounter (Signed)
No 6/25 los, no changes made to patient schedule

## 2020-06-16 NOTE — Telephone Encounter (Signed)
Oral Oncology Pharmacist Encounter  Received new prescription for Calquence (acalabrutinib) for the treatment of progressive NHL-low grade, planned duration until disease progression or unacceptable drug toxicity.  Prescription dose and frequency assessed.   Current medication list in Epic reviewed, a few DDIs with acalabrutinib identified: -Diltiazem: Confirmed with patient that she is currently taking the diltiazem. Diltiazem may increase the concentration of acalabrutinib. The recommendation is to dose reduce the acalabrutinib to 100mg  daily for concurrent use with diltiazem. -Aspirin and citalopram: acalabrutinib may enhance the antiplatelet effect of medications with antiplatelet properties like aspirin and citalopram. Monitor patient for s/sx of bleeding. No baseline dose adjustments needed.  Prescription has been e-scribed to the [facility] for benefits analysis and approval.  Oral Oncology Clinic will continue to follow for insurance authorization, copayment issues, initial counseling and start date.  Darl Pikes, PharmD, BCPS, BCOP, CPP Hematology/Oncology Clinical Pharmacist Practitioner [facility] [phone]  06/16/2020 4:35 PM

## 2020-06-16 NOTE — Telephone Encounter (Signed)
Called to inquire as to status of her new treatment drug and when it will be sent to pharmacy? Per Dr. Benay Spice, working on acalabrutinb or ibrutinib. Oral chemo pharmacist is working on Teacher, adult education. Requested they contact patient with update of status.

## 2020-06-17 ENCOUNTER — Telehealth: Payer: Self-pay | Admitting: Pharmacist

## 2020-06-17 MED ORDER — ACALABRUTINIB 100 MG PO CAPS: 100.0000 mg | ORAL_CAPSULE | Freq: Every day | ORAL | 0 refills | Status: DC

## 2020-06-17 NOTE — Telephone Encounter (Signed)
Oral Chemotherapy Pharmacist Encounter   Spoke with Dr. Benay Spice about diltiazem drug-drug interaction with acalabrutinib. Will dose reduce patient to 100mg  daily. New Rx sent to [facility].  Darl Pikes, PharmD, BCPS, BCOP, CPP Hematology/Oncology Clinical Pharmacist [facility] [phone]  06/17/2020 10:38 AM

## 2020-06-17 NOTE — Telephone Encounter (Signed)
Oral Oncology Pharmacist Encounter   Received notification from [facility] that prior authorization for [place] is required.   PA submitted on [place] [place] [id number] Status is pending   Oral Oncology Clinic will continue to follow.   Darl Pikes, PharmD, BCPS, [facility] Hematology/Oncology Clinical Pharmacist [facility] Oral [facility] [phone]  06/17/2020 9:52 AM

## 2020-06-18 MED FILL — CALQUENCE 100 MG CAPSULE: 100 | 30 days supply | Qty: 30 | Fill #0

## 2020-06-18 NOTE — Telephone Encounter (Signed)
Oral Chemotherapy Pharmacist Encounter  [facility] will fill the Calquence using a one month supply voucher which the denial from the insurance is appealed. Medication will be delivered on 06/19/20. Patient knows to et start when she has the medication in hand.  Patient Education I spoke with patient for overview of new oral chemotherapy medication: Calquence (acalabrutinib) for the treatment of progressive NHL-low grade, planned duration until disease progression or unacceptable drug toxicity.   Pt is doing well. Counseled patient on administration, dosing, side effects, monitoring, drug-food interactions, safe handling, storage, and disposal. Patient will take 1 capsule (100 mg total) by mouth daily.  Side effects include but not limited to: HA, diarrhea, decreased wbc.    Reviewed with patient importance of keeping a medication schedule and plan for any missed doses.  Ms. Bungert voiced understanding and appreciation. All questions answered. Medication handout placed in the mail.  Provided patient with Oral [facility] phone number. Patient knows to call the office with questions or concerns. Oral Chemotherapy Navigation Clinic will continue to follow.  Darl Pikes, PharmD, BCPS, BCOP, CPP Hematology/Oncology Clinical Pharmacist Practitioner [facility] [phone]  06/18/2020 4:57 PM

## 2020-06-23 ENCOUNTER — Inpatient Hospital Stay (HOSPITAL_COMMUNITY)
Admission: EM | Admit: 2020-06-23 | Discharge: 2020-07-25 | DRG: 689 | Disposition: A | Discharge status: Skilled Nursing Facility | Payer: Medicare PPO | Attending: Internal Medicine | Admitting: Internal Medicine

## 2020-06-23 ENCOUNTER — Emergency Department (HOSPITAL_COMMUNITY): Payer: Medicare PPO

## 2020-06-23 ENCOUNTER — Encounter (HOSPITAL_COMMUNITY): Payer: Self-pay | Admitting: Emergency Medicine

## 2020-06-23 ENCOUNTER — Other Ambulatory Visit: Payer: Self-pay

## 2020-06-23 DIAGNOSIS — Z85850 Personal history of malignant neoplasm of thyroid: Secondary | ICD-10-CM

## 2020-06-23 DIAGNOSIS — C8510 Unspecified B-cell lymphoma, unspecified site: Secondary | ICD-10-CM

## 2020-06-23 DIAGNOSIS — D61810 Antineoplastic chemotherapy induced pancytopenia: Secondary | ICD-10-CM | POA: Diagnosis present

## 2020-06-23 DIAGNOSIS — Z8051 Family history of malignant neoplasm of kidney: Secondary | ICD-10-CM

## 2020-06-23 DIAGNOSIS — C8590 Non-Hodgkin lymphoma, unspecified, unspecified site: Secondary | ICD-10-CM | POA: Diagnosis present

## 2020-06-23 DIAGNOSIS — S8010XA Contusion of unspecified lower leg, initial encounter: Secondary | ICD-10-CM | POA: Diagnosis not present

## 2020-06-23 DIAGNOSIS — Z885 Allergy status to narcotic agent status: Secondary | ICD-10-CM

## 2020-06-23 DIAGNOSIS — R0600 Dyspnea, unspecified: Secondary | ICD-10-CM

## 2020-06-23 DIAGNOSIS — R188 Other ascites: Secondary | ICD-10-CM | POA: Diagnosis not present

## 2020-06-23 DIAGNOSIS — R945 Abnormal results of liver function studies: Secondary | ICD-10-CM

## 2020-06-23 DIAGNOSIS — T451X5A Adverse effect of antineoplastic and immunosuppressive drugs, initial encounter: Secondary | ICD-10-CM | POA: Diagnosis present

## 2020-06-23 DIAGNOSIS — G92 Toxic encephalopathy: Secondary | ICD-10-CM | POA: Diagnosis not present

## 2020-06-23 DIAGNOSIS — D61818 Other pancytopenia: Secondary | ICD-10-CM | POA: Diagnosis present

## 2020-06-23 DIAGNOSIS — R0602 Shortness of breath: Secondary | ICD-10-CM

## 2020-06-23 DIAGNOSIS — R627 Adult failure to thrive: Secondary | ICD-10-CM | POA: Diagnosis present

## 2020-06-23 DIAGNOSIS — E876 Hypokalemia: Secondary | ICD-10-CM | POA: Diagnosis not present

## 2020-06-23 DIAGNOSIS — Z9049 Acquired absence of other specified parts of digestive tract: Secondary | ICD-10-CM

## 2020-06-23 DIAGNOSIS — Z7982 Long term (current) use of aspirin: Secondary | ICD-10-CM

## 2020-06-23 DIAGNOSIS — D6959 Other secondary thrombocytopenia: Secondary | ICD-10-CM | POA: Diagnosis present

## 2020-06-23 DIAGNOSIS — R197 Diarrhea, unspecified: Secondary | ICD-10-CM | POA: Diagnosis present

## 2020-06-23 DIAGNOSIS — J9601 Acute respiratory failure with hypoxia: Secondary | ICD-10-CM | POA: Diagnosis not present

## 2020-06-23 DIAGNOSIS — Z20822 Contact with and (suspected) exposure to covid-19: Secondary | ICD-10-CM | POA: Diagnosis present

## 2020-06-23 DIAGNOSIS — Z79890 Hormone replacement therapy (postmenopausal): Secondary | ICD-10-CM

## 2020-06-23 DIAGNOSIS — J9811 Atelectasis: Secondary | ICD-10-CM | POA: Diagnosis not present

## 2020-06-23 DIAGNOSIS — Z66 Do not resuscitate: Secondary | ICD-10-CM | POA: Diagnosis present

## 2020-06-23 DIAGNOSIS — R1013 Epigastric pain: Secondary | ICD-10-CM | POA: Diagnosis present

## 2020-06-23 DIAGNOSIS — Z833 Family history of diabetes mellitus: Secondary | ICD-10-CM

## 2020-06-23 DIAGNOSIS — N390 Urinary tract infection, site not specified: Secondary | ICD-10-CM

## 2020-06-23 DIAGNOSIS — X58XXXA Exposure to other specified factors, initial encounter: Secondary | ICD-10-CM | POA: Diagnosis not present

## 2020-06-23 DIAGNOSIS — I471 Supraventricular tachycardia: Secondary | ICD-10-CM | POA: Diagnosis not present

## 2020-06-23 DIAGNOSIS — G4700 Insomnia, unspecified: Secondary | ICD-10-CM | POA: Diagnosis not present

## 2020-06-23 DIAGNOSIS — J9602 Acute respiratory failure with hypercapnia: Secondary | ICD-10-CM | POA: Diagnosis not present

## 2020-06-23 DIAGNOSIS — Z9889 Other specified postprocedural states: Secondary | ICD-10-CM

## 2020-06-23 DIAGNOSIS — R630 Anorexia: Secondary | ICD-10-CM | POA: Diagnosis present

## 2020-06-23 DIAGNOSIS — Z8249 Family history of ischemic heart disease and other diseases of the circulatory system: Secondary | ICD-10-CM

## 2020-06-23 DIAGNOSIS — Z79899 Other long term (current) drug therapy: Secondary | ICD-10-CM

## 2020-06-23 DIAGNOSIS — Z87891 Personal history of nicotine dependence: Secondary | ICD-10-CM

## 2020-06-23 DIAGNOSIS — Z801 Family history of malignant neoplasm of trachea, bronchus and lung: Secondary | ICD-10-CM

## 2020-06-23 DIAGNOSIS — F419 Anxiety disorder, unspecified: Secondary | ICD-10-CM | POA: Diagnosis present

## 2020-06-23 DIAGNOSIS — R54 Age-related physical debility: Secondary | ICD-10-CM | POA: Diagnosis present

## 2020-06-23 DIAGNOSIS — E890 Postprocedural hypothyroidism: Secondary | ICD-10-CM | POA: Diagnosis present

## 2020-06-23 DIAGNOSIS — Z85038 Personal history of other malignant neoplasm of large intestine: Secondary | ICD-10-CM

## 2020-06-23 DIAGNOSIS — D72820 Lymphocytosis (symptomatic): Secondary | ICD-10-CM | POA: Diagnosis present

## 2020-06-23 DIAGNOSIS — J90 Pleural effusion, not elsewhere classified: Secondary | ICD-10-CM

## 2020-06-23 DIAGNOSIS — Z823 Family history of stroke: Secondary | ICD-10-CM

## 2020-06-23 DIAGNOSIS — D649 Anemia, unspecified: Secondary | ICD-10-CM | POA: Diagnosis present

## 2020-06-23 DIAGNOSIS — I10 Essential (primary) hypertension: Secondary | ICD-10-CM | POA: Diagnosis present

## 2020-06-23 DIAGNOSIS — R112 Nausea with vomiting, unspecified: Secondary | ICD-10-CM | POA: Diagnosis not present

## 2020-06-23 DIAGNOSIS — N179 Acute kidney failure, unspecified: Secondary | ICD-10-CM | POA: Diagnosis not present

## 2020-06-23 DIAGNOSIS — I480 Paroxysmal atrial fibrillation: Secondary | ICD-10-CM | POA: Diagnosis present

## 2020-06-23 DIAGNOSIS — R161 Splenomegaly, not elsewhere classified: Secondary | ICD-10-CM | POA: Diagnosis present

## 2020-06-23 DIAGNOSIS — Z96652 Presence of left artificial knee joint: Secondary | ICD-10-CM | POA: Diagnosis present

## 2020-06-23 DIAGNOSIS — T148XXA Other injury of unspecified body region, initial encounter: Secondary | ICD-10-CM | POA: Diagnosis not present

## 2020-06-23 DIAGNOSIS — B9620 Unspecified Escherichia coli [E. coli] as the cause of diseases classified elsewhere: Secondary | ICD-10-CM | POA: Diagnosis present

## 2020-06-23 DIAGNOSIS — Z86711 Personal history of pulmonary embolism: Secondary | ICD-10-CM

## 2020-06-23 DIAGNOSIS — D509 Iron deficiency anemia, unspecified: Secondary | ICD-10-CM | POA: Diagnosis present

## 2020-06-23 HISTORY — DX: Malignant neoplasm of thyroid gland: C73

## 2020-06-23 HISTORY — DX: Other pancytopenia: D61.818

## 2020-06-23 HISTORY — DX: Malignant neoplasm of colon, unspecified: C18.9

## 2020-06-23 HISTORY — DX: Non-Hodgkin lymphoma, unspecified, unspecified site: C85.90

## 2020-06-23 LAB — COMPREHENSIVE METABOLIC PANEL
ALT: 11 U/L (ref 0–44)
AST: 31 U/L (ref 15–41)
Albumin: 3.6 g/dL (ref 3.5–5.0)
Alkaline Phosphatase: 98 U/L (ref 38–126)
Anion gap: 12 (ref 5–15)
BUN: 18 mg/dL (ref 8–23)
CO2: 25 mmol/L (ref 22–32)
Calcium: 9.1 mg/dL (ref 8.9–10.3)
Chloride: 104 mmol/L (ref 98–111)
Creatinine, Ser: 1.06 mg/dL — ABNORMAL HIGH (ref 0.44–1.00)
GFR calc Af Amer: 56 mL/min — ABNORMAL LOW (ref >60)
GFR calc non Af Amer: 49 mL/min — ABNORMAL LOW (ref >60)
Glucose, Bld: 105 mg/dL — ABNORMAL HIGH (ref 70–99)
Potassium: 5.1 mmol/L (ref 3.5–5.1)
Sodium: 141 mmol/L (ref 135–145)
Total Bilirubin: 1 mg/dL (ref 0.3–1.2)
Total Protein: 6.5 g/dL (ref 6.5–8.1)

## 2020-06-23 LAB — URINALYSIS, ROUTINE W REFLEX MICROSCOPIC
Bilirubin Urine: NEGATIVE
Glucose, UA: NEGATIVE mg/dL
Ketones, ur: 5 mg/dL — AB
Nitrite: POSITIVE — AB
Protein, ur: 100 mg/dL — AB
Specific Gravity, Urine: 1.015 (ref 1.005–1.030)
WBC, UA: >50 WBC/hpf — ABNORMAL HIGH (ref 0–5)
pH: 5 (ref 5.0–8.0)

## 2020-06-23 LAB — CBC WITH DIFFERENTIAL/PLATELET
Abs Immature Granulocytes: 0.15 10*3/uL — ABNORMAL HIGH (ref 0.00–0.07)
Basophils Absolute: 0 10*3/uL (ref 0.0–0.1)
Basophils Relative: 1 %
Eosinophils Absolute: 0.2 10*3/uL (ref 0.0–0.5)
Eosinophils Relative: 3 %
HCT: 36.7 % (ref 36.0–46.0)
Hemoglobin: 12.5 g/dL (ref 12.0–15.0)
Immature Granulocytes: 2 %
Lymphocytes Relative: 44 %
Lymphs Abs: 2.9 10*3/uL (ref 0.7–4.0)
MCH: 35.2 pg — ABNORMAL HIGH (ref 26.0–34.0)
MCHC: 34.1 g/dL (ref 30.0–36.0)
MCV: 103.4 fL — ABNORMAL HIGH (ref 80.0–100.0)
Monocytes Absolute: 0.5 10*3/uL (ref 0.1–1.0)
Monocytes Relative: 7 %
Neutro Abs: 2.8 10*3/uL (ref 1.7–7.7)
Neutrophils Relative %: 43 %
Platelets: 142 10*3/uL — ABNORMAL LOW (ref 150–400)
RBC: 3.55 MIL/uL — ABNORMAL LOW (ref 3.87–5.11)
RDW: 14.6 % (ref 11.5–15.5)
WBC: 6.5 10*3/uL (ref 4.0–10.5)
nRBC: 0 % (ref 0.0–0.2)

## 2020-06-23 LAB — C DIFFICILE QUICK SCREEN W PCR REFLEX
C Diff antigen: NEGATIVE
C Diff interpretation: NOT DETECTED
C Diff toxin: NEGATIVE

## 2020-06-23 LAB — LACTIC ACID, PLASMA: Lactic Acid, Venous: 0.7 mmol/L (ref 0.5–1.9)

## 2020-06-23 LAB — LIPASE, BLOOD: Lipase: 19 U/L (ref 11–51)

## 2020-06-23 LAB — SARS CORONAVIRUS 2 BY RT PCR (HOSPITAL ORDER, PERFORMED IN ~~LOC~~ HOSPITAL LAB): SARS Coronavirus 2: NEGATIVE

## 2020-06-23 MED ORDER — ONDANSETRON HCL 4 MG/2ML IJ SOLN
4.0000 mg | Freq: Once | INTRAMUSCULAR | Status: AC
  Administered 2020-06-23: 4 mg via INTRAVENOUS
  Filled 2020-06-23: qty 2

## 2020-06-23 MED ORDER — DILTIAZEM HCL ER COATED BEADS 180 MG PO CP24
180.0000 mg | ORAL_CAPSULE | Freq: Every day | ORAL | Status: DC
  Administered 2020-06-24 – 2020-07-21 (×19): 180 mg via ORAL
  Filled 2020-06-23 (×29): qty 1

## 2020-06-23 MED ORDER — SODIUM CHLORIDE 0.9 % IV SOLN: 1.0000 g | Freq: Once | INTRAVENOUS | Status: DC

## 2020-06-23 MED ORDER — PIPERACILLIN-TAZOBACTAM 3.375 G IVPB
3.3750 g | Freq: Three times a day (TID) | INTRAVENOUS | Status: DC
  Administered 2020-06-24 – 2020-06-26 (×10): 3.375 g via INTRAVENOUS
  Filled 2020-06-23 (×9): qty 50

## 2020-06-23 MED ORDER — HEPARIN SODIUM (PORCINE) 5000 UNIT/ML IJ SOLN
5000.0000 [IU] | Freq: Three times a day (TID) | INTRAMUSCULAR | Status: DC
  Administered 2020-06-23 – 2020-06-30 (×18): 5000 [IU] via SUBCUTANEOUS
  Filled 2020-06-23 (×18): qty 1

## 2020-06-23 MED ORDER — PIPERACILLIN-TAZOBACTAM 3.375 G IVPB 30 MIN
3.3750 g | Freq: Once | INTRAVENOUS | Status: AC
  Administered 2020-06-23: 3.375 g via INTRAVENOUS
  Filled 2020-06-23: qty 50

## 2020-06-23 MED ORDER — SODIUM CHLORIDE 0.9 % IV BOLUS
500.0000 mL | Freq: Once | INTRAVENOUS | Status: AC
  Administered 2020-06-23: 500 mL via INTRAVENOUS

## 2020-06-23 MED ORDER — SODIUM CHLORIDE (PF) 0.9 % IJ SOLN
INTRAMUSCULAR | Status: AC
  Filled 2020-06-23: qty 50

## 2020-06-23 MED ORDER — ONDANSETRON HCL 4 MG/2ML IJ SOLN
4.0000 mg | Freq: Four times a day (QID) | INTRAMUSCULAR | Status: DC | PRN
  Administered 2020-06-23 – 2020-07-25 (×23): 4 mg via INTRAVENOUS
  Filled 2020-06-23 (×25): qty 2

## 2020-06-23 MED ORDER — DIPHENHYDRAMINE HCL 25 MG PO CAPS
25.0000 mg | ORAL_CAPSULE | Freq: Every evening | ORAL | Status: DC | PRN
  Administered 2020-06-23 – 2020-07-13 (×13): 25 mg via ORAL
  Filled 2020-06-23 (×14): qty 1

## 2020-06-23 MED ORDER — LACTATED RINGERS IV SOLN: INTRAVENOUS | Status: DC

## 2020-06-23 MED ORDER — IOHEXOL 300 MG/ML  SOLN
100.0000 mL | Freq: Once | INTRAMUSCULAR | Status: AC | PRN
  Administered 2020-06-23: 80 mL via INTRAVENOUS

## 2020-06-23 MED ORDER — CITALOPRAM HYDROBROMIDE 40 MG PO TABS
40.0000 mg | ORAL_TABLET | Freq: Every day | ORAL | Status: DC
  Administered 2020-06-23 – 2020-07-08 (×15): 40 mg via ORAL
  Filled 2020-06-23 (×2): qty 2
  Filled 2020-06-23 (×3): qty 1
  Filled 2020-06-23: qty 2
  Filled 2020-06-23: qty 1
  Filled 2020-06-23 (×2): qty 2
  Filled 2020-06-23 (×3): qty 1
  Filled 2020-06-23 (×4): qty 2
  Filled 2020-06-23: qty 1
  Filled 2020-06-23 (×2): qty 2
  Filled 2020-06-23 (×2): qty 1
  Filled 2020-06-23: qty 2
  Filled 2020-06-23 (×5): qty 1
  Filled 2020-06-23: qty 2
  Filled 2020-06-23: qty 1
  Filled 2020-06-23: qty 2

## 2020-06-23 MED ORDER — ACETAMINOPHEN 325 MG PO TABS
650.0000 mg | ORAL_TABLET | Freq: Four times a day (QID) | ORAL | Status: DC | PRN
  Administered 2020-06-24 – 2020-07-10 (×26): 650 mg via ORAL
  Filled 2020-06-23 (×29): qty 2

## 2020-06-23 MED ORDER — ACETAMINOPHEN 650 MG RE SUPP: 650.0000 mg | Freq: Four times a day (QID) | RECTAL | Status: DC | PRN

## 2020-06-23 MED ORDER — LEVOTHYROXINE SODIUM 112 MCG PO TABS
224.0000 ug | ORAL_TABLET | Freq: Every day | ORAL | Status: DC
  Administered 2020-06-24 – 2020-07-25 (×32): 224 ug via ORAL
  Filled 2020-06-23 (×32): qty 2

## 2020-06-23 MED ORDER — ONDANSETRON HCL 4 MG PO TABS
4.0000 mg | ORAL_TABLET | Freq: Four times a day (QID) | ORAL | Status: DC | PRN
  Administered 2020-06-27 – 2020-07-22 (×7): 4 mg via ORAL
  Filled 2020-06-23 (×8): qty 1

## 2020-06-23 NOTE — Progress Notes (Signed)
Spoke to pharmacist regarding patient home medication cancer med with Calquence.  Stated not to take it because it may be the result of her being admitted to the hospital.  Pt verbalized that she will not take.  No diarrhea or nausea/vomiting since being here.  Welll Continue to monitor.

## 2020-06-23 NOTE — ED Provider Notes (Signed)
[facility] DEPT Provider Note   CSN: [id number] Arrival date & time: 06/23/20  1135     History Chief Complaint  Patient presents with   Emesis   Diarrhea    Tracy Mclaughlin is a 86 y.o. female.  The history is provided by the patient and medical records. No language interpreter was used.  Emesis Associated symptoms: diarrhea   Diarrhea Associated symptoms: vomiting    Tracy Mclaughlin is a 86 y.o. female who presents to the Emergency Department complaining of N/V/D. She has a history of lymphoma and was started on a new medication on Thursday. The medication she is experienced nausea, vomiting, diarrhea. Her diarrhea began three days ago and she describes it as muddy water. She has about three bowel movements daily. She also has associated nausea. Vomiting started today. She has had two episodes of emesis. She also has associated right upper quadrant abdominal pain for the last three days. She denies any fever, dysuria, chest pain. She lives with her husband. No known bad food exposures or sick contacts. She has a history of multiple prior abdominal surgeries. No recent antibiotic use.   Has been vaccinated for [place].      Past Medical History:  Diagnosis Date   Anemia    Arthritis    Atrial fibrillation ([place])    Cancer ([place])    Thyroid/Colon/ B cell Lymphoma-remission   Diverticulosis    History of atrial fibrillation    Was in only in a- fib for 2 days   Hypertension    Non Hodgkin's lymphoma ([place])    Non-Hodgkin's B-Cell Lymphoma   Papillary thyroid carcinoma ([facility])    Paroxysmal atrial fibrillation ([place])    Post-surgical hypothyroidism    Pulmonary embolus ([place]) 11/2011   Subarachnoid hemorrhage ([place]) 1996   Thyroid disease     Patient Active Problem List   Diagnosis Date Noted   Anemia 06/18/2019   Pancytopenia ([place]) 06/18/2019   Port-A-Cath in place 05/18/2019   Goals of care,  counseling/discussion 03/21/2019   Hypersensitivity reaction 09/29/2016   PAF (paroxysmal atrial fibrillation) ([place]) 07/07/2016   Personal history of colonic polyps - cancer and adenoma 04/17/2014   Anemia, iron deficiency 04/02/2014   Heme + stool 04/02/2014   Personal history of colon cancer 04/02/2014   Primary thyroid cancer ([place]) 05/01/2012   Colon cancer ([place]) 11/24/2011   Pulmonary embolism ([place]) 11/24/2011   Non Hodgkin's lymphoma ([place])    Subarachnoid hemorrhage ([place])    Arthritis    Hypertension     Past Surgical History:  Procedure Laterality Date   [place]   S/P Lap Cholecystectomy   COLON RESECTION  11/12   mucinous adenocarcinoma of the cecum, resected   COLONOSCOPY  10/05/2011   COLONOSCOPY N/A 04/17/2014   Procedure: COLONOSCOPY;  Surgeon: Gatha Mayer, MD;  [facility]: [facility];  Service: Endoscopy;  Laterality: N/A;   ESOPHAGOGASTRODUODENOSCOPY N/A 04/17/2014   Procedure: ESOPHAGOGASTRODUODENOSCOPY (EGD);  Surgeon: Gatha Mayer, MD;  Location: [facility];  Service: Endoscopy;  Laterality: N/A;   KNEE ARTHROSCOPY Left 1998   S/P  Arthroscopic Left knee surgery   LYMPH NODE DISSECTION Left 05/04/2019   Procedure: EXCISION DEEP LEFT AXILLARY LYMPH NODES;  Surgeon: Fanny Skates, MD;  Location: [facility];  Service: General;  Laterality: Left;   PORTACATH PLACEMENT N/A 05/15/2019   Procedure: INSERTION PORT-A-CATH WITH ULTRASOUND;  Surgeon: Fanny Skates, MD;  Location: [facility];  Service:  General;  Laterality: N/A;   ROTATOR CUFF REPAIR Right 1998   SUBDURAL HEMATOMA EVACUATION VIA CRANIOTOMY     THYROIDECTOMY  05/23/2012   Procedure: THYROIDECTOMY;  Surgeon: [name], MD;  Location: [facility];  Service: General;  Laterality: N/A;  Total thyroidectomy, central compartment lymph node biopsy times two, reimplantation left superior parathyroid gland.   TUBOPLASTY / TUBOTUBAL ANASTOMOSIS       OB  History   No obstetric history on file.     Family History  Problem Relation Age of Onset   Kidney cancer Sister        ?   Hypertension Sister    Lung cancer Brother    Hypertension Mother    Diabetes Mother    Coronary artery disease Mother    Diabetes Sister        x 2   Stroke Sister    Anesthesia problems Neg Hx    Heart attack Neg Hx     Social History   Tobacco Use   Smoking status: Former Smoker    Packs/day: 1.00    Years: 12.00    Pack years: 12.00    Types: Cigarettes    Quit date: 08/23/1969    Years since quitting: 50.8   Smokeless tobacco: Never Used  Vaping Use   Vaping Use: Never used  Substance Use Topics   Alcohol use: Not Currently    Comment: 1 manhattens per night- Last had 3 months ago   Drug use: No    Home Medications Prior to Admission medications   Medication Sig Start Date End Date Taking? Authorizing Provider  acalabrutinib (CALQUENCE) 100 MG capsule Take 1 capsule (100 mg total) by mouth daily. 06/17/20   [name], MD  aspirin EC 81 MG tablet Take 81 mg by mouth daily.    [provider]  citalopram (CELEXA) 40 MG tablet Take 40 mg by mouth daily.    [provider]  diltiazem (DILACOR XR) 180 MG 24 hr capsule Take 1 capsule (180 mg total) by mouth daily. 12/24/14   Belva Crome, MD  diphenhydrAMINE (BENADRYL) 25 MG tablet Take 25 mg by mouth at bedtime as needed. For itching/sleep    [provider]  levothyroxine (SYNTHROID, LEVOTHROID) 112 MCG tablet Take 224 mcg by mouth daily before breakfast. Pt takes two 112 mcg tablets to equal 224 mcg once a day    [provider]  ondansetron (ZOFRAN) 8 MG tablet Take 1 tablet (8 mg total) by mouth every 8 (eight) hours as needed for nausea or vomiting. Patient not taking: Reported on 04/02/2020 05/10/19   Owens Shark, NP    Allergies    Demerol [meperidine hcl]  Review of Systems   Review of Systems  Gastrointestinal: Positive for  diarrhea and vomiting.  All other systems reviewed and are negative.   Physical Exam Updated Vital Signs BP (!) 130/59 (BP Location: Right Arm)    Pulse 83    Temp 98.1 F (36.7 C) (Oral)    Resp 16    Ht 5\' 8"  (1.727 m)    Wt 78.9 kg    SpO2 94%    BMI 26.46 kg/m   Physical Exam Vitals and nursing note reviewed.  Constitutional:      Appearance: She is well-developed.  HENT:     Head: Normocephalic and atraumatic.  Cardiovascular:     Rate and Rhythm: Normal rate and regular rhythm.     Heart sounds: No  murmur heard.   Pulmonary:     Effort: Pulmonary effort is normal. No respiratory distress.     Breath sounds: Normal breath sounds.  Abdominal:     Palpations: Abdomen is soft.     Tenderness: There is no guarding or rebound.     Comments: Moderate RUQ, RLQ tenderness  Musculoskeletal:        General: No tenderness.  Skin:    General: Skin is warm and dry.  Neurological:     Mental Status: She is alert and oriented to person, place, and time.  Psychiatric:        Behavior: Behavior normal.     ED Results / Procedures / Treatments   Labs (all labs ordered are listed, but only abnormal results are displayed) Labs Reviewed  COMPREHENSIVE METABOLIC PANEL - Abnormal; Notable for the following components:      Result Value   Glucose, Bld 105 (*)    Creatinine, Ser 1.06 (*)    GFR calc non Af Amer 49 (*)    GFR calc Af Amer 56 (*)    All other components within normal limits  CBC WITH DIFFERENTIAL/PLATELET - Abnormal; Notable for the following components:   RBC 3.55 (*)    MCV 103.4 (*)    MCH 35.2 (*)    Platelets 142 (*)    Abs Immature Granulocytes 0.15 (*)    All other components within normal limits  URINALYSIS, ROUTINE W REFLEX MICROSCOPIC - Abnormal; Notable for the following components:   APPearance CLOUDY (*)    Hgb urine dipstick SMALL (*)    Ketones, ur 5 (*)    Protein, ur 100 (*)    Nitrite POSITIVE (*)    Leukocytes,Ua LARGE (*)    WBC, UA >50  (*)    Bacteria, UA RARE (*)    All other components within normal limits  C DIFFICILE QUICK SCREEN W PCR REFLEX  URINE CULTURE  CULTURE, BLOOD (ROUTINE X 2)  CULTURE, BLOOD (ROUTINE X 2)  SARS CORONAVIRUS 2 BY RT PCR (HOSPITAL ORDER, [facility])  LIPASE, BLOOD  LACTIC ACID, PLASMA    EKG EKG Interpretation  Date/Time:  Monday June 23 2020 13:04:32 EDT Ventricular Rate:  76 PR Interval:    QRS Duration: 97 QT Interval:  430 QTC Calculation: 484 R Axis:   35 Text Interpretation: Sinus rhythm Low voltage, precordial leads Confirmed by Quintella Reichert [phone]) on 06/23/2020 1:31:08 PM   Radiology No results found.  Procedures Procedures (including critical care time)  Medications Ordered in ED Medications - No data to display  ED Course  I have reviewed the triage vital signs and the nursing notes.  Pertinent labs & imaging results that were available during my care of the patient were reviewed by me and considered in my medical decision making (see chart for details).    MDM Rules/Calculators/A&P                         Patient with history of lymphoma here for evaluation of abdominal pain, nausea, vomiting, diarrhea. She is abdominal tenderness on examination without peritoneal findings. Given abdominal tenderness a CT abdomen pelvis is obtained. UA is concerning for UTI, will treat with antibiotics. Patient care transferred pending CT abdomen pelvis.  Final Clinical Impression(s) / ED Diagnoses Final diagnoses:  None    Rx / DC Orders ED Discharge Orders    None       Ralene Bathe,  Benjamine Mola, MD 06/23/20 [phone]

## 2020-06-23 NOTE — ED Triage Notes (Signed)
Pt reports for past 4 days had n/v/d and abd pains. Had a recent medication change.  Pt is cancer patient.

## 2020-06-23 NOTE — ED Notes (Signed)
Messaged pharmacy to verify medications 

## 2020-06-23 NOTE — Progress Notes (Signed)
Pharmacy Antibiotic Note  Ezrah Dembeck is a 86 y.o. female admitted on 06/23/2020 with UTI.  Pharmacy has been consulted for Zosyn dosing.  Plan: Zosyn 3.375g IV q8h (4 hour infusion).   F/U renal function, culture results, and clinical course Early de-escalation if possible  Height: 5\' 8"  (172.7 cm) Weight: 78.9 kg (174 lb) IBW/kg (Calculated) : 63.9  Temp (24hrs), Avg:98.1 F (36.7 C), Min:98.1 F (36.7 C), Max:98.1 F (36.7 C)  Recent Labs  Lab 06/23/20 1411  WBC 6.5  CREATININE 1.06*  LATICACIDVEN 0.7    Estimated Creatinine Clearance: 44.4 mL/min (A) (by C-G formula based on SCr of 1.06 mg/dL (H)).    Allergies  Allergen Reactions  . Demerol [Meperidine Hcl] Nausea And Vomiting     Thank you for allowing pharmacy to be a part of this patient's care.  Ulice Dash D 06/23/2020 5:04 PM

## 2020-06-23 NOTE — H&P (Signed)
History and Physical    Tracy Mclaughlin GUR:[id number] DOB: 1934-02-03 DOA: 06/23/2020  PCP: Tracy Collum, PA   Patient coming from: Home  Chief Complaint: Nausea vomiting  HPI: Tracy Mclaughlin is a 86 y.o. 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 [name], hypothyroidism, history of A. fib, pancytopenia presents to the ED with 3-4 day history of nausea vomiting and diarrhea and abdominal pain-and right upper quadrant. symptomts started after new chemo on Thursday.Patient denies any fever chest pain. vomitted x3 today and had non bloody diarrhea 4 times today.  ED Course: Hemodynamically stable, afebrile, UA positive for UTI,CT scan abdomen done that showed free fluid with ascites and mesenteric edema, case was discussed Dr. [name] from oncology advised UTI treatment with Zosyn and he will see the patient in the morning.  Review of Systems: All systems were reviewed and were negative except as mentioned in HPI above. Negative for fever Negative for chest pain Negative for shortness of breath  Past Medical History:  Diagnosis Date  . Anemia   . Arthritis   . Atrial fibrillation ([place])   . Cancer ([place])    Thyroid/Colon/ B cell Lymphoma-remission  . Diverticulosis   . History of atrial fibrillation    Was in only in a- fib for 2 days  . Hypertension   . Non Hodgkin's lymphoma ([place])    Non-Hodgkin's B-Cell Lymphoma  . Papillary thyroid carcinoma ([place])   . Paroxysmal atrial fibrillation ([facility])   . Post-surgical hypothyroidism   . Pulmonary embolus ([place]) 11/2011  . Subarachnoid hemorrhage ([place]) 1996  . Thyroid disease     Past Surgical History:  Procedure Laterality Date  . APPENDECTOMY  1959  . CHOLECYSTECTOMY  1999   S/P Lap Cholecystectomy  . COLON RESECTION  11/12   mucinous adenocarcinoma of the cecum, resected  . COLONOSCOPY  10/05/2011   . COLONOSCOPY N/A 04/17/2014   Procedure: COLONOSCOPY;  Surgeon: Gatha Mayer, MD;  [facility];  Service: Endoscopy;  Laterality: N/A;  . ESOPHAGOGASTRODUODENOSCOPY N/A 04/17/2014   Procedure: ESOPHAGOGASTRODUODENOSCOPY (EGD);  Surgeon: Gatha Mayer, MD;  Location: [facility];  Service: Endoscopy;  Laterality: N/A;  . KNEE ARTHROSCOPY Left 1998   S/P  Arthroscopic Left knee surgery  . LYMPH NODE DISSECTION Left 05/04/2019   Procedure: EXCISION DEEP LEFT AXILLARY LYMPH NODES;  Surgeon: Fanny Skates, MD;  [facility];  Service: General;  Laterality: Left;  . PORTACATH PLACEMENT N/A 05/15/2019   Procedure: INSERTION PORT-A-CATH WITH ULTRASOUND;  Surgeon: Fanny Skates, MD;  [facility];  Service: General;  Laterality: N/A;  . ROTATOR CUFF REPAIR Right 1998  . SUBDURAL HEMATOMA EVACUATION VIA CRANIOTOMY    . THYROIDECTOMY  05/23/2012   Procedure: THYROIDECTOMY;  Surgeon: [name], MD;  Location: [facility];  Service: General;  Laterality: N/A;  Total thyroidectomy, central compartment lymph node biopsy times two, reimplantation left superior parathyroid gland.  . TUBOPLASTY / TUBOTUBAL ANASTOMOSIS       reports that she quit smoking about 50 years ago. Her smoking use included cigarettes. She has a 12.00 pack-year smoking history. She has never used smokeless tobacco. She reports previous alcohol use. She reports that she does not use drugs.  Allergies  Allergen Reactions  . Demerol [Meperidine Hcl] Nausea And Vomiting    Family History  Problem Relation Age of Onset  . Kidney cancer Sister        ?  Tracy Mclaughlin  Hypertension Sister   . Lung cancer Brother   . Hypertension Mother   . Diabetes Mother   . Coronary artery disease Mother   . Diabetes Sister        x 2  . Stroke Sister   . Anesthesia problems Neg Hx   . Heart attack Neg Hx      Prior to Admission medications   Medication Sig Start Date End Date Taking? Authorizing Provider  acalabrutinib  (CALQUENCE) 100 MG capsule Take 1 capsule (100 mg total) by mouth daily. Patient taking differently: Take 100 mg by mouth at bedtime.  06/17/20  Yes [name], MD  citalopram (CELEXA) 40 MG tablet Take 40 mg by mouth daily.   Yes [provider]  diltiazem (DILACOR XR) 180 MG 24 hr capsule Take 1 capsule (180 mg total) by mouth daily. Patient taking differently: Take 180 mg by mouth at bedtime.  12/24/14  Yes Belva Crome, MD  diphenhydrAMINE (BENADRYL) 25 MG tablet Take 25 mg by mouth at bedtime as needed. For itching/sleep   Yes [provider]  levothyroxine (SYNTHROID, LEVOTHROID) 112 MCG tablet Take 224 mcg by mouth daily before breakfast. Pt takes two 112 mcg tablets to equal 224 mcg once a day   Yes [provider]  ondansetron (ZOFRAN) 8 MG tablet Take 1 tablet (8 mg total) by mouth every 8 (eight) hours as needed for nausea or vomiting. Patient not taking: Reported on 04/02/2020 05/10/19   Tracy Shark, NP    Physical Exam: Vitals:   06/23/20 1206  BP: (!) 130/59  Pulse: 83  Resp: 16  Temp: 98.1 F (36.7 C)  TempSrc: Oral  SpO2: 94%  Weight: 78.9 kg  Height: 5\' 8"  (1.727 m)   General exam: AAOx3, NAD, weak appearing. Pleasant. HEENT:Oral mucosa moist, Ear/Nose WNL grossly, dentition normal. Respiratory system: bilaterally clear,no wheezing or crackles,no use of accessory muscle Cardiovascular system: S1 & S2 +, No JVD. Gastrointestinal system: Abdomen soft, ruq in tender, ND,BS+ Nervous System:Alert, awake, moving extremities and grossly nonfocal Extremities: No edema, distal peripheral pulses palpable.  Skin: No rashes,no icterus. MSK: Normal muscle bulk,tone, power.  Labs on Admission: I have personally reviewed following labs and imaging studies  CBC: Recent Labs  Lab 06/23/20 1411  WBC 6.5  NEUTROABS 2.8  HGB 12.5  HCT 36.7  MCV 103.4*  PLT 735*   Basic Metabolic Panel: Recent Labs  Lab 06/23/20 1411  NA 141  K 5.1  CL  104  CO2 25  GLUCOSE 105*  BUN 18  CREATININE 1.06*  CALCIUM 9.1   GFR: Estimated Creatinine Clearance: 44.4 mL/min (A) (by C-G formula based on SCr of 1.06 mg/dL (H)). Liver Function Tests: Recent Labs  Lab 06/23/20 1411  AST 31  ALT 11  ALKPHOS 98  BILITOT 1.0  PROT 6.5  ALBUMIN 3.6   Recent Labs  Lab 06/23/20 1411  LIPASE 19   No results for input(s): AMMONIA in the last 168 hours. Coagulation Profile: No results for input(s): INR, PROTIME in the last 168 hours. Cardiac Enzymes: No results for input(s): CKTOTAL, CKMB, CKMBINDEX, TROPONINI in the last 168 hours. BNP (last 3 results) No results for input(s): PROBNP in the last 8760 hours. HbA1C: No results for input(s): HGBA1C in the last 72 hours. CBG: No results for input(s): GLUCAP in the last 168 hours. Lipid Profile: No results for input(s): CHOL, HDL, LDLCALC, TRIG, CHOLHDL, LDLDIRECT in the last 72 hours. Thyroid Function Tests: No results  for input(s): TSH, T4TOTAL, FREET4, T3FREE, THYROIDAB in the last 72 hours. Anemia Panel: No results for input(s): VITAMINB12, FOLATE, FERRITIN, TIBC, IRON, RETICCTPCT in the last 72 hours. Urine analysis:    Component Value Date/Time   COLORURINE YELLOW 06/23/2020 1411   APPEARANCEUR CLOUDY (A) 06/23/2020 1411   LABSPEC 1.015 06/23/2020 1411   PHURINE 5.0 06/23/2020 1411   GLUCOSEU NEGATIVE 06/23/2020 1411   HGBUR SMALL (A) 06/23/2020 1411   BILIRUBINUR NEGATIVE 06/23/2020 1411   KETONESUR 5 (A) 06/23/2020 1411   PROTEINUR 100 (A) 06/23/2020 1411   UROBILINOGEN 0.2 05/19/2012 1139   NITRITE POSITIVE (A) 06/23/2020 1411   LEUKOCYTESUR LARGE (A) 06/23/2020 1411    Radiological Exams on Admission: CT Abdomen Pelvis W Contrast  Result Date: 06/23/2020 CLINICAL DATA:  86 year old female with abdominal pain, nausea and vomiting. History of lymphoma. EXAM: CT ABDOMEN AND PELVIS WITH CONTRAST TECHNIQUE: Multidetector CT imaging of the abdomen and pelvis was performed  using the standard protocol following bolus administration of intravenous contrast. CONTRAST:  60mL OMNIPAQUE IOHEXOL 300 MG/ML  SOLN COMPARISON:  CT of the chest abdomen pelvis dated 05/22/2020. FINDINGS: Lower chest: Partially visualized moderate left and trace right pleural effusions, new since the prior CT. There is associated partial compressive atelectasis of the left lower lobe. Pneumonia is not excluded. Clinical correlation is recommended. No intra-abdominal free air. Interval development of a small ascites, new or significantly increased since the prior CT. Hepatobiliary: A 1 cm hypodense focus along the posterior liver capsule is not characterized but may represent a cyst or focal area of scarring. This is similar to prior CT. Subcentimeter hypodense focus in the anterior liver is too small to characterize. There is mild intrahepatic biliary ductal dilatation, likely post cholecystectomy. The common bile duct is dilated measuring 14 mm. No retained calcified stone noted in the central CBD. Pancreas: The pancreas is unremarkable as visualized. Spleen: Mildly enlarged spleen measuring 15 cm in craniocaudal length. Adrenals/Urinary Tract: The adrenal glands are poorly visualized but grossly unremarkable. There is mild fullness of the renal collecting systems bilaterally without frank hydronephrosis. Mild hazy appearance of the urothelium noted. Correlation with urinalysis recommended to exclude UTI. There is symmetric enhancement and excretion of contrast by both kidneys. Subcentimeter left renal hypodense focus is not characterized. The urinary bladder is partially distended. Probable chronic bladder wall thickening and perivesical stranding. Correlation with urinalysis recommended to exclude UTI. Stomach/Bowel: There is sigmoid diverticulosis without definite active inflammatory changes. There is postsurgical changes of bowel with anastomotic suture in the right lower quadrant. No definite evidence of  bowel obstruction. Evaluation however is limited in the absence of oral contrast. Vascular/Lymphatic: There is moderate aortoiliac atherosclerotic disease. The IVC is unremarkable as visualized. The SMV, splenic vein, and main portal vein are patent. No portal venous gas. Bulky retroperitoneal adenopathy encasing the abdominal aorta and IVC as well as enlarged bilateral iliac chain lymph nodes in keeping with history of lymphoma. Overall the degree of adenopathy is relatively similar to prior CT. Reproductive: The uterus is poorly visualized. Other: Diffuse mesenteric edema, new since the prior CT. There is scattered mesenteric adenopathy as seen previously. Musculoskeletal: Degenerative changes of the spine. Minimal compression fracture of the anterior superior and inferior endplates of the L3, new since the prior CT, likely acute or subacute. IMPRESSION: 1. Interval development of partially visualized moderate left and trace right pleural effusions, as well as development of diffuse mesenteric edema and ascites, new since the prior CT. 2. Bulky retroperitoneal and iliac  chain adenopathy in keeping with history of lymphoma. Overall the degree of adenopathy is relatively similar to prior CT. 3. Mild splenomegaly. 4. Minimal compression fracture of the superior and inferior endplate of L3, likely acute or subacute. 5. Sigmoid diverticulosis. 6. Aortic Atherosclerosis (ICD10-I70.0). Electronically Signed   By: Anner Crete M.D.   On: 06/23/2020 16:39    Assessment/Plan  Intractable nausea vomiting diarrhea and abdominal pain : Nausea vomiting and diarrhea ikely from new chemotherapy. Abdominal pain likely from ascites. CT scan shows new ascites in the setting of lymphoma. Will admit the patient continued supportive care, nausea medication pain control, diet as tolerated STARTING WITH CLEARS.add gentle ivf. c diff ordered in ED, los suspicion.await oncology evaluation to see if patient benefits with  paracentesis.  UTI : Patient immunosuppressed continue Zosyn as per hematology oncology follow-up urine culture.   Non Hodgkin's lymphoma-with progressive decline, patient was started past week on BTK INHIBITOR since he did not have significant response to chemotherapy last year. Oncology has been consulted. Now has new onset ascites likely from her lymphoma. Oncology consulted and will be seeing in the morning, will see if she needs paracentesis.   Hypertension: BP is controlled cont  home Cardizem w/ holding parameters.  Pancytopenia anemia mild thrombocytopenia in the setting of lymphoma. Monitor lab  History of intermittent atrial fibrillation. On Cardizem at home.  Hypothyroidism resume home Synthroid.  Body mass index is 26.46 kg/m.   Severity of Illness: * I certify that at the point of admission it is my clinical judgment that the patient will require less than 2 midnight stay    DVT prophylaxis: heparin Code Status:DNR  Family Communication: Admission, patients condition and plan of care including tests being ordered have been discussed with the patient who indicate understanding and agree with the plan and Code Status.  Consults called:   Antonieta Pert MD [facility]  If 7PM-7AM, please contact night-coverage [email or web address]  06/23/2020, 5:40 PM

## 2020-06-23 NOTE — ED Provider Notes (Signed)
  Physical Exam  BP (!) 130/59 (BP Location: Right Arm)   Pulse 83   Temp 98.1 F (36.7 C) (Oral)   Resp 16   Ht 5\' 8"  (1.727 m)   Wt 78.9 kg   SpO2 94%   BMI 26.46 kg/m   Physical Exam  ED Course/Procedures     Procedures  MDM  Care assumed at 4:30 PM.  Patient was recently started on oral chemotherapy for failed treatment for lymphoma.  Patient had previous abdominal surgeries and had some abdominal distention.  Labs unremarkable and UA showed positive UTI.  Signout pending CT abdomen pelvis.  5:07 PM CT showed free fluid with ascites and mesenteric edema.  Lactate is normal.  I discussed case with Dr. Benay Spice from oncology.  Since patient has UTI, he agreed with Zosyn.  He thinks the ascites is likely secondary to worsening lymphoma and the vomiting likely from side effect of oral chemo.  Will admit for observation and Dr. [name] will see patient in the morning as a consult to see if patient needs a paracentesis or not.   Drenda Freeze, MD 06/23/20 [phone]

## 2020-06-24 DIAGNOSIS — R161 Splenomegaly, not elsewhere classified: Secondary | ICD-10-CM | POA: Diagnosis present

## 2020-06-24 DIAGNOSIS — N390 Urinary tract infection, site not specified: Secondary | ICD-10-CM | POA: Diagnosis present

## 2020-06-24 DIAGNOSIS — I480 Paroxysmal atrial fibrillation: Secondary | ICD-10-CM | POA: Diagnosis present

## 2020-06-24 DIAGNOSIS — I10 Essential (primary) hypertension: Secondary | ICD-10-CM | POA: Diagnosis present

## 2020-06-24 DIAGNOSIS — R0602 Shortness of breath: Secondary | ICD-10-CM | POA: Diagnosis not present

## 2020-06-24 DIAGNOSIS — J9602 Acute respiratory failure with hypercapnia: Secondary | ICD-10-CM | POA: Diagnosis not present

## 2020-06-24 DIAGNOSIS — R112 Nausea with vomiting, unspecified: Secondary | ICD-10-CM | POA: Diagnosis present

## 2020-06-24 DIAGNOSIS — R197 Diarrhea, unspecified: Secondary | ICD-10-CM | POA: Diagnosis present

## 2020-06-24 DIAGNOSIS — R000 Tachycardia, unspecified: Secondary | ICD-10-CM | POA: Diagnosis not present

## 2020-06-24 DIAGNOSIS — J90 Pleural effusion, not elsewhere classified: Secondary | ICD-10-CM | POA: Diagnosis not present

## 2020-06-24 DIAGNOSIS — R188 Other ascites: Secondary | ICD-10-CM | POA: Diagnosis present

## 2020-06-24 DIAGNOSIS — Z20822 Contact with and (suspected) exposure to covid-19: Secondary | ICD-10-CM | POA: Diagnosis present

## 2020-06-24 DIAGNOSIS — C8514 Unspecified B-cell lymphoma, lymph nodes of axilla and upper limb: Secondary | ICD-10-CM

## 2020-06-24 DIAGNOSIS — D61818 Other pancytopenia: Secondary | ICD-10-CM | POA: Diagnosis not present

## 2020-06-24 DIAGNOSIS — J9811 Atelectasis: Secondary | ICD-10-CM | POA: Diagnosis not present

## 2020-06-24 DIAGNOSIS — D509 Iron deficiency anemia, unspecified: Secondary | ICD-10-CM | POA: Diagnosis present

## 2020-06-24 DIAGNOSIS — D61810 Antineoplastic chemotherapy induced pancytopenia: Secondary | ICD-10-CM | POA: Diagnosis present

## 2020-06-24 DIAGNOSIS — D6959 Other secondary thrombocytopenia: Secondary | ICD-10-CM | POA: Diagnosis present

## 2020-06-24 DIAGNOSIS — C8510 Unspecified B-cell lymphoma, unspecified site: Secondary | ICD-10-CM | POA: Diagnosis present

## 2020-06-24 DIAGNOSIS — R180 Malignant ascites: Secondary | ICD-10-CM | POA: Diagnosis not present

## 2020-06-24 DIAGNOSIS — R630 Anorexia: Secondary | ICD-10-CM | POA: Diagnosis present

## 2020-06-24 DIAGNOSIS — G92 Toxic encephalopathy: Secondary | ICD-10-CM | POA: Diagnosis not present

## 2020-06-24 DIAGNOSIS — Z66 Do not resuscitate: Secondary | ICD-10-CM | POA: Diagnosis present

## 2020-06-24 DIAGNOSIS — N179 Acute kidney failure, unspecified: Secondary | ICD-10-CM | POA: Diagnosis not present

## 2020-06-24 DIAGNOSIS — B9620 Unspecified Escherichia coli [E. coli] as the cause of diseases classified elsewhere: Secondary | ICD-10-CM | POA: Diagnosis present

## 2020-06-24 DIAGNOSIS — D649 Anemia, unspecified: Secondary | ICD-10-CM | POA: Diagnosis not present

## 2020-06-24 DIAGNOSIS — X58XXXA Exposure to other specified factors, initial encounter: Secondary | ICD-10-CM | POA: Diagnosis not present

## 2020-06-24 DIAGNOSIS — D72820 Lymphocytosis (symptomatic): Secondary | ICD-10-CM | POA: Diagnosis present

## 2020-06-24 DIAGNOSIS — J9601 Acute respiratory failure with hypoxia: Secondary | ICD-10-CM | POA: Diagnosis not present

## 2020-06-24 DIAGNOSIS — E876 Hypokalemia: Secondary | ICD-10-CM | POA: Diagnosis not present

## 2020-06-24 DIAGNOSIS — I471 Supraventricular tachycardia: Secondary | ICD-10-CM | POA: Diagnosis not present

## 2020-06-24 DIAGNOSIS — I361 Nonrheumatic tricuspid (valve) insufficiency: Secondary | ICD-10-CM | POA: Diagnosis not present

## 2020-06-24 LAB — CBC
HCT: 34.7 % — ABNORMAL LOW (ref 36.0–46.0)
Hemoglobin: 11.4 g/dL — ABNORMAL LOW (ref 12.0–15.0)
MCH: 34.2 pg — ABNORMAL HIGH (ref 26.0–34.0)
MCHC: 32.9 g/dL (ref 30.0–36.0)
MCV: 104.2 fL — ABNORMAL HIGH (ref 80.0–100.0)
Platelets: 121 10*3/uL — ABNORMAL LOW (ref 150–400)
RBC: 3.33 MIL/uL — ABNORMAL LOW (ref 3.87–5.11)
RDW: 14.7 % (ref 11.5–15.5)
WBC: 6.3 10*3/uL (ref 4.0–10.5)
nRBC: 0 % (ref 0.0–0.2)

## 2020-06-24 LAB — COMPREHENSIVE METABOLIC PANEL
ALT: 9 U/L (ref 0–44)
AST: 29 U/L (ref 15–41)
Albumin: 3.3 g/dL — ABNORMAL LOW (ref 3.5–5.0)
Alkaline Phosphatase: 90 U/L (ref 38–126)
Anion gap: 9 (ref 5–15)
BUN: 15 mg/dL (ref 8–23)
CO2: 26 mmol/L (ref 22–32)
Calcium: 8.5 mg/dL — ABNORMAL LOW (ref 8.9–10.3)
Chloride: 103 mmol/L (ref 98–111)
Creatinine, Ser: 0.95 mg/dL (ref 0.44–1.00)
GFR calc Af Amer: >60 mL/min (ref >60)
GFR calc non Af Amer: 55 mL/min — ABNORMAL LOW (ref >60)
Glucose, Bld: 105 mg/dL — ABNORMAL HIGH (ref 70–99)
Potassium: 4.3 mmol/L (ref 3.5–5.1)
Sodium: 138 mmol/L (ref 135–145)
Total Bilirubin: 1 mg/dL (ref 0.3–1.2)
Total Protein: 6 g/dL — ABNORMAL LOW (ref 6.5–8.1)

## 2020-06-24 MED ORDER — CHLORHEXIDINE GLUCONATE CLOTH 2 % EX PADS
6.0000 | MEDICATED_PAD | Freq: Every day | CUTANEOUS | Status: DC
  Administered 2020-06-24 – 2020-07-25 (×29): 6 via TOPICAL

## 2020-06-24 MED ORDER — LOPERAMIDE HCL 2 MG PO CAPS
2.0000 mg | ORAL_CAPSULE | ORAL | Status: DC | PRN
  Administered 2020-06-24 – 2020-07-04 (×5): 2 mg via ORAL
  Filled 2020-06-24 (×5): qty 1

## 2020-06-24 MED ORDER — SODIUM CHLORIDE 0.9% FLUSH
10.0000 mL | INTRAVENOUS | Status: DC | PRN
  Administered 2020-07-04 – 2020-07-25 (×3): 10 mL

## 2020-06-24 NOTE — Progress Notes (Addendum)
HEMATOLOGY-ONCOLOGY PROGRESS NOTE  SUBJECTIVE: Reports nausea but no vomiting this morning.  Reported having 5 loose stools overnight.  States stools are becoming more formed.  Planning to start a full liquid diet later today.  Oncology History  Non Hodgkin's lymphoma ([place])  09/29/2016 Initial Diagnosis   Non Hodgkin's lymphoma ([place])   03/22/2019 - 04/23/2019 Chemotherapy   The patient had palonosetron (ALOXI) injection 0.25 mg, 0.25 mg, Intravenous,  Once, 1 of 4 cycles Administration: 0.25 mg (03/22/2019) riTUXimab (RITUXAN) 800 mg in sodium chloride 0.9 % 250 mL (2.4242 mg/mL) infusion, 375 mg/m2 = 800 mg, Intravenous,  Once, 1 of 4 cycles Administration: 800 mg (03/22/2019) bendamustine (BENDEKA) 150 mg in sodium chloride 0.9 % 50 mL (2.6786 mg/mL) chemo infusion, 70 mg/m2 = 150 mg (100 % of original dose 70 mg/m2), Intravenous,  Once, 1 of 4 cycles Dose modification: 70 mg/m2 (original dose 70 mg/m2, Cycle 1, Reason: Provider Judgment) Administration: 150 mg (03/22/2019), 150 mg (03/23/2019)  for chemotherapy treatment.    05/18/2019 - 06/28/2019 Chemotherapy   The patient had DOXOrubicin (ADRIAMYCIN) chemo injection 82 mg, 40 mg/m2 = 82 mg (100 % of original dose 40 mg/m2), Intravenous,  Once, 2 of 6 cycles Dose modification: 40 mg/m2 (original dose 40 mg/m2, Cycle 1, Reason: Provider Judgment) Administration: 82 mg (05/18/2019), 82 mg (06/08/2019) palonosetron (ALOXI) injection 0.25 mg, 0.25 mg, Intravenous,  Once, 2 of 6 cycles Administration: 0.25 mg (05/18/2019), 0.25 mg (06/08/2019) pegfilgrastim (NEULASTA ONPRO KIT) injection 6 mg, 6 mg, Subcutaneous, Once, 2 of 6 cycles Administration: 6 mg (05/18/2019), 6 mg (06/08/2019) vinCRIStine (ONCOVIN) 1 mg in sodium chloride 0.9 % 50 mL chemo infusion, 1 mg (100 % of original dose 1 mg), Intravenous,  Once, 2 of 6 cycles Dose modification: 1 mg (original dose 1 mg, Cycle 1, Reason: Provider Judgment) Administration: 1 mg (05/18/2019), 1 mg  (06/08/2019) riTUXimab (RITUXAN) 800 mg in sodium chloride 0.9 % 250 mL (2.4242 mg/mL) infusion, 375 mg/m2 = 800 mg, Intravenous,  Once, 2 of 6 cycles Administration: 800 mg (05/18/2019), 800 mg (06/08/2019) cyclophosphamide (CYTOXAN) 1,240 mg in sodium chloride 0.9 % 250 mL chemo infusion, 600 mg/m2 = 1,540 mg, Intravenous,  Once, 2 of 6 cycles Dose modification: 600 mg/m2 (original dose 750 mg/m2, Cycle 2, Reason: Provider Judgment) Administration: 1,240 mg (05/18/2019), 1,240 mg (06/08/2019) fosaprepitant (EMEND) 150 mg, dexamethasone (DECADRON) 12 mg in sodium chloride 0.9 % 145 mL IVPB, , Intravenous,  Once, 2 of 6 cycles Administration:  (05/18/2019),  (06/08/2019)  for chemotherapy treatment.    07/19/2019 -  Chemotherapy   The patient had riTUXimab (RITUXAN) 700 mg in sodium chloride 0.9 % 250 mL (2.1875 mg/mL) infusion, 375 mg/m2 = 700 mg, Intravenous,  Once, 11 of 11 cycles Administration: 700 mg (07/19/2019), 700 mg (08/16/2019), 700 mg (09/13/2019), 700 mg (10/10/2019), 700 mg (11/07/2019), 700 mg (12/07/2019), 700 mg (01/04/2020), 700 mg (02/01/2020), 700 mg (03/05/2020), 700 mg (04/02/2020), 700 mg (04/30/2020) riTUXimab-pvvr (RUXIENCE) 700 mg in sodium chloride 0.9 % 250 mL (2.1875 mg/mL) infusion, 375 mg/m2 = 700 mg (original dose ), Intravenous,  Once, 0 of 1 cycle Dose modification: 375 mg/m2 (Cycle 12)  for chemotherapy treatment.     PHYSICAL EXAMINATION:  Vitals:   06/24/20 0549 06/24/20 1000  BP: (!) 158/73 140/70  Pulse: 74 66  Resp: 18 18  Temp: 97.8 F (36.6 C) 98 F (36.7 C)  SpO2: 94% 98%   Filed Weights   06/23/20 1206  Weight: 78.9 kg    Intake/Output  from previous day: 07/05 0701 - 07/06 0700 In: 1090.1 [P.O.:170; I.V.:370.2; IV Piggyback:549.9] Out: 701 [Urine:700; Stool:1]  GENERAL:alert, no distress and comfortable NECK: supple, thyroid normal size, non-tender, without nodularity LYMPH: Approximately 1 cm left supraclavicular lymph node LUNGS: clear to  auscultation and percussion with normal breathing effort HEART: regular rate & rhythm and no murmurs and no lower extremity edema ABDOMEN:abdomen soft, non-tender and normal bowel sounds NEURO: alert & oriented x 3 with fluent speech, no focal motor/sensory deficits  LABORATORY DATA:  I have reviewed the data as listed CMP Latest Ref Rng & Units 06/24/2020 06/23/2020 05/28/2020  Glucose 70 - 99 mg/dL 105(H) 105(H) 95  BUN 8 - 23 mg/dL '15 18 13  ' Creatinine 0.44 - 1.00 mg/dL 0.95 1.06(H) 1.12(H)  Sodium 135 - 145 mmol/L 138 141 143  Potassium 3.5 - 5.1 mmol/L 4.3 5.1 4.5  Chloride 98 - 111 mmol/L 103 104 108  CO2 22 - 32 mmol/L '26 25 22  ' Calcium 8.9 - 10.3 mg/dL 8.5(L) 9.1 9.0  Total Protein 6.5 - 8.1 g/dL 6.0(L) 6.5 -  Total Bilirubin 0.3 - 1.2 mg/dL 1.0 1.0 -  Alkaline Phos 38 - 126 U/L 90 98 -  AST 15 - 41 U/L 29 31 -  ALT 0 - 44 U/L 9 11 -    Lab Results  Component Value Date   WBC 6.3 06/24/2020   HGB 11.4 (L) 06/24/2020   HCT 34.7 (L) 06/24/2020   MCV 104.2 (H) 06/24/2020   PLT 121 (L) 06/24/2020   NEUTROABS 2.8 06/23/2020    CT Abdomen Pelvis W Contrast  Result Date: 06/23/2020 CLINICAL DATA:  86 year old female with abdominal pain, nausea and vomiting. History of lymphoma. EXAM: CT ABDOMEN AND PELVIS WITH CONTRAST TECHNIQUE: Multidetector CT imaging of the abdomen and pelvis was performed using the standard protocol following bolus administration of intravenous contrast. CONTRAST:  33m OMNIPAQUE IOHEXOL 300 MG/ML  SOLN COMPARISON:  CT of the chest abdomen pelvis dated 05/22/2020. FINDINGS: Lower chest: Partially visualized moderate left and trace right pleural effusions, new since the prior CT. There is associated partial compressive atelectasis of the left lower lobe. Pneumonia is not excluded. Clinical correlation is recommended. No intra-abdominal free air. Interval development of a small ascites, new or significantly increased since the prior CT. Hepatobiliary: A 1 cm hypodense  focus along the posterior liver capsule is not characterized but may represent a cyst or focal area of scarring. This is similar to prior CT. Subcentimeter hypodense focus in the anterior liver is too small to characterize. There is mild intrahepatic biliary ductal dilatation, likely post cholecystectomy. The common bile duct is dilated measuring 14 mm. No retained calcified stone noted in the central CBD. Pancreas: The pancreas is unremarkable as visualized. Spleen: Mildly enlarged spleen measuring 15 cm in craniocaudal length. Adrenals/Urinary Tract: The adrenal glands are poorly visualized but grossly unremarkable. There is mild fullness of the renal collecting systems bilaterally without frank hydronephrosis. Mild hazy appearance of the urothelium noted. Correlation with urinalysis recommended to exclude UTI. There is symmetric enhancement and excretion of contrast by both kidneys. Subcentimeter left renal hypodense focus is not characterized. The urinary bladder is partially distended. Probable chronic bladder wall thickening and perivesical stranding. Correlation with urinalysis recommended to exclude UTI. Stomach/Bowel: There is sigmoid diverticulosis without definite active inflammatory changes. There is postsurgical changes of bowel with anastomotic suture in the right lower quadrant. No definite evidence of bowel obstruction. Evaluation however is limited in the absence of oral contrast.  Vascular/Lymphatic: There is moderate aortoiliac atherosclerotic disease. The IVC is unremarkable as visualized. The SMV, splenic vein, and main portal vein are patent. No portal venous gas. Bulky retroperitoneal adenopathy encasing the abdominal aorta and IVC as well as enlarged bilateral iliac chain lymph nodes in keeping with history of lymphoma. Overall the degree of adenopathy is relatively similar to prior CT. Reproductive: The uterus is poorly visualized. Other: Diffuse mesenteric edema, new since the prior CT.  There is scattered mesenteric adenopathy as seen previously. Musculoskeletal: Degenerative changes of the spine. Minimal compression fracture of the anterior superior and inferior endplates of the L3, new since the prior CT, likely acute or subacute. IMPRESSION: 1. Interval development of partially visualized moderate left and trace right pleural effusions, as well as development of diffuse mesenteric edema and ascites, new since the prior CT. 2. Bulky retroperitoneal and iliac chain adenopathy in keeping with history of lymphoma. Overall the degree of adenopathy is relatively similar to prior CT. 3. Mild splenomegaly. 4. Minimal compression fracture of the superior and inferior endplate of L3, likely acute or subacute. 5. Sigmoid diverticulosis. 6. Aortic Atherosclerosis (ICD10-I70.0). Electronically Signed   By: Anner Crete M.D.   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 Status post weekly Rituxan x4 03/01/2012 through 03/22/2012. She completed 4 "maintenance" doses of Rituxan, last on 12/19/2012. A restaging CT on 02/09/2013 showed no evidence of lymphoma.   Lymph node lateral to the thyroid bed on a neck ultrasound 02/21/2014, status post an FNA biopsy concerning for a lymphoproliferative disorder.  PET scan 09/28/2016 with active lymphoma within the neck, chest, abdomen, pelvis; splenic enlargement and hypermetabolism suspicious for splenic involvement.  Initiation of Rituxan weekly 4 09/29/2016  Initiation of maintenance Rituxan on a 3 month schedule 12/23/2016; final Rituxan 08/31/2018  Thyroid ultrasound 02/07/2019-left cervical lymphadenopathy  PET scan 03/08/2019-extensive recurrent hypermetabolic lymphoma involving the neck, chest, abdomen and pelvis.  03/16/2019 left cervical lymph node biopsy-features consistent with previously  diagnosed non-Hodgkin's B-cell lymphoma, phenotypically consistent with marginal zone lymphoma. Flow cytometry with lambda restricted B-cell population without expression of CD5 or CD10 comprising 87% of all lymphocytes.  Cycle 1 bendamustine/Rituxan 03/22/2019  Excision deep left axillary lymph nodes 05/04/2019-non-Hodgkin's B-cell lymphoma with differential including a marginal zone lymphoma and atypical small lymphocytic lymphoma. Flow cytometry with monoclonal B-cell population without expression of CD5 or CD10, comprises 96% of all lymphocytes.  Cycle 1 [facility]OP/Rituxan 05/18/2019  Cycle 2 [facility]/Rituxan 06/08/2019  CTs 06/15/2019-partial improvement in diffuse adenopathy, stable mild splenomegaly  Cycle 1 Revlimid/rituximab 07/19/2019 (Revlimid start 07/20/2019)  Cycle 2 Revlimid/rituximab 08/16/2019 (Revlimid placed on hold 08/30/2019 due to neutropenia)  Cycle 3 of Revlimid/rituximab 09/13/2019 (Revlimid schedule changed to 14 days on/14 days off)  Cycle 4 Revlimid/rituximab 10/10/2019  Cycle 5 Revlimid/rituximab 11/07/2019  Cycle 6 Revlimid/rituximab 12/07/2019  CTs 12/28/2019-diffuse lymphadenopathy-slightly increased compared to 06/15/2019  Cycle 7 Revlimid/rituximab 01/04/2020  Cycle 8 Revlimid/Rituxan 02/01/2020  Cycle 9 Revlimid/Rituxan 03/05/2020  Cycle 10 Revlimid/Rituxan 04/02/2020  Cycle 11 Revlimid/Rituxan 04/30/2020  CT 05/22/2020-mild increase in left supraclavicular adenopathy, mild increase in chest, retroperitoneal, and pelvic adenopathy. Stable mild splenomegaly.  Acalabrutinib started 06/19/2020 2. Stage IV (T1bN1b M0) papillary thyroid cancer, status post a thyroidectomy with reimplantation of the left superior parathyroid gland on 05/23/2012, status post radioactive iodine therapy, followed by Dr. Buddy Duty.  3. Stage II (T3 N0) colon cancer, status post a right colectomy 10/19/2011,  last colonoscopy April 2015-sigmoid adenoma removed.  4. History of a pulmonary embolism  December 2012.  5. History of Atrial fibrillation 6. Iron deficiency anemia-new 03/18/2014. Hemoccult positive stool. The anemia corrected with iron. No longer taking iron.  Status post an upper endoscopy and colonoscopy by Dr. Carlean Purl April 2015 with no bleeding source identified, benign adenoma removed from the sigmoid colon. 7. Report of an upper gastric intestinal bleed fall 2016-managed in [place]. [name] 8. Left knee replacement May 2017, repeat left knee surgery May 2018 9. Pruritic rash 07/22/2016 10. Nausea and diarrhea 04/02/2019-stool negative for C. difficile toxin 11. 06/18/2019 [facility] admission for symptomatic anemia. 12.  Admission 06/23/2020 with nausea and diarrhea  Ms. Gerety is now admitted for nausea, vomiting, diarrhea possibly secondary to chemotherapy.  Calquence has been placed on hold.  She still has some mild nausea but is no longer vomiting and diarrhea seems to be improving.  She will be starting a full liquid diet later today.  Recommendations: 1.  Continue to hold chemotherapy. 2.  Begin full liquid diet and advance as tolerated. 3.  Recommend as needed Imodium and antiemetics. 4.  Continue antibiotics for treatment of the urinary tract infection, follow-up cultures   LOS: 0 days   Mikey Bussing, DNP, AGPCNP-BC, AOCNP 06/24/20 Ms. Thibodaux was interviewed and examined.  I reviewed the CT images from yesterday.  She began out acalabrutinib on 06/19/2020 for treatment of progressive lymphoma.  She reports developing nausea and diarrhea 06/21/2020.  It is unclear whether her symptoms are related to the underlying lymphoma, acalabrutinib, or another etiology.  I reviewed the CT images.  She has increased ascites compared to the CT last month.  This may be related to lymphoma.  The palpable neck lymph nodes are smaller on exam today and the retroperitoneal lymph nodes are stable on CT.  I recommend holding out acalabrutinib to look for resolution of diarrhea.  We may  consider resuming acalabrutinib within the next few days.

## 2020-06-24 NOTE — Telephone Encounter (Signed)
Oral Oncology Patient Advocate Encounter  Prior Authorization for Calquence has been approved.    PA# [id number] Effective dates: 12/21/19 through 12/19/20  Patients co-pay is $60  Oral Oncology Clinic will continue to follow.    [facility] Phone [phone] Fax [phone] 06/24/2020 1:15 PM

## 2020-06-24 NOTE — Progress Notes (Signed)
PROGRESS NOTE    Tracy Mclaughlin  GDJ:[id number] DOB: 07/02/1934 DOA: 06/23/2020 PCP: [name], PA   Chef Complaints: nausea and vomiting  Brief Narrative:  86 y.o. 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 [name], hypothyroidism, history of A. fib, pancytopenia presents to the ED with 3-4 day history of nausea vomiting and diarrhea and abdominal pain-and right upper quadrant. symptomts started after new chemo on Thursday.Patient denies any fever chest pain. vomitted x3 today and had non bloody diarrhea 4 times today.  ED Course: Hemodynamically stable, afebrile, UA positive for UTI,CT scan abdomen done that showed free fluid with ascites and mesenteric edema, case was discussed Dr. [name] from oncology advised UTI treatment with Zosyn and he will see the patient in the morning.  Subjective: C/o " bad night" NAUSEOUS, RT UPPER ABD STILL HURTING BUT NOT AS INTENSE and still with loose stool- went 5 x during night. Saw Dr [name] this am.  Assessment & Plan:  Intractable nausea vomiting diarrhea and abdominal pain :  Likely from patient's recent chemo, will hold for now continue on supportive care with IV fluids nausea medication.  CT abdomen shows no ascites.  Continue current management.  C. difficile is negative.  Add Imodium for symptomatic control, continue Zofran and IV fluids.  On full liquid diet advance as tolerated  Ascites new onset suspect from lymphoma.  Await for further oncology input to see if patient needs to have paracentesis done.  Seen by oncology this morning will await input.  UTI QQI:WLNLGXQ immunosuppressed continue Zosyn as per hematology oncology follow-up urine culture.   Non Hodgkin's lymphoma-with progressive decline, patient was started past week on BTK INHIBITOR since he did not have significant response to chemotherapy last  year. Oncology has been consulted and saw the patient this morning await further input.  Holding chemo for now.  Hypertension: BP well controlled continue home Cardizem bedtime.  Pancytopenia anemia mild thrombocytopenia in the setting of lymphoma.  Labs overall stable.  Will monitor closely.  History of intermittent atrial fibrillation. On Cardizem at home.   DVT prophylaxis: heparin injection 5,000 Units Start: 06/23/20 2200 Code Status: DNR Family Communication: plan of care discussed with patient at bedside.  Status is: admitted as Observation  Patient remains symptomatic with nausea, ongoing diarrhea and remains hospitalized for further management await for diet tolerance, further work-up and consultation by hematology oncology.  Dispo: The patient is from: Home              Anticipated d/c is to: Home              Anticipated d/c date is: 2 days              Patient currently is not medically stable to d/c.  Nutrition: Diet Order            Diet full liquid Room service appropriate? Yes; Fluid consistency: Thin  Diet effective now                       Body mass index is 26.46 kg/m.  Consultants:see note  Procedures:see note Microbiology:see note  Medications: Scheduled Meds: . Chlorhexidine Gluconate Cloth  6 each Topical Daily  . citalopram  40 mg Oral Daily  . diltiazem  180 mg Oral QHS  . heparin  5,000 Units Subcutaneous Q8H  . levothyroxine  224 mcg Oral  Q0600   Continuous Infusions: . lactated ringers 50 mL/hr at 06/23/20 2213  . piperacillin-tazobactam (ZOSYN)  IV 3.375 g (06/24/20 0101)    Antimicrobials: Anti-infectives (From admission, onward)   Start     Dose/Rate Route Frequency Ordered Stop   06/24/20 0200  piperacillin-tazobactam (ZOSYN) IVPB 3.375 g     Discontinue     3.375 g 12.5 mL/hr over 240 Minutes Intravenous Every 8 hours 06/23/20 1703     06/23/20 1645  piperacillin-tazobactam (ZOSYN) IVPB 3.375 g        3.375 g 100 mL/hr  over 30 Minutes Intravenous  Once 06/23/20 1638 06/23/20 1834   06/23/20 1615  cefTRIAXone (ROCEPHIN) 1 g in sodium chloride 0.9 % 100 mL IVPB  Status:  Discontinued        1 g 200 mL/hr over 30 Minutes Intravenous  Once 06/23/20 1607 06/23/20 1608       Objective: Vitals: Today's Vitals   06/24/20 0048 06/24/20 0052 06/24/20 0145 06/24/20 0549  BP:  (!) 154/63  (!) 158/73  Pulse:  74  74  Resp:  18  18  Temp:  97.6 F (36.4 C)  97.8 F (36.6 C)  TempSrc:  Oral  Oral  SpO2:  94%  94%  Weight:      Height:      PainSc: 7   Asleep     Intake/Output Summary (Last 24 hours) at 06/24/2020 0835 Last data filed at 06/24/2020 0600 Gross per 24 hour  Intake 1090.08 ml  Output 701 ml  Net 389.08 ml   Filed Weights   06/23/20 1206  Weight: 78.9 kg   Weight change:    Intake/Output from previous day: 07/05 0701 - 07/06 0700 In: 1090.1 [P.O.:170; I.V.:370.2; IV Piggyback:549.9] Out: 701 [Urine:700; Stool:1] Intake/Output this shift: No intake/output data recorded.  Examination:  General exam: AAOx3,NAD, weak appearing. HEENT:Oral mucosa moist, Ear/Nose WNL grossly,dentition normal. Respiratory system: bilaterally clear,no wheezing or crackles,no use of accessory muscle, non tender. Cardiovascular system: S1 & S2 +,regular, No JVD. Gastrointestinal system: Abdomen soft, full, RUQ mildly tender,ND, BS+. Nervous System:Alert, awake, moving extremities and grossly nonfocal Extremities: No edema, distal peripheral pulses palpable.  Skin: No rashes,no icterus. MSK: Normal muscle bulk,tone, power  Data Reviewed: I have personally reviewed following labs and imaging studies CBC: Recent Labs  Lab 06/23/20 1411 06/24/20 0454  WBC 6.5 6.3  NEUTROABS 2.8  --   HGB 12.5 11.4*  HCT 36.7 34.7*  MCV 103.4* 104.2*  PLT 142* 622*   Basic Metabolic Panel: Recent Labs  Lab 06/23/20 1411 06/24/20 0454  NA 141 138  K 5.1 4.3  CL 104 103  CO2 25 26  GLUCOSE 105* 105*  BUN 18  15  CREATININE 1.06* 0.95  CALCIUM 9.1 8.5*   GFR: Estimated Creatinine Clearance: 49.5 mL/min (by C-G formula based on SCr of 0.95 mg/dL). Liver Function Tests: Recent Labs  Lab 06/23/20 1411 06/24/20 0454  AST 31 29  ALT 11 9  ALKPHOS 98 90  BILITOT 1.0 1.0  PROT 6.5 6.0*  ALBUMIN 3.6 3.3*   Recent Labs  Lab 06/23/20 1411  LIPASE 19   No results for input(s): AMMONIA in the last 168 hours. Coagulation Profile: No results for input(s): INR, PROTIME in the last 168 hours. Cardiac Enzymes: No results for input(s): CKTOTAL, CKMB, CKMBINDEX, TROPONINI in the last 168 hours. BNP (last 3 results) No results for input(s): PROBNP in the last 8760 hours. HbA1C: No results for input(s): HGBA1C in the  last 72 hours. CBG: No results for input(s): GLUCAP in the last 168 hours. Lipid Profile: No results for input(s): CHOL, HDL, LDLCALC, TRIG, CHOLHDL, LDLDIRECT in the last 72 hours. Thyroid Function Tests: No results for input(s): TSH, T4TOTAL, FREET4, T3FREE, THYROIDAB in the last 72 hours. Anemia Panel: No results for input(s): VITAMINB12, FOLATE, FERRITIN, TIBC, IRON, RETICCTPCT in the last 72 hours. Sepsis Labs: Recent Labs  Lab 06/23/20 1411  LATICACIDVEN 0.7    Recent Results (from the past 240 hour(s))  C Difficile Quick Screen w PCR reflex     Status: None   Collection Time: 06/23/20  4:26 PM   Specimen: Nasopharyngeal Swab; Stool  Result Value Ref Range Status   C Diff antigen NEGATIVE NEGATIVE Final   C Diff toxin NEGATIVE NEGATIVE Final   C Diff interpretation No C. difficile detected.  Final    Comment: Performed at [facility], [place] [address] [place], [place] [address]  Blood culture (routine x 2)     Status: None (Preliminary result)   Collection Time: 06/23/20  5:58 PM   Specimen: BLOOD  Result Value Ref Range Status   Specimen Description   Final    BLOOD SITE NOT SPECIFIED Performed at [facility] [address],  [place], [place] [address]    Special Requests   Final    BOTTLES DRAWN AEROBIC AND ANAEROBIC Blood Culture adequate volume Performed at [facility] [address], [place], [place] [address]    Culture PENDING  Incomplete   Report Status PENDING  Incomplete  Blood culture (routine x 2)     Status: None (Preliminary result)   Collection Time: 06/23/20  5:58 PM   Specimen: BLOOD  Result Value Ref Range Status   Specimen Description   Final    BLOOD SITE NOT SPECIFIED Performed at [facility], [place] [address] [place], [place] [address]    Special Requests   Final    BOTTLES DRAWN AEROBIC AND ANAEROBIC Blood Culture adequate volume Performed at [place] [address] [place], [place] [address]    Culture PENDING  Incomplete   Report Status PENDING  Incomplete  SARS Coronavirus 2 by RT PCR (hospital order, performed in [facility] hospital lab) Nasopharyngeal Nasopharyngeal Swab     Status: None   Collection Time: 06/23/20  5:58 PM   Specimen: Nasopharyngeal Swab  Result Value Ref Range Status   SARS Coronavirus 2 NEGATIVE NEGATIVE Final    Comment: (NOTE) SARS-CoV-2 target nucleic acids are NOT DETECTED.  The SARS-CoV-2 RNA is generally detectable in upper and lower respiratory specimens during the acute phase of infection. The lowest concentration of SARS-CoV-2 viral copies this assay can detect is 250 copies / mL. A negative result does not preclude SARS-CoV-2 infection and should not be used as the sole basis for treatment or other patient management decisions.  A negative result may occur with improper specimen collection / handling, submission of specimen other than nasopharyngeal swab, presence of viral mutation(s) within the areas targeted by this assay, and inadequate number of viral copies (<250 copies / mL). A negative result must be combined with clinical observations, patient history, and epidemiological  information.  Fact Sheet for Patients:   StrictlyIdeas.no  Fact Sheet for Healthcare Providers: BankingDealers.co.za  This test is not yet approved or  cleared by the [place] FDA and has been authorized for detection and/or diagnosis of SARS-CoV-2 by FDA under an Emergency Use  Authorization (EUA).  This EUA will remain in effect (meaning this test can be used) for the duration of the COVID-19 declaration under Section 564(b)(1) of the Act, 21 U.S.C. section 360bbb-3(b)(1), unless the authorization is terminated or revoked sooner.  Performed at [facility], [place] [address] [place], [place] [address]       Radiology Studies: CT Abdomen Pelvis W Contrast  Result Date: 06/23/2020 CLINICAL DATA:  86 year old female with abdominal pain, nausea and vomiting. History of lymphoma. EXAM: CT ABDOMEN AND PELVIS WITH CONTRAST TECHNIQUE: Multidetector CT imaging of the abdomen and pelvis was performed using the standard protocol following bolus administration of intravenous contrast. CONTRAST:  93mL OMNIPAQUE IOHEXOL 300 MG/ML  SOLN COMPARISON:  CT of the chest abdomen pelvis dated 05/22/2020. FINDINGS: Lower chest: Partially visualized moderate left and trace right pleural effusions, new since the prior CT. There is associated partial compressive atelectasis of the left lower lobe. Pneumonia is not excluded. Clinical correlation is recommended. No intra-abdominal free air. Interval development of a small ascites, new or significantly increased since the prior CT. Hepatobiliary: A 1 cm hypodense focus along the posterior liver capsule is not characterized but may represent a cyst or focal area of scarring. This is similar to prior CT. Subcentimeter hypodense focus in the anterior liver is too small to characterize. There is mild intrahepatic biliary ductal dilatation, likely post cholecystectomy. The common bile duct is dilated  measuring 14 mm. No retained calcified stone noted in the central CBD. Pancreas: The pancreas is unremarkable as visualized. Spleen: Mildly enlarged spleen measuring 15 cm in craniocaudal length. Adrenals/Urinary Tract: The adrenal glands are poorly visualized but grossly unremarkable. There is mild fullness of the renal collecting systems bilaterally without frank hydronephrosis. Mild hazy appearance of the urothelium noted. Correlation with urinalysis recommended to exclude UTI. There is symmetric enhancement and excretion of contrast by both kidneys. Subcentimeter left renal hypodense focus is not characterized. The urinary bladder is partially distended. Probable chronic bladder wall thickening and perivesical stranding. Correlation with urinalysis recommended to exclude UTI. Stomach/Bowel: There is sigmoid diverticulosis without definite active inflammatory changes. There is postsurgical changes of bowel with anastomotic suture in the right lower quadrant. No definite evidence of bowel obstruction. Evaluation however is limited in the absence of oral contrast. Vascular/Lymphatic: There is moderate aortoiliac atherosclerotic disease. The IVC is unremarkable as visualized. The SMV, splenic vein, and main portal vein are patent. No portal venous gas. Bulky retroperitoneal adenopathy encasing the abdominal aorta and IVC as well as enlarged bilateral iliac chain lymph nodes in keeping with history of lymphoma. Overall the degree of adenopathy is relatively similar to prior CT. Reproductive: The uterus is poorly visualized. Other: Diffuse mesenteric edema, new since the prior CT. There is scattered mesenteric adenopathy as seen previously. Musculoskeletal: Degenerative changes of the spine. Minimal compression fracture of the anterior superior and inferior endplates of the L3, new since the prior CT, likely acute or subacute. IMPRESSION: 1. Interval development of partially visualized moderate left and trace right  pleural effusions, as well as development of diffuse mesenteric edema and ascites, new since the prior CT. 2. Bulky retroperitoneal and iliac chain adenopathy in keeping with history of lymphoma. Overall the degree of adenopathy is relatively similar to prior CT. 3. Mild splenomegaly. 4. Minimal compression fracture of the superior and inferior endplate of L3, likely acute or subacute. 5. Sigmoid diverticulosis. 6. Aortic Atherosclerosis (ICD10-I70.0). Electronically Signed   By: Anner Crete M.D.   On: 06/23/2020 16:39  LOS: 0 days   Antonieta Pert, MD [facility]  06/24/2020, 8:35 AM

## 2020-06-25 LAB — LACTATE DEHYDROGENASE: LDH: 285 U/L — ABNORMAL HIGH (ref 98–192)

## 2020-06-25 MED ORDER — MELATONIN 3 MG PO TABS
3.0000 mg | ORAL_TABLET | Freq: Every day | ORAL | Status: DC
  Administered 2020-06-25 – 2020-07-24 (×20): 3 mg via ORAL
  Filled 2020-06-25 (×25): qty 1

## 2020-06-25 NOTE — Plan of Care (Signed)

## 2020-06-25 NOTE — Progress Notes (Signed)
PROGRESS NOTE    Tracy Mclaughlin  ZOX:[id number] DOB: 08-22-1934 DOA: 06/23/2020 PCP: [name], PA   Chef Complaints: Nuaea vomiting  Brief Narrative:  86 y.o. 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 [name], hypothyroidism, history of A. fib, pancytopenia presents to the ED with 3-4 day history of nausea vomiting and diarrhea and abdominal pain-and right upper quadrant. symptomts started after new chemo on Thursday.Patient denies any fever chest pain. vomitted x3 today and had non bloody diarrhea 4 times today.  ED Course: Hemodynamically stable, afebrile, UA positive for UTI,CT scan abdomen done that showed free fluid with ascites and mesenteric edema, case was discussed Dr. [name] from oncology advised UTI treatment with Zosyn and he will see the patient in the morning.  Subjective:  Did not sleep well.  no more vomiting Has nausea- ?chronic less diarrhea Feels much better overall, ready to order regular diet  Assessment & Plan:  Intractable nausea vomiting diarrhea and abdominal pain :  ?cuase- from chemo vs lymphoma. Appreciate onco inputs- holding acalabrutinib today, advancing diet to regular and monitoring for tolerance. CT abdomen shows new ascites.C. difficile is negative. Cont prn imodium, zofran, wean dwo nivf.  Ascites new onset suspect from lymphoma. Op f/u , monitor. No plan for tapping  E coli UTI WJX:BJYNWGN immunosuppressed continue Zosyn until c/s back.   Non Hodgkin's lymphoma-with progressive decline, patient was started past week on BTK INHIBITOR since he did not have significant response to chemotherapy last year.  Holding chemo for now. LDH imprving 456 ( 4 wk ago) > 285 today. Oncolgy on board and appreciate inputs  Hypertension: BP well controlled continue home Cardizem bedtime.  Pancytopenia anemia mild  thrombocytopenia in the setting of lymphoma.  Labs overall stable.  Will monitor closely.  History of intermittent atrial fibrillation. On Cardizem at home.  DVT prophylaxis: heparin injection 5,000 Units Start: 06/23/20 2200 Code Status: DNR Family Communication: plan of care discussed with patient at bedside.  Status is: admitted as Observation  Patient remains symptomatic with nausea, ongoing diarrhea and remains hospitalized for further management await for diet tolerance, further work-up and consultation by hematology oncology.  Dispo: The patient is from: Home              Anticipated d/c is to: Home get PT eval today              Anticipated d/c date is: 1 day              Patient currently is not medically stable to d/c.  Nutrition: Diet Order            Diet regular Room service appropriate? Yes; Fluid consistency: Thin  Diet effective now                       Body mass index is 26.46 kg/m.  Consultants:see note  Procedures:see note Microbiology:see note  Medications: Scheduled Meds: . Chlorhexidine Gluconate Cloth  6 each Topical Daily  . citalopram  40 mg Oral Daily  . diltiazem  180 mg Oral QHS  . heparin  5,000 Units Subcutaneous Q8H  . levothyroxine  224 mcg Oral Q0600  . melatonin  3 mg Oral QHS   Continuous Infusions: . lactated ringers 50 mL/hr at 06/25/20 0716  . piperacillin-tazobactam (ZOSYN)  IV 3.375 g (06/25/20 5621)    Antimicrobials: Anti-infectives (From admission,  onward)   Start     Dose/Rate Route Frequency Ordered Stop   06/24/20 0200  piperacillin-tazobactam (ZOSYN) IVPB 3.375 g     Discontinue     3.375 g 12.5 mL/hr over 240 Minutes Intravenous Every 8 hours 06/23/20 1703     06/23/20 1645  piperacillin-tazobactam (ZOSYN) IVPB 3.375 g        3.375 g 100 mL/hr over 30 Minutes Intravenous  Once 06/23/20 1638 06/23/20 1834   06/23/20 1615  cefTRIAXone (ROCEPHIN) 1 g in sodium chloride 0.9 % 100 mL IVPB  Status:  Discontinued         1 g 200 mL/hr over 30 Minutes Intravenous  Once 06/23/20 1607 06/23/20 1608       Objective: Vitals: Today's Vitals   06/25/20 0312 06/25/20 0412 06/25/20 0454 06/25/20 0748  BP:   (!) 151/60   Pulse:   69   Resp:   18   Temp:   98.1 F (36.7 C)   TempSrc:      SpO2:   94%   Weight:      Height:      PainSc: 7  Asleep  Asleep    Intake/Output Summary (Last 24 hours) at 06/25/2020 0913 Last data filed at 06/25/2020 0500 Gross per 24 hour  Intake 2348.48 ml  Output 1300 ml  Net 1048.48 ml   Filed Weights   06/23/20 1206  Weight: 78.9 kg   Weight change:    Intake/Output from previous day: 07/06 0701 - 07/07 0700 In: 2348.5 [P.O.:900; I.V.:1198.5; IV Piggyback:250] Out: 1300 [Urine:1300] Intake/Output this shift: No intake/output data recorded.  Examination:  General exam: AAOx3, weak,NAD, weak appearing. HEENT:Oral mucosa moist, Ear/Nose WNL grossly, dentition normal. Respiratory system: bilaterally clear,no wheezing or crackles,no use of accessory muscle Cardiovascular system: S1 & S2 +, No JVD,. Gastrointestinal system: Abdomen soft, mildly tender RUQ, BS+ Nervous System:Alert, awake, moving extremities and grossly nonfocal Extremities: No edema, distal peripheral pulses palpable.  Skin: No rashes,no icterus. MSK: Normal muscle bulk,tone, power  Data Reviewed: I have personally reviewed following labs and imaging studies CBC: Recent Labs  Lab 06/23/20 1411 06/24/20 0454  WBC 6.5 6.3  NEUTROABS 2.8  --   HGB 12.5 11.4*  HCT 36.7 34.7*  MCV 103.4* 104.2*  PLT 142* 412*   Basic Metabolic Panel: Recent Labs  Lab 06/23/20 1411 06/24/20 0454  NA 141 138  K 5.1 4.3  CL 104 103  CO2 25 26  GLUCOSE 105* 105*  BUN 18 15  CREATININE 1.06* 0.95  CALCIUM 9.1 8.5*   GFR: Estimated Creatinine Clearance: 49.5 mL/min (by C-G formula based on SCr of 0.95 mg/dL). Liver Function Tests: Recent Labs  Lab 06/23/20 1411 06/24/20 0454  AST 31 29  ALT  11 9  ALKPHOS 98 90  BILITOT 1.0 1.0  PROT 6.5 6.0*  ALBUMIN 3.6 3.3*   Recent Labs  Lab 06/23/20 1411  LIPASE 19   No results for input(s): AMMONIA in the last 168 hours. Coagulation Profile: No results for input(s): INR, PROTIME in the last 168 hours. Cardiac Enzymes: No results for input(s): CKTOTAL, CKMB, CKMBINDEX, TROPONINI in the last 168 hours. BNP (last 3 results) No results for input(s): PROBNP in the last 8760 hours. HbA1C: No results for input(s): HGBA1C in the last 72 hours. CBG: No results for input(s): GLUCAP in the last 168 hours. Lipid Profile: No results for input(s): CHOL, HDL, LDLCALC, TRIG, CHOLHDL, LDLDIRECT in the last 72 hours. Thyroid Function Tests: No results  for input(s): TSH, T4TOTAL, FREET4, T3FREE, THYROIDAB in the last 72 hours. Anemia Panel: No results for input(s): VITAMINB12, FOLATE, FERRITIN, TIBC, IRON, RETICCTPCT in the last 72 hours. Sepsis Labs: Recent Labs  Lab 06/23/20 1411  LATICACIDVEN 0.7    Recent Results (from the past 240 hour(s))  Urine culture     Status: Abnormal (Preliminary result)   Collection Time: 06/23/20  2:11 PM   Specimen: Urine, Random  Result Value Ref Range Status   Specimen Description   Final    URINE, RANDOM Performed at [facility] [address], [place], [place] [address]    Special Requests   Final    NONE Performed at [facility], [place] [address], [place], [place] [address]    Culture (A)  Final    >=100,000 COLONIES/mL ESCHERICHIA COLI SUSCEPTIBILITIES TO FOLLOW Performed at [facility], [place] [address], [place], [place] [address]    Report Status PENDING  Incomplete  C Difficile Quick Screen w PCR reflex     Status: None   Collection Time: 06/23/20  4:26 PM   Specimen: Nasopharyngeal Swab; Stool  Result Value Ref Range Status   C Diff antigen NEGATIVE NEGATIVE Final   C Diff toxin NEGATIVE NEGATIVE Final   C Diff interpretation No C.  difficile detected.  Final    Comment: Performed at [facility], [place] [address], [place], [place] [address]  Blood culture (routine x 2)     Status: None (Preliminary result)   Collection Time: 06/23/20  5:58 PM   Specimen: BLOOD  Result Value Ref Range Status   Specimen Description   Final    BLOOD SITE NOT SPECIFIED Performed at [facility] [address], [place], [place] [address]    Special Requests   Final    BOTTLES DRAWN AEROBIC AND ANAEROBIC Blood Culture adequate volume Performed at [facility] [address], [place], [place] [address]    Culture   Final    NO GROWTH < 24 HOURS Performed at [facility] [address] [place], [place] [address]    Report Status PENDING  Incomplete  Blood culture (routine x 2)     Status: None (Preliminary result)   Collection Time: 06/23/20  5:58 PM   Specimen: BLOOD  Result Value Ref Range Status   Specimen Description   Final    BLOOD SITE NOT SPECIFIED Performed at [facility] [address] [place], [place] [address]    Special Requests   Final    BOTTLES DRAWN AEROBIC AND ANAEROBIC Blood Culture adequate volume Performed at [facility] [address] [place], [place] [address]    Culture   Final    NO GROWTH < 24 HOURS Performed at [facility] [address], [place], [place] [address]    Report Status PENDING  Incomplete  SARS Coronavirus 2 by RT PCR (hospital order, performed in [facility] hospital lab) Nasopharyngeal Nasopharyngeal Swab     Status: None   Collection Time: 06/23/20  5:58 PM   Specimen: Nasopharyngeal Swab  Result Value Ref Range Status   SARS Coronavirus 2 NEGATIVE NEGATIVE Final    Comment: (NOTE) SARS-CoV-2 target nucleic acids are NOT DETECTED.  The SARS-CoV-2 RNA is generally detectable in upper and lower respiratory specimens during the acute phase of infection. The lowest concentration of SARS-CoV-2  viral copies this assay can detect is 250 copies /  mL. A negative result does not preclude SARS-CoV-2 infection and should not be used as the sole basis for treatment or other patient management decisions.  A negative result may occur with improper specimen collection / handling, submission of specimen other than nasopharyngeal swab, presence of viral mutation(s) within the areas targeted by this assay, and inadequate number of viral copies (<250 copies / mL). A negative result must be combined with clinical observations, patient history, and epidemiological information.  Fact Sheet for Patients:   StrictlyIdeas.no  Fact Sheet for Healthcare Providers: BankingDealers.co.za  This test is not yet approved or  cleared by the [place] FDA and has been authorized for detection and/or diagnosis of SARS-CoV-2 by FDA under an Emergency Use Authorization (EUA).  This EUA will remain in effect (meaning this test can be used) for the duration of the COVID-19 declaration under Section 564(b)(1) of the Act, 21 U.S.C. section 360bbb-3(b)(1), unless the authorization is terminated or revoked sooner.  Performed at [facility], [place] [address], [place], [place] [address]       Radiology Studies: CT Abdomen Pelvis W Contrast  Result Date: 06/23/2020 CLINICAL DATA:  86 year old female with abdominal pain, nausea and vomiting. History of lymphoma. EXAM: CT ABDOMEN AND PELVIS WITH CONTRAST TECHNIQUE: Multidetector CT imaging of the abdomen and pelvis was performed using the standard protocol following bolus administration of intravenous contrast. CONTRAST:  85mL OMNIPAQUE IOHEXOL 300 MG/ML  SOLN COMPARISON:  CT of the chest abdomen pelvis dated 05/22/2020. FINDINGS: Lower chest: Partially visualized moderate left and trace right pleural effusions, new since the prior CT. There is associated partial compressive atelectasis of the left  lower lobe. Pneumonia is not excluded. Clinical correlation is recommended. No intra-abdominal free air. Interval development of a small ascites, new or significantly increased since the prior CT. Hepatobiliary: A 1 cm hypodense focus along the posterior liver capsule is not characterized but may represent a cyst or focal area of scarring. This is similar to prior CT. Subcentimeter hypodense focus in the anterior liver is too small to characterize. There is mild intrahepatic biliary ductal dilatation, likely post cholecystectomy. The common bile duct is dilated measuring 14 mm. No retained calcified stone noted in the central CBD. Pancreas: The pancreas is unremarkable as visualized. Spleen: Mildly enlarged spleen measuring 15 cm in craniocaudal length. Adrenals/Urinary Tract: The adrenal glands are poorly visualized but grossly unremarkable. There is mild fullness of the renal collecting systems bilaterally without frank hydronephrosis. Mild hazy appearance of the urothelium noted. Correlation with urinalysis recommended to exclude UTI. There is symmetric enhancement and excretion of contrast by both kidneys. Subcentimeter left renal hypodense focus is not characterized. The urinary bladder is partially distended. Probable chronic bladder wall thickening and perivesical stranding. Correlation with urinalysis recommended to exclude UTI. Stomach/Bowel: There is sigmoid diverticulosis without definite active inflammatory changes. There is postsurgical changes of bowel with anastomotic suture in the right lower quadrant. No definite evidence of bowel obstruction. Evaluation however is limited in the absence of oral contrast. Vascular/Lymphatic: There is moderate aortoiliac atherosclerotic disease. The IVC is unremarkable as visualized. The SMV, splenic vein, and main portal vein are patent. No portal venous gas. Bulky retroperitoneal adenopathy encasing the abdominal aorta and IVC as well as enlarged bilateral iliac  chain lymph nodes in keeping with history of lymphoma. Overall the degree of adenopathy is relatively similar to prior CT. Reproductive: The uterus is poorly visualized. Other: Diffuse mesenteric edema, new since the prior CT. There is scattered mesenteric adenopathy  as seen previously. Musculoskeletal: Degenerative changes of the spine. Minimal compression fracture of the anterior superior and inferior endplates of the L3, new since the prior CT, likely acute or subacute. IMPRESSION: 1. Interval development of partially visualized moderate left and trace right pleural effusions, as well as development of diffuse mesenteric edema and ascites, new since the prior CT. 2. Bulky retroperitoneal and iliac chain adenopathy in keeping with history of lymphoma. Overall the degree of adenopathy is relatively similar to prior CT. 3. Mild splenomegaly. 4. Minimal compression fracture of the superior and inferior endplate of L3, likely acute or subacute. 5. Sigmoid diverticulosis. 6. Aortic Atherosclerosis (ICD10-I70.0). Electronically Signed   By: Anner Crete M.D.   On: 06/23/2020 16:39     LOS: 1 day   Antonieta Pert, MD [facility]  06/25/2020, 9:13 AM

## 2020-06-25 NOTE — Evaluation (Signed)
Physical Therapy Evaluation Patient Details Name: Tracy Mclaughlin MRN: [id number] DOB: 1934-04-05 Today's Date: 06/25/2020   History of Present Illness  86 yo female admitted with N/V/D, abd pain, UTI, ascites. Hx of NHL, hypothyroidism, A fib, anemia, OA, PE, SAH  Clinical Impression  On eval, pt required Min assist for mobility. She walked ~100 feet with use of her cane and IV pole. At baseline, pt stated she only uses a cane however she did not appear able to safely ambulate with only a cane during this session. LOB x 2 with pt leaning into wall to help steady herself. Dyspnea 3/4. O2 93% on RA. Discussed d/c plan-pt will return home where she lives with her husband. Feel she could benefit from HHPT f/u however she declines this.     Follow Up Recommendations Supervision/Assistance - 24 hour (could benefit from HHPT but pt declines f/u)    Equipment Recommendations  None recommended by PT    Recommendations for Other Services       Precautions / Restrictions Precautions Precautions: Fall Restrictions Weight Bearing Restrictions: No      Mobility  Bed Mobility Overal bed mobility: Modified Independent                Transfers Overall transfer level: Needs assistance Equipment used: Straight cane Transfers: Sit to/from Stand Sit to Stand: Min guard         General transfer comment: Multiple attempts (x3) to get to standing but pt was able to do it unassisted.  Ambulation/Gait Ambulation/Gait assistance: Min assist Gait Distance (Feet): 100 Feet Assistive device: IV Pole;Straight cane Gait Pattern/deviations: Step-through pattern;Decreased stride length     General Gait Details: Assist to stabilize pt throughout distance. Unsteady with LOB (pt leaning into wall for support) x 2. Two brief standing rest breaks. Dyspnea 3/4. O2 93% on RA.  Stairs            Wheelchair Mobility    Modified Rankin (Stroke Patients Only)       Balance Overall  balance assessment: Needs assistance         Standing balance support: Bilateral upper extremity supported Standing balance-Leahy Scale: Poor                               Pertinent Vitals/Pain Pain Assessment: No/denies pain    Home Living Family/patient expects to be discharged to:: Private residence Living Arrangements: Spouse/significant other Available Help at Discharge: Family Type of Home: House         Home Equipment: Gilford Rile - 2 wheels;Cane - single point;Bedside commode;Shower seat      Prior Function Level of Independence: Independent with assistive device(s)         Comments: uses cane. pt stated she performs bathing, dressing unassisted. husband takes care of all household tasks     Hand Dominance        Extremity/Trunk Assessment   Upper Extremity Assessment Upper Extremity Assessment: Generalized weakness    Lower Extremity Assessment Lower Extremity Assessment: Generalized weakness    Cervical / Trunk Assessment Cervical / Trunk Assessment: Kyphotic  Communication   Communication: No difficulties  Cognition Arousal/Alertness: Awake/alert Behavior During Therapy: WFL for tasks assessed/performed Overall Cognitive Status: Within Functional Limits for tasks assessed  General Comments      Exercises     Assessment/Plan    PT Assessment Patient needs continued PT services  PT Problem List Decreased strength;Decreased mobility;Decreased activity tolerance;Decreased balance       PT Treatment Interventions DME instruction;Gait training;Therapeutic activities;Therapeutic exercise;Patient/family education;Balance training;Functional mobility training    PT Goals (Current goals can be found in the Care Plan section)  Acute Rehab PT Goals Patient Stated Goal: home soon PT Goal Formulation: With patient Time For Goal Achievement: 07/09/20 Potential to Achieve Goals:  Good    Frequency Min 3X/week   Barriers to discharge        Co-evaluation               AM-PAC PT "6 Clicks" Mobility  Outcome Measure Help needed turning from your back to your side while in a flat bed without using bedrails?: None Help needed moving from lying on your back to sitting on the side of a flat bed without using bedrails?: None Help needed moving to and from a bed to a chair (including a wheelchair)?: A Little Help needed standing up from a chair using your arms (e.g., wheelchair or bedside chair)?: A Little Help needed to walk in hospital room?: A Little Help needed climbing 3-5 steps with a railing? : A Little 6 Click Score: 20    End of Session Equipment Utilized During Treatment: Gait belt Activity Tolerance: Patient limited by fatigue Patient left: in chair;with call bell/phone within reach;with chair alarm set   PT Visit Diagnosis: Unsteadiness on feet (R26.81);Muscle weakness (generalized) (M62.81)    Time: 6333-5456 PT Time Calculation (min) (ACUTE ONLY): 14 min   Charges:   PT Evaluation $PT Eval Low Complexity: [place], PT Acute Rehabilitation  Office: [phone] Pager: [phone]

## 2020-06-25 NOTE — Plan of Care (Signed)

## 2020-06-25 NOTE — Progress Notes (Addendum)
HEMATOLOGY-ONCOLOGY PROGRESS NOTE  SUBJECTIVE: She continues to have nausea.  She has frequent bowel movements, but the stool is more formed.  She wants to eat today.  She reports exertional dyspnea. Oncology History  Non Hodgkin's lymphoma ([place])  09/29/2016 Initial Diagnosis   Non Hodgkin's lymphoma ([place])   03/22/2019 - 04/23/2019 Chemotherapy   The patient had palonosetron (ALOXI) injection 0.25 mg, 0.25 mg, Intravenous,  Once, 1 of 4 cycles Administration: 0.25 mg (03/22/2019) riTUXimab (RITUXAN) 800 mg in sodium chloride 0.9 % 250 mL (2.4242 mg/mL) infusion, 375 mg/m2 = 800 mg, Intravenous,  Once, 1 of 4 cycles Administration: 800 mg (03/22/2019) bendamustine (BENDEKA) 150 mg in sodium chloride 0.9 % 50 mL (2.6786 mg/mL) chemo infusion, 70 mg/m2 = 150 mg (100 % of original dose 70 mg/m2), Intravenous,  Once, 1 of 4 cycles Dose modification: 70 mg/m2 (original dose 70 mg/m2, Cycle 1, Reason: Provider Judgment) Administration: 150 mg (03/22/2019), 150 mg (03/23/2019)  for chemotherapy treatment.    05/18/2019 - 06/28/2019 Chemotherapy   The patient had DOXOrubicin (ADRIAMYCIN) chemo injection 82 mg, 40 mg/m2 = 82 mg (100 % of original dose 40 mg/m2), Intravenous,  Once, 2 of 6 cycles Dose modification: 40 mg/m2 (original dose 40 mg/m2, Cycle 1, Reason: Provider Judgment) Administration: 82 mg (05/18/2019), 82 mg (06/08/2019) palonosetron (ALOXI) injection 0.25 mg, 0.25 mg, Intravenous,  Once, 2 of 6 cycles Administration: 0.25 mg (05/18/2019), 0.25 mg (06/08/2019) pegfilgrastim (NEULASTA ONPRO KIT) injection 6 mg, 6 mg, Subcutaneous, Once, 2 of 6 cycles Administration: 6 mg (05/18/2019), 6 mg (06/08/2019) vinCRIStine (ONCOVIN) 1 mg in sodium chloride 0.9 % 50 mL chemo infusion, 1 mg (100 % of original dose 1 mg), Intravenous,  Once, 2 of 6 cycles Dose modification: 1 mg (original dose 1 mg, Cycle 1, Reason: Provider Judgment) Administration: 1 mg (05/18/2019), 1 mg (06/08/2019) riTUXimab (RITUXAN) 800 mg in  sodium chloride 0.9 % 250 mL (2.4242 mg/mL) infusion, 375 mg/m2 = 800 mg, Intravenous,  Once, 2 of 6 cycles Administration: 800 mg (05/18/2019), 800 mg (06/08/2019) cyclophosphamide (CYTOXAN) 1,240 mg in sodium chloride 0.9 % 250 mL chemo infusion, 600 mg/m2 = 1,540 mg, Intravenous,  Once, 2 of 6 cycles Dose modification: 600 mg/m2 (original dose 750 mg/m2, Cycle 2, Reason: Provider Judgment) Administration: 1,240 mg (05/18/2019), 1,240 mg (06/08/2019) fosaprepitant (EMEND) 150 mg, dexamethasone (DECADRON) 12 mg in sodium chloride 0.9 % 145 mL IVPB, , Intravenous,  Once, 2 of 6 cycles Administration:  (05/18/2019),  (06/08/2019)  for chemotherapy treatment.    07/19/2019 -  Chemotherapy   The patient had riTUXimab (RITUXAN) 700 mg in sodium chloride 0.9 % 250 mL (2.1875 mg/mL) infusion, 375 mg/m2 = 700 mg, Intravenous,  Once, 11 of 11 cycles Administration: 700 mg (07/19/2019), 700 mg (08/16/2019), 700 mg (09/13/2019), 700 mg (10/10/2019), 700 mg (11/07/2019), 700 mg (12/07/2019), 700 mg (01/04/2020), 700 mg (02/01/2020), 700 mg (03/05/2020), 700 mg (04/02/2020), 700 mg (04/30/2020) riTUXimab-pvvr (RUXIENCE) 700 mg in sodium chloride 0.9 % 250 mL (2.1875 mg/mL) infusion, 375 mg/m2 = 700 mg (original dose ), Intravenous,  Once, 0 of 1 cycle Dose modification: 375 mg/m2 (Cycle 12)  for chemotherapy treatment.     PHYSICAL EXAMINATION:  Vitals:   06/24/20 2116 06/25/20 0454  BP: (!) 148/61 (!) 151/60  Pulse: 78 69  Resp: 18 18  Temp: 97.7 F (36.5 C) 98.1 F (36.7 C)  SpO2: 94% 94%   Filed Weights   06/23/20 1206  Weight: 174 lb (78.9 kg)    Intake/Output from  previous day: 07/06 0701 - 07/07 0700 In: 2348.5 [P.O.:900; I.V.:1198.5; IV Piggyback:250] Out: 1300 [Urine:1300]  GENERAL:alert, no distress and comfortable HEENT: No thrush LYMPH: Soft mobile 1-2 cm cervical and supraclavicular nodes on the left LUNGS: clear to auscultation and percussion with normal breathing effort, decreased breath  sounds at the left lower posterior chest HEART: regular rate & rhythm and no murmurs and no lower extremity edema ABDOMEN:abdomen soft, nontender, no mass NEURO: alert & oriented x 3 with fluent speech, no focal motor/sensory deficits  LABORATORY DATA:  I have reviewed the data as listed CMP Latest Ref Rng & Units 06/24/2020 06/23/2020 05/28/2020  Glucose 70 - 99 mg/dL 105(H) 105(H) 95  BUN 8 - 23 mg/dL _0 Creatinine 0.44 - 1.00 mg/dL 0.95 1.06(H) 1.12(H)  Sodium 135 - 145 mmol/L 138 141 143  Potassium 3.5 - 5.1 mmol/L 4.3 5.1 4.5  Chloride 98 - 111 mmol/L 103 104 108  CO2 22 - 32 mmol/L _1 Calcium 8.9 - 10.3 mg/dL 8.5(L) 9.1 9.0  Total Protein 6.5 - 8.1 g/dL 6.0(L) 6.5 -  Total Bilirubin 0.3 - 1.2 mg/dL 1.0 1.0 -  Alkaline Phos 38 - 126 U/L 90 98 -  AST 15 - 41 U/L 29 31 -  ALT 0 - 44 U/L 9 11 -    Lab Results  Component Value Date   WBC 6.3 06/24/2020   HGB 11.4 (L) 06/24/2020   HCT 34.7 (L) 06/24/2020   MCV 104.2 (H) 06/24/2020   PLT 121 (L) 06/24/2020   NEUTROABS 2.8 06/23/2020    CT Abdomen Pelvis W Contrast  Result Date: 06/23/2020 CLINICAL DATA:  86 year old female with abdominal pain, nausea and vomiting. History of lymphoma. EXAM: CT ABDOMEN AND PELVIS WITH CONTRAST TECHNIQUE: Multidetector CT imaging of the abdomen and pelvis was performed using the standard protocol following bolus administration of intravenous contrast. CONTRAST:  34m OMNIPAQUE IOHEXOL 300 MG/ML  SOLN COMPARISON:  CT of the chest abdomen pelvis dated 05/22/2020. FINDINGS: Lower chest: Partially visualized moderate left and trace right pleural effusions, new since the prior CT. There is associated partial compressive atelectasis of the left lower lobe. Pneumonia is not excluded. Clinical correlation is recommended. No intra-abdominal free air. Interval development of a small ascites, new or significantly increased since the prior CT. Hepatobiliary: A 1 cm hypodense focus along the posterior liver  capsule is not characterized but may represent a cyst or focal area of scarring. This is similar to prior CT. Subcentimeter hypodense focus in the anterior liver is too small to characterize. There is mild intrahepatic biliary ductal dilatation, likely post cholecystectomy. The common bile duct is dilated measuring 14 mm. No retained calcified stone noted in the central CBD. Pancreas: The pancreas is unremarkable as visualized. Spleen: Mildly enlarged spleen measuring 15 cm in craniocaudal length. Adrenals/Urinary Tract: The adrenal glands are poorly visualized but grossly unremarkable. There is mild fullness of the renal collecting systems bilaterally without frank hydronephrosis. Mild hazy appearance of the urothelium noted. Correlation with urinalysis recommended to exclude UTI. There is symmetric enhancement and excretion of contrast by both kidneys. Subcentimeter left renal hypodense focus is not characterized. The urinary bladder is partially distended. Probable chronic bladder wall thickening and perivesical stranding. Correlation with urinalysis recommended to exclude UTI. Stomach/Bowel: There is sigmoid diverticulosis without definite active inflammatory changes. There is postsurgical changes of bowel with anastomotic suture in the right lower quadrant. No definite evidence of bowel obstruction. Evaluation however is limited in the  absence of oral contrast. Vascular/Lymphatic: There is moderate aortoiliac atherosclerotic disease. The IVC is unremarkable as visualized. The SMV, splenic vein, and main portal vein are patent. No portal venous gas. Bulky retroperitoneal adenopathy encasing the abdominal aorta and IVC as well as enlarged bilateral iliac chain lymph nodes in keeping with history of lymphoma. Overall the degree of adenopathy is relatively similar to prior CT. Reproductive: The uterus is poorly visualized. Other: Diffuse mesenteric edema, new since the prior CT. There is scattered mesenteric  adenopathy as seen previously. Musculoskeletal: Degenerative changes of the spine. Minimal compression fracture of the anterior superior and inferior endplates of the L3, new since the prior CT, likely acute or subacute. IMPRESSION: 1. Interval development of partially visualized moderate left and trace right pleural effusions, as well as development of diffuse mesenteric edema and ascites, new since the prior CT. 2. Bulky retroperitoneal and iliac chain adenopathy in keeping with history of lymphoma. Overall the degree of adenopathy is relatively similar to prior CT. 3. Mild splenomegaly. 4. Minimal compression fracture of the superior and inferior endplate of L3, likely acute or subacute. 5. Sigmoid diverticulosis. 6. Aortic Atherosclerosis (ICD10-I70.0). Electronically Signed   By: Anner Crete M.D.   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 Status post weekly Rituxan x4 03/01/2012 through 03/22/2012. She completed 4 "maintenance" doses of Rituxan, last on 12/19/2012. A restaging CT on 02/09/2013 showed no evidence of lymphoma.   Lymph node lateral to the thyroid bed on a neck ultrasound 02/21/2014, status post an FNA biopsy concerning for a lymphoproliferative disorder.  PET scan 09/28/2016 with active lymphoma within the neck, chest, abdomen, pelvis; splenic enlargement and hypermetabolism suspicious for splenic involvement.  Initiation of Rituxan weekly 4 09/29/2016  Initiation of maintenance Rituxan on a 3 month schedule 12/23/2016; final Rituxan 08/31/2018  Thyroid ultrasound 02/07/2019-left cervical lymphadenopathy  PET scan 03/08/2019-extensive recurrent hypermetabolic lymphoma involving the neck, chest, abdomen and pelvis.  03/16/2019 left cervical lymph node biopsy-features consistent with previously diagnosed non-Hodgkin's  B-cell lymphoma, phenotypically consistent with marginal zone lymphoma. Flow cytometry with lambda restricted B-cell population without expression of CD5 or CD10 comprising 87% of all lymphocytes.  Cycle 1 bendamustine/Rituxan 03/22/2019  Excision deep left axillary lymph nodes 05/04/2019-non-Hodgkin's B-cell lymphoma with differential including a marginal zone lymphoma and atypical small lymphocytic lymphoma. Flow cytometry with monoclonal B-cell population without expression of CD5 or CD10, comprises 96% of all lymphocytes.  Cycle 1 [facility]/Rituxan 05/18/2019  Cycle 2 [facility]/Rituxan 06/08/2019  CTs 06/15/2019-partial improvement in diffuse adenopathy, stable mild splenomegaly  Cycle 1 Revlimid/rituximab 07/19/2019 (Revlimid start 07/20/2019)  Cycle 2 Revlimid/rituximab 08/16/2019 (Revlimid placed on hold 08/30/2019 due to neutropenia)  Cycle 3 of Revlimid/rituximab 09/13/2019 (Revlimid schedule changed to 14 days on/14 days off)  Cycle 4 Revlimid/rituximab 10/10/2019  Cycle 5 Revlimid/rituximab 11/07/2019  Cycle 6 Revlimid/rituximab 12/07/2019  CTs 12/28/2019-diffuse lymphadenopathy-slightly increased compared to 06/15/2019  Cycle 7 Revlimid/rituximab 01/04/2020  Cycle 8 Revlimid/Rituxan 02/01/2020  Cycle 9 Revlimid/Rituxan 03/05/2020  Cycle 10 Revlimid/Rituxan 04/02/2020  Cycle 11 Revlimid/Rituxan 04/30/2020  CT 05/22/2020-mild increase in left supraclavicular adenopathy, mild increase in chest, retroperitoneal, and pelvic adenopathy. Stable mild splenomegaly.  Acalabrutinib started 06/19/2020  CT abdomen/pelvis 06/23/2020-moderate left and trace right pleural effusions, new diffuse mesenteric edema and ascites, stable retroperitoneal and iliac adenopathy, mild splenomegaly 2. Stage IV (T1bN1b M0) papillary thyroid cancer, status post a thyroidectomy with reimplantation of the left superior  parathyroid gland on 05/23/2012, status post radioactive iodine therapy, followed by Dr. Buddy Duty.  3. Stage  II (T3 N0) colon cancer, status post a right colectomy 10/19/2011, last colonoscopy April 2015-sigmoid adenoma removed.  4. History of a pulmonary embolism December 2012.  5. History of Atrial fibrillation 6. Iron deficiency anemia-new 03/18/2014. Hemoccult positive stool. The anemia corrected with iron. No longer taking iron.  Status post an upper endoscopy and colonoscopy by Dr. Carlean Purl April 2015 with no bleeding source identified, benign adenoma removed from the sigmoid colon. 7. Report of an upper gastric intestinal bleed fall 2016-managed in [place]. [name] 8. Left knee replacement May 2017, repeat left knee surgery May 2018 9. Pruritic rash 07/22/2016 10. Nausea and diarrhea 04/02/2019-stool negative for C. difficile toxin 11. 06/18/2019 [facility] admission for symptomatic anemia. 12.  Admission 06/23/2020 with nausea and diarrhea  Ms. Mcmeans appears partially improved.  She continues to have nausea, but wants to eat today.  Diarrhea has improved.  The etiology of her symptoms remains unclear.  I suspect her symptoms are related to lymphoma as opposed to toxicity from the acalabrutinib.  We will hold the acalabrutinib 1 more day and consider resuming tomorrow.  I will check her LDH. She remains on antibiotics for an E. coli urinary tract infection.  Recommendations: 1.  Hold at acalabrutinib today 2.  Advance diet as tolerated 3.  Recommend as needed Imodium and antiemetics. 4.  Continue antibiotics for treatment of the urinary tract infection, follow-up cultures 5.  Physical therapy evaluation   LOS: 1 day   Betsy Coder, MD 06/25/20

## 2020-06-26 ENCOUNTER — Inpatient Hospital Stay (HOSPITAL_COMMUNITY): Payer: Medicare PPO

## 2020-06-26 DIAGNOSIS — C8510 Unspecified B-cell lymphoma, unspecified site: Secondary | ICD-10-CM

## 2020-06-26 LAB — CBC
HCT: 35.6 % — ABNORMAL LOW (ref 36.0–46.0)
Hemoglobin: 12 g/dL (ref 12.0–15.0)
MCH: 34.8 pg — ABNORMAL HIGH (ref 26.0–34.0)
MCHC: 33.7 g/dL (ref 30.0–36.0)
MCV: 103.2 fL — ABNORMAL HIGH (ref 80.0–100.0)
Platelets: 143 10*3/uL — ABNORMAL LOW (ref 150–400)
RBC: 3.45 MIL/uL — ABNORMAL LOW (ref 3.87–5.11)
RDW: 14.4 % (ref 11.5–15.5)
WBC: 6.3 10*3/uL (ref 4.0–10.5)
nRBC: 0 % (ref 0.0–0.2)

## 2020-06-26 LAB — URINE CULTURE: Culture: >=100000 — AB

## 2020-06-26 LAB — COMPREHENSIVE METABOLIC PANEL
ALT: 20 U/L (ref 0–44)
AST: 49 U/L — ABNORMAL HIGH (ref 15–41)
Albumin: 3.2 g/dL — ABNORMAL LOW (ref 3.5–5.0)
Alkaline Phosphatase: 133 U/L — ABNORMAL HIGH (ref 38–126)
Anion gap: 12 (ref 5–15)
BUN: 11 mg/dL (ref 8–23)
CO2: 25 mmol/L (ref 22–32)
Calcium: 8.1 mg/dL — ABNORMAL LOW (ref 8.9–10.3)
Chloride: 99 mmol/L (ref 98–111)
Creatinine, Ser: 0.84 mg/dL (ref 0.44–1.00)
GFR calc Af Amer: >60 mL/min (ref >60)
GFR calc non Af Amer: >60 mL/min (ref >60)
Glucose, Bld: 98 mg/dL (ref 70–99)
Potassium: 3.8 mmol/L (ref 3.5–5.1)
Sodium: 136 mmol/L (ref 135–145)
Total Bilirubin: 1.1 mg/dL (ref 0.3–1.2)
Total Protein: 5.8 g/dL — ABNORMAL LOW (ref 6.5–8.1)

## 2020-06-26 MED ORDER — ACALABRUTINIB 100 MG PO CAPS
100.0000 mg | ORAL_CAPSULE | Freq: Every day | ORAL | Status: DC
  Administered 2020-06-26: 100 mg via ORAL
  Filled 2020-06-26 (×2): qty 1

## 2020-06-26 MED ORDER — NON FORMULARY: Freq: Every day | Status: DC

## 2020-06-26 MED ORDER — ALPRAZOLAM 0.25 MG PO TABS
0.2500 mg | ORAL_TABLET | Freq: Two times a day (BID) | ORAL | Status: DC | PRN
  Administered 2020-06-26 – 2020-07-13 (×17): 0.25 mg via ORAL
  Filled 2020-06-26 (×17): qty 1

## 2020-06-26 MED ORDER — TRAZODONE HCL 50 MG PO TABS
25.0000 mg | ORAL_TABLET | Freq: Every evening | ORAL | Status: DC | PRN
  Administered 2020-06-26 – 2020-07-01 (×4): 25 mg via ORAL
  Filled 2020-06-26 (×4): qty 1

## 2020-06-26 NOTE — Progress Notes (Addendum)
HEMATOLOGY-ONCOLOGY PROGRESS NOTE  SUBJECTIVE: Diarrhea has resolved. Has ongoing nausea, but no vomiting. Reports dyspnea with exertion.   Oncology History  Non Hodgkin's lymphoma ([place])  09/29/2016 Initial Diagnosis   Non Hodgkin's lymphoma ([place])   03/22/2019 - 04/23/2019 Chemotherapy   The patient had palonosetron (ALOXI) injection 0.25 mg, 0.25 mg, Intravenous,  Once, 1 of 4 cycles Administration: 0.25 mg (03/22/2019) riTUXimab (RITUXAN) 800 mg in sodium chloride 0.9 % 250 mL (2.4242 mg/mL) infusion, 375 mg/m2 = 800 mg, Intravenous,  Once, 1 of 4 cycles Administration: 800 mg (03/22/2019) bendamustine (BENDEKA) 150 mg in sodium chloride 0.9 % 50 mL (2.6786 mg/mL) chemo infusion, 70 mg/m2 = 150 mg (100 % of original dose 70 mg/m2), Intravenous,  Once, 1 of 4 cycles Dose modification: 70 mg/m2 (original dose 70 mg/m2, Cycle 1, Reason: Provider Judgment) Administration: 150 mg (03/22/2019), 150 mg (03/23/2019)  for chemotherapy treatment.    05/18/2019 - 06/28/2019 Chemotherapy   The patient had DOXOrubicin (ADRIAMYCIN) chemo injection 82 mg, 40 mg/m2 = 82 mg (100 % of original dose 40 mg/m2), Intravenous,  Once, 2 of 6 cycles Dose modification: 40 mg/m2 (original dose 40 mg/m2, Cycle 1, Reason: Provider Judgment) Administration: 82 mg (05/18/2019), 82 mg (06/08/2019) palonosetron (ALOXI) injection 0.25 mg, 0.25 mg, Intravenous,  Once, 2 of 6 cycles Administration: 0.25 mg (05/18/2019), 0.25 mg (06/08/2019) pegfilgrastim (NEULASTA ONPRO KIT) injection 6 mg, 6 mg, Subcutaneous, Once, 2 of 6 cycles Administration: 6 mg (05/18/2019), 6 mg (06/08/2019) vinCRIStine (ONCOVIN) 1 mg in sodium chloride 0.9 % 50 mL chemo infusion, 1 mg (100 % of original dose 1 mg), Intravenous,  Once, 2 of 6 cycles Dose modification: 1 mg (original dose 1 mg, Cycle 1, Reason: Provider Judgment) Administration: 1 mg (05/18/2019), 1 mg (06/08/2019) riTUXimab (RITUXAN) 800 mg in sodium chloride 0.9 % 250 mL (2.4242 mg/mL) infusion, 375  mg/m2 = 800 mg, Intravenous,  Once, 2 of 6 cycles Administration: 800 mg (05/18/2019), 800 mg (06/08/2019) cyclophosphamide (CYTOXAN) 1,240 mg in sodium chloride 0.9 % 250 mL chemo infusion, 600 mg/m2 = 1,540 mg, Intravenous,  Once, 2 of 6 cycles Dose modification: 600 mg/m2 (original dose 750 mg/m2, Cycle 2, Reason: Provider Judgment) Administration: 1,240 mg (05/18/2019), 1,240 mg (06/08/2019) fosaprepitant (EMEND) 150 mg, dexamethasone (DECADRON) 12 mg in sodium chloride 0.9 % 145 mL IVPB, , Intravenous,  Once, 2 of 6 cycles Administration:  (05/18/2019),  (06/08/2019)  for chemotherapy treatment.    07/19/2019 -  Chemotherapy   The patient had riTUXimab (RITUXAN) 700 mg in sodium chloride 0.9 % 250 mL (2.1875 mg/mL) infusion, 375 mg/m2 = 700 mg, Intravenous,  Once, 11 of 11 cycles Administration: 700 mg (07/19/2019), 700 mg (08/16/2019), 700 mg (09/13/2019), 700 mg (10/10/2019), 700 mg (11/07/2019), 700 mg (12/07/2019), 700 mg (01/04/2020), 700 mg (02/01/2020), 700 mg (03/05/2020), 700 mg (04/02/2020), 700 mg (04/30/2020) riTUXimab-pvvr (RUXIENCE) 700 mg in sodium chloride 0.9 % 250 mL (2.1875 mg/mL) infusion, 375 mg/m2 = 700 mg (original dose ), Intravenous,  Once, 0 of 1 cycle Dose modification: 375 mg/m2 (Cycle 12)  for chemotherapy treatment.     PHYSICAL EXAMINATION:  Vitals:   06/25/20 2054 06/26/20 0559  BP: (!) 148/56 (!) 146/56  Pulse: 69 67  Resp: 18 18  Temp: 98.8 F (37.1 C) 98.7 F (37.1 C)  SpO2: 95% (!) 89%   Filed Weights   06/23/20 1206  Weight: 78.9 kg    Intake/Output from previous day: 07/07 0701 - 07/08 0700 In: 1414.2 [P.O.:840; I.V.:374.1; IV Piggyback:200.1] Out:  [place], no distress and comfortable HEENT: No thrush LYMPH: Soft mobile 1-2 cm cervical and supraclavicular nodes on the left LUNGS: clear to auscultation and percussion with normal breathing effort, decreased breath sounds at the left lower posterior chest HEART: regular rate &  rhythm and no murmurs and no lower extremity edema ABDOMEN:abdomen soft, nontender, no mass NEURO: alert & oriented x 3 with fluent speech, no focal motor/sensory deficits  LABORATORY DATA:  I have reviewed the data as listed CMP Latest Ref Rng & Units 06/26/2020 06/24/2020 06/23/2020  Glucose 70 - 99 mg/dL 98 105(H) 105(H)  BUN 8 - 23 mg/dL '11 15 18  ' Creatinine 0.44 - 1.00 mg/dL 0.84 0.95 1.06(H)  Sodium 135 - 145 mmol/L 136 138 141  Potassium 3.5 - 5.1 mmol/L 3.8 4.3 5.1  Chloride 98 - 111 mmol/L 99 103 104  CO2 22 - 32 mmol/L '25 26 25  ' Calcium 8.9 - 10.3 mg/dL 8.1(L) 8.5(L) 9.1  Total Protein 6.5 - 8.1 g/dL 5.8(L) 6.0(L) 6.5  Total Bilirubin 0.3 - 1.2 mg/dL 1.1 1.0 1.0  Alkaline Phos 38 - 126 U/L 133(H) 90 98  AST 15 - 41 U/L 49(H) 29 31  ALT 0 - 44 U/L '20 9 11    ' Lab Results  Component Value Date   WBC 6.3 06/26/2020   HGB 12.0 06/26/2020   HCT 35.6 (L) 06/26/2020   MCV 103.2 (H) 06/26/2020   PLT 143 (L) 06/26/2020   NEUTROABS 2.8 06/23/2020    CT Abdomen Pelvis W Contrast  Result Date: 06/23/2020 CLINICAL DATA:  86 year old female with abdominal pain, nausea and vomiting. History of lymphoma. EXAM: CT ABDOMEN AND PELVIS WITH CONTRAST TECHNIQUE: Multidetector CT imaging of the abdomen and pelvis was performed using the standard protocol following bolus administration of intravenous contrast. CONTRAST:  74m OMNIPAQUE IOHEXOL 300 MG/ML  SOLN COMPARISON:  CT of the chest abdomen pelvis dated 05/22/2020. FINDINGS: Lower chest: Partially visualized moderate left and trace right pleural effusions, new since the prior CT. There is associated partial compressive atelectasis of the left lower lobe. Pneumonia is not excluded. Clinical correlation is recommended. No intra-abdominal free air. Interval development of a small ascites, new or significantly increased since the prior CT. Hepatobiliary: A 1 cm hypodense focus along the posterior liver capsule is not characterized but may represent a  cyst or focal area of scarring. This is similar to prior CT. Subcentimeter hypodense focus in the anterior liver is too small to characterize. There is mild intrahepatic biliary ductal dilatation, likely post cholecystectomy. The common bile duct is dilated measuring 14 mm. No retained calcified stone noted in the central CBD. Pancreas: The pancreas is unremarkable as visualized. Spleen: Mildly enlarged spleen measuring 15 cm in craniocaudal length. Adrenals/Urinary Tract: The adrenal glands are poorly visualized but grossly unremarkable. There is mild fullness of the renal collecting systems bilaterally without frank hydronephrosis. Mild hazy appearance of the urothelium noted. Correlation with urinalysis recommended to exclude UTI. There is symmetric enhancement and excretion of contrast by both kidneys. Subcentimeter left renal hypodense focus is not characterized. The urinary bladder is partially distended. Probable chronic bladder wall thickening and perivesical stranding. Correlation with urinalysis recommended to exclude UTI. Stomach/Bowel: There is sigmoid diverticulosis without definite active inflammatory changes. There is postsurgical changes of bowel with anastomotic suture in the right lower quadrant. No definite evidence of bowel obstruction. Evaluation however is limited in the absence of oral contrast. Vascular/Lymphatic: There is moderate aortoiliac atherosclerotic disease. The IVC is unremarkable  as visualized. The SMV, splenic vein, and main portal vein are patent. No portal venous gas. Bulky retroperitoneal adenopathy encasing the abdominal aorta and IVC as well as enlarged bilateral iliac chain lymph nodes in keeping with history of lymphoma. Overall the degree of adenopathy is relatively similar to prior CT. Reproductive: The uterus is poorly visualized. Other: Diffuse mesenteric edema, new since the prior CT. There is scattered mesenteric adenopathy as seen previously. Musculoskeletal:  Degenerative changes of the spine. Minimal compression fracture of the anterior superior and inferior endplates of the L3, new since the prior CT, likely acute or subacute. IMPRESSION: 1. Interval development of partially visualized moderate left and trace right pleural effusions, as well as development of diffuse mesenteric edema and ascites, new since the prior CT. 2. Bulky retroperitoneal and iliac chain adenopathy in keeping with history of lymphoma. Overall the degree of adenopathy is relatively similar to prior CT. 3. Mild splenomegaly. 4. Minimal compression fracture of the superior and inferior endplate of L3, likely acute or subacute. 5. Sigmoid diverticulosis. 6. Aortic Atherosclerosis (ICD10-I70.0). Electronically Signed   By: Anner Crete M.D.   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 Status post weekly Rituxan x4 03/01/2012 through 03/22/2012. She completed 4 "maintenance" doses of Rituxan, last on 12/19/2012. A restaging CT on 02/09/2013 showed no evidence of lymphoma.   Lymph node lateral to the thyroid bed on a neck ultrasound 02/21/2014, status post an FNA biopsy concerning for a lymphoproliferative disorder.  PET scan 09/28/2016 with active lymphoma within the neck, chest, abdomen, pelvis; splenic enlargement and hypermetabolism suspicious for splenic involvement.  Initiation of Rituxan weekly 4 09/29/2016  Initiation of maintenance Rituxan on a 3 month schedule 12/23/2016; final Rituxan 08/31/2018  Thyroid ultrasound 02/07/2019-left cervical lymphadenopathy  PET scan 03/08/2019-extensive recurrent hypermetabolic lymphoma involving the neck, chest, abdomen and pelvis.  03/16/2019 left cervical lymph node biopsy-features consistent with previously diagnosed non-Hodgkin's B-cell lymphoma, phenotypically consistent with  marginal zone lymphoma. Flow cytometry with lambda restricted B-cell population without expression of CD5 or CD10 comprising 87% of all lymphocytes.  Cycle 1 bendamustine/Rituxan 03/22/2019  Excision deep left axillary lymph nodes 05/04/2019-non-Hodgkin's B-cell lymphoma with differential including a marginal zone lymphoma and atypical small lymphocytic lymphoma. Flow cytometry with monoclonal B-cell population without expression of CD5 or CD10, comprises 96% of all lymphocytes.  Cycle 1 [facility]/Rituxan 05/18/2019  Cycle 2 [facility]/Rituxan 06/08/2019  CTs 06/15/2019-partial improvement in diffuse adenopathy, stable mild splenomegaly  Cycle 1 Revlimid/rituximab 07/19/2019 (Revlimid start 07/20/2019)  Cycle 2 Revlimid/rituximab 08/16/2019 (Revlimid placed on hold 08/30/2019 due to neutropenia)  Cycle 3 of Revlimid/rituximab 09/13/2019 (Revlimid schedule changed to 14 days on/14 days off)  Cycle 4 Revlimid/rituximab 10/10/2019  Cycle 5 Revlimid/rituximab 11/07/2019  Cycle 6 Revlimid/rituximab 12/07/2019  CTs 12/28/2019-diffuse lymphadenopathy-slightly increased compared to 06/15/2019  Cycle 7 Revlimid/rituximab 01/04/2020  Cycle 8 Revlimid/Rituxan 02/01/2020  Cycle 9 Revlimid/Rituxan 03/05/2020  Cycle 10 Revlimid/Rituxan 04/02/2020  Cycle 11 Revlimid/Rituxan 04/30/2020  CT 05/22/2020-mild increase in left supraclavicular adenopathy, mild increase in chest, retroperitoneal, and pelvic adenopathy. Stable mild splenomegaly.  Acalabrutinib started 06/19/2020  CT abdomen/pelvis 06/23/2020-moderate left and trace right pleural effusions, new diffuse mesenteric edema and ascites, stable retroperitoneal and iliac adenopathy, mild splenomegaly 2. Stage IV (T1bN1b M0) papillary thyroid cancer, status post a thyroidectomy with reimplantation of the left superior parathyroid gland on 05/23/2012, status post radioactive iodine therapy, followed by Dr. Buddy Duty.  3.  Stage II (T3 N0) colon cancer, status post a right  colectomy 10/19/2011, last colonoscopy April 2015-sigmoid adenoma removed.  4. History of a pulmonary embolism December 2012.  5. History of Atrial fibrillation 6. Iron deficiency anemia-new 03/18/2014. Hemoccult positive stool. The anemia corrected with iron. No longer taking iron.  Status post an upper endoscopy and colonoscopy by Dr. Carlean Purl April 2015 with no bleeding source identified, benign adenoma removed from the sigmoid colon. 7. Report of an upper gastric intestinal bleed fall 2016-managed in [place]. [name] 8. Left knee replacement May 2017, repeat left knee surgery May 2018 9. Pruritic rash 07/22/2016 10. Nausea and diarrhea 04/02/2019-stool negative for C. difficile toxin 11. 06/18/2019 [facility] admission for symptomatic anemia. 12.  Admission 06/23/2020 with nausea and diarrhea  Ms. Rochin appears partially improved.  She continues to have nausea, but diarrhea has resolved.  The etiology of her symptoms remains unclear.  I suspect her symptoms are related to lymphoma as opposed to toxicity from the acalabrutinib.  Will restart acalabrutinib.   She has dyspnea and will check CXR. recommend thoracentesis if pleural effusion on CXR. Remains on antibiotics for UTI.   Recommendations: 1.  Restart acalabrutinib today 2.  Recommend as needed antiemetics. 3.  Continue antibiotics for treatment of the urinary tract infection 4. CXR today due to dyspnea.    LOS: 2 days   Mikey Bussing 06/26/20 Ms. Wieand was interviewed and examined.  We will check a chest x-ray to see if she has a significant pleural effusion.  The LDH is lower and the palpable lymph nodes are smaller after only a few days of acalabrutinib.  The plan is to repeat start out acalabrutinib today.  Julieanne Manson, MD

## 2020-06-26 NOTE — Progress Notes (Signed)
PROGRESS NOTE    Tracy Mclaughlin  QJJ:[id number] DOB: [birth year] DOA: 06/23/2020 PCP: [name], PA   Chef Complaints: Nuaea vomiting  Brief Narrative:  86 y.o. 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 [name], hypothyroidism, history of A. fib, pancytopenia presents to the ED with 3-4 day history of nausea vomiting and diarrhea and abdominal pain-and right upper quadrant. symptomts started after new chemo on Thursday.Patient denies any fever chest pain. vomitted x3 today and had non bloody diarrhea 4 times today.  ED Course: Hemodynamically stable, afebrile, UA positive for UTI,CT scan abdomen done that showed free fluid with ascites and mesenteric edema, case was discussed Dr. [name] from oncology advised UTI treatment with Zosyn and he will see the patient in the morning.  Subjective:  Feels short of breath, nauseus and forced herself to breakfast and stomach upset now feels anxious also.  Assessment & Plan:  Intractable nausea vomiting diarrhea and abdominal pain :   Diarrhea has resolved.unclear etiology whether from chemo.  Oncology input noted and plans to restart acalabrutinib.  Continue pain control, supportive care , anxiety meds, nausea medication.    Shortness of breath- getting cxr today.  If she has pleural effusion may need to consider thoracentesis.  60 unremarkable ?  Consider ascitic tap.  Ascites new onset suspect from lymphoma. Op f/u , monitor. No plan for tapping  E coli UTI GYJ:EHUDJSH immunosuppressed continue Zosyn until c/s back.   Non Hodgkin's lymphoma-with progressive decline, patient was started past week on Acalabrutinib she did not have significant response to chemotherapy last year.  Resume her chemo today per oncology,LDH imprving 456 ( 4 wk ago) > 285 06/24/20  Hypertension: BP is controlled on Cardizem.     Pancytopenia anemia mild thrombocytopenia in the setting of lymphoma. CBC overall stable.  Monitor  History of intermittent atrial fibrillation.   Continue her home Cardizem.  Not on anticoagulation defer to her outpatient cardiologist.    DVT prophylaxis: heparin injection 5,000 Units Start: 06/23/20 2200 Code Status: DNR Family Communication: plan of care discussed with patient at bedside.  Status is: admitted as Observation  Patient remains symptomatic with nausea, ongoing diarrhea and remains hospitalized for further management, and she has been changed to inpatient status with ongoing symptoms. Monitoring her symptoms after restarting her chemo.   Dispo: The patient is from: Home              Anticipated d/c is to: Home get PT eval               Anticipated d/c date is: 1 day              Patient currently is not medically stable to d/c.  Nutrition: Diet Order            Diet regular Room service appropriate? Yes; Fluid consistency: Thin  Diet effective now                       Body mass index is 26.46 kg/m.  Consultants:see note  Procedures:see note Microbiology:see note  Medications: Scheduled Meds:  Chlorhexidine Gluconate Cloth  6 each Topical Daily   citalopram  40 mg Oral Daily   diltiazem  180 mg Oral QHS   heparin  5,000 Units Subcutaneous Q8H   levothyroxine  224 mcg Oral Q0600   melatonin  3 mg Oral QHS  Continuous Infusions:  piperacillin-tazobactam (ZOSYN)  IV 3.375 g (06/26/20 0830)    Antimicrobials: Anti-infectives (From admission, onward)   Start     Dose/Rate Route Frequency Ordered Stop   06/24/20 0200  piperacillin-tazobactam (ZOSYN) IVPB 3.375 g     Discontinue     3.375 g 12.5 mL/hr over 240 Minutes Intravenous Every 8 hours 06/23/20 1703     06/23/20 1645  piperacillin-tazobactam (ZOSYN) IVPB 3.375 g        3.375 g 100 mL/hr over 30 Minutes Intravenous  Once 06/23/20 1638 06/23/20 1834   06/23/20 1615  cefTRIAXone  (ROCEPHIN) 1 g in sodium chloride 0.9 % 100 mL IVPB  Status:  Discontinued        1 g 200 mL/hr over 30 Minutes Intravenous  Once 06/23/20 1607 06/23/20 1608       Objective: Vitals: Today's Vitals   06/25/20 2209 06/26/20 0559 06/26/20 0739 06/26/20 0833  BP:  (!) 146/56    Pulse:  67    Resp:  18    Temp:  98.7 F (37.1 C)    TempSrc:  Oral    SpO2:  (!) 89%    Weight:      Height:      PainSc: Asleep  5  2     Intake/Output Summary (Last 24 hours) at 06/26/2020 0926 Last data filed at 06/26/2020 0910 Gross per 24 hour  Intake 1422.41 ml  Output 950 ml  Net 472.41 ml   Filed Weights   06/23/20 1206  Weight: 78.9 kg   Weight change:    Intake/Output from previous day: 07/07 0701 - 07/08 0700 In: 1414.2 [P.O.:840; I.V.:374.1; IV Piggyback:200.1] Out: 950 [Urine:950] Intake/Output this shift: Total I/O In: 8.2 [IV Piggyback:8.2] Out: 0   Examination:  General exam: AAOx3, weak frail deconditioned,NAD HEENT:Oral mucosa moist, Ear/Nose WNL grossly, dentition normal. Respiratory system: bilaterally clear,no wheezing or crackles,no use of accessory muscle Cardiovascular system: S1 & S2 +, No JVD,. Gastrointestinal system: Abdomen soft, minimally tender,ND, BS+ Nervous System:Alert, awake, moving extremities and grossly nonfocal Extremities: No edema, distal peripheral pulses palpable.  Skin: No rashes,no icterus. MSK: Normal muscle bulk,tone, power  Data Reviewed: I have personally reviewed following labs and imaging studies CBC: Recent Labs  Lab 06/23/20 1411 06/24/20 0454 06/26/20 0346  WBC 6.5 6.3 6.3  NEUTROABS 2.8  --   --   HGB 12.5 11.4* 12.0  HCT 36.7 34.7* 35.6*  MCV 103.4* 104.2* 103.2*  PLT 142* 121* 749*   Basic Metabolic Panel: Recent Labs  Lab 06/23/20 1411 06/24/20 0454 06/26/20 0346  NA 141 138 136  K 5.1 4.3 3.8  CL 104 103 99  CO2 25 26 25   GLUCOSE 105* 105* 98  BUN 18 15 11   CREATININE 1.06* 0.95 0.84  CALCIUM 9.1 8.5* 8.1*    GFR: Estimated Creatinine Clearance: 56 mL/min (by C-G formula based on SCr of 0.84 mg/dL). Liver Function Tests: Recent Labs  Lab 06/23/20 1411 06/24/20 0454 06/26/20 0346  AST 31 29 49*  ALT 11 9 20   ALKPHOS 98 90 133*  BILITOT 1.0 1.0 1.1  PROT 6.5 6.0* 5.8*  ALBUMIN 3.6 3.3* 3.2*   Recent Labs  Lab 06/23/20 1411  LIPASE 19   No results for input(s): AMMONIA in the last 168 hours. Coagulation Profile: No results for input(s): INR, PROTIME in the last 168 hours. Cardiac Enzymes: No results for input(s): CKTOTAL, CKMB, CKMBINDEX, TROPONINI in the last 168 hours. BNP (last 3 results) No results  for input(s): PROBNP in the last 8760 hours. HbA1C: No results for input(s): HGBA1C in the last 72 hours. CBG: No results for input(s): GLUCAP in the last 168 hours. Lipid Profile: No results for input(s): CHOL, HDL, LDLCALC, TRIG, CHOLHDL, LDLDIRECT in the last 72 hours. Thyroid Function Tests: No results for input(s): TSH, T4TOTAL, FREET4, T3FREE, THYROIDAB in the last 72 hours. Anemia Panel: No results for input(s): VITAMINB12, FOLATE, FERRITIN, TIBC, IRON, RETICCTPCT in the last 72 hours. Sepsis Labs: Recent Labs  Lab 06/23/20 1411  LATICACIDVEN 0.7    Recent Results (from the past 240 hour(s))  Urine culture     Status: Abnormal   Collection Time: 06/23/20  2:11 PM   Specimen: Urine, Random  Result Value Ref Range Status   Specimen Description   Final    URINE, RANDOM Performed at [facility] [address], [place], [place] [address]    Special Requests   Final    NONE Performed at [facility], [place] [address], [place], [place] [address]    Culture >=100,000 COLONIES/mL ESCHERICHIA COLI (A)  Final   Report Status 06/26/2020 FINAL  Final   Organism ID, Bacteria ESCHERICHIA COLI (A)  Final      Susceptibility   Escherichia coli - MIC*    AMPICILLIN >=32 RESISTANT Resistant     CEFAZOLIN <=4 SENSITIVE Sensitive      CEFTRIAXONE <=0.25 SENSITIVE Sensitive     CIPROFLOXACIN <=0.25 SENSITIVE Sensitive     GENTAMICIN <=1 SENSITIVE Sensitive     IMIPENEM <=0.25 SENSITIVE Sensitive     NITROFURANTOIN <=16 SENSITIVE Sensitive     TRIMETH/SULFA <=20 SENSITIVE Sensitive     AMPICILLIN/SULBACTAM >=32 RESISTANT Resistant     PIP/TAZO <=4 SENSITIVE Sensitive     * >=100,000 COLONIES/mL ESCHERICHIA COLI  C Difficile Quick Screen w PCR reflex     Status: None   Collection Time: 06/23/20  4:26 PM   Specimen: Nasopharyngeal Swab; Stool  Result Value Ref Range Status   C Diff antigen NEGATIVE NEGATIVE Final   C Diff toxin NEGATIVE NEGATIVE Final   C Diff interpretation No C. difficile detected.  Final    Comment: Performed at [facility], [place] [address], [place], [place] [address]  Blood culture (routine x 2)     Status: None (Preliminary result)   Collection Time: 06/23/20  5:58 PM   Specimen: BLOOD  Result Value Ref Range Status   Specimen Description   Final    BLOOD SITE NOT SPECIFIED Performed at [facility] [address] [place], [place] [address]    Special Requests   Final    BOTTLES DRAWN AEROBIC AND ANAEROBIC Blood Culture adequate volume Performed at [facility] [address] [place], [place] [address]    Culture   Final    NO GROWTH 2 DAYS Performed at [facility] [address], [place], [place] [address]    Report Status PENDING  Incomplete  Blood culture (routine x 2)     Status: None (Preliminary result)   Collection Time: 06/23/20  5:58 PM   Specimen: BLOOD  Result Value Ref Range Status   Specimen Description   Final    BLOOD SITE NOT SPECIFIED Performed at [facility] [address] [place], [place] [address]    Special Requests   Final    BOTTLES DRAWN AEROBIC AND ANAEROBIC Blood Culture adequate volume Performed at [facility],  [place] [address] [place], [place] [address]    Culture   Final      NO GROWTH 2 DAYS Performed at [facility] [address], [place], [place] [address]    Report Status PENDING  Incomplete  SARS Coronavirus 2 by RT PCR (hospital order, performed in [facility] hospital lab) Nasopharyngeal Nasopharyngeal Swab     Status: None   Collection Time: 06/23/20  5:58 PM   Specimen: Nasopharyngeal Swab  Result Value Ref Range Status   SARS Coronavirus 2 NEGATIVE NEGATIVE Final    Comment: (NOTE) SARS-CoV-2 target nucleic acids are NOT DETECTED.  The SARS-CoV-2 RNA is generally detectable in upper and lower respiratory specimens during the acute phase of infection. The lowest concentration of SARS-CoV-2 viral copies this assay can detect is 250 copies / mL. A negative result does not preclude SARS-CoV-2 infection and should not be used as the sole basis for treatment or other patient management decisions.  A negative result may occur with improper specimen collection / handling, submission of specimen other than nasopharyngeal swab, presence of viral mutation(s) within the areas targeted by this assay, and inadequate number of viral copies (<250 copies / mL). A negative result must be combined with clinical observations, patient history, and epidemiological information.  Fact Sheet for Patients:   StrictlyIdeas.no  Fact Sheet for Healthcare Providers: BankingDealers.co.za  This test is not yet approved or  cleared by the [place] FDA and has been authorized for detection and/or diagnosis of SARS-CoV-2 by FDA under an Emergency Use Authorization (EUA).  This EUA will remain in effect (meaning this test can be used) for the duration of the COVID-19 declaration under Section 564(b)(1) of the Act, 21 U.S.C. section 360bbb-3(b)(1), unless the authorization is terminated or revoked sooner.  Performed at [facility], [place] [address] [place], [place] [address]       Radiology  Studies: No results found.   LOS: 2 days   Antonieta Pert, MD [facility]  06/26/2020, 9:26 AM

## 2020-06-27 MED ORDER — SODIUM CHLORIDE 0.9 % IV SOLN
1.0000 g | INTRAVENOUS | Status: DC
  Administered 2020-06-27 – 2020-06-30 (×4): 1 g via INTRAVENOUS
  Filled 2020-06-27 (×4): qty 1

## 2020-06-27 MED ORDER — PREDNISONE 5 MG PO TABS
10.0000 mg | ORAL_TABLET | Freq: Every day | ORAL | Status: DC
  Administered 2020-06-28 – 2020-07-01 (×4): 10 mg via ORAL
  Filled 2020-06-27 (×4): qty 2

## 2020-06-27 MED ORDER — ACALABRUTINIB 100 MG PO CAPS
100.0000 mg | ORAL_CAPSULE | Freq: Every day | ORAL | Status: DC
  Administered 2020-06-27 – 2020-07-01 (×5): 100 mg via ORAL

## 2020-06-27 NOTE — Care Management Important Message (Signed)
Important Message  Patient Details IM Letter given to [facility] Case Manager to present to the Patient Name: Tracy Mclaughlin MRN: [id number] Date of Birth: April 14, 1934   Medicare Important Message Given:  Yes     Kerin Salen 06/27/2020, 11:08 AM

## 2020-06-27 NOTE — Progress Notes (Signed)
PT Cancellation Note  Patient Details Name: Tracy Mclaughlin MRN: [id number] DOB: 1934/10/27   Cancelled Treatment:    Reason Eval/Treat Not Completed: Fatigue/lethargy limiting ability to participate. Declines stating she needs to sleep    [facility] 06/27/2020, 10:10 AM

## 2020-06-27 NOTE — Progress Notes (Addendum)
HEMATOLOGY-ONCOLOGY PROGRESS NOTE  SUBJECTIVE: Restarted Calquence yesterday.  No recurrent diarrhea.  Still with persistent nausea but no vomiting.  Still has overall generalized weakness and dyspnea.  Chest x-ray did not show significant pleural effusion.  Oncology History  Non Hodgkin's lymphoma ([place])  09/29/2016 Initial Diagnosis   Non Hodgkin's lymphoma ([place])   03/22/2019 - 04/23/2019 Chemotherapy   The patient had palonosetron (ALOXI) injection 0.25 mg, 0.25 mg, Intravenous,  Once, 1 of 4 cycles Administration: 0.25 mg (03/22/2019) riTUXimab (RITUXAN) 800 mg in sodium chloride 0.9 % 250 mL (2.4242 mg/mL) infusion, 375 mg/m2 = 800 mg, Intravenous,  Once, 1 of 4 cycles Administration: 800 mg (03/22/2019) bendamustine (BENDEKA) 150 mg in sodium chloride 0.9 % 50 mL (2.6786 mg/mL) chemo infusion, 70 mg/m2 = 150 mg (100 % of original dose 70 mg/m2), Intravenous,  Once, 1 of 4 cycles Dose modification: 70 mg/m2 (original dose 70 mg/m2, Cycle 1, Reason: Provider Judgment) Administration: 150 mg (03/22/2019), 150 mg (03/23/2019)  for chemotherapy treatment.    05/18/2019 - 06/28/2019 Chemotherapy   The patient had DOXOrubicin (ADRIAMYCIN) chemo injection 82 mg, 40 mg/m2 = 82 mg (100 % of original dose 40 mg/m2), Intravenous,  Once, 2 of 6 cycles Dose modification: 40 mg/m2 (original dose 40 mg/m2, Cycle 1, Reason: Provider Judgment) Administration: 82 mg (05/18/2019), 82 mg (06/08/2019) palonosetron (ALOXI) injection 0.25 mg, 0.25 mg, Intravenous,  Once, 2 of 6 cycles Administration: 0.25 mg (05/18/2019), 0.25 mg (06/08/2019) pegfilgrastim (NEULASTA ONPRO KIT) injection 6 mg, 6 mg, Subcutaneous, Once, 2 of 6 cycles Administration: 6 mg (05/18/2019), 6 mg (06/08/2019) vinCRIStine (ONCOVIN) 1 mg in sodium chloride 0.9 % 50 mL chemo infusion, 1 mg (100 % of original dose 1 mg), Intravenous,  Once, 2 of 6 cycles Dose modification: 1 mg (original dose 1 mg, Cycle 1, Reason: Provider Judgment) Administration: 1 mg  (05/18/2019), 1 mg (06/08/2019) riTUXimab (RITUXAN) 800 mg in sodium chloride 0.9 % 250 mL (2.4242 mg/mL) infusion, 375 mg/m2 = 800 mg, Intravenous,  Once, 2 of 6 cycles Administration: 800 mg (05/18/2019), 800 mg (06/08/2019) cyclophosphamide (CYTOXAN) 1,240 mg in sodium chloride 0.9 % 250 mL chemo infusion, 600 mg/m2 = 1,540 mg, Intravenous,  Once, 2 of 6 cycles Dose modification: 600 mg/m2 (original dose 750 mg/m2, Cycle 2, Reason: Provider Judgment) Administration: 1,240 mg (05/18/2019), 1,240 mg (06/08/2019) fosaprepitant (EMEND) 150 mg, dexamethasone (DECADRON) 12 mg in sodium chloride 0.9 % 145 mL IVPB, , Intravenous,  Once, 2 of 6 cycles Administration:  (05/18/2019),  (06/08/2019)  for chemotherapy treatment.    07/19/2019 -  Chemotherapy   The patient had riTUXimab (RITUXAN) 700 mg in sodium chloride 0.9 % 250 mL (2.1875 mg/mL) infusion, 375 mg/m2 = 700 mg, Intravenous,  Once, 11 of 11 cycles Administration: 700 mg (07/19/2019), 700 mg (08/16/2019), 700 mg (09/13/2019), 700 mg (10/10/2019), 700 mg (11/07/2019), 700 mg (12/07/2019), 700 mg (01/04/2020), 700 mg (02/01/2020), 700 mg (03/05/2020), 700 mg (04/02/2020), 700 mg (04/30/2020) riTUXimab-pvvr (RUXIENCE) 700 mg in sodium chloride 0.9 % 250 mL (2.1875 mg/mL) infusion, 375 mg/m2 = 700 mg (original dose ), Intravenous,  Once, 0 of 1 cycle Dose modification: 375 mg/m2 (Cycle 12)  for chemotherapy treatment.     PHYSICAL EXAMINATION:  Vitals:   06/26/20 2141 06/27/20 0605  BP: (!) 158/70 (!) 144/60  Pulse: 74 73  Resp: 18 18  Temp: 98 F (36.7 C) 97.8 F (36.6 C)  SpO2: 94% 90%   Filed Weights   06/23/20 1206  Weight: 78.9 kg  Intake/Output from previous day: 07/08 0701 - 07/09 0700 In: 8 [P.O.:780; IV Piggyback:100] Out: 0   GENERAL:alert, no distress and comfortable HEENT: No thrush LYMPH: Soft mobile 1 cm cervical and supraclavicular nodes on the left LUNGS: clear to auscultation and percussion with normal breathing effort,  decreased breath sounds at the left lower posterior chest HEART: regular rate & rhythm and no murmurs and no lower extremity edema ABDOMEN:abdomen soft, nontender, no mass NEURO: alert & oriented x 3 with fluent speech, no focal motor/sensory deficits  LABORATORY DATA:  I have reviewed the data as listed CMP Latest Ref Rng & Units 06/26/2020 06/24/2020 06/23/2020  Glucose 70 - 99 mg/dL 98 105(H) 105(H)  BUN 8 - 23 mg/dL '11 15 18  ' Creatinine 0.44 - 1.00 mg/dL 0.84 0.95 1.06(H)  Sodium 135 - 145 mmol/L 136 138 141  Potassium 3.5 - 5.1 mmol/L 3.8 4.3 5.1  Chloride 98 - 111 mmol/L 99 103 104  CO2 22 - 32 mmol/L '25 26 25  ' Calcium 8.9 - 10.3 mg/dL 8.1(L) 8.5(L) 9.1  Total Protein 6.5 - 8.1 g/dL 5.8(L) 6.0(L) 6.5  Total Bilirubin 0.3 - 1.2 mg/dL 1.1 1.0 1.0  Alkaline Phos 38 - 126 U/L 133(H) 90 98  AST 15 - 41 U/L 49(H) 29 31  ALT 0 - 44 U/L '20 9 11    ' Lab Results  Component Value Date   WBC 6.3 06/26/2020   HGB 12.0 06/26/2020   HCT 35.6 (L) 06/26/2020   MCV 103.2 (H) 06/26/2020   PLT 143 (L) 06/26/2020   NEUTROABS 2.8 06/23/2020    DG Chest 2 View  Result Date: 06/26/2020 CLINICAL DATA:  Dyspnea. EXAM: CHEST - 2 VIEW COMPARISON:  Chest CT 05/22/2020 FINDINGS: Right chest port remains in place. Hazy opacity at the left lung base consistent with pleural effusion and airspace disease/atelectasis. Right lung is clear. Upper normal heart size. Mild right hilar prominence likely secondary to combination of vascular structures and adenopathy is seen on recent CT. Fullness in the upper mediastinum likely secondary to adenopathy. No pneumothorax. No pulmonary edema. Surgical clips in the left axilla. IMPRESSION: 1. Hazy opacity at the left lung base consistent with pleural effusion and airspace disease/atelectasis. 2. Mild right hilar prominence likely secondary to combination of vascular structures and adenopathy seen on recent CT. Electronically Signed   By: Keith Rake M.D.   On: 06/26/2020  15:54   CT Abdomen Pelvis W Contrast  Result Date: 06/23/2020 CLINICAL DATA:  86 year old female with abdominal pain, nausea and vomiting. History of lymphoma. EXAM: CT ABDOMEN AND PELVIS WITH CONTRAST TECHNIQUE: Multidetector CT imaging of the abdomen and pelvis was performed using the standard protocol following bolus administration of intravenous contrast. CONTRAST:  49m OMNIPAQUE IOHEXOL 300 MG/ML  SOLN COMPARISON:  CT of the chest abdomen pelvis dated 05/22/2020. FINDINGS: Lower chest: Partially visualized moderate left and trace right pleural effusions, new since the prior CT. There is associated partial compressive atelectasis of the left lower lobe. Pneumonia is not excluded. Clinical correlation is recommended. No intra-abdominal free air. Interval development of a small ascites, new or significantly increased since the prior CT. Hepatobiliary: A 1 cm hypodense focus along the posterior liver capsule is not characterized but may represent a cyst or focal area of scarring. This is similar to prior CT. Subcentimeter hypodense focus in the anterior liver is too small to characterize. There is mild intrahepatic biliary ductal dilatation, likely post cholecystectomy. The common bile duct is dilated measuring 14 mm. No  retained calcified stone noted in the central CBD. Pancreas: The pancreas is unremarkable as visualized. Spleen: Mildly enlarged spleen measuring 15 cm in craniocaudal length. Adrenals/Urinary Tract: The adrenal glands are poorly visualized but grossly unremarkable. There is mild fullness of the renal collecting systems bilaterally without frank hydronephrosis. Mild hazy appearance of the urothelium noted. Correlation with urinalysis recommended to exclude UTI. There is symmetric enhancement and excretion of contrast by both kidneys. Subcentimeter left renal hypodense focus is not characterized. The urinary bladder is partially distended. Probable chronic bladder wall thickening and perivesical  stranding. Correlation with urinalysis recommended to exclude UTI. Stomach/Bowel: There is sigmoid diverticulosis without definite active inflammatory changes. There is postsurgical changes of bowel with anastomotic suture in the right lower quadrant. No definite evidence of bowel obstruction. Evaluation however is limited in the absence of oral contrast. Vascular/Lymphatic: There is moderate aortoiliac atherosclerotic disease. The IVC is unremarkable as visualized. The SMV, splenic vein, and main portal vein are patent. No portal venous gas. Bulky retroperitoneal adenopathy encasing the abdominal aorta and IVC as well as enlarged bilateral iliac chain lymph nodes in keeping with history of lymphoma. Overall the degree of adenopathy is relatively similar to prior CT. Reproductive: The uterus is poorly visualized. Other: Diffuse mesenteric edema, new since the prior CT. There is scattered mesenteric adenopathy as seen previously. Musculoskeletal: Degenerative changes of the spine. Minimal compression fracture of the anterior superior and inferior endplates of the L3, new since the prior CT, likely acute or subacute. IMPRESSION: 1. Interval development of partially visualized moderate left and trace right pleural effusions, as well as development of diffuse mesenteric edema and ascites, new since the prior CT. 2. Bulky retroperitoneal and iliac chain adenopathy in keeping with history of lymphoma. Overall the degree of adenopathy is relatively similar to prior CT. 3. Mild splenomegaly. 4. Minimal compression fracture of the superior and inferior endplate of L3, likely acute or subacute. 5. Sigmoid diverticulosis. 6. Aortic Atherosclerosis (ICD10-I70.0). Electronically Signed   By: Anner Crete M.D.   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 Status post  weekly Rituxan x4 03/01/2012 through 03/22/2012. She completed 4 "maintenance" doses of Rituxan, last on 12/19/2012. A restaging CT on 02/09/2013 showed no evidence of lymphoma.   Lymph node lateral to the thyroid bed on a neck ultrasound 02/21/2014, status post an FNA biopsy concerning for a lymphoproliferative disorder.  PET scan 09/28/2016 with active lymphoma within the neck, chest, abdomen, pelvis; splenic enlargement and hypermetabolism suspicious for splenic involvement.  Initiation of Rituxan weekly 4 09/29/2016  Initiation of maintenance Rituxan on a 3 month schedule 12/23/2016; final Rituxan 08/31/2018  Thyroid ultrasound 02/07/2019-left cervical lymphadenopathy  PET scan 03/08/2019-extensive recurrent hypermetabolic lymphoma involving the neck, chest, abdomen and pelvis.  03/16/2019 left cervical lymph node biopsy-features consistent with previously diagnosed non-Hodgkin's B-cell lymphoma, phenotypically consistent with marginal zone lymphoma. Flow cytometry with lambda restricted B-cell population without expression of CD5 or CD10 comprising 87% of all lymphocytes.  Cycle 1 bendamustine/Rituxan 03/22/2019  Excision deep left axillary lymph nodes 05/04/2019-non-Hodgkin's B-cell lymphoma with differential including a marginal zone lymphoma and atypical small lymphocytic lymphoma. Flow cytometry with monoclonal B-cell population without expression of CD5 or CD10, comprises 96% of all lymphocytes.  Cycle 1 [facility]/Rituxan 05/18/2019  Cycle 2 [facility]/Rituxan 06/08/2019  CTs 06/15/2019-partial improvement in diffuse adenopathy, stable mild splenomegaly  Cycle 1 Revlimid/rituximab 07/19/2019 (Revlimid start 07/20/2019)  Cycle 2 Revlimid/rituximab 08/16/2019 (Revlimid placed on hold 08/30/2019 due to neutropenia)  Cycle 3 of Revlimid/rituximab 09/13/2019 (Revlimid schedule changed to 14 days on/14 days off)  Cycle 4 Revlimid/rituximab 10/10/2019  Cycle 5 Revlimid/rituximab  11/07/2019  Cycle 6 Revlimid/rituximab 12/07/2019  CTs 12/28/2019-diffuse lymphadenopathy-slightly increased compared to 06/15/2019  Cycle 7 Revlimid/rituximab 01/04/2020  Cycle 8 Revlimid/Rituxan 02/01/2020  Cycle 9 Revlimid/Rituxan 03/05/2020  Cycle 10 Revlimid/Rituxan 04/02/2020  Cycle 11 Revlimid/Rituxan 04/30/2020  CT 05/22/2020-mild increase in left supraclavicular adenopathy, mild increase in chest, retroperitoneal, and pelvic adenopathy. Stable mild splenomegaly.  Acalabrutinib started 06/19/2020  CT abdomen/pelvis 06/23/2020-moderate left and trace right pleural effusions, new diffuse mesenteric edema and ascites, stable retroperitoneal and iliac adenopathy, mild splenomegaly 2. Stage IV (T1bN1b M0) papillary thyroid cancer, status post a thyroidectomy with reimplantation of the left superior parathyroid gland on 05/23/2012, status post radioactive iodine therapy, followed by Dr. Buddy Duty.  3. Stage II (T3 N0) colon cancer, status post a right colectomy 10/19/2011, last colonoscopy April 2015-sigmoid adenoma removed.  4. History of a pulmonary embolism December 2012.  5. History of Atrial fibrillation 6. Iron deficiency anemia-new 03/18/2014. Hemoccult positive stool. The anemia corrected with iron. No longer taking iron.  Status post an upper endoscopy and colonoscopy by Dr. Carlean Purl April 2015 with no bleeding source identified, benign adenoma removed from the sigmoid colon. 7. Report of an upper gastric intestinal bleed fall 2016-managed in [place]. [name] 8. Left knee replacement May 2017, repeat left knee surgery May 2018 9. Pruritic rash 07/22/2016 10. Nausea and diarrhea 04/02/2019-stool negative for C. difficile toxin 11. 06/18/2019 [facility] admission for symptomatic anemia. 12.  Admission 06/23/2020 with nausea and diarrhea  Ms. Ibrahim appears partially improved.  She has been restarted on Calquence with no recurrent diarrhea.  She still has persistent nausea without vomiting. The  etiology of her symptoms remains unclear.  I suspect her symptoms are related to lymphoma as opposed to toxicity from the acalabrutinib.  Chest x-ray did not show any significant pleural effusion and will hold on thoracentesis.  We will start the patient on prednisone 10 mg daily for failure to thrive/anorexia.  Recommendations: 1.  Continue acalabrutinib 2.  Continue as needed antiemetics 3.  Continue antibiotics for treatment of the urinary tract infection 4.  Prednisone 10 mg daily for failure to thrive and anorexia 5.  Please call oncology as needed.  She is scheduled for outpatient follow-up at the Cancer center on 06/30/2020.   LOS: 3 days   Mikey Bussing 06/27/20 Ms. Delval was interviewed and examined.  She continues to have anorexia and nausea.  No vomiting or diarrhea.  The chest x-ray yesterday did not show a significant pleural effusion.  She will begin prednisone as an appetite stimulant.  The palpable lymph nodes appear smaller and the LDH is lower.  She will continue acalabrutinib.  Outpatient follow-up as scheduled for 06/30/2020.  Julieanne Manson, MD

## 2020-06-27 NOTE — Plan of Care (Signed)

## 2020-06-27 NOTE — Progress Notes (Signed)
PROGRESS NOTE    Tracy Mclaughlin  OZD:[id number] DOB: 1934-04-16 DOA: 06/23/2020 PCP: [name], PA   Chef Complaints: [place] vomiting  Brief Narrative:  86 y.o. 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 [name], hypothyroidism, history of A. fib, pancytopenia presents to the ED with 3-4 day history of nausea vomiting and diarrhea and abdominal pain-and right upper quadrant. symptomts started after new chemo on Thursday.Patient denies any fever chest pain. vomitted x3 today and had non bloody diarrhea 4 times today.  ED Course: Hemodynamically stable, afebrile, UA positive for UTI,CT scan abdomen done that showed free fluid with ascites and mesenteric edema, case was discussed Dr. [name] from oncology advised UTI treatment with Zosyn and he will see the patient in the morning.  Subjective:   This morning still having nausea, has some shortness of breath.  No vomiting, no diarrhea.    Assessment & Plan:  Intractable nausea vomiting diarrhea and abdominal pain :  Unclear etiology likely from lymphoma rather than chemotherapy.  No more diarrhea, but is still with nausea.  Patient is started back on acalabrutinib 7/8.  Added on a steroid for failure to thrive by oncology.  Monitor for symptomatic improvemetn  Shortness of breath-pleural effusion on chest x-ray, likely from #1 lymphoma.  Added steroid as above.    Ascites new onset suspect from lymphoma. Op f/u , monitor. No plan for tapping  E coli UTI QVZ:DGLOVFI immunosuppressed.  Antibiotics switched to ceftriaxone.   Non Hodgkin's lymphoma-with progressive decline, patient was started past week on Acalabrutinib she did not have significant response to chemotherapy last year.  Patient is back on chemotherapy here, no diarrhea but still with nausea.  LDH at 285.  Oncology starting on a steroid.  Continue  to augment nutrition PT OT.   Failure to thrive/deconditioning continue nutrition, PT, increase diet.  Added a steroid.  Hypertension: BP is controlled on Cardizem.    Pancytopenia anemia mild thrombocytopenia in the setting of lymphoma.  CBC stable.  History of intermittent atrial fibrillation.   Continue her home Cardizem.  Not on anticoagulation defer to her outpatient cardiologist.    DVT prophylaxis: heparin injection 5,000 Units Start: 06/23/20 2200 Code Status: DNR Family Communication: plan of care discussed with patient at bedside.  Status is: admitted as Observation  Patient remains symptomatic with nausea, ongoing diarrhea and remains hospitalized for further management, and she has been changed to inpatient status with ongoing symptoms. Monitoring her symptoms after restarting her chemo.   Dispo: The patient is from: Home              Anticipated d/c is to: Home PT eval requested as recommended supervision/24 HR assistance-unable to do PT today due to fatigue/lethargy.               Anticipated d/c date is: 1 day              Patient currently is not medically stable to d/c.  Nutrition: Diet Order            Diet regular Room service appropriate? Yes; Fluid consistency: Thin  Diet effective now                       Body mass index is 26.46 kg/m.  Consultants:see note  Procedures:see note Microbiology:see note  Medications: Scheduled Meds: . acalabrutinib  100 mg Oral Daily  .  Chlorhexidine Gluconate Cloth  6 each Topical Daily  . citalopram  40 mg Oral Daily  . diltiazem  180 mg Oral QHS  . heparin  5,000 Units Subcutaneous Q8H  . levothyroxine  224 mcg Oral Q0600  . melatonin  3 mg Oral QHS  . [START ON 06/28/2020] predniSONE  10 mg Oral Q breakfast   Continuous Infusions: . cefTRIAXone (ROCEPHIN)  IV 1 g (06/27/20 0950)    Antimicrobials: Anti-infectives (From admission, onward)   Start     Dose/Rate Route Frequency Ordered Stop   06/27/20  0830  cefTRIAXone (ROCEPHIN) 1 g in sodium chloride 0.9 % 100 mL IVPB     Discontinue     1 g 200 mL/hr over 30 Minutes Intravenous Every 24 hours 06/27/20 0733     06/24/20 0200  piperacillin-tazobactam (ZOSYN) IVPB 3.375 g  Status:  Discontinued        3.375 g 12.5 mL/hr over 240 Minutes Intravenous Every 8 hours 06/23/20 1703 06/27/20 0733   06/23/20 1645  piperacillin-tazobactam (ZOSYN) IVPB 3.375 g        3.375 g 100 mL/hr over 30 Minutes Intravenous  Once 06/23/20 1638 06/23/20 1834   06/23/20 1615  cefTRIAXone (ROCEPHIN) 1 g in sodium chloride 0.9 % 100 mL IVPB  Status:  Discontinued        1 g 200 mL/hr over 30 Minutes Intravenous  Once 06/23/20 1607 06/23/20 1608       Objective: Vitals: Today's Vitals   06/27/20 0937 06/27/20 1056 06/27/20 1233 06/27/20 1311  BP:    (!) 144/63  Pulse:    78  Resp:    17  Temp:    98.4 F (36.9 C)  TempSrc:    Oral  SpO2:    92%  Weight:      Height:      PainSc: 0-No pain 0-No pain Asleep     Intake/Output Summary (Last 24 hours) at 06/27/2020 1346 Last data filed at 06/27/2020 1317 Gross per 24 hour  Intake 871.87 ml  Output 0 ml  Net 871.87 ml   Filed Weights   06/23/20 1206  Weight: 78.9 kg   Weight change:    Intake/Output from previous day: 07/08 0701 - 07/09 0700 In: 880 [P.O.:780; IV Piggyback:100] Out: 0  Intake/Output this shift: Total I/O In: 120 [P.O.:120] Out: -   Examination:  General exam: AAO X3, frail, NAD, weak appearing. HEENT:Oral mucosa moist, Ear/Nose WNL grossly, dentition normal. Respiratory system: bilaterally clear,no wheezing or crackles,no use of accessory muscle Cardiovascular system: S1 & S2 +, No JVD. Gastrointestinal system: Abdomen soft, full mildly tender abdomen,ND, BS+ Nervous System:Alert, awake, moving extremities and grossly nonfocal Extremities: No edema, distal peripheral pulses palpable.  Skin: No rashes,no icterus. MSK: Normal muscle bulk,tone, power  Data Reviewed: I  have personally reviewed following labs and imaging studies CBC: Recent Labs  Lab 06/23/20 1411 06/24/20 0454 06/26/20 0346  WBC 6.5 6.3 6.3  NEUTROABS 2.8  --   --   HGB 12.5 11.4* 12.0  HCT 36.7 34.7* 35.6*  MCV 103.4* 104.2* 103.2*  PLT 142* 121* 381*   Basic Metabolic Panel: Recent Labs  Lab 06/23/20 1411 06/24/20 0454 06/26/20 0346  NA 141 138 136  K 5.1 4.3 3.8  CL 104 103 99  CO2 25 26 25   GLUCOSE 105* 105* 98  BUN 18 15 11   CREATININE 1.06* 0.95 0.84  CALCIUM 9.1 8.5* 8.1*   GFR: Estimated Creatinine Clearance: 56 mL/min (by C-G formula  based on SCr of 0.84 mg/dL). Liver Function Tests: Recent Labs  Lab 06/23/20 1411 06/24/20 0454 06/26/20 0346  AST 31 29 49*  ALT 11 9 20   ALKPHOS 98 90 133*  BILITOT 1.0 1.0 1.1  PROT 6.5 6.0* 5.8*  ALBUMIN 3.6 3.3* 3.2*   Recent Labs  Lab 06/23/20 1411  LIPASE 19   No results for input(s): AMMONIA in the last 168 hours. Coagulation Profile: No results for input(s): INR, PROTIME in the last 168 hours. Cardiac Enzymes: No results for input(s): CKTOTAL, CKMB, CKMBINDEX, TROPONINI in the last 168 hours. BNP (last 3 results) No results for input(s): PROBNP in the last 8760 hours. HbA1C: No results for input(s): HGBA1C in the last 72 hours. CBG: No results for input(s): GLUCAP in the last 168 hours. Lipid Profile: No results for input(s): CHOL, HDL, LDLCALC, TRIG, CHOLHDL, LDLDIRECT in the last 72 hours. Thyroid Function Tests: No results for input(s): TSH, T4TOTAL, FREET4, T3FREE, THYROIDAB in the last 72 hours. Anemia Panel: No results for input(s): VITAMINB12, FOLATE, FERRITIN, TIBC, IRON, RETICCTPCT in the last 72 hours. Sepsis Labs: Recent Labs  Lab 06/23/20 1411  LATICACIDVEN 0.7    Recent Results (from the past 240 hour(s))  Urine culture     Status: Abnormal   Collection Time: 06/23/20  2:11 PM   Specimen: Urine, Random  Result Value Ref Range Status   Specimen Description   Final    URINE,  RANDOM Performed at [facility] [address] [place], [place] [address]    Special Requests   Final    NONE Performed at [facility], [place] [address] [place], [place] [address]    Culture >=100,000 COLONIES/mL ESCHERICHIA COLI (A)  Final   Report Status 06/26/2020 FINAL  Final   Organism ID, Bacteria ESCHERICHIA COLI (A)  Final      Susceptibility   Escherichia coli - MIC*    AMPICILLIN >=32 RESISTANT Resistant     CEFAZOLIN <=4 SENSITIVE Sensitive     CEFTRIAXONE <=0.25 SENSITIVE Sensitive     CIPROFLOXACIN <=0.25 SENSITIVE Sensitive     GENTAMICIN <=1 SENSITIVE Sensitive     IMIPENEM <=0.25 SENSITIVE Sensitive     NITROFURANTOIN <=16 SENSITIVE Sensitive     TRIMETH/SULFA <=20 SENSITIVE Sensitive     AMPICILLIN/SULBACTAM >=32 RESISTANT Resistant     PIP/TAZO <=4 SENSITIVE Sensitive     * >=100,000 COLONIES/mL ESCHERICHIA COLI  C Difficile Quick Screen w PCR reflex     Status: None   Collection Time: 06/23/20  4:26 PM   Specimen: Nasopharyngeal Swab; Stool  Result Value Ref Range Status   C Diff antigen NEGATIVE NEGATIVE Final   C Diff toxin NEGATIVE NEGATIVE Final   C Diff interpretation No C. difficile detected.  Final    Comment: Performed at [facility], [place] [address] [place], [place] [address]  Blood culture (routine x 2)     Status: None (Preliminary result)   Collection Time: 06/23/20  5:58 PM   Specimen: BLOOD  Result Value Ref Range Status   Specimen Description   Final    BLOOD SITE NOT SPECIFIED Performed at [facility] [address], [place], [place] [address]    Special Requests   Final    BOTTLES DRAWN AEROBIC AND ANAEROBIC Blood Culture adequate volume Performed at [facility] [address] [place], [place] [address]    Culture   Final    NO GROWTH 3 DAYS  Performed at [facility], [place] [address] [place], [place] [address]    Report Status PENDING   Incomplete  Blood culture (routine x 2)     Status: None (Preliminary result)   Collection Time: 06/23/20  5:58 PM   Specimen: BLOOD  Result Value Ref Range Status   Specimen Description   Final    BLOOD SITE NOT SPECIFIED Performed at [facility] [place] [address], [place], [place] [address]    Special Requests   Final    BOTTLES DRAWN AEROBIC AND ANAEROBIC Blood Culture adequate volume Performed at [facility] [address] [place], [place] [address]    Culture   Final    NO GROWTH 3 DAYS Performed at [facility], [place] [address] [place], [place] [address]    Report Status PENDING  Incomplete  SARS Coronavirus 2 by RT PCR (hospital order, performed in [facility] hospital lab) Nasopharyngeal Nasopharyngeal Swab     Status: None   Collection Time: 06/23/20  5:58 PM   Specimen: Nasopharyngeal Swab  Result Value Ref Range Status   SARS Coronavirus 2 NEGATIVE NEGATIVE Final    Comment: (NOTE) SARS-CoV-2 target nucleic acids are NOT DETECTED.  The SARS-CoV-2 RNA is generally detectable in upper and lower respiratory specimens during the acute phase of infection. The lowest concentration of SARS-CoV-2 viral copies this assay can detect is 250 copies / mL. A negative result does not preclude SARS-CoV-2 infection and should not be used as the sole basis for treatment or other patient management decisions.  A negative result may occur with improper specimen collection / handling, submission of specimen other than nasopharyngeal swab, presence of viral mutation(s) within the areas targeted by this assay, and inadequate number of viral copies (<250 copies / mL). A negative result must be combined with clinical observations, patient history, and epidemiological information.  Fact Sheet for Patients:   StrictlyIdeas.no  Fact Sheet for Healthcare Providers: BankingDealers.co.za  This test is not yet  approved or  cleared by the [place] FDA and has been authorized for detection and/or diagnosis of SARS-CoV-2 by FDA under an Emergency Use Authorization (EUA).  This EUA will remain in effect (meaning this test can be used) for the duration of the COVID-19 declaration under Section 564(b)(1) of the Act, 21 U.S.C. section 360bbb-3(b)(1), unless the authorization is terminated or revoked sooner.  Performed at [facility], [place] [address] [place], [place] [address]       Radiology Studies: DG Chest 2 View  Result Date: 06/26/2020 CLINICAL DATA:  Dyspnea. EXAM: CHEST - 2 VIEW COMPARISON:  Chest CT 05/22/2020 FINDINGS: Right chest port remains in place. Hazy opacity at the left lung base consistent with pleural effusion and airspace disease/atelectasis. Right lung is clear. Upper normal heart size. Mild right hilar prominence likely secondary to combination of vascular structures and adenopathy is seen on recent CT. Fullness in the upper mediastinum likely secondary to adenopathy. No pneumothorax. No pulmonary edema. Surgical clips in the left axilla. IMPRESSION: 1. Hazy opacity at the left lung base consistent with pleural effusion and airspace disease/atelectasis. 2. Mild right hilar prominence likely secondary to combination of vascular structures and adenopathy seen on recent CT. Electronically Signed   By: Keith Rake M.D.   On: 06/26/2020 15:54     LOS: 3 days   Antonieta Pert, MD [facility]  06/27/2020, 1:46 PM

## 2020-06-28 LAB — COMPREHENSIVE METABOLIC PANEL
ALT: 33 U/L (ref 0–44)
AST: 102 U/L — ABNORMAL HIGH (ref 15–41)
Albumin: 3.5 g/dL (ref 3.5–5.0)
Alkaline Phosphatase: 136 U/L — ABNORMAL HIGH (ref 38–126)
Anion gap: 9 (ref 5–15)
BUN: 7 mg/dL — ABNORMAL LOW (ref 8–23)
CO2: 28 mmol/L (ref 22–32)
Calcium: 7.8 mg/dL — ABNORMAL LOW (ref 8.9–10.3)
Chloride: 96 mmol/L — ABNORMAL LOW (ref 98–111)
Creatinine, Ser: 0.7 mg/dL (ref 0.44–1.00)
GFR calc Af Amer: >60 mL/min (ref >60)
GFR calc non Af Amer: >60 mL/min (ref >60)
Glucose, Bld: 106 mg/dL — ABNORMAL HIGH (ref 70–99)
Potassium: 3.3 mmol/L — ABNORMAL LOW (ref 3.5–5.1)
Sodium: 133 mmol/L — ABNORMAL LOW (ref 135–145)
Total Bilirubin: 1.1 mg/dL (ref 0.3–1.2)
Total Protein: 6.2 g/dL — ABNORMAL LOW (ref 6.5–8.1)

## 2020-06-28 LAB — CULTURE, BLOOD (ROUTINE X 2)
Culture: NO GROWTH
Culture: NO GROWTH
Special Requests: ADEQUATE
Special Requests: ADEQUATE

## 2020-06-28 MED ORDER — PANTOPRAZOLE SODIUM 40 MG PO TBEC: 40.0000 mg | DELAYED_RELEASE_TABLET | Freq: Every day | ORAL | Status: DC

## 2020-06-28 MED ORDER — FAMOTIDINE 20 MG PO TABS
20.0000 mg | ORAL_TABLET | Freq: Every day | ORAL | Status: DC
  Administered 2020-06-28 – 2020-06-30 (×3): 20 mg via ORAL
  Filled 2020-06-28 (×3): qty 1

## 2020-06-28 MED ORDER — ZOLPIDEM TARTRATE 5 MG PO TABS
5.0000 mg | ORAL_TABLET | Freq: Once | ORAL | Status: AC
  Administered 2020-06-28: 5 mg via ORAL
  Filled 2020-06-28: qty 1

## 2020-06-28 MED ORDER — POTASSIUM CHLORIDE CRYS ER 20 MEQ PO TBCR
40.0000 meq | EXTENDED_RELEASE_TABLET | Freq: Once | ORAL | Status: AC
  Administered 2020-06-28: 40 meq via ORAL
  Filled 2020-06-28: qty 2

## 2020-06-28 MED ORDER — SODIUM CHLORIDE 0.9 % IV SOLN
INTRAVENOUS | Status: DC | PRN
  Administered 2020-06-28: 1000 mL via INTRAVENOUS
  Administered 2020-07-18: 250 mL via INTRAVENOUS

## 2020-06-28 MED ORDER — ALUM & MAG HYDROXIDE-SIMETH 200-200-20 MG/5ML PO SUSP
30.0000 mL | ORAL | Status: DC | PRN
  Administered 2020-06-28 – 2020-06-29 (×4): 30 mL via ORAL
  Filled 2020-06-28 (×4): qty 30

## 2020-06-28 NOTE — Progress Notes (Signed)
Physical Therapy Treatment Patient Details Name: Tracy Mclaughlin MRN: [id number] DOB: 11/01/1934 Today's Date: 06/28/2020    History of Present Illness 86 yo female admitted with N/V/D, abd pain, UTI, ascites. Hx of NHL, hypothyroidism, A fib, anemia, OA, PE, SAH    PT Comments    Pt profoundly weaker than at time of PT eval a few days ago, she has been refusing to amb/OOB. Pt now requiring mod-max assist for sit to stand transfers, +2 for hygiene. Amb 100' with heavy reliance on RW and min assist for balance and safety. I am not sure pt's husband can manage her at home at her current status. She really needs SNF at this point  however is refusing any f/u PT at this time. May need w/c for home given for safety.   Follow Up Recommendations  Other (comment) (SNF vs HHPT - pt refusing )     Equipment Recommendations  None recommended by PT;Other (comment) (?may need w/c)    Recommendations for Other Services       Precautions / Restrictions Precautions Precautions: Fall Restrictions Weight Bearing Restrictions: No    Mobility  Bed Mobility Overal bed mobility: Needs Assistance Bed Mobility: Rolling;Sidelying to Sit;Supine to Sit Rolling: Min guard Sidelying to sit: Min assist Supine to sit: Min assist     General bed mobility comments: assist to elevate  trunk, assist lift LEs on to bed   Transfers Overall transfer level: Needs assistance Equipment used: Rolling walker (2 wheeled) Transfers: Sit to/from Stand Sit to Stand: Mod assist;Max assist;+2 safety/equipment         General transfer comment: cues for hand placement,  assist with anterior-superior wt shift; repeated x3 to allow peri-care and brief change, second person needed for peri-care   Ambulation/Gait Ambulation/Gait assistance: Min assist Gait Distance (Feet): 100 Feet Assistive device: Rolling walker (2 wheeled) Gait Pattern/deviations: Step-through pattern;Decreased stride length;Trunk  flexed Gait velocity: decr   General Gait Details: multiple rest breaks needed d/t fatigue and dyspnea. SpO2=92% on RA after amb.  pt heavily reliant on RW, min assist for balance and safety   Stairs             Wheelchair Mobility    Modified Rankin (Stroke Patients Only)       Balance           Standing balance support: Bilateral upper extremity supported Standing balance-Leahy Scale: Poor                              Cognition Arousal/Alertness: Awake/alert Behavior During Therapy: WFL for tasks assessed/performed Overall Cognitive Status: Within Functional Limits for tasks assessed                                        Exercises      General Comments        Pertinent Vitals/Pain Pain Assessment: No/denies pain    Home Living                      Prior Function            PT Goals (current goals can now be found in the care plan section) Acute Rehab PT Goals Patient Stated Goal: home soon PT Goal Formulation: With patient Time For Goal Achievement: 07/09/20 Potential to Achieve Goals: Good Progress towards PT  goals: Progressing toward goals    Frequency    Min 3X/week      PT Plan Current plan remains appropriate    Co-evaluation              AM-PAC PT "6 Clicks" Mobility   Outcome Measure  Help needed turning from your back to your side while in a flat bed without using bedrails?: A Little Help needed moving from lying on your back to sitting on the side of a flat bed without using bedrails?: A Little Help needed moving to and from a bed to a chair (including a wheelchair)?: A Little Help needed standing up from a chair using your arms (e.g., wheelchair or bedside chair)?: A Lot Help needed to walk in hospital room?: A Little Help needed climbing 3-5 steps with a railing? : A Lot 6 Click Score: 16    End of Session Equipment Utilized During Treatment: Gait belt Activity Tolerance:  Patient limited by fatigue Patient left: in bed;with call bell/phone within reach;with bed alarm set;with nursing/sitter in room   PT Visit Diagnosis: Unsteadiness on feet (R26.81);Muscle weakness (generalized) (M62.81)     Time: 1610-9604 PT Time Calculation (min) (ACUTE ONLY): 27 min  Charges:  $Gait Training: 23-37 mins                     Baxter Flattery, PT  Acute Rehab Dept ([place]) [phone] Pager [phone]  06/28/2020    [facility] 06/28/2020, 5:00 PM

## 2020-06-28 NOTE — Progress Notes (Signed)
PROGRESS NOTE    Tracy Mclaughlin  XFG:[id number] DOB: 16-Nov-1934 DOA: 06/23/2020 PCP: [name], PA   Chef Complaints: nausea  Brief Narrative: 86 y.o.femalewith medical history significant forlow-grade lymphoma without significant response to chemotherapy last year and was recently started onBTK INHIBITORby Dr. [name], hypothyroidism, history of A. fib, pancytopenia presents to the ED with3-4 day history of nausea vomiting and diarrhea and abdominal pain-and right upper quadrant.symptomts started after new chemo on Thursday.Patient denies any fever chest pain.vomitted x3 today and had non bloody diarrhea 4 times today.  ED Course:Hemodynamically stable, afebrile, UA positive for UTI,CT scan abdomen done that showed free fluid with ascites and mesenteric edema, case was discussed Dr. [name] from oncology advised UTI treatment with Zosyn and he will see the patient in the morning.  Patient was admitted, was managed symptomatically chemo was held diarrhea improved subsequently oncology put the patient back on chemo overall tolerating well. Having some nausea anorexia suspecting this is related to her lymphoma. Patient has been started on prednisone to help with this. Seen by physical therapy would benefit with home health PT but patient declined, if she agrees we can arrange home health PT upon discharge.  Subjective:  C/o low appetite, abd pain ( not new) Does not feel ready for home today feels some better. Would like to be able to eat more before going home No diarrhea C/o being weak but refused PT  Assessment & Plan:  Intractable nausea vomitingdiarrhea andabdominal pai : Unclear etiology but suspecting from her lymphoma.No more diarrhea, now on prednisone, continue symptomatic management. Back on Acalabrutinib 7/8 and tolerating.  Continue diet, complaints of low appetite.  Shortness of breath-pleural effusion on chest x-ray, likely from #1 lymphoma. On room  air. Steroid as above. Does not c/o breathing problems today  Mild abd pain- with ascites new onset suspect from lymphoma.  Add Pepcid, continue Maalox, encourage oral intake.   E coli UTI RCV:ELFYBOF immunosuppressed.  Antibiotics switched to ceftriaxone. We will discharge on Keflex  Non Hodgkin's lymphoma-with progressive decline, patientwasstartedpast weekonAcalabrutinib she did not have significant response to chemotherapy last year.  Patient is back on chemotherapy here, no diarrhea but still with nausea.  LDH at 285.  Oncology starting on a steroid.  Continue to augment nutrition PT OT.   Failure to thrive/deconditioning continue nutrition, PT-declined home health. Now on a steroid to help with anorexia.  Hypertension: BP is controlled on Cardizem.    Pancytopenia anemia mild thrombocytopenia in the setting of lymphoma.  CBC stable.  History of intermittent atrial fibrillation.  Continue her home Cardizem.  Not on anticoagulation defer to her outpatient cardiologist.    DVT prophylaxis: heparin injection 5,000 Units Start: 06/23/20 2200 Code Status: DNR Family Communication: plan of care discussed with patient at bedside and updated husband over the phone today.  Status is: Inpatient  Remains inpatient appropriate because:IV treatments appropriate due to intensity of illness or inability to take PO and Inpatient level of care appropriate due to severity of illness   Dispo: The patient is from: Home              Anticipated d/c is to: Home              Anticipated d/c date is: 1 day              Patient currently is not medically stable to d/c. home tomorrow if eating well-spoke w/ husband. She has appointment to see Dr Renard Hamper who can reevaluate  her.  Nutrition: Diet Order            Diet regular Room service appropriate? Yes; Fluid consistency: Thin  Diet effective now                       Body mass index is 26.46 kg/m. Pressure Ulcer:     Consultants:see note  Procedures:see note Microbiology:see note  Medications: Scheduled Meds: . acalabrutinib  100 mg Oral Daily  . Chlorhexidine Gluconate Cloth  6 each Topical Daily  . citalopram  40 mg Oral Daily  . diltiazem  180 mg Oral QHS  . heparin  5,000 Units Subcutaneous Q8H  . levothyroxine  224 mcg Oral Q0600  . melatonin  3 mg Oral QHS  . pantoprazole  40 mg Oral Daily  . potassium chloride  40 mEq Oral Once  . predniSONE  10 mg Oral Q breakfast   Continuous Infusions: . cefTRIAXone (ROCEPHIN)  IV 1 g (06/28/20 1638)    Antimicrobials: Anti-infectives (From admission, onward)   Start     Dose/Rate Route Frequency Ordered Stop   06/27/20 0830  cefTRIAXone (ROCEPHIN) 1 g in sodium chloride 0.9 % 100 mL IVPB     Discontinue     1 g 200 mL/hr over 30 Minutes Intravenous Every 24 hours 06/27/20 0733     06/24/20 0200  piperacillin-tazobactam (ZOSYN) IVPB 3.375 g  Status:  Discontinued        3.375 g 12.5 mL/hr over 240 Minutes Intravenous Every 8 hours 06/23/20 1703 06/27/20 0733   06/23/20 1645  piperacillin-tazobactam (ZOSYN) IVPB 3.375 g        3.375 g 100 mL/hr over 30 Minutes Intravenous  Once 06/23/20 1638 06/23/20 1834   06/23/20 1615  cefTRIAXone (ROCEPHIN) 1 g in sodium chloride 0.9 % 100 mL IVPB  Status:  Discontinued        1 g 200 mL/hr over 30 Minutes Intravenous  Once 06/23/20 1607 06/23/20 1608       Objective: Vitals: Today's Vitals   06/27/20 1311 06/27/20 2039 06/28/20 0624 06/28/20 0800  BP: (!) 144/63 (!) 142/54 (!) 163/73   Pulse: 78 81 81   Resp: 17 18 18    Temp: 98.4 F (36.9 C) 98.9 F (37.2 C) 98.7 F (37.1 C)   TempSrc: Oral Oral Oral   SpO2: 92% 90% 94%   Weight:      Height:      PainSc:    0-No pain    Intake/Output Summary (Last 24 hours) at 06/28/2020 0957 Last data filed at 06/28/2020 0600 Gross per 24 hour  Intake 860 ml  Output --  Net 860 ml   Filed Weights   06/23/20 1206  Weight: 78.9 kg   Weight  change:    Intake/Output from previous day: 07/09 0701 - 07/10 0700 In: 920 [P.O.:820; IV Piggyback:100] Out: -  Intake/Output this shift: No intake/output data recorded.  Examination:  General exam: AAO, bit confused abt day, NAD, weak appearing. HEENT:Oral mucosa moist, Ear/Nose WNL grossly,dentition normal. Respiratory system: bilaterally CLEAR,no wheezing or crackles,no use of accessory muscle, non tender. Cardiovascular system: S1 & S2 +, regular, No JVD. Gastrointestinal system: Abdomen soft, mild epigastric tenderness,ND, BS+. Nervous System:Alert, awake, moving extremities and grossly nonfocal Extremities: No edema, distal peripheral pulses palpable.  Skin: No rashes,no icterus. MSK: Normal muscle bulk,tone, power  Data Reviewed: I have personally reviewed following labs and imaging studies CBC: Recent Labs  Lab 06/23/20 1411 06/24/20 0454 06/26/20  0346  WBC 6.5 6.3 6.3  NEUTROABS 2.8  --   --   HGB 12.5 11.4* 12.0  HCT 36.7 34.7* 35.6*  MCV 103.4* 104.2* 103.2*  PLT 142* 121* 045*   Basic Metabolic Panel: Recent Labs  Lab 06/23/20 1411 06/24/20 0454 06/26/20 0346 06/28/20 0818  NA 141 138 136 133*  K 5.1 4.3 3.8 3.3*  CL 104 103 99 96*  CO2 25 26 25 28   GLUCOSE 105* 105* 98 106*  BUN 18 15 11  7*  CREATININE 1.06* 0.95 0.84 0.70  CALCIUM 9.1 8.5* 8.1* 7.8*   GFR: Estimated Creatinine Clearance: 58.8 mL/min (by C-G formula based on SCr of 0.7 mg/dL). Liver Function Tests: Recent Labs  Lab 06/23/20 1411 06/24/20 0454 06/26/20 0346 06/28/20 0818  AST 31 29 49* 102*  ALT 11 9 20  33  ALKPHOS 98 90 133* 136*  BILITOT 1.0 1.0 1.1 1.1  PROT 6.5 6.0* 5.8* 6.2*  ALBUMIN 3.6 3.3* 3.2* 3.5   Recent Labs  Lab 06/23/20 1411  LIPASE 19   No results for input(s): AMMONIA in the last 168 hours. Coagulation Profile: No results for input(s): INR, PROTIME in the last 168 hours. Cardiac Enzymes: No results for input(s): CKTOTAL, CKMB, CKMBINDEX,  TROPONINI in the last 168 hours. BNP (last 3 results) No results for input(s): PROBNP in the last 8760 hours. HbA1C: No results for input(s): HGBA1C in the last 72 hours. CBG: No results for input(s): GLUCAP in the last 168 hours. Lipid Profile: No results for input(s): CHOL, HDL, LDLCALC, TRIG, CHOLHDL, LDLDIRECT in the last 72 hours. Thyroid Function Tests: No results for input(s): TSH, T4TOTAL, FREET4, T3FREE, THYROIDAB in the last 72 hours. Anemia Panel: No results for input(s): VITAMINB12, FOLATE, FERRITIN, TIBC, IRON, RETICCTPCT in the last 72 hours. Sepsis Labs: Recent Labs  Lab 06/23/20 1411  LATICACIDVEN 0.7    Recent Results (from the past 240 hour(s))  Urine culture     Status: Abnormal   Collection Time: 06/23/20  2:11 PM   Specimen: Urine, Random  Result Value Ref Range Status   Specimen Description   Final    URINE, RANDOM Performed at [facility] [address] [place], [place] [address]    Special Requests   Final    NONE Performed at [facility], [place] [address], [place], [place] [address]    Culture >=100,000 COLONIES/mL ESCHERICHIA COLI (A)  Final   Report Status 06/26/2020 FINAL  Final   Organism ID, Bacteria ESCHERICHIA COLI (A)  Final      Susceptibility   Escherichia coli - MIC*    AMPICILLIN >=32 RESISTANT Resistant     CEFAZOLIN <=4 SENSITIVE Sensitive     CEFTRIAXONE <=0.25 SENSITIVE Sensitive     CIPROFLOXACIN <=0.25 SENSITIVE Sensitive     GENTAMICIN <=1 SENSITIVE Sensitive     IMIPENEM <=0.25 SENSITIVE Sensitive     NITROFURANTOIN <=16 SENSITIVE Sensitive     TRIMETH/SULFA <=20 SENSITIVE Sensitive     AMPICILLIN/SULBACTAM >=32 RESISTANT Resistant     PIP/TAZO <=4 SENSITIVE Sensitive     * >=100,000 COLONIES/mL ESCHERICHIA COLI  C Difficile Quick Screen w PCR reflex     Status: None   Collection Time: 06/23/20  4:26 PM   Specimen: Nasopharyngeal Swab; Stool  Result Value Ref Range Status   C Diff  antigen NEGATIVE NEGATIVE Final   C Diff toxin NEGATIVE NEGATIVE Final   C Diff interpretation No C. difficile detected.  Final  Comment: Performed at [facility], [place] [address] [place], [place] [address]  Blood culture (routine x 2)     Status: None (Preliminary result)   Collection Time: 06/23/20  5:58 PM   Specimen: BLOOD  Result Value Ref Range Status   Specimen Description   Final    BLOOD SITE NOT SPECIFIED Performed at [facility] [address] [place], [place] [address]    Special Requests   Final    BOTTLES DRAWN AEROBIC AND ANAEROBIC Blood Culture adequate volume Performed at [facility] [address] [place], [place] [address]    Culture   Final    NO GROWTH 4 DAYS Performed at [facility], [place] [address], [place], [place] [address]    Report Status PENDING  Incomplete  Blood culture (routine x 2)     Status: None (Preliminary result)   Collection Time: 06/23/20  5:58 PM   Specimen: BLOOD  Result Value Ref Range Status   Specimen Description   Final    BLOOD SITE NOT SPECIFIED Performed at [facility] [address] [place], [place] [address]    Special Requests   Final    BOTTLES DRAWN AEROBIC AND ANAEROBIC Blood Culture adequate volume Performed at [facility] [address], [place], [place] [address]    Culture   Final    NO GROWTH 4 DAYS Performed at [facility], [place] [address] [place], [place] [address]    Report Status PENDING  Incomplete  SARS Coronavirus 2 by RT PCR (hospital order, performed in [facility] hospital lab) Nasopharyngeal Nasopharyngeal Swab     Status: None   Collection Time: 06/23/20  5:58 PM   Specimen: Nasopharyngeal Swab  Result Value Ref Range Status   SARS Coronavirus 2 NEGATIVE NEGATIVE Final    Comment: (NOTE) SARS-CoV-2 target nucleic acids are NOT DETECTED.  The SARS-CoV-2 RNA is generally detectable in upper and  lower respiratory specimens during the acute phase of infection. The lowest concentration of SARS-CoV-2 viral copies this assay can detect is 250 copies / mL. A negative result does not preclude SARS-CoV-2 infection and should not be used as the sole basis for treatment or other patient management decisions.  A negative result may occur with improper specimen collection / handling, submission of specimen other than nasopharyngeal swab, presence of viral mutation(s) within the areas targeted by this assay, and inadequate number of viral copies (<250 copies / mL). A negative result must be combined with clinical observations, patient history, and epidemiological information.  Fact Sheet for Patients:   StrictlyIdeas.no  Fact Sheet for Healthcare Providers: BankingDealers.co.za  This test is not yet approved or  cleared by the [place] FDA and has been authorized for detection and/or diagnosis of SARS-CoV-2 by FDA under an Emergency Use Authorization (EUA).  This EUA will remain in effect (meaning this test can be used) for the duration of the COVID-19 declaration under Section 564(b)(1) of the Act, 21 U.S.C. section 360bbb-3(b)(1), unless the authorization is terminated or revoked sooner.  Performed at [facility], [place] [address], [place], [place] [address]       Radiology Studies: DG Chest 2 View  Result Date: 06/26/2020 CLINICAL DATA:  Dyspnea. EXAM: CHEST - 2 VIEW COMPARISON:  Chest CT 05/22/2020 FINDINGS: Right chest port remains in place. Hazy opacity at the left lung base consistent with pleural effusion and airspace disease/atelectasis. Right lung is clear.  Upper normal heart size. Mild right hilar prominence likely secondary to combination of vascular structures and adenopathy is seen on recent CT. Fullness in the upper mediastinum likely secondary to adenopathy. No pneumothorax. No pulmonary edema.  Surgical clips in the left axilla. IMPRESSION: 1. Hazy opacity at the left lung base consistent with pleural effusion and airspace disease/atelectasis. 2. Mild right hilar prominence likely secondary to combination of vascular structures and adenopathy seen on recent CT. Electronically Signed   By: Keith Rake M.D.   On: 06/26/2020 15:54     LOS: 4 days   Antonieta Pert, MD [facility]  06/28/2020, 9:57 AM

## 2020-06-29 LAB — BASIC METABOLIC PANEL
Anion gap: 10 (ref 5–15)
BUN: 8 mg/dL (ref 8–23)
CO2: 29 mmol/L (ref 22–32)
Calcium: 7.9 mg/dL — ABNORMAL LOW (ref 8.9–10.3)
Chloride: 97 mmol/L — ABNORMAL LOW (ref 98–111)
Creatinine, Ser: 0.67 mg/dL (ref 0.44–1.00)
GFR calc Af Amer: >60 mL/min (ref >60)
GFR calc non Af Amer: >60 mL/min (ref >60)
Glucose, Bld: 121 mg/dL — ABNORMAL HIGH (ref 70–99)
Potassium: 3.5 mmol/L (ref 3.5–5.1)
Sodium: 136 mmol/L (ref 135–145)

## 2020-06-29 NOTE — Progress Notes (Signed)
PROGRESS NOTE    Tracy Mclaughlin  JOI:[id number] DOB: 1934-08-02 DOA: 06/23/2020 PCP: [name], PA   Chef Complaints: nausea  Brief Narrative: 86 y.o.femalewith medical history significant forlow-grade lymphoma without significant response to chemotherapy last year and was recently started onBTK INHIBITORby Dr. [name], hypothyroidism, history of A. fib, pancytopenia presents to the ED with3-4 day history of nausea vomiting and diarrhea and abdominal pain-and right upper quadrant.symptomts started after new chemo on Thursday.Patient denies any fever chest pain.vomitted x3 today and had non bloody diarrhea 4 times today.  ED Course:Hemodynamically stable, afebrile, UA positive for UTI,CT scan abdomen done that showed free fluid with ascites and mesenteric edema, case was discussed Dr. [name] from oncology advised UTI treatment with Zosyn and he will see the patient in the morning.  Patient was admitted, was managed symptomatically chemo was held diarrhea improved subsequently oncology put the patient back on chemo overall tolerating well. Having some nausea anorexia suspecting this is related to her lymphoma. Patient has been started on prednisone to help with this. Seen by physical therapy would benefit with home health PT but patient declined, if she agrees we can arrange home health PT upon discharge.  Subjective: Patient ate a few bites of breakfast today started with increasing abdominal pain and nausea.  She and her husband would like to stay another day she has follow-up appointment Dr. Benay Spice tomorrow. Husband lives an hour and a half from here and he feels it will be too much on her to drive back and forth 4 hours today and tomorrow for the appointment. She wants to go home she does not want to go to rehab. She is very weak.  Assessment & Plan:  Intractable nausea vomitingdiarrhea andabdominal pain : Unclear etiology but suspecting from her lymphoma.No more  diarrhea, now on prednisone, continue symptomatic management. Back on Acalabrutinib 7/8 and tolerating.  Continue diet, complaints of low appetite.  Shortness of breath-pleural effusion on chest x-ray, likely from #1 lymphoma. On room air. Steroid as above. Does not c/o breathing problems today  Mild abd pain- with ascites new onset suspect from lymphoma.  Add Pepcid, continue Maalox, encourage oral intake.   E coli UTI BRA:XENMMHW immunosuppressed.  Antibiotics switched to ceftriaxone. We will discharge on Keflex  Non Hodgkin's lymphoma-with progressive decline, patientwasstartedpast weekonAcalabrutinib she did not have significant response to chemotherapy last year.  Patient is back on chemotherapy here, no diarrhea but still with nausea.  LDH at 285.  Oncology starting on a steroid.  Continue to augment nutrition PT OT.  Patient to follow-up with Dr. Benay Spice tomorrow.  She is hoping she can be discharged home tomorrow after she sees Dr. Benay Spice.  Failure to thrive/deconditioning continue nutrition, PT-declined home health. Now on a steroid to help with anorexia.  Hypertension: Blood pressure 157/75 on Cardizem.   Pancytopenia anemia mild thrombocytopenia in the setting of lymphoma.  CBC stable.  History of intermittent atrial fibrillation.  Continue her home Cardizem.  Not on anticoagulation defer to her outpatient cardiologist.   Hypokalemia potassium 3.3 yesterday repeat labs today potassium back to normal.   DVT prophylaxis: heparin injection 5,000 Units Start: 06/23/20 2200 Code Status: DNR Family Communication: Discussed with patient's husband over the phone  Status is: Inpatient  Remains inpatient appropriate because:IV treatments appropriate due to intensity of illness or inability to take PO and Inpatient level of care appropriate due to severity of illness   Dispo: The patient is from: Home  Anticipated d/c is to: Home              Anticipated  d/c date is: 1 day              Patient currently is not medically stable to d/c. home tomorrow if eating well-spoke w/ husband. She has appointment to see Dr [name] monday who can reevaluate her.  Nutrition: Diet Order            Diet regular Room service appropriate? Yes; Fluid consistency: Thin  Diet effective now                       Body mass index is 26.46 kg/m. Pressure Ulcer:    Consultants:see note  Procedures:see note Microbiology:see note  Medications: Scheduled Meds: . acalabrutinib  100 mg Oral Daily  . Chlorhexidine Gluconate Cloth  6 each Topical Daily  . citalopram  40 mg Oral Daily  . diltiazem  180 mg Oral QHS  . famotidine  20 mg Oral Daily  . heparin  5,000 Units Subcutaneous Q8H  . levothyroxine  224 mcg Oral Q0600  . melatonin  3 mg Oral QHS  . predniSONE  10 mg Oral Q breakfast   Continuous Infusions: . sodium chloride 10 mL/hr at 06/29/20 0600  . cefTRIAXone (ROCEPHIN)  IV 1 g (06/29/20 0803)    Antimicrobials: Anti-infectives (From admission, onward)   Start     Dose/Rate Route Frequency Ordered Stop   06/27/20 0830  cefTRIAXone (ROCEPHIN) 1 g in sodium chloride 0.9 % 100 mL IVPB     Discontinue     1 g 200 mL/hr over 30 Minutes Intravenous Every 24 hours 06/27/20 0733     06/24/20 0200  piperacillin-tazobactam (ZOSYN) IVPB 3.375 g  Status:  Discontinued        3.375 g 12.5 mL/hr over 240 Minutes Intravenous Every 8 hours 06/23/20 1703 06/27/20 0733   06/23/20 1645  piperacillin-tazobactam (ZOSYN) IVPB 3.375 g        3.375 g 100 mL/hr over 30 Minutes Intravenous  Once 06/23/20 1638 06/23/20 1834   06/23/20 1615  cefTRIAXone (ROCEPHIN) 1 g in sodium chloride 0.9 % 100 mL IVPB  Status:  Discontinued        1 g 200 mL/hr over 30 Minutes Intravenous  Once 06/23/20 1607 06/23/20 1608       Objective: She feels very weak started nausea and increasing abdominal pain after eating this morning  Would like to stay another day and see Dr.  Benay Spice in the morning and then go home.  vitals: Today's Vitals   06/28/20 2258 06/28/20 2309 06/28/20 2358 06/29/20 0702  BP:  (!) 157/67  (!) 157/75  Pulse:    84  Resp:    20  Temp:    97.9 F (36.6 C)  TempSrc:    Oral  SpO2:    95%  Weight:      Height:      PainSc: 2   Asleep     Intake/Output Summary (Last 24 hours) at 06/29/2020 1104 Last data filed at 06/29/2020 0600 Gross per 24 hour  Intake 480 ml  Output 0 ml  Net 480 ml   Filed Weights   06/23/20 1206  Weight: 78.9 kg   Weight change:    Intake/Output from previous day: 07/10 0701 - 07/11 0700 In: 600 [P.O.:480; I.V.:120] Out: 0  Intake/Output this shift: No intake/output data recorded.  Examination:  General exam: AAO, bit confused  abt day, NAD, weak appearing. HEENT:Oral mucosa moist, Ear/Nose WNL grossly,dentition normal. Respiratory system: bilaterally CLEAR,no wheezing or crackles,no use of accessory muscle, non tender. Cardiovascular system: S1 & S2 +, regular, No JVD. Gastrointestinal system: Abdomen soft, mild epigastric tenderness,ND, BS+. Nervous System:Alert, awake, moving extremities and grossly nonfocal Extremities: No edema, distal peripheral pulses palpable.  Skin: No rashes,no icterus. MSK: Normal muscle bulk,tone, power  Data Reviewed: I have personally reviewed following labs and imaging studies CBC: Recent Labs  Lab 06/23/20 1411 06/24/20 0454 06/26/20 0346  WBC 6.5 6.3 6.3  NEUTROABS 2.8  --   --   HGB 12.5 11.4* 12.0  HCT 36.7 34.7* 35.6*  MCV 103.4* 104.2* 103.2*  PLT 142* 121* 616*   Basic Metabolic Panel: Recent Labs  Lab 06/23/20 1411 06/24/20 0454 06/26/20 0346 06/28/20 0818 06/29/20 0822  NA 141 138 136 133* 136  K 5.1 4.3 3.8 3.3* 3.5  CL 104 103 99 96* 97*  CO2 25 26 25 28 29   GLUCOSE 105* 105* 98 106* 121*  BUN 18 15 11  7* 8  CREATININE 1.06* 0.95 0.84 0.70 0.67  CALCIUM 9.1 8.5* 8.1* 7.8* 7.9*   GFR: Estimated Creatinine Clearance: 58.8 mL/min  (by C-G formula based on SCr of 0.67 mg/dL). Liver Function Tests: Recent Labs  Lab 06/23/20 1411 06/24/20 0454 06/26/20 0346 06/28/20 0818  AST 31 29 49* 102*  ALT 11 9 20  33  ALKPHOS 98 90 133* 136*  BILITOT 1.0 1.0 1.1 1.1  PROT 6.5 6.0* 5.8* 6.2*  ALBUMIN 3.6 3.3* 3.2* 3.5   Recent Labs  Lab 06/23/20 1411  LIPASE 19   No results for input(s): AMMONIA in the last 168 hours. Coagulation Profile: No results for input(s): INR, PROTIME in the last 168 hours. Cardiac Enzymes: No results for input(s): CKTOTAL, CKMB, CKMBINDEX, TROPONINI in the last 168 hours. BNP (last 3 results) No results for input(s): PROBNP in the last 8760 hours. HbA1C: No results for input(s): HGBA1C in the last 72 hours. CBG: No results for input(s): GLUCAP in the last 168 hours. Lipid Profile: No results for input(s): CHOL, HDL, LDLCALC, TRIG, CHOLHDL, LDLDIRECT in the last 72 hours. Thyroid Function Tests: No results for input(s): TSH, T4TOTAL, FREET4, T3FREE, THYROIDAB in the last 72 hours. Anemia Panel: No results for input(s): VITAMINB12, FOLATE, FERRITIN, TIBC, IRON, RETICCTPCT in the last 72 hours. Sepsis Labs: Recent Labs  Lab 06/23/20 1411  LATICACIDVEN 0.7    Recent Results (from the past 240 hour(s))  Urine culture     Status: Abnormal   Collection Time: 06/23/20  2:11 PM   Specimen: Urine, Random  Result Value Ref Range Status   Specimen Description   Final    URINE, RANDOM Performed at [employer] [facility] [address], [place], [place] [address]    Special Requests   Final    NONE Performed at [facility], [place] [address] [place], [place] [address]    Culture >=100,000 COLONIES/mL ESCHERICHIA COLI (A)  Final   Report Status 06/26/2020 FINAL  Final   Organism ID, Bacteria ESCHERICHIA COLI (A)  Final      Susceptibility   Escherichia coli - MIC*    AMPICILLIN >=32 RESISTANT Resistant     CEFAZOLIN <=4 SENSITIVE Sensitive     CEFTRIAXONE  <=0.25 SENSITIVE Sensitive     CIPROFLOXACIN <=0.25 SENSITIVE Sensitive     GENTAMICIN <=1 SENSITIVE Sensitive     IMIPENEM <=0.25 SENSITIVE Sensitive     NITROFURANTOIN <=16  SENSITIVE Sensitive     TRIMETH/SULFA <=20 SENSITIVE Sensitive     AMPICILLIN/SULBACTAM >=32 RESISTANT Resistant     PIP/TAZO <=4 SENSITIVE Sensitive     * >=100,000 COLONIES/mL ESCHERICHIA COLI  C Difficile Quick Screen w PCR reflex     Status: None   Collection Time: 06/23/20  4:26 PM   Specimen: Nasopharyngeal Swab; Stool  Result Value Ref Range Status   C Diff antigen NEGATIVE NEGATIVE Final   C Diff toxin NEGATIVE NEGATIVE Final   C Diff interpretation No C. difficile detected.  Final    Comment: Performed at [facility], [place] [address], [place], [place] [address]  Blood culture (routine x 2)     Status: None   Collection Time: 06/23/20  5:58 PM   Specimen: BLOOD  Result Value Ref Range Status   Specimen Description   Final    BLOOD SITE NOT SPECIFIED Performed at [facility], [address] [place], [place] [address]    Special Requests   Final    BOTTLES DRAWN AEROBIC AND ANAEROBIC Blood Culture adequate volume Performed at [facility] [address] [place], [place] [address]    Culture   Final    NO GROWTH 5 DAYS Performed at [facility], [place] [address] [place], [place] [address]    Report Status 06/28/2020 FINAL  Final  Blood culture (routine x 2)     Status: None   Collection Time: 06/23/20  5:58 PM   Specimen: BLOOD  Result Value Ref Range Status   Specimen Description   Final    BLOOD SITE NOT SPECIFIED Performed at [facility] [address], [place], [place] [address]    Special Requests   Final    BOTTLES DRAWN AEROBIC AND ANAEROBIC Blood Culture adequate volume Performed at [facility] [address], [place], [place] [address]    Culture   Final    NO GROWTH 5 DAYS Performed at [facility], [place] [address], [place], [place] [address]    Report Status 06/28/2020 FINAL  Final  SARS Coronavirus 2 by RT PCR (hospital order, performed in [facility] hospital lab) Nasopharyngeal Nasopharyngeal Swab     Status: None   Collection Time: 06/23/20  5:58 PM   Specimen: Nasopharyngeal Swab  Result Value Ref Range Status   SARS Coronavirus 2 NEGATIVE NEGATIVE Final    Comment: (NOTE) SARS-CoV-2 target nucleic acids are NOT DETECTED.  The SARS-CoV-2 RNA is generally detectable in upper and lower respiratory specimens during the acute phase of infection. The lowest concentration of SARS-CoV-2 viral copies this assay can detect is 250 copies / mL. A negative result does not preclude SARS-CoV-2 infection and should not be used as the sole basis for treatment or other patient management decisions.  A negative result may occur with improper specimen collection / handling, submission of specimen other than nasopharyngeal swab, presence of viral mutation(s) within the areas targeted by this assay, and inadequate number of viral copies (<250 copies / mL). A negative result must be combined with clinical observations, patient history, and epidemiological information.  Fact Sheet for Patients:   StrictlyIdeas.no  Fact Sheet for Healthcare Providers: BankingDealers.co.za  This test is not yet approved or  cleared by the [place] FDA and has been authorized for detection and/or diagnosis of SARS-CoV-2 by FDA under an Emergency Use Authorization (EUA).  This EUA will remain in effect (meaning this test  can be used) for the duration of the COVID-19 declaration under Section 564(b)(1) of the Act, 21 U.S.C. section 360bbb-3(b)(1), unless the authorization is terminated or revoked sooner.  Performed at [facility], [place] [address], [place], [place] [address]       Radiology Studies: No results found.   LOS: 5  days   Georgette Shell, MD [facility]  06/29/2020, 11:04 AM

## 2020-06-29 NOTE — Plan of Care (Signed)

## 2020-06-29 NOTE — Progress Notes (Signed)
Pt attempted to eat dinner but vomited unmeasured amt in bed/floor. "I smelled the food and lost it," stated pt. Cleaned pt up and offered nausea med yet refused stating she felt fine now. Back to bed. Provided warm blanket. Reassurance given.

## 2020-06-30 ENCOUNTER — Inpatient Hospital Stay: Payer: Medicare PPO | Admitting: Nurse Practitioner

## 2020-06-30 MED ORDER — PANTOPRAZOLE SODIUM 40 MG IV SOLR
40.0000 mg | Freq: Every day | INTRAVENOUS | Status: DC
  Administered 2020-07-01 – 2020-07-02 (×2): 40 mg via INTRAVENOUS
  Filled 2020-06-30 (×2): qty 40

## 2020-06-30 NOTE — Progress Notes (Signed)
Physical Therapy Treatment Patient Details Name: Tracy Mclaughlin MRN: [id number] DOB: 1934-03-10 Today's Date: 06/30/2020    History of Present Illness 86 yo female admitted with N/V/D, abd pain, UTI, ascites. Hx of NHL, hypothyroidism, A fib, anemia, OA, PE, SAH    PT Comments    Pt progressing slowly. Continues to fatigue rapidly with mobility/gait. May need w/c for home if she is agreeable   Follow Up Recommendations  Other (comment) (needs HHPT vs SNF, declines both)     Equipment Recommendations  Other (comment) (may need w/c )    Recommendations for Other Services       Precautions / Restrictions Precautions Precautions: Fall Restrictions Weight Bearing Restrictions: No    Mobility  Bed Mobility Overal bed mobility: Needs Assistance Bed Mobility: Supine to Sit     Supine to sit: Min assist     General bed mobility comments: light assist to elevate  trunk  Transfers Overall transfer level: Needs assistance Equipment used: Rolling walker (2 wheeled) Transfers: Sit to/from Stand Sit to Stand: Mod assist;From elevated surface         General transfer comment: cues for hand placement,  assist with anterior-superior wt shift   Ambulation/Gait Ambulation/Gait assistance: Min assist Gait Distance (Feet): 20 Feet Assistive device: Rolling walker (2 wheeled) Gait Pattern/deviations: Step-through pattern;Decreased stride length;Trunk flexed Gait velocity: decr   General Gait Details: pt with incr WOB, 3/4 DOE. SpO2=94% on RA.  Pt fatigues rapidly, chair needed for seated rest    Stairs             Wheelchair Mobility    Modified Rankin (Stroke Patients Only)       Balance           Standing balance support: Bilateral upper extremity supported Standing balance-Leahy Scale: Poor                              Cognition Arousal/Alertness: Awake/alert Behavior During Therapy: WFL for tasks assessed/performed Overall  Cognitive Status: Within Functional Limits for tasks assessed                                        Exercises      General Comments        Pertinent Vitals/Pain Pain Assessment: No/denies pain    Home Living                      Prior Function            PT Goals (current goals can now be found in the care plan section) Acute Rehab PT Goals Patient Stated Goal: home soon PT Goal Formulation: With patient Time For Goal Achievement: 07/09/20 Potential to Achieve Goals: Good Progress towards PT goals: Progressing toward goals    Frequency    Min 3X/week      PT Plan Current plan remains appropriate    Co-evaluation              AM-PAC PT "6 Clicks" Mobility   Outcome Measure  Help needed turning from your back to your side while in a flat bed without using bedrails?: A Little Help needed moving from lying on your back to sitting on the side of a flat bed without using bedrails?: A Little Help needed moving to and from a bed to a  chair (including a wheelchair)?: A Little Help needed standing up from a chair using your arms (e.g., wheelchair or bedside chair)?: A Lot Help needed to walk in hospital room?: A Little Help needed climbing 3-5 steps with a railing? : A Lot 6 Click Score: 16    End of Session Equipment Utilized During Treatment: Gait belt Activity Tolerance: Patient limited by fatigue Patient left: in chair;with call bell/phone within reach;with chair alarm set   PT Visit Diagnosis: Unsteadiness on feet (R26.81);Muscle weakness (generalized) (M62.81)     Time: 7494-4967 PT Time Calculation (min) (ACUTE ONLY): 17 min  Charges:  $Gait Training: 8-22 mins                     Baxter Flattery, PT  Acute Rehab Dept ([place]) [phone] Pager [phone]  06/30/2020    [facility] 06/30/2020, 3:00 PM

## 2020-06-30 NOTE — Progress Notes (Signed)
NT alert the RN about patient's low oxygen level on  80's  room air  denies chest pain or shortness of breathing, patient was instructed to take deep breaths and  bed was prompt to semi fowlers but oxygen level would just go up to 88% - 89 %. Patient was put on nasal cannula @ 2L ,oxygen level went up to 92% . We will continue to monitor.

## 2020-06-30 NOTE — Progress Notes (Addendum)
HEMATOLOGY-ONCOLOGY PROGRESS NOTE  SUBJECTIVE: Mildly confused this morning.  No recurrent diarrhea.  Still has nausea.  Ate 1 bite of breakfast and vomited after that.  Noted to have multiple bruises on her arms.  Oncology History  Non Hodgkin's lymphoma ([place])  09/29/2016 Initial Diagnosis   Non Hodgkin's lymphoma ([place])   03/22/2019 - 04/23/2019 Chemotherapy   The patient had palonosetron (ALOXI) injection 0.25 mg, 0.25 mg, Intravenous,  Once, 1 of 4 cycles Administration: 0.25 mg (03/22/2019) riTUXimab (RITUXAN) 800 mg in sodium chloride 0.9 % 250 mL (2.4242 mg/mL) infusion, 375 mg/m2 = 800 mg, Intravenous,  Once, 1 of 4 cycles Administration: 800 mg (03/22/2019) bendamustine (BENDEKA) 150 mg in sodium chloride 0.9 % 50 mL (2.6786 mg/mL) chemo infusion, 70 mg/m2 = 150 mg (100 % of original dose 70 mg/m2), Intravenous,  Once, 1 of 4 cycles Dose modification: 70 mg/m2 (original dose 70 mg/m2, Cycle 1, Reason: Provider Judgment) Administration: 150 mg (03/22/2019), 150 mg (03/23/2019)  for chemotherapy treatment.    05/18/2019 - 06/28/2019 Chemotherapy   The patient had DOXOrubicin (ADRIAMYCIN) chemo injection 82 mg, 40 mg/m2 = 82 mg (100 % of original dose 40 mg/m2), Intravenous,  Once, 2 of 6 cycles Dose modification: 40 mg/m2 (original dose 40 mg/m2, Cycle 1, Reason: Provider Judgment) Administration: 82 mg (05/18/2019), 82 mg (06/08/2019) palonosetron (ALOXI) injection 0.25 mg, 0.25 mg, Intravenous,  Once, 2 of 6 cycles Administration: 0.25 mg (05/18/2019), 0.25 mg (06/08/2019) pegfilgrastim (NEULASTA ONPRO KIT) injection 6 mg, 6 mg, Subcutaneous, Once, 2 of 6 cycles Administration: 6 mg (05/18/2019), 6 mg (06/08/2019) vinCRIStine (ONCOVIN) 1 mg in sodium chloride 0.9 % 50 mL chemo infusion, 1 mg (100 % of original dose 1 mg), Intravenous,  Once, 2 of 6 cycles Dose modification: 1 mg (original dose 1 mg, Cycle 1, Reason: Provider Judgment) Administration: 1 mg (05/18/2019), 1 mg (06/08/2019) riTUXimab  (RITUXAN) 800 mg in sodium chloride 0.9 % 250 mL (2.4242 mg/mL) infusion, 375 mg/m2 = 800 mg, Intravenous,  Once, 2 of 6 cycles Administration: 800 mg (05/18/2019), 800 mg (06/08/2019) cyclophosphamide (CYTOXAN) 1,240 mg in sodium chloride 0.9 % 250 mL chemo infusion, 600 mg/m2 = 1,540 mg, Intravenous,  Once, 2 of 6 cycles Dose modification: 600 mg/m2 (original dose 750 mg/m2, Cycle 2, Reason: Provider Judgment) Administration: 1,240 mg (05/18/2019), 1,240 mg (06/08/2019) fosaprepitant (EMEND) 150 mg, dexamethasone (DECADRON) 12 mg in sodium chloride 0.9 % 145 mL IVPB, , Intravenous,  Once, 2 of 6 cycles Administration:  (05/18/2019),  (06/08/2019)  for chemotherapy treatment.    07/19/2019 -  Chemotherapy   The patient had riTUXimab (RITUXAN) 700 mg in sodium chloride 0.9 % 250 mL (2.1875 mg/mL) infusion, 375 mg/m2 = 700 mg, Intravenous,  Once, 11 of 11 cycles Administration: 700 mg (07/19/2019), 700 mg (08/16/2019), 700 mg (09/13/2019), 700 mg (10/10/2019), 700 mg (11/07/2019), 700 mg (12/07/2019), 700 mg (01/04/2020), 700 mg (02/01/2020), 700 mg (03/05/2020), 700 mg (04/02/2020), 700 mg (04/30/2020) riTUXimab-pvvr (RUXIENCE) 700 mg in sodium chloride 0.9 % 250 mL (2.1875 mg/mL) infusion, 375 mg/m2 = 700 mg (original dose ), Intravenous,  Once, 0 of 1 cycle Dose modification: 375 mg/m2 (Cycle 12)  for chemotherapy treatment.     PHYSICAL EXAMINATION:  Vitals:   06/29/20 2055 06/30/20 0628  BP: (!) 163/70 (!) 142/60  Pulse: 87 87  Resp: 17 16  Temp: 98.7 F (37.1 C) 98.4 F (36.9 C)  SpO2: 93% 92%   Filed Weights   06/23/20 1206  Weight: 78.9 kg  Intake/Output from previous day: 07/11 0701 - 07/12 0700 In: 350 [I.V.:150; IV Piggyback:200] Out: -   GENERAL:alert, no distress and comfortable HEENT: No thrush LYMPH: Soft mobile 1 cm cervical and supraclavicular nodes on the left LUNGS: clear to auscultation and percussion with normal breathing effort, decreased breath sounds at the left  lower posterior chest HEART: regular rate & rhythm and no murmurs and no lower extremity edema SKIN: Multiple ecchymoses noted on her bilateral arms NEURO: alert & oriented x 3 with fluent speech, no focal motor/sensory deficits  LABORATORY DATA:  I have reviewed the data as listed CMP Latest Ref Rng & Units 06/29/2020 06/28/2020 06/26/2020  Glucose 70 - 99 mg/dL 121(H) 106(H) 98  BUN 8 - 23 mg/dL 8 7(L) 11  Creatinine 0.44 - 1.00 mg/dL 0.67 0.70 0.84  Sodium 135 - 145 mmol/L 136 133(L) 136  Potassium 3.5 - 5.1 mmol/L 3.5 3.3(L) 3.8  Chloride 98 - 111 mmol/L 97(L) 96(L) 99  CO2 22 - 32 mmol/L '29 28 25  ' Calcium 8.9 - 10.3 mg/dL 7.9(L) 7.8(L) 8.1(L)  Total Protein 6.5 - 8.1 g/dL - 6.2(L) 5.8(L)  Total Bilirubin 0.3 - 1.2 mg/dL - 1.1 1.1  Alkaline Phos 38 - 126 U/L - 136(H) 133(H)  AST 15 - 41 U/L - 102(H) 49(H)  ALT 0 - 44 U/L - 33 20    Lab Results  Component Value Date   WBC 6.3 06/26/2020   HGB 12.0 06/26/2020   HCT 35.6 (L) 06/26/2020   MCV 103.2 (H) 06/26/2020   PLT 143 (L) 06/26/2020   NEUTROABS 2.8 06/23/2020    DG Chest 2 View  Result Date: 06/26/2020 CLINICAL DATA:  Dyspnea. EXAM: CHEST - 2 VIEW COMPARISON:  Chest CT 05/22/2020 FINDINGS: Right chest port remains in place. Hazy opacity at the left lung base consistent with pleural effusion and airspace disease/atelectasis. Right lung is clear. Upper normal heart size. Mild right hilar prominence likely secondary to combination of vascular structures and adenopathy is seen on recent CT. Fullness in the upper mediastinum likely secondary to adenopathy. No pneumothorax. No pulmonary edema. Surgical clips in the left axilla. IMPRESSION: 1. Hazy opacity at the left lung base consistent with pleural effusion and airspace disease/atelectasis. 2. Mild right hilar prominence likely secondary to combination of vascular structures and adenopathy seen on recent CT. Electronically Signed   By: Keith Rake M.D.   On: 06/26/2020 15:54    CT Abdomen Pelvis W Contrast  Result Date: 06/23/2020 CLINICAL DATA:  86 year old female with abdominal pain, nausea and vomiting. History of lymphoma. EXAM: CT ABDOMEN AND PELVIS WITH CONTRAST TECHNIQUE: Multidetector CT imaging of the abdomen and pelvis was performed using the standard protocol following bolus administration of intravenous contrast. CONTRAST:  79m OMNIPAQUE IOHEXOL 300 MG/ML  SOLN COMPARISON:  CT of the chest abdomen pelvis dated 05/22/2020. FINDINGS: Lower chest: Partially visualized moderate left and trace right pleural effusions, new since the prior CT. There is associated partial compressive atelectasis of the left lower lobe. Pneumonia is not excluded. Clinical correlation is recommended. No intra-abdominal free air. Interval development of a small ascites, new or significantly increased since the prior CT. Hepatobiliary: A 1 cm hypodense focus along the posterior liver capsule is not characterized but may represent a cyst or focal area of scarring. This is similar to prior CT. Subcentimeter hypodense focus in the anterior liver is too small to characterize. There is mild intrahepatic biliary ductal dilatation, likely post cholecystectomy. The common bile duct is dilated measuring  14 mm. No retained calcified stone noted in the central CBD. Pancreas: The pancreas is unremarkable as visualized. Spleen: Mildly enlarged spleen measuring 15 cm in craniocaudal length. Adrenals/Urinary Tract: The adrenal glands are poorly visualized but grossly unremarkable. There is mild fullness of the renal collecting systems bilaterally without frank hydronephrosis. Mild hazy appearance of the urothelium noted. Correlation with urinalysis recommended to exclude UTI. There is symmetric enhancement and excretion of contrast by both kidneys. Subcentimeter left renal hypodense focus is not characterized. The urinary bladder is partially distended. Probable chronic bladder wall thickening and perivesical  stranding. Correlation with urinalysis recommended to exclude UTI. Stomach/Bowel: There is sigmoid diverticulosis without definite active inflammatory changes. There is postsurgical changes of bowel with anastomotic suture in the right lower quadrant. No definite evidence of bowel obstruction. Evaluation however is limited in the absence of oral contrast. Vascular/Lymphatic: There is moderate aortoiliac atherosclerotic disease. The IVC is unremarkable as visualized. The SMV, splenic vein, and main portal vein are patent. No portal venous gas. Bulky retroperitoneal adenopathy encasing the abdominal aorta and IVC as well as enlarged bilateral iliac chain lymph nodes in keeping with history of lymphoma. Overall the degree of adenopathy is relatively similar to prior CT. Reproductive: The uterus is poorly visualized. Other: Diffuse mesenteric edema, new since the prior CT. There is scattered mesenteric adenopathy as seen previously. Musculoskeletal: Degenerative changes of the spine. Minimal compression fracture of the anterior superior and inferior endplates of the L3, new since the prior CT, likely acute or subacute. IMPRESSION: 1. Interval development of partially visualized moderate left and trace right pleural effusions, as well as development of diffuse mesenteric edema and ascites, new since the prior CT. 2. Bulky retroperitoneal and iliac chain adenopathy in keeping with history of lymphoma. Overall the degree of adenopathy is relatively similar to prior CT. 3. Mild splenomegaly. 4. Minimal compression fracture of the superior and inferior endplate of L3, likely acute or subacute. 5. Sigmoid diverticulosis. 6. Aortic Atherosclerosis (ICD10-I70.0). Electronically Signed   By: Anner Crete M.D.   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 Status post  weekly Rituxan x4 03/01/2012 through 03/22/2012. She completed 4 "maintenance" doses of Rituxan, last on 12/19/2012. A restaging CT on 02/09/2013 showed no evidence of lymphoma.   Lymph node lateral to the thyroid bed on a neck ultrasound 02/21/2014, status post an FNA biopsy concerning for a lymphoproliferative disorder.  PET scan 09/28/2016 with active lymphoma within the neck, chest, abdomen, pelvis; splenic enlargement and hypermetabolism suspicious for splenic involvement.  Initiation of Rituxan weekly 4 09/29/2016  Initiation of maintenance Rituxan on a 3 month schedule 12/23/2016; final Rituxan 08/31/2018  Thyroid ultrasound 02/07/2019-left cervical lymphadenopathy  PET scan 03/08/2019-extensive recurrent hypermetabolic lymphoma involving the neck, chest, abdomen and pelvis.  03/16/2019 left cervical lymph node biopsy-features consistent with previously diagnosed non-Hodgkin's B-cell lymphoma, phenotypically consistent with marginal zone lymphoma. Flow cytometry with lambda restricted B-cell population without expression of CD5 or CD10 comprising 87% of all lymphocytes.  Cycle 1 bendamustine/Rituxan 03/22/2019  Excision deep left axillary lymph nodes 05/04/2019-non-Hodgkin's B-cell lymphoma with differential including a marginal zone lymphoma and atypical small lymphocytic lymphoma. Flow cytometry with monoclonal B-cell population without expression of CD5 or CD10, comprises 96% of all lymphocytes.  Cycle 1 [facility]/Rituxan 05/18/2019  Cycle 2 [facility]/Rituxan 06/08/2019  CTs 06/15/2019-partial improvement in diffuse adenopathy, stable mild splenomegaly  Cycle 1 Revlimid/rituximab 07/19/2019 (Revlimid  start 07/20/2019)  Cycle 2 Revlimid/rituximab 08/16/2019 (Revlimid placed on hold 08/30/2019 due to neutropenia)  Cycle 3 of Revlimid/rituximab 09/13/2019 (Revlimid schedule changed to 14 days on/14 days off)  Cycle 4 Revlimid/rituximab 10/10/2019  Cycle 5 Revlimid/rituximab  11/07/2019  Cycle 6 Revlimid/rituximab 12/07/2019  CTs 12/28/2019-diffuse lymphadenopathy-slightly increased compared to 06/15/2019  Cycle 7 Revlimid/rituximab 01/04/2020  Cycle 8 Revlimid/Rituxan 02/01/2020  Cycle 9 Revlimid/Rituxan 03/05/2020  Cycle 10 Revlimid/Rituxan 04/02/2020  Cycle 11 Revlimid/Rituxan 04/30/2020  CT 05/22/2020-mild increase in left supraclavicular adenopathy, mild increase in chest, retroperitoneal, and pelvic adenopathy. Stable mild splenomegaly.  Acalabrutinib started 06/19/2020  CT abdomen/pelvis 06/23/2020-moderate left and trace right pleural effusions, new diffuse mesenteric edema and ascites, stable retroperitoneal and iliac adenopathy, mild splenomegaly 2. Stage IV (T1bN1b M0) papillary thyroid cancer, status post a thyroidectomy with reimplantation of the left superior parathyroid gland on 05/23/2012, status post radioactive iodine therapy, followed by Dr. Buddy Duty.  3. Stage II (T3 N0) colon cancer, status post a right colectomy 10/19/2011, last colonoscopy April 2015-sigmoid adenoma removed.  4. History of a pulmonary embolism December 2012.  5. History of Atrial fibrillation 6. Iron deficiency anemia-new 03/18/2014. Hemoccult positive stool. The anemia corrected with iron. No longer taking iron.  Status post an upper endoscopy and colonoscopy by Dr. Carlean Purl April 2015 with no bleeding source identified, benign adenoma removed from the sigmoid colon. 7. Report of an upper gastric intestinal bleed fall 2016-managed in [place]. [name] 8. Left knee replacement May 2017, repeat left knee surgery May 2018 9. Pruritic rash 07/22/2016 10. Nausea and diarrhea 04/02/2019-stool negative for C. difficile toxin 11. 06/18/2019 [facility] admission for symptomatic anemia. 12.  Admission 06/23/2020 with nausea and diarrhea  Ms. Rape appears partially improved.  She has been restarted on [place] with no recurrent diarrhea.  She has persistent nausea with occasional vomiting.  The  etiology of her symptoms remains unclear.  I suspect her symptoms are related to lymphoma as opposed to toxicity from the acalabrutinib.  She was started on prednisone 10 mg daily last week for failure to thrive.  Noted to have increased bruising this morning.  Recommendations: 1.  Continue acalabrutinib 2.  Continue as needed antiemetics 3.  Continue prednisone 10 mg daily for 5 to thrive and anorexia 4.  Discontinue heparin due to increased bruising.  SCDs have been ordered.    LOS: 6 days   Mikey Bussing 06/30/20 Ms. Worst was interviewed and examined.  She appeared confused when I saw her over this morning.  She has developed multiple ecchymoses over the trunk and extremities.  The ecchymoses are likely related to a combination of alcohol ibrutinib and heparin.  I recommend stopping heparin.  The palpable lymph nodes are significantly smaller.  She is not ready for discharge this morning.  I attempted to call her husband, but there was no answer.   She can be discharged to home when she is ambulatory.

## 2020-06-30 NOTE — Care Management Important Message (Signed)
Important Message  Patient Details IM Letter given to [place] Case Manager to present to the Patient Name: Tracy Mclaughlin MRN: [id number] Date of Birth: 11/30/1934   Medicare Important Message Given:  Yes     Kerin Salen 06/30/2020, 12:37 PM

## 2020-06-30 NOTE — Progress Notes (Signed)
PROGRESS NOTE    Tracy Mclaughlin  DVV:[id number] DOB: [birth year] DOA: 06/23/2020 PCP: [name], PA   Chef Complaints: nausea  Brief Narrative: 86 y.o.femalewith medical history significant forlow-grade lymphoma without significant response to chemotherapy last year and was recently started onBTK INHIBITORby Dr. [name], hypothyroidism, history of A. fib, pancytopenia presents to the ED with3-4 day history of nausea vomiting and diarrhea and abdominal pain-and right upper quadrant.symptomts started after new chemo on Thursday.Patient denies any fever chest pain.vomitted x3 today and had non bloody diarrhea 4 times today.  ED Course:Hemodynamically stable, afebrile, UA positive for UTI,CT scan abdomen done that showed free fluid with ascites and mesenteric edema, case was discussed Dr. [name] from oncology advised UTI treatment with Zosyn and he will see the patient in the morning.  Patient was admitted, was managed symptomatically chemo was held diarrhea improved subsequently oncology put the patient back on chemo overall tolerating well. Having some nausea anorexia suspecting this is related to her lymphoma. Patient has been started on prednisone to help with this. Seen by physical therapy would benefit with home health PT but patient declined, if she agrees we can arrange home health PT upon discharge.  Subjective:  Ate bite of breakfast this am and vomited after that Had BM this am on RA epigastric abd pain as before  Assessment & Plan:  Intractable nausea vomitingdiarrhea andabdominal pain: Unclear etiology suspecting due to lymphoma.  Chemo- Acalabrutinib was held on admission and resumed 7/8. Complaints of nausea difficulty eating and vomiting this morning after eating.  She has been placed on prednisone to help with appetite.  Pepcid was added to manage symptoms.  Remains nauseous intermittently vomiting.  Patient having failure to thrive.  Shortness of  breath-no complaints.  Continue symptomatic management breathing treatments steroid   E coli UTI POA: We will complete 7 days of ceftriaxone.    Non Hodgkin's lymphoma-with progressive decline, patientwasstartedpast weekonAcalabrutinib she did not have significant response to chemotherapy last year.  Acalabrutinib was held on admission and resumed 7/8.LDH at 285. started on prednisone per onco. Cont pt/ot dietitian eval  Failure to thrive/deconditioning continue nutrition: Continue PT OT dietitian eval.  Placed on steroids to help with appetite but still patient nauseous.  Hypertension: BP controlled on Cardizem.   Pancytopenia anemia mild thrombocytopenia in the setting of lymphoma.  CBC is overall stable.  History of intermittent atrial fibrillation on Cardizem, not on anticoagulation at home.  Bruise on the skin heparin discontinued per oncology.  DVT prophylaxis: Place and maintain sequential compression device Start: 06/30/20 0901 Code Status: DNR Family Communication: plan of care discussed with patient at bedside and Tracy Mclaughlin was updated previously.    Status is: Inpatient  Remains inpatient appropriate because: Patient remains symptomatic, nauseous, with failure to thrive.  Dispo: The patient is from: Home              Anticipated d/c is to: TBD              Anticipated d/c date is: 1-2 days              Patient currently is not medically stable to d/c.  Remains hospitalized for ongoing nausea vomiting.  Oncology following and will await for further recommendation.  Await for diet tolerance.   Nutrition: Diet Order            Diet regular Room service appropriate? Yes; Fluid consistency: Thin  Diet effective now  Body mass index is 26.46 kg/m.  Consultants:see note  Procedures:see note Microbiology:see note  Medications: Scheduled Meds: . acalabrutinib  100 mg Oral Daily  . Chlorhexidine Gluconate Cloth  6 each Topical Daily    . citalopram  40 mg Oral Daily  . diltiazem  180 mg Oral QHS  . famotidine  20 mg Oral Daily  . levothyroxine  224 mcg Oral Q0600  . melatonin  3 mg Oral QHS  . predniSONE  10 mg Oral Q breakfast   Continuous Infusions: . sodium chloride 10 mL/hr at 06/29/20 0600  . cefTRIAXone (ROCEPHIN)  IV 1 g (06/30/20 0752)    Antimicrobials: Anti-infectives (From admission, onward)   Start     Dose/Rate Route Frequency Ordered Stop   06/27/20 0830  cefTRIAXone (ROCEPHIN) 1 g in sodium chloride 0.9 % 100 mL IVPB     Discontinue     1 g 200 mL/hr over 30 Minutes Intravenous Every 24 hours 06/27/20 0733     06/24/20 0200  piperacillin-tazobactam (ZOSYN) IVPB 3.375 g  Status:  Discontinued        3.375 g 12.5 mL/hr over 240 Minutes Intravenous Every 8 hours 06/23/20 1703 06/27/20 0733   06/23/20 1645  piperacillin-tazobactam (ZOSYN) IVPB 3.375 g        3.375 g 100 mL/hr over 30 Minutes Intravenous  Once 06/23/20 1638 06/23/20 1834   06/23/20 1615  cefTRIAXone (ROCEPHIN) 1 g in sodium chloride 0.9 % 100 mL IVPB  Status:  Discontinued        1 g 200 mL/hr over 30 Minutes Intravenous  Once 06/23/20 1607 06/23/20 1608       Objective: Vitals: Today's Vitals   06/29/20 1940 06/29/20 2055 06/30/20 0628 06/30/20 0744  BP:  (!) 163/70 (!) 142/60   Pulse:  87 87   Resp:  17 16   Temp:  98.7 F (37.1 C) 98.4 F (36.9 C)   TempSrc:  Oral Oral   SpO2:  93% 92%   Weight:      Height:      PainSc: 0-No pain   Asleep    Intake/Output Summary (Last 24 hours) at 06/30/2020 1023 Last data filed at 06/30/2020 0919 Gross per 24 hour  Intake 602.67 ml  Output 0 ml  Net 602.67 ml   Filed Weights   06/23/20 1206  Weight: 78.9 kg   Weight change:    Intake/Output from previous day: 07/11 0701 - 07/12 0700 In: 350 [I.V.:150; IV Piggyback:200] Out: -  Intake/Output this shift: Total I/O In: 273.2 [I.V.:73.2; IV Piggyback:200] Out: 0   Examination:  General exam: AAO X3, ill frail  elderly, HEENT:Oral mucosa moist, Ear/Nose WNL grossly, dentition normal. Respiratory system: bilaterally clear,no wheezing or crackles,no use of accessory muscle Cardiovascular system: S1 & S2 +, No JVD,. Gastrointestinal system: Abdomen soft, mildly tender epigastrium ND, BS+ Nervous System:Alert, awake, moving extremities and grossly nonfocal Extremities: No edema, distal peripheral pulses palpable.  Skin: No rashes,no icterus. MSK: Normal muscle bulk,tone, power  Data Reviewed: I have personally reviewed following labs and imaging studies CBC: Recent Labs  Lab 06/23/20 1411 06/24/20 0454 06/26/20 0346  WBC 6.5 6.3 6.3  NEUTROABS 2.8  --   --   HGB 12.5 11.4* 12.0  HCT 36.7 34.7* 35.6*  MCV 103.4* 104.2* 103.2*  PLT 142* 121* 161*   Basic Metabolic Panel: Recent Labs  Lab 06/23/20 1411 06/24/20 0454 06/26/20 0346 06/28/20 0818 06/29/20 0822  NA 141 138 136 133* 136  K  5.1 4.3 3.8 3.3* 3.5  CL 104 103 99 96* 97*  CO2 25 26 25 28 29   GLUCOSE 105* 105* 98 106* 121*  BUN 18 15 11  7* 8  CREATININE 1.06* 0.95 0.84 0.70 0.67  CALCIUM 9.1 8.5* 8.1* 7.8* 7.9*   GFR: Estimated Creatinine Clearance: 58.8 mL/min (by C-G formula based on SCr of 0.67 mg/dL). Liver Function Tests: Recent Labs  Lab 06/23/20 1411 06/24/20 0454 06/26/20 0346 06/28/20 0818  AST 31 29 49* 102*  ALT 11 9 20  33  ALKPHOS 98 90 133* 136*  BILITOT 1.0 1.0 1.1 1.1  PROT 6.5 6.0* 5.8* 6.2*  ALBUMIN 3.6 3.3* 3.2* 3.5   Recent Labs  Lab 06/23/20 1411  LIPASE 19   No results for input(s): AMMONIA in the last 168 hours. Coagulation Profile: No results for input(s): INR, PROTIME in the last 168 hours. Cardiac Enzymes: No results for input(s): CKTOTAL, CKMB, CKMBINDEX, TROPONINI in the last 168 hours. BNP (last 3 results) No results for input(s): PROBNP in the last 8760 hours. HbA1C: No results for input(s): HGBA1C in the last 72 hours. CBG: No results for input(s): GLUCAP in the last 168  hours. Lipid Profile: No results for input(s): CHOL, HDL, LDLCALC, TRIG, CHOLHDL, LDLDIRECT in the last 72 hours. Thyroid Function Tests: No results for input(s): TSH, T4TOTAL, FREET4, T3FREE, THYROIDAB in the last 72 hours. Anemia Panel: No results for input(s): VITAMINB12, FOLATE, FERRITIN, TIBC, IRON, RETICCTPCT in the last 72 hours. Sepsis Labs: Recent Labs  Lab 06/23/20 1411  LATICACIDVEN 0.7    Recent Results (from the past 240 hour(s))  Urine culture     Status: Abnormal   Collection Time: 06/23/20  2:11 PM   Specimen: Urine, Random  Result Value Ref Range Status   Specimen Description   Final    URINE, RANDOM Performed at [facility] [address], [place], [place] [address]    Special Requests   Final    NONE Performed at [facility], [place] [address], [place], [place] [address]    Culture >=100,000 COLONIES/mL ESCHERICHIA COLI (A)  Final   Report Status 06/26/2020 FINAL  Final   Organism ID, Bacteria ESCHERICHIA COLI (A)  Final      Susceptibility   Escherichia coli - MIC*    AMPICILLIN >=32 RESISTANT Resistant     CEFAZOLIN <=4 SENSITIVE Sensitive     CEFTRIAXONE <=0.25 SENSITIVE Sensitive     CIPROFLOXACIN <=0.25 SENSITIVE Sensitive     GENTAMICIN <=1 SENSITIVE Sensitive     IMIPENEM <=0.25 SENSITIVE Sensitive     NITROFURANTOIN <=16 SENSITIVE Sensitive     TRIMETH/SULFA <=20 SENSITIVE Sensitive     AMPICILLIN/SULBACTAM >=32 RESISTANT Resistant     PIP/TAZO <=4 SENSITIVE Sensitive     * >=100,000 COLONIES/mL ESCHERICHIA COLI  C Difficile Quick Screen w PCR reflex     Status: None   Collection Time: 06/23/20  4:26 PM   Specimen: Nasopharyngeal Swab; Stool  Result Value Ref Range Status   C Diff antigen NEGATIVE NEGATIVE Final   C Diff toxin NEGATIVE NEGATIVE Final   C Diff interpretation No C. difficile detected.  Final    Comment: Performed at [facility], [place] [address], [place], [place]  [address]  Blood culture (routine x 2)     Status: None   Collection Time: 06/23/20  5:58 PM   Specimen: BLOOD  Result Value Ref Range Status   Specimen Description   Final  BLOOD SITE NOT SPECIFIED Performed at [facility], [place] [address], [place], [place] [address]    Special Requests   Final    BOTTLES DRAWN AEROBIC AND ANAEROBIC Blood Culture adequate volume Performed at [facility] [address] [place], [place] [address]    Culture   Final    NO GROWTH 5 DAYS Performed at [facility], [place] [address] [place], [place] [address]    Report Status 06/28/2020 FINAL  Final  Blood culture (routine x 2)     Status: None   Collection Time: 06/23/20  5:58 PM   Specimen: BLOOD  Result Value Ref Range Status   Specimen Description   Final    BLOOD SITE NOT SPECIFIED Performed at [facility] [address], [place], [place] [address]    Special Requests   Final    BOTTLES DRAWN AEROBIC AND ANAEROBIC Blood Culture adequate volume Performed at [facility] [address] [place], [place] [address]    Culture   Final    NO GROWTH 5 DAYS Performed at [facility], [place] [address], [place], [place] [address]    Report Status 06/28/2020 FINAL  Final  SARS Coronavirus 2 by RT PCR (hospital order, performed in [facility] hospital lab) Nasopharyngeal Nasopharyngeal Swab     Status: None   Collection Time: 06/23/20  5:58 PM   Specimen: Nasopharyngeal Swab  Result Value Ref Range Status   SARS Coronavirus 2 NEGATIVE NEGATIVE Final    Comment: (NOTE) SARS-CoV-2 target nucleic acids are NOT DETECTED.  The SARS-CoV-2 RNA is generally detectable in upper and lower respiratory specimens during the acute phase of infection. The lowest concentration of SARS-CoV-2 viral copies this assay can detect is 250 copies / mL. A negative result does not preclude SARS-CoV-2 infection and should not be used as the sole basis for  treatment or other patient management decisions.  A negative result may occur with improper specimen collection / handling, submission of specimen other than nasopharyngeal swab, presence of viral mutation(s) within the areas targeted by this assay, and inadequate number of viral copies (<250 copies / mL). A negative result must be combined with clinical observations, patient history, and epidemiological information.  Fact Sheet for Patients:   StrictlyIdeas.no  Fact Sheet for Healthcare Providers: BankingDealers.co.za  This test is not yet approved or  cleared by the [place] FDA and has been authorized for detection and/or diagnosis of SARS-CoV-2 by FDA under an Emergency Use Authorization (EUA).  This EUA will remain in effect (meaning this test can be used) for the duration of the COVID-19 declaration under Section 564(b)(1) of the Act, 21 U.S.C. section 360bbb-3(b)(1), unless the authorization is terminated or revoked sooner.  Performed at [facility], [place] [address] [place], [place] [address]       Radiology Studies: No results found.   LOS: 6 days   Antonieta Pert, MD [facility]  06/30/2020, 10:23 AM

## 2020-07-01 ENCOUNTER — Inpatient Hospital Stay (HOSPITAL_COMMUNITY): Payer: Medicare PPO

## 2020-07-01 MED ORDER — LIDOCAINE HCL 1 % IJ SOLN
INTRAMUSCULAR | Status: AC
  Filled 2020-07-01: qty 20

## 2020-07-01 NOTE — Progress Notes (Addendum)
HEMATOLOGY-ONCOLOGY PROGRESS NOTE  SUBJECTIVE: Up in the recliner at the time of my visit. Attempting to climb out of the recliner. Wants to go back to bed. Denies nausea, vomiting, and diarrhea, but nursing reports that she has ongoing nausea. No family in the room.   Oncology History  Non Hodgkin's lymphoma ([place])  09/29/2016 Initial Diagnosis   Non Hodgkin's lymphoma ([place])   03/22/2019 - 04/23/2019 Chemotherapy   The patient had palonosetron (ALOXI) injection 0.25 mg, 0.25 mg, Intravenous,  Once, 1 of 4 cycles Administration: 0.25 mg (03/22/2019) riTUXimab (RITUXAN) 800 mg in sodium chloride 0.9 % 250 mL (2.4242 mg/mL) infusion, 375 mg/m2 = 800 mg, Intravenous,  Once, 1 of 4 cycles Administration: 800 mg (03/22/2019) bendamustine (BENDEKA) 150 mg in sodium chloride 0.9 % 50 mL (2.6786 mg/mL) chemo infusion, 70 mg/m2 = 150 mg (100 % of original dose 70 mg/m2), Intravenous,  Once, 1 of 4 cycles Dose modification: 70 mg/m2 (original dose 70 mg/m2, Cycle 1, Reason: Provider Judgment) Administration: 150 mg (03/22/2019), 150 mg (03/23/2019)  for chemotherapy treatment.    05/18/2019 - 06/28/2019 Chemotherapy   The patient had DOXOrubicin (ADRIAMYCIN) chemo injection 82 mg, 40 mg/m2 = 82 mg (100 % of original dose 40 mg/m2), Intravenous,  Once, 2 of 6 cycles Dose modification: 40 mg/m2 (original dose 40 mg/m2, Cycle 1, Reason: Provider Judgment) Administration: 82 mg (05/18/2019), 82 mg (06/08/2019) palonosetron (ALOXI) injection 0.25 mg, 0.25 mg, Intravenous,  Once, 2 of 6 cycles Administration: 0.25 mg (05/18/2019), 0.25 mg (06/08/2019) pegfilgrastim (NEULASTA ONPRO KIT) injection 6 mg, 6 mg, Subcutaneous, Once, 2 of 6 cycles Administration: 6 mg (05/18/2019), 6 mg (06/08/2019) vinCRIStine (ONCOVIN) 1 mg in sodium chloride 0.9 % 50 mL chemo infusion, 1 mg (100 % of original dose 1 mg), Intravenous,  Once, 2 of 6 cycles Dose modification: 1 mg (original dose 1 mg, Cycle 1, Reason: Provider  Judgment) Administration: 1 mg (05/18/2019), 1 mg (06/08/2019) riTUXimab (RITUXAN) 800 mg in sodium chloride 0.9 % 250 mL (2.4242 mg/mL) infusion, 375 mg/m2 = 800 mg, Intravenous,  Once, 2 of 6 cycles Administration: 800 mg (05/18/2019), 800 mg (06/08/2019) cyclophosphamide (CYTOXAN) 1,240 mg in sodium chloride 0.9 % 250 mL chemo infusion, 600 mg/m2 = 1,540 mg, Intravenous,  Once, 2 of 6 cycles Dose modification: 600 mg/m2 (original dose 750 mg/m2, Cycle 2, Reason: Provider Judgment) Administration: 1,240 mg (05/18/2019), 1,240 mg (06/08/2019) fosaprepitant (EMEND) 150 mg, dexamethasone (DECADRON) 12 mg in sodium chloride 0.9 % 145 mL IVPB, , Intravenous,  Once, 2 of 6 cycles Administration:  (05/18/2019),  (06/08/2019)  for chemotherapy treatment.    07/19/2019 -  Chemotherapy   The patient had riTUXimab (RITUXAN) 700 mg in sodium chloride 0.9 % 250 mL (2.1875 mg/mL) infusion, 375 mg/m2 = 700 mg, Intravenous,  Once, 11 of 11 cycles Administration: 700 mg (07/19/2019), 700 mg (08/16/2019), 700 mg (09/13/2019), 700 mg (10/10/2019), 700 mg (11/07/2019), 700 mg (12/07/2019), 700 mg (01/04/2020), 700 mg (02/01/2020), 700 mg (03/05/2020), 700 mg (04/02/2020), 700 mg (04/30/2020) riTUXimab-pvvr (RUXIENCE) 700 mg in sodium chloride 0.9 % 250 mL (2.1875 mg/mL) infusion, 375 mg/m2 = 700 mg (original dose ), Intravenous,  Once, 0 of 1 cycle Dose modification: 375 mg/m2 (Cycle 12)  for chemotherapy treatment.     PHYSICAL EXAMINATION:  Vitals:   06/30/20 2123 07/01/20 0608  BP: (!) 142/62 134/71  Pulse: 87 93  Resp: 20 20  Temp: 98.2 F (36.8 C) 98 F (36.7 C)  SpO2: 93% 91%   Filed Weights  06/23/20 1206  Weight: 78.9 kg    Intake/Output from previous day: 07/12 0701 - 07/13 0700 In: 1150.3 [P.O.:720; I.V.:230.3; IV Piggyback:200] Out: 0   GENERAL:alert, confused this morning HEENT: No thrush LYMPH: Soft mobile 1 cm cervical and supraclavicular nodes on the left LUNGS: clear to auscultation and  percussion with normal breathing effort, decreased breath sounds at the left lower posterior chest HEART: regular rate & rhythm and no murmurs and no lower extremity edema ABDOMEN: mild tenderness and distention with palpation SKIN: Multiple ecchymoses noted on her bilateral arms NEURO: alert & oriented x 3 with fluent speech, no focal motor/sensory deficits  LABORATORY DATA:  I have reviewed the data as listed CMP Latest Ref Rng & Units 06/29/2020 06/28/2020 06/26/2020  Glucose 70 - 99 mg/dL 121(H) 106(H) 98  BUN 8 - 23 mg/dL 8 7(L) 11  Creatinine 0.44 - 1.00 mg/dL 0.67 0.70 0.84  Sodium 135 - 145 mmol/L 136 133(L) 136  Potassium 3.5 - 5.1 mmol/L 3.5 3.3(L) 3.8  Chloride 98 - 111 mmol/L 97(L) 96(L) 99  CO2 22 - 32 mmol/L _0 Calcium 8.9 - 10.3 mg/dL 7.9(L) 7.8(L) 8.1(L)  Total Protein 6.5 - 8.1 g/dL - 6.2(L) 5.8(L)  Total Bilirubin 0.3 - 1.2 mg/dL - 1.1 1.1  Alkaline Phos 38 - 126 U/L - 136(H) 133(H)  AST 15 - 41 U/L - 102(H) 49(H)  ALT 0 - 44 U/L - 33 20    Lab Results  Component Value Date   WBC 6.3 06/26/2020   HGB 12.0 06/26/2020   HCT 35.6 (L) 06/26/2020   MCV 103.2 (H) 06/26/2020   PLT 143 (L) 06/26/2020   NEUTROABS 2.8 06/23/2020    DG Chest 2 View  Result Date: 06/26/2020 CLINICAL DATA:  Dyspnea. EXAM: CHEST - 2 VIEW COMPARISON:  Chest CT 05/22/2020 FINDINGS: Right chest port remains in place. Hazy opacity at the left lung base consistent with pleural effusion and airspace disease/atelectasis. Right lung is clear. Upper normal heart size. Mild right hilar prominence likely secondary to combination of vascular structures and adenopathy is seen on recent CT. Fullness in the upper mediastinum likely secondary to adenopathy. No pneumothorax. No pulmonary edema. Surgical clips in the left axilla. IMPRESSION: 1. Hazy opacity at the left lung base consistent with pleural effusion and airspace disease/atelectasis. 2. Mild right hilar prominence likely secondary to combination of  vascular structures and adenopathy seen on recent CT. Electronically Signed   By: Keith Rake M.D.   On: 06/26/2020 15:54   CT Abdomen Pelvis W Contrast  Result Date: 06/23/2020 CLINICAL DATA:  86 year old female with abdominal pain, nausea and vomiting. History of lymphoma. EXAM: CT ABDOMEN AND PELVIS WITH CONTRAST TECHNIQUE: Multidetector CT imaging of the abdomen and pelvis was performed using the standard protocol following bolus administration of intravenous contrast. CONTRAST:  55m OMNIPAQUE IOHEXOL 300 MG/ML  SOLN COMPARISON:  CT of the chest abdomen pelvis dated 05/22/2020. FINDINGS: Lower chest: Partially visualized moderate left and trace right pleural effusions, new since the prior CT. There is associated partial compressive atelectasis of the left lower lobe. Pneumonia is not excluded. Clinical correlation is recommended. No intra-abdominal free air. Interval development of a small ascites, new or significantly increased since the prior CT. Hepatobiliary: A 1 cm hypodense focus along the posterior liver capsule is not characterized but may represent a cyst or focal area of scarring. This is similar to prior CT. Subcentimeter hypodense focus in the anterior liver is too small to characterize. There  is mild intrahepatic biliary ductal dilatation, likely post cholecystectomy. The common bile duct is dilated measuring 14 mm. No retained calcified stone noted in the central CBD. Pancreas: The pancreas is unremarkable as visualized. Spleen: Mildly enlarged spleen measuring 15 cm in craniocaudal length. Adrenals/Urinary Tract: The adrenal glands are poorly visualized but grossly unremarkable. There is mild fullness of the renal collecting systems bilaterally without frank hydronephrosis. Mild hazy appearance of the urothelium noted. Correlation with urinalysis recommended to exclude UTI. There is symmetric enhancement and excretion of contrast by both kidneys. Subcentimeter left renal hypodense focus  is not characterized. The urinary bladder is partially distended. Probable chronic bladder wall thickening and perivesical stranding. Correlation with urinalysis recommended to exclude UTI. Stomach/Bowel: There is sigmoid diverticulosis without definite active inflammatory changes. There is postsurgical changes of bowel with anastomotic suture in the right lower quadrant. No definite evidence of bowel obstruction. Evaluation however is limited in the absence of oral contrast. Vascular/Lymphatic: There is moderate aortoiliac atherosclerotic disease. The IVC is unremarkable as visualized. The SMV, splenic vein, and main portal vein are patent. No portal venous gas. Bulky retroperitoneal adenopathy encasing the abdominal aorta and IVC as well as enlarged bilateral iliac chain lymph nodes in keeping with history of lymphoma. Overall the degree of adenopathy is relatively similar to prior CT. Reproductive: The uterus is poorly visualized. Other: Diffuse mesenteric edema, new since the prior CT. There is scattered mesenteric adenopathy as seen previously. Musculoskeletal: Degenerative changes of the spine. Minimal compression fracture of the anterior superior and inferior endplates of the L3, new since the prior CT, likely acute or subacute. IMPRESSION: 1. Interval development of partially visualized moderate left and trace right pleural effusions, as well as development of diffuse mesenteric edema and ascites, new since the prior CT. 2. Bulky retroperitoneal and iliac chain adenopathy in keeping with history of lymphoma. Overall the degree of adenopathy is relatively similar to prior CT. 3. Mild splenomegaly. 4. Minimal compression fracture of the superior and inferior endplate of L3, likely acute or subacute. 5. Sigmoid diverticulosis. 6. Aortic Atherosclerosis (ICD10-I70.0). Electronically Signed   By: Anner Crete M.D.   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 Status post weekly Rituxan x4 03/01/2012 through 03/22/2012. She completed 4 "maintenance" doses of Rituxan, last on 12/19/2012. A restaging CT on 02/09/2013 showed no evidence of lymphoma.   Lymph node lateral to the thyroid bed on a neck ultrasound 02/21/2014, status post an FNA biopsy concerning for a lymphoproliferative disorder.  PET scan 09/28/2016 with active lymphoma within the neck, chest, abdomen, pelvis; splenic enlargement and hypermetabolism suspicious for splenic involvement.  Initiation of Rituxan weekly 4 09/29/2016  Initiation of maintenance Rituxan on a 3 month schedule 12/23/2016; final Rituxan 08/31/2018  Thyroid ultrasound 02/07/2019-left cervical lymphadenopathy  PET scan 03/08/2019-extensive recurrent hypermetabolic lymphoma involving the neck, chest, abdomen and pelvis.  03/16/2019 left cervical lymph node biopsy-features consistent with previously diagnosed non-Hodgkin's B-cell lymphoma, phenotypically consistent with marginal zone lymphoma. Flow cytometry with lambda restricted B-cell population without expression of CD5 or CD10 comprising 87% of all lymphocytes.  Cycle 1 bendamustine/Rituxan 03/22/2019  Excision deep left axillary lymph nodes 05/04/2019-non-Hodgkin's B-cell lymphoma with differential including a marginal zone lymphoma and atypical small lymphocytic lymphoma. Flow cytometry with monoclonal B-cell population without expression of CD5 or CD10, comprises 96% of all lymphocytes.  Cycle 1 [facility]/Rituxan 05/18/2019  Cycle 2 [facility]/Rituxan 06/08/2019  CTs 06/15/2019-partial improvement in diffuse adenopathy, stable mild splenomegaly  Cycle 1 Revlimid/rituximab 07/19/2019 (Revlimid start 07/20/2019)  Cycle 2 Revlimid/rituximab 08/16/2019 (Revlimid placed on hold 08/30/2019 due to neutropenia)  Cycle 3 of Revlimid/rituximab 09/13/2019 (Revlimid schedule  changed to 14 days on/14 days off)  Cycle 4 Revlimid/rituximab 10/10/2019  Cycle 5 Revlimid/rituximab 11/07/2019  Cycle 6 Revlimid/rituximab 12/07/2019  CTs 12/28/2019-diffuse lymphadenopathy-slightly increased compared to 06/15/2019  Cycle 7 Revlimid/rituximab 01/04/2020  Cycle 8 Revlimid/Rituxan 02/01/2020  Cycle 9 Revlimid/Rituxan 03/05/2020  Cycle 10 Revlimid/Rituxan 04/02/2020  Cycle 11 Revlimid/Rituxan 04/30/2020  CT 05/22/2020-mild increase in left supraclavicular adenopathy, mild increase in chest, retroperitoneal, and pelvic adenopathy. Stable mild splenomegaly.  Acalabrutinib started 06/19/2020  CT abdomen/pelvis 06/23/2020-moderate left and trace right pleural effusions, new diffuse mesenteric edema and ascites, stable retroperitoneal and iliac adenopathy, mild splenomegaly 2. Stage IV (T1bN1b M0) papillary thyroid cancer, status post a thyroidectomy with reimplantation of the left superior parathyroid gland on 05/23/2012, status post radioactive iodine therapy, followed by Dr. Buddy Duty.  3. Stage II (T3 N0) colon cancer, status post a right colectomy 10/19/2011, last colonoscopy April 2015-sigmoid adenoma removed.  4. History of a pulmonary embolism December 2012.  5. History of Atrial fibrillation 6. Iron deficiency anemia-new 03/18/2014. Hemoccult positive stool. The anemia corrected with iron. No longer taking iron.  Status post an upper endoscopy and colonoscopy by Dr. Carlean Purl April 2015 with no bleeding source identified, benign adenoma removed from the sigmoid colon. 7. Report of an upper gastric intestinal bleed fall 2016-managed in [place]. [name] 8. Left knee replacement May 2017, repeat left knee surgery May 2018 9. Pruritic rash 07/22/2016 10. Nausea and diarrhea 04/02/2019-stool negative for C. difficile toxin 11. 06/18/2019 [facility] admission for symptomatic anemia. 12.  Admission 06/23/2020 with nausea and diarrhea  Tracy Mclaughlin appears partially improved.  She has been  restarted on [place] with no recurrent diarrhea.  She has persistent nausea per nursing.  The etiology of her symptoms remains unclear.  I suspect her symptoms are related to lymphoma as opposed to toxicity from the acalabrutinib.  She was started on prednisone 10 mg daily last week for failure to thrive. Has developed confusion.   Recommendations: 1.  Continue acalabrutinib 2.  Continue as needed antiemetics 3.  Continue prednisone 10 mg daily for failure to thrive and anorexia 4.  Will ask IR to perform an ultrasound guided paracentesis to see if it improves her GI symptoms. There was a small amount of ascites noted on CT scan.     LOS: 7 days   Mikey Bussing 07/01/20 Ms. Matsuoka was interviewed and examined.  She appears slightly confused this morning, but better than when I saw her yesterday.  The confusion is most likely secondary to sundowning, though the prednisone could be contributing.  She continues to have nausea.  I doubt the nausea is related to acalabrutinib.  She will undergo a paracentesis today.  I recommend increasing ambulation as tolerated.  We need to assess whether she is able to go home versus a skilled nursing facility.   I discussed the case with her husband by telephone.  He indicates they have insurance coverage for home health care.

## 2020-07-01 NOTE — Progress Notes (Signed)
PROGRESS NOTE    Tracy Mclaughlin  OIB:[id number] DOB: 12/05/1934 DOA: 06/23/2020 PCP: [name], PA   Chef Complaints:  nausea  Brief Narrative: 86 y.o.femalewith medical history significant forlow-grade lymphoma without significant response to chemotherapy last year and was recently started onBTK INHIBITORby Dr. [name], hypothyroidism, history of A. fib, pancytopenia presents to the ED with3-4 day history of nausea vomiting and diarrhea and abdominal pain-and right upper quadrant.symptomts started after new chemo on Thursday.Patient denies any fever chest pain.vomitted x3 today and had non bloody diarrhea 4 times today.  ED Course:Hemodynamically stable, afebrile, UA positive for UTI,CT scan abdomen done that showed free fluid with ascites and mesenteric edema, case was discussed Dr. [name] from oncology advised UTI treatment with Zosyn and he will see the patient in the morning.  Patient  admitted, beingmanaged symptomatically chemo was held diarrhea improved subsequently oncology put the patient back on chemo overall tolerating well. Having nausea anorexia suspecting this is related to her lymphoma. Patient has been started on prednisone to help with appetite  Subjective:  Reports not feeling well today.  Was on the bed.  Complains of nausea low appetite but no vomiting or diarrhea.  And has periodic abdominal pain  Assessment & Plan:  Intractable nausea vomitingdiarrhea andabdominal pain: Unclear etiology suspecting due to lymphoma.  Chemo- Acalabrutinib was held on admission and resumed 7/8.  Telemetry improved, no recurrence of diarrhea after being back on Calwuence.  Also on prednisone 10 mg.  She has new onset ascites on the CT scan, discussed with oncology team for IR guided paracentesis to see if it improves her GI symptoms.  She is also restarted on Protonix.   Shortness of breath-no complaints.  Continue symptomatic management breathing treatments steroid.   Seen.    E coli UTI POA: Completed 7 days of antibiotic's.    Non Hodgkin's lymphoma-with progressive decline, patientwasstartedpast weekonAcalabrutinib she did not have significant response to chemotherapy last year.  Acalabrutinib was held on admission and resumed and has no recurrence of diarrhea. Marland KitchenLDH at 285.  Continue plan as per oncology.  Failure to thrive/deconditioning: Continue PT OT and rehabilitation.  Continue prednisone, Protonix.  Hypertension: BP is stable on home Cardizem.    Pancytopenia with anemia/mild thrombocytopenia in the setting of lymphoma:CBC is overall stable.  History of intermittent atrial fibrillation on Cardizem, not on anticoagulation at home.  Bruise on the skin heparin discontinued per oncology.  DVT prophylaxis: Place and maintain sequential compression device Start: 06/30/20 0901 Heparin stopped due to  extensive LE bruise 7/12 Code Status: DNR Family Communication: plan of care discussed with patient at bedside.  Husband was updated previously.   Status is: Inpatient  Remains inpatient appropriate because: Patient remains symptomatic, nauseous, with failure to thrive.  Dispo: The patient is from: Home              Anticipated d/c is to: TBD              Anticipated d/c date is: 1-2 days              Patient currently is not medically stable to d/c.  Remains hospitalized for ongoing management nausea and failure to thrive decreased oral intake.    Nutrition: Diet Order            Diet regular Room service appropriate? Yes; Fluid consistency: Thin  Diet effective now  Body mass index is 26.46 kg/m.  Consultants:see note  Procedures:see note Microbiology:see note  Medications: Scheduled Meds: . acalabrutinib  100 mg Oral Daily  . Chlorhexidine Gluconate Cloth  6 each Topical Daily  . citalopram  40 mg Oral Daily  . diltiazem  180 mg Oral QHS  . levothyroxine  224 mcg Oral Q0600  . melatonin  3 mg  Oral QHS  . pantoprazole  40 mg Intravenous Daily  . predniSONE  10 mg Oral Q breakfast   Continuous Infusions: . sodium chloride 10 mL/hr at 06/29/20 0600    Antimicrobials: Anti-infectives (From admission, onward)   Start     Dose/Rate Route Frequency Ordered Stop   06/27/20 0830  cefTRIAXone (ROCEPHIN) 1 g in sodium chloride 0.9 % 100 mL IVPB  Status:  Discontinued        1 g 200 mL/hr over 30 Minutes Intravenous Every 24 hours 06/27/20 0733 06/30/20 1340   06/24/20 0200  piperacillin-tazobactam (ZOSYN) IVPB 3.375 g  Status:  Discontinued        3.375 g 12.5 mL/hr over 240 Minutes Intravenous Every 8 hours 06/23/20 1703 06/27/20 0733   06/23/20 1645  piperacillin-tazobactam (ZOSYN) IVPB 3.375 g        3.375 g 100 mL/hr over 30 Minutes Intravenous  Once 06/23/20 1638 06/23/20 1834   06/23/20 1615  cefTRIAXone (ROCEPHIN) 1 g in sodium chloride 0.9 % 100 mL IVPB  Status:  Discontinued        1 g 200 mL/hr over 30 Minutes Intravenous  Once 06/23/20 1607 06/23/20 1608       Objective: Vitals: Today's Vitals   06/30/20 2123 06/30/20 2154 07/01/20 0608 07/01/20 1116  BP: (!) 142/62  134/71   Pulse: 87  93   Resp: 20  20   Temp: 98.2 F (36.8 C)  98 F (36.7 C)   TempSrc: Oral  Oral   SpO2: 93%  91%   Weight:      Height:      PainSc:  Asleep  0-No pain    Intake/Output Summary (Last 24 hours) at 07/01/2020 1310 Last data filed at 07/01/2020 0952 Gross per 24 hour  Intake 997.16 ml  Output 0 ml  Net 997.16 ml   Filed Weights   06/23/20 1206  Weight: 78.9 kg   Weight change:    Intake/Output from previous day: 07/12 0701 - 07/13 0700 In: 1150.3 [P.O.:720; I.V.:230.3; IV Piggyback:200] Out: 0  Intake/Output this shift: Total I/O In: 120 [P.O.:120] Out: -   Examination:  General exam: AAOx3,NAD,weak appearing.  Appears thin and frail. HEENT:Oral mucosa moist, Ear/Nose WNL grossly, dentition normal. Respiratory system: bilaterally clear,no wheezing or  crackles,no use of accessory muscle Cardiovascular system: S1 & S2 +, No JVD. Gastrointestinal system: Abdomen soft, mild epigastric tenderness,ND, BS+ Nervous System:Alert, awake, moving extremities and grossly nonfocal Extremities: No edema, distal peripheral pulses palpable.  Skin: No rashes,no icterus.  Bilateral lower extremity skin bruise present MSK: Normal muscle bulk,tone, power.  Data Reviewed: I have personally reviewed following labs and imaging studies CBC: Recent Labs  Lab 06/26/20 0346  WBC 6.3  HGB 12.0  HCT 35.6*  MCV 103.2*  PLT 676*   Basic Metabolic Panel: Recent Labs  Lab 06/26/20 0346 06/28/20 0818 06/29/20 0822  NA 136 133* 136  K 3.8 3.3* 3.5  CL 99 96* 97*  CO2 25 28 29   GLUCOSE 98 106* 121*  BUN 11 7* 8  CREATININE 0.84 0.70 0.67  CALCIUM 8.1* 7.8*  7.9*   GFR: Estimated Creatinine Clearance: 58.8 mL/min (by C-G formula based on SCr of 0.67 mg/dL). Liver Function Tests: Recent Labs  Lab 06/26/20 0346 06/28/20 0818  AST 49* 102*  ALT 20 33  ALKPHOS 133* 136*  BILITOT 1.1 1.1  PROT 5.8* 6.2*  ALBUMIN 3.2* 3.5   No results for input(s): LIPASE, AMYLASE in the last 168 hours. No results for input(s): AMMONIA in the last 168 hours. Coagulation Profile: No results for input(s): INR, PROTIME in the last 168 hours. Cardiac Enzymes: No results for input(s): CKTOTAL, CKMB, CKMBINDEX, TROPONINI in the last 168 hours. BNP (last 3 results) No results for input(s): PROBNP in the last 8760 hours. HbA1C: No results for input(s): HGBA1C in the last 72 hours. CBG: No results for input(s): GLUCAP in the last 168 hours. Lipid Profile: No results for input(s): CHOL, HDL, LDLCALC, TRIG, CHOLHDL, LDLDIRECT in the last 72 hours. Thyroid Function Tests: No results for input(s): TSH, T4TOTAL, FREET4, T3FREE, THYROIDAB in the last 72 hours. Anemia Panel: No results for input(s): VITAMINB12, FOLATE, FERRITIN, TIBC, IRON, RETICCTPCT in the last 72  hours. Sepsis Labs: No results for input(s): PROCALCITON, LATICACIDVEN in the last 168 hours.  Recent Results (from the past 240 hour(s))  Urine culture     Status: Abnormal   Collection Time: 06/23/20  2:11 PM   Specimen: Urine, Random  Result Value Ref Range Status   Specimen Description   Final    URINE, RANDOM Performed at [facility] [address], [place], [place] [address]    Special Requests   Final    NONE Performed at [facility], [place] [address], [place], [place] [address]    Culture >=100,000 COLONIES/mL ESCHERICHIA COLI (A)  Final   Report Status 06/26/2020 FINAL  Final   Organism ID, Bacteria ESCHERICHIA COLI (A)  Final      Susceptibility   Escherichia coli - MIC*    AMPICILLIN >=32 RESISTANT Resistant     CEFAZOLIN <=4 SENSITIVE Sensitive     CEFTRIAXONE <=0.25 SENSITIVE Sensitive     CIPROFLOXACIN <=0.25 SENSITIVE Sensitive     GENTAMICIN <=1 SENSITIVE Sensitive     IMIPENEM <=0.25 SENSITIVE Sensitive     NITROFURANTOIN <=16 SENSITIVE Sensitive     TRIMETH/SULFA <=20 SENSITIVE Sensitive     AMPICILLIN/SULBACTAM >=32 RESISTANT Resistant     PIP/TAZO <=4 SENSITIVE Sensitive     * >=100,000 COLONIES/mL ESCHERICHIA COLI  C Difficile Quick Screen w PCR reflex     Status: None   Collection Time: 06/23/20  4:26 PM   Specimen: Nasopharyngeal Swab; Stool  Result Value Ref Range Status   C Diff antigen NEGATIVE NEGATIVE Final   C Diff toxin NEGATIVE NEGATIVE Final   C Diff interpretation No C. difficile detected.  Final    Comment: Performed at [facility], [place] [address] [place], [place] [address]  Blood culture (routine x 2)     Status: None   Collection Time: 06/23/20  5:58 PM   Specimen: BLOOD  Result Value Ref Range Status   Specimen Description   Final    BLOOD SITE NOT SPECIFIED Performed at [facility], [address], [place], [place] [address]    Special Requests   Final    BOTTLES  DRAWN AEROBIC AND ANAEROBIC Blood Culture adequate volume Performed at [facility] [address] [place], [place] [address]    Culture   Final    NO  GROWTH 5 DAYS Performed at [facility], [place] [address] [place], [place] [address]    Report Status 06/28/2020 FINAL  Final  Blood culture (routine x 2)     Status: None   Collection Time: 06/23/20  5:58 PM   Specimen: BLOOD  Result Value Ref Range Status   Specimen Description   Final    BLOOD SITE NOT SPECIFIED Performed at [facility] [address], [place], [place] [address]    Special Requests   Final    BOTTLES DRAWN AEROBIC AND ANAEROBIC Blood Culture adequate volume Performed at [facility] [address], [place], [place] [address]    Culture   Final    NO GROWTH 5 DAYS Performed at [facility], [place] [address], [place], [place] [address]    Report Status 06/28/2020 FINAL  Final  SARS Coronavirus 2 by RT PCR (hospital order, performed in [facility] hospital lab) Nasopharyngeal Nasopharyngeal Swab     Status: None   Collection Time: 06/23/20  5:58 PM   Specimen: Nasopharyngeal Swab  Result Value Ref Range Status   SARS Coronavirus 2 NEGATIVE NEGATIVE Final    Comment: (NOTE) SARS-CoV-2 target nucleic acids are NOT DETECTED.  The SARS-CoV-2 RNA is generally detectable in upper and lower respiratory specimens during the acute phase of infection. The lowest concentration of SARS-CoV-2 viral copies this assay can detect is 250 copies / mL. A negative result does not preclude SARS-CoV-2 infection and should not be used as the sole basis for treatment or other patient management decisions.  A negative result may occur with improper specimen collection / handling, submission of specimen other than nasopharyngeal swab, presence of viral mutation(s) within the areas targeted by this assay, and inadequate number of viral copies (<250 copies / mL). A negative result  must be combined with clinical observations, patient history, and epidemiological information.  Fact Sheet for Patients:   StrictlyIdeas.no  Fact Sheet for Healthcare Providers: BankingDealers.co.za  This test is not yet approved or  cleared by the [place] FDA and has been authorized for detection and/or diagnosis of SARS-CoV-2 by FDA under an Emergency Use Authorization (EUA).  This EUA will remain in effect (meaning this test can be used) for the duration of the COVID-19 declaration under Section 564(b)(1) of the Act, 21 U.S.C. section 360bbb-3(b)(1), unless the authorization is terminated or revoked sooner.  Performed at [facility], [place] [address], [place], [place] [address]       Radiology Studies: No results found.   LOS: 7 days   Antonieta Pert, MD [facility]  07/01/2020, 1:10 PM

## 2020-07-02 ENCOUNTER — Inpatient Hospital Stay (HOSPITAL_COMMUNITY): Payer: Medicare PPO

## 2020-07-02 LAB — AMMONIA: Ammonia: 26 umol/L (ref 9–35)

## 2020-07-02 LAB — CBC
HCT: 34.1 % — ABNORMAL LOW (ref 36.0–46.0)
Hemoglobin: 11.6 g/dL — ABNORMAL LOW (ref 12.0–15.0)
MCH: 35.2 pg — ABNORMAL HIGH (ref 26.0–34.0)
MCHC: 34 g/dL (ref 30.0–36.0)
MCV: 103.3 fL — ABNORMAL HIGH (ref 80.0–100.0)
Platelets: 77 10*3/uL — ABNORMAL LOW (ref 150–400)
RBC: 3.3 MIL/uL — ABNORMAL LOW (ref 3.87–5.11)
RDW: 15.1 % (ref 11.5–15.5)
WBC: 4.5 10*3/uL (ref 4.0–10.5)
nRBC: 0 % (ref 0.0–0.2)

## 2020-07-02 LAB — COMPREHENSIVE METABOLIC PANEL
ALT: 115 U/L — ABNORMAL HIGH (ref 0–44)
AST: 331 U/L — ABNORMAL HIGH (ref 15–41)
Albumin: 3 g/dL — ABNORMAL LOW (ref 3.5–5.0)
Alkaline Phosphatase: 137 U/L — ABNORMAL HIGH (ref 38–126)
Anion gap: 13 (ref 5–15)
BUN: 22 mg/dL (ref 8–23)
CO2: 26 mmol/L (ref 22–32)
Calcium: 7.6 mg/dL — ABNORMAL LOW (ref 8.9–10.3)
Chloride: 97 mmol/L — ABNORMAL LOW (ref 98–111)
Creatinine, Ser: 1.03 mg/dL — ABNORMAL HIGH (ref 0.44–1.00)
GFR calc Af Amer: 58 mL/min — ABNORMAL LOW (ref >60)
GFR calc non Af Amer: 50 mL/min — ABNORMAL LOW (ref >60)
Glucose, Bld: 109 mg/dL — ABNORMAL HIGH (ref 70–99)
Potassium: 3.7 mmol/L (ref 3.5–5.1)
Sodium: 136 mmol/L (ref 135–145)
Total Bilirubin: 0.8 mg/dL (ref 0.3–1.2)
Total Protein: 5.4 g/dL — ABNORMAL LOW (ref 6.5–8.1)

## 2020-07-02 LAB — HEPATITIS PANEL, ACUTE
HCV Ab: NONREACTIVE
Hep A IgM: NONREACTIVE
Hep B C IgM: NONREACTIVE
Hepatitis B Surface Ag: NONREACTIVE

## 2020-07-02 LAB — VITAMIN B12: Vitamin B-12: 505 pg/mL (ref 180–914)

## 2020-07-02 MED ORDER — ALBUTEROL SULFATE (2.5 MG/3ML) 0.083% IN NEBU
2.5000 mg | INHALATION_SOLUTION | RESPIRATORY_TRACT | Status: DC | PRN
  Administered 2020-07-04 – 2020-07-09 (×3): 2.5 mg via RESPIRATORY_TRACT
  Filled 2020-07-02 (×4): qty 3

## 2020-07-02 MED ORDER — SODIUM CHLORIDE 0.9 % IV SOLN: INTRAVENOUS | Status: DC

## 2020-07-02 NOTE — Progress Notes (Signed)
Pt cancel PT Cancellation Note  Patient Details Name: Tracy Mclaughlin MRN: [id number] DOB: 13-Oct-1934   Cancelled Treatment:      RN reports pt is unable to participate today.  Will check back another.  Pt has been evaluated.  Pt needs increased care but declined SNF and HH rec.   Nathanial Rancher 07/02/2020, 3:07 PM

## 2020-07-02 NOTE — Progress Notes (Signed)
HEMATOLOGY-ONCOLOGY PROGRESS NOTE  SUBJECTIVE: Lethargic this morning.  Her husband was not present.  Oncology History  Non Hodgkin's lymphoma ([place])  09/29/2016 Initial Diagnosis   Non Hodgkin's lymphoma ([place])   03/22/2019 - 04/23/2019 Chemotherapy   The patient had palonosetron (ALOXI) injection 0.25 mg, 0.25 mg, Intravenous,  Once, 1 of 4 cycles Administration: 0.25 mg (03/22/2019) riTUXimab (RITUXAN) 800 mg in sodium chloride 0.9 % 250 mL (2.4242 mg/mL) infusion, 375 mg/m2 = 800 mg, Intravenous,  Once, 1 of 4 cycles Administration: 800 mg (03/22/2019) bendamustine (BENDEKA) 150 mg in sodium chloride 0.9 % 50 mL (2.6786 mg/mL) chemo infusion, 70 mg/m2 = 150 mg (100 % of original dose 70 mg/m2), Intravenous,  Once, 1 of 4 cycles Dose modification: 70 mg/m2 (original dose 70 mg/m2, Cycle 1, Reason: Provider Judgment) Administration: 150 mg (03/22/2019), 150 mg (03/23/2019)  for chemotherapy treatment.    05/18/2019 - 06/28/2019 Chemotherapy   The patient had DOXOrubicin (ADRIAMYCIN) chemo injection 82 mg, 40 mg/m2 = 82 mg (100 % of original dose 40 mg/m2), Intravenous,  Once, 2 of 6 cycles Dose modification: 40 mg/m2 (original dose 40 mg/m2, Cycle 1, Reason: Provider Judgment) Administration: 82 mg (05/18/2019), 82 mg (06/08/2019) palonosetron (ALOXI) injection 0.25 mg, 0.25 mg, Intravenous,  Once, 2 of 6 cycles Administration: 0.25 mg (05/18/2019), 0.25 mg (06/08/2019) pegfilgrastim (NEULASTA ONPRO KIT) injection 6 mg, 6 mg, Subcutaneous, Once, 2 of 6 cycles Administration: 6 mg (05/18/2019), 6 mg (06/08/2019) vinCRIStine (ONCOVIN) 1 mg in sodium chloride 0.9 % 50 mL chemo infusion, 1 mg (100 % of original dose 1 mg), Intravenous,  Once, 2 of 6 cycles Dose modification: 1 mg (original dose 1 mg, Cycle 1, Reason: Provider Judgment) Administration: 1 mg (05/18/2019), 1 mg (06/08/2019) riTUXimab (RITUXAN) 800 mg in sodium chloride 0.9 % 250 mL (2.4242 mg/mL) infusion, 375 mg/m2 = 800 mg, Intravenous,  Once, 2  of 6 cycles Administration: 800 mg (05/18/2019), 800 mg (06/08/2019) cyclophosphamide (CYTOXAN) 1,240 mg in sodium chloride 0.9 % 250 mL chemo infusion, 600 mg/m2 = 1,540 mg, Intravenous,  Once, 2 of 6 cycles Dose modification: 600 mg/m2 (original dose 750 mg/m2, Cycle 2, Reason: Provider Judgment) Administration: 1,240 mg (05/18/2019), 1,240 mg (06/08/2019) fosaprepitant (EMEND) 150 mg, dexamethasone (DECADRON) 12 mg in sodium chloride 0.9 % 145 mL IVPB, , Intravenous,  Once, 2 of 6 cycles Administration:  (05/18/2019),  (06/08/2019)  for chemotherapy treatment.    07/19/2019 -  Chemotherapy   The patient had riTUXimab (RITUXAN) 700 mg in sodium chloride 0.9 % 250 mL (2.1875 mg/mL) infusion, 375 mg/m2 = 700 mg, Intravenous,  Once, 11 of 11 cycles Administration: 700 mg (07/19/2019), 700 mg (08/16/2019), 700 mg (09/13/2019), 700 mg (10/10/2019), 700 mg (11/07/2019), 700 mg (12/07/2019), 700 mg (01/04/2020), 700 mg (02/01/2020), 700 mg (03/05/2020), 700 mg (04/02/2020), 700 mg (04/30/2020) riTUXimab-pvvr (RUXIENCE) 700 mg in sodium chloride 0.9 % 250 mL (2.1875 mg/mL) infusion, 375 mg/m2 = 700 mg (original dose ), Intravenous,  Once, 0 of 1 cycle Dose modification: 375 mg/m2 (Cycle 12)  for chemotherapy treatment.     PHYSICAL EXAMINATION:  Vitals:   07/02/20 0524 07/02/20 1325  BP: (!) 141/62 138/66  Pulse: 83 76  Resp: 18 16  Temp: 98.1 F (36.7 C) 98 F (36.7 C)  SpO2: 95% 97%   Filed Weights   06/23/20 1206  Weight: 174 lb (78.9 kg)    Intake/Output from previous day: 07/13 0701 - 07/14 0700 In: 1080 [P.O.:1080] Out: 300 [Urine:300]  GENERAL: Lethargic HEENT: No thrush,  the tongue is dry LYMPH: Soft mobile 1-2 cm left cervical and bilateral axillary nodes LUNGS: clear anteriorly, no respiratory distress HEART: regular rate & rhythm and no murmurs and no lower extremity edema ABDOMEN: Nontender, no mass, mildly distended SKIN: Multiple ecchymoses noted on her bilateral arms NEURO:  Lethargic, arousable, follows simple commands, moves all extremities, not speaking in sentences, not oriented  LABORATORY DATA:  I have reviewed the data as listed CMP Latest Ref Rng & Units 07/02/2020 06/29/2020 06/28/2020  Glucose 70 - 99 mg/dL 109(H) 121(H) 106(H)  BUN 8 - 23 mg/dL 22 8 7(L)  Creatinine 0.44 - 1.00 mg/dL 1.03(H) 0.67 0.70  Sodium 135 - 145 mmol/L 136 136 133(L)  Potassium 3.5 - 5.1 mmol/L 3.7 3.5 3.3(L)  Chloride 98 - 111 mmol/L 97(L) 97(L) 96(L)  CO2 22 - 32 mmol/L _0 Calcium 8.9 - 10.3 mg/dL 7.6(L) 7.9(L) 7.8(L)  Total Protein 6.5 - 8.1 g/dL 5.4(L) - 6.2(L)  Total Bilirubin 0.3 - 1.2 mg/dL 0.8 - 1.1  Alkaline Phos 38 - 126 U/L 137(H) - 136(H)  AST 15 - 41 U/L 331(H) - 102(H)  ALT 0 - 44 U/L 115(H) - 33    Lab Results  Component Value Date   WBC 4.5 07/02/2020   HGB 11.6 (L) 07/02/2020   HCT 34.1 (L) 07/02/2020   MCV 103.3 (H) 07/02/2020   PLT 77 (L) 07/02/2020   NEUTROABS 2.8 06/23/2020    DG Chest 2 View  Result Date: 06/26/2020 CLINICAL DATA:  Dyspnea. EXAM: CHEST - 2 VIEW COMPARISON:  Chest CT 05/22/2020 FINDINGS: Right chest port remains in place. Hazy opacity at the left lung base consistent with pleural effusion and airspace disease/atelectasis. Right lung is clear. Upper normal heart size. Mild right hilar prominence likely secondary to combination of vascular structures and adenopathy is seen on recent CT. Fullness in the upper mediastinum likely secondary to adenopathy. No pneumothorax. No pulmonary edema. Surgical clips in the left axilla. IMPRESSION: 1. Hazy opacity at the left lung base consistent with pleural effusion and airspace disease/atelectasis. 2. Mild right hilar prominence likely secondary to combination of vascular structures and adenopathy seen on recent CT. Electronically Signed   By: Keith Rake M.D.   On: 06/26/2020 15:54   CT Abdomen Pelvis W Contrast  Result Date: 06/23/2020 CLINICAL DATA:  86 year old female with abdominal  pain, nausea and vomiting. History of lymphoma. EXAM: CT ABDOMEN AND PELVIS WITH CONTRAST TECHNIQUE: Multidetector CT imaging of the abdomen and pelvis was performed using the standard protocol following bolus administration of intravenous contrast. CONTRAST:  39m OMNIPAQUE IOHEXOL 300 MG/ML  SOLN COMPARISON:  CT of the chest abdomen pelvis dated 05/22/2020. FINDINGS: Lower chest: Partially visualized moderate left and trace right pleural effusions, new since the prior CT. There is associated partial compressive atelectasis of the left lower lobe. Pneumonia is not excluded. Clinical correlation is recommended. No intra-abdominal free air. Interval development of a small ascites, new or significantly increased since the prior CT. Hepatobiliary: A 1 cm hypodense focus along the posterior liver capsule is not characterized but may represent a cyst or focal area of scarring. This is similar to prior CT. Subcentimeter hypodense focus in the anterior liver is too small to characterize. There is mild intrahepatic biliary ductal dilatation, likely post cholecystectomy. The common bile duct is dilated measuring 14 mm. No retained calcified stone noted in the central CBD. Pancreas: The pancreas is unremarkable as visualized. Spleen: Mildly enlarged spleen measuring 15 cm in craniocaudal  length. Adrenals/Urinary Tract: The adrenal glands are poorly visualized but grossly unremarkable. There is mild fullness of the renal collecting systems bilaterally without frank hydronephrosis. Mild hazy appearance of the urothelium noted. Correlation with urinalysis recommended to exclude UTI. There is symmetric enhancement and excretion of contrast by both kidneys. Subcentimeter left renal hypodense focus is not characterized. The urinary bladder is partially distended. Probable chronic bladder wall thickening and perivesical stranding. Correlation with urinalysis recommended to exclude UTI. Stomach/Bowel: There is sigmoid diverticulosis  without definite active inflammatory changes. There is postsurgical changes of bowel with anastomotic suture in the right lower quadrant. No definite evidence of bowel obstruction. Evaluation however is limited in the absence of oral contrast. Vascular/Lymphatic: There is moderate aortoiliac atherosclerotic disease. The IVC is unremarkable as visualized. The SMV, splenic vein, and main portal vein are patent. No portal venous gas. Bulky retroperitoneal adenopathy encasing the abdominal aorta and IVC as well as enlarged bilateral iliac chain lymph nodes in keeping with history of lymphoma. Overall the degree of adenopathy is relatively similar to prior CT. Reproductive: The uterus is poorly visualized. Other: Diffuse mesenteric edema, new since the prior CT. There is scattered mesenteric adenopathy as seen previously. Musculoskeletal: Degenerative changes of the spine. Minimal compression fracture of the anterior superior and inferior endplates of the L3, new since the prior CT, likely acute or subacute. IMPRESSION: 1. Interval development of partially visualized moderate left and trace right pleural effusions, as well as development of diffuse mesenteric edema and ascites, new since the prior CT. 2. Bulky retroperitoneal and iliac chain adenopathy in keeping with history of lymphoma. Overall the degree of adenopathy is relatively similar to prior CT. 3. Mild splenomegaly. 4. Minimal compression fracture of the superior and inferior endplate of L3, likely acute or subacute. 5. Sigmoid diverticulosis. 6. Aortic Atherosclerosis (ICD10-I70.0). Electronically Signed   By: Anner Crete M.D.   On: 06/23/2020 16:39   [place] ASCITES (ABDOMEN LIMITED)  Result Date: 07/01/2020 CLINICAL DATA:  History of lymphoma, now with concern for symptomatic intra-abdominal ascites. Please perform ascites search ultrasound and ultrasound-guided paracentesis as indicated. EXAM: LIMITED ABDOMEN ULTRASOUND FOR ASCITES TECHNIQUE: Limited  ultrasound survey for ascites was performed in all four abdominal quadrants. COMPARISON:  CT abdomen pelvis-06/23/2020 FINDINGS: Sonographic evaluation demonstrates a trace amount of intra-abdominal ascites, too small to allow for safe ultrasound-guided paracentesis. Note is made of small bilateral pleural effusions, the right side of which appears new compared to abdominal CT performed 06/23/2020. IMPRESSION: 1. Trace amount of intra-abdominal ascites, too small to allow for safe ultrasound-guided paracentesis. No paracentesis attempted. 2. Small bilateral pleural effusions - the left-sided pleural effusion appears similar to abdominal CT performed 06/23/2020 however the right-sided pleural effusion appears new of the CT. Electronically Signed   By: Sandi Mariscal M.D.   On: 07/01/2020 15:11   US Abdomen Limited RUQ  Result Date: 07/02/2020 CLINICAL DATA:  Abnormal LFTs EXAM: ULTRASOUND ABDOMEN LIMITED RIGHT UPPER QUADRANT COMPARISON:  CT 06/23/2020.  Ultrasound 07/01/2020 FINDINGS: Gallbladder: Prior cholecystectomy Common bile duct: Diameter: Prominent measuring up to 10 mm, likely related to post cholecystectomy state. Liver: Heterogeneous, slightly increased echotexture. Subtle nodular contours to the liver surface. Findings raise the possibility of cirrhosis. No suspicious focal hepatic abnormality. Portal vein is patent on color Doppler imaging with normal direction of blood flow towards the liver. Other: Ascites and right effusion noted. Prominent porta hepatis lymph nodes. IMPRESSION: Heterogeneous, increased echotexture throughout the liver. Subtle nodular contour seen in some areas of the liver. Findings  suggest the possibility of cirrhosis. Perihepatic ascites and right pleural effusion. Mild biliary ductal dilatation, likely related to post cholecystectomy state. Prominent porta hepatis lymph nodes, likely related to liver disease. Electronically Signed   By: Rolm Baptise M.D.   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 Status post weekly Rituxan x4 03/01/2012 through 03/22/2012. She completed 4 "maintenance" doses of Rituxan, last on 12/19/2012. A restaging CT on 02/09/2013 showed no evidence of lymphoma.   Lymph node lateral to the thyroid bed on a neck ultrasound 02/21/2014, status post an FNA biopsy concerning for a lymphoproliferative disorder.  PET scan 09/28/2016 with active lymphoma within the neck, chest, abdomen, pelvis; splenic enlargement and hypermetabolism suspicious for splenic involvement.  Initiation of Rituxan weekly 4 09/29/2016  Initiation of maintenance Rituxan on a 3 month schedule 12/23/2016; final Rituxan 08/31/2018  Thyroid ultrasound 02/07/2019-left cervical lymphadenopathy  PET scan 03/08/2019-extensive recurrent hypermetabolic lymphoma involving the neck, chest, abdomen and pelvis.  03/16/2019 left cervical lymph node biopsy-features consistent with previously diagnosed non-Hodgkin's B-cell lymphoma, phenotypically consistent with marginal zone lymphoma. Flow cytometry with lambda restricted B-cell population without expression of CD5 or CD10 comprising 87% of all lymphocytes.  Cycle 1 bendamustine/Rituxan 03/22/2019  Excision deep left axillary lymph nodes 05/04/2019-non-Hodgkin's B-cell lymphoma with differential including a marginal zone lymphoma and atypical small lymphocytic lymphoma. Flow cytometry with monoclonal B-cell population without expression of CD5 or CD10, comprises 96% of all lymphocytes.  Cycle 1 [facility]/Rituxan 05/18/2019  Cycle 2 [facility]/Rituxan 06/08/2019  CTs 06/15/2019-partial improvement in diffuse adenopathy, stable mild splenomegaly  Cycle 1 Revlimid/rituximab 07/19/2019 (Revlimid start 07/20/2019)  Cycle 2 Revlimid/rituximab 08/16/2019 (Revlimid placed on hold 08/30/2019 due to  neutropenia)  Cycle 3 of Revlimid/rituximab 09/13/2019 (Revlimid schedule changed to 14 days on/14 days off)  Cycle 4 Revlimid/rituximab 10/10/2019  Cycle 5 Revlimid/rituximab 11/07/2019  Cycle 6 Revlimid/rituximab 12/07/2019  CTs 12/28/2019-diffuse lymphadenopathy-slightly increased compared to 06/15/2019  Cycle 7 Revlimid/rituximab 01/04/2020  Cycle 8 Revlimid/Rituxan 02/01/2020  Cycle 9 Revlimid/Rituxan 03/05/2020  Cycle 10 Revlimid/Rituxan 04/02/2020  Cycle 11 Revlimid/Rituxan 04/30/2020  CT 05/22/2020-mild increase in left supraclavicular adenopathy, mild increase in chest, retroperitoneal, and pelvic adenopathy. Stable mild splenomegaly.  Acalabrutinib started 06/19/2020  CT abdomen/pelvis 06/23/2020-moderate left and trace right pleural effusions, new diffuse mesenteric edema and ascites, stable retroperitoneal and iliac adenopathy, mild splenomegaly 2. Stage IV (T1bN1b M0) papillary thyroid cancer, status post a thyroidectomy with reimplantation of the left superior parathyroid gland on 05/23/2012, status post radioactive iodine therapy, followed by Dr. Buddy Duty.  3. Stage II (T3 N0) colon cancer, status post a right colectomy 10/19/2011, last colonoscopy April 2015-sigmoid adenoma removed.  4. History of a pulmonary embolism December 2012.  5. History of Atrial fibrillation 6. Iron deficiency anemia-new 03/18/2014. Hemoccult positive stool. The anemia corrected with iron. No longer taking iron.  Status post an upper endoscopy and colonoscopy by Dr. Carlean Purl April 2015 with no bleeding source identified, benign adenoma removed from the sigmoid colon. 7. Report of an upper gastric intestinal bleed fall 2016-managed in [place]. [name] 8. Left knee replacement May 2017, repeat left knee surgery May 2018 9. Pruritic rash 07/22/2016 10. Nausea and diarrhea 04/02/2019-stool negative for C. difficile toxin 11. 06/18/2019 [facility] admission for symptomatic anemia. 12.  Admission 06/23/2020 with  nausea and diarrhea  Ms. Shupert is lethargic and confused today.  The etiology of the altered mental status is unclear.  I doubt this is directly related to the acalabrutinib.  It is possible the confusion is related to polypharmacy.  No apparent infection.  She has been treated for the UTI.  It would be unusual for the low-grade lymphoma to involve the CNS, but we need to consider this.  She has nausea that could be explained by a CNS process.  I discussed the case with Dr. Maren Beach  Recommendations: 1.  Discontinue acalabrutinib 2.  Stop prednisone and trazodone 3.  MRI brain to look for evidence of CNS involvement by lymphoma or a CVA 4.  Resume IV fluids    LOS: 8 days   Betsy Coder 07/02/20

## 2020-07-02 NOTE — Progress Notes (Addendum)
PROGRESS NOTE    Tracy Mclaughlin  LNL:[id number] DOB: 07-12-1934 DOA: 06/23/2020 PCP: [name], PA   Chef Complaints:  nausea  Brief Narrative: 86 y.o.femalewith medical history significant forlow-grade lymphoma without significant response to chemotherapy last year and was recently started onBTK INHIBITORby Dr. [name], hypothyroidism, history of A. fib, pancytopenia presents to the ED with3-4 day history of nausea vomiting and diarrhea and abdominal pain-and right upper quadrant.symptomts started after new chemo on Thursday.Patient denies any fever chest pain.vomitted x3 today and had non bloody diarrhea 4 times today.  ED Course:Hemodynamically stable, afebrile, UA positive for UTI,CT scan abdomen done that showed free fluid with ascites and mesenteric edema, case was discussed Dr. [name] from oncology advised UTI treatment with Zosyn and he will see the patient. Patient  admitted, being managed symptomatically chemo was held diarrhea improved subsequently oncology put the patient back on chemo overall tolerating well. Having nausea anorexia suspecting this is related to her lymphoma. Patient has been started on prednisone to help with appetite. Patient remains deconditioned with at times nauseous, poor appetite, intermittently confused  Subjective:  Resting on the bed today denies nausea and vomiting but feels weak frail. More sleepy/bit confused earlier. Complains of abdominal pain upon pushing the abdomen.  Assessment & Plan:  Intractable nausea vomitingdiarrhea andabdominal pain: Unclear etiology suspecting due to lymphoma.  Chemo- Acalabrutinib was held on admission and resumed 7/8.  Diarrhea improved and no recurrence.  Placed on prednisone to help with low appetite.She has new onset ascites on the CT scan-but too small to tap on the ultrasound unable to do paracentesis.  Has ongoing abdominal pain/mild tenderness with abnormal/worsening LFTs on the CMP.  Continue on supportive measures, ppi.  Abnormal LFTS:  RUQ ultrasound obtained, shows heterogenous increased echotexture throughout the liver subtle nodular contour, suggestive of possibility of cirrhosis perihepatic ascites, right pleural effusion, mild biliary dilatation postcholecystectomy state, prominent porta hepatis lymph nodes.  Ordered acute hepatitis panel. Discussed w Dr [name] could be from chemo so stopping it  Confusion/Lethargy: was bit confused to oncology this am. More alert,awake when I saw here. Trazodone, prednisone stopped. Add gentel ivf. Check ammonia, b12. If does not get better may need mri brain 2/2 lymphoma hx.Recheck UA 2/2 lethargy.   Shortness of breath-likely from her deconditioning/right pleural effusion.   E coli UTI POA: Completed 7 days of antibiotic's.  Recheck UA 2/2 lethargy.  Non Hodgkin's lymphoma-with progressive decline, patientwasstartedpast weekonAcalabrutinib she did not have significant response to chemotherapy last year.  Acalabrutinib was held on admission and resumed and has no recurrence of diarrhea. LDH at 285.  Oncology is following closely.   Failure to thrive/deconditioning: Continue PT OT and rehabilitation.  SNF versus home health, will obtain PT reeval.  Hypertension: Controlled on Cardizem.    Pancytopenia with anemia/Thrombocytopenia in the setting of lymphoma: Blood work shows thrombocytopenia platelet at 77. Monitor.  Also has lower leg bruise and heparin has been discontinued.  Monitor CBC. Recent Labs  Lab 06/26/20 0346 07/02/20 0409  HGB 12.0 11.6*  HCT 35.6* 34.1*  WBC 6.3 4.5  PLT 143* 77*   History of intermittent atrial fibrillation on Cardizem, not on anticoagulation at home.  Bruise on the skin heparin discontinued per oncology.  DVT prophylaxis: Place and maintain sequential compression device Start: 06/30/20 0901 Heparin stopped due to  extensive LE bruise 7/12 Code Status: DNR Family Communication: plan  of care discussed with patient at bedside.  Husband was updated previously.   Status is: Inpatient  Remains inpatient appropriate because: Patient remains symptomatic, nauseous, with failure to thrive.  Dispo: The patient is from: Home               Anticipated d/c is to: TBD: SNF vs HHC.  Will have PT work with her/reeval              Anticipated d/c date is: 1-2 days              Patient currently is not medically stable to d/c.  Remains hospitalized for ongoing management of electrolyte nausea, abdominal pain, now with worsening LFTs.  PT reevaluate ordered. Discharge once okay with oncology.  Nutrition: Diet Order            Diet regular Room service appropriate? Yes; Fluid consistency: Thin  Diet effective now                Body mass index is 26.46 kg/m.  Consultants:see note  Procedures:see note Microbiology:see note  Medications: Scheduled Meds: . acalabrutinib  100 mg Oral Daily  . Chlorhexidine Gluconate Cloth  6 each Topical Daily  . citalopram  40 mg Oral Daily  . diltiazem  180 mg Oral QHS  . levothyroxine  224 mcg Oral Q0600  . melatonin  3 mg Oral QHS  . pantoprazole  40 mg Intravenous Daily  . predniSONE  10 mg Oral Q breakfast   Continuous Infusions: . sodium chloride 10 mL/hr at 06/29/20 0600    Antimicrobials: Anti-infectives (From admission, onward)   Start     Dose/Rate Route Frequency Ordered Stop   06/27/20 0830  cefTRIAXone (ROCEPHIN) 1 g in sodium chloride 0.9 % 100 mL IVPB  Status:  Discontinued        1 g 200 mL/hr over 30 Minutes Intravenous Every 24 hours 06/27/20 0733 06/30/20 1340   06/24/20 0200  piperacillin-tazobactam (ZOSYN) IVPB 3.375 g  Status:  Discontinued        3.375 g 12.5 mL/hr over 240 Minutes Intravenous Every 8 hours 06/23/20 1703 06/27/20 0733   06/23/20 1645  piperacillin-tazobactam (ZOSYN) IVPB 3.375 g        3.375 g 100 mL/hr over 30 Minutes Intravenous  Once 06/23/20 1638 06/23/20 1834   06/23/20 1615  cefTRIAXone  (ROCEPHIN) 1 g in sodium chloride 0.9 % 100 mL IVPB  Status:  Discontinued        1 g 200 mL/hr over 30 Minutes Intravenous  Once 06/23/20 1607 06/23/20 1608       Objective: Vitals: Today's Vitals   07/01/20 1116 07/01/20 1318 07/01/20 2137 07/02/20 0524  BP:  (!) 141/68 115/88 (!) 141/62  Pulse:  96 96 83  Resp:  18 18 18   Temp:  98.3 F (36.8 C) 98.6 F (37 C) 98.1 F (36.7 C)  TempSrc:  Oral Oral Oral  SpO2:  94% 91% 95%  Weight:      Height:      PainSc: 0-No pain       Intake/Output Summary (Last 24 hours) at 07/02/2020 1220 Last data filed at 07/02/2020 0910 Gross per 24 hour  Intake 1080 ml  Output 500 ml  Net 580 ml   Filed Weights   06/23/20 1206  Weight: 78.9 kg   Weight change:    Intake/Output from previous day: 07/13 0701 - 07/14 0700 In: 1080 [P.O.:1080] Out: 300 [Urine:300] Intake/Output this shift: Total I/O In: 120 [P.O.:120] Out: 200 [Stool:200]  Examination:  General exam: AAOx3, frail, weak deconditioned. HEENT:Oral mucosa moist,  Ear/Nose WNL grossly, dentition normal. Respiratory system: bilaterally basal crackles,no wheezing no use of accessory muscle Cardiovascular system: S1 & S2 +, No JVD,. Gastrointestinal system: Abdomen soft, mildly tender in the right upper quadrant ,ND, BS+ Nervous System:Alert, awake, moving extremities and grossly nonfocal Extremities: No edema, distal peripheral pulses palpable.  Skin: No rashes,no icterus.  Bruising lower extremities. MSK: Normal muscle bulk,tone, power  Data Reviewed: I have personally reviewed following labs and imaging studies CBC: Recent Labs  Lab 06/26/20 0346 07/02/20 0409  WBC 6.3 4.5  HGB 12.0 11.6*  HCT 35.6* 34.1*  MCV 103.2* 103.3*  PLT 143* 77*   Basic Metabolic Panel: Recent Labs  Lab 06/26/20 0346 06/28/20 0818 06/29/20 0822 07/02/20 0409  NA 136 133* 136 136  K 3.8 3.3* 3.5 3.7  CL 99 96* 97* 97*  CO2 25 28 29 26   GLUCOSE 98 106* 121* 109*  BUN 11 7* 8  22  CREATININE 0.84 0.70 0.67 1.03*  CALCIUM 8.1* 7.8* 7.9* 7.6*   GFR: Estimated Creatinine Clearance: 45.7 mL/min (A) (by C-G formula based on SCr of 1.03 mg/dL (H)). Liver Function Tests: Recent Labs  Lab 06/26/20 0346 06/28/20 0818 07/02/20 0409  AST 49* 102* 331*  ALT 20 33 115*  ALKPHOS 133* 136* 137*  BILITOT 1.1 1.1 0.8  PROT 5.8* 6.2* 5.4*  ALBUMIN 3.2* 3.5 3.0*   No results for input(s): LIPASE, AMYLASE in the last 168 hours. No results for input(s): AMMONIA in the last 168 hours. Coagulation Profile: No results for input(s): INR, PROTIME in the last 168 hours. Cardiac Enzymes: No results for input(s): CKTOTAL, CKMB, CKMBINDEX, TROPONINI in the last 168 hours. BNP (last 3 results) No results for input(s): PROBNP in the last 8760 hours. HbA1C: No results for input(s): HGBA1C in the last 72 hours. CBG: No results for input(s): GLUCAP in the last 168 hours. Lipid Profile: No results for input(s): CHOL, HDL, LDLCALC, TRIG, CHOLHDL, LDLDIRECT in the last 72 hours. Thyroid Function Tests: No results for input(s): TSH, T4TOTAL, FREET4, T3FREE, THYROIDAB in the last 72 hours. Anemia Panel: No results for input(s): VITAMINB12, FOLATE, FERRITIN, TIBC, IRON, RETICCTPCT in the last 72 hours. Sepsis Labs: No results for input(s): PROCALCITON, LATICACIDVEN in the last 168 hours.  Recent Results (from the past 240 hour(s))  Urine culture     Status: Abnormal   Collection Time: 06/23/20  2:11 PM   Specimen: Urine, Random  Result Value Ref Range Status   Specimen Description   Final    URINE, RANDOM Performed at [facility] [address] [place], [place] [address]    Special Requests   Final    NONE Performed at [facility], [place] [address] [place],  [address]    Culture >=100,000 COLONIES/mL ESCHERICHIA COLI (A)  Final   Report Status 06/26/2020 FINAL  Final   Organism ID, Bacteria ESCHERICHIA COLI (A)  Final       Susceptibility   Escherichia coli - MIC*    AMPICILLIN >=32 RESISTANT Resistant     CEFAZOLIN <=4 SENSITIVE Sensitive     CEFTRIAXONE <=0.25 SENSITIVE Sensitive     CIPROFLOXACIN <=0.25 SENSITIVE Sensitive     GENTAMICIN <=1 SENSITIVE Sensitive     IMIPENEM <=0.25 SENSITIVE Sensitive     NITROFURANTOIN <=16 SENSITIVE Sensitive     TRIMETH/SULFA <=20 SENSITIVE Sensitive     AMPICILLIN/SULBACTAM >=32 RESISTANT Resistant     PIP/TAZO <=4 SENSITIVE Sensitive     * >=100,000 COLONIES/mL  ESCHERICHIA COLI  C Difficile Quick Screen w PCR reflex     Status: None   Collection Time: 06/23/20  4:26 PM   Specimen: Nasopharyngeal Swab; Stool  Result Value Ref Range Status   C Diff antigen NEGATIVE NEGATIVE Final   C Diff toxin NEGATIVE NEGATIVE Final   C Diff interpretation No C. difficile detected.  Final    Comment: Performed at [facility], [place] [address] [place], [place] [address]  Blood culture (routine x 2)     Status: None   Collection Time: 06/23/20  5:58 PM   Specimen: BLOOD  Result Value Ref Range Status   Specimen Description   Final    BLOOD SITE NOT SPECIFIED Performed at [facility], [address], [place], [place] [address]    Special Requests   Final    BOTTLES DRAWN AEROBIC AND ANAEROBIC Blood Culture adequate volume Performed at [facility] [address] [place], [place] [address]    Culture   Final    NO GROWTH 5 DAYS Performed at [facility], [place] [address] [place], [place] [address]    Report Status 06/28/2020 FINAL  Final  Blood culture (routine x 2)     Status: None   Collection Time: 06/23/20  5:58 PM   Specimen: BLOOD  Result Value Ref Range Status   Specimen Description   Final    BLOOD SITE NOT SPECIFIED Performed at [facility] [address], [place], [place] [address]    Special Requests   Final    BOTTLES DRAWN AEROBIC AND ANAEROBIC Blood Culture adequate volume Performed at [facility] [address], [place], [place] [address]    Culture   Final    NO GROWTH 5 DAYS Performed at [facility], [place] [address], [place], [place] [address]    Report Status 06/28/2020 FINAL  Final  SARS Coronavirus 2 by RT PCR (hospital order, performed in [facility] hospital lab) Nasopharyngeal Nasopharyngeal Swab     Status: None   Collection Time: 06/23/20  5:58 PM   Specimen: Nasopharyngeal Swab  Result Value Ref Range Status   SARS Coronavirus 2 NEGATIVE NEGATIVE Final    Comment: (NOTE) SARS-CoV-2 target nucleic acids are NOT DETECTED.  The SARS-CoV-2 RNA is generally detectable in upper and lower respiratory specimens during the acute phase of infection. The lowest concentration of SARS-CoV-2 viral copies this assay can detect is 250 copies / mL. A negative result does not preclude SARS-CoV-2 infection and should not be used as the sole basis for treatment or other patient management decisions.  A negative result may occur with improper specimen collection / handling, submission of specimen other than nasopharyngeal swab, presence of viral mutation(s) within the areas targeted by this assay, and inadequate number of viral copies (<250 copies / mL). A negative result must be combined with clinical observations, patient history, and epidemiological information.  Fact Sheet for Patients:   StrictlyIdeas.no  Fact Sheet for Healthcare Providers: BankingDealers.co.za  This test is not yet approved or  cleared by the [place] FDA and has been authorized for detection and/or diagnosis of SARS-CoV-2 by FDA under an Emergency Use Authorization (EUA).  This EUA will remain in effect (meaning this test can be used) for the duration of the COVID-19 declaration under Section 564(b)(1) of the Act, 21 U.S.C. section 360bbb-3(b)(1), unless the authorization is terminated or revoked sooner.  Performed at  [facility]  [place] [address], [place], [place] [address]       Radiology Studies: [place] ASCITES (ABDOMEN LIMITED)  Result Date: 07/01/2020 CLINICAL DATA:  History of lymphoma, now with concern for symptomatic intra-abdominal ascites. Please perform ascites search ultrasound and ultrasound-guided paracentesis as indicated. EXAM: LIMITED ABDOMEN ULTRASOUND FOR ASCITES TECHNIQUE: Limited ultrasound survey for ascites was performed in all four abdominal quadrants. COMPARISON:  CT abdomen pelvis-06/23/2020 FINDINGS: Sonographic evaluation demonstrates a trace amount of intra-abdominal ascites, too small to allow for safe ultrasound-guided paracentesis. Note is made of small bilateral pleural effusions, the right side of which appears new compared to abdominal CT performed 06/23/2020. IMPRESSION: 1. Trace amount of intra-abdominal ascites, too small to allow for safe ultrasound-guided paracentesis. No paracentesis attempted. 2. Small bilateral pleural effusions - the left-sided pleural effusion appears similar to abdominal CT performed 06/23/2020 however the right-sided pleural effusion appears new of the CT. Electronically Signed   By: Sandi Mariscal M.D.   On: 07/01/2020 15:11   US Abdomen Limited RUQ  Result Date: 07/02/2020 CLINICAL DATA:  Abnormal LFTs EXAM: ULTRASOUND ABDOMEN LIMITED RIGHT UPPER QUADRANT COMPARISON:  CT 06/23/2020.  Ultrasound 07/01/2020 FINDINGS: Gallbladder: Prior cholecystectomy Common bile duct: Diameter: Prominent measuring up to 10 mm, likely related to post cholecystectomy state. Liver: Heterogeneous, slightly increased echotexture. Subtle nodular contours to the liver surface. Findings raise the possibility of cirrhosis. No suspicious focal hepatic abnormality. Portal vein is patent on color Doppler imaging with normal direction of blood flow towards the liver. Other: Ascites and right effusion noted. Prominent porta hepatis lymph nodes. IMPRESSION: Heterogeneous,  increased echotexture throughout the liver. Subtle nodular contour seen in some areas of the liver. Findings suggest the possibility of cirrhosis. Perihepatic ascites and right pleural effusion. Mild biliary ductal dilatation, likely related to post cholecystectomy state. Prominent porta hepatis lymph nodes, likely related to liver disease. Electronically Signed   By: Rolm Baptise M.D.   On: 07/02/2020 08:52     LOS: 8 days   Antonieta Pert, MD [facility]  07/02/2020, 12:20 PM

## 2020-07-03 ENCOUNTER — Inpatient Hospital Stay (HOSPITAL_COMMUNITY): Payer: Medicare PPO

## 2020-07-03 LAB — COMPREHENSIVE METABOLIC PANEL
ALT: 77 U/L — ABNORMAL HIGH (ref 0–44)
AST: 192 U/L — ABNORMAL HIGH (ref 15–41)
Albumin: 2.6 g/dL — ABNORMAL LOW (ref 3.5–5.0)
Alkaline Phosphatase: 160 U/L — ABNORMAL HIGH (ref 38–126)
Anion gap: 9 (ref 5–15)
BUN: 25 mg/dL — ABNORMAL HIGH (ref 8–23)
CO2: 28 mmol/L (ref 22–32)
Calcium: 7 mg/dL — ABNORMAL LOW (ref 8.9–10.3)
Chloride: 99 mmol/L (ref 98–111)
Creatinine, Ser: 0.92 mg/dL (ref 0.44–1.00)
GFR calc Af Amer: >60 mL/min (ref >60)
GFR calc non Af Amer: 58 mL/min — ABNORMAL LOW (ref >60)
Glucose, Bld: 92 mg/dL (ref 70–99)
Potassium: 3.3 mmol/L — ABNORMAL LOW (ref 3.5–5.1)
Sodium: 136 mmol/L (ref 135–145)
Total Bilirubin: 0.8 mg/dL (ref 0.3–1.2)
Total Protein: 5 g/dL — ABNORMAL LOW (ref 6.5–8.1)

## 2020-07-03 LAB — CBC
HCT: 33.6 % — ABNORMAL LOW (ref 36.0–46.0)
Hemoglobin: 10.6 g/dL — ABNORMAL LOW (ref 12.0–15.0)
MCH: 33.9 pg (ref 26.0–34.0)
MCHC: 31.5 g/dL (ref 30.0–36.0)
MCV: 107.3 fL — ABNORMAL HIGH (ref 80.0–100.0)
Platelets: 67 10*3/uL — ABNORMAL LOW (ref 150–400)
RBC: 3.13 MIL/uL — ABNORMAL LOW (ref 3.87–5.11)
RDW: 15.5 % (ref 11.5–15.5)
WBC: 4.8 10*3/uL (ref 4.0–10.5)
nRBC: 0 % (ref 0.0–0.2)

## 2020-07-03 MED ORDER — PANTOPRAZOLE SODIUM 40 MG PO TBEC
40.0000 mg | DELAYED_RELEASE_TABLET | Freq: Every day | ORAL | Status: DC
  Administered 2020-07-03 – 2020-07-25 (×23): 40 mg via ORAL
  Filled 2020-07-03 (×22): qty 1

## 2020-07-03 MED ORDER — POTASSIUM CHLORIDE CRYS ER 20 MEQ PO TBCR
40.0000 meq | EXTENDED_RELEASE_TABLET | Freq: Once | ORAL | Status: AC
  Administered 2020-07-03: 40 meq via ORAL
  Filled 2020-07-03: qty 2

## 2020-07-03 NOTE — Progress Notes (Signed)
PROGRESS NOTE    Tracy Mclaughlin  QZR:[id number] DOB: 12/02/1934 DOA: 06/23/2020 PCP: [name], PA   Chef Complaints:  nausea  Brief Narrative: 86 y.o.femalewith medical history significant forlow-grade lymphoma without significant response to chemotherapy last year and was recently started onBTK INHIBITORby Dr. [name], hypothyroidism, history of A. fib, pancytopenia presents to the ED with3-4 day history of nausea vomiting and diarrhea and abdominal pain-and right upper quadrant.symptomts started after new chemo on Thursday.Patient denies any fever chest pain.vomitted x3 today and had non bloody diarrhea 4 times today.  ED Course:Hemodynamically stable, afebrile, UA positive for UTI,CT scan abdomen done that showed free fluid with ascites and mesenteric edema, case was discussed Dr. [name] from oncology advised UTI treatment with Zosyn and he will see the patient. Patient  admitted, being managed symptomatically chemo was held diarrhea improved subsequently oncology put the patient back on chemo overall tolerating well. Having nausea anorexia suspecting this is related to her lymphoma. Patient has been started on prednisone to help with appetite. Patient remains deconditioned with at times nauseous, poor appetite, intermittently confused  Subjective: this am feels okay not confused. Wheezing this am Having her meal. Mouth is dry  Assessment & Plan:  Intractable nausea vomitingdiarrhea andabdominal pain: Unclear etiology suspecting due to lymphoma.  Chemo- Acalabrutinib was held on admission and resumed 7/8.  Diarrhea improved. Placed on prednisone to help with low appetite.She has new onset ascites on the CT scan-but too small to tap on the ultrasound unable to do paracentesis.  No nausea and vomiting today.  Having some diarrhea continue symptomatic management.  Prednisone discontinued due to confusion.  LFTs downtrending  Abnormal LFTS: Likely due to chemo and  chemo has been held, LFTs slightly better today.  Right upper quadrant ultrasound possible cirrhotic changes, but elaborates panel negative.   Confusion/Lethargy: 7/14 a.m. but none today, improved after stopping her prednisone and trazodone likely polypharmacy medication induced acute metabolic encephalopathy.  Hold Off on MRI, ammonia B12 level normal.  Continue hydration symptomatic management.     Shortness of breath-likely from her deconditioning/right pleural effusion.  Checking chest x-ray.  Hypokalemia potassium 3.3.  Will replete orally.  E coli UTI POA: Completed 7 days of antibiotic's.  Recheck UA 2/2 lethargy.  Non Hodgkin's lymphoma-with progressive decline, patientwasstartedpast weekonAcalabrutinib she did not have significant response to chemotherapy last year.  Acalabrutinib was held on admission and resumed and has no recurrence of diarrhea. LDH at 285.  Oncology is following closely.   Failure to thrive/deconditioning: Continue PT OT and rehabilitation.  SNF versus home health continue to work with PT OT.  Hypertension: BP stable on Cardizem.    Pancytopenia with anemia/Thrombocytopenia in the setting of lymphoma: Blood work shows thrombocytopenia and platelet further downtrending likely from chemo and chemo has been stopped.  Off heparin.  Monitor. . Recent Labs  Lab 07/02/20 0409 07/03/20 0325  HGB 11.6* 10.6*  HCT 34.1* 33.6*  WBC 4.5 4.8  PLT 77* 67*   History of intermittent atrial fibrillation on Cardizem, not on anticoagulation at home.  Bruise on the skin heparin discontinued per oncology.  Platelets are low , will monitor need to monitor closely  DVT prophylaxis: Place and maintain sequential compression device Start: 06/30/20 0901 Heparin stopped due to  extensive LE bruise 7/12 Code Status: DNR Family Communication: plan of care discussed with patient at bedside.  Husband was updated previously.   Status is: Inpatient  Remains inpatient  appropriate because: Patient remains symptomatic, nauseous, with failure  to thrive.  Dispo: The patient is from: Home               Anticipated d/c is to: TBD: SNF vs HHC.  Work with PT.              Anticipated d/c date is: 1-2 days              Patient currently is not medically stable to d/c.  Remains hospitalized due to ongoing multiple issues with failure to thrive, intermittent nausea, confusion.  We will plan discharge once okay with oncology, follow-up chest x-ray and further recommendation  Nutrition: Diet Order            Diet regular Room service appropriate? Yes; Fluid consistency: Thin  Diet effective now                Body mass index is 26.46 kg/m.  Consultants:see note  Procedures:see note Microbiology:see note  Medications: Scheduled Meds: . Chlorhexidine Gluconate Cloth  6 each Topical Daily  . citalopram  40 mg Oral Daily  . diltiazem  180 mg Oral QHS  . levothyroxine  224 mcg Oral Q0600  . melatonin  3 mg Oral QHS  . pantoprazole  40 mg Intravenous Daily   Continuous Infusions: . sodium chloride 10 mL/hr at 06/29/20 0600  . sodium chloride      Antimicrobials: Anti-infectives (From admission, onward)   Start     Dose/Rate Route Frequency Ordered Stop   06/27/20 0830  cefTRIAXone (ROCEPHIN) 1 g in sodium chloride 0.9 % 100 mL IVPB  Status:  Discontinued        1 g 200 mL/hr over 30 Minutes Intravenous Every 24 hours 06/27/20 0733 06/30/20 1340   06/24/20 0200  piperacillin-tazobactam (ZOSYN) IVPB 3.375 g  Status:  Discontinued        3.375 g 12.5 mL/hr over 240 Minutes Intravenous Every 8 hours 06/23/20 1703 06/27/20 0733   06/23/20 1645  piperacillin-tazobactam (ZOSYN) IVPB 3.375 g        3.375 g 100 mL/hr over 30 Minutes Intravenous  Once 06/23/20 1638 06/23/20 1834   06/23/20 1615  cefTRIAXone (ROCEPHIN) 1 g in sodium chloride 0.9 % 100 mL IVPB  Status:  Discontinued        1 g 200 mL/hr over 30 Minutes Intravenous  Once 06/23/20 1607 06/23/20  1608       Objective: Vitals: Today's Vitals   07/02/20 2324 07/03/20 0015 07/03/20 0137 07/03/20 0455  BP:    (!) 123/53  Pulse: 79   76  Resp: 18   18  Temp:    97.6 F (36.4 C)  TempSrc:    Oral  SpO2: 97%   100%  Weight:      Height:      PainSc: Asleep Asleep Asleep     Intake/Output Summary (Last 24 hours) at 07/03/2020 0813 Last data filed at 07/03/2020 0400 Gross per 24 hour  Intake 480 ml  Output 200 ml  Net 280 ml   Filed Weights   06/23/20 1206  Weight: 78.9 kg   Weight change:    Intake/Output from previous day: 07/14 0701 - 07/15 0700 In: 480 [P.O.:480] Out: 200 [Stool:200] Intake/Output this shift: No intake/output data recorded.  Examination: General exam: AAO x3, appears ill frail, HEENT:Oral mucosa moist, Ear/Nose WNL grossly, dentition normal. Respiratory system: bilaterally diminished breath sounds at the base,no wheezing or crackles,no use of accessory muscle Cardiovascular system: S1 & S2 +, No JVD,. Gastrointestinal  system: Abdomen soft, NT,ND, BS+ Nervous System:Alert, awake, moving extremities and grossly nonfocal Extremities: No edema, distal peripheral pulses palpable.  Skin: No rashes,no icterus.  Bruise on lower extremities MSK: Normal muscle bulk,tone, power   Data Reviewed: I have personally reviewed following labs and imaging studies CBC: Recent Labs  Lab 07/02/20 0409 07/03/20 0325  WBC 4.5 4.8  HGB 11.6* 10.6*  HCT 34.1* 33.6*  MCV 103.3* 107.3*  PLT 77* 67*   Basic Metabolic Panel: Recent Labs  Lab 06/28/20 0818 06/29/20 0822 07/02/20 0409 07/03/20 0325  NA 133* 136 136 136  K 3.3* 3.5 3.7 3.3*  CL 96* 97* 97* 99  CO2 28 29 26 28   GLUCOSE 106* 121* 109* 92  BUN 7* 8 22 25*  CREATININE 0.70 0.67 1.03* 0.92  CALCIUM 7.8* 7.9* 7.6* 7.0*   GFR: Estimated Creatinine Clearance: 51.1 mL/min (by C-G formula based on SCr of 0.92 mg/dL). Liver Function Tests: Recent Labs  Lab 06/28/20 0818 07/02/20 0409  07/03/20 0325  AST 102* 331* 192*  ALT 33 115* 77*  ALKPHOS 136* 137* 160*  BILITOT 1.1 0.8 0.8  PROT 6.2* 5.4* 5.0*  ALBUMIN 3.5 3.0* 2.6*   No results for input(s): LIPASE, AMYLASE in the last 168 hours. Recent Labs  Lab 07/02/20 1623  AMMONIA 26   Coagulation Profile: No results for input(s): INR, PROTIME in the last 168 hours. Cardiac Enzymes: No results for input(s): CKTOTAL, CKMB, CKMBINDEX, TROPONINI in the last 168 hours. BNP (last 3 results) No results for input(s): PROBNP in the last 8760 hours. HbA1C: No results for input(s): HGBA1C in the last 72 hours. CBG: No results for input(s): GLUCAP in the last 168 hours. Lipid Profile: No results for input(s): CHOL, HDL, LDLCALC, TRIG, CHOLHDL, LDLDIRECT in the last 72 hours. Thyroid Function Tests: No results for input(s): TSH, T4TOTAL, FREET4, T3FREE, THYROIDAB in the last 72 hours. Anemia Panel: Recent Labs    07/02/20 1623  VITAMINB12 505   Sepsis Labs: No results for input(s): PROCALCITON, LATICACIDVEN in the last 168 hours.  Recent Results (from the past 240 hour(s))  Urine culture     Status: Abnormal   Collection Time: 06/23/20  2:11 PM   Specimen: Urine, Random  Result Value Ref Range Status   Specimen Description   Final    URINE, RANDOM Performed at [facility] [address], [place], [place] [address]    Special Requests   Final    NONE Performed at [facility], [place] [address] [place], [place] [address]    Culture >=100,000 COLONIES/mL ESCHERICHIA COLI (A)  Final   Report Status 06/26/2020 FINAL  Final   Organism ID, Bacteria ESCHERICHIA COLI (A)  Final      Susceptibility   Escherichia coli - MIC*    AMPICILLIN >=32 RESISTANT Resistant     CEFAZOLIN <=4 SENSITIVE Sensitive     CEFTRIAXONE <=0.25 SENSITIVE Sensitive     CIPROFLOXACIN <=0.25 SENSITIVE Sensitive     GENTAMICIN <=1 SENSITIVE Sensitive     IMIPENEM <=0.25 SENSITIVE Sensitive      NITROFURANTOIN <=16 SENSITIVE Sensitive     TRIMETH/SULFA <=20 SENSITIVE Sensitive     AMPICILLIN/SULBACTAM >=32 RESISTANT Resistant     PIP/TAZO <=4 SENSITIVE Sensitive     * >=100,000 COLONIES/mL ESCHERICHIA COLI  C Difficile Quick Screen w PCR reflex     Status: None   Collection Time: 06/23/20  4:26 PM   Specimen: Nasopharyngeal Swab; Stool  Result Value Ref  Range Status   C Diff antigen NEGATIVE NEGATIVE Final   C Diff toxin NEGATIVE NEGATIVE Final   C Diff interpretation No C. difficile detected.  Final    Comment: Performed at [facility], [place] [address], [place], [place] [address]  Blood culture (routine x 2)     Status: None   Collection Time: 06/23/20  5:58 PM   Specimen: BLOOD  Result Value Ref Range Status   Specimen Description   Final    BLOOD SITE NOT SPECIFIED Performed at [facility], [address] [place], [place] [address]    Special Requests   Final    BOTTLES DRAWN AEROBIC AND ANAEROBIC Blood Culture adequate volume Performed at [place] [address] [place], [place] [address]    Culture   Final    NO GROWTH 5 DAYS Performed at [facility], [place] [address] [place], [place] [address]    Report Status 06/28/2020 FINAL  Final  Blood culture (routine x 2)     Status: None   Collection Time: 06/23/20  5:58 PM   Specimen: BLOOD  Result Value Ref Range Status   Specimen Description   Final    BLOOD SITE NOT SPECIFIED Performed at [facility] [address] [place], [place] [address]    Special Requests   Final    BOTTLES DRAWN AEROBIC AND ANAEROBIC Blood Culture adequate volume Performed at [facility] [address], [place], [place] [address]    Culture   Final    NO GROWTH 5 DAYS Performed at [facility], [place] [address], [place], [place] [address]    Report Status 06/28/2020 FINAL  Final  SARS Coronavirus 2 by RT PCR (hospital order, performed in [facility] hospital lab) Nasopharyngeal Nasopharyngeal Swab     Status: None   Collection Time: 06/23/20  5:58 PM   Specimen: Nasopharyngeal Swab  Result Value Ref Range Status   SARS Coronavirus 2 NEGATIVE NEGATIVE Final    Comment: (NOTE) SARS-CoV-2 target nucleic acids are NOT DETECTED.  The SARS-CoV-2 RNA is generally detectable in upper and lower respiratory specimens during the acute phase of infection. The lowest concentration of SARS-CoV-2 viral copies this assay can detect is 250 copies / mL. A negative result does not preclude SARS-CoV-2 infection and should not be used as the sole basis for treatment or other patient management decisions.  A negative result may occur with improper specimen collection / handling, submission of specimen other than nasopharyngeal swab, presence of viral mutation(s) within the areas targeted by this assay, and inadequate number of viral copies (<250 copies / mL). A negative result must be combined with clinical observations, patient history, and epidemiological information.  Fact Sheet for Patients:   StrictlyIdeas.no  Fact Sheet for Healthcare Providers: BankingDealers.co.za  This test is not yet approved or  cleared by the [place] FDA and has been authorized for detection and/or diagnosis of SARS-CoV-2 by FDA under an Emergency Use Authorization (EUA).  This EUA will remain in effect (meaning this test can be used) for the duration of the COVID-19 declaration under Section 564(b)(1) of the Act, 21 U.S.C. section 360bbb-3(b)(1), unless the authorization is terminated or revoked sooner.  Performed at [facility], [place] [address], [place], [place] [address]       Radiology Studies: [place] ASCITES (ABDOMEN LIMITED)  Result Date: 07/01/2020 CLINICAL DATA:  History of lymphoma, now with concern  for symptomatic intra-abdominal ascites. Please perform ascites search ultrasound  and ultrasound-guided paracentesis as indicated. EXAM: LIMITED ABDOMEN ULTRASOUND FOR ASCITES TECHNIQUE: Limited ultrasound survey for ascites was performed in all four abdominal quadrants. COMPARISON:  CT abdomen pelvis-06/23/2020 FINDINGS: Sonographic evaluation demonstrates a trace amount of intra-abdominal ascites, too small to allow for safe ultrasound-guided paracentesis. Note is made of small bilateral pleural effusions, the right side of which appears new compared to abdominal CT performed 06/23/2020. IMPRESSION: 1. Trace amount of intra-abdominal ascites, too small to allow for safe ultrasound-guided paracentesis. No paracentesis attempted. 2. Small bilateral pleural effusions - the left-sided pleural effusion appears similar to abdominal CT performed 06/23/2020 however the right-sided pleural effusion appears new of the CT. Electronically Signed   By: Sandi Mariscal M.D.   On: 07/01/2020 15:11   US Abdomen Limited RUQ  Result Date: 07/02/2020 CLINICAL DATA:  Abnormal LFTs EXAM: ULTRASOUND ABDOMEN LIMITED RIGHT UPPER QUADRANT COMPARISON:  CT 06/23/2020.  Ultrasound 07/01/2020 FINDINGS: Gallbladder: Prior cholecystectomy Common bile duct: Diameter: Prominent measuring up to 10 mm, likely related to post cholecystectomy state. Liver: Heterogeneous, slightly increased echotexture. Subtle nodular contours to the liver surface. Findings raise the possibility of cirrhosis. No suspicious focal hepatic abnormality. Portal vein is patent on color Doppler imaging with normal direction of blood flow towards the liver. Other: Ascites and right effusion noted. Prominent porta hepatis lymph nodes. IMPRESSION: Heterogeneous, increased echotexture throughout the liver. Subtle nodular contour seen in some areas of the liver. Findings suggest the possibility of cirrhosis. Perihepatic ascites and right pleural effusion. Mild biliary ductal dilatation, likely related to post cholecystectomy state. Prominent porta hepatis lymph  nodes, likely related to liver disease. Electronically Signed   By: Rolm Baptise M.D.   On: 07/02/2020 08:52     LOS: 9 days   Antonieta Pert, MD [facility]  07/03/2020, 8:13 AM

## 2020-07-03 NOTE — Progress Notes (Addendum)
HEMATOLOGY-ONCOLOGY PROGRESS NOTE  SUBJECTIVE: She is alert.  Complains of a dry mouth.  No pain or nausea.  Oncology History  Non Hodgkin's lymphoma ([place])  09/29/2016 Initial Diagnosis   Non Hodgkin's lymphoma ([place])   03/22/2019 - 04/23/2019 Chemotherapy   The patient had palonosetron (ALOXI) injection 0.25 mg, 0.25 mg, Intravenous,  Once, 1 of 4 cycles Administration: 0.25 mg (03/22/2019) riTUXimab (RITUXAN) 800 mg in sodium chloride 0.9 % 250 mL (2.4242 mg/mL) infusion, 375 mg/m2 = 800 mg, Intravenous,  Once, 1 of 4 cycles Administration: 800 mg (03/22/2019) bendamustine (BENDEKA) 150 mg in sodium chloride 0.9 % 50 mL (2.6786 mg/mL) chemo infusion, 70 mg/m2 = 150 mg (100 % of original dose 70 mg/m2), Intravenous,  Once, 1 of 4 cycles Dose modification: 70 mg/m2 (original dose 70 mg/m2, Cycle 1, Reason: Provider Judgment) Administration: 150 mg (03/22/2019), 150 mg (03/23/2019)  for chemotherapy treatment.    05/18/2019 - 06/28/2019 Chemotherapy   The patient had DOXOrubicin (ADRIAMYCIN) chemo injection 82 mg, 40 mg/m2 = 82 mg (100 % of original dose 40 mg/m2), Intravenous,  Once, 2 of 6 cycles Dose modification: 40 mg/m2 (original dose 40 mg/m2, Cycle 1, Reason: Provider Judgment) Administration: 82 mg (05/18/2019), 82 mg (06/08/2019) palonosetron (ALOXI) injection 0.25 mg, 0.25 mg, Intravenous,  Once, 2 of 6 cycles Administration: 0.25 mg (05/18/2019), 0.25 mg (06/08/2019) pegfilgrastim (NEULASTA ONPRO KIT) injection 6 mg, 6 mg, Subcutaneous, Once, 2 of 6 cycles Administration: 6 mg (05/18/2019), 6 mg (06/08/2019) vinCRIStine (ONCOVIN) 1 mg in sodium chloride 0.9 % 50 mL chemo infusion, 1 mg (100 % of original dose 1 mg), Intravenous,  Once, 2 of 6 cycles Dose modification: 1 mg (original dose 1 mg, Cycle 1, Reason: Provider Judgment) Administration: 1 mg (05/18/2019), 1 mg (06/08/2019) riTUXimab (RITUXAN) 800 mg in sodium chloride 0.9 % 250 mL (2.4242 mg/mL) infusion, 375 mg/m2 = 800 mg, Intravenous,   Once, 2 of 6 cycles Administration: 800 mg (05/18/2019), 800 mg (06/08/2019) cyclophosphamide (CYTOXAN) 1,240 mg in sodium chloride 0.9 % 250 mL chemo infusion, 600 mg/m2 = 1,540 mg, Intravenous,  Once, 2 of 6 cycles Dose modification: 600 mg/m2 (original dose 750 mg/m2, Cycle 2, Reason: Provider Judgment) Administration: 1,240 mg (05/18/2019), 1,240 mg (06/08/2019) fosaprepitant (EMEND) 150 mg, dexamethasone (DECADRON) 12 mg in sodium chloride 0.9 % 145 mL IVPB, , Intravenous,  Once, 2 of 6 cycles Administration:  (05/18/2019),  (06/08/2019)  for chemotherapy treatment.    07/19/2019 -  Chemotherapy   The patient had riTUXimab (RITUXAN) 700 mg in sodium chloride 0.9 % 250 mL (2.1875 mg/mL) infusion, 375 mg/m2 = 700 mg, Intravenous,  Once, 11 of 11 cycles Administration: 700 mg (07/19/2019), 700 mg (08/16/2019), 700 mg (09/13/2019), 700 mg (10/10/2019), 700 mg (11/07/2019), 700 mg (12/07/2019), 700 mg (01/04/2020), 700 mg (02/01/2020), 700 mg (03/05/2020), 700 mg (04/02/2020), 700 mg (04/30/2020) riTUXimab-pvvr (RUXIENCE) 700 mg in sodium chloride 0.9 % 250 mL (2.1875 mg/mL) infusion, 375 mg/m2 = 700 mg (original dose ), Intravenous,  Once, 0 of 1 cycle Dose modification: 375 mg/m2 (Cycle 12)  for chemotherapy treatment.     PHYSICAL EXAMINATION:  Vitals:   07/02/20 2324 07/03/20 0455  BP:  (!) 123/53  Pulse: 79 76  Resp: 18 18  Temp:  97.6 F (36.4 C)  SpO2: 97% 100%   Filed Weights   06/23/20 1206  Weight: 174 lb (78.9 kg)    Intake/Output from previous day: 07/14 0701 - 07/15 0700 In: 480 [P.O.:480] Out: 200 [Stool:200]  GENERAL: Alert HEENT:  No thrush LYMPH: Soft mobile 1-2 cm left cervical and bilateral axillary nodes LUNGS: clear anteriorly, increased respiratory rate HEART: regular rate & rhythm and no murmurs and no lower extremity edema ABDOMEN: Nontender, no mass, mildly distended SKIN: Multiple ecchymoses noted on her bilateral arms, legs, and trunk NEURO: Alert, follows  commands, moves all extremities, speaking appropriately LABORATORY DATA:  I have reviewed the data as listed CMP Latest Ref Rng & Units 07/03/2020 07/02/2020 06/29/2020  Glucose 70 - 99 mg/dL 92 109(H) 121(H)  BUN 8 - 23 mg/dL 25(H) 22 8  Creatinine 0.44 - 1.00 mg/dL 0.92 1.03(H) 0.67  Sodium 135 - 145 mmol/L 136 136 136  Potassium 3.5 - 5.1 mmol/L 3.3(L) 3.7 3.5  Chloride 98 - 111 mmol/L 99 97(L) 97(L)  CO2 22 - 32 mmol/L _0 Calcium 8.9 - 10.3 mg/dL 7.0(L) 7.6(L) 7.9(L)  Total Protein 6.5 - 8.1 g/dL 5.0(L) 5.4(L) -  Total Bilirubin 0.3 - 1.2 mg/dL 0.8 0.8 -  Alkaline Phos 38 - 126 U/L 160(H) 137(H) -  AST 15 - 41 U/L 192(H) 331(H) -  ALT 0 - 44 U/L 77(H) 115(H) -    Lab Results  Component Value Date   WBC 4.8 07/03/2020   HGB 10.6 (L) 07/03/2020   HCT 33.6 (L) 07/03/2020   MCV 107.3 (H) 07/03/2020   PLT 67 (L) 07/03/2020   NEUTROABS 2.8 06/23/2020    DG Chest 2 View  Result Date: 06/26/2020 CLINICAL DATA:  Dyspnea. EXAM: CHEST - 2 VIEW COMPARISON:  Chest CT 05/22/2020 FINDINGS: Right chest port remains in place. Hazy opacity at the left lung base consistent with pleural effusion and airspace disease/atelectasis. Right lung is clear. Upper normal heart size. Mild right hilar prominence likely secondary to combination of vascular structures and adenopathy is seen on recent CT. Fullness in the upper mediastinum likely secondary to adenopathy. No pneumothorax. No pulmonary edema. Surgical clips in the left axilla. IMPRESSION: 1. Hazy opacity at the left lung base consistent with pleural effusion and airspace disease/atelectasis. 2. Mild right hilar prominence likely secondary to combination of vascular structures and adenopathy seen on recent CT. Electronically Signed   By: Keith Rake M.D.   On: 06/26/2020 15:54   CT Abdomen Pelvis W Contrast  Result Date: 06/23/2020 CLINICAL DATA:  86 year old female with abdominal pain, nausea and vomiting. History of lymphoma. EXAM: CT  ABDOMEN AND PELVIS WITH CONTRAST TECHNIQUE: Multidetector CT imaging of the abdomen and pelvis was performed using the standard protocol following bolus administration of intravenous contrast. CONTRAST:  42m OMNIPAQUE IOHEXOL 300 MG/ML  SOLN COMPARISON:  CT of the chest abdomen pelvis dated 05/22/2020. FINDINGS: Lower chest: Partially visualized moderate left and trace right pleural effusions, new since the prior CT. There is associated partial compressive atelectasis of the left lower lobe. Pneumonia is not excluded. Clinical correlation is recommended. No intra-abdominal free air. Interval development of a small ascites, new or significantly increased since the prior CT. Hepatobiliary: A 1 cm hypodense focus along the posterior liver capsule is not characterized but may represent a cyst or focal area of scarring. This is similar to prior CT. Subcentimeter hypodense focus in the anterior liver is too small to characterize. There is mild intrahepatic biliary ductal dilatation, likely post cholecystectomy. The common bile duct is dilated measuring 14 mm. No retained calcified stone noted in the central CBD. Pancreas: The pancreas is unremarkable as visualized. Spleen: Mildly enlarged spleen measuring 15 cm in craniocaudal length. Adrenals/Urinary Tract: The adrenal glands  are poorly visualized but grossly unremarkable. There is mild fullness of the renal collecting systems bilaterally without frank hydronephrosis. Mild hazy appearance of the urothelium noted. Correlation with urinalysis recommended to exclude UTI. There is symmetric enhancement and excretion of contrast by both kidneys. Subcentimeter left renal hypodense focus is not characterized. The urinary bladder is partially distended. Probable chronic bladder wall thickening and perivesical stranding. Correlation with urinalysis recommended to exclude UTI. Stomach/Bowel: There is sigmoid diverticulosis without definite active inflammatory changes. There is  postsurgical changes of bowel with anastomotic suture in the right lower quadrant. No definite evidence of bowel obstruction. Evaluation however is limited in the absence of oral contrast. Vascular/Lymphatic: There is moderate aortoiliac atherosclerotic disease. The IVC is unremarkable as visualized. The SMV, splenic vein, and main portal vein are patent. No portal venous gas. Bulky retroperitoneal adenopathy encasing the abdominal aorta and IVC as well as enlarged bilateral iliac chain lymph nodes in keeping with history of lymphoma. Overall the degree of adenopathy is relatively similar to prior CT. Reproductive: The uterus is poorly visualized. Other: Diffuse mesenteric edema, new since the prior CT. There is scattered mesenteric adenopathy as seen previously. Musculoskeletal: Degenerative changes of the spine. Minimal compression fracture of the anterior superior and inferior endplates of the L3, new since the prior CT, likely acute or subacute. IMPRESSION: 1. Interval development of partially visualized moderate left and trace right pleural effusions, as well as development of diffuse mesenteric edema and ascites, new since the prior CT. 2. Bulky retroperitoneal and iliac chain adenopathy in keeping with history of lymphoma. Overall the degree of adenopathy is relatively similar to prior CT. 3. Mild splenomegaly. 4. Minimal compression fracture of the superior and inferior endplate of L3, likely acute or subacute. 5. Sigmoid diverticulosis. 6. Aortic Atherosclerosis (ICD10-I70.0). Electronically Signed   By: Anner Crete M.D.   On: 06/23/2020 16:39   [place] ASCITES (ABDOMEN LIMITED)  Result Date: 07/01/2020 CLINICAL DATA:  History of lymphoma, now with concern for symptomatic intra-abdominal ascites. Please perform ascites search ultrasound and ultrasound-guided paracentesis as indicated. EXAM: LIMITED ABDOMEN ULTRASOUND FOR ASCITES TECHNIQUE: Limited ultrasound survey for ascites was performed in all four  abdominal quadrants. COMPARISON:  CT abdomen pelvis-06/23/2020 FINDINGS: Sonographic evaluation demonstrates a trace amount of intra-abdominal ascites, too small to allow for safe ultrasound-guided paracentesis. Note is made of small bilateral pleural effusions, the right side of which appears new compared to abdominal CT performed 06/23/2020. IMPRESSION: 1. Trace amount of intra-abdominal ascites, too small to allow for safe ultrasound-guided paracentesis. No paracentesis attempted. 2. Small bilateral pleural effusions - the left-sided pleural effusion appears similar to abdominal CT performed 06/23/2020 however the right-sided pleural effusion appears new of the CT. Electronically Signed   By: Sandi Mariscal M.D.   On: 07/01/2020 15:11   US Abdomen Limited RUQ  Result Date: 07/02/2020 CLINICAL DATA:  Abnormal LFTs EXAM: ULTRASOUND ABDOMEN LIMITED RIGHT UPPER QUADRANT COMPARISON:  CT 06/23/2020.  Ultrasound 07/01/2020 FINDINGS: Gallbladder: Prior cholecystectomy Common bile duct: Diameter: Prominent measuring up to 10 mm, likely related to post cholecystectomy state. Liver: Heterogeneous, slightly increased echotexture. Subtle nodular contours to the liver surface. Findings raise the possibility of cirrhosis. No suspicious focal hepatic abnormality. Portal vein is patent on color Doppler imaging with normal direction of blood flow towards the liver. Other: Ascites and right effusion noted. Prominent porta hepatis lymph nodes. IMPRESSION: Heterogeneous, increased echotexture throughout the liver. Subtle nodular contour seen in some areas of the liver. Findings suggest the possibility of cirrhosis. Perihepatic  ascites and right pleural effusion. Mild biliary ductal dilatation, likely related to post cholecystectomy state. Prominent porta hepatis lymph nodes, likely related to liver disease. Electronically Signed   By: Rolm Baptise M.D.   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 Status post weekly Rituxan x4 03/01/2012 through 03/22/2012. She completed 4 "maintenance" doses of Rituxan, last on 12/19/2012. A restaging CT on 02/09/2013 showed no evidence of lymphoma.   Lymph node lateral to the thyroid bed on a neck ultrasound 02/21/2014, status post an FNA biopsy concerning for a lymphoproliferative disorder.  PET scan 09/28/2016 with active lymphoma within the neck, chest, abdomen, pelvis; splenic enlargement and hypermetabolism suspicious for splenic involvement.  Initiation of Rituxan weekly 4 09/29/2016  Initiation of maintenance Rituxan on a 3 month schedule 12/23/2016; final Rituxan 08/31/2018  Thyroid ultrasound 02/07/2019-left cervical lymphadenopathy  PET scan 03/08/2019-extensive recurrent hypermetabolic lymphoma involving the neck, chest, abdomen and pelvis.  03/16/2019 left cervical lymph node biopsy-features consistent with previously diagnosed non-Hodgkin's B-cell lymphoma, phenotypically consistent with marginal zone lymphoma. Flow cytometry with lambda restricted B-cell population without expression of CD5 or CD10 comprising 87% of all lymphocytes.  Cycle 1 bendamustine/Rituxan 03/22/2019  Excision deep left axillary lymph nodes 05/04/2019-non-Hodgkin's B-cell lymphoma with differential including a marginal zone lymphoma and atypical small lymphocytic lymphoma. Flow cytometry with monoclonal B-cell population without expression of CD5 or CD10, comprises 96% of all lymphocytes.  Cycle 1 [facility]OP/Rituxan 05/18/2019  Cycle 2 [facility]/Rituxan 06/08/2019  CTs 06/15/2019-partial improvement in diffuse adenopathy, stable mild splenomegaly  Cycle 1 Revlimid/rituximab 07/19/2019 (Revlimid start 07/20/2019)  Cycle 2 Revlimid/rituximab 08/16/2019 (Revlimid placed on hold 08/30/2019 due to neutropenia)  Cycle 3 of Revlimid/rituximab 09/13/2019  (Revlimid schedule changed to 14 days on/14 days off)  Cycle 4 Revlimid/rituximab 10/10/2019  Cycle 5 Revlimid/rituximab 11/07/2019  Cycle 6 Revlimid/rituximab 12/07/2019  CTs 12/28/2019-diffuse lymphadenopathy-slightly increased compared to 06/15/2019  Cycle 7 Revlimid/rituximab 01/04/2020  Cycle 8 Revlimid/Rituxan 02/01/2020  Cycle 9 Revlimid/Rituxan 03/05/2020  Cycle 10 Revlimid/Rituxan 04/02/2020  Cycle 11 Revlimid/Rituxan 04/30/2020  CT 05/22/2020-mild increase in left supraclavicular adenopathy, mild increase in chest, retroperitoneal, and pelvic adenopathy. Stable mild splenomegaly.  Acalabrutinib started 06/19/2020  CT abdomen/pelvis 06/23/2020-moderate left and trace right pleural effusions, new diffuse mesenteric edema and ascites, stable retroperitoneal and iliac adenopathy, mild splenomegaly 2. Stage IV (T1bN1b M0) papillary thyroid cancer, status post a thyroidectomy with reimplantation of the left superior parathyroid gland on 05/23/2012, status post radioactive iodine therapy, followed by Dr. Buddy Duty.  3. Stage II (T3 N0) colon cancer, status post a right colectomy 10/19/2011, last colonoscopy April 2015-sigmoid adenoma removed.  4. History of a pulmonary embolism December 2012.  5. History of Atrial fibrillation 6. Iron deficiency anemia-new 03/18/2014. Hemoccult positive stool. The anemia corrected with iron. No longer taking iron.  Status post an upper endoscopy and colonoscopy by Dr. Carlean Purl April 2015 with no bleeding source identified, benign adenoma removed from the sigmoid colon. 7. Report of an upper gastric intestinal bleed fall 2016-managed in [place]. [name] 8. Left knee replacement May 2017, repeat left knee surgery May 2018 9. Pruritic rash 07/22/2016 10. Nausea and diarrhea 04/02/2019-stool negative for C. difficile toxin 11. 06/18/2019 [facility] admission for symptomatic anemia. 12.  Admission 06/23/2020 with nausea and diarrhea  Ms. Cedeno is much more alert this  morning.  The altered mental status yesterday may have been related to polypharmacy, trazodone?.  I do  not recommend an MRI since her mental status is better and exam is nonfocal. She has an increased respiratory rate today.  I will order a repeat chest x-ray to follow-up on the pleural effusions.  She may benefit from a thoracentesis.  She will begin an incentive spirometer.  I recommended she get out of bed today.  The liver enzymes are better.  We will continue holding the acalabrutinib.  The thrombocytopenia is likely related to acalabrutinib.  Recommendations: 1.  Hold acalabrutinib 2.  Chest x-ray    LOS: 9 days   Tracy Mclaughlin 07/03/20

## 2020-07-03 NOTE — Progress Notes (Signed)
PHARMACIST - PHYSICIAN COMMUNICATION  DR:   Lupita Leash  CONCERNING: IV to Oral Route Change Policy  RECOMMENDATION: This patient is receiving pantoprazole by the intravenous route.  Based on criteria approved by the Pharmacy and Therapeutics Committee, the intravenous medication(s) is/are being converted to the equivalent oral dose form(s).   DESCRIPTION: These criteria include:  The patient is eating (either orally or via tube) and/or has been taking other orally administered medications for a least 24 hours  The patient has no evidence of active gastrointestinal bleeding or impaired GI absorption (gastrectomy, short bowel, patient on TNA or NPO).  If you have questions about this conversion, please contact the Pharmacy Department  []   [phone] )  [facility] []   [phone] )  [facility] []   [phone] )  [facility] []   [phone] )  [facility] [x]   [phone] )  [place], [facility] 07/03/2020 10:49 AM

## 2020-07-04 ENCOUNTER — Inpatient Hospital Stay (HOSPITAL_COMMUNITY): Payer: Medicare PPO

## 2020-07-04 LAB — CBC
HCT: 34.7 % — ABNORMAL LOW (ref 36.0–46.0)
Hemoglobin: 11.2 g/dL — ABNORMAL LOW (ref 12.0–15.0)
MCH: 34.7 pg — ABNORMAL HIGH (ref 26.0–34.0)
MCHC: 32.3 g/dL (ref 30.0–36.0)
MCV: 107.4 fL — ABNORMAL HIGH (ref 80.0–100.0)
Platelets: 77 10*3/uL — ABNORMAL LOW (ref 150–400)
RBC: 3.23 MIL/uL — ABNORMAL LOW (ref 3.87–5.11)
RDW: 15.3 % (ref 11.5–15.5)
WBC: 4.6 10*3/uL (ref 4.0–10.5)
nRBC: 0 % (ref 0.0–0.2)

## 2020-07-04 LAB — BODY FLUID CELL COUNT WITH DIFFERENTIAL
Eos, Fluid: 0 %
Lymphs, Fluid: 70 %
Monocyte-Macrophage-Serous Fluid: 30 % — ABNORMAL LOW (ref 50–90)
Neutrophil Count, Fluid: 0 % (ref 0–25)
Total Nucleated Cell Count, Fluid: 2121 cu mm — ABNORMAL HIGH (ref 0–1000)

## 2020-07-04 LAB — COMPREHENSIVE METABOLIC PANEL
ALT: 59 U/L — ABNORMAL HIGH (ref 0–44)
AST: 130 U/L — ABNORMAL HIGH (ref 15–41)
Albumin: 2.8 g/dL — ABNORMAL LOW (ref 3.5–5.0)
Alkaline Phosphatase: 175 U/L — ABNORMAL HIGH (ref 38–126)
Anion gap: 7 (ref 5–15)
BUN: 21 mg/dL (ref 8–23)
CO2: 27 mmol/L (ref 22–32)
Calcium: 7 mg/dL — ABNORMAL LOW (ref 8.9–10.3)
Chloride: 103 mmol/L (ref 98–111)
Creatinine, Ser: 0.74 mg/dL (ref 0.44–1.00)
GFR calc Af Amer: >60 mL/min (ref >60)
GFR calc non Af Amer: >60 mL/min (ref >60)
Glucose, Bld: 101 mg/dL — ABNORMAL HIGH (ref 70–99)
Potassium: 3.9 mmol/L (ref 3.5–5.1)
Sodium: 137 mmol/L (ref 135–145)
Total Bilirubin: 0.8 mg/dL (ref 0.3–1.2)
Total Protein: 5.4 g/dL — ABNORMAL LOW (ref 6.5–8.1)

## 2020-07-04 MED ORDER — LIDOCAINE HCL 1 % IJ SOLN
INTRAMUSCULAR | Status: AC
  Filled 2020-07-04: qty 20

## 2020-07-04 NOTE — Progress Notes (Signed)
PROGRESS NOTE    Tracy Mclaughlin  XQJ:[id number] DOB: 03/27/1934 DOA: 06/23/2020 PCP: [name], PA   Chef Complaints:  nausea  Brief Narrative: 86 y.o.femalewith medical history significant forlow-grade lymphoma without significant response to chemotherapy last year and was recently started onBTK INHIBITORby Dr. [name], hypothyroidism, history of A. fib, pancytopenia presents to the ED with3-4 day history of nausea vomiting and diarrhea and abdominal pain-and right upper quadrant.symptomts started after new chemo on Thursday.Patient denies any fever chest pain.vomitted x3 today and had non bloody diarrhea 4 times today.  ED Course:Hemodynamically stable, afebrile, UA positive for UTI,CT scan abdomen done that showed free fluid with ascites and mesenteric edema, case was discussed Dr. [name] from oncology advised UTI treatment with Zosyn and he will see the patient. Patient  admitted, being managed symptomatically chemo was held diarrhea improved subsequently oncology put the patient back on chemo overall tolerating well. Having nausea anorexia suspecting this is related to her lymphoma. Patient has been started on prednisone to help with appetite. Patient remains deconditioned with at times nauseous, poor appetite, intermittently confused. She has been short of breath dyspnea with activity  Subjective:  Complains of shortness of breath but no nausea vomiting.  Still feels unwell and weak.  Assessment & Plan:  Intractable nausea vomitingdiarrhea andabdominal pain: Unclear etiology suspecting due to lymphoma.  Chemo- Acalabrutinib was held on admission and resumed 7/8.  Diarrhea improved. Placed on prednisone to help with low appetite.She has new onset ascites on the CT scan-but too small to tap on the ultrasound unable to do paracentesis.  Continue diet as tolerated.  Remains deconditioned.  Hypothyroidism due to confusion.  Monitor LFTs.  Pleural effusion x-ray shows  worsening right pleural effusion also with some left pleural effusion.  Planning for ultrasound-guided thoracentesis as patient was symptomatic with shortness of breath.  Ascites:new onset ascites on the CT scan-but too small to tap on the ultrasound unable to do paracentesis.  Abnormal LFTS: Likely due to chemo and chemo has been held, LFTs is improving, right upper quadrant ultrasound possible cirrhotic changes, hepatitis panel negative.   Acute metabolic encephalopathy from polypharmacy: had  confusion/Lethargy: 7/14 a.m. no more confusion after stopping her prednisone trazodone. Ammonia B12 level normal.  Continue hydration symptomatic management.     Hypokalemia -repleted.    E coli UTI POA: Completed 7 days of antibiotic's.   Non Hodgkin's lymphoma-with progressive decline, patientwasstartedpast weekonAcalabrutinib she did not have significant response to chemotherapy last year.  Acalabrutinib was held on admission and resumed and has no recurrence of diarrhea.  Again has been discontinued due to worsening LFTs.    Failure to thrive/deconditioning: Continue PT OT and rehabilitation. SNF versus home health continue to work with PT OT.  Hypertension: BP stable on Cardizem.    Pancytopenia with anemia/Thrombocytopenia in the setting of lymphoma/chemo: Platelet slightly uptrending, chemo on hold.  Off heparin.  Monitor counts closely.  Recent Labs  Lab 07/02/20 0409 07/03/20 0325 07/04/20 0345  HGB 11.6* 10.6* 11.2*  HCT 34.1* 33.6* 34.7*  WBC 4.5 4.8 4.6  PLT 77* 67* 77*   History of intermittent atrial fibrillation on Cardizem, not on anticoagulation at home.  Bruise on the skin heparin discontinued per oncology.  Platelets are low , will monitor need to monitor closely  DVT prophylaxis: Place and maintain sequential compression device Start: 06/30/20 0901 Heparin stopped due to  extensive LE bruise 7/12 Code Status: DNR Family Communication: plan of care discussed with  patient at bedside.  Husband on the phone while seeing the patient this morning.   Status is: Inpatient Remains inpatient appropriate because: Patient remains symptomatic, nauseous, with failure to thrive. Dispo: The patient is from: Home               Anticipated d/c is to: TBD: SNF vs HHC.  Continue to work with PT. seems to be declining home meds.              Anticipated d/c date is: 1-2 days              Patient currently is not medically stable to d/c.  Planning for thoracentesis today, await for oncology clearance for discharge home.  Patient has been deconditioned and frail failure to thrive.  High risk for readmission.  Nutrition: Diet Order            Diet regular Room service appropriate? Yes; Fluid consistency: Thin  Diet effective now                Body mass index is 26.46 kg/m.  Consultants:see note  Procedures:see note Microbiology:see note  Medications: Scheduled Meds: . Chlorhexidine Gluconate Cloth  6 each Topical Daily  . citalopram  40 mg Oral Daily  . diltiazem  180 mg Oral QHS  . levothyroxine  224 mcg Oral Q0600  . lidocaine      . melatonin  3 mg Oral QHS  . pantoprazole  40 mg Oral Daily   Continuous Infusions: . sodium chloride 10 mL/hr at 06/29/20 0600  . sodium chloride 75 mL/hr at 07/04/20 0911    Antimicrobials: Anti-infectives (From admission, onward)   Start     Dose/Rate Route Frequency Ordered Stop   06/27/20 0830  cefTRIAXone (ROCEPHIN) 1 g in sodium chloride 0.9 % 100 mL IVPB  Status:  Discontinued        1 g 200 mL/hr over 30 Minutes Intravenous Every 24 hours 06/27/20 0733 06/30/20 1340   06/24/20 0200  piperacillin-tazobactam (ZOSYN) IVPB 3.375 g  Status:  Discontinued        3.375 g 12.5 mL/hr over 240 Minutes Intravenous Every 8 hours 06/23/20 1703 06/27/20 0733   06/23/20 1645  piperacillin-tazobactam (ZOSYN) IVPB 3.375 g        3.375 g 100 mL/hr over 30 Minutes Intravenous  Once 06/23/20 1638 06/23/20 1834   06/23/20 1615   cefTRIAXone (ROCEPHIN) 1 g in sodium chloride 0.9 % 100 mL IVPB  Status:  Discontinued        1 g 200 mL/hr over 30 Minutes Intravenous  Once 06/23/20 1607 06/23/20 1608       Objective: Vitals: Today's Vitals   07/04/20 0839 07/04/20 1045 07/04/20 1116 07/04/20 1318  BP:  (!) 124/117 104/64 (!) 122/43  Pulse:  (!) 117  79  Resp:      Temp:    98.9 F (37.2 C)  TempSrc:    Oral  SpO2: 94%   97%  Weight:      Height:      PainSc:        Intake/Output Summary (Last 24 hours) at 07/04/2020 1323 Last data filed at 07/04/2020 0909 Gross per 24 hour  Intake 2372 ml  Output 601 ml  Net 1771 ml   Filed Weights   06/23/20 1206  Weight: 78.9 kg   Weight change:    Intake/Output from previous day: 07/15 0701 - 07/16 0700 In: 2492 [P.O.:660; I.V.:1832] Out: 601 [Urine:600; Stool:1] Intake/Output this shift: Total I/O In:  240 [P.O.:240] Out: 0   Examination:  General exam: AAOx3, pleasant,NAD, weak appearing. HEENT:Oral mucosa moist, Ear/Nose WNL grossly, dentition normal. Respiratory system: bilaterally diminished breath sounds at the bases,no wheezing or crackles,no use of accessory muscle Cardiovascular system:S1 & S2 +,No JVD. Gastrointestinal system:Abdomen soft,NT,ND,BS+ Nervous System:Alert,awake, moving extremities and grossly nonfocal Extremities: No edema,distal peripheral pulses palpable.  Skin: No rashes,no icterus. MSK: Normal muscle bulk,tone, power.  Data Reviewed: I have personally reviewed following labs and imaging studies CBC: Recent Labs  Lab 07/02/20 0409 07/03/20 0325 07/04/20 0345  WBC 4.5 4.8 4.6  HGB 11.6* 10.6* 11.2*  HCT 34.1* 33.6* 34.7*  MCV 103.3* 107.3* 107.4*  PLT 77* 67* 77*   Basic Metabolic Panel: Recent Labs  Lab 06/28/20 0818 06/29/20 0822 07/02/20 0409 07/03/20 0325 07/04/20 0345  NA 133* 136 136 136 137  K 3.3* 3.5 3.7 3.3* 3.9  CL 96* 97* 97* 99 103  CO2 28 29 26 28 27   GLUCOSE 106* 121* 109* 92 101*  BUN 7* 8  22 25* 21  CREATININE 0.70 0.67 1.03* 0.92 0.74  CALCIUM 7.8* 7.9* 7.6* 7.0* 7.0*   GFR: Estimated Creatinine Clearance: 58.8 mL/min (by C-G formula based on SCr of 0.74 mg/dL). Liver Function Tests: Recent Labs  Lab 06/28/20 0818 07/02/20 0409 07/03/20 0325 07/04/20 0345  AST 102* 331* 192* 130*  ALT 33 115* 77* 59*  ALKPHOS 136* 137* 160* 175*  BILITOT 1.1 0.8 0.8 0.8  PROT 6.2* 5.4* 5.0* 5.4*  ALBUMIN 3.5 3.0* 2.6* 2.8*   No results for input(s): LIPASE, AMYLASE in the last 168 hours. Recent Labs  Lab 07/02/20 1623  AMMONIA 26   Coagulation Profile: No results for input(s): INR, PROTIME in the last 168 hours. Cardiac Enzymes: No results for input(s): CKTOTAL, CKMB, CKMBINDEX, TROPONINI in the last 168 hours. BNP (last 3 results) No results for input(s): PROBNP in the last 8760 hours. HbA1C: No results for input(s): HGBA1C in the last 72 hours. CBG: No results for input(s): GLUCAP in the last 168 hours. Lipid Profile: No results for input(s): CHOL, HDL, LDLCALC, TRIG, CHOLHDL, LDLDIRECT in the last 72 hours. Thyroid Function Tests: No results for input(s): TSH, T4TOTAL, FREET4, T3FREE, THYROIDAB in the last 72 hours. Anemia Panel: Recent Labs    07/02/20 1623  VITAMINB12 505   Sepsis Labs: No results for input(s): PROCALCITON, LATICACIDVEN in the last 168 hours.  No results found for this or any previous visit (from the past 240 hour(s)).    Radiology Studies: DG Chest 1 View  Result Date: 07/04/2020 CLINICAL DATA:  Status post right thoracentesis. EXAM: CHEST  1 VIEW COMPARISON:  Chest x-ray from yesterday. FINDINGS: Unchanged right chest wall port catheter. The heart size and mediastinal contours are within normal limits. Normal pulmonary vascularity. Trace residual right pleural effusion status post thoracentesis. Improved aeration at the right lung base. No pneumothorax. Unchanged small left pleural effusion and basilar atelectasis. No acute osseous  abnormality. IMPRESSION: 1. Trace residual right pleural effusion status post thoracentesis. No pneumothorax. 2. Unchanged small left pleural effusion and basilar atelectasis. Electronically Signed   By: Titus Dubin M.D.   On: 07/04/2020 11:42   DG Chest 2 View  Result Date: 07/03/2020 CLINICAL DATA:  Dyspnea, pleural effusions EXAM: CHEST - 2 VIEW COMPARISON:  06/26/2020 FINDINGS: Frontal and lateral views of the chest demonstrates stable right chest wall port. Cardiac silhouette is unremarkable. There is now a moderate right pleural effusion with right basilar consolidation. Decreased left pleural effusion,  with only trace fluid at the left costophrenic angle. Persistent retrocardiac consolidation. No pneumothorax. No acute bony abnormalities. IMPRESSION: 1. Decreased left pleural effusion, with persistent left basilar consolidation. 2. Increasing right pleural effusion, with increasing right basilar consolidation. Electronically Signed   By: Randa Ngo M.D.   On: 07/03/2020 16:11     LOS: 10 days   Antonieta Pert, MD [facility]  07/04/2020, 1:23 PM

## 2020-07-04 NOTE — Procedures (Signed)
Ultrasound-guided diagnostic and therapeutic right thoracentesis performed yielding 1.6 liters of blood-tinged fluid. No immediate complications. Follow-up chest x-ray pending. A portion of the fluid was sent to the lab for preordered studies. EBL< 1 cc.

## 2020-07-04 NOTE — Progress Notes (Signed)
HEMATOLOGY-ONCOLOGY PROGRESS NOTE  SUBJECTIVE: She is alert, appears short of breath.  Oncology History  Non Hodgkin's lymphoma ([place])  09/29/2016 Initial Diagnosis   Non Hodgkin's lymphoma ([place])   03/22/2019 - 04/23/2019 Chemotherapy   The patient had palonosetron (ALOXI) injection 0.25 mg, 0.25 mg, Intravenous,  Once, 1 of 4 cycles Administration: 0.25 mg (03/22/2019) riTUXimab (RITUXAN) 800 mg in sodium chloride 0.9 % 250 mL (2.4242 mg/mL) infusion, 375 mg/m2 = 800 mg, Intravenous,  Once, 1 of 4 cycles Administration: 800 mg (03/22/2019) bendamustine (BENDEKA) 150 mg in sodium chloride 0.9 % 50 mL (2.6786 mg/mL) chemo infusion, 70 mg/m2 = 150 mg (100 % of original dose 70 mg/m2), Intravenous,  Once, 1 of 4 cycles Dose modification: 70 mg/m2 (original dose 70 mg/m2, Cycle 1, Reason: Provider Judgment) Administration: 150 mg (03/22/2019), 150 mg (03/23/2019)  for chemotherapy treatment.    05/18/2019 - 06/28/2019 Chemotherapy   The patient had DOXOrubicin (ADRIAMYCIN) chemo injection 82 mg, 40 mg/m2 = 82 mg (100 % of original dose 40 mg/m2), Intravenous,  Once, 2 of 6 cycles Dose modification: 40 mg/m2 (original dose 40 mg/m2, Cycle 1, Reason: Provider Judgment) Administration: 82 mg (05/18/2019), 82 mg (06/08/2019) palonosetron (ALOXI) injection 0.25 mg, 0.25 mg, Intravenous,  Once, 2 of 6 cycles Administration: 0.25 mg (05/18/2019), 0.25 mg (06/08/2019) pegfilgrastim (NEULASTA ONPRO KIT) injection 6 mg, 6 mg, Subcutaneous, Once, 2 of 6 cycles Administration: 6 mg (05/18/2019), 6 mg (06/08/2019) vinCRIStine (ONCOVIN) 1 mg in sodium chloride 0.9 % 50 mL chemo infusion, 1 mg (100 % of original dose 1 mg), Intravenous,  Once, 2 of 6 cycles Dose modification: 1 mg (original dose 1 mg, Cycle 1, Reason: Provider Judgment) Administration: 1 mg (05/18/2019), 1 mg (06/08/2019) riTUXimab (RITUXAN) 800 mg in sodium chloride 0.9 % 250 mL (2.4242 mg/mL) infusion, 375 mg/m2 = 800 mg, Intravenous,  Once, 2 of 6  cycles Administration: 800 mg (05/18/2019), 800 mg (06/08/2019) cyclophosphamide (CYTOXAN) 1,240 mg in sodium chloride 0.9 % 250 mL chemo infusion, 600 mg/m2 = 1,540 mg, Intravenous,  Once, 2 of 6 cycles Dose modification: 600 mg/m2 (original dose 750 mg/m2, Cycle 2, Reason: Provider Judgment) Administration: 1,240 mg (05/18/2019), 1,240 mg (06/08/2019) fosaprepitant (EMEND) 150 mg, dexamethasone (DECADRON) 12 mg in sodium chloride 0.9 % 145 mL IVPB, , Intravenous,  Once, 2 of 6 cycles Administration:  (05/18/2019),  (06/08/2019)  for chemotherapy treatment.    07/19/2019 -  Chemotherapy   The patient had riTUXimab (RITUXAN) 700 mg in sodium chloride 0.9 % 250 mL (2.1875 mg/mL) infusion, 375 mg/m2 = 700 mg, Intravenous,  Once, 11 of 11 cycles Administration: 700 mg (07/19/2019), 700 mg (08/16/2019), 700 mg (09/13/2019), 700 mg (10/10/2019), 700 mg (11/07/2019), 700 mg (12/07/2019), 700 mg (01/04/2020), 700 mg (02/01/2020), 700 mg (03/05/2020), 700 mg (04/02/2020), 700 mg (04/30/2020) riTUXimab-pvvr (RUXIENCE) 700 mg in sodium chloride 0.9 % 250 mL (2.1875 mg/mL) infusion, 375 mg/m2 = 700 mg (original dose ), Intravenous,  Once, 0 of 1 cycle Dose modification: 375 mg/m2 (Cycle 12)  for chemotherapy treatment.     PHYSICAL EXAMINATION:  Vitals:   07/04/20 1116 07/04/20 1318  BP: 104/64 (!) 122/43  Pulse:  79  Resp:    Temp:  98.9 F (37.2 C)  SpO2:  97%   Filed Weights   06/23/20 1206  Weight: 174 lb (78.9 kg)    Intake/Output from previous day: 07/15 0701 - 07/16 0700 In: 2492 [P.O.:660; I.V.:1832] Out: 601 [Urine:600; Stool:1]  GENERAL: Alert HEENT: No thrush LYMPH: Soft mobile  1-2 cm left cervical and left axillary nodes LUNGS: Increased respiratory rate, decreased breath sounds throughout the right chest, bilateral expiratory wheezing HEART: regular rate & rhythm and no murmurs and no lower extremity edema ABDOMEN: Nontender, no mass, mildly distended SKIN: Multiple ecchymoses noted on  her bilateral arms, legs, and trunk NEURO: Alert, follows commands, moves all extremities, speaking appropriately LABORATORY DATA:  I have reviewed the data as listed CMP Latest Ref Rng & Units 07/04/2020 07/03/2020 07/02/2020  Glucose 70 - 99 mg/dL 101(H) 92 109(H)  BUN 8 - 23 mg/dL 21 25(H) 22  Creatinine 0.44 - 1.00 mg/dL 0.74 0.92 1.03(H)  Sodium 135 - 145 mmol/L 137 136 136  Potassium 3.5 - 5.1 mmol/L 3.9 3.3(L) 3.7  Chloride 98 - 111 mmol/L 103 99 97(L)  CO2 22 - 32 mmol/L '27 28 26  ' Calcium 8.9 - 10.3 mg/dL 7.0(L) 7.0(L) 7.6(L)  Total Protein 6.5 - 8.1 g/dL 5.4(L) 5.0(L) 5.4(L)  Total Bilirubin 0.3 - 1.2 mg/dL 0.8 0.8 0.8  Alkaline Phos 38 - 126 U/L 175(H) 160(H) 137(H)  AST 15 - 41 U/L 130(H) 192(H) 331(H)  ALT 0 - 44 U/L 59(H) 77(H) 115(H)    Lab Results  Component Value Date   WBC 4.6 07/04/2020   HGB 11.2 (L) 07/04/2020   HCT 34.7 (L) 07/04/2020   MCV 107.4 (H) 07/04/2020   PLT 77 (L) 07/04/2020   NEUTROABS 2.8 06/23/2020    DG Chest 1 View  Result Date: 07/04/2020 CLINICAL DATA:  Status post right thoracentesis. EXAM: CHEST  1 VIEW COMPARISON:  Chest x-ray from yesterday. FINDINGS: Unchanged right chest wall port catheter. The heart size and mediastinal contours are within normal limits. Normal pulmonary vascularity. Trace residual right pleural effusion status post thoracentesis. Improved aeration at the right lung base. No pneumothorax. Unchanged small left pleural effusion and basilar atelectasis. No acute osseous abnormality. IMPRESSION: 1. Trace residual right pleural effusion status post thoracentesis. No pneumothorax. 2. Unchanged small left pleural effusion and basilar atelectasis. Electronically Signed   By: Titus Dubin M.D.   On: 07/04/2020 11:42   DG Chest 2 View  Result Date: 07/03/2020 CLINICAL DATA:  Dyspnea, pleural effusions EXAM: CHEST - 2 VIEW COMPARISON:  06/26/2020 FINDINGS: Frontal and lateral views of the chest demonstrates stable right chest wall  port. Cardiac silhouette is unremarkable. There is now a moderate right pleural effusion with right basilar consolidation. Decreased left pleural effusion, with only trace fluid at the left costophrenic angle. Persistent retrocardiac consolidation. No pneumothorax. No acute bony abnormalities. IMPRESSION: 1. Decreased left pleural effusion, with persistent left basilar consolidation. 2. Increasing right pleural effusion, with increasing right basilar consolidation. Electronically Signed   By: Randa Ngo M.D.   On: 07/03/2020 16:11   DG Chest 2 View  Result Date: 06/26/2020 CLINICAL DATA:  Dyspnea. EXAM: CHEST - 2 VIEW COMPARISON:  Chest CT 05/22/2020 FINDINGS: Right chest port remains in place. Hazy opacity at the left lung base consistent with pleural effusion and airspace disease/atelectasis. Right lung is clear. Upper normal heart size. Mild right hilar prominence likely secondary to combination of vascular structures and adenopathy is seen on recent CT. Fullness in the upper mediastinum likely secondary to adenopathy. No pneumothorax. No pulmonary edema. Surgical clips in the left axilla. IMPRESSION: 1. Hazy opacity at the left lung base consistent with pleural effusion and airspace disease/atelectasis. 2. Mild right hilar prominence likely secondary to combination of vascular structures and adenopathy seen on recent CT. Electronically Signed   By: Threasa Beards  Sanford M.D.   On: 06/26/2020 15:54   CT Abdomen Pelvis W Contrast  Result Date: 06/23/2020 CLINICAL DATA:  86 year old female with abdominal pain, nausea and vomiting. History of lymphoma. EXAM: CT ABDOMEN AND PELVIS WITH CONTRAST TECHNIQUE: Multidetector CT imaging of the abdomen and pelvis was performed using the standard protocol following bolus administration of intravenous contrast. CONTRAST:  32m OMNIPAQUE IOHEXOL 300 MG/ML  SOLN COMPARISON:  CT of the chest abdomen pelvis dated 05/22/2020. FINDINGS: Lower chest: Partially visualized  moderate left and trace right pleural effusions, new since the prior CT. There is associated partial compressive atelectasis of the left lower lobe. Pneumonia is not excluded. Clinical correlation is recommended. No intra-abdominal free air. Interval development of a small ascites, new or significantly increased since the prior CT. Hepatobiliary: A 1 cm hypodense focus along the posterior liver capsule is not characterized but may represent a cyst or focal area of scarring. This is similar to prior CT. Subcentimeter hypodense focus in the anterior liver is too small to characterize. There is mild intrahepatic biliary ductal dilatation, likely post cholecystectomy. The common bile duct is dilated measuring 14 mm. No retained calcified stone noted in the central CBD. Pancreas: The pancreas is unremarkable as visualized. Spleen: Mildly enlarged spleen measuring 15 cm in craniocaudal length. Adrenals/Urinary Tract: The adrenal glands are poorly visualized but grossly unremarkable. There is mild fullness of the renal collecting systems bilaterally without frank hydronephrosis. Mild hazy appearance of the urothelium noted. Correlation with urinalysis recommended to exclude UTI. There is symmetric enhancement and excretion of contrast by both kidneys. Subcentimeter left renal hypodense focus is not characterized. The urinary bladder is partially distended. Probable chronic bladder wall thickening and perivesical stranding. Correlation with urinalysis recommended to exclude UTI. Stomach/Bowel: There is sigmoid diverticulosis without definite active inflammatory changes. There is postsurgical changes of bowel with anastomotic suture in the right lower quadrant. No definite evidence of bowel obstruction. Evaluation however is limited in the absence of oral contrast. Vascular/Lymphatic: There is moderate aortoiliac atherosclerotic disease. The IVC is unremarkable as visualized. The SMV, splenic vein, and main portal vein are  patent. No portal venous gas. Bulky retroperitoneal adenopathy encasing the abdominal aorta and IVC as well as enlarged bilateral iliac chain lymph nodes in keeping with history of lymphoma. Overall the degree of adenopathy is relatively similar to prior CT. Reproductive: The uterus is poorly visualized. Other: Diffuse mesenteric edema, new since the prior CT. There is scattered mesenteric adenopathy as seen previously. Musculoskeletal: Degenerative changes of the spine. Minimal compression fracture of the anterior superior and inferior endplates of the L3, new since the prior CT, likely acute or subacute. IMPRESSION: 1. Interval development of partially visualized moderate left and trace right pleural effusions, as well as development of diffuse mesenteric edema and ascites, new since the prior CT. 2. Bulky retroperitoneal and iliac chain adenopathy in keeping with history of lymphoma. Overall the degree of adenopathy is relatively similar to prior CT. 3. Mild splenomegaly. 4. Minimal compression fracture of the superior and inferior endplate of L3, likely acute or subacute. 5. Sigmoid diverticulosis. 6. Aortic Atherosclerosis (ICD10-I70.0). Electronically Signed   By: AAnner CreteM.D.   On: 06/23/2020 16:39   [place]CITES (ABDOMEN LIMITED)  Result Date: 07/01/2020 CLINICAL DATA:  History of lymphoma, now with concern for symptomatic intra-abdominal ascites. Please perform ascites search ultrasound and ultrasound-guided paracentesis as indicated. EXAM: LIMITED ABDOMEN ULTRASOUND FOR ASCITES TECHNIQUE: Limited ultrasound survey for ascites was performed in all four abdominal quadrants. COMPARISON:  CT abdomen pelvis-06/23/2020 FINDINGS: Sonographic evaluation demonstrates a trace amount of intra-abdominal ascites, too small to allow for safe ultrasound-guided paracentesis. Note is made of small bilateral pleural effusions, the right side of which appears new compared to abdominal CT performed 06/23/2020.  IMPRESSION: 1. Trace amount of intra-abdominal ascites, too small to allow for safe ultrasound-guided paracentesis. No paracentesis attempted. 2. Small bilateral pleural effusions - the left-sided pleural effusion appears similar to abdominal CT performed 06/23/2020 however the right-sided pleural effusion appears new of the CT. Electronically Signed   By: Sandi Mariscal M.D.   On: 07/01/2020 15:11   US Abdomen Limited RUQ  Result Date: 07/02/2020 CLINICAL DATA:  Abnormal LFTs EXAM: ULTRASOUND ABDOMEN LIMITED RIGHT UPPER QUADRANT COMPARISON:  CT 06/23/2020.  Ultrasound 07/01/2020 FINDINGS: Gallbladder: Prior cholecystectomy Common bile duct: Diameter: Prominent measuring up to 10 mm, likely related to post cholecystectomy state. Liver: Heterogeneous, slightly increased echotexture. Subtle nodular contours to the liver surface. Findings raise the possibility of cirrhosis. No suspicious focal hepatic abnormality. Portal vein is patent on color Doppler imaging with normal direction of blood flow towards the liver. Other: Ascites and right effusion noted. Prominent porta hepatis lymph nodes. IMPRESSION: Heterogeneous, increased echotexture throughout the liver. Subtle nodular contour seen in some areas of the liver. Findings suggest the possibility of cirrhosis. Perihepatic ascites and right pleural effusion. Mild biliary ductal dilatation, likely related to post cholecystectomy state. Prominent porta hepatis lymph nodes, likely related to liver disease. Electronically Signed   By: Rolm Baptise M.D.   On: 07/02/2020 08:52   US THORACENTESIS ASP PLEURAL SPACE W/IMG GUIDE  Result Date: 07/04/2020 INDICATION: Patient with history of lymphoma, remote colon and thyroid cancers, dyspnea, right pleural effusion. Request made for diagnostic and therapeutic right thoracentesis. EXAM: ULTRASOUND GUIDED DIAGNOSTIC AND THERAPEUTIC RIGHT THORACENTESIS MEDICATIONS: 1% lidocaine to skin and subcutaneous tissue COMPLICATIONS: None  immediate. PROCEDURE: An ultrasound guided thoracentesis was thoroughly discussed with the patient and questions answered. The benefits, risks, alternatives and complications were also discussed. The patient understands and wishes to proceed with the procedure. Written consent was obtained. Ultrasound was performed to localize and mark an adequate pocket of fluid in the right chest. The area was then prepped and draped in the normal sterile fashion. 1% Lidocaine was used for local anesthesia. Under ultrasound guidance a 6 Fr Safe-T-Centesis catheter was introduced. Thoracentesis was performed. The catheter was removed and a dressing applied. FINDINGS: A total of approximately 1.6 liters of blood-tinged fluid was removed. Samples were sent to the laboratory as requested by the clinical team. IMPRESSION: Successful ultrasound guided diagnostic and therapeutic right thoracentesis yielding 1.6 liters of pleural fluid. Read by: Rowe Robert, PA-C Electronically Signed   By: Lucrezia Europe M.D.   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 Status post weekly Rituxan x4 03/01/2012 through 03/22/2012. She completed 4 "maintenance" doses of Rituxan, last on 12/19/2012. A restaging CT on 02/09/2013 showed no evidence of lymphoma.   Lymph node lateral to the thyroid bed on a neck ultrasound 02/21/2014, status post an FNA biopsy concerning for a lymphoproliferative disorder.  PET scan 09/28/2016 with active lymphoma within the neck, chest, abdomen, pelvis; splenic enlargement and hypermetabolism suspicious for splenic involvement.  Initiation of Rituxan weekly 4 09/29/2016  Initiation of maintenance Rituxan on a 3 month schedule 12/23/2016; final Rituxan 08/31/2018  Thyroid ultrasound 02/07/2019-left cervical lymphadenopathy  PET scan 03/08/2019-extensive  recurrent  hypermetabolic lymphoma involving the neck, chest, abdomen and pelvis.  03/16/2019 left cervical lymph node biopsy-features consistent with previously diagnosed non-Hodgkin's B-cell lymphoma, phenotypically consistent with marginal zone lymphoma. Flow cytometry with lambda restricted B-cell population without expression of CD5 or CD10 comprising 87% of all lymphocytes.  Cycle 1 bendamustine/Rituxan 03/22/2019  Excision deep left axillary lymph nodes 05/04/2019-non-Hodgkin's B-cell lymphoma with differential including a marginal zone lymphoma and atypical small lymphocytic lymphoma. Flow cytometry with monoclonal B-cell population without expression of CD5 or CD10, comprises 96% of all lymphocytes.  Cycle 1 [facility]/Rituxan 05/18/2019  Cycle 2 [facility]/Rituxan 06/08/2019  CTs 06/15/2019-partial improvement in diffuse adenopathy, stable mild splenomegaly  Cycle 1 Revlimid/rituximab 07/19/2019 (Revlimid start 07/20/2019)  Cycle 2 Revlimid/rituximab 08/16/2019 (Revlimid placed on hold 08/30/2019 due to neutropenia)  Cycle 3 of Revlimid/rituximab 09/13/2019 (Revlimid schedule changed to 14 days on/14 days off)  Cycle 4 Revlimid/rituximab 10/10/2019  Cycle 5 Revlimid/rituximab 11/07/2019  Cycle 6 Revlimid/rituximab 12/07/2019  CTs 12/28/2019-diffuse lymphadenopathy-slightly increased compared to 06/15/2019  Cycle 7 Revlimid/rituximab 01/04/2020  Cycle 8 Revlimid/Rituxan 02/01/2020  Cycle 9 Revlimid/Rituxan 03/05/2020  Cycle 10 Revlimid/Rituxan 04/02/2020  Cycle 11 Revlimid/Rituxan 04/30/2020  CT 05/22/2020-mild increase in left supraclavicular adenopathy, mild increase in chest, retroperitoneal, and pelvic adenopathy. Stable mild splenomegaly.  Acalabrutinib started 06/19/2020  CT abdomen/pelvis 06/23/2020-moderate left and trace right pleural effusions, new diffuse mesenteric edema and ascites, stable retroperitoneal and iliac adenopathy, mild splenomegaly 2. Stage IV (T1bN1b M0) papillary thyroid cancer,  status post a thyroidectomy with reimplantation of the left superior parathyroid gland on 05/23/2012, status post radioactive iodine therapy, followed by Dr. Buddy Duty.  3. Stage II (T3 N0) colon cancer, status post a right colectomy 10/19/2011, last colonoscopy April 2015-sigmoid adenoma removed.  4. History of a pulmonary embolism December 2012.  5. History of Atrial fibrillation 6. Iron deficiency anemia-new 03/18/2014. Hemoccult positive stool. The anemia corrected with iron. No longer taking iron.  Status post an upper endoscopy and colonoscopy by Dr. Carlean Purl April 2015 with no bleeding source identified, benign adenoma removed from the sigmoid colon. 7. Report of an upper gastric intestinal bleed fall 2016-managed in [place]. [name] 8. Left knee replacement May 2017, repeat left knee surgery May 2018 9. Pruritic rash 07/22/2016 10. Nausea and diarrhea 04/02/2019-stool negative for C. difficile toxin 11. 06/18/2019 [facility] admission for symptomatic anemia. 12.  Admission 06/23/2020 with nausea and diarrhea  Ms. Ogando remains alert, but she has developed increased respiratory distress.  The chest x-ray from yesterday reveals an increased right pleural effusion.  I will order a diagnostic/therapeutic thoracentesis.  I will request a cell count, culture, and cytology on the pleural fluid.  She may have a malignant effusion.  The liver enzymes are are improved again today.  Acalabrutinib will remain on hold.     Recommendations: 1.  Hold acalabrutinib 2.  Diagnostic/therapeutic thoracentesis 3.  Continue physical therapy evaluation to decide on need for skilled nursing facility placement 4.  Please call oncology as needed over the weekend.  I will check on her 07/07/2020.   I saw with Ms. Nuss early this morning and again this afternoon.  Her respiratory distress appeared much improved following the thoracentesis.  LOS: 10 days   Betsy Coder 07/04/20

## 2020-07-05 LAB — COMPREHENSIVE METABOLIC PANEL
ALT: 41 U/L (ref 0–44)
AST: 81 U/L — ABNORMAL HIGH (ref 15–41)
Albumin: 2.4 g/dL — ABNORMAL LOW (ref 3.5–5.0)
Alkaline Phosphatase: 164 U/L — ABNORMAL HIGH (ref 38–126)
Anion gap: 6 (ref 5–15)
BUN: 14 mg/dL (ref 8–23)
CO2: 27 mmol/L (ref 22–32)
Calcium: 6.8 mg/dL — ABNORMAL LOW (ref 8.9–10.3)
Chloride: 102 mmol/L (ref 98–111)
Creatinine, Ser: 0.57 mg/dL (ref 0.44–1.00)
GFR calc Af Amer: >60 mL/min (ref >60)
GFR calc non Af Amer: >60 mL/min (ref >60)
Glucose, Bld: 96 mg/dL (ref 70–99)
Potassium: 3.5 mmol/L (ref 3.5–5.1)
Sodium: 135 mmol/L (ref 135–145)
Total Bilirubin: 0.9 mg/dL (ref 0.3–1.2)
Total Protein: 4.7 g/dL — ABNORMAL LOW (ref 6.5–8.1)

## 2020-07-05 LAB — CBC
HCT: 31.6 % — ABNORMAL LOW (ref 36.0–46.0)
Hemoglobin: 10.1 g/dL — ABNORMAL LOW (ref 12.0–15.0)
MCH: 34.4 pg — ABNORMAL HIGH (ref 26.0–34.0)
MCHC: 32 g/dL (ref 30.0–36.0)
MCV: 107.5 fL — ABNORMAL HIGH (ref 80.0–100.0)
Platelets: 73 10*3/uL — ABNORMAL LOW (ref 150–400)
RBC: 2.94 MIL/uL — ABNORMAL LOW (ref 3.87–5.11)
RDW: 15.1 % (ref 11.5–15.5)
WBC: 3.5 10*3/uL — ABNORMAL LOW (ref 4.0–10.5)
nRBC: 0 % (ref 0.0–0.2)

## 2020-07-05 NOTE — Progress Notes (Signed)
PROGRESS NOTE    Armina Galloway  TDV:[id number] DOB: Aug 24, 1934 DOA: 06/23/2020 PCP: [name], PA   Chef Complaints:  nausea  Brief Narrative: 86 y.o.femalewith medical history significant forlow-grade lymphoma without significant response to chemotherapy last year and was recently started onBTK INHIBITORby Dr. [name], hypothyroidism, history of A. fib, pancytopenia presents to the ED with3-4 day history of nausea vomiting and diarrhea and abdominal pain-and right upper quadrant.symptomts started after new chemo on Thursday.Patient denies any fever chest pain.vomitted x3 today and had non bloody diarrhea 4 times today.  ED Course:Hemodynamically stable, afebrile, UA positive for UTI,CT scan abdomen done that showed free fluid with ascites and mesenteric edema, case was discussed Dr. [name] from oncology advised UTI treatment with Zosyn and he will see the patient. Patient  admitted, being managed symptomatically chemo was held diarrhea improved subsequently oncology put the patient back on chemo overall tolerating well. Having nausea anorexia suspecting this is related to her lymphoma. Patient has been started on prednisone to help with appetite. Patient remains deconditioned with at times nauseous, poor appetite, intermittently confused. She has been short of breath dyspnea with activity  Subjective:  Feels short of breath on moving Feels very weak and " I don't think I can go home, let me discuss with my husband about going to rehab" No nausea and vomiting  Assessment & Plan:  Intractable nausea vomitingdiarrhea andabdominal pain: Unclear etiology suspecting due to lymphoma.  Chemo- Acalabrutinib was held on admission and resumed 7/8.  Diarrhea improved. Placed on prednisone to help with low appetite.She has new onset ascites on the CT scan-but too small to tap on the ultrasound unable to do paracentesis.Continue diet as tolerated.She remains weak and  deconditioned and frail. Cont on supportive management,need for possible SNF.  Pleural effusion x-ray shows worsening right pleural effusion also with some left pleural effusion.  Status post right thoracentesis 1.6 L blood-tinged fluid was removed.  Pleural fluid Gram stain no organism, culture no growth so far  Ascites:new onset ascites on the CT scan-but too small to tap on the ultrasound unable to do paracentesis.  Abnormal LFTS: Likely due to chemo and chemo has been held, liver function test improving, RUQ [place]- possible cirrhotic changes, hepatitis panel negative.   Acute metabolic encephalopathy from polypharmacy: had  confusion/Lethargy: 7/14 a.m. no more confusion after stopping her prednisone trazodone. Ammonia B12 level normal.  She is mostly deconditioned weak and frail.  Will benefit with skilled nursing facility placement   Hypokalemia.: resolved.   Recent Labs  Lab 06/29/20 0822 07/02/20 0409 07/03/20 0325 07/04/20 0345 07/05/20 0437  K 3.5 3.7 3.3* 3.9 3.5   E coli UTI POA: Completed 7 days of antibiotic's.   Non Hodgkin's lymphoma-with progressive decline, patientwasstartedpast weekonAcalabrutinib she did not have significant response to chemotherapy last year.  Acalabrutinib was held on admission and resumed and has no recurrence of diarrhea. Chemo was discontinued due to worsening LFTs.    Failure to thrive/deconditioning: Remains weak and deconditioned.  She is interested to go to rehab.   SW consult. PT eval.  Hypertension: BP stable on Cardizem.    Pancytopenia with anemia/Thrombocytopenia in the setting of lymphoma/chemo: Platelet remains low in the 70,000 range.  Monitor.  Heparin is discontinued.   Recent Labs  Lab 07/03/20 0325 07/04/20 0345 07/05/20 0437  HGB 10.6* 11.2* 10.1*  HCT 33.6* 34.7* 31.6*  WBC 4.8 4.6 3.5*  PLT 67* 77* 73*   History of intermittent atrial fibrillation on Cardizem: Not on  AC.   Bruise on the skin heparin  discontinued per oncology.  Platelets are low , will monitor need to monitor closely  DVT prophylaxis: Place and maintain sequential compression device Start: 06/30/20 0901 Heparin stopped due to  extensive LE bruise 7/12 Code Status: DNR Family Communication: plan of care discussed with patient at bedside.husband was updated previously.  Status is: Inpatient Remains inpatient appropriate because: Patient remains symptomatic,nauseous, with failure to thrive. Dispo: The patient is from: Home               Anticipated d/c is to: TBD: Wants to discuss with husband for possible SNF placement.               Anticipated d/c date is: 1-2 days              Patient currently medically stable. SNF vs HHC  Nutrition: Diet Order            Diet regular Room service appropriate? Yes; Fluid consistency: Thin  Diet effective now                Body mass index is 26.46 kg/m.  Consultants:see note  Procedures:see note Microbiology:see note  Medications: Scheduled Meds: . Chlorhexidine Gluconate Cloth  6 each Topical Daily  . citalopram  40 mg Oral Daily  . diltiazem  180 mg Oral QHS  . levothyroxine  224 mcg Oral Q0600  . melatonin  3 mg Oral QHS  . pantoprazole  40 mg Oral Daily   Continuous Infusions: . sodium chloride 10 mL/hr at 06/29/20 0600  . sodium chloride 75 mL/hr at 07/04/20 2153    Antimicrobials: Anti-infectives (From admission, onward)   Start     Dose/Rate Route Frequency Ordered Stop   06/27/20 0830  cefTRIAXone (ROCEPHIN) 1 g in sodium chloride 0.9 % 100 mL IVPB  Status:  Discontinued        1 g 200 mL/hr over 30 Minutes Intravenous Every 24 hours 06/27/20 0733 06/30/20 1340   06/24/20 0200  piperacillin-tazobactam (ZOSYN) IVPB 3.375 g  Status:  Discontinued        3.375 g 12.5 mL/hr over 240 Minutes Intravenous Every 8 hours 06/23/20 1703 06/27/20 0733   06/23/20 1645  piperacillin-tazobactam (ZOSYN) IVPB 3.375 g        3.375 g 100 mL/hr over 30 Minutes  Intravenous  Once 06/23/20 1638 06/23/20 1834   06/23/20 1615  cefTRIAXone (ROCEPHIN) 1 g in sodium chloride 0.9 % 100 mL IVPB  Status:  Discontinued        1 g 200 mL/hr over 30 Minutes Intravenous  Once 06/23/20 1607 06/23/20 1608       Objective: Vitals: Today's Vitals   07/04/20 2011 07/04/20 2150 07/05/20 0643 07/05/20 0900  BP: 126/68  (!) 133/57   Pulse: 81  64   Resp: 18  18   Temp: (!) 97.5 F (36.4 C)  97.6 F (36.4 C)   TempSrc: Oral  Oral   SpO2: 96%  97%   Weight:      Height:      PainSc:  5   0-No pain    Intake/Output Summary (Last 24 hours) at 07/05/2020 1004 Last data filed at 07/05/2020 0615 Gross per 24 hour  Intake 1730 ml  Output 504 ml  Net 1226 ml   Filed Weights   06/23/20 1206  Weight: 78.9 kg   Weight change:    Intake/Output from previous day: 07/16 0701 - 07/17 0700 In: 2246.3 [  P.O.:1080; I.V.:1166.3] Out: 504 [Urine:503; Stool:1] Intake/Output this shift: No intake/output data recorded.  Examination: General exam: AAOX3,WEAK, FRAIL, NAD, weak appearing. HEENT:Oral mucosa moist, Ear/Nose WNL grossly, dentition normal. Respiratory system: bilaterally clear,no wheezing or crackles,no use of accessory muscle. Cardiovascular system: S1 & S2 +, No JVD. Gastrointestinal system: Abdomen soft, NT,ND, BS+. Nervous System:Alert, awake, moving extremities and grossly nonfocal. Extremities: No edema, distal peripheral pulses palpable.  Skin: No rashes,no icterus. MSK: Normal muscle bulk,tone, power  Data Reviewed: I have personally reviewed following labs and imaging studies CBC: Recent Labs  Lab 07/02/20 0409 07/03/20 0325 07/04/20 0345 07/05/20 0437  WBC 4.5 4.8 4.6 3.5*  HGB 11.6* 10.6* 11.2* 10.1*  HCT 34.1* 33.6* 34.7* 31.6*  MCV 103.3* 107.3* 107.4* 107.5*  PLT 77* 67* 77* 73*   Basic Metabolic Panel: Recent Labs  Lab 06/29/20 0822 07/02/20 0409 07/03/20 0325 07/04/20 0345 07/05/20 0437  NA 136 136 136 137 135  K 3.5  3.7 3.3* 3.9 3.5  CL 97* 97* 99 103 102  CO2 29 26 28 27 27   GLUCOSE 121* 109* 92 101* 96  BUN 8 22 25* 21 14  CREATININE 0.67 1.03* 0.92 0.74 0.57  CALCIUM 7.9* 7.6* 7.0* 7.0* 6.8*   GFR: Estimated Creatinine Clearance: 58.8 mL/min (by C-G formula based on SCr of 0.57 mg/dL). Liver Function Tests: Recent Labs  Lab 07/02/20 0409 07/03/20 0325 07/04/20 0345 07/05/20 0437  AST 331* 192* 130* 81*  ALT 115* 77* 59* 41  ALKPHOS 137* 160* 175* 164*  BILITOT 0.8 0.8 0.8 0.9  PROT 5.4* 5.0* 5.4* 4.7*  ALBUMIN 3.0* 2.6* 2.8* 2.4*   No results for input(s): LIPASE, AMYLASE in the last 168 hours. Recent Labs  Lab 07/02/20 1623  AMMONIA 26   Coagulation Profile: No results for input(s): INR, PROTIME in the last 168 hours. Cardiac Enzymes: No results for input(s): CKTOTAL, CKMB, CKMBINDEX, TROPONINI in the last 168 hours. BNP (last 3 results) No results for input(s): PROBNP in the last 8760 hours. HbA1C: No results for input(s): HGBA1C in the last 72 hours. CBG: No results for input(s): GLUCAP in the last 168 hours. Lipid Profile: No results for input(s): CHOL, HDL, LDLCALC, TRIG, CHOLHDL, LDLDIRECT in the last 72 hours. Thyroid Function Tests: No results for input(s): TSH, T4TOTAL, FREET4, T3FREE, THYROIDAB in the last 72 hours. Anemia Panel: Recent Labs    07/02/20 1623  VITAMINB12 505   Sepsis Labs: No results for input(s): PROCALCITON, LATICACIDVEN in the last 168 hours.  Recent Results (from the past 240 hour(s))  Body fluid culture     Status: None (Preliminary result)   Collection Time: 07/04/20 11:12 AM   Specimen: Lung, Right; Pleural Fluid  Result Value Ref Range Status   Specimen Description   Final    PLEURAL Performed at [facility] [address] [place], [place] [address]    Special Requests   Final    NONE Performed at [facility], [place] [address], [place], [place] [address]    Gram Stain   Final    FEW WBC  PRESENT, PREDOMINANTLY MONONUCLEAR NO ORGANISMS SEEN    Culture   Final    NO GROWTH < 24 HOURS Performed at [facility] [address], [place], [place] [address]    Report Status PENDING  Incomplete      Radiology Studies: DG Chest 1 View  Result Date: 07/04/2020 CLINICAL DATA:  Status post right thoracentesis. EXAM: CHEST  1 VIEW  COMPARISON:  Chest x-ray from yesterday. FINDINGS: Unchanged right chest wall port catheter. The heart size and mediastinal contours are within normal limits. Normal pulmonary vascularity. Trace residual right pleural effusion status post thoracentesis. Improved aeration at the right lung base. No pneumothorax. Unchanged small left pleural effusion and basilar atelectasis. No acute osseous abnormality. IMPRESSION: 1. Trace residual right pleural effusion status post thoracentesis. No pneumothorax. 2. Unchanged small left pleural effusion and basilar atelectasis. Electronically Signed   By: Titus Dubin M.D.   On: 07/04/2020 11:42   DG Chest 2 View  Result Date: 07/03/2020 CLINICAL DATA:  Dyspnea, pleural effusions EXAM: CHEST - 2 VIEW COMPARISON:  06/26/2020 FINDINGS: Frontal and lateral views of the chest demonstrates stable right chest wall port. Cardiac silhouette is unremarkable. There is now a moderate right pleural effusion with right basilar consolidation. Decreased left pleural effusion, with only trace fluid at the left costophrenic angle. Persistent retrocardiac consolidation. No pneumothorax. No acute bony abnormalities. IMPRESSION: 1. Decreased left pleural effusion, with persistent left basilar consolidation. 2. Increasing right pleural effusion, with increasing right basilar consolidation. Electronically Signed   By: Randa Ngo M.D.   On: 07/03/2020 16:11   US THORACENTESIS ASP PLEURAL SPACE W/IMG GUIDE  Result Date: 07/04/2020 INDICATION: Patient with history of lymphoma, remote colon and thyroid cancers, dyspnea, right pleural  effusion. Request made for diagnostic and therapeutic right thoracentesis. EXAM: ULTRASOUND GUIDED DIAGNOSTIC AND THERAPEUTIC RIGHT THORACENTESIS MEDICATIONS: 1% lidocaine to skin and subcutaneous tissue COMPLICATIONS: None immediate. PROCEDURE: An ultrasound guided thoracentesis was thoroughly discussed with the patient and questions answered. The benefits, risks, alternatives and complications were also discussed. The patient understands and wishes to proceed with the procedure. Written consent was obtained. Ultrasound was performed to localize and mark an adequate pocket of fluid in the right chest. The area was then prepped and draped in the normal sterile fashion. 1% Lidocaine was used for local anesthesia. Under ultrasound guidance a 6 Fr Safe-T-Centesis catheter was introduced. Thoracentesis was performed. The catheter was removed and a dressing applied. FINDINGS: A total of approximately 1.6 liters of blood-tinged fluid was removed. Samples were sent to the laboratory as requested by the clinical team. IMPRESSION: Successful ultrasound guided diagnostic and therapeutic right thoracentesis yielding 1.6 liters of pleural fluid. Read by: Rowe Robert, PA-C Electronically Signed   By: Lucrezia Europe M.D.   On: 07/04/2020 11:16     LOS: 11 days   Antonieta Pert, MD [facility]  07/05/2020, 10:04 AM

## 2020-07-06 ENCOUNTER — Inpatient Hospital Stay (HOSPITAL_COMMUNITY): Payer: Medicare PPO

## 2020-07-06 DIAGNOSIS — I10 Essential (primary) hypertension: Secondary | ICD-10-CM

## 2020-07-06 LAB — CBC
HCT: 33.8 % — ABNORMAL LOW (ref 36.0–46.0)
Hemoglobin: 10.9 g/dL — ABNORMAL LOW (ref 12.0–15.0)
MCH: 34.7 pg — ABNORMAL HIGH (ref 26.0–34.0)
MCHC: 32.2 g/dL (ref 30.0–36.0)
MCV: 107.6 fL — ABNORMAL HIGH (ref 80.0–100.0)
Platelets: 87 10*3/uL — ABNORMAL LOW (ref 150–400)
RBC: 3.14 MIL/uL — ABNORMAL LOW (ref 3.87–5.11)
RDW: 15.1 % (ref 11.5–15.5)
WBC: 3.2 10*3/uL — ABNORMAL LOW (ref 4.0–10.5)
nRBC: 0 % (ref 0.0–0.2)

## 2020-07-06 LAB — COMPREHENSIVE METABOLIC PANEL
ALT: 35 U/L (ref 0–44)
AST: 64 U/L — ABNORMAL HIGH (ref 15–41)
Albumin: 2.5 g/dL — ABNORMAL LOW (ref 3.5–5.0)
Alkaline Phosphatase: 166 U/L — ABNORMAL HIGH (ref 38–126)
Anion gap: 6 (ref 5–15)
BUN: 9 mg/dL (ref 8–23)
CO2: 28 mmol/L (ref 22–32)
Calcium: 7 mg/dL — ABNORMAL LOW (ref 8.9–10.3)
Chloride: 106 mmol/L (ref 98–111)
Creatinine, Ser: 0.53 mg/dL (ref 0.44–1.00)
GFR calc Af Amer: >60 mL/min (ref >60)
GFR calc non Af Amer: >60 mL/min (ref >60)
Glucose, Bld: 114 mg/dL — ABNORMAL HIGH (ref 70–99)
Potassium: 3.7 mmol/L (ref 3.5–5.1)
Sodium: 140 mmol/L (ref 135–145)
Total Bilirubin: 0.9 mg/dL (ref 0.3–1.2)
Total Protein: 5 g/dL — ABNORMAL LOW (ref 6.5–8.1)

## 2020-07-06 LAB — PROCALCITONIN: Procalcitonin: <0.1 ng/mL

## 2020-07-06 MED ORDER — SODIUM CHLORIDE 0.9 % IV SOLN
1.0000 g | INTRAVENOUS | Status: DC
  Administered 2020-07-06 – 2020-07-07 (×2): 1 g via INTRAVENOUS
  Filled 2020-07-06 (×2): qty 1

## 2020-07-06 MED ORDER — HEPARIN SOD (PORK) LOCK FLUSH 100 UNIT/ML IV SOLN
500.0000 [IU] | INTRAVENOUS | Status: DC | PRN
  Administered 2020-07-25: 500 [IU]
  Filled 2020-07-06 (×2): qty 5

## 2020-07-06 MED ORDER — HEPARIN SOD (PORK) LOCK FLUSH 100 UNIT/ML IV SOLN
500.0000 [IU] | INTRAVENOUS | Status: DC
  Administered 2020-07-06: 500 [IU]

## 2020-07-06 MED ORDER — AZITHROMYCIN 250 MG PO TABS
500.0000 mg | ORAL_TABLET | Freq: Every day | ORAL | Status: DC
  Administered 2020-07-06 – 2020-07-08 (×3): 500 mg via ORAL
  Filled 2020-07-06 (×5): qty 2

## 2020-07-06 NOTE — Progress Notes (Signed)
PROGRESS NOTE    Tracy Mclaughlin  ELF:[id number] DOB: 04-Sep-1934 DOA: 06/23/2020 PCP: [name], PA   Chef Complaints:  nausea  Brief Narrative: 86 y.o.femalewith medical history significant forlow-grade lymphoma without significant response to chemotherapy last year and was recently started onBTK INHIBITORby Dr. [name], hypothyroidism, history of A. fib, pancytopenia presents to the ED with3-4 day history of nausea vomiting and diarrhea and abdominal pain-and right upper quadrant.symptomts started after new chemo on Thursday.Patient denies any fever chest pain.vomitted x3 today and had non bloody diarrhea 4 times today.  ED Course:Hemodynamically stable, afebrile, UA positive for UTI,CT scan abdomen done that showed free fluid with ascites and mesenteric edema, case was discussed Dr. [name] from oncology advised UTI treatment with Zosyn and he will see the patient. Patient  admitted, being managed symptomatically chemo was held diarrhea improved subsequently oncology put the patient back on chemo overall tolerating well. Having nausea anorexia suspecting this is related to her lymphoma. Patient has been started on prednisone to help with appetite. Patient remains deconditioned with at times nauseous, poor appetite, intermittently confused. She has been short of breath dyspnea with activity  Subjective:  Denies nausea vomiting but is still short of breath.  And on pulse ox was 87% on room air placed on 2 L nasal cannula then to 1 l [place] She feels very weak and deconditioned.  Assessment & Plan:  Intractable nausea vomitingdiarrhea andabdominal pain: Unclear etiology suspecting due to lymphoma.  Chemo- Acalabrutinib was held on admission and resumed 7/8.  Diarrhea improved. Placed on prednisone to help with low appetite.She has new onset ascites on the CT scan-but too small to tap on the ultrasound unable to do paracentesis.Continue diet as tolerated.patient remains weak  and deconditioned.  Refusing to go to SNF.    Pleural effusion x-ray shows worsening right pleural effusion also with some left pleural effusion.  Status post right thoracentesis 1.6 L blood-tinged fluid was removed.  Pleural fluid Gram stain no organism, culture no growth so far. Repeated CXR today.  Acute hypoxic respiratory failure due to pleural effusion/? pneumonia. Repeat chest x-ray shows increased right lower lung zone opacity-? Possible pneumonia check procalcitonin.she is afebrile but with leukopenia.  . Will empirically start her on Rocephin/Azithromycin. May need repeat thoracentesis.  Ascites:new onset ascites on the CT scan-too small to tap on paracentesis per IR.    Abnormal LFTS: Likely due to chemo and chemo has been held, liver function test is improving. RUQ [place]- possible cirrhotic changes, hepatitis panel negative.   Acute metabolic encephalopathy from polypharmacy: had  confusion/Lethargy: 7/14 a.m likley polypharmacy and pt feels improved after stopping trazodone/prednisone. Ammonia B12 level normal.  She is mostly deconditioned weak and frail.  Will benefit with skilled nursing facility placement but refusing   Hypokalemia.: resolved.   Recent Labs  Lab 07/02/20 0409 07/03/20 0325 07/04/20 0345 07/05/20 0437 07/06/20 0510  K 3.7 3.3* 3.9 3.5 3.7   E coli UTI HEN:IDPOEUMPN 7 days of antibiotics.   Non Hodgkin's lymphoma-with progressive decline, patientwasstartedpast weekonAcalabrutinib she did not have significant response to chemotherapy last year.  Acalabrutinib was held on admission and resumed  But again stopped due to worsening LFTs by oncology.  Failure to thrive/deconditioning:Remains weak and deconditioned. She refused SNF.    Hypertension: BP stable on Cardizem.    Pancytopenia with anemia/Thrombocytopenia in the setting of lymphoma/chemo: Platelet count is slowly improving.Heparin is discontinued.   Recent Labs  Lab 07/04/20 0345 07/05/20 0437  07/06/20 0510  HGB 11.2*  10.1* 10.9*  HCT 34.7* 31.6* 33.8*  WBC 4.6 3.5* 3.2*  PLT 77* 73* 87*   History of intermittent atrial fibrillation on Cardizem: Not on AC.   Bruise on the skin heparin discontinued per oncology.  Platelets are low , will monitor need to monitor closely  DVT prophylaxis: Place and maintain sequential compression device Start: 06/30/20 0901 Heparin stopped due to  extensive LE bruise 7/12 Code Status: DNR Family Communication: plan of care discussed with patient at bedside.husband was updated previously.  Status is: Inpatient Remains inpatient appropriate because: Patient remains deconditioned weak, hypoxic needing oxygen, following up on chest x-ray for possible repeat thoracentesis, awaiting for oncology clearance for discharge home.  She has refused a skilled nursing facility.    Dispo: The patient is from: Home               Anticipated d/c is to: home              Anticipated d/c date is: 1-2 days              Patient currently not medically stable.  Nutrition: Diet Order            Diet regular Room service appropriate? Yes; Fluid consistency: Thin  Diet effective now                Body mass index is 26.46 kg/m.  Consultants:see note  Procedures:see note Microbiology:see note  Medications: Scheduled Meds: . Chlorhexidine Gluconate Cloth  6 each Topical Daily  . citalopram  40 mg Oral Daily  . diltiazem  180 mg Oral QHS  . heparin lock flush  500 Units Intracatheter Q30 days  . levothyroxine  224 mcg Oral Q0600  . melatonin  3 mg Oral QHS  . pantoprazole  40 mg Oral Daily   Continuous Infusions: . sodium chloride 10 mL/hr at 06/29/20 0600    Antimicrobials: Anti-infectives (From admission, onward)   Start     Dose/Rate Route Frequency Ordered Stop   06/27/20 0830  cefTRIAXone (ROCEPHIN) 1 g in sodium chloride 0.9 % 100 mL IVPB  Status:  Discontinued        1 g 200 mL/hr over 30 Minutes Intravenous Every 24 hours 06/27/20 0733  06/30/20 1340   06/24/20 0200  piperacillin-tazobactam (ZOSYN) IVPB 3.375 g  Status:  Discontinued        3.375 g 12.5 mL/hr over 240 Minutes Intravenous Every 8 hours 06/23/20 1703 06/27/20 0733   06/23/20 1645  piperacillin-tazobactam (ZOSYN) IVPB 3.375 g        3.375 g 100 mL/hr over 30 Minutes Intravenous  Once 06/23/20 1638 06/23/20 1834   06/23/20 1615  cefTRIAXone (ROCEPHIN) 1 g in sodium chloride 0.9 % 100 mL IVPB  Status:  Discontinued        1 g 200 mL/hr over 30 Minutes Intravenous  Once 06/23/20 1607 06/23/20 1608       Objective: Vitals: Today's Vitals   07/06/20 1340 07/06/20 1500 07/06/20 1555 07/06/20 1600  BP: (!) 155/60     Pulse: 83 81 79 80  Resp: 18     Temp: (!) 97.4 F (36.3 C)     TempSrc: Oral     SpO2: (!) 87% 94% 92% 95%  Weight:      Height:      PainSc:       No intake or output data in the 24 hours ending 07/06/20 1623 Filed Weights   06/23/20 1206  Weight: 78.9  kg   Weight change:    Intake/Output from previous day: No intake/output data recorded. Intake/Output this shift: No intake/output data recorded.  Examination: General exam: AAOx3, weak, frail, deconditionied,NAD, weak appearing. HEENT:Oral mucosa moist, Ear/Nose WNL grossly, dentition normal. Respiratory system: bilaterally diminished breath sounds at the base, no wheezing or crackles, Cardiovascular system: S1 & S2 +, No JVD,. Gastrointestinal system: Abdomen soft, NT,ND, BS+ Nervous System:Alert, awake, moving extremities and grossly nonfocal Extremities: No edema, distal peripheral pulses palpable.  Skin: No rashes,no icterus. MSK: Normal muscle bulk,tone, power  Data Reviewed: I have personally reviewed following labs and imaging studies CBC: Recent Labs  Lab 07/02/20 0409 07/03/20 0325 07/04/20 0345 07/05/20 0437 07/06/20 0510  WBC 4.5 4.8 4.6 3.5* 3.2*  HGB 11.6* 10.6* 11.2* 10.1* 10.9*  HCT 34.1* 33.6* 34.7* 31.6* 33.8*  MCV 103.3* 107.3* 107.4* 107.5*  107.6*  PLT 77* 67* 77* 73* 87*   Basic Metabolic Panel: Recent Labs  Lab 07/02/20 0409 07/03/20 0325 07/04/20 0345 07/05/20 0437 07/06/20 0510  NA 136 136 137 135 140  K 3.7 3.3* 3.9 3.5 3.7  CL 97* 99 103 102 106  CO2 26 28 27 27 28   GLUCOSE 109* 92 101* 96 114*  BUN 22 25* 21 14 9   CREATININE 1.03* 0.92 0.74 0.57 0.53  CALCIUM 7.6* 7.0* 7.0* 6.8* 7.0*   GFR: Estimated Creatinine Clearance: 58.8 mL/min (by C-G formula based on SCr of 0.53 mg/dL). Liver Function Tests: Recent Labs  Lab 07/02/20 0409 07/03/20 0325 07/04/20 0345 07/05/20 0437 07/06/20 0510  AST 331* 192* 130* 81* 64*  ALT 115* 77* 59* 41 35  ALKPHOS 137* 160* 175* 164* 166*  BILITOT 0.8 0.8 0.8 0.9 0.9  PROT 5.4* 5.0* 5.4* 4.7* 5.0*  ALBUMIN 3.0* 2.6* 2.8* 2.4* 2.5*   No results for input(s): LIPASE, AMYLASE in the last 168 hours. Recent Labs  Lab 07/02/20 1623  AMMONIA 26   Coagulation Profile: No results for input(s): INR, PROTIME in the last 168 hours. Cardiac Enzymes: No results for input(s): CKTOTAL, CKMB, CKMBINDEX, TROPONINI in the last 168 hours. BNP (last 3 results) No results for input(s): PROBNP in the last 8760 hours. HbA1C: No results for input(s): HGBA1C in the last 72 hours. CBG: No results for input(s): GLUCAP in the last 168 hours. Lipid Profile: No results for input(s): CHOL, HDL, LDLCALC, TRIG, CHOLHDL, LDLDIRECT in the last 72 hours. Thyroid Function Tests: No results for input(s): TSH, T4TOTAL, FREET4, T3FREE, THYROIDAB in the last 72 hours. Anemia Panel: No results for input(s): VITAMINB12, FOLATE, FERRITIN, TIBC, IRON, RETICCTPCT in the last 72 hours. Sepsis Labs: No results for input(s): PROCALCITON, LATICACIDVEN in the last 168 hours.  Recent Results (from the past 240 hour(s))  Body fluid culture     Status: None (Preliminary result)   Collection Time: 07/04/20 11:12 AM   Specimen: Lung, Right; Pleural Fluid  Result Value Ref Range Status   Specimen  Description   Final    PLEURAL Performed at [facility] [address], [place], [place] [address]    Special Requests   Final    NONE Performed at [facility], [place] [address] [place], [place] [address]    Gram Stain   Final    FEW WBC PRESENT, PREDOMINANTLY MONONUCLEAR NO ORGANISMS SEEN    Culture   Final    NO GROWTH 2 DAYS Performed at [facility] [address] [place], [place] [address]    Report Status  PENDING  Incomplete      Radiology Studies: No results found.   LOS: 12 days   Antonieta Pert, MD [facility]  07/06/2020, 4:23 PM

## 2020-07-07 ENCOUNTER — Inpatient Hospital Stay (HOSPITAL_COMMUNITY): Payer: Medicare PPO

## 2020-07-07 ENCOUNTER — Encounter (HOSPITAL_COMMUNITY): Payer: Self-pay | Admitting: Internal Medicine

## 2020-07-07 DIAGNOSIS — R000 Tachycardia, unspecified: Secondary | ICD-10-CM | POA: Diagnosis not present

## 2020-07-07 DIAGNOSIS — R112 Nausea with vomiting, unspecified: Secondary | ICD-10-CM

## 2020-07-07 DIAGNOSIS — I361 Nonrheumatic tricuspid (valve) insufficiency: Secondary | ICD-10-CM

## 2020-07-07 LAB — BODY FLUID CULTURE: Culture: NO GROWTH

## 2020-07-07 LAB — CBC
HCT: 36 % (ref 36.0–46.0)
Hemoglobin: 11.5 g/dL — ABNORMAL LOW (ref 12.0–15.0)
MCH: 34.2 pg — ABNORMAL HIGH (ref 26.0–34.0)
MCHC: 31.9 g/dL (ref 30.0–36.0)
MCV: 107.1 fL — ABNORMAL HIGH (ref 80.0–100.0)
Platelets: 111 10*3/uL — ABNORMAL LOW (ref 150–400)
RBC: 3.36 MIL/uL — ABNORMAL LOW (ref 3.87–5.11)
RDW: 15 % (ref 11.5–15.5)
WBC: 4.2 10*3/uL (ref 4.0–10.5)
nRBC: 0 % (ref 0.0–0.2)

## 2020-07-07 LAB — BRAIN NATRIURETIC PEPTIDE: B Natriuretic Peptide: 400.4 pg/mL — ABNORMAL HIGH (ref 0.0–100.0)

## 2020-07-07 LAB — COMPREHENSIVE METABOLIC PANEL
ALT: 31 U/L (ref 0–44)
AST: 52 U/L — ABNORMAL HIGH (ref 15–41)
Albumin: 2.6 g/dL — ABNORMAL LOW (ref 3.5–5.0)
Alkaline Phosphatase: 161 U/L — ABNORMAL HIGH (ref 38–126)
Anion gap: 10 (ref 5–15)
BUN: 9 mg/dL (ref 8–23)
CO2: 26 mmol/L (ref 22–32)
Calcium: 7.3 mg/dL — ABNORMAL LOW (ref 8.9–10.3)
Chloride: 105 mmol/L (ref 98–111)
Creatinine, Ser: 0.5 mg/dL (ref 0.44–1.00)
GFR calc Af Amer: >60 mL/min (ref >60)
GFR calc non Af Amer: >60 mL/min (ref >60)
Glucose, Bld: 107 mg/dL — ABNORMAL HIGH (ref 70–99)
Potassium: 3.8 mmol/L (ref 3.5–5.1)
Sodium: 141 mmol/L (ref 135–145)
Total Bilirubin: 0.8 mg/dL (ref 0.3–1.2)
Total Protein: 5.1 g/dL — ABNORMAL LOW (ref 6.5–8.1)

## 2020-07-07 LAB — ECHOCARDIOGRAM COMPLETE
Area-P 1/2: 4.96 cm2
Height: 68 in
S' Lateral: 3.3 cm
Weight: 2784 oz

## 2020-07-07 LAB — PROCALCITONIN: Procalcitonin: <0.1 ng/mL

## 2020-07-07 LAB — CYTOLOGY - NON PAP

## 2020-07-07 MED ORDER — IOHEXOL 350 MG/ML SOLN
100.0000 mL | Freq: Once | INTRAVENOUS | Status: AC | PRN
  Administered 2020-07-07: 100 mL via INTRAVENOUS

## 2020-07-07 MED ORDER — DILTIAZEM HCL 25 MG/5ML IV SOLN
10.0000 mg | Freq: Once | INTRAVENOUS | Status: DC
  Filled 2020-07-07: qty 5

## 2020-07-07 MED ORDER — SODIUM CHLORIDE (PF) 0.9 % IJ SOLN
INTRAMUSCULAR | Status: AC
  Filled 2020-07-07: qty 50

## 2020-07-07 NOTE — Progress Notes (Signed)
Patient tx to 1434 via bed from 3rd Floor from report for tachycardia and uncontrolled AFIB. Please see flowsheet for VS. Bedside monitor applied and confirmed with CCMD. Patient voices that she is anxious. Dr. Lupita Leash notified and was at bedside upon arrival. Will admin xanax for anxiety from [facility].

## 2020-07-07 NOTE — Consult Note (Addendum)
Cardiology Consultation:   Patient ID: Tracy Mclaughlin MRN: [id number]; DOB: [birth year]  Admit date: 06/23/2020 Date of Consult: 07/07/2020  Primary Care Provider: [name], PA [facility] Cardiologist: Sinclair Grooms, MD  [facility] Electrophysiologist:  None    Patient Profile:   Tracy Mclaughlin is a 86 y.o. female with a hx of paroxysmal atrial fibrillation (prior h/o life threatening bleeding on anticoagulation), stage IV papillary thyroid carcinoma s/p thyroidectomy, post-surgical hypothyroidism, colon CA s/p resection, non-Hodgkin's lymphoma, pancytopenia, HTN, anemia, arthritis, [place], iron deficiency anemia who is being seen today for the evaluation of tachycardia at the request of Dr. Lupita Leash  History of Present Illness:   She has followed with Dr. Tamala Julian with the above history. She carries remote history of atrial fibrillation but prior severe GI bleeding while on Xarelto in [place]. [name], therefore she has been managed with aspirin only. Dr. Tamala Julian also outlines h/o rare recurrent palpitations felt to represent PAT. She has not required event monitoring. Last echo 05/2019 showed EF 55-60%, impaired relaxation. She has been following with oncology for her lymphoma. This was originally treated with rituxan. She had extensive recurrent disease in 02/2019 so has been treated with regimen that includes bendamustine, Rituxan, CHOP, Revlimid, Adriamycin, and most recently Acalabrutinib. She has been in the hospital for 13 days thus far. She was admitted 06/23/2020 with 3-4 days of nausea, vomiting, diarrhea and abdominal pain that started after her newest chemotherapy agent. She has been found to have new ascites on CT scan (too small to tap), pleural effusion s/p right thoracentesis (blood-tinged fluid, cytology pending), acute hypoxic respiratory failure, abnormal LFTs, hypokalemia and acute metabolic encephalopathy with waxing/waning confusion. RUQ [place] with possible cirrhotic  changes. Most recent labs show normal renal function, mildly elevated AST, albumin of 2.6, calcium of 7.3, Hgb 11.5 (macrocytic). Cancer team has recommended CTA to r/o PE and re-evaluate effusion. The last few days outline difficulty with respiratory distress.  This morning she was noted to go into a narrow complex regular tachycardia with HR in the 130s - does not appear to be atrial fib, question atrial tach vs atrial flutter (appears less typical for the latter). She had 10mg  of diltiazem ordered but appears to have spontaneously converted back to NSR shortly after 10am. There was a brief period of irregularity while converting but not long enough to strongly suggest atrial fib. Currently the patient is confused, denies any acute symptoms, said "she feels about the same but you might want to ask [place] because she may feel differently."   Past Medical History:  Diagnosis Date  . Anemia   . Arthritis   . Cancer ([place])    Thyroid/Colon/ B cell Lymphoma-remission  . Colon cancer ([place])   . Diverticulosis   . Hypertension   . Lymphoma ([place])   . Non Hodgkin's lymphoma ([place])    Non-Hodgkin's B-Cell Lymphoma  . Pancytopenia ([place])   . Papillary thyroid carcinoma ([place])   . Paroxysmal atrial fibrillation ([facility])   . Post-surgical hypothyroidism   . Pulmonary embolus ([place]) 11/2011  . Subarachnoid hemorrhage ([place]) 1996  . Thyroid cancer [facility])     Past Surgical History:  Procedure Laterality Date  . APPENDECTOMY  1959  . CHOLECYSTECTOMY  1999   S/P Lap Cholecystectomy  . COLON RESECTION  11/12   mucinous adenocarcinoma of the cecum, resected  . COLONOSCOPY  10/05/2011  . COLONOSCOPY N/A 04/17/2014   Procedure: COLONOSCOPY;  Surgeon: Gatha Mayer, MD;  Location: [facility];  Service: Endoscopy;  Laterality: N/A;  . ESOPHAGOGASTRODUODENOSCOPY N/A 04/17/2014   Procedure: ESOPHAGOGASTRODUODENOSCOPY (EGD);  Surgeon: Gatha Mayer, MD;  Location: [facility];  Service: Endoscopy;  Laterality:  N/A;  . KNEE ARTHROSCOPY Left 1998   S/P  Arthroscopic Left knee surgery  . LYMPH NODE DISSECTION Left 05/04/2019   Procedure: EXCISION DEEP LEFT AXILLARY LYMPH NODES;  Surgeon: Fanny Skates, MD;  Location: [facility];  Service: General;  Laterality: Left;  . PORTACATH PLACEMENT N/A 05/15/2019   Procedure: INSERTION PORT-A-CATH WITH ULTRASOUND;  Surgeon: Fanny Skates, MD;  Location: [facility];  Service: General;  Laterality: N/A;  . ROTATOR CUFF REPAIR Right 1998  . SUBDURAL HEMATOMA EVACUATION VIA CRANIOTOMY    . THYROIDECTOMY  05/23/2012   Procedure: THYROIDECTOMY;  Surgeon: [name], MD;  Location: [place];  Service: General;  Laterality: N/A;  Total thyroidectomy, central compartment lymph node biopsy times two, reimplantation left superior parathyroid gland.  . TUBOPLASTY / TUBOTUBAL ANASTOMOSIS       Home Medications:  Prior to Admission medications   Medication Sig Start Date End Date Taking? Authorizing Provider  acalabrutinib (CALQUENCE) 100 MG capsule Take 1 capsule (100 mg total) by mouth daily. Patient taking differently: Take 100 mg by mouth at bedtime.  06/17/20  Yes [name], MD  citalopram (CELEXA) 40 MG tablet Take 40 mg by mouth daily.   Yes [provider]  diltiazem (DILACOR XR) 180 MG 24 hr capsule Take 1 capsule (180 mg total) by mouth daily. Patient taking differently: Take 180 mg by mouth at bedtime.  12/24/14  Yes Belva Crome, MD  diphenhydrAMINE (BENADRYL) 25 MG tablet Take 25 mg by mouth at bedtime as needed. For itching/sleep   Yes [provider]  levothyroxine (SYNTHROID, LEVOTHROID) 112 MCG tablet Take 224 mcg by mouth daily before breakfast. Pt takes two 112 mcg tablets to equal 224 mcg once a day   Yes [provider]  ondansetron (ZOFRAN) 8 MG tablet Take 1 tablet (8 mg total) by mouth every 8 (eight) hours as needed for nausea or vomiting. Patient not taking: Reported on 04/02/2020 05/10/19   Owens Shark, NP     Inpatient Medications: Scheduled Meds: . azithromycin  500 mg Oral Daily  . Chlorhexidine Gluconate Cloth  6 each Topical Daily  . citalopram  40 mg Oral Daily  . diltiazem  180 mg Oral QHS  . diltiazem  10 mg Intravenous Once  . heparin lock flush  500 Units Intracatheter Q30 days  . levothyroxine  224 mcg Oral Q0600  . melatonin  3 mg Oral QHS  . pantoprazole  40 mg Oral Daily   Continuous Infusions: . sodium chloride 10 mL/hr at 06/29/20 0600  . cefTRIAXone (ROCEPHIN)  IV 1 g (07/06/20 2001)   PRN Meds: sodium chloride, acetaminophen **OR** acetaminophen, albuterol, ALPRAZolam, alum & mag hydroxide-simeth, diphenhydrAMINE, heparin lock flush **AND** heparin lock flush, loperamide, ondansetron **OR** ondansetron (ZOFRAN) IV, sodium chloride flush  Allergies:    Allergies  Allergen Reactions  . Demerol [Meperidine Hcl] Nausea And Vomiting    Social History:   Social History   Socioeconomic History  . Marital status: Married    Spouse name: Not on file  . Number of children: 0  . Years of education: Not on file  . Highest education level: Not on file  Occupational History  . Occupation: retired  Tobacco Use  . Smoking status: Former Smoker    Packs/day: 1.00  Years: 12.00    Pack years: 12.00    Types: Cigarettes    Quit date: 08/23/1969    Years since quitting: 50.9  . Smokeless tobacco: Never Used  Vaping Use  . Vaping Use: Never used  Substance and Sexual Activity  . Alcohol use: Not Currently    Comment: 1 manhattens per night- Last had 3 months ago  . Drug use: No  . Sexual activity: Not on file  Other Topics Concern  . Not on file  Social History Narrative  . Not on file   Social Determinants of Health   Financial Resource Strain:   . Difficulty of Paying Living Expenses:   Food Insecurity:   . Worried About Charity fundraiser in the Last Year:   . Arboriculturist in the Last Year:   Transportation Needs:   . Film/video editor  (Medical):   Marland Kitchen Lack of Transportation (Non-Medical):   Physical Activity:   . Days of Exercise per Week:   . Minutes of Exercise per Session:   Stress:   . Feeling of Stress :   Social Connections:   . Frequency of Communication with Friends and Family:   . Frequency of Social Gatherings with Friends and Family:   . Attends Religious Services:   . Active Member of Clubs or Organizations:   . Attends Archivist Meetings:   Marland Kitchen Marital Status:   Intimate Partner Violence:   . Fear of Current or Ex-Partner:   . Emotionally Abused:   Marland Kitchen Physically Abused:   . Sexually Abused:     Family History:    Family History  Problem Relation Age of Onset  . Kidney cancer Sister        ?  Marland Kitchen Hypertension Sister   . Lung cancer Brother   . Hypertension Mother   . Diabetes Mother   . Coronary artery disease Mother   . Diabetes Sister        x 2  . Stroke Sister   . Anesthesia problems Neg Hx   . Heart attack Neg Hx      ROS:  Please see the HPI -otherwise unable to reliably obtain due to mental status   Physical Exam/Data:   Vitals:   07/07/20 1006 07/07/20 1007 07/07/20 1008 07/07/20 1022  BP: 124/71     Pulse: (!) 106 (!) 138 80 85  Resp:    (!) 22  Temp: 98 F (36.7 C)     TempSrc: Oral     SpO2: 99%   94%  Weight:      Height:        Intake/Output Summary (Last 24 hours) at 07/07/2020 1135 Last data filed at 07/07/2020 0900 Gross per 24 hour  Intake 1107.5 ml  Output 0 ml  Net 1107.5 ml   Last 3 Weights 06/23/2020 06/13/2020 05/28/2020  Weight (lbs) 174 lb 177 lb 14.4 oz 175 lb 9.6 oz  Weight (kg) 78.926 kg 80.695 kg 79.652 kg     Body mass index is 26.46 kg/m.  General: Elderly chronically ill appearing WF in no acute distress Head: Normocephalic, atraumatic, sclera non-icteric, no xanthomas, nares are without discharge. Neck: Negative for carotid bruits. JVP not elevated. Lungs: Coarse diffusely with decreased BS over right base, no acute wheezing. Breathing  is unlabored. Heart: RRR S1 S2 without murmurs, rubs, or gallops.  Abdomen: Soft, non-tender, non-distended with normoactive bowel sounds. No rebound/guarding. Extremities: No clubbing or cyanosis. No edema. Distal pedal  pulses are 2+ and equal bilaterally. Neuro: Alert and oriented to self, [facility], Does not know date, references self in 3rd person. Follows commands and answers symptom questions with vague answers Psych:  Responds to questions appropriately with a reserved affect.  EKG:  The EKG was personally reviewed and demonstrates:  Narrow complex tachycardia 136bpm with nonspecific STT changes  Telemetry:  Telemetry was personally reviewed and demonstrates: outlined above  Relevant CV Studies: 2D Echo 05/2019   1. The left ventricle has normal systolic function, with an ejection  fraction of 55-60%. The cavity size was normal. Left ventricular diastolic  Doppler parameters are consistent with impaired relaxation. No evidence of  left ventricular regional wall  motion abnormalities.  2. The right ventricle has normal systolic function. The cavity was  normal. There is no increase in right ventricular wall thickness.  3. The aortic valve is tricuspid.  4. The average left ventricular global longitudinal strain is -21.6 %.   Laboratory Data:  High Sensitivity Troponin:  No results for input(s): TROPONINIHS in the last 720 hours.   Chemistry Recent Labs  Lab 07/05/20 0437 07/06/20 0510 07/07/20 0544  NA 135 140 141  K 3.5 3.7 3.8  CL 102 106 105  CO2 27 28 26   GLUCOSE 96 114* 107*  BUN 14 9 9   CREATININE 0.57 0.53 0.50  CALCIUM 6.8* 7.0* 7.3*  GFRNONAA >60 >60 >60  GFRAA >60 >60 >60  ANIONGAP 6 6 10     Recent Labs  Lab 07/05/20 0437 07/06/20 0510 07/07/20 0544  PROT 4.7* 5.0* 5.1*  ALBUMIN 2.4* 2.5* 2.6*  AST 81* 64* 52*  ALT 41 35 31  ALKPHOS 164* 166* 161*  BILITOT 0.9 0.9 0.8   Hematology Recent Labs  Lab 07/05/20 0437 07/06/20 0510  07/07/20 0544  WBC 3.5* 3.2* 4.2  RBC 2.94* 3.14* 3.36*  HGB 10.1* 10.9* 11.5*  HCT 31.6* 33.8* 36.0  MCV 107.5* 107.6* 107.1*  MCH 34.4* 34.7* 34.2*  MCHC 32.0 32.2 31.9  RDW 15.1 15.1 15.0  PLT 73* 87* 111*   BNPNo results for input(s): BNP, PROBNP in the last 168 hours.  DDimer No results for input(s): DDIMER in the last 168 hours.   Radiology/Studies:  DG Chest 1 View  Result Date: 07/04/2020 CLINICAL DATA:  Status post right thoracentesis. EXAM: CHEST  1 VIEW COMPARISON:  Chest x-ray from yesterday. FINDINGS: Unchanged right chest wall port catheter. The heart size and mediastinal contours are within normal limits. Normal pulmonary vascularity. Trace residual right pleural effusion status post thoracentesis. Improved aeration at the right lung base. No pneumothorax. Unchanged small left pleural effusion and basilar atelectasis. No acute osseous abnormality. IMPRESSION: 1. Trace residual right pleural effusion status post thoracentesis. No pneumothorax. 2. Unchanged small left pleural effusion and basilar atelectasis. Electronically Signed   By: Titus Dubin M.D.   On: 07/04/2020 11:42   DG Chest 2 View  Result Date: 07/06/2020 CLINICAL DATA:  Worsening shortness of breath. EXAM: CHEST - 2 VIEW COMPARISON:  07/04/2020 FINDINGS: The cardiac silhouette remains grossly normal in size, spur partially obscured by significantly increased patchy opacity in the right lower lung zone. Small amount of posterior pleural fluid. Mild linear density at the left lung base. Right subclavian porta catheter tip in the region of the superior cavoatrial junction. Unremarkable bones. IMPRESSION: 1. Significantly increased right lower lung zone pneumonia. 2. Small amount of posterior pleural fluid. 3. Mild left basilar atelectasis. Electronically Signed   By: Claudie Revering  M.D.   On: 07/06/2020 16:24   DG Chest 2 View  Result Date: 07/03/2020 CLINICAL DATA:  Dyspnea, pleural effusions EXAM: CHEST - 2 VIEW  COMPARISON:  06/26/2020 FINDINGS: Frontal and lateral views of the chest demonstrates stable right chest wall port. Cardiac silhouette is unremarkable. There is now a moderate right pleural effusion with right basilar consolidation. Decreased left pleural effusion, with only trace fluid at the left costophrenic angle. Persistent retrocardiac consolidation. No pneumothorax. No acute bony abnormalities. IMPRESSION: 1. Decreased left pleural effusion, with persistent left basilar consolidation. 2. Increasing right pleural effusion, with increasing right basilar consolidation. Electronically Signed   By: Randa Ngo M.D.   On: 07/03/2020 16:11   US THORACENTESIS ASP PLEURAL SPACE W/IMG GUIDE  Result Date: 07/04/2020 INDICATION: Patient with history of lymphoma, remote colon and thyroid cancers, dyspnea, right pleural effusion. Request made for diagnostic and therapeutic right thoracentesis. EXAM: ULTRASOUND GUIDED DIAGNOSTIC AND THERAPEUTIC RIGHT THORACENTESIS MEDICATIONS: 1% lidocaine to skin and subcutaneous tissue COMPLICATIONS: None immediate. PROCEDURE: An ultrasound guided thoracentesis was thoroughly discussed with the patient and questions answered. The benefits, risks, alternatives and complications were also discussed. The patient understands and wishes to proceed with the procedure. Written consent was obtained. Ultrasound was performed to localize and mark an adequate pocket of fluid in the right chest. The area was then prepped and draped in the normal sterile fashion. 1% Lidocaine was used for local anesthesia. Under ultrasound guidance a 6 Fr Safe-T-Centesis catheter was introduced. Thoracentesis was performed. The catheter was removed and a dressing applied. FINDINGS: A total of approximately 1.6 liters of blood-tinged fluid was removed. Samples were sent to the laboratory as requested by the clinical team. IMPRESSION: Successful ultrasound guided diagnostic and therapeutic right thoracentesis  yielding 1.6 liters of pleural fluid. Read by: Rowe Robert, PA-C Electronically Signed   By: Lucrezia Europe M.D.   On: 07/04/2020 11:16    Assessment and Plan:   1. Multiple medical issues which include nausea, vomiting, diarrhea following new initiation of chemotherapy with oncologic history as above, with ascites, pleural effusion s/p thoracentesis (cytology pending), acute hypoxic respiratory failure, abnormal LFTs, and acute metabolic encephalopathy - ongoing care by heme-onc and IM - CT angio to r/o PE planned  2. Narrow complex tachycardia - tracing reveals a regular tachycardia with rates in the 130s. She was placed on telemetry around time of event, which also demonstrates an abrupt conversion to NSR with HR in the 80s, therefore unlikely to represent sinus tach. Does not carry features of atrial fibrillation so suspect atrial tachycardia vs atrial flutter. Will review EKGs with MD - spontaneously converted back to NSR without intervention - medical workup as above - continue diltiazem CD 180mg  nightly, pending d/w Dr. Margaretann Loveless - has BP room to titrate if needed - will obtain updated 2D echocardiogram given chemotherapy hx - add thyroid in AM  3. History of PAF  - not on anticoagulation prior to admission per Dr. Tamala Julian due to h/o life threatening bleeding while on Xarelto  4. Essential HTN - stable  For questions or updates, please contact [facility] Please consult [email or web address] for contact info under    Signed, Charlie Pitter, PA-C  07/07/2020 11:35 AM  Patient seen and examined with Melina Copa PA-C.  Agree as above, with the following exceptions and changes as noted below. She is sleeping and somewhat challenging to arouse for exam but moves all extremities. Gen: NAD, CV: RRR, no murmurs, Lungs: clear, Abd: soft, Extrem: Warm, well  perfused, no edema, Neuro/Psych: sleeping, responds to sounds and moves all extremities. All available labs, radiology testing, previous records  reviewed. She is currently in sinus rhythm and continues on home diltiazem 180 mg daily. IF recurrence of rapid rate, consider adding beta blocker to regimen.   Elouise Munroe, MD 07/07/20 4:17 PM

## 2020-07-07 NOTE — Progress Notes (Signed)
  Echocardiogram 2D Echocardiogram has been performed.  Darlina Sicilian M 07/07/2020, 1:15 PM

## 2020-07-07 NOTE — Progress Notes (Signed)
PT Cancellation Note  Patient Details Name: Tracy Mclaughlin MRN: [id number] DOB: Dec 05, 1934   Cancelled Treatment:    Reason Eval/Treat Not Completed: Medical issues which prohibited therapy (tachy/afib). Transferred to tele this am   [facility] 07/07/2020, 11:29 AM

## 2020-07-07 NOTE — Progress Notes (Signed)
PROGRESS NOTE    Tracy Mclaughlin  WUX:[id number] DOB: 1934/04/15 DOA: 06/23/2020 PCP: [name], PA   Chef Complaints:  nausea  Brief Narrative: 86 y.o.femalewith medical history significant forlow-grade lymphoma without significant response to chemotherapy last year and was recently started onBTK INHIBITORby Dr. [name], hypothyroidism, history of A. fib, pancytopenia presents to the ED with3-4 day history of nausea vomiting and diarrhea and abdominal pain-and right upper quadrant.symptomts started after new chemo on Thursday.Patient denies any fever chest pain.vomitted x3 today and had non bloody diarrhea 4 times today.  ED Course:Hemodynamically stable, afebrile, UA positive for UTI,CT scan abdomen done that showed free fluid with ascites and mesenteric edema, case was discussed Dr. [name] from oncology advised UTI treatment with Zosyn and he will see the patient. Patient  admitted, being managed symptomatically chemo was held diarrhea improved subsequently oncology put the patient back on chemo overall tolerating well. Having nausea anorexia suspecting this is related to her lymphoma. Patient has been started on prednisone to help with appetite. Patient remains deconditioned with at times nauseous, poor appetite, intermittently confused. She has been short of breath dyspnea with activity  Subjective:  HR in 136, no chest pain c/o shortness of breath no nausea vomiting and cotinues to feel weak, frail, deconditioned. On 2l [place] this am.  Wants something to sleep tonight-did not sleep well past 2 nights. She was given trazodone for sleep but became confused and v sleepy next morning. Being moved to telemetry.  Assessment & Plan:  Intractable nausea vomitingdiarrhea andabdominal pain: Unclear etiology suspecting due to lymphoma.  Chemo- Acalabrutinib was held on admission and resumed 7/8.  Diarrhea improved. Placed on prednisone to help with low appetite.chemo is again  on hold due to worsening LFTs. She has new onset ascites on the CT scan-but too small to tap on the ultrasound unable to do paracentesis.at this time no reported nausea and vomiting.  Continue diet as tolerated.  Continue Protonix.  Pleural effusion x-ray shows worsening right pleural effusion also with some left pleural effusion.  ? etiology ? due to lymphoma.  Status post right thoracentesis 1.6 L blood-tinged fluid was removed.  Pleural fluid Gram stain no organism, culture no growth so far, cytology pending.  Repeat chest x-ray abnormal, due to ongoing hypoxia tachycardia getting CTA chest to evaluate the pleural effusion further and rule out PE.  CTA shows moderate right and mild to moderate left pleural effusions-follow-up on echocardiogram check BNP may need repeat thoracentesis will discuss with hematology, and await for cardiology input to see if this is from CHF.  Acute hypoxic respiratory failure due to pleural effusion/? pneumonia. Repeat chest x-ray shows increased right lower lung zone opacity-? Possible pneumonia- checkED procalcitonin which has been normal x2 and afebrile, WBC count 4.2k stable.  Less suspicion of pneumonia, await for CT chest.  CT chest report came back and reviewed, has right basilar atelectasis/consolidation and left basilar atelectasis.  Keep on empiric antibiotics for now.   Narrow complex tachycardia this morning heart rate in 130s.  Patient was moved to telemetry following which she had spontaneous conversant to sinus rhythm in the 80s.  Cardiology was consulted and notified.  Cardizem was held as she had episode of bradycardia 30s to 50s while on the way to telemetry.  Check TSH/bnp ., follow-up echocardiogram, follow-up cardiology recs. patient is on Cardizem qhs for history of intermittent atrial fibrillation  Ascites:new onset ascites on the CT scan-too small to tap on paracentesis per IR.  Abnormal LFTS: Likely due to chemo and chemo has been held LFT is  starting to downtrend. RUQ [place]- possible cirrhotic changes, hepatitis panel negative.   Acute metabolic encephalopathy from polypharmacy: had  confusion/Lethargy: 7/14 a.m likley polypharmacy and pt feels improved after stopping trazodone/prednisone.  She is again requesting for sleeping medication, will need to caution on that.  She is on Xanax low-dose continue for anxiety.  Remains deconditioned weak and frail but refusing SNF placement.    Hypokalemia.: resolved.   Recent Labs  Lab 07/03/20 0325 07/04/20 0345 07/05/20 0437 07/06/20 0510 07/07/20 0544  K 3.3* 3.9 3.5 3.7 3.8   E coli UTI IEP:PIRJJOACZ 7 days of antibiotics.   Non Hodgkin's lymphoma-with progressive decline, patientwasstartedpast weekonAcalabrutinib she did not have significant response to chemotherapy last year.  Acalabrutinib was held on admission and resumed  But again stopped due to worsening LFTs by oncology.  Failure to thrive/deconditioning:Remains weak and deconditioned. She refused SNF.    Hypertension: BP stable on Cardizem.    Pancytopenia with anemia/Thrombocytopenia in the setting of lymphoma/chemo: Platelet count is up today 87k>>111kg.  Monitor closely.Heparin was discontinued by hematology due to bilateral lower leg bruise and thrombocytopenia.   Recent Labs  Lab 07/05/20 0437 07/06/20 0510 07/07/20 0544  HGB 10.1* 10.9* 11.5*  HCT 31.6* 33.8* 36.0  WBC 3.5* 3.2* 4.2  PLT 73* 87* 111*   History of intermittent atrial fibrillation on Cardizem: Not on anticoagulation due to history of subarachnoid hemorrhage cardiology note.  Bruise on the skin heparin discontinued per oncology.  Platelets are low , will monitor need to monitor closely  DVT prophylaxis: Place and maintain sequential compression device Start: 06/30/20 0901 Heparin stopped due to  extensive LE bruise 7/12 and low platelet counts by hematology Code Status: DNR Family Communication: plan of care discussed with patient at  bedside.husband was updated previously.  I spoke with husband this morning updated and explained her declining situation and that she is being monitored to telemetry.  He is anxious and apprehensive that when she returns home he will not be able to have much support at home.   Status is: Inpatient Remains inpatient appropriate because: Patient remains deconditioned weak, hypoxic this morning with tachycardia being moved to telemetry.  Dispo: The patient is from: Home               Anticipated d/c is to: home with home health.  Refusing skilled nursing facility.  Husband is worried that he won't be able to take care of her.              Anticipated d/c date is: 2 days              Patient currently not medically stable.  Nutrition: Diet Order            Diet regular Room service appropriate? Yes; Fluid consistency: Thin  Diet effective now                Body mass index is 26.46 kg/m.  Consultants:see note  Procedures:see note Microbiology:see note  Medications: Scheduled Meds: . azithromycin  500 mg Oral Daily  . Chlorhexidine Gluconate Cloth  6 each Topical Daily  . citalopram  40 mg Oral Daily  . diltiazem  180 mg Oral QHS  . heparin lock flush  500 Units Intracatheter Q30 days  . levothyroxine  224 mcg Oral Q0600  . melatonin  3 mg Oral QHS  . pantoprazole  40 mg Oral Daily  Continuous Infusions: . sodium chloride 10 mL/hr at 06/29/20 0600  . cefTRIAXone (ROCEPHIN)  IV 1 g (07/06/20 2001)    Antimicrobials: Anti-infectives (From admission, onward)   Start     Dose/Rate Route Frequency Ordered Stop   07/06/20 1800  cefTRIAXone (ROCEPHIN) 1 g in sodium chloride 0.9 % 100 mL IVPB     Discontinue     1 g 200 mL/hr over 30 Minutes Intravenous Every 24 hours 07/06/20 1646 07/11/20 1759   07/06/20 1800  azithromycin (ZITHROMAX) tablet 500 mg     Discontinue     500 mg Oral Daily 07/06/20 1646 07/11/20 0959   06/27/20 0830  cefTRIAXone (ROCEPHIN) 1 g in sodium chloride 0.9  % 100 mL IVPB  Status:  Discontinued        1 g 200 mL/hr over 30 Minutes Intravenous Every 24 hours 06/27/20 0733 06/30/20 1340   06/24/20 0200  piperacillin-tazobactam (ZOSYN) IVPB 3.375 g  Status:  Discontinued        3.375 g 12.5 mL/hr over 240 Minutes Intravenous Every 8 hours 06/23/20 1703 06/27/20 0733   06/23/20 1645  piperacillin-tazobactam (ZOSYN) IVPB 3.375 g        3.375 g 100 mL/hr over 30 Minutes Intravenous  Once 06/23/20 1638 06/23/20 1834   06/23/20 1615  cefTRIAXone (ROCEPHIN) 1 g in sodium chloride 0.9 % 100 mL IVPB  Status:  Discontinued        1 g 200 mL/hr over 30 Minutes Intravenous  Once 06/23/20 1607 06/23/20 1608       Objective: Vitals: Today's Vitals   07/06/20 1600 07/06/20 1954 07/06/20 2056 07/07/20 0649  BP:   120/67 134/68  Pulse: 80  (!) 107 75  Resp:   17 16  Temp:   98.2 F (36.8 C) 98.3 F (36.8 C)  TempSrc:   Oral Oral  SpO2: 95%  91% (!) 89%  Weight:      Height:      PainSc:  0-No pain      Intake/Output Summary (Last 24 hours) at 07/07/2020 0801 Last data filed at 07/07/2020 0647 Gross per 24 hour  Intake 1227.5 ml  Output 0 ml  Net 1227.5 ml   Filed Weights   06/23/20 1206  Weight: 78.9 kg   Weight change:    Intake/Output from previous day: 07/18 0701 - 07/19 0700 In: 1227.5 [P.O.:540; I.V.:587.5; IV Piggyback:100] Out: 0  Intake/Output this shift: No intake/output data recorded.  Examination: General exam: AAOX3, WEAK, FRAIL, NAD, weak appearing. HEENT:Oral mucosa moist, Ear/Nose WNL grossly, dentition normal. Respiratory system: bilaterally air entry present, wheezing and decreased breath sounds on Rt lower base ,no use of accessory muscle Cardiovascular system: S1 & S2 +, No JVD,. Gastrointestinal system: Abdomen soft, NT,ND, BS+ Nervous System:Alert, awake, moving extremities and grossly nonfocal Extremities: No edema, distal peripheral pulses palpable.  Skin: No rashes,no icterus. MSK: Normal muscle bulk,tone,  power  Data Reviewed: I have personally reviewed following labs and imaging studies CBC: Recent Labs  Lab 07/03/20 0325 07/04/20 0345 07/05/20 0437 07/06/20 0510 07/07/20 0544  WBC 4.8 4.6 3.5* 3.2* 4.2  HGB 10.6* 11.2* 10.1* 10.9* 11.5*  HCT 33.6* 34.7* 31.6* 33.8* 36.0  MCV 107.3* 107.4* 107.5* 107.6* 107.1*  PLT 67* 77* 73* 87* 595*   Basic Metabolic Panel: Recent Labs  Lab 07/03/20 0325 07/04/20 0345 07/05/20 0437 07/06/20 0510 07/07/20 0544  NA 136 137 135 140 141  K 3.3* 3.9 3.5 3.7 3.8  CL 99 103 102 106  105  CO2 28 27 27 28 26   GLUCOSE 92 101* 96 114* 107*  BUN 25* 21 14 9 9   CREATININE 0.92 0.74 0.57 0.53 0.50  CALCIUM 7.0* 7.0* 6.8* 7.0* 7.3*   GFR: Estimated Creatinine Clearance: 58.8 mL/min (by C-G formula based on SCr of 0.5 mg/dL). Liver Function Tests: Recent Labs  Lab 07/03/20 0325 07/04/20 0345 07/05/20 0437 07/06/20 0510 07/07/20 0544  AST 192* 130* 81* 64* 52*  ALT 77* 59* 41 35 31  ALKPHOS 160* 175* 164* 166* 161*  BILITOT 0.8 0.8 0.9 0.9 0.8  PROT 5.0* 5.4* 4.7* 5.0* 5.1*  ALBUMIN 2.6* 2.8* 2.4* 2.5* 2.6*   No results for input(s): LIPASE, AMYLASE in the last 168 hours. Recent Labs  Lab 07/02/20 1623  AMMONIA 26   Coagulation Profile: No results for input(s): INR, PROTIME in the last 168 hours. Cardiac Enzymes: No results for input(s): CKTOTAL, CKMB, CKMBINDEX, TROPONINI in the last 168 hours. BNP (last 3 results) No results for input(s): PROBNP in the last 8760 hours. HbA1C: No results for input(s): HGBA1C in the last 72 hours. CBG: No results for input(s): GLUCAP in the last 168 hours. Lipid Profile: No results for input(s): CHOL, HDL, LDLCALC, TRIG, CHOLHDL, LDLDIRECT in the last 72 hours. Thyroid Function Tests: No results for input(s): TSH, T4TOTAL, FREET4, T3FREE, THYROIDAB in the last 72 hours. Anemia Panel: No results for input(s): VITAMINB12, FOLATE, FERRITIN, TIBC, IRON, RETICCTPCT in the last 72 hours. Sepsis  Labs: Recent Labs  Lab 07/06/20 1703 07/07/20 0544  PROCALCITON <0.10 <0.10    Recent Results (from the past 240 hour(s))  Body fluid culture     Status: None (Preliminary result)   Collection Time: 07/04/20 11:12 AM   Specimen: Lung, Right; Pleural Fluid  Result Value Ref Range Status   Specimen Description   Final    PLEURAL Performed at [facility], [place] [address], [place], [place] [address]    Special Requests   Final    NONE Performed at [facility], [place] [address] [place], [place] [address]    Gram Stain   Final    FEW WBC PRESENT, PREDOMINANTLY MONONUCLEAR NO ORGANISMS SEEN    Culture   Final    NO GROWTH 2 DAYS Performed at [facility] [address], [place], [place] [address]    Report Status PENDING  Incomplete      Radiology Studies: DG Chest 2 View  Result Date: 07/06/2020 CLINICAL DATA:  Worsening shortness of breath. EXAM: CHEST - 2 VIEW COMPARISON:  07/04/2020 FINDINGS: The cardiac silhouette remains grossly normal in size, spur partially obscured by significantly increased patchy opacity in the right lower lung zone. Small amount of posterior pleural fluid. Mild linear density at the left lung base. Right subclavian porta catheter tip in the region of the superior cavoatrial junction. Unremarkable bones. IMPRESSION: 1. Significantly increased right lower lung zone pneumonia. 2. Small amount of posterior pleural fluid. 3. Mild left basilar atelectasis. Electronically Signed   By: Claudie Revering M.D.   On: 07/06/2020 16:24     LOS: 24 days   Antonieta Pert, MD [facility]  07/07/2020, 8:01 AM

## 2020-07-07 NOTE — Progress Notes (Addendum)
HEMATOLOGY-ONCOLOGY PROGRESS NOTE  SUBJECTIVE: Developed tachycardia this morning with a heart rate into the 130s and up to 170s at times.  She is not having any chest pain or palpitations but reports shortness of breath.  Denies abdominal pain, nausea, vomiting this morning.  Oncology History  Non Hodgkin's lymphoma ([place])  09/29/2016 Initial Diagnosis   Non Hodgkin's lymphoma ([place])   03/22/2019 - 04/23/2019 Chemotherapy   The patient had palonosetron (ALOXI) injection 0.25 mg, 0.25 mg, Intravenous,  Once, 1 of 4 cycles Administration: 0.25 mg (03/22/2019) riTUXimab (RITUXAN) 800 mg in sodium chloride 0.9 % 250 mL (2.4242 mg/mL) infusion, 375 mg/m2 = 800 mg, Intravenous,  Once, 1 of 4 cycles Administration: 800 mg (03/22/2019) bendamustine (BENDEKA) 150 mg in sodium chloride 0.9 % 50 mL (2.6786 mg/mL) chemo infusion, 70 mg/m2 = 150 mg (100 % of original dose 70 mg/m2), Intravenous,  Once, 1 of 4 cycles Dose modification: 70 mg/m2 (original dose 70 mg/m2, Cycle 1, Reason: Provider Judgment) Administration: 150 mg (03/22/2019), 150 mg (03/23/2019)  for chemotherapy treatment.    05/18/2019 - 06/28/2019 Chemotherapy   The patient had DOXOrubicin (ADRIAMYCIN) chemo injection 82 mg, 40 mg/m2 = 82 mg (100 % of original dose 40 mg/m2), Intravenous,  Once, 2 of 6 cycles Dose modification: 40 mg/m2 (original dose 40 mg/m2, Cycle 1, Reason: Provider Judgment) Administration: 82 mg (05/18/2019), 82 mg (06/08/2019) palonosetron (ALOXI) injection 0.25 mg, 0.25 mg, Intravenous,  Once, 2 of 6 cycles Administration: 0.25 mg (05/18/2019), 0.25 mg (06/08/2019) pegfilgrastim (NEULASTA ONPRO KIT) injection 6 mg, 6 mg, Subcutaneous, Once, 2 of 6 cycles Administration: 6 mg (05/18/2019), 6 mg (06/08/2019) vinCRIStine (ONCOVIN) 1 mg in sodium chloride 0.9 % 50 mL chemo infusion, 1 mg (100 % of original dose 1 mg), Intravenous,  Once, 2 of 6 cycles Dose modification: 1 mg (original dose 1 mg, Cycle 1, Reason: Provider  Judgment) Administration: 1 mg (05/18/2019), 1 mg (06/08/2019) riTUXimab (RITUXAN) 800 mg in sodium chloride 0.9 % 250 mL (2.4242 mg/mL) infusion, 375 mg/m2 = 800 mg, Intravenous,  Once, 2 of 6 cycles Administration: 800 mg (05/18/2019), 800 mg (06/08/2019) cyclophosphamide (CYTOXAN) 1,240 mg in sodium chloride 0.9 % 250 mL chemo infusion, 600 mg/m2 = 1,540 mg, Intravenous,  Once, 2 of 6 cycles Dose modification: 600 mg/m2 (original dose 750 mg/m2, Cycle 2, Reason: Provider Judgment) Administration: 1,240 mg (05/18/2019), 1,240 mg (06/08/2019) fosaprepitant (EMEND) 150 mg, dexamethasone (DECADRON) 12 mg in sodium chloride 0.9 % 145 mL IVPB, , Intravenous,  Once, 2 of 6 cycles Administration:  (05/18/2019),  (06/08/2019)  for chemotherapy treatment.    07/19/2019 -  Chemotherapy   The patient had riTUXimab (RITUXAN) 700 mg in sodium chloride 0.9 % 250 mL (2.1875 mg/mL) infusion, 375 mg/m2 = 700 mg, Intravenous,  Once, 11 of 11 cycles Administration: 700 mg (07/19/2019), 700 mg (08/16/2019), 700 mg (09/13/2019), 700 mg (10/10/2019), 700 mg (11/07/2019), 700 mg (12/07/2019), 700 mg (01/04/2020), 700 mg (02/01/2020), 700 mg (03/05/2020), 700 mg (04/02/2020), 700 mg (04/30/2020) riTUXimab-pvvr (RUXIENCE) 700 mg in sodium chloride 0.9 % 250 mL (2.1875 mg/mL) infusion, 375 mg/m2 = 700 mg (original dose ), Intravenous,  Once, 0 of 1 cycle Dose modification: 375 mg/m2 (Cycle 12)  for chemotherapy treatment.     PHYSICAL EXAMINATION:  Vitals:   07/07/20 0808 07/07/20 0947  BP: 132/62 134/68  Pulse: (!) 133 (!) 141  Resp: (!) 24 (!) 24  Temp: 97.9 F (36.6 C) 98.2 F (36.8 C)  SpO2: 93% 94%   [employer]  06/23/20 1206  Weight: 78.9 kg    Intake/Output from previous day: 07/18 0701 - 07/19 0700 In: 1227.5 [P.O.:540; I.V.:587.5; IV Piggyback:100] Out: 0   GENERAL: Alert HEENT: No thrush LYMPH: Soft mobile 1-2 cm left cervical and left axillary nodes LUNGS: Increased respiratory rate, decreased  breath sounds throughout the right chest, bilateral expiratory wheezing HEART: Tachycardic ABDOMEN: Nontender, no mass, mildly distended SKIN: Multiple ecchymoses noted on her bilateral arms, legs, and trunk NEURO: Alert, follows commands, moves all extremities, speaking appropriately  LABORATORY DATA:  I have reviewed the data as listed CMP Latest Ref Rng & Units 07/07/2020 07/06/2020 07/05/2020  Glucose 70 - 99 mg/dL 107(H) 114(H) 96  BUN 8 - 23 mg/dL '9 9 14  ' Creatinine 0.44 - 1.00 mg/dL 0.50 0.53 0.57  Sodium 135 - 145 mmol/L 141 140 135  Potassium 3.5 - 5.1 mmol/L 3.8 3.7 3.5  Chloride 98 - 111 mmol/L 105 106 102  CO2 22 - 32 mmol/L '26 28 27  ' Calcium 8.9 - 10.3 mg/dL 7.3(L) 7.0(L) 6.8(L)  Total Protein 6.5 - 8.1 g/dL 5.1(L) 5.0(L) 4.7(L)  Total Bilirubin 0.3 - 1.2 mg/dL 0.8 0.9 0.9  Alkaline Phos 38 - 126 U/L 161(H) 166(H) 164(H)  AST 15 - 41 U/L 52(H) 64(H) 81(H)  ALT 0 - 44 U/L 31 35 41    Lab Results  Component Value Date   WBC 4.2 07/07/2020   HGB 11.5 (L) 07/07/2020   HCT 36.0 07/07/2020   MCV 107.1 (H) 07/07/2020   PLT 111 (L) 07/07/2020   NEUTROABS 2.8 06/23/2020    DG Chest 1 View  Result Date: 07/04/2020 CLINICAL DATA:  Status post right thoracentesis. EXAM: CHEST  1 VIEW COMPARISON:  Chest x-ray from yesterday. FINDINGS: Unchanged right chest wall port catheter. The heart size and mediastinal contours are within normal limits. Normal pulmonary vascularity. Trace residual right pleural effusion status post thoracentesis. Improved aeration at the right lung base. No pneumothorax. Unchanged small left pleural effusion and basilar atelectasis. No acute osseous abnormality. IMPRESSION: 1. Trace residual right pleural effusion status post thoracentesis. No pneumothorax. 2. Unchanged small left pleural effusion and basilar atelectasis. Electronically Signed   By: Titus Dubin M.D.   On: 07/04/2020 11:42   DG Chest 2 View  Result Date: 07/06/2020 CLINICAL DATA:   Worsening shortness of breath. EXAM: CHEST - 2 VIEW COMPARISON:  07/04/2020 FINDINGS: The cardiac silhouette remains grossly normal in size, spur partially obscured by significantly increased patchy opacity in the right lower lung zone. Small amount of posterior pleural fluid. Mild linear density at the left lung base. Right subclavian porta catheter tip in the region of the superior cavoatrial junction. Unremarkable bones. IMPRESSION: 1. Significantly increased right lower lung zone pneumonia. 2. Small amount of posterior pleural fluid. 3. Mild left basilar atelectasis. Electronically Signed   By: Claudie Revering M.D.   On: 07/06/2020 16:24   DG Chest 2 View  Result Date: 07/03/2020 CLINICAL DATA:  Dyspnea, pleural effusions EXAM: CHEST - 2 VIEW COMPARISON:  06/26/2020 FINDINGS: Frontal and lateral views of the chest demonstrates stable right chest wall port. Cardiac silhouette is unremarkable. There is now a moderate right pleural effusion with right basilar consolidation. Decreased left pleural effusion, with only trace fluid at the left costophrenic angle. Persistent retrocardiac consolidation. No pneumothorax. No acute bony abnormalities. IMPRESSION: 1. Decreased left pleural effusion, with persistent left basilar consolidation. 2. Increasing right pleural effusion, with increasing right basilar consolidation. Electronically Signed   By: Randa Ngo  M.D.   On: 07/03/2020 16:11   DG Chest 2 View  Result Date: 06/26/2020 CLINICAL DATA:  Dyspnea. EXAM: CHEST - 2 VIEW COMPARISON:  Chest CT 05/22/2020 FINDINGS: Right chest port remains in place. Hazy opacity at the left lung base consistent with pleural effusion and airspace disease/atelectasis. Right lung is clear. Upper normal heart size. Mild right hilar prominence likely secondary to combination of vascular structures and adenopathy is seen on recent CT. Fullness in the upper mediastinum likely secondary to adenopathy. No pneumothorax. No pulmonary edema.  Surgical clips in the left axilla. IMPRESSION: 1. Hazy opacity at the left lung base consistent with pleural effusion and airspace disease/atelectasis. 2. Mild right hilar prominence likely secondary to combination of vascular structures and adenopathy seen on recent CT. Electronically Signed   By: Keith Rake M.D.   On: 06/26/2020 15:54   CT Abdomen Pelvis W Contrast  Result Date: 06/23/2020 CLINICAL DATA:  86 year old female with abdominal pain, nausea and vomiting. History of lymphoma. EXAM: CT ABDOMEN AND PELVIS WITH CONTRAST TECHNIQUE: Multidetector CT imaging of the abdomen and pelvis was performed using the standard protocol following bolus administration of intravenous contrast. CONTRAST:  20m OMNIPAQUE IOHEXOL 300 MG/ML  SOLN COMPARISON:  CT of the chest abdomen pelvis dated 05/22/2020. FINDINGS: Lower chest: Partially visualized moderate left and trace right pleural effusions, new since the prior CT. There is associated partial compressive atelectasis of the left lower lobe. Pneumonia is not excluded. Clinical correlation is recommended. No intra-abdominal free air. Interval development of a small ascites, new or significantly increased since the prior CT. Hepatobiliary: A 1 cm hypodense focus along the posterior liver capsule is not characterized but may represent a cyst or focal area of scarring. This is similar to prior CT. Subcentimeter hypodense focus in the anterior liver is too small to characterize. There is mild intrahepatic biliary ductal dilatation, likely post cholecystectomy. The common bile duct is dilated measuring 14 mm. No retained calcified stone noted in the central CBD. Pancreas: The pancreas is unremarkable as visualized. Spleen: Mildly enlarged spleen measuring 15 cm in craniocaudal length. Adrenals/Urinary Tract: The adrenal glands are poorly visualized but grossly unremarkable. There is mild fullness of the renal collecting systems bilaterally without frank hydronephrosis.  Mild hazy appearance of the urothelium noted. Correlation with urinalysis recommended to exclude UTI. There is symmetric enhancement and excretion of contrast by both kidneys. Subcentimeter left renal hypodense focus is not characterized. The urinary bladder is partially distended. Probable chronic bladder wall thickening and perivesical stranding. Correlation with urinalysis recommended to exclude UTI. Stomach/Bowel: There is sigmoid diverticulosis without definite active inflammatory changes. There is postsurgical changes of bowel with anastomotic suture in the right lower quadrant. No definite evidence of bowel obstruction. Evaluation however is limited in the absence of oral contrast. Vascular/Lymphatic: There is moderate aortoiliac atherosclerotic disease. The IVC is unremarkable as visualized. The SMV, splenic vein, and main portal vein are patent. No portal venous gas. Bulky retroperitoneal adenopathy encasing the abdominal aorta and IVC as well as enlarged bilateral iliac chain lymph nodes in keeping with history of lymphoma. Overall the degree of adenopathy is relatively similar to prior CT. Reproductive: The uterus is poorly visualized. Other: Diffuse mesenteric edema, new since the prior CT. There is scattered mesenteric adenopathy as seen previously. Musculoskeletal: Degenerative changes of the spine. Minimal compression fracture of the anterior superior and inferior endplates of the L3, new since the prior CT, likely acute or subacute. IMPRESSION: 1. Interval development of partially visualized moderate left  and trace right pleural effusions, as well as development of diffuse mesenteric edema and ascites, new since the prior CT. 2. Bulky retroperitoneal and iliac chain adenopathy in keeping with history of lymphoma. Overall the degree of adenopathy is relatively similar to prior CT. 3. Mild splenomegaly. 4. Minimal compression fracture of the superior and inferior endplate of L3, likely acute or  subacute. 5. Sigmoid diverticulosis. 6. Aortic Atherosclerosis (ICD10-I70.0). Electronically Signed   By: Anner Crete M.D.   On: 06/23/2020 16:39   [place] ASCITES (ABDOMEN LIMITED)  Result Date: 07/01/2020 CLINICAL DATA:  History of lymphoma, now with concern for symptomatic intra-abdominal ascites. Please perform ascites search ultrasound and ultrasound-guided paracentesis as indicated. EXAM: LIMITED ABDOMEN ULTRASOUND FOR ASCITES TECHNIQUE: Limited ultrasound survey for ascites was performed in all four abdominal quadrants. COMPARISON:  CT abdomen pelvis-06/23/2020 FINDINGS: Sonographic evaluation demonstrates a trace amount of intra-abdominal ascites, too small to allow for safe ultrasound-guided paracentesis. Note is made of small bilateral pleural effusions, the right side of which appears new compared to abdominal CT performed 06/23/2020. IMPRESSION: 1. Trace amount of intra-abdominal ascites, too small to allow for safe ultrasound-guided paracentesis. No paracentesis attempted. 2. Small bilateral pleural effusions - the left-sided pleural effusion appears similar to abdominal CT performed 06/23/2020 however the right-sided pleural effusion appears new of the CT. Electronically Signed   By: Sandi Mariscal M.D.   On: 07/01/2020 15:11   US Abdomen Limited RUQ  Result Date: 07/02/2020 CLINICAL DATA:  Abnormal LFTs EXAM: ULTRASOUND ABDOMEN LIMITED RIGHT UPPER QUADRANT COMPARISON:  CT 06/23/2020.  Ultrasound 07/01/2020 FINDINGS: Gallbladder: Prior cholecystectomy Common bile duct: Diameter: Prominent measuring up to 10 mm, likely related to post cholecystectomy state. Liver: Heterogeneous, slightly increased echotexture. Subtle nodular contours to the liver surface. Findings raise the possibility of cirrhosis. No suspicious focal hepatic abnormality. Portal vein is patent on color Doppler imaging with normal direction of blood flow towards the liver. Other: Ascites and right effusion noted. Prominent porta  hepatis lymph nodes. IMPRESSION: Heterogeneous, increased echotexture throughout the liver. Subtle nodular contour seen in some areas of the liver. Findings suggest the possibility of cirrhosis. Perihepatic ascites and right pleural effusion. Mild biliary ductal dilatation, likely related to post cholecystectomy state. Prominent porta hepatis lymph nodes, likely related to liver disease. Electronically Signed   By: Rolm Baptise M.D.   On: 07/02/2020 08:52   US THORACENTESIS ASP PLEURAL SPACE W/IMG GUIDE  Result Date: 07/04/2020 INDICATION: Patient with history of lymphoma, remote colon and thyroid cancers, dyspnea, right pleural effusion. Request made for diagnostic and therapeutic right thoracentesis. EXAM: ULTRASOUND GUIDED DIAGNOSTIC AND THERAPEUTIC RIGHT THORACENTESIS MEDICATIONS: 1% lidocaine to skin and subcutaneous tissue COMPLICATIONS: None immediate. PROCEDURE: An ultrasound guided thoracentesis was thoroughly discussed with the patient and questions answered. The benefits, risks, alternatives and complications were also discussed. The patient understands and wishes to proceed with the procedure. Written consent was obtained. Ultrasound was performed to localize and mark an adequate pocket of fluid in the right chest. The area was then prepped and draped in the normal sterile fashion. 1% Lidocaine was used for local anesthesia. Under ultrasound guidance a 6 Fr Safe-T-Centesis catheter was introduced. Thoracentesis was performed. The catheter was removed and a dressing applied. FINDINGS: A total of approximately 1.6 liters of blood-tinged fluid was removed. Samples were sent to the laboratory as requested by the clinical team. IMPRESSION: Successful ultrasound guided diagnostic and therapeutic right thoracentesis yielding 1.6 liters of pleural fluid. Read by: Rowe Robert, PA-C Electronically Signed  By: Lucrezia Europe M.D.   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 Status post weekly Rituxan x4 03/01/2012 through 03/22/2012. She completed 4 "maintenance" doses of Rituxan, last on 12/19/2012. A restaging CT on 02/09/2013 showed no evidence of lymphoma.   Lymph node lateral to the thyroid bed on a neck ultrasound 02/21/2014, status post an FNA biopsy concerning for a lymphoproliferative disorder.  PET scan 09/28/2016 with active lymphoma within the neck, chest, abdomen, pelvis; splenic enlargement and hypermetabolism suspicious for splenic involvement.  Initiation of Rituxan weekly 4 09/29/2016  Initiation of maintenance Rituxan on a 3 month schedule 12/23/2016; final Rituxan 08/31/2018  Thyroid ultrasound 02/07/2019-left cervical lymphadenopathy  PET scan 03/08/2019-extensive recurrent hypermetabolic lymphoma involving the neck, chest, abdomen and pelvis.  03/16/2019 left cervical lymph node biopsy-features consistent with previously diagnosed non-Hodgkin's B-cell lymphoma, phenotypically consistent with marginal zone lymphoma. Flow cytometry with lambda restricted B-cell population without expression of CD5 or CD10 comprising 87% of all lymphocytes.  Cycle 1 bendamustine/Rituxan 03/22/2019  Excision deep left axillary lymph nodes 05/04/2019-non-Hodgkin's B-cell lymphoma with differential including a marginal zone lymphoma and atypical small lymphocytic lymphoma. Flow cytometry with monoclonal B-cell population without expression of CD5 or CD10, comprises 96% of all lymphocytes.  Cycle 1 [facility]/Rituxan 05/18/2019  Cycle 2 [facility]/Rituxan 06/08/2019  CTs 06/15/2019-partial improvement in diffuse adenopathy, stable mild splenomegaly  Cycle 1 Revlimid/rituximab 07/19/2019 (Revlimid start 07/20/2019)  Cycle 2 Revlimid/rituximab 08/16/2019 (Revlimid placed on hold 08/30/2019 due to neutropenia)  Cycle 3 of Revlimid/rituximab 09/13/2019 (Revlimid  schedule changed to 14 days on/14 days off)  Cycle 4 Revlimid/rituximab 10/10/2019  Cycle 5 Revlimid/rituximab 11/07/2019  Cycle 6 Revlimid/rituximab 12/07/2019  CTs 12/28/2019-diffuse lymphadenopathy-slightly increased compared to 06/15/2019  Cycle 7 Revlimid/rituximab 01/04/2020  Cycle 8 Revlimid/Rituxan 02/01/2020  Cycle 9 Revlimid/Rituxan 03/05/2020  Cycle 10 Revlimid/Rituxan 04/02/2020  Cycle 11 Revlimid/Rituxan 04/30/2020  CT 05/22/2020-mild increase in left supraclavicular adenopathy, mild increase in chest, retroperitoneal, and pelvic adenopathy. Stable mild splenomegaly.  Acalabrutinib started 06/19/2020  CT abdomen/pelvis 06/23/2020-moderate left and trace right pleural effusions, new diffuse mesenteric edema and ascites, stable retroperitoneal and iliac adenopathy, mild splenomegaly 2. Stage IV (T1bN1b M0) papillary thyroid cancer, status post a thyroidectomy with reimplantation of the left superior parathyroid gland on 05/23/2012, status post radioactive iodine therapy, followed by Dr. Buddy Duty.  3. Stage II (T3 N0) colon cancer, status post a right colectomy 10/19/2011, last colonoscopy April 2015-sigmoid adenoma removed.  4. History of a pulmonary embolism December 2012.  5. History of Atrial fibrillation 6. Iron deficiency anemia-new 03/18/2014. Hemoccult positive stool. The anemia corrected with iron. No longer taking iron.  Status post an upper endoscopy and colonoscopy by Dr. Carlean Purl April 2015 with no bleeding source identified, benign adenoma removed from the sigmoid colon. 7. Report of an upper gastric intestinal bleed fall 2016-managed in [place]. [name] 8. Left knee replacement May 2017, repeat left knee surgery May 2018 9. Pruritic rash 07/22/2016 10. Nausea and diarrhea 04/02/2019-stool negative for C. difficile toxin 11. 06/18/2019 [facility] admission for symptomatic anemia. 12.  Admission 06/23/2020 with nausea and diarrhea  Ms. Nilsen has now developed tachycardia and  continues to have shortness of breath.  She is in the process of being transferred to the telemetry unit for close monitoring.  She had a thoracentesis performed on 7/16 and cytology is still pending.  Leukopenia has resolved.  Her hemoglobin and platelets have improved  this morning.  Liver enzymes continue to improve. Acalabrutinib will remain on hold.  Recommendations: 1.  Continue to hold acalabrutinib 2.  EKG today secondary to tachycardia 3.  Recommend CTA chest to evaluate for PE and to reevaluate her pleural effusion.   LOS: 13 days   Mikey Bussing 07/07/20 Ms. Judy was interviewed and examined.  She continues to have respiratory failure.  She has developed tachycardia this morning.  It is unclear whether the cardiopulmonary symptoms are related to infection or another etiology.  The palpable lymph nodes remain significantly smaller compared to when she began acalabrutinib. I agree with a chest CT and cardiology evaluation.  Acalabrutinib will remain on hold.

## 2020-07-08 ENCOUNTER — Inpatient Hospital Stay (HOSPITAL_COMMUNITY): Payer: Medicare PPO

## 2020-07-08 DIAGNOSIS — R112 Nausea with vomiting, unspecified: Secondary | ICD-10-CM | POA: Diagnosis not present

## 2020-07-08 LAB — COMPREHENSIVE METABOLIC PANEL
ALT: 26 U/L (ref 0–44)
AST: 37 U/L (ref 15–41)
Albumin: 2.6 g/dL — ABNORMAL LOW (ref 3.5–5.0)
Alkaline Phosphatase: 156 U/L — ABNORMAL HIGH (ref 38–126)
Anion gap: 7 (ref 5–15)
BUN: 9 mg/dL (ref 8–23)
CO2: 30 mmol/L (ref 22–32)
Calcium: 7.3 mg/dL — ABNORMAL LOW (ref 8.9–10.3)
Chloride: 106 mmol/L (ref 98–111)
Creatinine, Ser: 0.5 mg/dL (ref 0.44–1.00)
GFR calc Af Amer: >60 mL/min (ref >60)
GFR calc non Af Amer: >60 mL/min (ref >60)
Glucose, Bld: 109 mg/dL — ABNORMAL HIGH (ref 70–99)
Potassium: 3.8 mmol/L (ref 3.5–5.1)
Sodium: 143 mmol/L (ref 135–145)
Total Bilirubin: 0.7 mg/dL (ref 0.3–1.2)
Total Protein: 4.8 g/dL — ABNORMAL LOW (ref 6.5–8.1)

## 2020-07-08 LAB — GLUCOSE, CAPILLARY
Glucose-Capillary: 106 mg/dL — ABNORMAL HIGH (ref 70–99)
Glucose-Capillary: 108 mg/dL — ABNORMAL HIGH (ref 70–99)
Glucose-Capillary: 115 mg/dL — ABNORMAL HIGH (ref 70–99)

## 2020-07-08 LAB — BODY FLUID CELL COUNT WITH DIFFERENTIAL
Eos, Fluid: 0 %
Lymphs, Fluid: 29 %
Monocyte-Macrophage-Serous Fluid: 70 % (ref 50–90)
Neutrophil Count, Fluid: 1 % (ref 0–25)
Other Cells, Fluid: 0 %
Total Nucleated Cell Count, Fluid: 1211 cu mm — ABNORMAL HIGH (ref 0–1000)

## 2020-07-08 LAB — CBC
HCT: 33.4 % — ABNORMAL LOW (ref 36.0–46.0)
Hemoglobin: 10.5 g/dL — ABNORMAL LOW (ref 12.0–15.0)
MCH: 34 pg (ref 26.0–34.0)
MCHC: 31.4 g/dL (ref 30.0–36.0)
MCV: 108.1 fL — ABNORMAL HIGH (ref 80.0–100.0)
Platelets: 117 10*3/uL — ABNORMAL LOW (ref 150–400)
RBC: 3.09 MIL/uL — ABNORMAL LOW (ref 3.87–5.11)
RDW: 15.3 % (ref 11.5–15.5)
WBC: 3.7 10*3/uL — ABNORMAL LOW (ref 4.0–10.5)
nRBC: 0 % (ref 0.0–0.2)

## 2020-07-08 LAB — MAGNESIUM: Magnesium: 1.7 mg/dL (ref 1.7–2.4)

## 2020-07-08 LAB — GRAM STAIN: Special Requests: ADEQUATE

## 2020-07-08 LAB — PROTEIN, PLEURAL OR PERITONEAL FLUID: Total protein, fluid: <3 g/dL

## 2020-07-08 LAB — PROCALCITONIN: Procalcitonin: <0.1 ng/mL

## 2020-07-08 LAB — TSH: TSH: 0.33 u[IU]/mL — ABNORMAL LOW (ref 0.350–4.500)

## 2020-07-08 MED ORDER — PRO-STAT SUGAR FREE PO LIQD
30.0000 mL | Freq: Every day | ORAL | Status: DC
  Administered 2020-07-08 – 2020-07-16 (×6): 30 mL via ORAL
  Filled 2020-07-08 (×7): qty 30

## 2020-07-08 MED ORDER — POTASSIUM CHLORIDE CRYS ER 20 MEQ PO TBCR
20.0000 meq | EXTENDED_RELEASE_TABLET | Freq: Once | ORAL | Status: AC
  Administered 2020-07-08: 20 meq via ORAL
  Filled 2020-07-08: qty 1

## 2020-07-08 MED ORDER — CITALOPRAM HYDROBROMIDE 20 MG PO TABS
20.0000 mg | ORAL_TABLET | Freq: Every day | ORAL | Status: DC
  Administered 2020-07-09 – 2020-07-25 (×17): 20 mg via ORAL
  Filled 2020-07-08 (×17): qty 1

## 2020-07-08 MED ORDER — METOPROLOL TARTRATE 25 MG PO TABS
12.5000 mg | ORAL_TABLET | Freq: Two times a day (BID) | ORAL | Status: DC
  Administered 2020-07-08 – 2020-07-22 (×18): 12.5 mg via ORAL
  Filled 2020-07-08 (×30): qty 1

## 2020-07-08 MED ORDER — FUROSEMIDE 10 MG/ML IJ SOLN
20.0000 mg | Freq: Two times a day (BID) | INTRAMUSCULAR | Status: DC
  Administered 2020-07-08 – 2020-07-09 (×2): 20 mg via INTRAVENOUS
  Filled 2020-07-08 (×2): qty 2

## 2020-07-08 MED ORDER — KATE FARMS STANDARD 1.4 PO LIQD
325.0000 mL | Freq: Two times a day (BID) | ORAL | Status: DC
  Administered 2020-07-09 – 2020-07-15 (×4): 325 mL via ORAL
  Filled 2020-07-08 (×15): qty 325

## 2020-07-08 MED ORDER — ADULT MULTIVITAMIN W/MINERALS CH
1.0000 | ORAL_TABLET | Freq: Every day | ORAL | Status: DC
  Administered 2020-07-08 – 2020-07-25 (×18): 1 via ORAL
  Filled 2020-07-08 (×17): qty 1

## 2020-07-08 NOTE — Progress Notes (Signed)
Initial Nutrition Assessment  DOCUMENTATION CODES:   Not applicable  INTERVENTION:  - will order [employer] 1.4 BID, each supplement provides 455 kcal and 20 grams protein. - will order 30 mL Prosource Plus once/day, each supplement provides 100 kcal and 15 grams of protein. - will order 1 tablet multivitamin with minerals/day. - will perform NFPE at follow-up. - weigh patient today.    NUTRITION DIAGNOSIS:   Increased nutrient needs related to acute illness, cancer and cancer related treatments, chronic illness as evidenced by estimated needs.  GOAL:   Patient will meet greater than or equal to 90% of their needs  MONITOR:   PO intake, Supplement acceptance, Labs, Weight trends  REASON FOR ASSESSMENT:   LOS (day #14)  ASSESSMENT:   86 y.o. female with medical history of low-grade lymphoma without significant response to chemotherapy last year and was recently started on BTK inhibitor, hypothyroidism, and A. fib. She presented to the ED with 3-4 day history of N/V/D and abdominal pain (RUQ). Symptoms started after new chemo initiation. UA on admission was positive for UTI. CT abdomen in the ED showed free fluid, ascites, and mesenteric edema. She was re-started on chemo during this admission but continues to have nausea and anorexia which is thought to be 2/2 lymphoma and treatment; started on prednisone to aid with appetite.  Patient has been eating 0-25% over the past 1 week; most recently: 25% of breakfast this AM. Will add oral nutrition supplements to increase kcal and protein availability between meals.   She has not been weighed since 7/5 at which time she weighed 174 lb. Weight on 5/12 was 178 lb. This indicates 4 lb weight loss (2.2% body weight) in the past 2 months; not significant for time frame. Weight was mainly stable from 11/2019-04/2020.  Per notes: - new onset ascites which is too small for thoracentesis - prednisone was ordered for appetite stimulant but led  to confusion so it was stopped - non-Hodgkin's lymphoma with progressive decline - FTT--declined SNF and [place] reviewed; CBGs: 108, 106, 115 mg/dl, Ca: 7.3 mg/dl. Medications reviewed; 20 mg IV lasix BID, 224 mcg oral synthroid/day, 3 mg melatonin/day, 40 mg oral protonix/day, 20 mEq Klor-Con x1 dose 7/20.     NUTRITION - FOCUSED PHYSICAL EXAM:  unable to complete at this time.   Diet Order:   Diet Order            Diet regular Room service appropriate? Yes; Fluid consistency: Thin  Diet effective now                 EDUCATION NEEDS:   No education needs have been identified at this time  Skin:  Skin Assessment: Reviewed RN Assessment  Last BM:  7/18  Height:   Ht Readings from Last 1 Encounters:  06/23/20 5\' 8"  (1.727 m)    Weight:   Wt Readings from Last 1 Encounters:  06/23/20 78.9 kg     Estimated Nutritional Needs:  Kcal:  1700-1900 kcal Protein:  75-85 grams Fluid:  >/= 2 L/day     Jarome Matin, MS, RD, LDN, CNSC Inpatient Clinical Dietitian RD pager # available in [facility]  After hours/weekend pager # available in [facility]

## 2020-07-08 NOTE — TOC Initial Note (Signed)
Transition of Care [facility]) - Initial/Assessment Note    Patient Details  Name: Tracy Mclaughlin MRN: [id number] Date of Birth: 07/19/1934  Transition of Care [facility]) CM/SW Contact:    Purcell Mouton, RN Phone Number: 07/08/2020, 3:00 PM  Clinical Narrative:                  Spoke with pt concerning SNF/HH pt continue to decline both.        Patient Goals and CMS Choice        Expected Discharge Plan and Services                                                Prior Living Arrangements/Services                       Activities of Daily Living Home Assistive Devices/Equipment: Dentures (specify type), Eyeglasses, Cane (specify quad or straight), Walker (specify type) (single point cane with a flashlight, front wheeled walker, upper/lower dentures) ADL Screening (condition at time of admission) Patient's cognitive ability adequate to safely complete daily activities?: Yes Is the patient deaf or have difficulty hearing?: No Does the patient have difficulty seeing, even when wearing glasses/contacts?: No Does the patient have difficulty concentrating, remembering, or making decisions?: No Patient able to express need for assistance with ADLs?: Yes Does the patient have difficulty dressing or bathing?: No Independently performs ADLs?: Yes (appropriate for developmental age) Does the patient have difficulty walking or climbing stairs?: Yes (secondary to weakness) Weakness of Legs: Both Weakness of Arms/Hands: None  Permission Sought/Granted                  Emotional Assessment              Admission diagnosis:  Other ascites [R18.8] Ascites [R18.8] Urinary tract infection without hematuria, site unspecified [N39.0] Patient Active Problem List   Diagnosis Date Noted  . Nausea & vomiting 06/23/2020  . UTI (urinary tract infection) 06/23/2020  . Ascites 06/23/2020  . Anemia 06/18/2019  . Pancytopenia ([place]) 06/18/2019  .  Port-A-Cath in place 05/18/2019  . Goals of care, counseling/discussion 03/21/2019  . Hypersensitivity reaction 09/29/2016  . PAF (paroxysmal atrial fibrillation) ([place]) 07/07/2016  . Personal history of colonic polyps - cancer and adenoma 04/17/2014  . Anemia, iron deficiency 04/02/2014  . Heme + stool 04/02/2014  . Personal history of colon cancer 04/02/2014  . Primary thyroid cancer ([place]) 05/01/2012  . Colon cancer ([place]) 11/24/2011  . Pulmonary embolism ([place]) 11/24/2011  . Non Hodgkin's lymphoma ([place])   . Subarachnoid hemorrhage ([place])   . Arthritis   . Hypertension    PCP:  [name], PA Pharmacy:   [place], [place] - [address] [place] [address] Phone: [phone] Fax: [phone]     Social Determinants of Health (SDOH) Interventions    Readmission Risk Interventions No flowsheet data found.

## 2020-07-08 NOTE — Procedures (Signed)
PROCEDURE SUMMARY:  Successful US guided right thoracentesis. Yielded 500 mL of red fluid. Patient tolerated procedure well. No immediate complications. EBL = trace  Specimen was sent for labs.  Post procedure chest X-ray pending.  Saharsh Sterling S Tylin Stradley PA-C 07/08/2020 4:24 PM

## 2020-07-08 NOTE — Progress Notes (Addendum)
Progress Note  Patient Name: Tracy Mclaughlin Date of Encounter: 07/08/2020  Primary Cardiologist: Sinclair Grooms, MD  Subjective   "I don't know how I feel." Denies acute complaints.  Inpatient Medications    Scheduled Meds: . azithromycin  500 mg Oral Daily  . Chlorhexidine Gluconate Cloth  6 each Topical Daily  . citalopram  40 mg Oral Daily  . diltiazem  180 mg Oral QHS  . diltiazem  10 mg Intravenous Once  . heparin lock flush  500 Units Intracatheter Q30 days  . levothyroxine  224 mcg Oral Q0600  . melatonin  3 mg Oral QHS  . pantoprazole  40 mg Oral Daily   Continuous Infusions: . sodium chloride 10 mL/hr at 07/07/20 0945  . cefTRIAXone (ROCEPHIN)  IV 1 g (07/07/20 1703)   PRN Meds: sodium chloride, acetaminophen **OR** acetaminophen, albuterol, ALPRAZolam, alum & mag hydroxide-simeth, diphenhydrAMINE, heparin lock flush **AND** heparin lock flush, loperamide, ondansetron **OR** ondansetron (ZOFRAN) IV, sodium chloride flush   Vital Signs    Vitals:   07/07/20 2117 07/08/20 0127 07/08/20 0500 07/08/20 0750  BP: (!) 121/45 (!) 126/52 (!) 133/48 (!) 146/54  Pulse: 76 75 71 80  Resp: 19 (!) 21 (!) 33 (!) 24  Temp: 97.7 F (36.5 C)  97.8 F (36.6 C) 98.3 F (36.8 C)  TempSrc: Axillary  Axillary Oral  SpO2: 99% 97% 99% 96%  Weight:      Height:        Intake/Output Summary (Last 24 hours) at 07/08/2020 0753 Last data filed at 07/08/2020 0600 Gross per 24 hour  Intake 232.17 ml  Output --  Net 232.17 ml   Last 3 Weights 06/23/2020 06/13/2020 05/28/2020  Weight (lbs) 174 lb 177 lb 14.4 oz 175 lb 9.6 oz  Weight (kg) 78.926 kg 80.695 kg 79.652 kg     Telemetry    NSR, 2 brief runs of narrow complex tachycardia on telemetry this morning - Personally Reviewed  Physical Exam   GEN: No acute distress.  HEENT: Normocephalic, atraumatic, sclera non-icteric. Neck: No JVD or bruits. Cardiac: RRR no murmurs, rubs, or gallops.  Radials/DP/PT 1+ and equal  bilaterally.  Respiratory: Diminished BS at bases bilaterally. Breathing is unlabored. GI: Soft, nontender, non-distended, BS +x 4. MS: no deformity. Extremities: No clubbing or cyanosis. No edema. Distal pedal pulses are 2+ and equal bilaterally. Neuro:  Oriented to self, says she is at [facility] and June 2021. Follows commands. Psych: Somewhat flat, bewildered appearing affect  Labs    High Sensitivity Troponin:  No results for input(s): TROPONINIHS in the last 720 hours.    Cardiac EnzymesNo results for input(s): TROPONINI in the last 168 hours. No results for input(s): TROPIPOC in the last 168 hours.   Chemistry Recent Labs  Lab 07/06/20 0510 07/07/20 0544 07/08/20 0340  NA 140 141 143  K 3.7 3.8 3.8  CL 106 105 106  CO2 28 26 30   GLUCOSE 114* 107* 109*  BUN 9 9 9   CREATININE 0.53 0.50 0.50  CALCIUM 7.0* 7.3* 7.3*  PROT 5.0* 5.1* 4.8*  ALBUMIN 2.5* 2.6* 2.6*  AST 64* 52* 37  ALT 35 31 26  ALKPHOS 166* 161* 156*  BILITOT 0.9 0.8 0.7  GFRNONAA >60 >60 >60  GFRAA >60 >60 >60  ANIONGAP 6 10 7      Hematology Recent Labs  Lab 07/06/20 0510 07/07/20 0544 07/08/20 0340  WBC 3.2* 4.2 3.7*  RBC 3.14* 3.36* 3.09*  HGB 10.9*  11.5* 10.5*  HCT 33.8* 36.0 33.4*  MCV 107.6* 107.1* 108.1*  MCH 34.7* 34.2* 34.0  MCHC 32.2 31.9 31.4  RDW 15.1 15.0 15.3  PLT 87* 111* 117*    BNP Recent Labs  Lab 07/07/20 1640  BNP 400.4*     DDimer No results for input(s): DDIMER in the last 168 hours.   Radiology    DG Chest 2 View  Result Date: 07/06/2020 CLINICAL DATA:  Worsening shortness of breath. EXAM: CHEST - 2 VIEW COMPARISON:  07/04/2020 FINDINGS: The cardiac silhouette remains grossly normal in size, spur partially obscured by significantly increased patchy opacity in the right lower lung zone. Small amount of posterior pleural fluid. Mild linear density at the left lung base. Right subclavian porta catheter tip in the region of the superior cavoatrial  junction. Unremarkable bones. IMPRESSION: 1. Significantly increased right lower lung zone pneumonia. 2. Small amount of posterior pleural fluid. 3. Mild left basilar atelectasis. Electronically Signed   By: Claudie Revering M.D.   On: 07/06/2020 16:24   CT ANGIO CHEST PE W OR WO CONTRAST  Result Date: 07/07/2020 CLINICAL DATA:  Lymphoma, recurrent right pleural effusion bloody tap EXAM: CT ANGIOGRAPHY CHEST WITH CONTRAST TECHNIQUE: Multidetector CT imaging of the chest was performed using the standard protocol during bolus administration of intravenous contrast. Multiplanar CT image reconstructions and MIPs were obtained to evaluate the vascular anatomy. CONTRAST:  138mL OMNIPAQUE IOHEXOL 350 MG/ML SOLN COMPARISON:  None. FINDINGS: Cardiovascular: Aortic atherosclerosis. Normal heart size. Central pulmonary arteries are patent. Mediastinum/Nodes: Adenopathy is identified in the supraclavicular region, bilateral axilla, and mediastinum. Nodes appear and measures slightly smaller in size. Lungs/Pleura: Moderate right and mild to moderate left pleural effusions measuring simple fluid in density. Imaging performed during expiration. Persistent atelectasis at the left lung base. New atelectasis/consolidation at the right lung base. Interlobular septal thickening the non dependent right lung may reflect asymmetric edema. Upper Abdomen: Ascites. Unchanged ill-defined hypoattenuating lesion in the superior spleen. Musculoskeletal: No new abnormality. Review of the MIP images confirms the above findings. IMPRESSION: Moderate right and mild to moderate left pleural effusions. Right basilar atelectasis/consolidation and left basilar atelectasis. Interlobular septal thickening in the right lung may reflect asymmetric edema. Multifocal adenopathy appears stable to slightly improved. Electronically Signed   By: Macy Mis M.D.   On: 07/07/2020 14:33   ECHOCARDIOGRAM COMPLETE  Result Date: 07/07/2020    ECHOCARDIOGRAM  REPORT   Patient Name:   Tracy Mclaughlin Date of Exam: 07/07/2020 Medical Rec #:  [id number]            Height:       68.0 in Accession #:    [id number]           Weight:       174.0 lb Date of Birth:  06/01/1934           BSA:          1.926 m Patient Age:    86 years             BP:           132/65 mmHg Patient Gender: F                    HR:           855 bpm. Exam Location:  Inpatient Procedure: 2D Echo Indications:    Atrial tachycardia [facility]) [[id number]]  History:        Patient has prior history of  Echocardiogram examinations, most                 recent 05/31/2019. Risk Factors:Hypertension. Lymphoma , thyroid                 disease, thyroid cancer. Past history of afib.  Sonographer:    Darlina Sicilian RDCS Referring Phys: [place]  1. Left ventricular ejection fraction, by estimation, is 60 to 65%. The left ventricle has normal function. The left ventricle has no regional wall motion abnormalities. Left ventricular diastolic parameters were normal.  2. Right ventricular systolic function is normal. The right ventricular size is normal.  3. Moderate pleural effusion in both left and right lateral regions.  4. The mitral valve is normal in structure. Trivial mitral valve regurgitation. No evidence of mitral stenosis.  5. The aortic valve is tricuspid. Aortic valve regurgitation is not visualized. No aortic stenosis is present.  6. The inferior vena cava is normal in size with <50% respiratory variability, suggesting right atrial pressure of 8 mmHg. Conclusion(s)/Recommendation(s): Normal biventricular function without evidence of hemodynamically significant valvular heart disease. FINDINGS  Left Ventricle: Left ventricular ejection fraction, by estimation, is 60 to 65%. The left ventricle has normal function. The left ventricle has no regional wall motion abnormalities. The left ventricular internal cavity size was normal in size. There is  no left ventricular hypertrophy. Left  ventricular diastolic parameters were normal. Right Ventricle: The right ventricular size is normal. No increase in right ventricular wall thickness. Right ventricular systolic function is normal. Left Atrium: Left atrial size was normal in size. Right Atrium: Right atrial size was normal in size. Pericardium: Trivial pericardial effusion is present. Mitral Valve: The mitral valve is normal in structure. Trivial mitral valve regurgitation. No evidence of mitral valve stenosis. Tricuspid Valve: The tricuspid valve is normal in structure. Tricuspid valve regurgitation is mild . No evidence of tricuspid stenosis. Aortic Valve: The aortic valve is tricuspid. Aortic valve regurgitation is not visualized. No aortic stenosis is present. Pulmonic Valve: The pulmonic valve was grossly normal. Pulmonic valve regurgitation is trivial. Aorta: The aortic root, ascending aorta and aortic arch are all structurally normal, with no evidence of dilitation or obstruction. Venous: The inferior vena cava is normal in size with less than 50% respiratory variability, suggesting right atrial pressure of 8 mmHg. IAS/Shunts: No atrial level shunt detected by color flow Doppler. Additional Comments: There is a moderate pleural effusion in both left and right lateral regions.  LEFT VENTRICLE PLAX 2D LVIDd:         5.10 cm  Diastology LVIDs:         3.30 cm  LV e' lateral:   8.27 cm/s LV PW:         0.90 cm  LV E/e' lateral: 9.2 LV IVS:        0.70 cm  LV e' medial:    6.64 cm/s LVOT diam:     1.80 cm  LV E/e' medial:  11.5 LV SV:         30 LV SV Index:   15 LVOT Area:     2.54 cm  RIGHT VENTRICLE RV S prime:     14.80 cm/s TAPSE (M-mode): 2.4 cm LEFT ATRIUM           Index       RIGHT ATRIUM           Index LA diam:      3.00 cm 1.56 cm/m  RA Area:     13.80 cm LA Vol (A2C): 44.7 ml 23.21 ml/m RA Volume:   33.50 ml  17.39 ml/m LA Vol (A4C): 42.6 ml 22.12 ml/m  AORTIC VALVE LVOT Vmax:   77.70 cm/s LVOT Vmean:  49.700 cm/s LVOT VTI:     0.116 m  AORTA Ao Root diam: 3.00 cm MITRAL VALVE MV Area (PHT): 4.96 cm    SHUNTS MV Decel Time: 153 msec    Systemic VTI:  0.12 m MV E velocity: 76.30 cm/s  Systemic Diam: 1.80 cm MV A velocity: 78.80 cm/s MV E/A ratio:  0.97 Buford Dresser MD Electronically signed by Buford Dresser MD Signature Date/Time: 07/07/2020/5:02:22 PM    Final     Cardiac Studies   2D Echo 07/07/20  1. Left ventricular ejection fraction, by estimation, is 60 to 65%. The  left ventricle has normal function. The left ventricle has no regional  wall motion abnormalities. Left ventricular diastolic parameters were  normal.  2. Right ventricular systolic function is normal. The right ventricular  size is normal.  3. Moderate pleural effusion in both left and right lateral regions.  4. The mitral valve is normal in structure. Trivial mitral valve  regurgitation. No evidence of mitral stenosis.  5. The aortic valve is tricuspid. Aortic valve regurgitation is not  visualized. No aortic stenosis is present.  6. The inferior vena cava is normal in size with <50% respiratory  variability, suggesting right atrial pressure of 8 mmHg.   Conclusion(s)/Recommendation(s): Normal biventricular function without  evidence of hemodynamically significant valvular heart disease.   Patient Profile     87 y.o. female with history of paroxysmal atrial fibrillation (prior h/o life threatening bleeding on anticoagulation), stage IV papillary thyroid carcinoma s/p thyroidectomy, post-surgical hypothyroidism, colon CA s/p resection, non-Hodgkin's lymphoma, pancytopenia, HTN, anemia, arthritis, [place], iron deficiency anemia. She was admitted with n/v, diarrhea, abdominal pain shortly after starting new chemotherapy. She has been found to have new ascites on CT scan (too small to tap), pleural effusion s/p right thoracentesis (blood-tinged fluid, cytology pending), acute hypoxic respiratory failure, abnormal LFTs,  hypokalemia and acute metabolic encephalopathy with waxing/waning confusion. On 7/19 developed transient narrow complex tachycardia.  Assessment & Plan    1. Multiple medical issues which include nausea, vomiting, diarrhea following new initiation of chemotherapy with oncologic history as above, with ascites, pleural effusion s/p thoracentesis (cytology pending), acute hypoxic respiratory failure, abnormal LFTs, and acute metabolic encephalopathy - ongoing care by heme-onc and IM - cytology of pleural fluid returned with reactive mesothelial cells - CT angio 07/07/20 showed moderate right and mild-moderate left pleural effusions, right basilar atelectasis/consolidation and left basilar atelectasis with interlobular septal thickening - BNP only 400 with normal biventricular function and diastolic function so less likely that pleural effusions are primarily heart failure, but may benefit from trial of diuresis at discretion of primary team. Albumin remains low at 2.6 which may be driving fluid shifts along with IVF  2. Narrow complex tachycardia on 07/07/20 - appeared more c/w atrial tach vs atrial flutter (less typical for the latter). Spontaneously converted back to NSR without intervention. 2 brief runs of SVT this AM - mostly regular but slight irregularity, will review with MD. - continue diltiazem CD 180mg  nightly - add Lopressor 12.5mg  BID and titrate as needed - 2D echo reassuring - see below re: abnormal thyroid function which may be contributing  3. History of PAF - not on anticoagulation prior to admission per Dr. Tamala Julian due to  h/o life threatening bleeding while on Xarelto - continue monitoring on telemetry  4. Essential HTN - stable  5. Abnormal TSH - suppressed at 0.330. - recommend further evaluation per medical team, given implications with atrial arrhythmias  6. Depression - pharmD has alerted under communication that max recommended dose of Celexa for patients over 60 is  20mg /day due to risk of QT prolongation. - internal medicine team should review and adjust if appropriate, since patient is also on other QT prolonging agents such as azithromycin and PRN medications - QTc 481ms on tele - recommend to keep K 4.0 or greater and Mg 2.0 or greater - will give KCl 42meq x 1 now, add Mg to labs, and defer further lyte management per IM  For questions or updates, please contact [place] Please consult [email or web address] for contact info under Cardiology/STEMI.  Signed, Charlie Pitter, PA-C 07/08/2020, 7:53 AM    Patient seen and examined with Melina Copa PA-C.  Agree as above, with the following exceptions and changes as noted below. She denies chest pain, says "my heart dose not hurt". Gen: NAD, CV: RRR, no murmurs, Lungs: diminshed laterally, Abd: soft, Extrem: Warm, well perfused, no edema, Neuro/Psych: alert and oriented x 3, normal mood and affect. All available labs, radiology testing, previous records reviewed. We discussed SOB likely from pleural effusions. IM will coordinate with IR, and trial of lasix. She is eager to have this done and go home. Maintaining sinus rhythm. Discussed with patient in detail, she is more oriented today and participates readily in conversation.   Elouise Munroe, MD 07/08/20 2:32 PM

## 2020-07-08 NOTE — Progress Notes (Signed)
PROGRESS NOTE   Tracy Mclaughlin  PPJ:[id number] DOB: 06/19/1934 DOA: 06/23/2020 PCP: [name], PA   Chef Complaints:  Nausea, vomiting and poor po intake.  Brief Narrative: 86 y.o.femalewith medical history significant forlow-grade lymphoma without significant response to chemotherapy last year and was recently started onBTK INHIBITORby Dr. [name], hypothyroidism, history of A. fib, pancytopenia presents to the ED with3-4 day history of nausea vomiting and diarrhea and abdominal pain-and right upper quadrant.symptomts started after new chemo on Thursday.Patient denies any fever chest pain.vomitted x3 today and had non bloody diarrhea 4 times today.  ED Course:Hemodynamically stable, afebrile, UA positive for UTI,CT scan abdomen done that showed free fluid with ascites and mesenteric edema, case was discussed Dr. [name] from oncology advised UTI treatment with Zosyn and he will see the patient.  Patient  Was admitted, being managed symptomatically chemo was held diarrhea improved subsequently oncology put the patient back on chemo overall tolerating well. Having nausea anorexia suspecting this is related to her lymphoma. Patient has been started on prednisone to help with appetite and also received trazodone for sleep. Patient remains deconditioned.  She was found to get more sleepy and confused subsequently trazodone and prednisone were discontinued with resolution of her confusion.  Patient was also noticed to have shortness of breath/dyspnea with activity-imaging studies showed pleural effusion underwent right-sided thoracentesis with 1.6 L of blood-tinged fluid removal, cytology reactive medically stable, no growth on culture. PT had suggested skilled nursing facility patient had PICC line SNF as well as home health.  Patient went into rapid tachycardia supraventricular 7/19 a.m. and also with hypoxia-underwent CTA chest no PE but right-sided pleural effusion  atelectasis/consolidation-cardiologist consulted.  Patient converted to sinus rhythm spontaneously. received few days of IV antibiotics , Procalcitonin has been negative/normal- no fever no leukocytosis so stopped  7/20.  Subjective: Seen this morning denies nausea vomiting abdominal pain.  Appears deconditioned and very frail. Remains on 2 L nasal cannula.  Mildly tachypneic in mid 20s  Assessment & Plan:  Intractable nausea vomitingdiarrhea andabdominal pain: Unclear etiology suspecting due to lymphoma.  Acalabrutinib was held on admission and resumed 7/8. Diarrhea improved.   Acalabrutinib  was again discontinued as her LFTs were worsening. Has new onset ascites on the CT scan-but too small to tap on the ultrasound . Cont supportive measure.  Prednisone was added to stimulate appetite but discontinued due to confusion  Pleural effusion: ? etiology ? due to lymphoma,? 2/2 Acalabrutinib ( less likely per oncology) .S/p rt thoracentesis 1.6 L blood-tinged fluid was removed.  Pleural fluid Gram stain no organism, culture no growth so far, cytology reactive mesothelial cell. CTA shows moderate right and mild to moderate left pleural effusions-BNP around 400 Echo shows preserved EF no diastolic dysfunction, unclear if related to CHF versus low Albumin.  Cardiology on board will start trial of IV Lasix low-dose, order US guided thoracentesis along with labs/cytology,discussed with cardio and  Oncology.  Acute hypoxic respiratory failure due to pleural effusion/no PE on the CT.  Will start diuresis.  Due to consolidation on the chest x-ray empiric antibiotics were started however procalcitonin has been normal patient is afebrile, low suspicion of pneumonia discontinue antibiotics.  Narrow complex tachycardia 07/07/20 am- pt moved to tele.  Converted to sinus rhythm.  On bedtime Cardizem already cardiology had seen the patient echocardiogram is stable, advised metoprolol if has recurrent episode.  Ate in  130s.  Patient was moved to telemetry following which she had spontaneous conversant to sinus rhythm in  the 80s.  Cardiology was consulted and notified.  Cardizem was held as she had episode of bradycardia 30s to 50s while on the way to telemetry.  Check TSH/bnp ., follow-up echocardiogram, follow-up cardiology recs. patient is on Cardizem qhs for history of intermittent atrial fibrillation  Ascites:new onset ascites on the CT scan-too small to tap on paracentesis per IR.    Abnormal LFTS: 2/2 Acalabrutinib-which is discontinued.  LFTs improving.RUQ [place]- possible cirrhotic changes, hepatitis panel negative.   Acute metabolic encephalopathy from polypharmacy: had  confusion/Lethargy: 7/14 a.m likley polypharmacy and pt feels improved after stopping trazodone/prednisone.  She is again requesting for sleeping medication, will need to caution on that.  She is on Xanax low-dose continue for anxiety.  Continue supportive care.    Hypokalemia.: resolved.   Recent Labs  Lab 07/04/20 0345 07/05/20 0437 07/06/20 0510 07/07/20 0544 07/08/20 0340  K 3.9 3.5 3.7 3.8 3.8   E coli UTI DPO:EUMPNTIRW 7 days of antibiotics.   Non Hodgkin's lymphoma-with progressive decline, patientwasstartedpast weekonAcalabrutinib she did not have significant response to chemotherapy last year.  Acalabrutinib was held on admission and resumed  But again stopped due to worsening LFTs by oncology.  Oncology is following, I discussed with him today.  Failure to thrive/deconditioning:Remains weak and deconditioned. She refused SNF.  She is high risk for readmission.  Husband concerned about poor support.  Hypertension: BP is controlled.  Continue Cardizem.    Pancytopenia with anemia/Thrombocytopenia in the setting of lymphoma/chemo: Platelet count is finally improving.  Hemoglobin and WBC count overall stable.  Monitor.Heparin was discontinued by hematology due to bilateral lower leg bruise and thrombocytopenia.     Recent Labs  Lab 07/06/20 0510 07/07/20 0544 07/08/20 0340  HGB 10.9* 11.5* 10.5*  HCT 33.8* 36.0 33.4*  WBC 3.2* 4.2 3.7*  PLT 87* 111* 117*   History of intermittent atrial fibrillation on Cardizem: Not on anticoagulation due to history of subarachnoid hemorrhage as per cardiology.  Bruise on the skin heparin discontinued per oncology. improving.  DVT prophylaxis: Place and maintain sequential compression device Start: 06/30/20 0901 Heparin stopped due to  extensive LE bruise 7/12 and low platelet counts by hematology. Code Status: DNR Family Communication: plan of care discussed with patient at bedside.I called and updated patient's husband yesterday.He is anxious and apprehensive that when she returns home he will not be able to have much support at home.   Status is: Inpatient Remains inpatient appropriate because: Patient remains deconditioned weak, hypoxic.  Dispo: The patient is from: Home               Anticipated d/c is to: SNF as per PT- pt declines SNF and HH.  Continued to engage on placement receomendations.              Anticipated d/c date is: 2 days.  Once shortness of better, and cleared by her oncologist.  I discussed with her oncology today.              Patient currently not medically stable.  Nutrition: Diet Order            Diet regular Room service appropriate? Yes; Fluid consistency: Thin  Diet effective now                Body mass index is 26.46 kg/m.  Consultants:see note  Procedures:see note Microbiology:see note  Medications: Scheduled Meds: . Chlorhexidine Gluconate Cloth  6 each Topical Daily  . [START ON 07/09/2020] citalopram  20 mg  Oral Daily  . diltiazem  180 mg Oral QHS  . diltiazem  10 mg Intravenous Once  . furosemide  20 mg Intravenous BID  . heparin lock flush  500 Units Intracatheter Q30 days  . levothyroxine  224 mcg Oral Q0600  . melatonin  3 mg Oral QHS  . metoprolol tartrate  12.5 mg Oral BID  . pantoprazole  40 mg  Oral Daily   Continuous Infusions: . sodium chloride 10 mL/hr at 07/07/20 0945    Antimicrobials: Anti-infectives (From admission, onward)   Start     Dose/Rate Route Frequency Ordered Stop   07/06/20 1800  cefTRIAXone (ROCEPHIN) 1 g in sodium chloride 0.9 % 100 mL IVPB  Status:  Discontinued        1 g 200 mL/hr over 30 Minutes Intravenous Every 24 hours 07/06/20 1646 07/08/20 1037   07/06/20 1800  azithromycin (ZITHROMAX) tablet 500 mg  Status:  Discontinued        500 mg Oral Daily 07/06/20 1646 07/08/20 1037   06/27/20 0830  cefTRIAXone (ROCEPHIN) 1 g in sodium chloride 0.9 % 100 mL IVPB  Status:  Discontinued        1 g 200 mL/hr over 30 Minutes Intravenous Every 24 hours 06/27/20 0733 06/30/20 1340   06/24/20 0200  piperacillin-tazobactam (ZOSYN) IVPB 3.375 g  Status:  Discontinued        3.375 g 12.5 mL/hr over 240 Minutes Intravenous Every 8 hours 06/23/20 1703 06/27/20 0733   06/23/20 1645  piperacillin-tazobactam (ZOSYN) IVPB 3.375 g        3.375 g 100 mL/hr over 30 Minutes Intravenous  Once 06/23/20 1638 06/23/20 1834   06/23/20 1615  cefTRIAXone (ROCEPHIN) 1 g in sodium chloride 0.9 % 100 mL IVPB  Status:  Discontinued        1 g 200 mL/hr over 30 Minutes Intravenous  Once 06/23/20 1607 06/23/20 1608       Objective: Vitals: Today's Vitals   07/08/20 0127 07/08/20 0500 07/08/20 0750 07/08/20 1242  BP: (!) 126/52 (!) 133/48 (!) 146/54 137/63  Pulse: 75 71 80 80  Resp: (!) 21 (!) 33 (!) 24 (!) 24  Temp:  97.8 F (36.6 C) 98.3 F (36.8 C)   TempSrc:  Axillary Oral Oral  SpO2: 97% 99% 96% 95%  Weight:      Height:      PainSc:   0-No pain     Intake/Output Summary (Last 24 hours) at 07/08/2020 1400 Last data filed at 07/08/2020 1017 Gross per 24 hour  Intake 400 ml  Output --  Net 400 ml   Filed Weights   06/23/20 1206  Weight: 78.9 kg   Weight change:    Intake/Output from previous day: 07/19 0701 - 07/20 0700 In: 232.2 [I.V.:232.2] Out: -   Intake/Output this shift: Total I/O In: 240 [P.O.:240] Out: -   Examination: General exam: AAOx3, weak, frail NAD, weak appearing. HEENT:Oral mucosa moist, Ear/Nose WNL grossly, dentition normal. Respiratory system: bilaterally diminished breath sounds on the right lung,no wheezing or crackles,no use of accessory muscle Cardiovascular system: S1 & S2 +, No JVD. Gastrointestinal system: Abdomen soft, NT,ND, BS+ Nervous System:Alert, awake, moving extremities and grossly nonfocal Extremities: No edema in ankle, distal peripheral pulses palpable.  Skin: No rashes,no icterus. MSK: Normal muscle bulk,tone, power   Data Reviewed: I have personally reviewed following labs and imaging studies CBC: Recent Labs  Lab 07/04/20 0345 07/05/20 0437 07/06/20 0510 07/07/20 0544 07/08/20 0340  WBC 4.6  3.5* 3.2* 4.2 3.7*  HGB 11.2* 10.1* 10.9* 11.5* 10.5*  HCT 34.7* 31.6* 33.8* 36.0 33.4*  MCV 107.4* 107.5* 107.6* 107.1* 108.1*  PLT 77* 73* 87* 111* 654*   Basic Metabolic Panel: Recent Labs  Lab 07/04/20 0345 07/05/20 0437 07/06/20 0510 07/07/20 0544 07/08/20 0340  NA 137 135 140 141 143  K 3.9 3.5 3.7 3.8 3.8  CL 103 102 106 105 106  CO2 27 27 28 26 30   GLUCOSE 101* 96 114* 107* 109*  BUN 21 14 9 9 9   CREATININE 0.74 0.57 0.53 0.50 0.50  CALCIUM 7.0* 6.8* 7.0* 7.3* 7.3*  MG  --   --   --   --  1.7   GFR: Estimated Creatinine Clearance: 58.8 mL/min (by C-G formula based on SCr of 0.5 mg/dL). Liver Function Tests: Recent Labs  Lab 07/04/20 0345 07/05/20 0437 07/06/20 0510 07/07/20 0544 07/08/20 0340  AST 130* 81* 64* 52* 37  ALT 59* 41 35 31 26  ALKPHOS 175* 164* 166* 161* 156*  BILITOT 0.8 0.9 0.9 0.8 0.7  PROT 5.4* 4.7* 5.0* 5.1* 4.8*  ALBUMIN 2.8* 2.4* 2.5* 2.6* 2.6*   No results for input(s): LIPASE, AMYLASE in the last 168 hours. Recent Labs  Lab 07/02/20 1623  AMMONIA 26   Coagulation Profile: No results for input(s): INR, PROTIME in the last 168  hours. Cardiac Enzymes: No results for input(s): CKTOTAL, CKMB, CKMBINDEX, TROPONINI in the last 168 hours. BNP (last 3 results) No results for input(s): PROBNP in the last 8760 hours. HbA1C: No results for input(s): HGBA1C in the last 72 hours. CBG: Recent Labs  Lab 07/08/20 0754 07/08/20 0824 07/08/20 1205  GLUCAP 108* 106* 115*   Lipid Profile: No results for input(s): CHOL, HDL, LDLCALC, TRIG, CHOLHDL, LDLDIRECT in the last 72 hours. Thyroid Function Tests: Recent Labs    07/08/20 0340  TSH 0.330*   Anemia Panel: No results for input(s): VITAMINB12, FOLATE, FERRITIN, TIBC, IRON, RETICCTPCT in the last 72 hours. Sepsis Labs: Recent Labs  Lab 07/06/20 1703 07/07/20 0544 07/08/20 0340  PROCALCITON <0.10 <0.10 <0.10    Recent Results (from the past 240 hour(s))  Body fluid culture     Status: None   Collection Time: 07/04/20 11:12 AM   Specimen: Lung, Right; Pleural Fluid  Result Value Ref Range Status   Specimen Description   Final    PLEURAL Performed at [facility], [place] [address], [place], [place] [address]    Special Requests   Final    NONE Performed at [facility], [place] [address] [place], [place] [address]    Gram Stain   Final    FEW WBC PRESENT, PREDOMINANTLY MONONUCLEAR NO ORGANISMS SEEN    Culture   Final    NO GROWTH 3 DAYS Performed at [place] [address], [place],  [address]    Report Status 07/07/2020 FINAL  Final      Radiology Studies: CT ANGIO CHEST PE W OR WO CONTRAST  Result Date: 07/07/2020 CLINICAL DATA:  Lymphoma, recurrent right pleural effusion bloody tap EXAM: CT ANGIOGRAPHY CHEST WITH CONTRAST TECHNIQUE: Multidetector CT imaging of the chest was performed using the standard protocol during bolus administration of intravenous contrast. Multiplanar CT image reconstructions and MIPs were obtained to evaluate the vascular anatomy. CONTRAST:  137mL OMNIPAQUE IOHEXOL 350  MG/ML SOLN COMPARISON:  None. FINDINGS: Cardiovascular: Aortic atherosclerosis. Normal heart size. Central pulmonary arteries are patent. Mediastinum/Nodes: Adenopathy is identified in  the supraclavicular region, bilateral axilla, and mediastinum. Nodes appear and measures slightly smaller in size. Lungs/Pleura: Moderate right and mild to moderate left pleural effusions measuring simple fluid in density. Imaging performed during expiration. Persistent atelectasis at the left lung base. New atelectasis/consolidation at the right lung base. Interlobular septal thickening the non dependent right lung may reflect asymmetric edema. Upper Abdomen: Ascites. Unchanged ill-defined hypoattenuating lesion in the superior spleen. Musculoskeletal: No new abnormality. Review of the MIP images confirms the above findings. IMPRESSION: Moderate right and mild to moderate left pleural effusions. Right basilar atelectasis/consolidation and left basilar atelectasis. Interlobular septal thickening in the right lung may reflect asymmetric edema. Multifocal adenopathy appears stable to slightly improved. Electronically Signed   By: Macy Mis M.D.   On: 07/07/2020 14:33   ECHOCARDIOGRAM COMPLETE  Result Date: 07/07/2020    ECHOCARDIOGRAM REPORT   Patient Name:   ANELLA NAKATA Date of Exam: 07/07/2020 Medical Rec #:  [id number]            Height:       68.0 in Accession #:    [id number]           Weight:       174.0 lb Date of Birth:  Dec 28, 1933           BSA:          1.926 m Patient Age:    25 years             BP:           132/65 mmHg Patient Gender: F                    HR:           855 bpm. Exam Location:  Inpatient Procedure: 2D Echo Indications:    Atrial tachycardia [facility]) [[id number]]  History:        Patient has prior history of Echocardiogram examinations, most                 recent 05/31/2019. Risk Factors:Hypertension. Lymphoma , thyroid                 disease, thyroid cancer. Past history of afib.  Sonographer:     Darlina Sicilian RDCS Referring Phys: [place]  1. Left ventricular ejection fraction, by estimation, is 60 to 65%. The left ventricle has normal function. The left ventricle has no regional wall motion abnormalities. Left ventricular diastolic parameters were normal.  2. Right ventricular systolic function is normal. The right ventricular size is normal.  3. Moderate pleural effusion in both left and right lateral regions.  4. The mitral valve is normal in structure. Trivial mitral valve regurgitation. No evidence of mitral stenosis.  5. The aortic valve is tricuspid. Aortic valve regurgitation is not visualized. No aortic stenosis is present.  6. The inferior vena cava is normal in size with <50% respiratory variability, suggesting right atrial pressure of 8 mmHg. Conclusion(s)/Recommendation(s): Normal biventricular function without evidence of hemodynamically significant valvular heart disease. FINDINGS  Left Ventricle: Left ventricular ejection fraction, by estimation, is 60 to 65%. The left ventricle has normal function. The left ventricle has no regional wall motion abnormalities. The left ventricular internal cavity size was normal in size. There is  no left ventricular hypertrophy. Left ventricular diastolic parameters were normal. Right Ventricle: The right ventricular size is normal. No increase in right ventricular wall thickness. Right ventricular systolic function is normal. Left Atrium:  Left atrial size was normal in size. Right Atrium: Right atrial size was normal in size. Pericardium: Trivial pericardial effusion is present. Mitral Valve: The mitral valve is normal in structure. Trivial mitral valve regurgitation. No evidence of mitral valve stenosis. Tricuspid Valve: The tricuspid valve is normal in structure. Tricuspid valve regurgitation is mild . No evidence of tricuspid stenosis. Aortic Valve: The aortic valve is tricuspid. Aortic valve regurgitation is not visualized. No  aortic stenosis is present. Pulmonic Valve: The pulmonic valve was grossly normal. Pulmonic valve regurgitation is trivial. Aorta: The aortic root, ascending aorta and aortic arch are all structurally normal, with no evidence of dilitation or obstruction. Venous: The inferior vena cava is normal in size with less than 50% respiratory variability, suggesting right atrial pressure of 8 mmHg. IAS/Shunts: No atrial level shunt detected by color flow Doppler. Additional Comments: There is a moderate pleural effusion in both left and right lateral regions.  LEFT VENTRICLE PLAX 2D LVIDd:         5.10 cm  Diastology LVIDs:         3.30 cm  LV e' lateral:   8.27 cm/s LV PW:         0.90 cm  LV E/e' lateral: 9.2 LV IVS:        0.70 cm  LV e' medial:    6.64 cm/s LVOT diam:     1.80 cm  LV E/e' medial:  11.5 LV SV:         30 LV SV Index:   15 LVOT Area:     2.54 cm  RIGHT VENTRICLE RV S prime:     14.80 cm/s TAPSE (M-mode): 2.4 cm LEFT ATRIUM           Index       RIGHT ATRIUM           Index LA diam:      3.00 cm 1.56 cm/m  RA Area:     13.80 cm LA Vol (A2C): 44.7 ml 23.21 ml/m RA Volume:   33.50 ml  17.39 ml/m LA Vol (A4C): 42.6 ml 22.12 ml/m  AORTIC VALVE LVOT Vmax:   77.70 cm/s LVOT Vmean:  49.700 cm/s LVOT VTI:    0.116 m  AORTA Ao Root diam: 3.00 cm MITRAL VALVE MV Area (PHT): 4.96 cm    SHUNTS MV Decel Time: 153 msec    Systemic VTI:  0.12 m MV E velocity: 76.30 cm/s  Systemic Diam: 1.80 cm MV A velocity: 78.80 cm/s MV E/A ratio:  0.97 Buford Dresser MD Electronically signed by Buford Dresser MD Signature Date/Time: 07/07/2020/5:02:22 PM    Final      LOS: 14 days   Antonieta Pert, MD [facility]  07/08/2020, 2:00 PM

## 2020-07-08 NOTE — Plan of Care (Signed)
  Problem: Education: Goal: Knowledge of General Education information will improve Description Including pain rating scale, medication(s)/side effects and non-pharmacologic comfort measures Outcome: Progressing   

## 2020-07-08 NOTE — Progress Notes (Signed)
Physical Therapy Treatment Patient Details Name: Tracy Mclaughlin MRN: [id number] DOB: 08/14/1934 Today's Date: 07/08/2020    History of Present Illness 86 yo female admitted with N/V/D, abd pain, UTI, ascites. Hx of NHL, hypothyroidism, A fib, anemia, OA, PE, SAH    PT Comments    Mod encouragement for participation. Pt agreed to sit up at EOB. She c/o some lightheadedness/dizziness. Attempted sit to stand but pt was unable on today. Assisted pt back to bed at her request. O2 sat 88% on 3L [place] O2 with activity. Dyspnea 2/4. Pt continues to get weaker. At this time, recommendation is for SNF rehab. Pt has declined SNF and HHPT f/u this admission. Will continue to follow.     Follow Up Recommendations  SNF;Supervision/Assistance - 24 hour     Equipment Recommendations   (may need WC)    Recommendations for Other Services       Precautions / Restrictions Precautions Precautions: Fall Precaution Comments: monitor O2,HR Restrictions Weight Bearing Restrictions: No    Mobility  Bed Mobility Overal bed mobility: Needs Assistance Bed Mobility: Rolling;Supine to Sit;Sit to Supine Rolling: Min guard   Supine to sit: Mod assist Sit to supine: Min assist;HOB elevated   General bed mobility comments: Assist for trunk, LEs, and to scoot. Increased time. Cues for technique.  Transfers Overall transfer level: Needs assistance Equipment used: Rolling walker (2 wheeled) Transfers: Sit to/from Stand Sit to Stand: Max assist         General transfer comment: Attempted sit to stand x 1-unable with +1 assist on today.  Ambulation/Gait                 Stairs             Wheelchair Mobility    Modified Rankin (Stroke Patients Only)       Balance Overall balance assessment: Needs assistance Sitting-balance support: Bilateral upper extremity supported Sitting balance-Leahy Scale: Good       Standing balance-Leahy Scale: Poor                               Cognition Arousal/Alertness: Awake/alert Behavior During Therapy: WFL for tasks assessed/performed Overall Cognitive Status: Within Functional Limits for tasks assessed                                        Exercises      General Comments        Pertinent Vitals/Pain Pain Assessment: No/denies pain    Home Living                      Prior Function            PT Goals (current goals can now be found in the care plan section) Progress towards PT goals: Not progressing toward goals - comment    Frequency    Min 3X/week      PT Plan Discharge plan needs to be updated    Co-evaluation              AM-PAC PT "6 Clicks" Mobility   Outcome Measure  Help needed turning from your back to your side while in a flat bed without using bedrails?: A Little Help needed moving from lying on your back to sitting on the side of a flat bed without using  bedrails?: A Lot Help needed moving to and from a bed to a chair (including a wheelchair)?: A Lot Help needed standing up from a chair using your arms (e.g., wheelchair or bedside chair)?: A Lot Help needed to walk in hospital room?: A Lot Help needed climbing 3-5 steps with a railing? : Total 6 Click Score: 12    End of Session Equipment Utilized During Treatment: Oxygen Activity Tolerance: Patient limited by fatigue Patient left: in bed;with call bell/phone within reach;with bed alarm set   PT Visit Diagnosis: Muscle weakness (generalized) (M62.81);Difficulty in walking, not elsewhere classified (R26.2);Unsteadiness on feet (R26.81)     Time: 7357-8978 PT Time Calculation (min) (ACUTE ONLY): 16 min  Charges:  $Therapeutic Activity: 8-22 mins                        Doreatha Massed, PT Acute Rehabilitation  Office: [phone] Pager: [phone]

## 2020-07-08 NOTE — Progress Notes (Signed)
Report received from Almon Hercules.  No change in assessment.  Pt denies any needs at this time. Will continue plan of care. Stasia Somero, Laurel Dimmer, RN

## 2020-07-08 NOTE — Progress Notes (Addendum)
HEMATOLOGY-ONCOLOGY PROGRESS NOTE  SUBJECTIVE: Reports ongoing shortness of breath.  No other complaints this morning.  Oncology History  Non Hodgkin's lymphoma ([place])  09/29/2016 Initial Diagnosis   Non Hodgkin's lymphoma ([place])   03/22/2019 - 04/23/2019 Chemotherapy   The patient had palonosetron (ALOXI) injection 0.25 mg, 0.25 mg, Intravenous,  Once, 1 of 4 cycles Administration: 0.25 mg (03/22/2019) riTUXimab (RITUXAN) 800 mg in sodium chloride 0.9 % 250 mL (2.4242 mg/mL) infusion, 375 mg/m2 = 800 mg, Intravenous,  Once, 1 of 4 cycles Administration: 800 mg (03/22/2019) bendamustine (BENDEKA) 150 mg in sodium chloride 0.9 % 50 mL (2.6786 mg/mL) chemo infusion, 70 mg/m2 = 150 mg (100 % of original dose 70 mg/m2), Intravenous,  Once, 1 of 4 cycles Dose modification: 70 mg/m2 (original dose 70 mg/m2, Cycle 1, Reason: Provider Judgment) Administration: 150 mg (03/22/2019), 150 mg (03/23/2019)  for chemotherapy treatment.    05/18/2019 - 06/28/2019 Chemotherapy   The patient had DOXOrubicin (ADRIAMYCIN) chemo injection 82 mg, 40 mg/m2 = 82 mg (100 % of original dose 40 mg/m2), Intravenous,  Once, 2 of 6 cycles Dose modification: 40 mg/m2 (original dose 40 mg/m2, Cycle 1, Reason: Provider Judgment) Administration: 82 mg (05/18/2019), 82 mg (06/08/2019) palonosetron (ALOXI) injection 0.25 mg, 0.25 mg, Intravenous,  Once, 2 of 6 cycles Administration: 0.25 mg (05/18/2019), 0.25 mg (06/08/2019) pegfilgrastim (NEULASTA ONPRO KIT) injection 6 mg, 6 mg, Subcutaneous, Once, 2 of 6 cycles Administration: 6 mg (05/18/2019), 6 mg (06/08/2019) vinCRIStine (ONCOVIN) 1 mg in sodium chloride 0.9 % 50 mL chemo infusion, 1 mg (100 % of original dose 1 mg), Intravenous,  Once, 2 of 6 cycles Dose modification: 1 mg (original dose 1 mg, Cycle 1, Reason: Provider Judgment) Administration: 1 mg (05/18/2019), 1 mg (06/08/2019) riTUXimab (RITUXAN) 800 mg in sodium chloride 0.9 % 250 mL (2.4242 mg/mL) infusion, 375 mg/m2 = 800 mg,  Intravenous,  Once, 2 of 6 cycles Administration: 800 mg (05/18/2019), 800 mg (06/08/2019) cyclophosphamide (CYTOXAN) 1,240 mg in sodium chloride 0.9 % 250 mL chemo infusion, 600 mg/m2 = 1,540 mg, Intravenous,  Once, 2 of 6 cycles Dose modification: 600 mg/m2 (original dose 750 mg/m2, Cycle 2, Reason: Provider Judgment) Administration: 1,240 mg (05/18/2019), 1,240 mg (06/08/2019) fosaprepitant (EMEND) 150 mg, dexamethasone (DECADRON) 12 mg in sodium chloride 0.9 % 145 mL IVPB, , Intravenous,  Once, 2 of 6 cycles Administration:  (05/18/2019),  (06/08/2019)  for chemotherapy treatment.    07/19/2019 -  Chemotherapy   The patient had riTUXimab (RITUXAN) 700 mg in sodium chloride 0.9 % 250 mL (2.1875 mg/mL) infusion, 375 mg/m2 = 700 mg, Intravenous,  Once, 11 of 11 cycles Administration: 700 mg (07/19/2019), 700 mg (08/16/2019), 700 mg (09/13/2019), 700 mg (10/10/2019), 700 mg (11/07/2019), 700 mg (12/07/2019), 700 mg (01/04/2020), 700 mg (02/01/2020), 700 mg (03/05/2020), 700 mg (04/02/2020), 700 mg (04/30/2020) riTUXimab-pvvr (RUXIENCE) 700 mg in sodium chloride 0.9 % 250 mL (2.1875 mg/mL) infusion, 375 mg/m2 = 700 mg (original dose ), Intravenous,  Once, 0 of 1 cycle Dose modification: 375 mg/m2 (Cycle 12)  for chemotherapy treatment.     PHYSICAL EXAMINATION:  Vitals:   07/08/20 0500 07/08/20 0750  BP: (!) 133/48 (!) 146/54  Pulse: 71 80  Resp: (!) 33 (!) 24  Temp: 97.8 F (36.6 C) 98.3 F (36.8 C)  SpO2: 99% 96%   Filed Weights   06/23/20 1206  Weight: 78.9 kg    Intake/Output from previous day: 07/19 0701 - 07/20 0700 In: 232.2 [I.V.:232.2] Out: -   GENERAL: Alert  HEENT: No thrush LYMPH: Soft mobile 1-2 cm left cervical and left axillary nodes LUNGS: Increased respiratory rate, decreased breath sounds throughout the right chest, bilateral expiratory wheezing HEART: Regular rate and rhythm ABDOMEN: Nontender, no mass, mildly distended SKIN: Multiple ecchymoses noted on her bilateral  arms, legs, and trunk NEURO: Alert, follows commands, moves all extremities, speaking appropriately  LABORATORY DATA:  I have reviewed the data as listed CMP Latest Ref Rng & Units 07/08/2020 07/07/2020 07/06/2020  Glucose 70 - 99 mg/dL 109(H) 107(H) 114(H)  BUN 8 - 23 mg/dL '9 9 9  ' Creatinine 0.44 - 1.00 mg/dL 0.50 0.50 0.53  Sodium 135 - 145 mmol/L 143 141 140  Potassium 3.5 - 5.1 mmol/L 3.8 3.8 3.7  Chloride 98 - 111 mmol/L 106 105 106  CO2 22 - 32 mmol/L '30 26 28  ' Calcium 8.9 - 10.3 mg/dL 7.3(L) 7.3(L) 7.0(L)  Total Protein 6.5 - 8.1 g/dL 4.8(L) 5.1(L) 5.0(L)  Total Bilirubin 0.3 - 1.2 mg/dL 0.7 0.8 0.9  Alkaline Phos 38 - 126 U/L 156(H) 161(H) 166(H)  AST 15 - 41 U/L 37 52(H) 64(H)  ALT 0 - 44 U/L 26 31 35    Lab Results  Component Value Date   WBC 3.7 (L) 07/08/2020   HGB 10.5 (L) 07/08/2020   HCT 33.4 (L) 07/08/2020   MCV 108.1 (H) 07/08/2020   PLT 117 (L) 07/08/2020   NEUTROABS 2.8 06/23/2020    DG Chest 1 View  Result Date: 07/04/2020 CLINICAL DATA:  Status post right thoracentesis. EXAM: CHEST  1 VIEW COMPARISON:  Chest x-ray from yesterday. FINDINGS: Unchanged right chest wall port catheter. The heart size and mediastinal contours are within normal limits. Normal pulmonary vascularity. Trace residual right pleural effusion status post thoracentesis. Improved aeration at the right lung base. No pneumothorax. Unchanged small left pleural effusion and basilar atelectasis. No acute osseous abnormality. IMPRESSION: 1. Trace residual right pleural effusion status post thoracentesis. No pneumothorax. 2. Unchanged small left pleural effusion and basilar atelectasis. Electronically Signed   By: Titus Dubin M.D.   On: 07/04/2020 11:42   DG Chest 2 View  Result Date: 07/06/2020 CLINICAL DATA:  Worsening shortness of breath. EXAM: CHEST - 2 VIEW COMPARISON:  07/04/2020 FINDINGS: The cardiac silhouette remains grossly normal in size, spur partially obscured by significantly  increased patchy opacity in the right lower lung zone. Small amount of posterior pleural fluid. Mild linear density at the left lung base. Right subclavian porta catheter tip in the region of the superior cavoatrial junction. Unremarkable bones. IMPRESSION: 1. Significantly increased right lower lung zone pneumonia. 2. Small amount of posterior pleural fluid. 3. Mild left basilar atelectasis. Electronically Signed   By: Claudie Revering M.D.   On: 07/06/2020 16:24   DG Chest 2 View  Result Date: 07/03/2020 CLINICAL DATA:  Dyspnea, pleural effusions EXAM: CHEST - 2 VIEW COMPARISON:  06/26/2020 FINDINGS: Frontal and lateral views of the chest demonstrates stable right chest wall port. Cardiac silhouette is unremarkable. There is now a moderate right pleural effusion with right basilar consolidation. Decreased left pleural effusion, with only trace fluid at the left costophrenic angle. Persistent retrocardiac consolidation. No pneumothorax. No acute bony abnormalities. IMPRESSION: 1. Decreased left pleural effusion, with persistent left basilar consolidation. 2. Increasing right pleural effusion, with increasing right basilar consolidation. Electronically Signed   By: Randa Ngo M.D.   On: 07/03/2020 16:11   DG Chest 2 View  Result Date: 06/26/2020 CLINICAL DATA:  Dyspnea. EXAM: CHEST - 2 VIEW  COMPARISON:  Chest CT 05/22/2020 FINDINGS: Right chest port remains in place. Hazy opacity at the left lung base consistent with pleural effusion and airspace disease/atelectasis. Right lung is clear. Upper normal heart size. Mild right hilar prominence likely secondary to combination of vascular structures and adenopathy is seen on recent CT. Fullness in the upper mediastinum likely secondary to adenopathy. No pneumothorax. No pulmonary edema. Surgical clips in the left axilla. IMPRESSION: 1. Hazy opacity at the left lung base consistent with pleural effusion and airspace disease/atelectasis. 2. Mild right hilar  prominence likely secondary to combination of vascular structures and adenopathy seen on recent CT. Electronically Signed   By: Keith Rake M.D.   On: 06/26/2020 15:54   CT ANGIO CHEST PE W OR WO CONTRAST  Result Date: 07/07/2020 CLINICAL DATA:  Lymphoma, recurrent right pleural effusion bloody tap EXAM: CT ANGIOGRAPHY CHEST WITH CONTRAST TECHNIQUE: Multidetector CT imaging of the chest was performed using the standard protocol during bolus administration of intravenous contrast. Multiplanar CT image reconstructions and MIPs were obtained to evaluate the vascular anatomy. CONTRAST:  142m OMNIPAQUE IOHEXOL 350 MG/ML SOLN COMPARISON:  None. FINDINGS: Cardiovascular: Aortic atherosclerosis. Normal heart size. Central pulmonary arteries are patent. Mediastinum/Nodes: Adenopathy is identified in the supraclavicular region, bilateral axilla, and mediastinum. Nodes appear and measures slightly smaller in size. Lungs/Pleura: Moderate right and mild to moderate left pleural effusions measuring simple fluid in density. Imaging performed during expiration. Persistent atelectasis at the left lung base. New atelectasis/consolidation at the right lung base. Interlobular septal thickening the non dependent right lung may reflect asymmetric edema. Upper Abdomen: Ascites. Unchanged ill-defined hypoattenuating lesion in the superior spleen. Musculoskeletal: No new abnormality. Review of the MIP images confirms the above findings. IMPRESSION: Moderate right and mild to moderate left pleural effusions. Right basilar atelectasis/consolidation and left basilar atelectasis. Interlobular septal thickening in the right lung may reflect asymmetric edema. Multifocal adenopathy appears stable to slightly improved. Electronically Signed   By: PMacy MisM.D.   On: 07/07/2020 14:33   CT Abdomen Pelvis W Contrast  Result Date: 06/23/2020 CLINICAL DATA:  86year old female with abdominal pain, nausea and vomiting. History of  lymphoma. EXAM: CT ABDOMEN AND PELVIS WITH CONTRAST TECHNIQUE: Multidetector CT imaging of the abdomen and pelvis was performed using the standard protocol following bolus administration of intravenous contrast. CONTRAST:  871mOMNIPAQUE IOHEXOL 300 MG/ML  SOLN COMPARISON:  CT of the chest abdomen pelvis dated 05/22/2020. FINDINGS: Lower chest: Partially visualized moderate left and trace right pleural effusions, new since the prior CT. There is associated partial compressive atelectasis of the left lower lobe. Pneumonia is not excluded. Clinical correlation is recommended. No intra-abdominal free air. Interval development of a small ascites, new or significantly increased since the prior CT. Hepatobiliary: A 1 cm hypodense focus along the posterior liver capsule is not characterized but may represent a cyst or focal area of scarring. This is similar to prior CT. Subcentimeter hypodense focus in the anterior liver is too small to characterize. There is mild intrahepatic biliary ductal dilatation, likely post cholecystectomy. The common bile duct is dilated measuring 14 mm. No retained calcified stone noted in the central CBD. Pancreas: The pancreas is unremarkable as visualized. Spleen: Mildly enlarged spleen measuring 15 cm in craniocaudal length. Adrenals/Urinary Tract: The adrenal glands are poorly visualized but grossly unremarkable. There is mild fullness of the renal collecting systems bilaterally without frank hydronephrosis. Mild hazy appearance of the urothelium noted. Correlation with urinalysis recommended to exclude UTI. There is symmetric enhancement  and excretion of contrast by both kidneys. Subcentimeter left renal hypodense focus is not characterized. The urinary bladder is partially distended. Probable chronic bladder wall thickening and perivesical stranding. Correlation with urinalysis recommended to exclude UTI. Stomach/Bowel: There is sigmoid diverticulosis without definite active inflammatory  changes. There is postsurgical changes of bowel with anastomotic suture in the right lower quadrant. No definite evidence of bowel obstruction. Evaluation however is limited in the absence of oral contrast. Vascular/Lymphatic: There is moderate aortoiliac atherosclerotic disease. The IVC is unremarkable as visualized. The SMV, splenic vein, and main portal vein are patent. No portal venous gas. Bulky retroperitoneal adenopathy encasing the abdominal aorta and IVC as well as enlarged bilateral iliac chain lymph nodes in keeping with history of lymphoma. Overall the degree of adenopathy is relatively similar to prior CT. Reproductive: The uterus is poorly visualized. Other: Diffuse mesenteric edema, new since the prior CT. There is scattered mesenteric adenopathy as seen previously. Musculoskeletal: Degenerative changes of the spine. Minimal compression fracture of the anterior superior and inferior endplates of the L3, new since the prior CT, likely acute or subacute. IMPRESSION: 1. Interval development of partially visualized moderate left and trace right pleural effusions, as well as development of diffuse mesenteric edema and ascites, new since the prior CT. 2. Bulky retroperitoneal and iliac chain adenopathy in keeping with history of lymphoma. Overall the degree of adenopathy is relatively similar to prior CT. 3. Mild splenomegaly. 4. Minimal compression fracture of the superior and inferior endplate of L3, likely acute or subacute. 5. Sigmoid diverticulosis. 6. Aortic Atherosclerosis (ICD10-I70.0). Electronically Signed   By: Anner Crete M.D.   On: 06/23/2020 16:39   ECHOCARDIOGRAM COMPLETE  Result Date: 07/07/2020    ECHOCARDIOGRAM REPORT   Patient Name:   Tracy Mclaughlin Date of Exam: 07/07/2020 Medical Rec #:  [id number]            Height:       68.0 in Accession #:    [id number]           Weight:       174.0 lb Date of Birth:  [birth year]           BSA:          1.926 m Patient Age:    86 years              BP:           132/65 mmHg Patient Gender: F                    HR:           855 bpm. Exam Location:  Inpatient Procedure: 2D Echo Indications:    Atrial tachycardia [facility]) [[id number]]  History:        Patient has prior history of Echocardiogram examinations, most                 recent 05/31/2019. Risk Factors:Hypertension. Lymphoma , thyroid                 disease, thyroid cancer. Past history of afib.  Sonographer:    Darlina Sicilian RDCS Referring Phys: [place]  1. Left ventricular ejection fraction, by estimation, is 60 to 65%. The left ventricle has normal function. The left ventricle has no regional wall motion abnormalities. Left ventricular diastolic parameters were normal.  2. Right ventricular systolic function is normal. The right ventricular size is normal.  3. Moderate pleural effusion in  both left and right lateral regions.  4. The mitral valve is normal in structure. Trivial mitral valve regurgitation. No evidence of mitral stenosis.  5. The aortic valve is tricuspid. Aortic valve regurgitation is not visualized. No aortic stenosis is present.  6. The inferior vena cava is normal in size with <50% respiratory variability, suggesting right atrial pressure of 8 mmHg. Conclusion(s)/Recommendation(s): Normal biventricular function without evidence of hemodynamically significant valvular heart disease. FINDINGS  Left Ventricle: Left ventricular ejection fraction, by estimation, is 60 to 65%. The left ventricle has normal function. The left ventricle has no regional wall motion abnormalities. The left ventricular internal cavity size was normal in size. There is  no left ventricular hypertrophy. Left ventricular diastolic parameters were normal. Right Ventricle: The right ventricular size is normal. No increase in right ventricular wall thickness. Right ventricular systolic function is normal. Left Atrium: Left atrial size was normal in size. Right Atrium: Right atrial size was  normal in size. Pericardium: Trivial pericardial effusion is present. Mitral Valve: The mitral valve is normal in structure. Trivial mitral valve regurgitation. No evidence of mitral valve stenosis. Tricuspid Valve: The tricuspid valve is normal in structure. Tricuspid valve regurgitation is mild . No evidence of tricuspid stenosis. Aortic Valve: The aortic valve is tricuspid. Aortic valve regurgitation is not visualized. No aortic stenosis is present. Pulmonic Valve: The pulmonic valve was grossly normal. Pulmonic valve regurgitation is trivial. Aorta: The aortic root, ascending aorta and aortic arch are all structurally normal, with no evidence of dilitation or obstruction. Venous: The inferior vena cava is normal in size with less than 50% respiratory variability, suggesting right atrial pressure of 8 mmHg. IAS/Shunts: No atrial level shunt detected by color flow Doppler. Additional Comments: There is a moderate pleural effusion in both left and right lateral regions.  LEFT VENTRICLE PLAX 2D LVIDd:         5.10 cm  Diastology LVIDs:         3.30 cm  LV e' lateral:   8.27 cm/s LV PW:         0.90 cm  LV E/e' lateral: 9.2 LV IVS:        0.70 cm  LV e' medial:    6.64 cm/s LVOT diam:     1.80 cm  LV E/e' medial:  11.5 LV SV:         30 LV SV Index:   15 LVOT Area:     2.54 cm  RIGHT VENTRICLE RV S prime:     14.80 cm/s TAPSE (M-mode): 2.4 cm LEFT ATRIUM           Index       RIGHT ATRIUM           Index LA diam:      3.00 cm 1.56 cm/m  RA Area:     13.80 cm LA Vol (A2C): 44.7 ml 23.21 ml/m RA Volume:   33.50 ml  17.39 ml/m LA Vol (A4C): 42.6 ml 22.12 ml/m  AORTIC VALVE LVOT Vmax:   77.70 cm/s LVOT Vmean:  49.700 cm/s LVOT VTI:    0.116 m  AORTA Ao Root diam: 3.00 cm MITRAL VALVE MV Area (PHT): 4.96 cm    SHUNTS MV Decel Time: 153 msec    Systemic VTI:  0.12 m MV E velocity: 76.30 cm/s  Systemic Diam: 1.80 cm MV A velocity: 78.80 cm/s MV E/A ratio:  0.97 Buford Dresser MD Electronically signed by  Buford Dresser MD Signature Date/Time: 07/07/2020/5:02:22 PM  Final    [place] ASCITES (ABDOMEN LIMITED)  Result Date: 07/01/2020 CLINICAL DATA:  History of lymphoma, now with concern for symptomatic intra-abdominal ascites. Please perform ascites search ultrasound and ultrasound-guided paracentesis as indicated. EXAM: LIMITED ABDOMEN ULTRASOUND FOR ASCITES TECHNIQUE: Limited ultrasound survey for ascites was performed in all four abdominal quadrants. COMPARISON:  CT abdomen pelvis-06/23/2020 FINDINGS: Sonographic evaluation demonstrates a trace amount of intra-abdominal ascites, too small to allow for safe ultrasound-guided paracentesis. Note is made of small bilateral pleural effusions, the right side of which appears new compared to abdominal CT performed 06/23/2020. IMPRESSION: 1. Trace amount of intra-abdominal ascites, too small to allow for safe ultrasound-guided paracentesis. No paracentesis attempted. 2. Small bilateral pleural effusions - the left-sided pleural effusion appears similar to abdominal CT performed 06/23/2020 however the right-sided pleural effusion appears new of the CT. Electronically Signed   By: Sandi Mariscal M.D.   On: 07/01/2020 15:11   US Abdomen Limited RUQ  Result Date: 07/02/2020 CLINICAL DATA:  Abnormal LFTs EXAM: ULTRASOUND ABDOMEN LIMITED RIGHT UPPER QUADRANT COMPARISON:  CT 06/23/2020.  Ultrasound 07/01/2020 FINDINGS: Gallbladder: Prior cholecystectomy Common bile duct: Diameter: Prominent measuring up to 10 mm, likely related to post cholecystectomy state. Liver: Heterogeneous, slightly increased echotexture. Subtle nodular contours to the liver surface. Findings raise the possibility of cirrhosis. No suspicious focal hepatic abnormality. Portal vein is patent on color Doppler imaging with normal direction of blood flow towards the liver. Other: Ascites and right effusion noted. Prominent porta hepatis lymph nodes. IMPRESSION: Heterogeneous, increased echotexture  throughout the liver. Subtle nodular contour seen in some areas of the liver. Findings suggest the possibility of cirrhosis. Perihepatic ascites and right pleural effusion. Mild biliary ductal dilatation, likely related to post cholecystectomy state. Prominent porta hepatis lymph nodes, likely related to liver disease. Electronically Signed   By: Rolm Baptise M.D.   On: 07/02/2020 08:52   US THORACENTESIS ASP PLEURAL SPACE W/IMG GUIDE  Result Date: 07/04/2020 INDICATION: Patient with history of lymphoma, remote colon and thyroid cancers, dyspnea, right pleural effusion. Request made for diagnostic and therapeutic right thoracentesis. EXAM: ULTRASOUND GUIDED DIAGNOSTIC AND THERAPEUTIC RIGHT THORACENTESIS MEDICATIONS: 1% lidocaine to skin and subcutaneous tissue COMPLICATIONS: None immediate. PROCEDURE: An ultrasound guided thoracentesis was thoroughly discussed with the patient and questions answered. The benefits, risks, alternatives and complications were also discussed. The patient understands and wishes to proceed with the procedure. Written consent was obtained. Ultrasound was performed to localize and mark an adequate pocket of fluid in the right chest. The area was then prepped and draped in the normal sterile fashion. 1% Lidocaine was used for local anesthesia. Under ultrasound guidance a 6 Fr Safe-T-Centesis catheter was introduced. Thoracentesis was performed. The catheter was removed and a dressing applied. FINDINGS: A total of approximately 1.6 liters of blood-tinged fluid was removed. Samples were sent to the laboratory as requested by the clinical team. IMPRESSION: Successful ultrasound guided diagnostic and therapeutic right thoracentesis yielding 1.6 liters of pleural fluid. Read by: Rowe Robert, PA-C Electronically Signed   By: Lucrezia Europe M.D.   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 Status post weekly Rituxan x4 03/01/2012 through 03/22/2012. She completed 4 "maintenance" doses of Rituxan, last on 12/19/2012. A restaging CT on 02/09/2013 showed no evidence of lymphoma.   Lymph node lateral to the thyroid bed on a neck ultrasound 02/21/2014, status post an FNA biopsy concerning  for a lymphoproliferative disorder.  PET scan 09/28/2016 with active lymphoma within the neck, chest, abdomen, pelvis; splenic enlargement and hypermetabolism suspicious for splenic involvement.  Initiation of Rituxan weekly 4 09/29/2016  Initiation of maintenance Rituxan on a 3 month schedule 12/23/2016; final Rituxan 08/31/2018  Thyroid ultrasound 02/07/2019-left cervical lymphadenopathy  PET scan 03/08/2019-extensive recurrent hypermetabolic lymphoma involving the neck, chest, abdomen and pelvis.  03/16/2019 left cervical lymph node biopsy-features consistent with previously diagnosed non-Hodgkin's B-cell lymphoma, phenotypically consistent with marginal zone lymphoma. Flow cytometry with lambda restricted B-cell population without expression of CD5 or CD10 comprising 87% of all lymphocytes.  Cycle 1 bendamustine/Rituxan 03/22/2019  Excision deep left axillary lymph nodes 05/04/2019-non-Hodgkin's B-cell lymphoma with differential including a marginal zone lymphoma and atypical small lymphocytic lymphoma. Flow cytometry with monoclonal B-cell population without expression of CD5 or CD10, comprises 96% of all lymphocytes.  Cycle 1 CHOP/Rituxan 05/18/2019  Cycle 2 [facility]OP/Rituxan 06/08/2019  CTs 06/15/2019-partial improvement in diffuse adenopathy, stable mild splenomegaly  Cycle 1 Revlimid/rituximab 07/19/2019 (Revlimid start 07/20/2019)  Cycle 2 Revlimid/rituximab 08/16/2019 (Revlimid placed on hold 08/30/2019 due to neutropenia)  Cycle 3 of Revlimid/rituximab 09/13/2019 (Revlimid schedule changed to 14 days on/14 days off)  Cycle 4  Revlimid/rituximab 10/10/2019  Cycle 5 Revlimid/rituximab 11/07/2019  Cycle 6 Revlimid/rituximab 12/07/2019  CTs 12/28/2019-diffuse lymphadenopathy-slightly increased compared to 06/15/2019  Cycle 7 Revlimid/rituximab 01/04/2020  Cycle 8 Revlimid/Rituxan 02/01/2020  Cycle 9 Revlimid/Rituxan 03/05/2020  Cycle 10 Revlimid/Rituxan 04/02/2020  Cycle 11 Revlimid/Rituxan 04/30/2020  CT 05/22/2020-mild increase in left supraclavicular adenopathy, mild increase in chest, retroperitoneal, and pelvic adenopathy. Stable mild splenomegaly.  Acalabrutinib started 06/19/2020  CT abdomen/pelvis 06/23/2020-moderate left and trace right pleural effusions, new diffuse mesenteric edema and ascites, stable retroperitoneal and iliac adenopathy, mild splenomegaly 2. Stage IV (T1bN1b M0) papillary thyroid cancer, status post a thyroidectomy with reimplantation of the left superior parathyroid gland on 05/23/2012, status post radioactive iodine therapy, followed by Dr. Buddy Duty.  3. Stage II (T3 N0) colon cancer, status post a right colectomy 10/19/2011, last colonoscopy April 2015-sigmoid adenoma removed.  4. History of a pulmonary embolism December 2012.  5. History of Atrial fibrillation 6. Iron deficiency anemia-new 03/18/2014. Hemoccult positive stool. The anemia corrected with iron. No longer taking iron.  Status post an upper endoscopy and colonoscopy by Dr. Carlean Purl April 2015 with no bleeding source identified, benign adenoma removed from the sigmoid colon. 7. Report of an upper gastric intestinal bleed fall 2016-managed in [place]. [name] 8. Left knee replacement May 2017, repeat left knee surgery May 2018 9. Pruritic rash 07/22/2016 10. Nausea and diarrhea 04/02/2019-stool negative for C. difficile toxin 11. 06/18/2019 [facility] admission for symptomatic anemia. 12.  Admission 06/23/2020 with nausea and diarrhea 13.  Respiratory failure, bilateral pleural effusions  Right thoracentesis 07/04/2020-culture and  cytology negative  CT chest 07/07/2020-right and left pleural effusions, bilateral basilar atelectasis, interlobular septal thickening in the right lung-asymmetric edema?  Improved adenopathyaca  Tracy Mclaughlin continues to have ongoing shortness of breath.  Echocardiogram performed on 07/07/2020 showed a normal left ventricular ejection fraction of 60 to 65%.  CTA chest performed on 07/07/2020 showed moderate right and mild to moderate left pleural effusions.  She has persistent mild pancytopenia.  AST has normalized today with a persistently mildly elevated alkaline phosphatase.  Acalabrutinib will remain on hold.  Recommendations: 1.  Continue to hold acalabrutinib 2.  Cardiology recommending a trial of diuresis, but pleural effusions thought to be less likely due to heart failure.   3.  Repeat thoracentesis  LOS: 14 days   Mikey Bussing 07/08/[name] was interviewed and examined.  She continues to have respiratory distress.  The chest CT yesterday confirmed bilateral effusions and possible pulmonary edema.  Pleural fluid cytology and culture are negative.  I doubt the chest fluid is related to acalabrutinib.  I agree with a trial of diuresis.  I also agree with a repeat thoracentesis for comfort.  Acalabrutinib remains on hold.  The chest CT confirmed improvement in lymphadenopathy.  Julieanne Manson, MD

## 2020-07-09 ENCOUNTER — Inpatient Hospital Stay (HOSPITAL_COMMUNITY): Payer: Medicare PPO

## 2020-07-09 DIAGNOSIS — I480 Paroxysmal atrial fibrillation: Secondary | ICD-10-CM

## 2020-07-09 DIAGNOSIS — D61818 Other pancytopenia: Secondary | ICD-10-CM

## 2020-07-09 DIAGNOSIS — R112 Nausea with vomiting, unspecified: Secondary | ICD-10-CM | POA: Diagnosis not present

## 2020-07-09 DIAGNOSIS — C8510 Unspecified B-cell lymphoma, unspecified site: Secondary | ICD-10-CM | POA: Diagnosis not present

## 2020-07-09 DIAGNOSIS — R180 Malignant ascites: Secondary | ICD-10-CM

## 2020-07-09 DIAGNOSIS — D649 Anemia, unspecified: Secondary | ICD-10-CM

## 2020-07-09 DIAGNOSIS — R0602 Shortness of breath: Secondary | ICD-10-CM | POA: Diagnosis not present

## 2020-07-09 DIAGNOSIS — N3000 Acute cystitis without hematuria: Secondary | ICD-10-CM

## 2020-07-09 LAB — LACTATE DEHYDROGENASE: LDH: 257 U/L — ABNORMAL HIGH (ref 98–192)

## 2020-07-09 LAB — LACTATE DEHYDROGENASE, PLEURAL OR PERITONEAL FLUID: LD, Fluid: 245 U/L — ABNORMAL HIGH (ref 3–23)

## 2020-07-09 LAB — BASIC METABOLIC PANEL
Anion gap: 6 (ref 5–15)
BUN: 10 mg/dL (ref 8–23)
CO2: 34 mmol/L — ABNORMAL HIGH (ref 22–32)
Calcium: 7.7 mg/dL — ABNORMAL LOW (ref 8.9–10.3)
Chloride: 101 mmol/L (ref 98–111)
Creatinine, Ser: 0.46 mg/dL (ref 0.44–1.00)
GFR calc Af Amer: >60 mL/min (ref >60)
GFR calc non Af Amer: >60 mL/min (ref >60)
Glucose, Bld: 120 mg/dL — ABNORMAL HIGH (ref 70–99)
Potassium: 3.9 mmol/L (ref 3.5–5.1)
Sodium: 141 mmol/L (ref 135–145)

## 2020-07-09 LAB — BRAIN NATRIURETIC PEPTIDE: B Natriuretic Peptide: 447.7 pg/mL — ABNORMAL HIGH (ref 0.0–100.0)

## 2020-07-09 LAB — CBC
HCT: 33.9 % — ABNORMAL LOW (ref 36.0–46.0)
Hemoglobin: 11 g/dL — ABNORMAL LOW (ref 12.0–15.0)
MCH: 34.2 pg — ABNORMAL HIGH (ref 26.0–34.0)
MCHC: 32.4 g/dL (ref 30.0–36.0)
MCV: 105.3 fL — ABNORMAL HIGH (ref 80.0–100.0)
Platelets: 130 10*3/uL — ABNORMAL LOW (ref 150–400)
RBC: 3.22 MIL/uL — ABNORMAL LOW (ref 3.87–5.11)
RDW: 14.6 % (ref 11.5–15.5)
WBC: 4.5 10*3/uL (ref 4.0–10.5)
nRBC: 0 % (ref 0.0–0.2)

## 2020-07-09 LAB — GLUCOSE, CAPILLARY
Glucose-Capillary: 103 mg/dL — ABNORMAL HIGH (ref 70–99)
Glucose-Capillary: 100 mg/dL — ABNORMAL HIGH (ref 70–99)

## 2020-07-09 LAB — GLUCOSE, PLEURAL OR PERITONEAL FLUID: Glucose, Fluid: 126 mg/dL

## 2020-07-09 LAB — AMYLASE, PLEURAL OR PERITONEAL FLUID: Amylase, Fluid: 45 U/L

## 2020-07-09 LAB — MAGNESIUM: Magnesium: 1.7 mg/dL (ref 1.7–2.4)

## 2020-07-09 LAB — PROCALCITONIN: Procalcitonin: <0.1 ng/mL

## 2020-07-09 MED ORDER — FUROSEMIDE 10 MG/ML IJ SOLN: 40.0000 mg | Freq: Two times a day (BID) | INTRAMUSCULAR | Status: DC

## 2020-07-09 MED ORDER — FUROSEMIDE 10 MG/ML IJ SOLN
40.0000 mg | Freq: Once | INTRAMUSCULAR | Status: AC
  Administered 2020-07-09: 40 mg via INTRAVENOUS
  Filled 2020-07-09: qty 4

## 2020-07-09 MED ORDER — FUROSEMIDE 10 MG/ML IJ SOLN
20.0000 mg | Freq: Two times a day (BID) | INTRAMUSCULAR | Status: DC
  Administered 2020-07-10 – 2020-07-22 (×25): 20 mg via INTRAVENOUS
  Filled 2020-07-09 (×27): qty 2

## 2020-07-09 NOTE — Progress Notes (Addendum)
Progress Note  Patient Name: Mayling Aber Date of Encounter: 07/09/2020  Primary Cardiologist: Sinclair Grooms, MD  Subjective   Feeling well today. No specific complaints. Very SOB with communication. Stable O2 saturations on supplemental oxygen  Inpatient Medications    Scheduled Meds:  Chlorhexidine Gluconate Cloth  6 each Topical Daily   citalopram  20 mg Oral Daily   diltiazem  180 mg Oral QHS   diltiazem  10 mg Intravenous Once   feeding supplement (KATE FARMS STANDARD 1.4)  325 mL Oral BID BM   feeding supplement (PRO-STAT SUGAR FREE 64)  30 mL Oral Daily   furosemide  20 mg Intravenous BID   heparin lock flush  500 Units Intracatheter Q30 days   levothyroxine  224 mcg Oral Q0600   melatonin  3 mg Oral QHS   metoprolol tartrate  12.5 mg Oral BID   multivitamin with minerals  1 tablet Oral Daily   pantoprazole  40 mg Oral Daily   Continuous Infusions:  sodium chloride 10 mL/hr at 07/07/20 0945   PRN Meds: sodium chloride, acetaminophen **OR** acetaminophen, albuterol, ALPRAZolam, alum & mag hydroxide-simeth, diphenhydrAMINE, heparin lock flush **AND** heparin lock flush, loperamide, ondansetron **OR** ondansetron (ZOFRAN) IV, sodium chloride flush   Vital Signs    Vitals:   07/08/20 2152 07/09/20 0529 07/09/20 0607 07/09/20 0725  BP:  (!) 150/59  121/66  Pulse:  81  75  Resp:  19  (!) 26  Temp:  97.9 F (36.6 C)  98.1 F (36.7 C)  TempSrc:  Oral  Oral  SpO2: 97% 92% 95% 95%  Weight:      Height:        Intake/Output Summary (Last 24 hours) at 07/09/2020 0749 Last data filed at 07/09/2020 0538 Gross per 24 hour  Intake 290 ml  Output 700 ml  Net -410 ml   Filed Weights   06/23/20 1206 07/08/20 1800  Weight: 78.9 kg 76.2 kg    Physical Exam   General: Ill appearing, NAD Neck: Negative for carotid bruits. No JVD Lungs: Diminished in bilateral LL. No wheezes. Breathing is labored. Cardiovascular: RRR with S1 S2. No  murmurs Abdomen: Soft, non-tender, non-distended. No obvious abdominal masses. Extremities: No edema. Radial pulses 2+ bilaterally Neuro: Alert and oriented. No focal deficits. No facial asymmetry. MAE spontaneously. Psych: Responds to questions appropriately with normal affect.    Labs    Chemistry Recent Labs  Lab 07/06/20 0510 07/06/20 0510 07/07/20 0544 07/08/20 0340 07/09/20 0537  NA 140   < > 141 143 141  K 3.7   < > 3.8 3.8 3.9  CL 106   < > 105 106 101  CO2 28   < > 26 30 34*  GLUCOSE 114*   < > 107* 109* 120*  BUN 9   < > 9 9 10   CREATININE 0.53   < > 0.50 0.50 0.46  CALCIUM 7.0*   < > 7.3* 7.3* 7.7*  PROT 5.0*  --  5.1* 4.8*  --   ALBUMIN 2.5*  --  2.6* 2.6*  --   AST 64*  --  52* 37  --   ALT 35  --  31 26  --   ALKPHOS 166*  --  161* 156*  --   BILITOT 0.9  --  0.8 0.7  --   GFRNONAA >60   < > >60 >60 >60  GFRAA >60   < > >60 >60 >60  ANIONGAP 6   < > 10 7 6    < > = values in this interval not displayed.     Hematology Recent Labs  Lab 07/07/20 0544 07/08/20 0340 07/09/20 0537  WBC 4.2 3.7* 4.5  RBC 3.36* 3.09* 3.22*  HGB 11.5* 10.5* 11.0*  HCT 36.0 33.4* 33.9*  MCV 107.1* 108.1* 105.3*  MCH 34.2* 34.0 34.2*  MCHC 31.9 31.4 32.4  RDW 15.0 15.3 14.6  PLT 111* 117* 130*    Cardiac EnzymesNo results for input(s): TROPONINI in the last 168 hours. No results for input(s): TROPIPOC in the last 168 hours.   BNP Recent Labs  Lab 07/07/20 1640  BNP 400.4*     DDimer No results for input(s): DDIMER in the last 168 hours.   Radiology    DG Chest 1 View  Result Date: 07/08/2020 CLINICAL DATA:  86 year old female status post right thoracentesis. EXAM: CHEST  1 VIEW COMPARISON:  Chest radiograph dated 07/06/2020. FINDINGS: Right-sided Port-A-Cath in similar position. Bilateral patchy airspace opacities, right greater left with interval progression compared to prior radiograph may represent edema or pneumonia. Clinical correlation is recommended. Small  bilateral pleural effusions. No pneumothorax. Stable cardiac silhouette. Atherosclerotic calcification of the aorta. No acute osseous pathology. IMPRESSION: 1. Small bilateral pleural effusions.  No pneumothorax. 2. Bilateral airspace opacities, progressed since the prior radiograph. Electronically Signed   By: Anner Crete M.D.   On: 07/08/2020 18:26   CT ANGIO CHEST PE W OR WO CONTRAST  Result Date: 07/07/2020 CLINICAL DATA:  Lymphoma, recurrent right pleural effusion bloody tap EXAM: CT ANGIOGRAPHY CHEST WITH CONTRAST TECHNIQUE: Multidetector CT imaging of the chest was performed using the standard protocol during bolus administration of intravenous contrast. Multiplanar CT image reconstructions and MIPs were obtained to evaluate the vascular anatomy. CONTRAST:  172mL OMNIPAQUE IOHEXOL 350 MG/ML SOLN COMPARISON:  None. FINDINGS: Cardiovascular: Aortic atherosclerosis. Normal heart size. Central pulmonary arteries are patent. Mediastinum/Nodes: Adenopathy is identified in the supraclavicular region, bilateral axilla, and mediastinum. Nodes appear and measures slightly smaller in size. Lungs/Pleura: Moderate right and mild to moderate left pleural effusions measuring simple fluid in density. Imaging performed during expiration. Persistent atelectasis at the left lung base. New atelectasis/consolidation at the right lung base. Interlobular septal thickening the non dependent right lung may reflect asymmetric edema. Upper Abdomen: Ascites. Unchanged ill-defined hypoattenuating lesion in the superior spleen. Musculoskeletal: No new abnormality. Review of the MIP images confirms the above findings. IMPRESSION: Moderate right and mild to moderate left pleural effusions. Right basilar atelectasis/consolidation and left basilar atelectasis. Interlobular septal thickening in the right lung may reflect asymmetric edema. Multifocal adenopathy appears stable to slightly improved. Electronically Signed   By: Macy Mis M.D.   On: 07/07/2020 14:33   ECHOCARDIOGRAM COMPLETE  Result Date: 07/07/2020    ECHOCARDIOGRAM REPORT   Patient Name:   PATRIC VANPELT Date of Exam: 07/07/2020 Medical Rec #:  [id number]            Height:       68.0 in Accession #:    [id number]           Weight:       174.0 lb Date of Birth:  06-16-1934           BSA:          1.926 m Patient Age:    36 years             BP:  132/65 mmHg Patient Gender: F                    HR:           855 bpm. Exam Location:  Inpatient Procedure: 2D Echo Indications:    Atrial tachycardia [facility]) [[id number]]  History:        Patient has prior history of Echocardiogram examinations, most                 recent 05/31/2019. Risk Factors:Hypertension. Lymphoma , thyroid                 disease, thyroid cancer. Past history of afib.  Sonographer:    Darlina Sicilian RDCS Referring Phys: [place]  1. Left ventricular ejection fraction, by estimation, is 60 to 65%. The left ventricle has normal function. The left ventricle has no regional wall motion abnormalities. Left ventricular diastolic parameters were normal.  2. Right ventricular systolic function is normal. The right ventricular size is normal.  3. Moderate pleural effusion in both left and right lateral regions.  4. The mitral valve is normal in structure. Trivial mitral valve regurgitation. No evidence of mitral stenosis.  5. The aortic valve is tricuspid. Aortic valve regurgitation is not visualized. No aortic stenosis is present.  6. The inferior vena cava is normal in size with <50% respiratory variability, suggesting right atrial pressure of 8 mmHg. Conclusion(s)/Recommendation(s): Normal biventricular function without evidence of hemodynamically significant valvular heart disease. FINDINGS  Left Ventricle: Left ventricular ejection fraction, by estimation, is 60 to 65%. The left ventricle has normal function. The left ventricle has no regional wall motion abnormalities. The left  ventricular internal cavity size was normal in size. There is  no left ventricular hypertrophy. Left ventricular diastolic parameters were normal. Right Ventricle: The right ventricular size is normal. No increase in right ventricular wall thickness. Right ventricular systolic function is normal. Left Atrium: Left atrial size was normal in size. Right Atrium: Right atrial size was normal in size. Pericardium: Trivial pericardial effusion is present. Mitral Valve: The mitral valve is normal in structure. Trivial mitral valve regurgitation. No evidence of mitral valve stenosis. Tricuspid Valve: The tricuspid valve is normal in structure. Tricuspid valve regurgitation is mild . No evidence of tricuspid stenosis. Aortic Valve: The aortic valve is tricuspid. Aortic valve regurgitation is not visualized. No aortic stenosis is present. Pulmonic Valve: The pulmonic valve was grossly normal. Pulmonic valve regurgitation is trivial. Aorta: The aortic root, ascending aorta and aortic arch are all structurally normal, with no evidence of dilitation or obstruction. Venous: The inferior vena cava is normal in size with less than 50% respiratory variability, suggesting right atrial pressure of 8 mmHg. IAS/Shunts: No atrial level shunt detected by color flow Doppler. Additional Comments: There is a moderate pleural effusion in both left and right lateral regions.  LEFT VENTRICLE PLAX 2D LVIDd:         5.10 cm  Diastology LVIDs:         3.30 cm  LV e' lateral:   8.27 cm/s LV PW:         0.90 cm  LV E/e' lateral: 9.2 LV IVS:        0.70 cm  LV e' medial:    6.64 cm/s LVOT diam:     1.80 cm  LV E/e' medial:  11.5 LV SV:         30 LV SV Index:   15 LVOT Area:  2.54 cm  RIGHT VENTRICLE RV S prime:     14.80 cm/s TAPSE (M-mode): 2.4 cm LEFT ATRIUM           Index       RIGHT ATRIUM           Index LA diam:      3.00 cm 1.56 cm/m  RA Area:     13.80 cm LA Vol (A2C): 44.7 ml 23.21 ml/m RA Volume:   33.50 ml  17.39 ml/m LA Vol  (A4C): 42.6 ml 22.12 ml/m  AORTIC VALVE LVOT Vmax:   77.70 cm/s LVOT Vmean:  49.700 cm/s LVOT VTI:    0.116 m  AORTA Ao Root diam: 3.00 cm MITRAL VALVE MV Area (PHT): 4.96 cm    SHUNTS MV Decel Time: 153 msec    Systemic VTI:  0.12 m MV E velocity: 76.30 cm/s  Systemic Diam: 1.80 cm MV A velocity: 78.80 cm/s MV E/A ratio:  0.97 Buford Dresser MD Electronically signed by Buford Dresser MD Signature Date/Time: 07/07/2020/5:02:22 PM    Final    US THORACENTESIS ASP PLEURAL SPACE W/IMG GUIDE  Result Date: 07/08/2020 INDICATION: Lymphoma, remote colon and thyroid cancers, dyspnea, right pleural effusion.request for diagnostic and therapeutic thoracentesis. EXAM: ULTRASOUND GUIDED RIGHT THORACENTESIS MEDICATIONS: 1% lidocaine 10 mL COMPLICATIONS: None immediate. PROCEDURE: An ultrasound guided thoracentesis was thoroughly discussed with the patient and questions answered. The benefits, risks, alternatives and complications were also discussed. The patient understands and wishes to proceed with the procedure. Written consent was obtained. Ultrasound was performed to localize and mark an adequate pocket of fluid in the right chest. The area was then prepped and draped in the normal sterile fashion. 1% Lidocaine was used for local anesthesia. Under ultrasound guidance a 6 Fr Safe-T-Centesis catheter was introduced. Thoracentesis was performed. The catheter was removed and a dressing applied. FINDINGS: A total of approximately 500 mL of red fluid was removed. Samples were sent to the laboratory as requested by the clinical team. IMPRESSION: Successful ultrasound guided right thoracentesis yielding 500 mL of pleural fluid. Read by: Gareth Eagle, PA-C Electronically Signed   By: Jerilynn Mages.  Shick M.D.   On: 07/08/2020 16:31   Telemetry    07/09/20 NSR HR in the 70-80's- Personally Reviewed  ECG    No new tracing as of 07/09/20- Personally Reviewed  Cardiac Studies   2D Echo 07/07/20  1. Left ventricular  ejection fraction, by estimation, is 60 to 65%. The  left ventricle has normal function. The left ventricle has no regional  wall motion abnormalities. Left ventricular diastolic parameters were  normal.  2. Right ventricular systolic function is normal. The right ventricular  size is normal.  3. Moderate pleural effusion in both left and right lateral regions.  4. The mitral valve is normal in structure. Trivial mitral valve  regurgitation. No evidence of mitral stenosis.  5. The aortic valve is tricuspid. Aortic valve regurgitation is not  visualized. No aortic stenosis is present.  6. The inferior vena cava is normal in size with <50% respiratory  variability, suggesting right atrial pressure of 8 mmHg.   Conclusion(s)/Recommendation(s): Normal biventricular function without  evidence of hemodynamically significant valvular heart disease.   Patient Profile     86 y.o. female with history of paroxysmal atrial fibrillation (prior h/o life threatening bleeding on anticoagulation), stage IV papillary thyroid carcinoma s/p thyroidectomy, post-surgical hypothyroidism, colon CA s/p resection, non-Hodgkin's lymphoma, pancytopenia, HTN, anemia, arthritis, [place], iron deficiency anemia. She was admitted with  n/v, diarrhea, abdominal pain shortly after starting new chemotherapy. She has been found to have new ascites on CT scan (too small to tap), pleural effusion s/p right thoracentesis (blood-tinged fluid, cytology pending), acute hypoxic respiratory failure, abnormal LFTs, hypokalemia and acute metabolic encephalopathywith waxing/waning confusion. On 7/19 developed transient narrow complex tachycardia.  Assessment & Plan    1. Narrow complex tachycardia: -Episode noted on 7/19/21which appeared more consistent with atrial tach vs atrial flutter (less typical for the latter) per chart review. She spontaneously converted back to NSR without intervention. There were two additional episodes  yesterday morning -No recurrence per telemetry review today  -Plan to continue diltiazem CD 180mg  nightly -Lopressor 12.5mg  BID added to regimen yesterday>>continue   -Abnormal thyroid function which may be contributing  2. History of PAF: -Not anticoagulation prior to admission per Dr. Tamala Julian due to h/o life threatening bleeding while on Xarelto -Continue monitoring on telemetry  3. Multiple medical issues: -Includes N/V and diarreha in the setting of new chemotherapy with oncologic history as above, with ascites, pleural effusion s/p thoracentesis (cytology pending), acute hypoxic respiratory failure, abnormal LFTs, and acute metabolic encephalopathy -Management per IM/onocology  -Cytology of pleural fluid returned with reactive mesothelial cells -CT angio 07/07/20 showed moderate right and mild-moderate left pleural effusions, right basilar atelectasis/consolidation and left basilar atelectasis with interlobular septal thickening -BNP only 400 with normal biventricular function and diastolic function so less likely that pleural effusions are primarily heart failure>>low dose lasix started yesterday  -I&O, net positive 10.1L -Weight, 167lb   4. Essential HTN -Stable, 118/65>121/66>150/59 -Continue current regimen   5. Abnormal TSH: -Suppressed at 0.330>>started on Synthroid per IM   6. Depression: -PharmD has alerted under communication that max recommended dose of Celexa for patients over 60 is 20mg /day due to risk of QT prolongation. -QTc 551ms on tele -Recommend to keep K 4.0 or greater and Mg 2.0 or greater - -K+ stable at 3.9 today  -Will add Mg to AM labs     Signed, Kathyrn Drown NP-C [facility] Pager: [phone] 07/09/2020, 7:49 AM     For questions or updates, please contact   Please consult [email or web address] for contact info under Cardiology/STEMI.  Patient seen and examined with Kathyrn Drown NP-C.  Agree as above, with the following exceptions and changes as  noted below. She does not feel much better after thoracentesis. Gen: NAD, CV: RRR, no murmurs, Lungs: decreased bilaterally lower to mid lung field , Abd: soft, Extrem: Warm, well perfused, no edema, Neuro/Psych: alert and oriented x 3, normal mood and affect. All available labs, radiology testing, previous records reviewed. She is clearer today and tells me her goal is to go home. She is net 10L+ and may benefit from a higher dose of lasix for trial of diuresis with bilateral pleural effusions though I suspect this is secondary to overall medical illness and low albumin. Will give lasix 40 mg IV this evening and monitor for response.  Elouise Munroe, MD 07/09/20 3:33 PM

## 2020-07-09 NOTE — Consult Note (Addendum)
Name: Tracy Mclaughlin MRN: [id number] DOB: 1934/03/01    ADMISSION DATE:  06/23/2020 CONSULTATION DATE:  07/09/2020  REFERRING MD :  Dr. Barrett Henle  CHIEF COMPLAINT:  Recurrent Pleural Effusion   BRIEF PATIENT DESCRIPTION:  86 yo female, currently being treated for Lymphoma. Now with Bilateral Recurrent Pleural Effusions   HISTORY OF PRESENT ILLNESS:   Admitted 7/5 with Nausea/Vomiting, Diarrhea. Has Lymphoma without significant response to chemotherapy, recently started on BTK Inhibitor. CT A with free fluid, ascites and mesenteric edema. Treated for possible UTI. Stay complicated with bilateral recurrent pleural effusions. Underwent Thoracentesis on 7/16 for Right Side and 7/20 on Left Side. Gram Stain negative, culture with no growth, cytology with reactive mesothelial cells. On 7/19 patient with narrow complex tachycardia, cardiology consulted and added scheduled lopressor in addition to Cardizem. Pulmonary Consult 7/21.   SIGNIFICANT EVENTS  Right Thora 7/16 > 1.6L Out, Bloody  Left Thora 7/20 > 500 ml Out, Bloody  STUDIES:  CXR 7/15 > 1. Decreased left pleural effusion, with persistent left basilar consolidation. 2. Increasing right pleural effusion, with increasing right basilar Consolidation. CTA Chest 7/19 > Moderate right and mild to moderate left pleural effusions. Right basilar atelectasis/consolidation and left basilar atelectasis. Interlobular septal thickening in the right lung may reflect asymmetric edema. Multifocal adenopathy appears stable to slightly improved. CXR 7/21 > 1. Increase in volume of right pleural effusion with mild interstitial edema 2. Bilateral airspace opacities concerning for multifocal pneumonia.   PAST MEDICAL HISTORY :   has a past medical history of Anemia, Arthritis, Cancer ([place]), Colon cancer ([place]), Diverticulosis, Hypertension, Lymphoma ([place]), Non Hodgkin's lymphoma ([place]), Pancytopenia ([place]), Papillary thyroid carcinoma ([place]),  Paroxysmal atrial fibrillation ([place]), Post-surgical hypothyroidism, Pulmonary embolus ([place]) (11/2011), Subarachnoid hemorrhage ([place]) (1996), and Thyroid cancer ([place]).  has a past surgical history that includes Knee arthroscopy (Left, 1998); Tuboplasty / tubotubal anastomosis; Subdural hematoma evacuation via craniotomy; Rotator cuff repair (Right, 1998); Colon resection (11/12); Thyroidectomy (05/23/2012); Appendectomy (1959); Cholecystectomy (1999); Colonoscopy (10/05/2011); Esophagogastroduodenoscopy (N/A, 04/17/2014); Colonoscopy (N/A, 04/17/2014); Lymph node dissection (Left, 05/04/2019); and Portacath placement (N/A, 05/15/2019). Prior to Admission medications   Medication Sig Start Date End Date Taking? Authorizing Provider  acalabrutinib (CALQUENCE) 100 MG capsule Take 1 capsule (100 mg total) by mouth daily. Patient taking differently: Take 100 mg by mouth at bedtime.  06/17/20  Yes [name], MD  citalopram (CELEXA) 40 MG tablet Take 40 mg by mouth daily.   Yes [provider]  diltiazem (DILACOR XR) 180 MG 24 hr capsule Take 1 capsule (180 mg total) by mouth daily. Patient taking differently: Take 180 mg by mouth at bedtime.  12/24/14  Yes Belva Crome, MD  diphenhydrAMINE (BENADRYL) 25 MG tablet Take 25 mg by mouth at bedtime as needed. For itching/sleep   Yes [provider]  levothyroxine (SYNTHROID, LEVOTHROID) 112 MCG tablet Take 224 mcg by mouth daily before breakfast. Pt takes two 112 mcg tablets to equal 224 mcg once a day   Yes [provider]  ondansetron (ZOFRAN) 8 MG tablet Take 1 tablet (8 mg total) by mouth every 8 (eight) hours as needed for nausea or vomiting. Patient not taking: Reported on 04/02/2020 05/10/19   Owens Shark, NP   Allergies  Allergen Reactions   Demerol [Meperidine Hcl] Nausea And Vomiting    FAMILY HISTORY:  family history includes Coronary artery disease in her mother; Diabetes in her mother and sister; Hypertension in her  mother and sister; Kidney cancer in her  sister; Lung cancer in her brother; Stroke in her sister. SOCIAL HISTORY:  reports that she quit smoking about 50 years ago. Her smoking use included cigarettes. She has a 12.00 pack-year smoking history. She has never used smokeless tobacco. She reports previous alcohol use. She reports that she does not use drugs.  REVIEW OF SYSTEMS:   Constitutional: Negative for fever, chills, weight loss, malaise/fatigue and diaphoresis.  HENT: Negative for hearing loss, ear pain, nosebleeds, congestion, sore throat, neck pain, tinnitus and ear discharge.   Eyes: Negative for blurred vision, double vision, photophobia, pain, discharge and redness.  Respiratory: Negative for cough, hemoptysis, sputum production, shortness of breath, wheezing and stridor.   Cardiovascular: Negative for chest pain, palpitations, orthopnea, claudication, leg swelling and PND.  Gastrointestinal: Negative for heartburn, nausea, vomiting, abdominal pain, diarrhea, constipation, blood in stool and melena.  Genitourinary: Negative for dysuria, urgency, frequency, hematuria and flank pain.  Musculoskeletal: Negative for myalgias, back pain, joint pain and falls.  Skin: Negative for itching and rash.  Neurological: Negative for dizziness, tingling, tremors, sensory change, speech change, focal weakness, seizures, loss of consciousness, weakness and headaches.  Endo/Heme/Allergies: Negative for environmental allergies and polydipsia. Does not bruise/bleed easily.  SUBJECTIVE:   VITAL SIGNS: Temp:  [97.8 F (36.6 C)-98.5 F (36.9 C)] 98.1 F (36.7 C) (07/21 0725) Pulse Rate:  [71-87] 83 (07/21 1218) Resp:  [18-26] 20 (07/21 1218) BP: (118-153)/(55-83) 153/83 (07/21 1218) SpO2:  [92 %-97 %] 97 % (07/21 1218) Weight:  [76.2 kg] 76.2 kg (07/20 1800)  PHYSICAL EXAMINATION: General:  Elderly female, lying in bed, no distress  Neuro:  Alert, oriented, follows commands  HEENT:  Dry MM    Cardiovascular:  RRR, no MRG Lungs:  Exp Wheeze, mild tachypnea Abdomen:  Soft, non-tender, non-distended  Musculoskeletal:  -edema  Skin:  Intact   Recent Labs  Lab 07/07/20 0544 07/08/20 0340 07/09/20 0537  NA 141 143 141  K 3.8 3.8 3.9  CL 105 106 101  CO2 26 30 34*  BUN 9 9 10   CREATININE 0.50 0.50 0.46  GLUCOSE 107* 109* 120*   Recent Labs  Lab 07/07/20 0544 07/08/20 0340 07/09/20 0537  HGB 11.5* 10.5* 11.0*  HCT 36.0 33.4* 33.9*  WBC 4.2 3.7* 4.5  PLT 111* 117* 130*   DG Chest 1 View  Result Date: 07/08/2020 CLINICAL DATA:  86 year old female status post right thoracentesis. EXAM: CHEST  1 VIEW COMPARISON:  Chest radiograph dated 07/06/2020. FINDINGS: Right-sided Port-A-Cath in similar position. Bilateral patchy airspace opacities, right greater left with interval progression compared to prior radiograph may represent edema or pneumonia. Clinical correlation is recommended. Small bilateral pleural effusions. No pneumothorax. Stable cardiac silhouette. Atherosclerotic calcification of the aorta. No acute osseous pathology. IMPRESSION: 1. Small bilateral pleural effusions.  No pneumothorax. 2. Bilateral airspace opacities, progressed since the prior radiograph. Electronically Signed   By: Anner Crete M.D.   On: 07/08/2020 18:26   DG Chest 2 View  Result Date: 07/09/2020 CLINICAL DATA:  Follow-up pleural effusion. EXAM: CHEST - 2 VIEW COMPARISON:  07/08/20 FINDINGS: Right chest wall port a catheter is noted with tip projecting over the cavoatrial junction. Interval increase in volume of right pleural effusion. Mild diffuse pulmonary edema. Bilateral airspace opacities are noted within the left upper lobe, right midlung and right base concerning for multifocal pneumonia. IMPRESSION: 1. Increase in volume of right pleural effusion with mild interstitial edema 2. Bilateral airspace opacities concerning for multifocal pneumonia. Electronically Signed   By: Lovena Le  Clovis Riley M.D.    On: 07/09/2020 11:21   US THORACENTESIS ASP PLEURAL SPACE W/IMG GUIDE  Result Date: 07/08/2020 INDICATION: Lymphoma, remote colon and thyroid cancers, dyspnea, right pleural effusion.request for diagnostic and therapeutic thoracentesis. EXAM: ULTRASOUND GUIDED RIGHT THORACENTESIS MEDICATIONS: 1% lidocaine 10 mL COMPLICATIONS: None immediate. PROCEDURE: An ultrasound guided thoracentesis was thoroughly discussed with the patient and questions answered. The benefits, risks, alternatives and complications were also discussed. The patient understands and wishes to proceed with the procedure. Written consent was obtained. Ultrasound was performed to localize and mark an adequate pocket of fluid in the right chest. The area was then prepped and draped in the normal sterile fashion. 1% Lidocaine was used for local anesthesia. Under ultrasound guidance a 6 Fr Safe-T-Centesis catheter was introduced. Thoracentesis was performed. The catheter was removed and a dressing applied. FINDINGS: A total of approximately 500 mL of red fluid was removed. Samples were sent to the laboratory as requested by the clinical team. IMPRESSION: Successful ultrasound guided right thoracentesis yielding 500 mL of pleural fluid. Read by: Gareth Eagle, PA-C Electronically Signed   By: Jerilynn Mages.  Shick M.D.   On: 07/08/2020 16:31    ASSESSMENT / PLAN:  Acute Hypoxic Respiratory Failure secondary to recurrent bilateral pleural effusions, concern for malignancy vs acute heart failure/volume overload, vs infectious process  -PCT <0.10, Afebrile, WBC 3.7  -[place] ABD Questionable for Cirrhosis  Plan -Flow Cytometry Pending. Send pH/Glucose/amylase/LHD >> add onto thoracentesis from 7/20 >> If pleural effusions are secondary to malignancy can plan for pleurx cath placement   -Follow Culture Data  -Obtain Repeat PCT/BMP/Serum LDH -Cardiology Consulted with plans for Lasix 20 mg BID >> Patient is 10.1L positive since admission -Titrate Supplemental  Oxygen for Saturation Goal > 90 (Currently on 4L [place])    Hayden Pedro, AGACNP-BC [facility]  Pgr: [phone]  PCCM Pgr: [phone]

## 2020-07-09 NOTE — Progress Notes (Addendum)
HEMATOLOGY-ONCOLOGY PROGRESS NOTE  SUBJECTIVE: Ms. Bickhart continues to have dyspnea.  The dyspnea did not improve following thoracentesis yesterday.  Oncology History  Non Hodgkin's lymphoma ([place])  09/29/2016 Initial Diagnosis   Non Hodgkin's lymphoma ([place])   03/22/2019 - 04/23/2019 Chemotherapy   The patient had palonosetron (ALOXI) injection 0.25 mg, 0.25 mg, Intravenous,  Once, 1 of 4 cycles Administration: 0.25 mg (03/22/2019) riTUXimab (RITUXAN) 800 mg in sodium chloride 0.9 % 250 mL (2.4242 mg/mL) infusion, 375 mg/m2 = 800 mg, Intravenous,  Once, 1 of 4 cycles Administration: 800 mg (03/22/2019) bendamustine (BENDEKA) 150 mg in sodium chloride 0.9 % 50 mL (2.6786 mg/mL) chemo infusion, 70 mg/m2 = 150 mg (100 % of original dose 70 mg/m2), Intravenous,  Once, 1 of 4 cycles Dose modification: 70 mg/m2 (original dose 70 mg/m2, Cycle 1, Reason: Provider Judgment) Administration: 150 mg (03/22/2019), 150 mg (03/23/2019)  for chemotherapy treatment.    05/18/2019 - 06/28/2019 Chemotherapy   The patient had DOXOrubicin (ADRIAMYCIN) chemo injection 82 mg, 40 mg/m2 = 82 mg (100 % of original dose 40 mg/m2), Intravenous,  Once, 2 of 6 cycles Dose modification: 40 mg/m2 (original dose 40 mg/m2, Cycle 1, Reason: Provider Judgment) Administration: 82 mg (05/18/2019), 82 mg (06/08/2019) palonosetron (ALOXI) injection 0.25 mg, 0.25 mg, Intravenous,  Once, 2 of 6 cycles Administration: 0.25 mg (05/18/2019), 0.25 mg (06/08/2019) pegfilgrastim (NEULASTA ONPRO KIT) injection 6 mg, 6 mg, Subcutaneous, Once, 2 of 6 cycles Administration: 6 mg (05/18/2019), 6 mg (06/08/2019) vinCRIStine (ONCOVIN) 1 mg in sodium chloride 0.9 % 50 mL chemo infusion, 1 mg (100 % of original dose 1 mg), Intravenous,  Once, 2 of 6 cycles Dose modification: 1 mg (original dose 1 mg, Cycle 1, Reason: Provider Judgment) Administration: 1 mg (05/18/2019), 1 mg (06/08/2019) riTUXimab (RITUXAN) 800 mg in sodium chloride 0.9 % 250 mL (2.4242 mg/mL)  infusion, 375 mg/m2 = 800 mg, Intravenous,  Once, 2 of 6 cycles Administration: 800 mg (05/18/2019), 800 mg (06/08/2019) cyclophosphamide (CYTOXAN) 1,240 mg in sodium chloride 0.9 % 250 mL chemo infusion, 600 mg/m2 = 1,540 mg, Intravenous,  Once, 2 of 6 cycles Dose modification: 600 mg/m2 (original dose 750 mg/m2, Cycle 2, Reason: Provider Judgment) Administration: 1,240 mg (05/18/2019), 1,240 mg (06/08/2019) fosaprepitant (EMEND) 150 mg, dexamethasone (DECADRON) 12 mg in sodium chloride 0.9 % 145 mL IVPB, , Intravenous,  Once, 2 of 6 cycles Administration:  (05/18/2019),  (06/08/2019)  for chemotherapy treatment.    07/19/2019 -  Chemotherapy   The patient had riTUXimab (RITUXAN) 700 mg in sodium chloride 0.9 % 250 mL (2.1875 mg/mL) infusion, 375 mg/m2 = 700 mg, Intravenous,  Once, 11 of 11 cycles Administration: 700 mg (07/19/2019), 700 mg (08/16/2019), 700 mg (09/13/2019), 700 mg (10/10/2019), 700 mg (11/07/2019), 700 mg (12/07/2019), 700 mg (01/04/2020), 700 mg (02/01/2020), 700 mg (03/05/2020), 700 mg (04/02/2020), 700 mg (04/30/2020) riTUXimab-pvvr (RUXIENCE) 700 mg in sodium chloride 0.9 % 250 mL (2.1875 mg/mL) infusion, 375 mg/m2 = 700 mg (original dose ), Intravenous,  Once, 0 of 1 cycle Dose modification: 375 mg/m2 (Cycle 12)  for chemotherapy treatment.     PHYSICAL EXAMINATION:  Vitals:   07/09/20 1102 07/09/20 1218  BP:  (!) 153/83  Pulse: 71 83  Resp: 20 20  Temp:    SpO2: 96% 97%   Filed Weights   06/23/20 1206 07/08/20 1800  Weight: 174 lb (78.9 kg) 167 lb 15.9 oz (76.2 kg)    Intake/Output from previous day: 07/20 0701 - 07/21 0700 In: 290 [P.O.:240; I.V.:50]  Out: 700 [Urine:700]  GENERAL: Alert HEENT: No thrush LYMPH: Soft mobile 1-2 cm left cervical and left axillary nodes LUNGS: Increased respiratory rate, inspiratory/expiratory wheeze and rhonchi bilaterally, decreased breath sounds throughout the right chest HEART: Regular rate and rhythm ABDOMEN: Nontender, spleen  tip palpable in the left subcostal region SKIN: Multiple ecchymoses noted on her bilateral arms, legs, and trunk NEURO: Alert, follows commands, moves all extremities, speaking appropriately Vascular: No leg edema LABORATORY DATA:  I have reviewed the data as listed CMP Latest Ref Rng & Units 07/09/2020 07/08/2020 07/07/2020  Glucose 70 - 99 mg/dL 120(H) 109(H) 107(H)  BUN 8 - 23 mg/dL _0 Creatinine 0.44 - 1.00 mg/dL 0.46 0.50 0.50  Sodium 135 - 145 mmol/L 141 143 141  Potassium 3.5 - 5.1 mmol/L 3.9 3.8 3.8  Chloride 98 - 111 mmol/L 101 106 105  CO2 22 - 32 mmol/L 34(H) 30 26  Calcium 8.9 - 10.3 mg/dL 7.7(L) 7.3(L) 7.3(L)  Total Protein 6.5 - 8.1 g/dL - 4.8(L) 5.1(L)  Total Bilirubin 0.3 - 1.2 mg/dL - 0.7 0.8  Alkaline Phos 38 - 126 U/L - 156(H) 161(H)  AST 15 - 41 U/L - 37 52(H)  ALT 0 - 44 U/L - 26 31    Lab Results  Component Value Date   WBC 4.5 07/09/2020   HGB 11.0 (L) 07/09/2020   HCT 33.9 (L) 07/09/2020   MCV 105.3 (H) 07/09/2020   PLT 130 (L) 07/09/2020   NEUTROABS 2.8 06/23/2020    DG Chest 1 View  Result Date: 07/08/2020 CLINICAL DATA:  86 year old female status post right thoracentesis. EXAM: CHEST  1 VIEW COMPARISON:  Chest radiograph dated 07/06/2020. FINDINGS: Right-sided Port-A-Cath in similar position. Bilateral patchy airspace opacities, right greater left with interval progression compared to prior radiograph may represent edema or pneumonia. Clinical correlation is recommended. Small bilateral pleural effusions. No pneumothorax. Stable cardiac silhouette. Atherosclerotic calcification of the aorta. No acute osseous pathology. IMPRESSION: 1. Small bilateral pleural effusions.  No pneumothorax. 2. Bilateral airspace opacities, progressed since the prior radiograph. Electronically Signed   By: Anner Crete M.D.   On: 07/08/2020 18:26   DG Chest 1 View  Result Date: 07/04/2020 CLINICAL DATA:  Status post right thoracentesis. EXAM: CHEST  1 VIEW COMPARISON:   Chest x-ray from yesterday. FINDINGS: Unchanged right chest wall port catheter. The heart size and mediastinal contours are within normal limits. Normal pulmonary vascularity. Trace residual right pleural effusion status post thoracentesis. Improved aeration at the right lung base. No pneumothorax. Unchanged small left pleural effusion and basilar atelectasis. No acute osseous abnormality. IMPRESSION: 1. Trace residual right pleural effusion status post thoracentesis. No pneumothorax. 2. Unchanged small left pleural effusion and basilar atelectasis. Electronically Signed   By: Titus Dubin M.D.   On: 07/04/2020 11:42   DG Chest 2 View  Result Date: 07/09/2020 CLINICAL DATA:  Follow-up pleural effusion. EXAM: CHEST - 2 VIEW COMPARISON:  07/08/20 FINDINGS: Right chest wall port a catheter is noted with tip projecting over the cavoatrial junction. Interval increase in volume of right pleural effusion. Mild diffuse pulmonary edema. Bilateral airspace opacities are noted within the left upper lobe, right midlung and right base concerning for multifocal pneumonia. IMPRESSION: 1. Increase in volume of right pleural effusion with mild interstitial edema 2. Bilateral airspace opacities concerning for multifocal pneumonia. Electronically Signed   By: Kerby Moors M.D.   On: 07/09/2020 11:21   DG Chest 2 View  Result Date: 07/06/2020 CLINICAL DATA:  Worsening shortness of breath. EXAM: CHEST - 2 VIEW COMPARISON:  07/04/2020 FINDINGS: The cardiac silhouette remains grossly normal in size, spur partially obscured by significantly increased patchy opacity in the right lower lung zone. Small amount of posterior pleural fluid. Mild linear density at the left lung base. Right subclavian porta catheter tip in the region of the superior cavoatrial junction. Unremarkable bones. IMPRESSION: 1. Significantly increased right lower lung zone pneumonia. 2. Small amount of posterior pleural fluid. 3. Mild left basilar  atelectasis. Electronically Signed   By: Claudie Revering M.D.   On: 07/06/2020 16:24   DG Chest 2 View  Result Date: 07/03/2020 CLINICAL DATA:  Dyspnea, pleural effusions EXAM: CHEST - 2 VIEW COMPARISON:  06/26/2020 FINDINGS: Frontal and lateral views of the chest demonstrates stable right chest wall port. Cardiac silhouette is unremarkable. There is now a moderate right pleural effusion with right basilar consolidation. Decreased left pleural effusion, with only trace fluid at the left costophrenic angle. Persistent retrocardiac consolidation. No pneumothorax. No acute bony abnormalities. IMPRESSION: 1. Decreased left pleural effusion, with persistent left basilar consolidation. 2. Increasing right pleural effusion, with increasing right basilar consolidation. Electronically Signed   By: Randa Ngo M.D.   On: 07/03/2020 16:11   DG Chest 2 View  Result Date: 06/26/2020 CLINICAL DATA:  Dyspnea. EXAM: CHEST - 2 VIEW COMPARISON:  Chest CT 05/22/2020 FINDINGS: Right chest port remains in place. Hazy opacity at the left lung base consistent with pleural effusion and airspace disease/atelectasis. Right lung is clear. Upper normal heart size. Mild right hilar prominence likely secondary to combination of vascular structures and adenopathy is seen on recent CT. Fullness in the upper mediastinum likely secondary to adenopathy. No pneumothorax. No pulmonary edema. Surgical clips in the left axilla. IMPRESSION: 1. Hazy opacity at the left lung base consistent with pleural effusion and airspace disease/atelectasis. 2. Mild right hilar prominence likely secondary to combination of vascular structures and adenopathy seen on recent CT. Electronically Signed   By: Keith Rake M.D.   On: 06/26/2020 15:54   CT ANGIO CHEST PE W OR WO CONTRAST  Result Date: 07/07/2020 CLINICAL DATA:  Lymphoma, recurrent right pleural effusion bloody tap EXAM: CT ANGIOGRAPHY CHEST WITH CONTRAST TECHNIQUE: Multidetector CT imaging of the  chest was performed using the standard protocol during bolus administration of intravenous contrast. Multiplanar CT image reconstructions and MIPs were obtained to evaluate the vascular anatomy. CONTRAST:  177m OMNIPAQUE IOHEXOL 350 MG/ML SOLN COMPARISON:  None. FINDINGS: Cardiovascular: Aortic atherosclerosis. Normal heart size. Central pulmonary arteries are patent. Mediastinum/Nodes: Adenopathy is identified in the supraclavicular region, bilateral axilla, and mediastinum. Nodes appear and measures slightly smaller in size. Lungs/Pleura: Moderate right and mild to moderate left pleural effusions measuring simple fluid in density. Imaging performed during expiration. Persistent atelectasis at the left lung base. New atelectasis/consolidation at the right lung base. Interlobular septal thickening the non dependent right lung may reflect asymmetric edema. Upper Abdomen: Ascites. Unchanged ill-defined hypoattenuating lesion in the superior spleen. Musculoskeletal: No new abnormality. Review of the MIP images confirms the above findings. IMPRESSION: Moderate right and mild to moderate left pleural effusions. Right basilar atelectasis/consolidation and left basilar atelectasis. Interlobular septal thickening in the right lung may reflect asymmetric edema. Multifocal adenopathy appears stable to slightly improved. Electronically Signed   By: PMacy MisM.D.   On: 07/07/2020 14:33   CT Abdomen Pelvis W Contrast  Result Date: 06/23/2020 CLINICAL DATA:  86year old female with abdominal pain, nausea and vomiting. History of lymphoma. EXAM:  CT ABDOMEN AND PELVIS WITH CONTRAST TECHNIQUE: Multidetector CT imaging of the abdomen and pelvis was performed using the standard protocol following bolus administration of intravenous contrast. CONTRAST:  26m OMNIPAQUE IOHEXOL 300 MG/ML  SOLN COMPARISON:  CT of the chest abdomen pelvis dated 05/22/2020. FINDINGS: Lower chest: Partially visualized moderate left and trace right  pleural effusions, new since the prior CT. There is associated partial compressive atelectasis of the left lower lobe. Pneumonia is not excluded. Clinical correlation is recommended. No intra-abdominal free air. Interval development of a small ascites, new or significantly increased since the prior CT. Hepatobiliary: A 1 cm hypodense focus along the posterior liver capsule is not characterized but may represent a cyst or focal area of scarring. This is similar to prior CT. Subcentimeter hypodense focus in the anterior liver is too small to characterize. There is mild intrahepatic biliary ductal dilatation, likely post cholecystectomy. The common bile duct is dilated measuring 14 mm. No retained calcified stone noted in the central CBD. Pancreas: The pancreas is unremarkable as visualized. Spleen: Mildly enlarged spleen measuring 15 cm in craniocaudal length. Adrenals/Urinary Tract: The adrenal glands are poorly visualized but grossly unremarkable. There is mild fullness of the renal collecting systems bilaterally without frank hydronephrosis. Mild hazy appearance of the urothelium noted. Correlation with urinalysis recommended to exclude UTI. There is symmetric enhancement and excretion of contrast by both kidneys. Subcentimeter left renal hypodense focus is not characterized. The urinary bladder is partially distended. Probable chronic bladder wall thickening and perivesical stranding. Correlation with urinalysis recommended to exclude UTI. Stomach/Bowel: There is sigmoid diverticulosis without definite active inflammatory changes. There is postsurgical changes of bowel with anastomotic suture in the right lower quadrant. No definite evidence of bowel obstruction. Evaluation however is limited in the absence of oral contrast. Vascular/Lymphatic: There is moderate aortoiliac atherosclerotic disease. The IVC is unremarkable as visualized. The SMV, splenic vein, and main portal vein are patent. No portal venous gas.  Bulky retroperitoneal adenopathy encasing the abdominal aorta and IVC as well as enlarged bilateral iliac chain lymph nodes in keeping with history of lymphoma. Overall the degree of adenopathy is relatively similar to prior CT. Reproductive: The uterus is poorly visualized. Other: Diffuse mesenteric edema, new since the prior CT. There is scattered mesenteric adenopathy as seen previously. Musculoskeletal: Degenerative changes of the spine. Minimal compression fracture of the anterior superior and inferior endplates of the L3, new since the prior CT, likely acute or subacute. IMPRESSION: 1. Interval development of partially visualized moderate left and trace right pleural effusions, as well as development of diffuse mesenteric edema and ascites, new since the prior CT. 2. Bulky retroperitoneal and iliac chain adenopathy in keeping with history of lymphoma. Overall the degree of adenopathy is relatively similar to prior CT. 3. Mild splenomegaly. 4. Minimal compression fracture of the superior and inferior endplate of L3, likely acute or subacute. 5. Sigmoid diverticulosis. 6. Aortic Atherosclerosis (ICD10-I70.0). Electronically Signed   By: AAnner CreteM.D.   On: 06/23/2020 16:39   ECHOCARDIOGRAM COMPLETE  Result Date: 07/07/2020    ECHOCARDIOGRAM REPORT   Patient Name:   DCARTINA BROUSSEAUDate of Exam: 07/07/2020 Medical Rec #:  [id number]           Height:       68.0 in Accession #:    [id number]          Weight:       174.0 lb Date of Birth:  [birth year]  BSA:          1.926 m Patient Age:    86 years             BP:           132/65 mmHg Patient Gender: F                    HR:           855 bpm. Exam Location:  Inpatient Procedure: 2D Echo Indications:    Atrial tachycardia [facility]) [[id number]]  History:        Patient has prior history of Echocardiogram examinations, most                 recent 05/31/2019. Risk Factors:Hypertension. Lymphoma , thyroid                 disease, thyroid cancer. Past  history of afib.  Sonographer:    Darlina Sicilian RDCS Referring Phys: [place]  1. Left ventricular ejection fraction, by estimation, is 60 to 65%. The left ventricle has normal function. The left ventricle has no regional wall motion abnormalities. Left ventricular diastolic parameters were normal.  2. Right ventricular systolic function is normal. The right ventricular size is normal.  3. Moderate pleural effusion in both left and right lateral regions.  4. The mitral valve is normal in structure. Trivial mitral valve regurgitation. No evidence of mitral stenosis.  5. The aortic valve is tricuspid. Aortic valve regurgitation is not visualized. No aortic stenosis is present.  6. The inferior vena cava is normal in size with <50% respiratory variability, suggesting right atrial pressure of 8 mmHg. Conclusion(s)/Recommendation(s): Normal biventricular function without evidence of hemodynamically significant valvular heart disease. FINDINGS  Left Ventricle: Left ventricular ejection fraction, by estimation, is 60 to 65%. The left ventricle has normal function. The left ventricle has no regional wall motion abnormalities. The left ventricular internal cavity size was normal in size. There is  no left ventricular hypertrophy. Left ventricular diastolic parameters were normal. Right Ventricle: The right ventricular size is normal. No increase in right ventricular wall thickness. Right ventricular systolic function is normal. Left Atrium: Left atrial size was normal in size. Right Atrium: Right atrial size was normal in size. Pericardium: Trivial pericardial effusion is present. Mitral Valve: The mitral valve is normal in structure. Trivial mitral valve regurgitation. No evidence of mitral valve stenosis. Tricuspid Valve: The tricuspid valve is normal in structure. Tricuspid valve regurgitation is mild . No evidence of tricuspid stenosis. Aortic Valve: The aortic valve is tricuspid. Aortic valve  regurgitation is not visualized. No aortic stenosis is present. Pulmonic Valve: The pulmonic valve was grossly normal. Pulmonic valve regurgitation is trivial. Aorta: The aortic root, ascending aorta and aortic arch are all structurally normal, with no evidence of dilitation or obstruction. Venous: The inferior vena cava is normal in size with less than 50% respiratory variability, suggesting right atrial pressure of 8 mmHg. IAS/Shunts: No atrial level shunt detected by color flow Doppler. Additional Comments: There is a moderate pleural effusion in both left and right lateral regions.  LEFT VENTRICLE PLAX 2D LVIDd:         5.10 cm  Diastology LVIDs:         3.30 cm  LV e' lateral:   8.27 cm/s LV PW:         0.90 cm  LV E/e' lateral: 9.2 LV IVS:        0.70  cm  LV e' medial:    6.64 cm/s LVOT diam:     1.80 cm  LV E/e' medial:  11.5 LV SV:         30 LV SV Index:   15 LVOT Area:     2.54 cm  RIGHT VENTRICLE RV S prime:     14.80 cm/s TAPSE (M-mode): 2.4 cm LEFT ATRIUM           Index       RIGHT ATRIUM           Index LA diam:      3.00 cm 1.56 cm/m  RA Area:     13.80 cm LA Vol (A2C): 44.7 ml 23.21 ml/m RA Volume:   33.50 ml  17.39 ml/m LA Vol (A4C): 42.6 ml 22.12 ml/m  AORTIC VALVE LVOT Vmax:   77.70 cm/s LVOT Vmean:  49.700 cm/s LVOT VTI:    0.116 m  AORTA Ao Root diam: 3.00 cm MITRAL VALVE MV Area (PHT): 4.96 cm    SHUNTS MV Decel Time: 153 msec    Systemic VTI:  0.12 m MV E velocity: 76.30 cm/s  Systemic Diam: 1.80 cm MV A velocity: 78.80 cm/s MV E/A ratio:  0.97 Buford Dresser MD Electronically signed by Buford Dresser MD Signature Date/Time: 07/07/2020/5:02:22 PM    Final    [place] ASCITES (ABDOMEN LIMITED)  Result Date: 07/01/2020 CLINICAL DATA:  History of lymphoma, now with concern for symptomatic intra-abdominal ascites. Please perform ascites search ultrasound and ultrasound-guided paracentesis as indicated. EXAM: LIMITED ABDOMEN ULTRASOUND FOR ASCITES TECHNIQUE: Limited ultrasound  survey for ascites was performed in all four abdominal quadrants. COMPARISON:  CT abdomen pelvis-06/23/2020 FINDINGS: Sonographic evaluation demonstrates a trace amount of intra-abdominal ascites, too small to allow for safe ultrasound-guided paracentesis. Note is made of small bilateral pleural effusions, the right side of which appears new compared to abdominal CT performed 06/23/2020. IMPRESSION: 1. Trace amount of intra-abdominal ascites, too small to allow for safe ultrasound-guided paracentesis. No paracentesis attempted. 2. Small bilateral pleural effusions - the left-sided pleural effusion appears similar to abdominal CT performed 06/23/2020 however the right-sided pleural effusion appears new of the CT. Electronically Signed   By: Sandi Mariscal M.D.   On: 07/01/2020 15:11   US Abdomen Limited RUQ  Result Date: 07/02/2020 CLINICAL DATA:  Abnormal LFTs EXAM: ULTRASOUND ABDOMEN LIMITED RIGHT UPPER QUADRANT COMPARISON:  CT 06/23/2020.  Ultrasound 07/01/2020 FINDINGS: Gallbladder: Prior cholecystectomy Common bile duct: Diameter: Prominent measuring up to 10 mm, likely related to post cholecystectomy state. Liver: Heterogeneous, slightly increased echotexture. Subtle nodular contours to the liver surface. Findings raise the possibility of cirrhosis. No suspicious focal hepatic abnormality. Portal vein is patent on color Doppler imaging with normal direction of blood flow towards the liver. Other: Ascites and right effusion noted. Prominent porta hepatis lymph nodes. IMPRESSION: Heterogeneous, increased echotexture throughout the liver. Subtle nodular contour seen in some areas of the liver. Findings suggest the possibility of cirrhosis. Perihepatic ascites and right pleural effusion. Mild biliary ductal dilatation, likely related to post cholecystectomy state. Prominent porta hepatis lymph nodes, likely related to liver disease. Electronically Signed   By: Rolm Baptise M.D.   On: 07/02/2020 08:52   US  THORACENTESIS ASP PLEURAL SPACE W/IMG GUIDE  Result Date: 07/08/2020 INDICATION: Lymphoma, remote colon and thyroid cancers, dyspnea, right pleural effusion.request for diagnostic and therapeutic thoracentesis. EXAM: ULTRASOUND GUIDED RIGHT THORACENTESIS MEDICATIONS: 1% lidocaine 10 mL COMPLICATIONS: None immediate. PROCEDURE: An ultrasound guided thoracentesis was thoroughly discussed  with the patient and questions answered. The benefits, risks, alternatives and complications were also discussed. The patient understands and wishes to proceed with the procedure. Written consent was obtained. Ultrasound was performed to localize and mark an adequate pocket of fluid in the right chest. The area was then prepped and draped in the normal sterile fashion. 1% Lidocaine was used for local anesthesia. Under ultrasound guidance a 6 Fr Safe-T-Centesis catheter was introduced. Thoracentesis was performed. The catheter was removed and a dressing applied. FINDINGS: A total of approximately 500 mL of red fluid was removed. Samples were sent to the laboratory as requested by the clinical team. IMPRESSION: Successful ultrasound guided right thoracentesis yielding 500 mL of pleural fluid. Read by: Gareth Eagle, PA-C Electronically Signed   By: Jerilynn Mages.  Shick M.D.   On: 07/08/2020 16:31   US THORACENTESIS ASP PLEURAL SPACE W/IMG GUIDE  Result Date: 07/04/2020 INDICATION: Patient with history of lymphoma, remote colon and thyroid cancers, dyspnea, right pleural effusion. Request made for diagnostic and therapeutic right thoracentesis. EXAM: ULTRASOUND GUIDED DIAGNOSTIC AND THERAPEUTIC RIGHT THORACENTESIS MEDICATIONS: 1% lidocaine to skin and subcutaneous tissue COMPLICATIONS: None immediate. PROCEDURE: An ultrasound guided thoracentesis was thoroughly discussed with the patient and questions answered. The benefits, risks, alternatives and complications were also discussed. The patient understands and wishes to proceed with the  procedure. Written consent was obtained. Ultrasound was performed to localize and mark an adequate pocket of fluid in the right chest. The area was then prepped and draped in the normal sterile fashion. 1% Lidocaine was used for local anesthesia. Under ultrasound guidance a 6 Fr Safe-T-Centesis catheter was introduced. Thoracentesis was performed. The catheter was removed and a dressing applied. FINDINGS: A total of approximately 1.6 liters of blood-tinged fluid was removed. Samples were sent to the laboratory as requested by the clinical team. IMPRESSION: Successful ultrasound guided diagnostic and therapeutic right thoracentesis yielding 1.6 liters of pleural fluid. Read by: Rowe Robert, PA-C Electronically Signed   By: Lucrezia Europe M.D.   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 Status post weekly Rituxan x4 03/01/2012 through 03/22/2012. She completed 4 "maintenance" doses of Rituxan, last on 12/19/2012. A restaging CT on 02/09/2013 showed no evidence of lymphoma.   Lymph node lateral to the thyroid bed on a neck ultrasound 02/21/2014, status post an FNA biopsy concerning for a lymphoproliferative disorder.  PET scan 09/28/2016 with active lymphoma within the neck, chest, abdomen, pelvis; splenic enlargement and hypermetabolism suspicious for splenic involvement.  Initiation of Rituxan weekly 4 09/29/2016  Initiation of maintenance Rituxan on a 3 month schedule 12/23/2016; final Rituxan 08/31/2018  Thyroid ultrasound 02/07/2019-left cervical lymphadenopathy  PET scan 03/08/2019-extensive recurrent hypermetabolic lymphoma involving the neck, chest, abdomen and pelvis.  03/16/2019 left cervical lymph node biopsy-features consistent with previously diagnosed non-Hodgkin's B-cell lymphoma, phenotypically consistent with marginal zone lymphoma.  Flow cytometry with lambda restricted B-cell population without expression of CD5 or CD10 comprising 87% of all lymphocytes.  Cycle 1 bendamustine/Rituxan 03/22/2019  Excision deep left axillary lymph nodes 05/04/2019-non-Hodgkin's B-cell lymphoma with differential including a marginal zone lymphoma and atypical small lymphocytic lymphoma. Flow cytometry with monoclonal B-cell population without expression of CD5 or CD10, comprises 96% of all lymphocytes.  Cycle 1 [facility]/Rituxan 05/18/2019  Cycle 2 [facility]/Rituxan 06/08/2019  CTs 06/15/2019-partial improvement in diffuse adenopathy, stable mild splenomegaly  Cycle 1 Revlimid/rituximab 07/19/2019 (Revlimid start 07/20/2019)  Cycle 2 Revlimid/rituximab  08/16/2019 (Revlimid placed on hold 08/30/2019 due to neutropenia)  Cycle 3 of Revlimid/rituximab 09/13/2019 (Revlimid schedule changed to 14 days on/14 days off)  Cycle 4 Revlimid/rituximab 10/10/2019  Cycle 5 Revlimid/rituximab 11/07/2019  Cycle 6 Revlimid/rituximab 12/07/2019  CTs 12/28/2019-diffuse lymphadenopathy-slightly increased compared to 06/15/2019  Cycle 7 Revlimid/rituximab 01/04/2020  Cycle 8 Revlimid/Rituxan 02/01/2020  Cycle 9 Revlimid/Rituxan 03/05/2020  Cycle 10 Revlimid/Rituxan 04/02/2020  Cycle 11 Revlimid/Rituxan 04/30/2020  CT 05/22/2020-mild increase in left supraclavicular adenopathy, mild increase in chest, retroperitoneal, and pelvic adenopathy. Stable mild splenomegaly.  Acalabrutinib started 06/19/2020  CT abdomen/pelvis 06/23/2020-moderate left and trace right pleural effusions, new diffuse mesenteric edema and ascites, stable retroperitoneal and iliac adenopathy, mild splenomegaly 2. Stage IV (T1bN1b M0) papillary thyroid cancer, status post a thyroidectomy with reimplantation of the left superior parathyroid gland on 05/23/2012, status post radioactive iodine therapy, followed by Dr. Buddy Duty.  3. Stage II (T3 N0) colon cancer, status post a right colectomy 10/19/2011, last  colonoscopy April 2015-sigmoid adenoma removed.  4. History of a pulmonary embolism December 2012.  5. History of Atrial fibrillation 6. Iron deficiency anemia-new 03/18/2014. Hemoccult positive stool. The anemia corrected with iron. No longer taking iron.  Status post an upper endoscopy and colonoscopy by Dr. Carlean Purl April 2015 with no bleeding source identified, benign adenoma removed from the sigmoid colon. 7. Report of an upper gastric intestinal bleed fall 2016-managed in [place]. [name] 8. Left knee replacement May 2017, repeat left knee surgery May 2018 9. Pruritic rash 07/22/2016 10. Nausea and diarrhea 04/02/2019-stool negative for C. difficile toxin 11. 06/18/2019 [facility] admission for symptomatic anemia. 12.  Admission 06/23/2020 with nausea and diarrhea 13.  Respiratory failure, bilateral pleural effusions  Right thoracentesis 07/04/2020-culture and cytology negative  CT chest 07/07/2020-right and left pleural effusions, bilateral basilar atelectasis, interlobular septal thickening in the right lung-asymmetric edema?  Improved adenopathy  Right thoracentesis 07/08/2020  Ms. Ressler has progressive respiratory failure.  The chest x-ray today is consistent with bilateral airspace disease and pleural effusions.  The respiratory failure does not appear related to CHF.  The pleural fluid has a significant number of nucleated cells, but cytology was negative.  I will request flow cytometry on the 07/08/2020 specimen.  It is possible the pleural effusions are related to lymphoma, but other findings during this admission suggest improvement in the lymphoma.  The LDH is lower and the palpable lymphadenopathy appears improved.  The chest CT 07/07/2000 revealed improvement in chest lymphadenopathy.  She may have pneumonia, but she does not have a fever or cough.  I doubt the respiratory symptoms are related to drug toxicity.  I attempted to contact her husband by telephone this afternoon.  He did not  answer.  I reviewed the chest x-ray images.  I discussed the case with Dr. Roger Shelter.  Recommendations: 1.  Continue to hold acalabrutinib 2.  Pulmonary medicine consult to evaluate the respiratory failure 3.  Check flow cytometry on the pleural fluid   LOS: 15 days   Betsy Coder 07/09/20

## 2020-07-09 NOTE — Progress Notes (Addendum)
PROGRESS NOTE   Tracy Mclaughlin  YKZ:[id number] DOB: [birth year] DOA: 06/23/2020 PCP: [name], PA   Chef Complaints:  Nausea, vomiting and poor po intake.  Brief Narrative: 86 y.o.femalewith medical history significant forlow-grade lymphoma without significant response to chemotherapy last year and was recently started onBTK INHIBITORby Dr. [name], hypothyroidism, history of A. fib, pancytopenia presents to the ED with3-4 day history of nausea vomiting and diarrhea and abdominal pain-and right upper quadrant.symptomts started after new chemo on Thursday.Patient denies any fever chest pain.vomitted x3 today and had non bloody diarrhea 4 times today.  ED Course:Hemodynamically stable, afebrile, UA positive for UTI,CT scan abdomen done that showed free fluid with ascites and mesenteric edema, case was discussed Dr. [name] from oncology advised UTI treatment with Zosyn and he will see the patient.  Patient  Was admitted, being managed symptomatically chemo was held diarrhea improved subsequently oncology put the patient back on chemo overall tolerating well. Having nausea anorexia suspecting this is related to her lymphoma. Patient has been started on prednisone to help with appetite and also received trazodone for sleep. Patient remains deconditioned.  She was found to get more sleepy and confused subsequently trazodone and prednisone were discontinued with resolution of her confusion.  Patient was also noticed to have shortness of breath/dyspnea with activity-imaging studies showed pleural effusion underwent right-sided thoracentesis with 1.6 L of blood-tinged fluid removal, cytology reactive medically stable, no growth on culture. PT had suggested skilled nursing facility patient had PICC line SNF as well as home health.  Patient went into rapid tachycardia supraventricular 7/19 a.m. and also with hypoxia-underwent CTA chest no PE but right-sided pleural effusion  atelectasis/consolidation-cardiologist consulted.  Patient converted to sinus rhythm spontaneously. received few days of IV antibiotics , Procalcitonin has been negative/normal- no fever no leukocytosis so stopped  7/20.  Subjective: The patient was seen and examined this morning, status post thoracentesis yesterday, remains in respite distress.. With shortness of breath Respiratory 20, satting 97% on 4 L of oxygen Reporting difficulty sleeping overnight--- sleeping aid medications were necessary Denies any chest pain.  According to nursing staff she is doing much better today.  Assessment & Plan:  Acute hypoxic respiratory failure due to pleural effusion/no PE on the CT. -Remains on 4 L of oxygen, satting 90%-  -Remains in respiratory distress O2 dependent, hypoxic worsening shortness of breath with minimal exertion - Recurrent pleural effusion -07/08/2020 US guided right thoracentesis. Yielded 500 mL of red fluid.   CXR this AM 07/09/20 IMPRESSION: 1. Increase in volume of right pleural effusion with mild interstitial edema 2. Bilateral airspace opacities concerning for multifocal pneumonia.   -Continue gentle diuretics -As needed duo nebs -We will continue empiric antibiotics -Low suspicion for pneumonia, sepsis   Intractable nausea vomitingdiarrhea andabdominal pain:  -Improved Unclear etiology suspecting due to lymphoma.  - Acalabrutinib was held on admission and resumed 7/8. Diarrhea improved.   Acalabrutinib  was again discontinued as her LFTs were worsening. Has new onset ascites on the CT scan-but too small to tap on the ultrasound . Cont supportive measure.  - Prednisone was added to stimulate appetite but discontinued due to confusion  Recurrent pleural effusion: -Monitoring closely  ? etiology ? due to lymphoma,? 2/2 Acalabrutinib ( less likely per oncology) . S/p rt thoracentesis 1.6 L blood-tinged fluid was removed.  Pleural fluid Gram stain no organism, culture  no growth so far, cytology reactive mesothelial cell.   CTA shows moderate right and mild to moderate left pleural effusions-BNP around  400  (Less likely fluid was from a CHF)  -Echo shows preserved EJF 60 to 65%.  no diastolic dysfunction, unclear if related to CHF versus low Albumin.   Cardiology on board will start trial of IV Lasix low-dose, I's and O's: +10.1 L  - order US guided thoracentesis along with labs/cytology,discussed with cardio and  Oncology.   Narrow complex tachycardia 07/07/20 am- pt moved to tele.   Converted to sinus rhythm.  No recurrent episode -Cardiology following closely -Continue diltiazem CD 180 nightly, Lopressor 12.5 mg p.o. twice daily, -Following thyroid function -2D echocardiogram reviewed   Ascites: -new onset ascites on the CT scan-too small to tap on paracentesis per IR.     Abnormal LFTS: 2/2 Acalabrutinib-which is discontinued.  LFTs improving.RUQ [place]- possible cirrhotic changes, hepatitis panel negative.   Acute metabolic encephalopathy from polypharmacy: -Mentation back to baseline today  had  confusion/Lethargy: 7/14 a.m likley polypharmacy and pt feels improved after stopping trazodone/prednisone.   -Continue to complain of insomnia, sleeping medication as needed   She is on Xanax low-dose continue for anxiety.  Continue supportive care.    Hypokalemia.: resolved.   Recent Labs  Lab 07/05/20 0437 07/06/20 0510 07/07/20 0544 07/08/20 0340 07/09/20 0537  K 3.5 3.7 3.8 3.8 3.9   E coli UTI IBB:[id number] 7 days of antibiotics.   Non Hodgkin's lymphoma -Remained stable, -Oncology following -with progressive decline, patientwasstartedpast weekonAcalabrutinib she did not have significant response to chemotherapy last year.  Acalabrutinib was held on admission and resumed  But again stopped due to worsening LFTs by oncology.   Failure to thrive/deconditioning: Remains weak and deconditioned. She refused SNF.  She is high risk for  readmission.  Husband concerned about poor support.  Hypertension: BP is controlled.  Continue Cardizem.    Pancytopenia with anemia/Thrombocytopenia in the setting of lymphoma/chemo: -Improving -Hemoglobin and WBC count overall stable.  Monitor.Heparin was discontinued by hematology due to bilateral lower leg bruise and thrombocytopenia.   Recent Labs  Lab 07/07/20 0544 07/08/20 0340 07/09/20 0537  HGB 11.5* 10.5* 11.0*  HCT 36.0 33.4* 33.9*  WBC 4.2 3.7* 4.5  PLT 111* 117* 130*   History of intermittent atrial fibrillation on Cardizem: Not on anticoagulation due to history of subarachnoid hemorrhage as per cardiology.  Bruise on the skin heparin discontinued per oncology. improving.     DVT prophylaxis: Place and maintain sequential compression device Start: 06/30/20 0901 Heparin stopped due to  extensive LE bruise 7/12 and low platelet counts by hematology. Code Status: DNR Family Communication: plan of care discussed with patient at bedside. Husband is anxious and apprehensive that when she returns home he will not be able to have much support at home.   Status is: Inpatient Remains inpatient appropriate because: Patient remains deconditioned weak, hypoxic.  Dispo: The patient is from: Home               Anticipated d/c is to: SNF as per PT- pt declines SNF and HH.  Continued to engage on placement receomendations.              Anticipated d/c date is: 2 days.  Once shortness of better, and cleared by her oncologist.discussed with her oncology               Patient currently not medically stable.  Nutrition: Diet Order            Diet regular Room service appropriate? Yes; Fluid consistency: Thin  Diet effective now  Body mass index is 25.54 kg/m.  Consultants:see note  Procedures:see note Microbiology:see note  Medications: Scheduled Meds: . Chlorhexidine Gluconate Cloth  6 each Topical Daily  . citalopram  20 mg Oral Daily  . diltiazem  180  mg Oral QHS  . diltiazem  10 mg Intravenous Once  . feeding supplement (KATE FARMS STANDARD 1.4)  325 mL Oral BID BM  . feeding supplement (PRO-STAT SUGAR FREE 64)  30 mL Oral Daily  . furosemide  20 mg Intravenous BID  . heparin lock flush  500 Units Intracatheter Q30 days  . levothyroxine  224 mcg Oral Q0600  . melatonin  3 mg Oral QHS  . metoprolol tartrate  12.5 mg Oral BID  . multivitamin with minerals  1 tablet Oral Daily  . pantoprazole  40 mg Oral Daily   Continuous Infusions: . sodium chloride 10 mL/hr at 07/07/20 0945    Antimicrobials: Anti-infectives (From admission, onward)   Start     Dose/Rate Route Frequency Ordered Stop   07/06/20 1800  cefTRIAXone (ROCEPHIN) 1 g in sodium chloride 0.9 % 100 mL IVPB  Status:  Discontinued        1 g 200 mL/hr over 30 Minutes Intravenous Every 24 hours 07/06/20 1646 07/08/20 1037   07/06/20 1800  azithromycin (ZITHROMAX) tablet 500 mg  Status:  Discontinued        500 mg Oral Daily 07/06/20 1646 07/08/20 1037   06/27/20 0830  cefTRIAXone (ROCEPHIN) 1 g in sodium chloride 0.9 % 100 mL IVPB  Status:  Discontinued        1 g 200 mL/hr over 30 Minutes Intravenous Every 24 hours 06/27/20 0733 06/30/20 1340   06/24/20 0200  piperacillin-tazobactam (ZOSYN) IVPB 3.375 g  Status:  Discontinued        3.375 g 12.5 mL/hr over 240 Minutes Intravenous Every 8 hours 06/23/20 1703 06/27/20 0733   06/23/20 1645  piperacillin-tazobactam (ZOSYN) IVPB 3.375 g        3.375 g 100 mL/hr over 30 Minutes Intravenous  Once 06/23/20 1638 06/23/20 1834   06/23/20 1615  cefTRIAXone (ROCEPHIN) 1 g in sodium chloride 0.9 % 100 mL IVPB  Status:  Discontinued        1 g 200 mL/hr over 30 Minutes Intravenous  Once 06/23/20 1607 06/23/20 1608       Objective: Vitals: Today's Vitals   07/09/20 1000 07/09/20 1100 07/09/20 1102 07/09/20 1218  BP: (!) 141/70   (!) 153/83  Pulse: 71 72 71 83  Resp: (!) 24 (!) 24 20 20   Temp:      TempSrc:      SpO2: 97% 96%  96% 97%  Weight:      Height:      PainSc:        Intake/Output Summary (Last 24 hours) at 07/09/2020 1244 Last data filed at 07/09/2020 1036 Gross per 24 hour  Intake 160 ml  Output 800 ml  Net -640 ml   Filed Weights   06/23/20 1206 07/08/20 1800  Weight: 78.9 kg 76.2 kg   Weight change:    Intake/Output from previous day: 07/20 0701 - 07/21 0700 In: 290 [P.O.:240; I.V.:50] Out: 700 [Urine:700] Intake/Output this shift: Total I/O In: 120 [P.O.:120] Out: 100 [Urine:100]    Physical Exam  Constitution:  Alert, cooperative, persistent shortness of breath, mild distress Psychiatric: Normal and stable mood and affect, cognition intact,   HEENT: Normocephalic, PERRL, otherwise with in Normal limits  Chest:Chest symmetric, mild rhonchi, diminished  breath sounds lower lobes Cardio vascular:  S1/S2, RRR, No murmure, No Rubs or Gallops  pulmonary: Clear to auscultation bilaterally, respirations unlabored, negative wheezes / crackles Abdomen: Soft, non-tender, non-distended, bowel sounds,no masses, no organomegaly Muscular skeletal:  Severe generalized symmetric weakness  Limited exam - in bed, able to move all 4 extremities,  Neuro: CNII-XII intact. , normal motor and sensation, reflexes intact  Extremities: No pitting edema lower extremities, +2 pulses  Skin: Dry, warm to touch, negative for any Rashes,  Wounds: None visible, port cath on the chest visible, Please see nurses documentation for details       Data Reviewed: I have personally reviewed following labs and imaging studies CBC: Recent Labs  Lab 07/05/20 0437 07/06/20 0510 07/07/20 0544 07/08/20 0340 07/09/20 0537  WBC 3.5* 3.2* 4.2 3.7* 4.5  HGB 10.1* 10.9* 11.5* 10.5* 11.0*  HCT 31.6* 33.8* 36.0 33.4* 33.9*  MCV 107.5* 107.6* 107.1* 108.1* 105.3*  PLT 73* 87* 111* 117* 621*   Basic Metabolic Panel: Recent Labs  Lab 07/05/20 0437 07/06/20 0510 07/07/20 0544 07/08/20 0340 07/09/20 0537  NA 135  140 141 143 141  K 3.5 3.7 3.8 3.8 3.9  CL 102 106 105 106 101  CO2 27 28 26 30  34*  GLUCOSE 96 114* 107* 109* 120*  BUN 14 9 9 9 10   CREATININE 0.57 0.53 0.50 0.50 0.46  CALCIUM 6.8* 7.0* 7.3* 7.3* 7.7*  MG  --   --   --  1.7 1.7   GFR: Estimated Creatinine Clearance: 53.7 mL/min (by C-G formula based on SCr of 0.46 mg/dL). Liver Function Tests: Recent Labs  Lab 07/04/20 0345 07/05/20 0437 07/06/20 0510 07/07/20 0544 07/08/20 0340  AST 130* 81* 64* 52* 37  ALT 59* 41 35 31 26  ALKPHOS 175* 164* 166* 161* 156*  BILITOT 0.8 0.9 0.9 0.8 0.7  PROT 5.4* 4.7* 5.0* 5.1* 4.8*  ALBUMIN 2.8* 2.4* 2.5* 2.6* 2.6*   No results for input(s): LIPASE, AMYLASE in the last 168 hours. Recent Labs  Lab 07/02/20 1623  AMMONIA 26  CBG: Recent Labs  Lab 07/08/20 0754 07/08/20 0824 07/08/20 1205 07/09/20 0753  GLUCAP 108* 106* 115* 103*   Lipid Profile: No results for input(s): CHOL, HDL, LDLCALC, TRIG, CHOLHDL, LDLDIRECT in the last 72 hours. Thyroid Function Tests: Recent Labs    07/08/20 0340  TSH 0.330*   Anemia Panel: No results for input(s): VITAMINB12, FOLATE, FERRITIN, TIBC, IRON, RETICCTPCT in the last 72 hours. Sepsis Labs: Recent Labs  Lab 07/06/20 1703 07/07/20 0544 07/08/20 0340  PROCALCITON <0.10 <0.10 <0.10    Recent Results (from the past 240 hour(s))  Body fluid culture     Status: None   Collection Time: 07/04/20 11:12 AM   Specimen: Lung, Right; Pleural Fluid  Result Value Ref Range Status   Specimen Description   Final    PLEURAL Performed at [facility], [place] [address] [place], [place] [address]    Special Requests   Final    NONE Performed at [facility], [place] [address], [place], [place] [address]    Gram Stain   Final    FEW WBC PRESENT, PREDOMINANTLY MONONUCLEAR NO ORGANISMS SEEN    Culture   Final    NO GROWTH 3 DAYS Performed at [place] [address], [place], [place] [address]     Report Status 07/07/2020 FINAL  Final  Gram stain     Status: None   Collection  Time: 07/08/20  5:03 PM   Specimen: Fluid  Result Value Ref Range Status   Specimen Description FLUID PLEURAL  Final   Special Requests   Final    BOTTLES DRAWN AEROBIC AND ANAEROBIC Blood Culture adequate volume   Gram Stain   Final    RARE WBC PRESENT, PREDOMINANTLY MONONUCLEAR NO ORGANISMS SEEN Performed at [facility], [address], [place], [place] [address]    Report Status 07/08/2020 FINAL  Final      Radiology Studies: DG Chest 1 View  Result Date: 07/08/2020 CLINICAL DATA:  86 year old female status post right thoracentesis. EXAM: CHEST  1 VIEW COMPARISON:  Chest radiograph dated 07/06/2020. FINDINGS: Right-sided Port-A-Cath in similar position. Bilateral patchy airspace opacities, right greater left with interval progression compared to prior radiograph may represent edema or pneumonia. Clinical correlation is recommended. Small bilateral pleural effusions. No pneumothorax. Stable cardiac silhouette. Atherosclerotic calcification of the aorta. No acute osseous pathology. IMPRESSION: 1. Small bilateral pleural effusions.  No pneumothorax. 2. Bilateral airspace opacities, progressed since the prior radiograph. Electronically Signed   By: Anner Crete M.D.   On: 07/08/2020 18:26   DG Chest 2 View  Result Date: 07/09/2020 CLINICAL DATA:  Follow-up pleural effusion. EXAM: CHEST - 2 VIEW COMPARISON:  07/08/20 FINDINGS: Right chest wall port a catheter is noted with tip projecting over the cavoatrial junction. Interval increase in volume of right pleural effusion. Mild diffuse pulmonary edema. Bilateral airspace opacities are noted within the left upper lobe, right midlung and right base concerning for multifocal pneumonia. IMPRESSION: 1. Increase in volume of right pleural effusion with mild interstitial edema 2. Bilateral airspace opacities concerning for multifocal pneumonia. Electronically  Signed   By: Kerby Moors M.D.   On: 07/09/2020 11:21   CT ANGIO CHEST PE W OR WO CONTRAST  Result Date: 07/07/2020 CLINICAL DATA:  Lymphoma, recurrent right pleural effusion bloody tap EXAM: CT ANGIOGRAPHY CHEST WITH CONTRAST TECHNIQUE: Multidetector CT imaging of the chest was performed using the standard protocol during bolus administration of intravenous contrast. Multiplanar CT image reconstructions and MIPs were obtained to evaluate the vascular anatomy. CONTRAST:  198mL OMNIPAQUE IOHEXOL 350 MG/ML SOLN COMPARISON:  None. FINDINGS: Cardiovascular: Aortic atherosclerosis. Normal heart size. Central pulmonary arteries are patent. Mediastinum/Nodes: Adenopathy is identified in the supraclavicular region, bilateral axilla, and mediastinum. Nodes appear and measures slightly smaller in size. Lungs/Pleura: Moderate right and mild to moderate left pleural effusions measuring simple fluid in density. Imaging performed during expiration. Persistent atelectasis at the left lung base. New atelectasis/consolidation at the right lung base. Interlobular septal thickening the non dependent right lung may reflect asymmetric edema. Upper Abdomen: Ascites. Unchanged ill-defined hypoattenuating lesion in the superior spleen. Musculoskeletal: No new abnormality. Review of the MIP images confirms the above findings. IMPRESSION: Moderate right and mild to moderate left pleural effusions. Right basilar atelectasis/consolidation and left basilar atelectasis. Interlobular septal thickening in the right lung may reflect asymmetric edema. Multifocal adenopathy appears stable to slightly improved. Electronically Signed   By: Macy Mis M.D.   On: 07/07/2020 14:33   ECHOCARDIOGRAM COMPLETE  Result Date: 07/07/2020    ECHOCARDIOGRAM REPORT   Patient Name:   Tracy Mclaughlin Date of Exam: 07/07/2020 Medical Rec #:  [id number]            Height:       68.0 in Accession #:    [id number]           Weight:  174.0 lb Date of  Birth:  [birth year]           BSA:          1.926 m Patient Age:    85 years             BP:           132/65 mmHg Patient Gender: F                    HR:           855 bpm. Exam Location:  Inpatient Procedure: 2D Echo Indications:    Atrial tachycardia [facility]) [[id number]]  History:        Patient has prior history of Echocardiogram examinations, most                 recent 05/31/2019. Risk Factors:Hypertension. Lymphoma , thyroid                 disease, thyroid cancer. Past history of afib.  Sonographer:    Darlina Sicilian RDCS Referring Phys: [place]  1. Left ventricular ejection fraction, by estimation, is 60 to 65%. The left ventricle has normal function. The left ventricle has no regional wall motion abnormalities. Left ventricular diastolic parameters were normal.  2. Right ventricular systolic function is normal. The right ventricular size is normal.  3. Moderate pleural effusion in both left and right lateral regions.  4. The mitral valve is normal in structure. Trivial mitral valve regurgitation. No evidence of mitral stenosis.  5. The aortic valve is tricuspid. Aortic valve regurgitation is not visualized. No aortic stenosis is present.  6. The inferior vena cava is normal in size with <50% respiratory variability, suggesting right atrial pressure of 8 mmHg. Conclusion(s)/Recommendation(s): Normal biventricular function without evidence of hemodynamically significant valvular heart disease. FINDINGS  Left Ventricle: Left ventricular ejection fraction, by estimation, is 60 to 65%. The left ventricle has normal function. The left ventricle has no regional wall motion abnormalities. The left ventricular internal cavity size was normal in size. There is  no left ventricular hypertrophy. Left ventricular diastolic parameters were normal. Right Ventricle: The right ventricular size is normal. No increase in right ventricular wall thickness. Right ventricular systolic function is normal. Left  Atrium: Left atrial size was normal in size. Right Atrium: Right atrial size was normal in size. Pericardium: Trivial pericardial effusion is present. Mitral Valve: The mitral valve is normal in structure. Trivial mitral valve regurgitation. No evidence of mitral valve stenosis. Tricuspid Valve: The tricuspid valve is normal in structure. Tricuspid valve regurgitation is mild . No evidence of tricuspid stenosis. Aortic Valve: The aortic valve is tricuspid. Aortic valve regurgitation is not visualized. No aortic stenosis is present. Pulmonic Valve: The pulmonic valve was grossly normal. Pulmonic valve regurgitation is trivial. Aorta: The aortic root, ascending aorta and aortic arch are all structurally normal, with no evidence of dilitation or obstruction. Venous: The inferior vena cava is normal in size with less than 50% respiratory variability, suggesting right atrial pressure of 8 mmHg. IAS/Shunts: No atrial level shunt detected by color flow Doppler. Additional Comments: There is a moderate pleural effusion in both left and right lateral regions.  LEFT VENTRICLE PLAX 2D LVIDd:         5.10 cm  Diastology LVIDs:         3.30 cm  LV e' lateral:   8.27 cm/s LV PW:  0.90 cm  LV E/e' lateral: 9.2 LV IVS:        0.70 cm  LV e' medial:    6.64 cm/s LVOT diam:     1.80 cm  LV E/e' medial:  11.5 LV SV:         30 LV SV Index:   15 LVOT Area:     2.54 cm  RIGHT VENTRICLE RV S prime:     14.80 cm/s TAPSE (M-mode): 2.4 cm LEFT ATRIUM           Index       RIGHT ATRIUM           Index LA diam:      3.00 cm 1.56 cm/m  RA Area:     13.80 cm LA Vol (A2C): 44.7 ml 23.21 ml/m RA Volume:   33.50 ml  17.39 ml/m LA Vol (A4C): 42.6 ml 22.12 ml/m  AORTIC VALVE LVOT Vmax:   77.70 cm/s LVOT Vmean:  49.700 cm/s LVOT VTI:    0.116 m  AORTA Ao Root diam: 3.00 cm MITRAL VALVE MV Area (PHT): 4.96 cm    SHUNTS MV Decel Time: 153 msec    Systemic VTI:  0.12 m MV E velocity: 76.30 cm/s  Systemic Diam: 1.80 cm MV A velocity: 78.80  cm/s MV E/A ratio:  0.97 Buford Dresser MD Electronically signed by Buford Dresser MD Signature Date/Time: 07/07/2020/5:02:22 PM    Final    US THORACENTESIS ASP PLEURAL SPACE W/IMG GUIDE  Result Date: 07/08/2020 INDICATION: Lymphoma, remote colon and thyroid cancers, dyspnea, right pleural effusion.request for diagnostic and therapeutic thoracentesis. EXAM: ULTRASOUND GUIDED RIGHT THORACENTESIS MEDICATIONS: 1% lidocaine 10 mL COMPLICATIONS: None immediate. PROCEDURE: An ultrasound guided thoracentesis was thoroughly discussed with the patient and questions answered. The benefits, risks, alternatives and complications were also discussed. The patient understands and wishes to proceed with the procedure. Written consent was obtained. Ultrasound was performed to localize and mark an adequate pocket of fluid in the right chest. The area was then prepped and draped in the normal sterile fashion. 1% Lidocaine was used for local anesthesia. Under ultrasound guidance a 6 Fr Safe-T-Centesis catheter was introduced. Thoracentesis was performed. The catheter was removed and a dressing applied. FINDINGS: A total of approximately 500 mL of red fluid was removed. Samples were sent to the laboratory as requested by the clinical team. IMPRESSION: Successful ultrasound guided right thoracentesis yielding 500 mL of pleural fluid. Read by: Gareth Eagle, PA-C Electronically Signed   By: Jerilynn Mages.  Shick M.D.   On: 07/08/2020 16:31     LOS: 20 days   Deatra James, MD [facility]  07/09/2020, 12:44 PM

## 2020-07-10 DIAGNOSIS — J9602 Acute respiratory failure with hypercapnia: Secondary | ICD-10-CM | POA: Diagnosis present

## 2020-07-10 DIAGNOSIS — C8510 Unspecified B-cell lymphoma, unspecified site: Secondary | ICD-10-CM | POA: Diagnosis not present

## 2020-07-10 DIAGNOSIS — J90 Pleural effusion, not elsewhere classified: Secondary | ICD-10-CM | POA: Diagnosis present

## 2020-07-10 LAB — CBC
HCT: 33.1 % — ABNORMAL LOW (ref 36.0–46.0)
Hemoglobin: 10.8 g/dL — ABNORMAL LOW (ref 12.0–15.0)
MCH: 34.5 pg — ABNORMAL HIGH (ref 26.0–34.0)
MCHC: 32.6 g/dL (ref 30.0–36.0)
MCV: 105.8 fL — ABNORMAL HIGH (ref 80.0–100.0)
Platelets: 125 10*3/uL — ABNORMAL LOW (ref 150–400)
RBC: 3.13 MIL/uL — ABNORMAL LOW (ref 3.87–5.11)
RDW: 14.4 % (ref 11.5–15.5)
WBC: 3.9 10*3/uL — ABNORMAL LOW (ref 4.0–10.5)
nRBC: 0 % (ref 0.0–0.2)

## 2020-07-10 LAB — BASIC METABOLIC PANEL
Anion gap: 11 (ref 5–15)
BUN: 10 mg/dL (ref 8–23)
CO2: 36 mmol/L — ABNORMAL HIGH (ref 22–32)
Calcium: 7.6 mg/dL — ABNORMAL LOW (ref 8.9–10.3)
Chloride: 96 mmol/L — ABNORMAL LOW (ref 98–111)
Creatinine, Ser: 0.51 mg/dL (ref 0.44–1.00)
GFR calc Af Amer: >60 mL/min (ref >60)
GFR calc non Af Amer: >60 mL/min (ref >60)
Glucose, Bld: 103 mg/dL — ABNORMAL HIGH (ref 70–99)
Potassium: 3.1 mmol/L — ABNORMAL LOW (ref 3.5–5.1)
Sodium: 143 mmol/L (ref 135–145)

## 2020-07-10 LAB — PH, BODY FLUID: pH, Body Fluid: 7.4

## 2020-07-10 LAB — CYTOLOGY - NON PAP

## 2020-07-10 MED ORDER — PIPERACILLIN-TAZOBACTAM 3.375 G IVPB
3.3750 g | Freq: Three times a day (TID) | INTRAVENOUS | Status: DC
  Administered 2020-07-10 – 2020-07-15 (×15): 3.375 g via INTRAVENOUS
  Filled 2020-07-10 (×16): qty 50

## 2020-07-10 MED ORDER — POTASSIUM CHLORIDE CRYS ER 20 MEQ PO TBCR
40.0000 meq | EXTENDED_RELEASE_TABLET | Freq: Once | ORAL | Status: AC
  Administered 2020-07-10: 40 meq via ORAL
  Filled 2020-07-10: qty 2

## 2020-07-10 MED ORDER — VANCOMYCIN HCL 750 MG/150ML IV SOLN
750.0000 mg | Freq: Two times a day (BID) | INTRAVENOUS | Status: DC
  Administered 2020-07-10 – 2020-07-16 (×13): 750 mg via INTRAVENOUS
  Filled 2020-07-10 (×14): qty 150

## 2020-07-10 NOTE — Progress Notes (Signed)
PROGRESS NOTE   Tracy Mclaughlin  DZH:[id number] DOB: 1934/04/28 DOA: 06/23/2020 PCP: [name], PA   Chef Complaints:  Nausea, vomiting and poor po intake.  Brief Narrative: 86 y.o.femalewith medical history significant forlow-grade lymphoma without significant response to chemotherapy last year and was recently started onBTK INHIBITORby Dr. [name], hypothyroidism, history of A. fib, pancytopenia presents to the ED with3-4 day history of nausea vomiting and diarrhea and abdominal pain-and right upper quadrant.symptomts started after new chemo on Thursday.Patient denies any fever chest pain.vomitted x3 today and had non bloody diarrhea 4 times today.  ED Course:Hemodynamically stable, afebrile, UA positive for UTI,CT scan abdomen done that showed free fluid with ascites and mesenteric edema, case was discussed Dr. [name] from oncology advised UTI treatment with Zosyn and he will see the patient.  Patient  Was admitted, being managed symptomatically chemo was held diarrhea improved subsequently oncology put the patient back on chemo overall tolerating well. Having nausea anorexia suspecting this is related to her lymphoma. Patient has been started on prednisone to help with appetite and also received trazodone for sleep. Patient remains deconditioned.  She was found to get more sleepy and confused subsequently trazodone and prednisone were discontinued with resolution of her confusion.  Patient was also noticed to have shortness of breath/dyspnea with activity-imaging studies showed pleural effusion underwent right-sided thoracentesis with 1.6 L of blood-tinged fluid removal, cytology reactive medically stable, no growth on culture. PT had suggested skilled nursing facility patient had PICC line SNF as well as home health.  Patient went into rapid tachycardia supraventricular 7/19 a.m. and also with hypoxia-underwent CTA chest no PE but right-sided pleural effusion  atelectasis/consolidation-cardiologist consulted.  Patient converted to sinus rhythm spontaneously. received few days of IV antibiotics , Procalcitonin has been negative/normal- no fever no leukocytosis so stopped  7/20.  ------------------------------------------------------------------------------------------------------------------------------------------   Subjective:  The patient was seen and examined this morning, stable, awake alert but lethargic.   Still on 4 L of oxygen, satting 97%    Notified diet pleural fluid cultures are growing gram-positive cocci  Assessment & Plan:  Acute hypoxic respiratory failure due to pleural effusion/no PE on the CT. -Still requiring 4 L of oxygen, satting 97%, tachypneic with respiratory rate of 22 T-max 97.7, heart rate 73 WBC of 3.9, procalcitonin< 0.1 on 07/09/2020 -Pulmonary pleural cultures are growing gram-positive cocci -Initiating broad-spectrum antibiotics of Vanco and Zosyn -we will modify and narrow down antibiotics according to final cultures and sensitivity.   -Remains in respiratory distress O2 dependent, hypoxic worsening shortness of breath with minimal exertion - Recurrent pleural effusion -07/08/2020 US guided right thoracentesis. Yielded 500 mL of red fluid.   CXR this AM 07/09/20 IMPRESSION: 1. Increase in volume of right pleural effusion with mild interstitial edema 2. Bilateral airspace opacities concerning for multifocal pneumonia.   -Continue gentle diuretics -As needed duo nebs -We will continue empiric antibiotics -Low suspicion for pneumonia, sepsis   Intractable nausea vomitingdiarrhea andabdominal pain:  -Improved no further episodes overnight Unclear etiology suspecting due to lymphoma.  - Acalabrutinib was held on admission and resumed 7/8. Diarrhea improved.   Acalabrutinib  was again discontinued as her LFTs were worsening. Has new onset ascites on the CT scan-but too small to tap on the ultrasound .  Cont supportive measure.  - Prednisone was added to stimulate appetite but discontinued due to confusion  Recurrent pleural effusion: -Monitoring closely  ? etiology ? due to lymphoma,? 2/2 Acalabrutinib ( less likely per oncology) . S/p rt thoracentesis  1.6 L blood-tinged fluid was removed.  Pleural fluid Gram stain no organism, culture no growth so far, cytology reactive mesothelial cell.   CTA shows moderate right and mild to moderate left pleural effusions-BNP around 400  (Less likely fluid was from a CHF)  -Echo shows preserved EJF 60 to 65%.  no diastolic dysfunction, unclear if related to CHF versus low Albumin.   Cardiology on board will start trial of IV Lasix low-dose, I's and O's: +10.1 L  - order US guided thoracentesis along with labs/cytology,discussed with cardio and  Oncology.   Narrow complex tachycardia 07/07/20 am- pt moved to tele.   Converted to sinus rhythm.  No recurrent episode -remained stable-Cardiology following closely -Continue diltiazem CD 180 nightly, Lopressor 12.5 mg p.o. twice daily, -Following thyroid function -2D echocardiogram reviewed   Ascites: -new onset ascites on the CT scan-too small to tap on paracentesis per IR.     Abnormal LFTS: 2/2 Acalabrutinib-which is discontinued.  LFTs improving.RUQ [place]- possible cirrhotic changes, hepatitis panel negative.   Acute metabolic encephalopathy from polypharmacy: -Mentation back to baseline today  had  confusion/Lethargy: 7/14 a.m likley polypharmacy and pt feels improved after stopping trazodone/prednisone.   -Continue to complain of insomnia, sleeping medication as needed   She is on Xanax low-dose continue for anxiety.  Continue supportive care.    Hypokalemia.:  Monitoring and repeating accordingly today 40 mEq will be given.   Recent Labs  Lab 07/06/20 0510 07/07/20 0544 07/08/20 0340 07/09/20 0537 07/10/20 0322  K 3.7 3.8 3.8 3.9 3.1*   E coli UTI HBZ:JIRCVELFY 7 days of antibiotics.    Non Hodgkin's lymphoma -Remained stable, -Oncology following -with progressive decline, patientwasstartedpast weekonAcalabrutinib she did not have significant response to chemotherapy last year.  Acalabrutinib was held on admission and resumed  But again stopped due to worsening LFTs by oncology.   Failure to thrive/deconditioning: Remains weak and deconditioned. She refused SNF.  She is high risk for readmission.  Husband concerned about poor support.  Hypertension: BP is controlled.  Continue Cardizem.    Pancytopenia with anemia/Thrombocytopenia in the setting of lymphoma/chemo: -Improving -Hemoglobin and WBC count overall stable.  Monitor.Heparin was discontinued by hematology due to bilateral lower leg bruise and thrombocytopenia.   Recent Labs  Lab 07/08/20 0340 07/09/20 0537 07/10/20 0322  HGB 10.5* 11.0* 10.8*  HCT 33.4* 33.9* 33.1*  WBC 3.7* 4.5 3.9*  PLT 117* 130* 125*   History of intermittent atrial fibrillation on Cardizem: Not on anticoagulation due to history of subarachnoid hemorrhage as per cardiology.  Bruise on the skin heparin discontinued per oncology. improving.     DVT prophylaxis: Place and maintain sequential compression device Start: 06/30/20 0901 Heparin stopped due to  extensive LE bruise 7/12 and low platelet counts by hematology. Code Status: DNR Family Communication: Discussed with patient today.  Yesterday had extensive discussion with the patient's husband.  Status is: Inpatient Remains inpatient appropriate because: Patient remains deconditioned weak, hypoxic.  Dispo: The patient is from: Home               Anticipated d/c is to: SNF as per PT- pt declines SNF and HH.  Continued to engage on placement receomendations.              Anticipated d/c date is: 2 days.  Once shortness of better, and cleared by her oncologist.discussed with her oncology               Patient currently not medically stable.  Nutrition: Diet Order             Diet regular Room service appropriate? Yes; Fluid consistency: Thin  Diet effective now                Body mass index is 25.54 kg/m.  Consultants:see note  Procedures:see note Microbiology:see note  Medications: Scheduled Meds:  Chlorhexidine Gluconate Cloth  6 each Topical Daily   citalopram  20 mg Oral Daily   diltiazem  180 mg Oral QHS   feeding supplement (KATE FARMS STANDARD 1.4)  325 mL Oral BID BM   feeding supplement (PRO-STAT SUGAR FREE 64)  30 mL Oral Daily   furosemide  20 mg Intravenous BID   heparin lock flush  500 Units Intracatheter Q30 days   levothyroxine  224 mcg Oral Q0600   melatonin  3 mg Oral QHS   metoprolol tartrate  12.5 mg Oral BID   multivitamin with minerals  1 tablet Oral Daily   pantoprazole  40 mg Oral Daily   Continuous Infusions:  sodium chloride 10 mL/hr at 07/07/20 0945    Antimicrobials: Anti-infectives (From admission, onward)   Start     Dose/Rate Route Frequency Ordered Stop   07/06/20 1800  cefTRIAXone (ROCEPHIN) 1 g in sodium chloride 0.9 % 100 mL IVPB  Status:  Discontinued        1 g 200 mL/hr over 30 Minutes Intravenous Every 24 hours 07/06/20 1646 07/08/20 1037   07/06/20 1800  azithromycin (ZITHROMAX) tablet 500 mg  Status:  Discontinued        500 mg Oral Daily 07/06/20 1646 07/08/20 1037   06/27/20 0830  cefTRIAXone (ROCEPHIN) 1 g in sodium chloride 0.9 % 100 mL IVPB  Status:  Discontinued        1 g 200 mL/hr over 30 Minutes Intravenous Every 24 hours 06/27/20 0733 06/30/20 1340   06/24/20 0200  piperacillin-tazobactam (ZOSYN) IVPB 3.375 g  Status:  Discontinued        3.375 g 12.5 mL/hr over 240 Minutes Intravenous Every 8 hours 06/23/20 1703 06/27/20 0733   06/23/20 1645  piperacillin-tazobactam (ZOSYN) IVPB 3.375 g        3.375 g 100 mL/hr over 30 Minutes Intravenous  Once 06/23/20 1638 06/23/20 1834   06/23/20 1615  cefTRIAXone (ROCEPHIN) 1 g in sodium chloride 0.9 % 100 mL IVPB  Status:   Discontinued        1 g 200 mL/hr over 30 Minutes Intravenous  Once 06/23/20 1607 06/23/20 1608       Objective: Vitals: Today's Vitals   07/10/20 0447 07/10/20 0800 07/10/20 0835 07/10/20 1323  BP: (!) 115/52 (!) 137/124 (!) 139/54   Pulse: 73 78 73   Resp: 20 (!) 30 (!) 22   Temp: 97.8 F (36.6 C)  98.1 F (36.7 C) 97.7 F (36.5 C)  TempSrc: Oral  Oral Oral  SpO2: 98% 98% 97%   Weight:      Height:      PainSc:   0-No pain     Intake/Output Summary (Last 24 hours) at 07/10/2020 1326 Last data filed at 07/10/2020 1100 Gross per 24 hour  Intake 90 ml  Output 1325 ml  Net -1235 ml   Filed Weights   06/23/20 1206 07/08/20 1800  Weight: 78.9 kg 76.2 kg   Weight change:    Intake/Output from previous day: 07/21 0701 - 07/22 0700 In: 180 [P.O.:180] Out: 825 [Urine:825] Intake/Output this shift: Total I/O In: 30 [P.O.:30]  Out: 600 [Urine:600]   Physical Exam  Constitution:  Alert, cooperative, lethargic, Psychiatric: Withdrawn speaks very very few words  HEENT: Normocephalic, PERRL, otherwise with in Normal limits  Chest:Chest symmetric Cardio vascular:  S1/S2, RRR, No murmure, No Rubs or Gallops  pulmonary: Nonlabored breathing, crackles, diminished breath sounds right lower lobe greater left  negative wheezes / crackles Abdomen: Soft, non-tender, non-distended, bowel sounds,no masses, no organomegaly Muscular skeletal:  Symmetric bilateral generalized weaknesses Limited exam - in bed, able to move all 4 extremities, Normal strength,  Neuro: CNII-XII intact. , normal motor and sensation, reflexes intact  Extremities: No pitting edema lower extremities, +2 pulses  Skin: Dry, warm to touch, negative for any Rashes, No open wounds Wounds: per nursing documentation            Data Reviewed: I have personally reviewed following labs and imaging studies CBC: Recent Labs  Lab 07/06/20 0510 07/07/20 0544 07/08/20 0340 07/09/20 0537 07/10/20 0322  WBC  3.2* 4.2 3.7* 4.5 3.9*  HGB 10.9* 11.5* 10.5* 11.0* 10.8*  HCT 33.8* 36.0 33.4* 33.9* 33.1*  MCV 107.6* 107.1* 108.1* 105.3* 105.8*  PLT 87* 111* 117* 130* 409*   Basic Metabolic Panel: Recent Labs  Lab 07/06/20 0510 07/07/20 0544 07/08/20 0340 07/09/20 0537 07/10/20 0322  NA 140 141 143 141 143  K 3.7 3.8 3.8 3.9 3.1*  CL 106 105 106 101 96*  CO2 28 26 30  34* 36*  GLUCOSE 114* 107* 109* 120* 103*  BUN 9 9 9 10 10   CREATININE 0.53 0.50 0.50 0.46 0.51  CALCIUM 7.0* 7.3* 7.3* 7.7* 7.6*  MG  --   --  1.7 1.7  --    GFR: Estimated Creatinine Clearance: 53.7 mL/min (by C-G formula based on SCr of 0.51 mg/dL). Liver Function Tests: Recent Labs  Lab 07/04/20 0345 07/05/20 0437 07/06/20 0510 07/07/20 0544 07/08/20 0340  AST 130* 81* 64* 52* 37  ALT 59* 41 35 31 26  ALKPHOS 175* 164* 166* 161* 156*  BILITOT 0.8 0.9 0.9 0.8 0.7  PROT 5.4* 4.7* 5.0* 5.1* 4.8*  ALBUMIN 2.8* 2.4* 2.5* 2.6* 2.6*   No results for input(s): LIPASE, AMYLASE in the last 168 hours. No results for input(s): AMMONIA in the last 168 hours.CBG: Recent Labs  Lab 07/08/20 0754 07/08/20 0824 07/08/20 1205 07/09/20 0753 07/09/20 1632  GLUCAP 108* 106* 115* 103* 100*   Lipid Profile: No results for input(s): CHOL, HDL, LDLCALC, TRIG, CHOLHDL, LDLDIRECT in the last 72 hours. Thyroid Function Tests: Recent Labs    07/08/20 0340  TSH 0.330*   Anemia Panel: No results for input(s): VITAMINB12, FOLATE, FERRITIN, TIBC, IRON, RETICCTPCT in the last 72 hours. Sepsis Labs: Recent Labs  Lab 07/06/20 1703 07/07/20 0544 07/08/20 0340 07/09/20 1559  PROCALCITON <0.10 <0.10 <0.10 <0.10    Recent Results (from the past 240 hour(s))  Body fluid culture     Status: None   Collection Time: 07/04/20 11:12 AM   Specimen: Lung, Right; Pleural Fluid  Result Value Ref Range Status   Specimen Description   Final    PLEURAL Performed at [facility], [place] [address], [place], [place]  [address]    Special Requests   Final    NONE Performed at [facility], [place] [address] [place], [place] [address]    Gram Stain   Final    FEW WBC PRESENT, PREDOMINANTLY MONONUCLEAR NO ORGANISMS SEEN    Culture   Final    NO GROWTH  3 DAYS Performed at [facility], [place] [address], [place], [place] [address]    Report Status 07/07/2020 FINAL  Final  Gram stain     Status: None   Collection Time: 07/08/20  5:03 PM   Specimen: Fluid  Result Value Ref Range Status   Specimen Description FLUID PLEURAL  Final   Special Requests   Final    BOTTLES DRAWN AEROBIC AND ANAEROBIC Blood Culture adequate volume   Gram Stain   Final    RARE WBC PRESENT, PREDOMINANTLY MONONUCLEAR NO ORGANISMS SEEN Performed at [facility], [place] [address], [place], [place] [address]    Report Status 07/08/2020 FINAL  Final  Culture, body fluid-bottle     Status: None (Preliminary result)   Collection Time: 07/08/20  5:04 PM   Specimen: Fluid  Result Value Ref Range Status   Specimen Description FLUID PLEURAL  Final   Special Requests   Final    BOTTLES DRAWN AEROBIC AND ANAEROBIC Blood Culture adequate volume   Gram Stain   Final    GRAM POSITIVE COCCI IN CLUSTERS AEROBIC BOTTLE ONLY CRITICAL RESULT CALLED TO, READ BACK BY AND VERIFIED WITH: RN RUTH M. [id number] FCP Performed at [facility], [address] [place], [place] [address]    Culture GRAM POSITIVE COCCI  Final   Report Status PENDING  Incomplete      Radiology Studies: DG Chest 1 View  Result Date: 07/08/2020 CLINICAL DATA:  86 year old female status post right thoracentesis. EXAM: CHEST  1 VIEW COMPARISON:  Chest radiograph dated 07/06/2020. FINDINGS: Right-sided Port-A-Cath in similar position. Bilateral patchy airspace opacities, right greater left with interval progression compared to prior radiograph may represent edema or pneumonia. Clinical correlation is recommended. Small bilateral pleural  effusions. No pneumothorax. Stable cardiac silhouette. Atherosclerotic calcification of the aorta. No acute osseous pathology. IMPRESSION: 1. Small bilateral pleural effusions.  No pneumothorax. 2. Bilateral airspace opacities, progressed since the prior radiograph. Electronically Signed   By: Anner Crete M.D.   On: 07/08/2020 18:26   DG Chest 2 View  Result Date: 07/09/2020 CLINICAL DATA:  Follow-up pleural effusion. EXAM: CHEST - 2 VIEW COMPARISON:  07/08/20 FINDINGS: Right chest wall port a catheter is noted with tip projecting over the cavoatrial junction. Interval increase in volume of right pleural effusion. Mild diffuse pulmonary edema. Bilateral airspace opacities are noted within the left upper lobe, right midlung and right base concerning for multifocal pneumonia. IMPRESSION: 1. Increase in volume of right pleural effusion with mild interstitial edema 2. Bilateral airspace opacities concerning for multifocal pneumonia. Electronically Signed   By: Kerby Moors M.D.   On: 07/09/2020 11:21   US THORACENTESIS ASP PLEURAL SPACE W/IMG GUIDE  Result Date: 07/08/2020 INDICATION: Lymphoma, remote colon and thyroid cancers, dyspnea, right pleural effusion.request for diagnostic and therapeutic thoracentesis. EXAM: ULTRASOUND GUIDED RIGHT THORACENTESIS MEDICATIONS: 1% lidocaine 10 mL COMPLICATIONS: None immediate. PROCEDURE: An ultrasound guided thoracentesis was thoroughly discussed with the patient and questions answered. The benefits, risks, alternatives and complications were also discussed. The patient understands and wishes to proceed with the procedure. Written consent was obtained. Ultrasound was performed to localize and mark an adequate pocket of fluid in the right chest. The area was then prepped and draped in the normal sterile fashion. 1% Lidocaine was used for local anesthesia. Under ultrasound guidance a 6 Fr Safe-T-Centesis catheter was introduced. Thoracentesis was performed. The  catheter was removed and a dressing applied. FINDINGS: A total of approximately  500 mL of red fluid was removed. Samples were sent to the laboratory as requested by the clinical team. IMPRESSION: Successful ultrasound guided right thoracentesis yielding 500 mL of pleural fluid. Read by: Gareth Eagle, PA-C Electronically Signed   By: Jerilynn Mages.  Shick M.D.   On: 07/08/2020 16:31     LOS: 50 days   Deatra James, MD [facility]  07/10/2020, 1:26 PM

## 2020-07-10 NOTE — Progress Notes (Signed)
Pharmacy Antibiotic Note  Tracy Mclaughlin is a 86 y.o. female admitted on 06/23/2020 with GPC initially in pleural fluid culture.  Pharmacy has been consulted for vanc/zosyn dosing with plan to narrow per Cx results as appropriate  Plan:  Vanc 750mg  IV q12  Zosyn 3.375g IV q8 (extended interval infusion)  Daily SCr  Height: 5\' 8"  (172.7 cm) Weight: 76.2 kg (167 lb 15.9 oz) IBW/kg (Calculated) : 63.9  Temp (24hrs), Avg:97.8 F (36.6 C), Min:97.7 F (36.5 C), Max:98.1 F (36.7 C)  Recent Labs  Lab 07/06/20 0510 07/07/20 0544 07/08/20 0340 07/09/20 0537 07/10/20 0322  WBC 3.2* 4.2 3.7* 4.5 3.9*  CREATININE 0.53 0.50 0.50 0.46 0.51    Estimated Creatinine Clearance: 53.7 mL/min (by C-G formula based on SCr of 0.51 mg/dL).    Allergies  Allergen Reactions  . Demerol [Meperidine Hcl] Nausea And Vomiting    Thank you for allowing pharmacy to be a part of this patient's care.  Kara Mead 07/10/2020 1:30 PM

## 2020-07-10 NOTE — Progress Notes (Signed)
Report received from Almon Hercules.  No change in assessment. Pt resting comfortably.  Will continue to monitor. Will continue plan of care. Cherylee Rawlinson, Laurel Dimmer, RN

## 2020-07-10 NOTE — Progress Notes (Addendum)
HEMATOLOGY-ONCOLOGY PROGRESS NOTE  SUBJECTIVE: Continues to have dyspnea.  Remains on O2 at 4 L/min via nasal cannula.  Has been seen by PCCM who has recommended diuresis.  They are awaiting flow cytometry from recent thoracentesis.  Oncology History  Non Hodgkin's lymphoma ([place])  09/29/2016 Initial Diagnosis   Non Hodgkin's lymphoma ([place])   03/22/2019 - 04/23/2019 Chemotherapy   The patient had palonosetron (ALOXI) injection 0.25 mg, 0.25 mg, Intravenous,  Once, 1 of 4 cycles Administration: 0.25 mg (03/22/2019) riTUXimab (RITUXAN) 800 mg in sodium chloride 0.9 % 250 mL (2.4242 mg/mL) infusion, 375 mg/m2 = 800 mg, Intravenous,  Once, 1 of 4 cycles Administration: 800 mg (03/22/2019) bendamustine (BENDEKA) 150 mg in sodium chloride 0.9 % 50 mL (2.6786 mg/mL) chemo infusion, 70 mg/m2 = 150 mg (100 % of original dose 70 mg/m2), Intravenous,  Once, 1 of 4 cycles Dose modification: 70 mg/m2 (original dose 70 mg/m2, Cycle 1, Reason: Provider Judgment) Administration: 150 mg (03/22/2019), 150 mg (03/23/2019)  for chemotherapy treatment.    05/18/2019 - 06/28/2019 Chemotherapy   The patient had DOXOrubicin (ADRIAMYCIN) chemo injection 82 mg, 40 mg/m2 = 82 mg (100 % of original dose 40 mg/m2), Intravenous,  Once, 2 of 6 cycles Dose modification: 40 mg/m2 (original dose 40 mg/m2, Cycle 1, Reason: Provider Judgment) Administration: 82 mg (05/18/2019), 82 mg (06/08/2019) palonosetron (ALOXI) injection 0.25 mg, 0.25 mg, Intravenous,  Once, 2 of 6 cycles Administration: 0.25 mg (05/18/2019), 0.25 mg (06/08/2019) pegfilgrastim (NEULASTA ONPRO KIT) injection 6 mg, 6 mg, Subcutaneous, Once, 2 of 6 cycles Administration: 6 mg (05/18/2019), 6 mg (06/08/2019) vinCRIStine (ONCOVIN) 1 mg in sodium chloride 0.9 % 50 mL chemo infusion, 1 mg (100 % of original dose 1 mg), Intravenous,  Once, 2 of 6 cycles Dose modification: 1 mg (original dose 1 mg, Cycle 1, Reason: Provider Judgment) Administration: 1 mg (05/18/2019), 1 mg  (06/08/2019) riTUXimab (RITUXAN) 800 mg in sodium chloride 0.9 % 250 mL (2.4242 mg/mL) infusion, 375 mg/m2 = 800 mg, Intravenous,  Once, 2 of 6 cycles Administration: 800 mg (05/18/2019), 800 mg (06/08/2019) cyclophosphamide (CYTOXAN) 1,240 mg in sodium chloride 0.9 % 250 mL chemo infusion, 600 mg/m2 = 1,540 mg, Intravenous,  Once, 2 of 6 cycles Dose modification: 600 mg/m2 (original dose 750 mg/m2, Cycle 2, Reason: Provider Judgment) Administration: 1,240 mg (05/18/2019), 1,240 mg (06/08/2019) fosaprepitant (EMEND) 150 mg, dexamethasone (DECADRON) 12 mg in sodium chloride 0.9 % 145 mL IVPB, , Intravenous,  Once, 2 of 6 cycles Administration:  (05/18/2019),  (06/08/2019)  for chemotherapy treatment.    07/19/2019 -  Chemotherapy   The patient had riTUXimab (RITUXAN) 700 mg in sodium chloride 0.9 % 250 mL (2.1875 mg/mL) infusion, 375 mg/m2 = 700 mg, Intravenous,  Once, 11 of 11 cycles Administration: 700 mg (07/19/2019), 700 mg (08/16/2019), 700 mg (09/13/2019), 700 mg (10/10/2019), 700 mg (11/07/2019), 700 mg (12/07/2019), 700 mg (01/04/2020), 700 mg (02/01/2020), 700 mg (03/05/2020), 700 mg (04/02/2020), 700 mg (04/30/2020) riTUXimab-pvvr (RUXIENCE) 700 mg in sodium chloride 0.9 % 250 mL (2.1875 mg/mL) infusion, 375 mg/m2 = 700 mg (original dose ), Intravenous,  Once, 0 of 1 cycle Dose modification: 375 mg/m2 (Cycle 12)  for chemotherapy treatment.     PHYSICAL EXAMINATION:  Vitals:   07/10/20 0800 07/10/20 0835  BP: (!) 137/124 (!) 139/54  Pulse: 78 73  Resp: (!) 30 (!) 22  Temp:  98.1 F (36.7 C)  SpO2: 98% 97%   Filed Weights   06/23/20 1206 07/08/20 1800  Weight: 78.9 kg  76.2 kg    Intake/Output from previous day: 07/21 0701 - 07/22 0700 In: 180 [P.O.:180] Out: 825 [Urine:825]  GENERAL: Alert HEENT: No thrush LYMPH: Soft mobile 1-2 cm left cervical and left axillary nodes LUNGS: Increased respiratory rate, diminished breath sounds on the right HEART: Regular rate and rhythm ABDOMEN:  Nontender, spleen tip palpable in the left subcostal region SKIN: Multiple ecchymoses noted on her bilateral arms, legs, and trunk NEURO: Alert, follows commands, moves all extremities, speaking appropriately Vascular: No leg edema  LABORATORY DATA:  I have reviewed the data as listed CMP Latest Ref Rng & Units 07/10/2020 07/09/2020 07/08/2020  Glucose 70 - 99 mg/dL 103(H) 120(H) 109(H)  BUN 8 - 23 mg/dL '10 10 9  ' Creatinine 0.44 - 1.00 mg/dL 0.51 0.46 0.50  Sodium 135 - 145 mmol/L 143 141 143  Potassium 3.5 - 5.1 mmol/L 3.1(L) 3.9 3.8  Chloride 98 - 111 mmol/L 96(L) 101 106  CO2 22 - 32 mmol/L 36(H) 34(H) 30  Calcium 8.9 - 10.3 mg/dL 7.6(L) 7.7(L) 7.3(L)  Total Protein 6.5 - 8.1 g/dL - - 4.8(L)  Total Bilirubin 0.3 - 1.2 mg/dL - - 0.7  Alkaline Phos 38 - 126 U/L - - 156(H)  AST 15 - 41 U/L - - 37  ALT 0 - 44 U/L - - 26    Lab Results  Component Value Date   WBC 3.9 (L) 07/10/2020   HGB 10.8 (L) 07/10/2020   HCT 33.1 (L) 07/10/2020   MCV 105.8 (H) 07/10/2020   PLT 125 (L) 07/10/2020   NEUTROABS 2.8 06/23/2020    DG Chest 1 View  Result Date: 07/08/2020 CLINICAL DATA:  86 year old female status post right thoracentesis. EXAM: CHEST  1 VIEW COMPARISON:  Chest radiograph dated 07/06/2020. FINDINGS: Right-sided Port-A-Cath in similar position. Bilateral patchy airspace opacities, right greater left with interval progression compared to prior radiograph may represent edema or pneumonia. Clinical correlation is recommended. Small bilateral pleural effusions. No pneumothorax. Stable cardiac silhouette. Atherosclerotic calcification of the aorta. No acute osseous pathology. IMPRESSION: 1. Small bilateral pleural effusions.  No pneumothorax. 2. Bilateral airspace opacities, progressed since the prior radiograph. Electronically Signed   By: Anner Crete M.D.   On: 07/08/2020 18:26   DG Chest 1 View  Result Date: 07/04/2020 CLINICAL DATA:  Status post right thoracentesis. EXAM: CHEST  1  VIEW COMPARISON:  Chest x-ray from yesterday. FINDINGS: Unchanged right chest wall port catheter. The heart size and mediastinal contours are within normal limits. Normal pulmonary vascularity. Trace residual right pleural effusion status post thoracentesis. Improved aeration at the right lung base. No pneumothorax. Unchanged small left pleural effusion and basilar atelectasis. No acute osseous abnormality. IMPRESSION: 1. Trace residual right pleural effusion status post thoracentesis. No pneumothorax. 2. Unchanged small left pleural effusion and basilar atelectasis. Electronically Signed   By: Titus Dubin M.D.   On: 07/04/2020 11:42   DG Chest 2 View  Result Date: 07/09/2020 CLINICAL DATA:  Follow-up pleural effusion. EXAM: CHEST - 2 VIEW COMPARISON:  07/08/20 FINDINGS: Right chest wall port a catheter is noted with tip projecting over the cavoatrial junction. Interval increase in volume of right pleural effusion. Mild diffuse pulmonary edema. Bilateral airspace opacities are noted within the left upper lobe, right midlung and right base concerning for multifocal pneumonia. IMPRESSION: 1. Increase in volume of right pleural effusion with mild interstitial edema 2. Bilateral airspace opacities concerning for multifocal pneumonia. Electronically Signed   By: Kerby Moors M.D.   On: 07/09/2020 11:21  DG Chest 2 View  Result Date: 07/06/2020 CLINICAL DATA:  Worsening shortness of breath. EXAM: CHEST - 2 VIEW COMPARISON:  07/04/2020 FINDINGS: The cardiac silhouette remains grossly normal in size, spur partially obscured by significantly increased patchy opacity in the right lower lung zone. Small amount of posterior pleural fluid. Mild linear density at the left lung base. Right subclavian porta catheter tip in the region of the superior cavoatrial junction. Unremarkable bones. IMPRESSION: 1. Significantly increased right lower lung zone pneumonia. 2. Small amount of posterior pleural fluid. 3. Mild left  basilar atelectasis. Electronically Signed   By: Claudie Revering M.D.   On: 07/06/2020 16:24   DG Chest 2 View  Result Date: 07/03/2020 CLINICAL DATA:  Dyspnea, pleural effusions EXAM: CHEST - 2 VIEW COMPARISON:  06/26/2020 FINDINGS: Frontal and lateral views of the chest demonstrates stable right chest wall port. Cardiac silhouette is unremarkable. There is now a moderate right pleural effusion with right basilar consolidation. Decreased left pleural effusion, with only trace fluid at the left costophrenic angle. Persistent retrocardiac consolidation. No pneumothorax. No acute bony abnormalities. IMPRESSION: 1. Decreased left pleural effusion, with persistent left basilar consolidation. 2. Increasing right pleural effusion, with increasing right basilar consolidation. Electronically Signed   By: Randa Ngo M.D.   On: 07/03/2020 16:11   DG Chest 2 View  Result Date: 06/26/2020 CLINICAL DATA:  Dyspnea. EXAM: CHEST - 2 VIEW COMPARISON:  Chest CT 05/22/2020 FINDINGS: Right chest port remains in place. Hazy opacity at the left lung base consistent with pleural effusion and airspace disease/atelectasis. Right lung is clear. Upper normal heart size. Mild right hilar prominence likely secondary to combination of vascular structures and adenopathy is seen on recent CT. Fullness in the upper mediastinum likely secondary to adenopathy. No pneumothorax. No pulmonary edema. Surgical clips in the left axilla. IMPRESSION: 1. Hazy opacity at the left lung base consistent with pleural effusion and airspace disease/atelectasis. 2. Mild right hilar prominence likely secondary to combination of vascular structures and adenopathy seen on recent CT. Electronically Signed   By: Keith Rake M.D.   On: 06/26/2020 15:54   CT ANGIO CHEST PE W OR WO CONTRAST  Result Date: 07/07/2020 CLINICAL DATA:  Lymphoma, recurrent right pleural effusion bloody tap EXAM: CT ANGIOGRAPHY CHEST WITH CONTRAST TECHNIQUE: Multidetector CT  imaging of the chest was performed using the standard protocol during bolus administration of intravenous contrast. Multiplanar CT image reconstructions and MIPs were obtained to evaluate the vascular anatomy. CONTRAST:  163m OMNIPAQUE IOHEXOL 350 MG/ML SOLN COMPARISON:  None. FINDINGS: Cardiovascular: Aortic atherosclerosis. Normal heart size. Central pulmonary arteries are patent. Mediastinum/Nodes: Adenopathy is identified in the supraclavicular region, bilateral axilla, and mediastinum. Nodes appear and measures slightly smaller in size. Lungs/Pleura: Moderate right and mild to moderate left pleural effusions measuring simple fluid in density. Imaging performed during expiration. Persistent atelectasis at the left lung base. New atelectasis/consolidation at the right lung base. Interlobular septal thickening the non dependent right lung may reflect asymmetric edema. Upper Abdomen: Ascites. Unchanged ill-defined hypoattenuating lesion in the superior spleen. Musculoskeletal: No new abnormality. Review of the MIP images confirms the above findings. IMPRESSION: Moderate right and mild to moderate left pleural effusions. Right basilar atelectasis/consolidation and left basilar atelectasis. Interlobular septal thickening in the right lung may reflect asymmetric edema. Multifocal adenopathy appears stable to slightly improved. Electronically Signed   By: PMacy MisM.D.   On: 07/07/2020 14:33   CT Abdomen Pelvis W Contrast  Result Date: 06/23/2020 CLINICAL DATA:  86year old  female with abdominal pain, nausea and vomiting. History of lymphoma. EXAM: CT ABDOMEN AND PELVIS WITH CONTRAST TECHNIQUE: Multidetector CT imaging of the abdomen and pelvis was performed using the standard protocol following bolus administration of intravenous contrast. CONTRAST:  57m OMNIPAQUE IOHEXOL 300 MG/ML  SOLN COMPARISON:  CT of the chest abdomen pelvis dated 05/22/2020. FINDINGS: Lower chest: Partially visualized moderate left  and trace right pleural effusions, new since the prior CT. There is associated partial compressive atelectasis of the left lower lobe. Pneumonia is not excluded. Clinical correlation is recommended. No intra-abdominal free air. Interval development of a small ascites, new or significantly increased since the prior CT. Hepatobiliary: A 1 cm hypodense focus along the posterior liver capsule is not characterized but may represent a cyst or focal area of scarring. This is similar to prior CT. Subcentimeter hypodense focus in the anterior liver is too small to characterize. There is mild intrahepatic biliary ductal dilatation, likely post cholecystectomy. The common bile duct is dilated measuring 14 mm. No retained calcified stone noted in the central CBD. Pancreas: The pancreas is unremarkable as visualized. Spleen: Mildly enlarged spleen measuring 15 cm in craniocaudal length. Adrenals/Urinary Tract: The adrenal glands are poorly visualized but grossly unremarkable. There is mild fullness of the renal collecting systems bilaterally without frank hydronephrosis. Mild hazy appearance of the urothelium noted. Correlation with urinalysis recommended to exclude UTI. There is symmetric enhancement and excretion of contrast by both kidneys. Subcentimeter left renal hypodense focus is not characterized. The urinary bladder is partially distended. Probable chronic bladder wall thickening and perivesical stranding. Correlation with urinalysis recommended to exclude UTI. Stomach/Bowel: There is sigmoid diverticulosis without definite active inflammatory changes. There is postsurgical changes of bowel with anastomotic suture in the right lower quadrant. No definite evidence of bowel obstruction. Evaluation however is limited in the absence of oral contrast. Vascular/Lymphatic: There is moderate aortoiliac atherosclerotic disease. The IVC is unremarkable as visualized. The SMV, splenic vein, and main portal vein are patent. No  portal venous gas. Bulky retroperitoneal adenopathy encasing the abdominal aorta and IVC as well as enlarged bilateral iliac chain lymph nodes in keeping with history of lymphoma. Overall the degree of adenopathy is relatively similar to prior CT. Reproductive: The uterus is poorly visualized. Other: Diffuse mesenteric edema, new since the prior CT. There is scattered mesenteric adenopathy as seen previously. Musculoskeletal: Degenerative changes of the spine. Minimal compression fracture of the anterior superior and inferior endplates of the L3, new since the prior CT, likely acute or subacute. IMPRESSION: 1. Interval development of partially visualized moderate left and trace right pleural effusions, as well as development of diffuse mesenteric edema and ascites, new since the prior CT. 2. Bulky retroperitoneal and iliac chain adenopathy in keeping with history of lymphoma. Overall the degree of adenopathy is relatively similar to prior CT. 3. Mild splenomegaly. 4. Minimal compression fracture of the superior and inferior endplate of L3, likely acute or subacute. 5. Sigmoid diverticulosis. 6. Aortic Atherosclerosis (ICD10-I70.0). Electronically Signed   By: AAnner CreteM.D.   On: 06/23/2020 16:39   ECHOCARDIOGRAM COMPLETE  Result Date: 07/07/2020    ECHOCARDIOGRAM REPORT   Patient Name:   DCOLIE JOSTENDate of Exam: 07/07/2020 Medical Rec #:  [id number]           Height:       68.0 in Accession #:    [id number]          Weight:       174.0 lb  Date of Birth:  28-Dec-1933           BSA:          1.926 m Patient Age:    39 years             BP:           132/65 mmHg Patient Gender: F                    HR:           855 bpm. Exam Location:  Inpatient Procedure: 2D Echo Indications:    Atrial tachycardia [facility]) [[id number]]  History:        Patient has prior history of Echocardiogram examinations, most                 recent 05/31/2019. Risk Factors:Hypertension. Lymphoma , thyroid                 disease,  thyroid cancer. Past history of afib.  Sonographer:    Darlina Sicilian RDCS Referring Phys: [place]  1. Left ventricular ejection fraction, by estimation, is 60 to 65%. The left ventricle has normal function. The left ventricle has no regional wall motion abnormalities. Left ventricular diastolic parameters were normal.  2. Right ventricular systolic function is normal. The right ventricular size is normal.  3. Moderate pleural effusion in both left and right lateral regions.  4. The mitral valve is normal in structure. Trivial mitral valve regurgitation. No evidence of mitral stenosis.  5. The aortic valve is tricuspid. Aortic valve regurgitation is not visualized. No aortic stenosis is present.  6. The inferior vena cava is normal in size with <50% respiratory variability, suggesting right atrial pressure of 8 mmHg. Conclusion(s)/Recommendation(s): Normal biventricular function without evidence of hemodynamically significant valvular heart disease. FINDINGS  Left Ventricle: Left ventricular ejection fraction, by estimation, is 60 to 65%. The left ventricle has normal function. The left ventricle has no regional wall motion abnormalities. The left ventricular internal cavity size was normal in size. There is  no left ventricular hypertrophy. Left ventricular diastolic parameters were normal. Right Ventricle: The right ventricular size is normal. No increase in right ventricular wall thickness. Right ventricular systolic function is normal. Left Atrium: Left atrial size was normal in size. Right Atrium: Right atrial size was normal in size. Pericardium: Trivial pericardial effusion is present. Mitral Valve: The mitral valve is normal in structure. Trivial mitral valve regurgitation. No evidence of mitral valve stenosis. Tricuspid Valve: The tricuspid valve is normal in structure. Tricuspid valve regurgitation is mild . No evidence of tricuspid stenosis. Aortic Valve: The aortic valve is  tricuspid. Aortic valve regurgitation is not visualized. No aortic stenosis is present. Pulmonic Valve: The pulmonic valve was grossly normal. Pulmonic valve regurgitation is trivial. Aorta: The aortic root, ascending aorta and aortic arch are all structurally normal, with no evidence of dilitation or obstruction. Venous: The inferior vena cava is normal in size with less than 50% respiratory variability, suggesting right atrial pressure of 8 mmHg. IAS/Shunts: No atrial level shunt detected by color flow Doppler. Additional Comments: There is a moderate pleural effusion in both left and right lateral regions.  LEFT VENTRICLE PLAX 2D LVIDd:         5.10 cm  Diastology LVIDs:         3.30 cm  LV e' lateral:   8.27 cm/s LV PW:         0.90 cm  LV E/e' lateral: 9.2 LV IVS:        0.70 cm  LV e' medial:    6.64 cm/s LVOT diam:     1.80 cm  LV E/e' medial:  11.5 LV SV:         30 LV SV Index:   15 LVOT Area:     2.54 cm  RIGHT VENTRICLE RV S prime:     14.80 cm/s TAPSE (M-mode): 2.4 cm LEFT ATRIUM           Index       RIGHT ATRIUM           Index LA diam:      3.00 cm 1.56 cm/m  RA Area:     13.80 cm LA Vol (A2C): 44.7 ml 23.21 ml/m RA Volume:   33.50 ml  17.39 ml/m LA Vol (A4C): 42.6 ml 22.12 ml/m  AORTIC VALVE LVOT Vmax:   77.70 cm/s LVOT Vmean:  49.700 cm/s LVOT VTI:    0.116 m  AORTA Ao Root diam: 3.00 cm MITRAL VALVE MV Area (PHT): 4.96 cm    SHUNTS MV Decel Time: 153 msec    Systemic VTI:  0.12 m MV E velocity: 76.30 cm/s  Systemic Diam: 1.80 cm MV A velocity: 78.80 cm/s MV E/A ratio:  0.97 Buford Dresser MD Electronically signed by Buford Dresser MD Signature Date/Time: 07/07/2020/5:02:22 PM    Final    [place] ASCITES (ABDOMEN LIMITED)  Result Date: 07/01/2020 CLINICAL DATA:  History of lymphoma, now with concern for symptomatic intra-abdominal ascites. Please perform ascites search ultrasound and ultrasound-guided paracentesis as indicated. EXAM: LIMITED ABDOMEN ULTRASOUND FOR ASCITES  TECHNIQUE: Limited ultrasound survey for ascites was performed in all four abdominal quadrants. COMPARISON:  CT abdomen pelvis-06/23/2020 FINDINGS: Sonographic evaluation demonstrates a trace amount of intra-abdominal ascites, too small to allow for safe ultrasound-guided paracentesis. Note is made of small bilateral pleural effusions, the right side of which appears new compared to abdominal CT performed 06/23/2020. IMPRESSION: 1. Trace amount of intra-abdominal ascites, too small to allow for safe ultrasound-guided paracentesis. No paracentesis attempted. 2. Small bilateral pleural effusions - the left-sided pleural effusion appears similar to abdominal CT performed 06/23/2020 however the right-sided pleural effusion appears new of the CT. Electronically Signed   By: Sandi Mariscal M.D.   On: 07/01/2020 15:11   US Abdomen Limited RUQ  Result Date: 07/02/2020 CLINICAL DATA:  Abnormal LFTs EXAM: ULTRASOUND ABDOMEN LIMITED RIGHT UPPER QUADRANT COMPARISON:  CT 06/23/2020.  Ultrasound 07/01/2020 FINDINGS: Gallbladder: Prior cholecystectomy Common bile duct: Diameter: Prominent measuring up to 10 mm, likely related to post cholecystectomy state. Liver: Heterogeneous, slightly increased echotexture. Subtle nodular contours to the liver surface. Findings raise the possibility of cirrhosis. No suspicious focal hepatic abnormality. Portal vein is patent on color Doppler imaging with normal direction of blood flow towards the liver. Other: Ascites and right effusion noted. Prominent porta hepatis lymph nodes. IMPRESSION: Heterogeneous, increased echotexture throughout the liver. Subtle nodular contour seen in some areas of the liver. Findings suggest the possibility of cirrhosis. Perihepatic ascites and right pleural effusion. Mild biliary ductal dilatation, likely related to post cholecystectomy state. Prominent porta hepatis lymph nodes, likely related to liver disease. Electronically Signed   By: Rolm Baptise M.D.   On:  07/02/2020 08:52   US THORACENTESIS ASP PLEURAL SPACE W/IMG GUIDE  Result Date: 07/08/2020 INDICATION: Lymphoma, remote colon and thyroid cancers, dyspnea, right pleural effusion.request for diagnostic and therapeutic thoracentesis. EXAM: ULTRASOUND GUIDED RIGHT THORACENTESIS MEDICATIONS: 1%  lidocaine 10 mL COMPLICATIONS: None immediate. PROCEDURE: An ultrasound guided thoracentesis was thoroughly discussed with the patient and questions answered. The benefits, risks, alternatives and complications were also discussed. The patient understands and wishes to proceed with the procedure. Written consent was obtained. Ultrasound was performed to localize and mark an adequate pocket of fluid in the right chest. The area was then prepped and draped in the normal sterile fashion. 1% Lidocaine was used for local anesthesia. Under ultrasound guidance a 6 Fr Safe-T-Centesis catheter was introduced. Thoracentesis was performed. The catheter was removed and a dressing applied. FINDINGS: A total of approximately 500 mL of red fluid was removed. Samples were sent to the laboratory as requested by the clinical team. IMPRESSION: Successful ultrasound guided right thoracentesis yielding 500 mL of pleural fluid. Read by: Gareth Eagle, PA-C Electronically Signed   By: Jerilynn Mages.  Shick M.D.   On: 07/08/2020 16:31   US THORACENTESIS ASP PLEURAL SPACE W/IMG GUIDE  Result Date: 07/04/2020 INDICATION: Patient with history of lymphoma, remote colon and thyroid cancers, dyspnea, right pleural effusion. Request made for diagnostic and therapeutic right thoracentesis. EXAM: ULTRASOUND GUIDED DIAGNOSTIC AND THERAPEUTIC RIGHT THORACENTESIS MEDICATIONS: 1% lidocaine to skin and subcutaneous tissue COMPLICATIONS: None immediate. PROCEDURE: An ultrasound guided thoracentesis was thoroughly discussed with the patient and questions answered. The benefits, risks, alternatives and complications were also discussed. The patient understands and wishes to  proceed with the procedure. Written consent was obtained. Ultrasound was performed to localize and mark an adequate pocket of fluid in the right chest. The area was then prepped and draped in the normal sterile fashion. 1% Lidocaine was used for local anesthesia. Under ultrasound guidance a 6 Fr Safe-T-Centesis catheter was introduced. Thoracentesis was performed. The catheter was removed and a dressing applied. FINDINGS: A total of approximately 1.6 liters of blood-tinged fluid was removed. Samples were sent to the laboratory as requested by the clinical team. IMPRESSION: Successful ultrasound guided diagnostic and therapeutic right thoracentesis yielding 1.6 liters of pleural fluid. Read by: [place] Robert, PA-C Electronically Signed   By: Lucrezia Europe M.D.   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 Status post weekly Rituxan x4 03/01/2012 through 03/22/2012. She completed 4 "maintenance" doses of Rituxan, last on 12/19/2012. A restaging CT on 02/09/2013 showed no evidence of lymphoma.   Lymph node lateral to the thyroid bed on a neck ultrasound 02/21/2014, status post an FNA biopsy concerning for a lymphoproliferative disorder.  PET scan 09/28/2016 with active lymphoma within the neck, chest, abdomen, pelvis; splenic enlargement and hypermetabolism suspicious for splenic involvement.  Initiation of Rituxan weekly 4 09/29/2016  Initiation of maintenance Rituxan on a 3 month schedule 12/23/2016; final Rituxan 08/31/2018  Thyroid ultrasound 02/07/2019-left cervical lymphadenopathy  PET scan 03/08/2019-extensive recurrent hypermetabolic lymphoma involving the neck, chest, abdomen and pelvis.  03/16/2019 left cervical lymph node biopsy-features consistent with previously diagnosed non-Hodgkin's B-cell lymphoma, phenotypically consistent with marginal  zone lymphoma. Flow cytometry with lambda restricted B-cell population without expression of CD5 or CD10 comprising 87% of all lymphocytes.  Cycle 1 bendamustine/Rituxan 03/22/2019  Excision deep left axillary lymph nodes 05/04/2019-non-Hodgkin's B-cell lymphoma with differential including a marginal zone lymphoma and atypical small lymphocytic lymphoma. Flow cytometry with monoclonal B-cell population without expression of CD5 or CD10, comprises 96% of all lymphocytes.  Cycle 1 [facility]/Rituxan 05/18/2019  Cycle 2 [facility]/Rituxan 06/08/2019  CTs 06/15/2019-partial improvement in diffuse adenopathy, stable  mild splenomegaly  Cycle 1 Revlimid/rituximab 07/19/2019 (Revlimid start 07/20/2019)  Cycle 2 Revlimid/rituximab 08/16/2019 (Revlimid placed on hold 08/30/2019 due to neutropenia)  Cycle 3 of Revlimid/rituximab 09/13/2019 (Revlimid schedule changed to 14 days on/14 days off)  Cycle 4 Revlimid/rituximab 10/10/2019  Cycle 5 Revlimid/rituximab 11/07/2019  Cycle 6 Revlimid/rituximab 12/07/2019  CTs 12/28/2019-diffuse lymphadenopathy-slightly increased compared to 06/15/2019  Cycle 7 Revlimid/rituximab 01/04/2020  Cycle 8 Revlimid/Rituxan 02/01/2020  Cycle 9 Revlimid/Rituxan 03/05/2020  Cycle 10 Revlimid/Rituxan 04/02/2020  Cycle 11 Revlimid/Rituxan 04/30/2020  CT 05/22/2020-mild increase in left supraclavicular adenopathy, mild increase in chest, retroperitoneal, and pelvic adenopathy. Stable mild splenomegaly.  Acalabrutinib started 06/19/2020  CT abdomen/pelvis 06/23/2020-moderate left and trace right pleural effusions, new diffuse mesenteric edema and ascites, stable retroperitoneal and iliac adenopathy, mild splenomegaly 2. Stage IV (T1bN1b M0) papillary thyroid cancer, status post a thyroidectomy with reimplantation of the left superior parathyroid gland on 05/23/2012, status post radioactive iodine therapy, followed by Dr. Buddy Duty.  3. Stage II (T3 N0) colon cancer, status post a right colectomy  10/19/2011, last colonoscopy April 2015-sigmoid adenoma removed.  4. History of a pulmonary embolism December 2012.  5. History of Atrial fibrillation 6. Iron deficiency anemia-new 03/18/2014. Hemoccult positive stool. The anemia corrected with iron. No longer taking iron.  Status post an upper endoscopy and colonoscopy by Dr. Carlean Purl April 2015 with no bleeding source identified, benign adenoma removed from the sigmoid colon. 7. Report of an upper gastric intestinal bleed fall 2016-managed in [place]. [name] 8. Left knee replacement May 2017, repeat left knee surgery May 2018 9. Pruritic rash 07/22/2016 10. Nausea and diarrhea 04/02/2019-stool negative for C. difficile toxin 11. 06/18/2019 [facility] admission for symptomatic anemia. 12.  Admission 06/23/2020 with nausea and diarrhea 13.  Respiratory failure, bilateral pleural effusions  Right thoracentesis 07/04/2020-culture and cytology negative  CT chest 07/07/2020-right and left pleural effusions, bilateral basilar atelectasis, interlobular septal thickening in the right lung-asymmetric edema?  Improved adenopathy  Right thoracentesis 07/08/2020  Tracy Mclaughlin continues to have dyspnea.  Underwent repeat thoracentesis on 7/20 without significant improvement in her symptoms.  Seen by PCCM who has recommended diuresis.  Awaiting flow cytometry from pleural fluid removed on 7/20.  It is possible the pleural effusions are related to lymphoma but other findings this admission suggest improvement in the lymphoma.  The LDH is lower and the palpable lymphadenopathy has improved.  CT chest on 7/19 showed improvement in her chest lymphadenopathy.  Recommendations: 1.  Continue to hold acalabrutinib 2.  Continue diuresis 3.  Await flow cytometry on the pleural fluid 4.  Out of bed as tolerated, assess ability to return home versus nursing facility placement 5.  Follow-up final culture result on 07/08/2020 pleural fluid-gram-positive cocci in clusters reported  today   LOS: 16 days   Mikey Bussing 07/10/20  Tracy Mclaughlin was interviewed and examined.  She continues to have respiratory failure.  She wants to go home.  The etiology of the respiratory findings remains unclear.  I requested flow cytometry on the pleural fluid. Tracy Mclaughlin is not ready for discharge.  I updated her husband by telephone today.  He agrees that he is unable to care for her in her current condition.

## 2020-07-10 NOTE — Progress Notes (Addendum)
Progress Note  Patient Name: Tracy Mclaughlin Date of Encounter: 07/10/2020  Primary Cardiologist: Sinclair Grooms, MD  Subjective   Sleepy this AM, denies CP or SOB.  Inpatient Medications    Scheduled Meds: . Chlorhexidine Gluconate Cloth  6 each Topical Daily  . citalopram  20 mg Oral Daily  . diltiazem  180 mg Oral QHS  . feeding supplement (KATE FARMS STANDARD 1.4)  325 mL Oral BID BM  . feeding supplement (PRO-STAT SUGAR FREE 64)  30 mL Oral Daily  . furosemide  20 mg Intravenous BID  . heparin lock flush  500 Units Intracatheter Q30 days  . levothyroxine  224 mcg Oral Q0600  . melatonin  3 mg Oral QHS  . metoprolol tartrate  12.5 mg Oral BID  . multivitamin with minerals  1 tablet Oral Daily  . pantoprazole  40 mg Oral Daily   Continuous Infusions: . sodium chloride 10 mL/hr at 07/07/20 0945   PRN Meds: sodium chloride, acetaminophen **OR** acetaminophen, albuterol, ALPRAZolam, alum & mag hydroxide-simeth, diphenhydrAMINE, heparin lock flush **AND** heparin lock flush, loperamide, ondansetron **OR** ondansetron (ZOFRAN) IV, sodium chloride flush   Vital Signs    Vitals:   07/09/20 2100 07/10/20 0447 07/10/20 0800 07/10/20 0835  BP: 133/60 (!) 115/52 (!) 137/124 (!) 139/54  Pulse: 77 73 78 73  Resp: 20 20 (!) 30 (!) 22  Temp: 97.7 F (36.5 C) 97.8 F (36.6 C)    TempSrc: Oral Oral    SpO2: 99% 98% 98% 97%  Weight:      Height:        Intake/Output Summary (Last 24 hours) at 07/10/2020 0938 Last data filed at 07/10/2020 0831 Gross per 24 hour  Intake 210 ml  Output 825 ml  Net -615 ml   Last 3 Weights 07/08/2020 06/23/2020 06/13/2020  Weight (lbs) 167 lb 15.9 oz 174 lb 177 lb 14.4 oz  Weight (kg) 76.2 kg 78.926 kg 80.695 kg     Telemetry    NSR, 1 very brief run SVT - Personally Reviewed  Physical Exam   GEN: No acute distress.  HEENT: Normocephalic, atraumatic, sclera non-icteric. Neck: No JVD or bruits. Cardiac: RRR no murmurs, rubs, or  gallops.  Radials/DP/PT 1+ and equal bilaterally.  Respiratory: Coarse BS bilaterally and decreased BS right base, no wheezing or rhonchi. Breathing is unlabored. GI: Soft, nontender, non-distended, BS +x 4. MS: no deformity. Extremities: No clubbing or cyanosis. No edema. Distal pedal pulses are 2+ and equal bilaterally. Neuro:  AAOx3. Follows commands. Psych:  Responds to questions appropriately with a normal affect.  Labs    High Sensitivity Troponin:  No results for input(s): TROPONINIHS in the last 720 hours.    Cardiac EnzymesNo results for input(s): TROPONINI in the last 168 hours. No results for input(s): TROPIPOC in the last 168 hours.   Chemistry Recent Labs  Lab 07/06/20 0510 07/06/20 0510 07/07/20 0544 07/07/20 0544 07/08/20 0340 07/09/20 0537 07/10/20 0322  NA 140   < > 141   < > 143 141 143  K 3.7   < > 3.8   < > 3.8 3.9 3.1*  CL 106   < > 105   < > 106 101 96*  CO2 28   < > 26   < > 30 34* 36*  GLUCOSE 114*   < > 107*   < > 109* 120* 103*  BUN 9   < > 9   < > 9 10 10  CREATININE 0.53   < > 0.50   < > 0.50 0.46 0.51  CALCIUM 7.0*   < > 7.3*   < > 7.3* 7.7* 7.6*  PROT 5.0*  --  5.1*  --  4.8*  --   --   ALBUMIN 2.5*  --  2.6*  --  2.6*  --   --   AST 64*  --  52*  --  37  --   --   ALT 35  --  31  --  26  --   --   ALKPHOS 166*  --  161*  --  156*  --   --   BILITOT 0.9  --  0.8  --  0.7  --   --   GFRNONAA >60   < > >60   < > >60 >60 >60  GFRAA >60   < > >60   < > >60 >60 >60  ANIONGAP 6   < > 10   < > 7 6 11    < > = values in this interval not displayed.     Hematology Recent Labs  Lab 07/08/20 0340 07/09/20 0537 07/10/20 0322  WBC 3.7* 4.5 3.9*  RBC 3.09* 3.22* 3.13*  HGB 10.5* 11.0* 10.8*  HCT 33.4* 33.9* 33.1*  MCV 108.1* 105.3* 105.8*  MCH 34.0 34.2* 34.5*  MCHC 31.4 32.4 32.6  RDW 15.3 14.6 14.4  PLT 117* 130* 125*    BNP Recent Labs  Lab 07/07/20 1640 07/09/20 1559  BNP 400.4* 447.7*     DDimer No results for input(s): DDIMER  in the last 168 hours.   Radiology    DG Chest 1 View  Result Date: 07/08/2020 CLINICAL DATA:  86 year old female status post right thoracentesis. EXAM: CHEST  1 VIEW COMPARISON:  Chest radiograph dated 07/06/2020. FINDINGS: Right-sided Port-A-Cath in similar position. Bilateral patchy airspace opacities, right greater left with interval progression compared to prior radiograph may represent edema or pneumonia. Clinical correlation is recommended. Small bilateral pleural effusions. No pneumothorax. Stable cardiac silhouette. Atherosclerotic calcification of the aorta. No acute osseous pathology. IMPRESSION: 1. Small bilateral pleural effusions.  No pneumothorax. 2. Bilateral airspace opacities, progressed since the prior radiograph. Electronically Signed   By: Anner Crete M.D.   On: 07/08/2020 18:26   DG Chest 2 View  Result Date: 07/09/2020 CLINICAL DATA:  Follow-up pleural effusion. EXAM: CHEST - 2 VIEW COMPARISON:  07/08/20 FINDINGS: Right chest wall port a catheter is noted with tip projecting over the cavoatrial junction. Interval increase in volume of right pleural effusion. Mild diffuse pulmonary edema. Bilateral airspace opacities are noted within the left upper lobe, right midlung and right base concerning for multifocal pneumonia. IMPRESSION: 1. Increase in volume of right pleural effusion with mild interstitial edema 2. Bilateral airspace opacities concerning for multifocal pneumonia. Electronically Signed   By: Kerby Moors M.D.   On: 07/09/2020 11:21   US THORACENTESIS ASP PLEURAL SPACE W/IMG GUIDE  Result Date: 07/08/2020 INDICATION: Lymphoma, remote colon and thyroid cancers, dyspnea, right pleural effusion.request for diagnostic and therapeutic thoracentesis. EXAM: ULTRASOUND GUIDED RIGHT THORACENTESIS MEDICATIONS: 1% lidocaine 10 mL COMPLICATIONS: None immediate. PROCEDURE: An ultrasound guided thoracentesis was thoroughly discussed with the patient and questions answered. The  benefits, risks, alternatives and complications were also discussed. The patient understands and wishes to proceed with the procedure. Written consent was obtained. Ultrasound was performed to localize and mark an adequate pocket of fluid in the right chest. The area was then prepped and  draped in the normal sterile fashion. 1% Lidocaine was used for local anesthesia. Under ultrasound guidance a 6 Fr Safe-T-Centesis catheter was introduced. Thoracentesis was performed. The catheter was removed and a dressing applied. FINDINGS: A total of approximately 500 mL of red fluid was removed. Samples were sent to the laboratory as requested by the clinical team. IMPRESSION: Successful ultrasound guided right thoracentesis yielding 500 mL of pleural fluid. Read by: Gareth Eagle, PA-C Electronically Signed   By: Jerilynn Mages.  Shick M.D.   On: 07/08/2020 16:31    Cardiac Studies   2D Echo 07/07/20  1. Left ventricular ejection fraction, by estimation, is 60 to 65%. The  left ventricle has normal function. The left ventricle has no regional  wall motion abnormalities. Left ventricular diastolic parameters were  normal.  2. Right ventricular systolic function is normal. The right ventricular  size is normal.  3. Moderate pleural effusion in both left and right lateral regions.  4. The mitral valve is normal in structure. Trivial mitral valve  regurgitation. No evidence of mitral stenosis.  5. The aortic valve is tricuspid. Aortic valve regurgitation is not  visualized. No aortic stenosis is present.  6. The inferior vena cava is normal in size with <50% respiratory  variability, suggesting right atrial pressure of 8 mmHg.   Conclusion(s)/Recommendation(s): Normal biventricular function without  evidence of hemodynamically significant valvular heart disease.   Patient Profile     86 y.o. female with history ofparoxysmal atrial fibrillation (prior h/o life threatening bleeding on anticoagulation), stage IV  papillary thyroid carcinoma s/p thyroidectomy, post-surgical hypothyroidism, colon CA s/p resection, non-Hodgkin's lymphoma, pancytopenia, HTN, anemia, arthritis, [place], iron deficiency anemia. She was admitted with n/v, diarrhea, abdominal pain shortly after starting new chemotherapy.She has been found to have new ascites on CT scan (too small to tap), pleural effusion s/p right thoracentesis acute hypoxic respiratory failure, abnormal LFTs, hypokalemia and acute metabolic encephalopathywith waxing/waning confusion.On 7/19 developed transient narrow complex tachycardia, converted to NSR. Also with recurrent pleural effusion this admission requiring repeat thoracentesis 07/08/20.  Assessment & Plan    1. Multiple medical issueswhich include nausea, vomiting, diarrhea following new initiation of chemotherapy with oncologic history as above, with ascites, pleural effusion s/p thoracentesis, acute hypoxic respiratory failure, abnormal LFTs, and acute metabolic encephalopathy - CT angio 07/07/20 showed moderate right and mild-moderate left pleural effusions, right basilar atelectasis/consolidation and left basilar atelectasis with interlobular septal thickening - thoracentesis #1 07/04/20 with reactive mesothelial cells - repeat thoracentesis #2 07/08/20 cytology pending, blood tinged fluid - BNP only 400 range with normal biventricular function and diastolic function so less likely that pleural effusions are primarily heart failure, suspect low albumin also contributing to fluid shifts -  - CXR yesterday with recurrent increase in volume of right pleural effusion as well as findings concerning for multifocal PNA - ? abx ? pleurX - remains on IV Lasix 20mg  BID - further per IM  3. Narrow complex tachycardia transiently on 07/07/20 - appeared more c/w atrial tach vs atrial flutter (less typical for the  - continue diltiazem CD 180mg  nightly and Lopressor 12.5mg  BID - if SVT becomes more frequent, can  titrate either up further - 2D echo reassuring - see below re: abnormal thyroid function which may be contributing  4. History of PAF - not on anticoagulation prior to admission per Dr. Tamala Julian due to h/o life threatening bleeding while on Xarelto - continue monitoring on telemetry  5. Essential HTN - stable  6. Abnormal TSH - suppressed  at 0.330. - recommend further evaluation per medical team, given implications with atrial arrhythmias - will add repeat TSH and fT4 to AM labs  7. Hypokalemia - electrolyte management per primary team, recommend keeping Mg 2.0 or greater and Mg 4.0 or greater  For questions or updates, please contact [place] Please consult [email or web address] for contact info under Cardiology/STEMI.  Signed, Charlie Pitter, PA-C 07/10/2020, 9:38 AM    Patient seen and examined with Melina Copa PA-C.  Agree as above, with the following exceptions and changes as noted below. Patient appears lethargic again today and not as interactive as yesterday. Gen: lethargic, CV: RRR, no murmurs, Lungs: diminished bilaterally, Abd: soft, Extrem: Warm, well perfused, no edema, Neuro/Psych: alert and oriented x 3, normal mood and affect. All available labs, radiology testing, previous records reviewed. Very challenging situation with recurrent pleural effusions and minimal relief of symptoms. At this point, heart failure does not seem to be the driver of volume shifts. Would consider transitioning IV lasix to oral as patient is preparing for hospital discharge.   Cardiology will sign off at this time but do not hesistate to contact us with questions or concerns regarding her care. Please contact us closer to discharge to arrange follow up in cardiology clinic.   Elouise Munroe, MD 07/10/20 4:43 PM

## 2020-07-10 NOTE — Progress Notes (Signed)
PT Cancellation Note  Patient Details Name: Tracy Mclaughlin MRN: [id number] DOB: 1934-02-13   Cancelled Treatment:      Acute hypoxic respiratory failure due to pleural effusion/no PE on the CT. Remains in respiratory distress O2 dependent, hypoxic worsening shortness of breath with minimal exertion.  Pt has been evaluated with rec for SNF.  Will continue to monitor during her Acute Stay.   Rica Koyanagi  PTA Acute  Rehabilitation Services Pager      [phone] Office      [phone]

## 2020-07-10 NOTE — Progress Notes (Signed)
Resumed care for this pt from previous RN at 0300. I agree with previous RN assessment. Pt resting in bed at this time.

## 2020-07-10 NOTE — Plan of Care (Signed)
Awaiting results of Flow Cytometry. Continuing to diuresis. Will Follow Lab Results.

## 2020-07-10 NOTE — Plan of Care (Signed)
  Problem: Pain Managment: Goal: General experience of comfort will improve Outcome: Completed/Met

## 2020-07-11 DIAGNOSIS — J90 Pleural effusion, not elsewhere classified: Secondary | ICD-10-CM

## 2020-07-11 LAB — BASIC METABOLIC PANEL
Anion gap: 12 (ref 5–15)
BUN: 15 mg/dL (ref 8–23)
CO2: 38 mmol/L — ABNORMAL HIGH (ref 22–32)
Calcium: 7.5 mg/dL — ABNORMAL LOW (ref 8.9–10.3)
Chloride: 93 mmol/L — ABNORMAL LOW (ref 98–111)
Creatinine, Ser: 0.67 mg/dL (ref 0.44–1.00)
GFR calc Af Amer: >60 mL/min (ref >60)
GFR calc non Af Amer: >60 mL/min (ref >60)
Glucose, Bld: 117 mg/dL — ABNORMAL HIGH (ref 70–99)
Potassium: 3.2 mmol/L — ABNORMAL LOW (ref 3.5–5.1)
Sodium: 143 mmol/L (ref 135–145)

## 2020-07-11 LAB — CBC
HCT: 32.3 % — ABNORMAL LOW (ref 36.0–46.0)
Hemoglobin: 10.2 g/dL — ABNORMAL LOW (ref 12.0–15.0)
MCH: 33.7 pg (ref 26.0–34.0)
MCHC: 31.6 g/dL (ref 30.0–36.0)
MCV: 106.6 fL — ABNORMAL HIGH (ref 80.0–100.0)
Platelets: 124 10*3/uL — ABNORMAL LOW (ref 150–400)
RBC: 3.03 MIL/uL — ABNORMAL LOW (ref 3.87–5.11)
RDW: 14.6 % (ref 11.5–15.5)
WBC: 4.4 10*3/uL (ref 4.0–10.5)
nRBC: 0 % (ref 0.0–0.2)

## 2020-07-11 LAB — T4, FREE: Free T4: 2.21 ng/dL — ABNORMAL HIGH (ref 0.61–1.12)

## 2020-07-11 LAB — CD19 AND CD20, FLOW CYTOMETRY

## 2020-07-11 LAB — TSH: TSH: 0.466 u[IU]/mL (ref 0.350–4.500)

## 2020-07-11 MED ORDER — POTASSIUM CHLORIDE CRYS ER 20 MEQ PO TBCR
40.0000 meq | EXTENDED_RELEASE_TABLET | Freq: Once | ORAL | Status: AC
  Administered 2020-07-11: 40 meq via ORAL
  Filled 2020-07-11: qty 2

## 2020-07-11 NOTE — Progress Notes (Signed)
PROGRESS NOTE   Tracy Mclaughlin  EVO:[id number] DOB: [birth year] DOA: 06/23/2020 PCP: [name], PA   Chef Complaints:  Nausea, vomiting and poor po intake.  Brief Narrative: 86 y.o.femalewith medical history significant forlow-grade lymphoma without significant response to chemotherapy last year and was recently started onBTK INHIBITORby Dr. [name], hypothyroidism, history of A. fib, pancytopenia presents to the ED with3-4 day history of nausea vomiting and diarrhea and abdominal pain-and right upper quadrant.symptomts started after new chemo on Thursday.Patient denies any fever chest pain.vomitted x3 today and had non bloody diarrhea 4 times today.  ED Course:Hemodynamically stable, afebrile, UA positive for UTI,CT scan abdomen done that showed free fluid with ascites and mesenteric edema, case was discussed Dr. [name] from oncology advised UTI treatment with Zosyn and he will see the patient.  Patient  Was admitted, being managed symptomatically chemo was held diarrhea improved subsequently oncology put the patient back on chemo overall tolerating well. Having nausea anorexia suspecting this is related to her lymphoma. Patient has been started on prednisone to help with appetite and also received trazodone for sleep. Patient remains deconditioned.  She was found to get more sleepy and confused subsequently trazodone and prednisone were discontinued with resolution of her confusion.  Patient was also noticed to have shortness of breath/dyspnea with activity-imaging studies showed pleural effusion underwent right-sided thoracentesis with 1.6 L of blood-tinged fluid removal, cytology reactive medically stable, no growth on culture. PT had suggested skilled nursing facility patient had PICC line SNF as well as home health.  Patient went into rapid tachycardia supraventricular 7/19 a.m. and also with hypoxia-underwent CTA chest no PE but right-sided pleural effusion  atelectasis/consolidation-cardiologist consulted.  Patient converted to sinus rhythm spontaneously. received few days of IV antibiotics , Procalcitonin has been negative/normal- no fever no leukocytosis so stopped  7/20.  ------------------------------------------------------------------------------------------------------------------------------------------   Subjective:  The patient was seen and examined this morning, remained comfortable laying in bed. Much more awake and alert this morning participating in exam.   Noted: Pleural fluid cultures are growing gram-positive cocci  Assessment & Plan:  Acute hypoxic respiratory failure due to pleural effusion/no PE on the CT. -Stable, still requiring 4 L of oxygen, to maintain O2 sat greater than 92% -Currently satting 97% -Pulmonary pleural cultures are growing gram-positive cocci -Initiating broad-spectrum antibiotics of Vanco and Zosyn -we will modify and narrow down antibiotics according to final cultures and sensitivity.   -Remains in respiratory distress O2 dependent, hypoxic worsening shortness of breath with minimal exertion - Recurrent pleural effusion -07/08/2020 US guided right thoracentesis. Yielded 500 mL of red fluid.   CXR: 1. Increase in volume of right pleural effusion with mild interstitial edema 2. Bilateral airspace opacities concerning for multifocal pneumonia.   -Continue gentle diuretics -As needed duo nebs  -We will continue empiric antibiotics -Low suspicion for pneumonia, sepsis   Intractable nausea vomitingdiarrhea andabdominal pain:  -Resolved  -Unclear etiology suspecting due to lymphoma.  - Acalabrutinib was held on admission and resumed 7/8. Diarrhea improved.   Acalabrutinib  was again discontinued as her LFTs were worsening. Has new onset ascites on the CT scan-but too small to tap on the ultrasound . Cont supportive measure.  - Prednisone was added to stimulate appetite but discontinued due  to confusion  Recurrent pleural effusion: -Monitoring -Likely from  Lymphoma Vs infection ? 2/2 Acalabrutinib ( less likely per oncology) .  S/p rt thoracentesis 1.6 L blood-tinged fluid was removed.  Pleural fluid Gram stain no organism, culture no growth so  far, cytology reactive mesothelial cell.   CTA shows moderate right and mild to moderate left pleural effusions-BNP around 400  (Less likely fluid was from a CHF)  -Echo shows preserved EJF 60 to 65%.  no diastolic dysfunction, unclear if related to CHF versus low Albumin.   Cardiology on board will start trial of IV Lasix low-dose, I's and O's: +10.1 L  - order US guided thoracentesis along with labs/cytology,discussed with cardio and  Oncology.   Narrow complex tachycardia 07/07/20 am- pt moved to tele.   Converted to sinus rhythm.  No recurrent episode -remained stable-Cardiology following closely -Continue diltiazem CD 180 nightly, Lopressor 12.5 mg p.o. twice daily, -Following thyroid function -2D echocardiogram reviewed   Ascites: -new onset ascites on the CT scan-too small to tap on paracentesis per IR.     Abnormal LFTS: -Improving  2/2 Acalabrutinib-which is discontinued.  LFTs improving.RUQ [place]- possible cirrhotic changes, hepatitis panel negative.   Acute metabolic encephalopathy from polypharmacy: -Mentation back to baseline  had  confusion/Lethargy: 7/14 a.m likley polypharmacy and pt feels improved after stopping trazodone/prednisone.   -Continue to complain of insomnia, sleeping medication as needed   She is on Xanax low-dose continue for anxiety.  Continue supportive care.    Hypokalemia.:  Monitoring and repeating accordingly today 40 mEq will be given.   Recent Labs  Lab 07/07/20 0544 07/08/20 0340 07/09/20 0537 07/10/20 0322 07/11/20 0321  K 3.8 3.8 3.9 3.1* 3.2*   E coli UTI DDU:[id number] 7 days of antibiotics.   Non Hodgkin's lymphoma -Remained stable, -Oncology following -with progressive  decline, patientwasstartedpast weekonAcalabrutinib she did not have significant response to chemotherapy last year.  Acalabrutinib was held on admission and resumed  But again stopped due to worsening LFTs by oncology.   Failure to thrive/deconditioning: Remains weak and deconditioned.  -PT continues to recommend SNF -She refused SNF.  She is high risk for readmission.  Husband concerned about poor support.  Hypertension: BP is controlled.  Continue Cardizem.    Pancytopenia with anemia/Thrombocytopenia in the setting of lymphoma/chemo: -Monitoring -Hemoglobin and WBC count overall stable.  Monitor.Heparin was discontinued by hematology due to bilateral lower leg bruise and thrombocytopenia.   Recent Labs  Lab 07/09/20 0537 07/10/20 0322 07/11/20 0321  HGB 11.0* 10.8* 10.2*  HCT 33.9* 33.1* 32.3*  WBC 4.5 3.9* 4.4  PLT 130* 125* 124*   History of intermittent atrial fibrillation on Cardizem: - Not on anticoagulation due to history of subarachnoid hemorrhage as per cardiology.  Bruise on the skin heparin discontinued per oncology. improving.     DVT prophylaxis: Place and maintain sequential compression device Start: 06/30/20 0901 Heparin stopped due to  extensive LE bruise 7/12 and low platelet counts by hematology. Code Status: DNR Family Communication: Discussed with patient today.  Yesterday had extensive discussion with the patient's husband.  Status is: Inpatient Remains inpatient appropriate because: Patient remains deconditioned weak, hypoxic.  Dispo: The patient is from: Home               Anticipated d/c is to: SNF as per PT- pt declines SNF and HH.  Continued to engage on placement receomendations.              Anticipated d/c date is: 2 days.  Once shortness of better, and cleared by her oncologist.discussed with her oncology               Patient currently not medically stable.  Nutrition: Diet Order  Diet regular Room service appropriate?  Yes; Fluid consistency: Thin  Diet effective now                Body mass index is 23.73 kg/m.  Consultants:see note  Procedures:see note Microbiology:see note  Medications: Scheduled Meds: . Chlorhexidine Gluconate Cloth  6 each Topical Daily  . citalopram  20 mg Oral Daily  . diltiazem  180 mg Oral QHS  . feeding supplement (KATE FARMS STANDARD 1.4)  325 mL Oral BID BM  . feeding supplement (PRO-STAT SUGAR FREE 64)  30 mL Oral Daily  . furosemide  20 mg Intravenous BID  . heparin lock flush  500 Units Intracatheter Q30 days  . levothyroxine  224 mcg Oral Q0600  . melatonin  3 mg Oral QHS  . metoprolol tartrate  12.5 mg Oral BID  . multivitamin with minerals  1 tablet Oral Daily  . pantoprazole  40 mg Oral Daily   Continuous Infusions: . sodium chloride 10 mL/hr at 07/07/20 0945  . piperacillin-tazobactam (ZOSYN)  IV 3.375 g (07/11/20 1307)  . vancomycin 750 mg (07/11/20 0528)    Antimicrobials: Anti-infectives (From admission, onward)   Start     Dose/Rate Route Frequency Ordered Stop   07/10/20 1600  vancomycin (VANCOREADY) IVPB 750 mg/150 mL     Discontinue     750 mg 150 mL/hr over 60 Minutes Intravenous Every 12 hours 07/10/20 1332     07/10/20 1430  piperacillin-tazobactam (ZOSYN) IVPB 3.375 g     Discontinue     3.375 g 12.5 mL/hr over 240 Minutes Intravenous Every 8 hours 07/10/20 1332     07/06/20 1800  cefTRIAXone (ROCEPHIN) 1 g in sodium chloride 0.9 % 100 mL IVPB  Status:  Discontinued        1 g 200 mL/hr over 30 Minutes Intravenous Every 24 hours 07/06/20 1646 07/08/20 1037   07/06/20 1800  azithromycin (ZITHROMAX) tablet 500 mg  Status:  Discontinued        500 mg Oral Daily 07/06/20 1646 07/08/20 1037   06/27/20 0830  cefTRIAXone (ROCEPHIN) 1 g in sodium chloride 0.9 % 100 mL IVPB  Status:  Discontinued        1 g 200 mL/hr over 30 Minutes Intravenous Every 24 hours 06/27/20 0733 06/30/20 1340   06/24/20 0200  piperacillin-tazobactam (ZOSYN) IVPB  3.375 g  Status:  Discontinued        3.375 g 12.5 mL/hr over 240 Minutes Intravenous Every 8 hours 06/23/20 1703 06/27/20 0733   06/23/20 1645  piperacillin-tazobactam (ZOSYN) IVPB 3.375 g        3.375 g 100 mL/hr over 30 Minutes Intravenous  Once 06/23/20 1638 06/23/20 1834   06/23/20 1615  cefTRIAXone (ROCEPHIN) 1 g in sodium chloride 0.9 % 100 mL IVPB  Status:  Discontinued        1 g 200 mL/hr over 30 Minutes Intravenous  Once 06/23/20 1607 06/23/20 1608       Objective: Vitals: Today's Vitals   07/11/20 0449 07/11/20 0450 07/11/20 1106 07/11/20 1107  BP:  (!) 115/48 (!) 121/47   Pulse:  60 66   Resp:  18    Temp:  97.7 F (36.5 C)    TempSrc:  Axillary    SpO2:  97%    Weight: 70.8 kg     Height:      PainSc:    Asleep    Intake/Output Summary (Last 24 hours) at 07/11/2020 1324 Last data filed at 07/11/2020  0449 Gross per 24 hour  Intake 320 ml  Output 700 ml  Net -380 ml   Filed Weights   07/08/20 1800 07/10/20 1400 07/11/20 0449  Weight: 76.2 kg 76.1 kg 70.8 kg   Weight change:    Intake/Output from previous day: 07/22 0701 - 07/23 0700 In: 350 [P.O.:130; I.V.:20; IV Piggyback:200] Out: 1300 [Urine:1300] Intake/Output this shift: No intake/output data recorded.      Physical Exam:   General:  Alert, oriented, cooperative, no distress; wife at bedside  HEENT:  Normocephalic, PERRL, otherwise with in Normal limits   Neuro:  CNII-XII intact. , normal motor and sensation, reflexes intact   Lungs:   Clear to auscultation, diminished breath sounds on the right greater than left lower lobes, respirations unlabored, no wheezes / crackles  Cardio:    S1/S2, RRR, No murmure, No Rubs or Gallops   Abdomen:   Soft, non-tender, bowel sounds active all four quadrants,  no guarding or peritoneal signs.  Muscular skeletal:   Symmetrical generalized weaknesses, Limited exam - in bed, able to move all 4 extremities  2+ pulses,  symmetric, No pitting edema  Skin:  Dry,  warm to touch, negative for any Rashes, No open wounds Port a catheter on the chest   Wounds: None visible per nursing documentation              Data Reviewed: I have personally reviewed following labs and imaging studies CBC: Recent Labs  Lab 07/07/20 0544 07/08/20 0340 07/09/20 0537 07/10/20 0322 07/11/20 0321  WBC 4.2 3.7* 4.5 3.9* 4.4  HGB 11.5* 10.5* 11.0* 10.8* 10.2*  HCT 36.0 33.4* 33.9* 33.1* 32.3*  MCV 107.1* 108.1* 105.3* 105.8* 106.6*  PLT 111* 117* 130* 125* 970*   Basic Metabolic Panel: Recent Labs  Lab 07/07/20 0544 07/08/20 0340 07/09/20 0537 07/10/20 0322 07/11/20 0321  NA 141 143 141 143 143  K 3.8 3.8 3.9 3.1* 3.2*  CL 105 106 101 96* 93*  CO2 26 30 34* 36* 38*  GLUCOSE 107* 109* 120* 103* 117*  BUN 9 9 10 10 15   CREATININE 0.50 0.50 0.46 0.51 0.67  CALCIUM 7.3* 7.3* 7.7* 7.6* 7.5*  MG  --  1.7 1.7  --   --    GFR: Estimated Creatinine Clearance: 53.7 mL/min (by C-G formula based on SCr of 0.67 mg/dL). Liver Function Tests: Recent Labs  Lab 07/05/20 0437 07/06/20 0510 07/07/20 0544 07/08/20 0340  AST 81* 64* 52* 37  ALT 41 35 31 26  ALKPHOS 164* 166* 161* 156*  BILITOT 0.9 0.9 0.8 0.7  PROT 4.7* 5.0* 5.1* 4.8*  ALBUMIN 2.4* 2.5* 2.6* 2.6*   No results for input(s): LIPASE, AMYLASE in the last 168 hours. No results for input(s): AMMONIA in the last 168 hours.CBG: Recent Labs  Lab 07/08/20 0754 07/08/20 0824 07/08/20 1205 07/09/20 0753 07/09/20 1632  GLUCAP 108* 106* 115* 103* 100*   Lipid Profile: No results for input(s): CHOL, HDL, LDLCALC, TRIG, CHOLHDL, LDLDIRECT in the last 72 hours. Thyroid Function Tests: Recent Labs    07/11/20 0321  TSH 0.466  FREET4 2.21*   Anemia Panel: No results for input(s): VITAMINB12, FOLATE, FERRITIN, TIBC, IRON, RETICCTPCT in the last 72 hours. Sepsis Labs: Recent Labs  Lab 07/06/20 1703 07/07/20 0544 07/08/20 0340 07/09/20 1559  PROCALCITON <0.10 <0.10 <0.10 <0.10     Recent Results (from the past 240 hour(s))  Body fluid culture     Status: None   Collection Time: 07/04/20 11:12 AM  Specimen: Lung, Right; Pleural Fluid  Result Value Ref Range Status   Specimen Description   Final    PLEURAL Performed at [facility] [address], [place], [place] [address]    Special Requests   Final    NONE Performed at [facility], [place] [address], [place], [place] [address]    Gram Stain   Final    FEW WBC PRESENT, PREDOMINANTLY MONONUCLEAR NO ORGANISMS SEEN    Culture   Final    NO GROWTH 3 DAYS Performed at [facility] [address], [place], [place] [address]    Report Status 07/07/2020 FINAL  Final  Gram stain     Status: None   Collection Time: 07/08/20  5:03 PM   Specimen: Fluid  Result Value Ref Range Status   Specimen Description FLUID PLEURAL  Final   Special Requests   Final    BOTTLES DRAWN AEROBIC AND ANAEROBIC Blood Culture adequate volume   Gram Stain   Final    RARE WBC PRESENT, PREDOMINANTLY MONONUCLEAR NO ORGANISMS SEEN Performed at [facility], [place] [address] [place], [place] [address]    Report Status 07/08/2020 FINAL  Final  Culture, body fluid-bottle     Status: Abnormal (Preliminary result)   Collection Time: 07/08/20  5:04 PM   Specimen: Fluid  Result Value Ref Range Status   Specimen Description FLUID PLEURAL  Final   Special Requests   Final    BOTTLES DRAWN AEROBIC AND ANAEROBIC Blood Culture adequate volume   Gram Stain   Final    GRAM POSITIVE COCCI IN CLUSTERS AEROBIC BOTTLE ONLY CRITICAL RESULT CALLED TO, READ BACK BY AND VERIFIED WITH: RN RUTH M. [id number] FCP    Culture (A)  Final    STAPHYLOCOCCUS EPIDERMIDIS SUSCEPTIBILITIES TO FOLLOW Performed at [facility], [place] [address], [place], [place] [address]    Report Status PENDING  Incomplete      Radiology Studies: No results found.   LOS: 3 days   Deatra James, MD [facility]  07/11/2020, 1:24 PM

## 2020-07-11 NOTE — Progress Notes (Addendum)
HEMATOLOGY-ONCOLOGY PROGRESS NOTE  SUBJECTIVE: More sedated this morning.  Received both Xanax and Benadryl around bedtime last evening.  I was able to awaken her but she was unable to clearly tell me if she was having ongoing shortness of breath.  She offers no complaints this morning.  Oncology History  Non Hodgkin's lymphoma ([place])  09/29/2016 Initial Diagnosis   Non Hodgkin's lymphoma ([place])   03/22/2019 - 04/23/2019 Chemotherapy   The patient had palonosetron (ALOXI) injection 0.25 mg, 0.25 mg, Intravenous,  Once, 1 of 4 cycles Administration: 0.25 mg (03/22/2019) riTUXimab (RITUXAN) 800 mg in sodium chloride 0.9 % 250 mL (2.4242 mg/mL) infusion, 375 mg/m2 = 800 mg, Intravenous,  Once, 1 of 4 cycles Administration: 800 mg (03/22/2019) bendamustine (BENDEKA) 150 mg in sodium chloride 0.9 % 50 mL (2.6786 mg/mL) chemo infusion, 70 mg/m2 = 150 mg (100 % of original dose 70 mg/m2), Intravenous,  Once, 1 of 4 cycles Dose modification: 70 mg/m2 (original dose 70 mg/m2, Cycle 1, Reason: Provider Judgment) Administration: 150 mg (03/22/2019), 150 mg (03/23/2019)  for chemotherapy treatment.    05/18/2019 - 06/28/2019 Chemotherapy   The patient had DOXOrubicin (ADRIAMYCIN) chemo injection 82 mg, 40 mg/m2 = 82 mg (100 % of original dose 40 mg/m2), Intravenous,  Once, 2 of 6 cycles Dose modification: 40 mg/m2 (original dose 40 mg/m2, Cycle 1, Reason: Provider Judgment) Administration: 82 mg (05/18/2019), 82 mg (06/08/2019) palonosetron (ALOXI) injection 0.25 mg, 0.25 mg, Intravenous,  Once, 2 of 6 cycles Administration: 0.25 mg (05/18/2019), 0.25 mg (06/08/2019) pegfilgrastim (NEULASTA ONPRO KIT) injection 6 mg, 6 mg, Subcutaneous, Once, 2 of 6 cycles Administration: 6 mg (05/18/2019), 6 mg (06/08/2019) vinCRIStine (ONCOVIN) 1 mg in sodium chloride 0.9 % 50 mL chemo infusion, 1 mg (100 % of original dose 1 mg), Intravenous,  Once, 2 of 6 cycles Dose modification: 1 mg (original dose 1 mg, Cycle 1, Reason: Provider  Judgment) Administration: 1 mg (05/18/2019), 1 mg (06/08/2019) riTUXimab (RITUXAN) 800 mg in sodium chloride 0.9 % 250 mL (2.4242 mg/mL) infusion, 375 mg/m2 = 800 mg, Intravenous,  Once, 2 of 6 cycles Administration: 800 mg (05/18/2019), 800 mg (06/08/2019) cyclophosphamide (CYTOXAN) 1,240 mg in sodium chloride 0.9 % 250 mL chemo infusion, 600 mg/m2 = 1,540 mg, Intravenous,  Once, 2 of 6 cycles Dose modification: 600 mg/m2 (original dose 750 mg/m2, Cycle 2, Reason: Provider Judgment) Administration: 1,240 mg (05/18/2019), 1,240 mg (06/08/2019) fosaprepitant (EMEND) 150 mg, dexamethasone (DECADRON) 12 mg in sodium chloride 0.9 % 145 mL IVPB, , Intravenous,  Once, 2 of 6 cycles Administration:  (05/18/2019),  (06/08/2019)  for chemotherapy treatment.    07/19/2019 -  Chemotherapy   The patient had riTUXimab (RITUXAN) 700 mg in sodium chloride 0.9 % 250 mL (2.1875 mg/mL) infusion, 375 mg/m2 = 700 mg, Intravenous,  Once, 11 of 11 cycles Administration: 700 mg (07/19/2019), 700 mg (08/16/2019), 700 mg (09/13/2019), 700 mg (10/10/2019), 700 mg (11/07/2019), 700 mg (12/07/2019), 700 mg (01/04/2020), 700 mg (02/01/2020), 700 mg (03/05/2020), 700 mg (04/02/2020), 700 mg (04/30/2020) riTUXimab-pvvr (RUXIENCE) 700 mg in sodium chloride 0.9 % 250 mL (2.1875 mg/mL) infusion, 375 mg/m2 = 700 mg (original dose ), Intravenous,  Once, 0 of 1 cycle Dose modification: 375 mg/m2 (Cycle 12)  for chemotherapy treatment.     PHYSICAL EXAMINATION:  Vitals:   07/10/20 2044 07/11/20 0450  BP: (!) 122/47 (!) 115/48  Pulse: 73 60  Resp: 16 18  Temp: 97.8 F (36.6 C) 97.7 F (36.5 C)  SpO2: 98% 97%  Filed Weights   07/08/20 1800 07/10/20 1400 07/11/20 0449  Weight: 76.2 kg 76.1 kg 70.8 kg    Intake/Output from previous day: 07/22 0701 - 07/23 0700 In: 350 [P.O.:130; I.V.:20; IV Piggyback:200] Out: 1300 [Urine:1300]  GENERAL: Able to awaken, but more sedated this morning HEENT: No thrush LYMPH: Soft mobile 1-2 cm left  cervical and left axillary nodes LUNGS: Increased respiratory rate, diminished breath sounds on the right HEART: Regular rate and rhythm ABDOMEN: Nontender, spleen tip palpable in the left subcostal region SKIN: Multiple ecchymoses noted on her bilateral arms, legs, and trunk NEURO: Very sleepy but able to awaken Vascular: No leg edema  LABORATORY DATA:  I have reviewed the data as listed CMP Latest Ref Rng & Units 07/11/2020 07/10/2020 07/09/2020  Glucose 70 - 99 mg/dL 117(H) 103(H) 120(H)  BUN 8 - 23 mg/dL _0 Creatinine 0.44 - 1.00 mg/dL 0.67 0.51 0.46  Sodium 135 - 145 mmol/L 143 143 141  Potassium 3.5 - 5.1 mmol/L 3.2(L) 3.1(L) 3.9  Chloride 98 - 111 mmol/L 93(L) 96(L) 101  CO2 22 - 32 mmol/L 38(H) 36(H) 34(H)  Calcium 8.9 - 10.3 mg/dL 7.5(L) 7.6(L) 7.7(L)  Total Protein 6.5 - 8.1 g/dL - - -  Total Bilirubin 0.3 - 1.2 mg/dL - - -  Alkaline Phos 38 - 126 U/L - - -  AST 15 - 41 U/L - - -  ALT 0 - 44 U/L - - -    Lab Results  Component Value Date   WBC 4.4 07/11/2020   HGB 10.2 (L) 07/11/2020   HCT 32.3 (L) 07/11/2020   MCV 106.6 (H) 07/11/2020   PLT 124 (L) 07/11/2020   NEUTROABS 2.8 06/23/2020    DG Chest 1 View  Result Date: 07/08/2020 CLINICAL DATA:  86 year old female status post right thoracentesis. EXAM: CHEST  1 VIEW COMPARISON:  Chest radiograph dated 07/06/2020. FINDINGS: Right-sided Port-A-Cath in similar position. Bilateral patchy airspace opacities, right greater left with interval progression compared to prior radiograph may represent edema or pneumonia. Clinical correlation is recommended. Small bilateral pleural effusions. No pneumothorax. Stable cardiac silhouette. Atherosclerotic calcification of the aorta. No acute osseous pathology. IMPRESSION: 1. Small bilateral pleural effusions.  No pneumothorax. 2. Bilateral airspace opacities, progressed since the prior radiograph. Electronically Signed   By: Anner Crete M.D.   On: 07/08/2020 18:26   DG Chest  1 View  Result Date: 07/04/2020 CLINICAL DATA:  Status post right thoracentesis. EXAM: CHEST  1 VIEW COMPARISON:  Chest x-ray from yesterday. FINDINGS: Unchanged right chest wall port catheter. The heart size and mediastinal contours are within normal limits. Normal pulmonary vascularity. Trace residual right pleural effusion status post thoracentesis. Improved aeration at the right lung base. No pneumothorax. Unchanged small left pleural effusion and basilar atelectasis. No acute osseous abnormality. IMPRESSION: 1. Trace residual right pleural effusion status post thoracentesis. No pneumothorax. 2. Unchanged small left pleural effusion and basilar atelectasis. Electronically Signed   By: Titus Dubin M.D.   On: 07/04/2020 11:42   DG Chest 2 View  Result Date: 07/09/2020 CLINICAL DATA:  Follow-up pleural effusion. EXAM: CHEST - 2 VIEW COMPARISON:  07/08/20 FINDINGS: Right chest wall port a catheter is noted with tip projecting over the cavoatrial junction. Interval increase in volume of right pleural effusion. Mild diffuse pulmonary edema. Bilateral airspace opacities are noted within the left upper lobe, right midlung and right base concerning for multifocal pneumonia. IMPRESSION: 1. Increase in volume of right pleural effusion with mild interstitial  edema 2. Bilateral airspace opacities concerning for multifocal pneumonia. Electronically Signed   By: Kerby Moors M.D.   On: 07/09/2020 11:21   DG Chest 2 View  Result Date: 07/06/2020 CLINICAL DATA:  Worsening shortness of breath. EXAM: CHEST - 2 VIEW COMPARISON:  07/04/2020 FINDINGS: The cardiac silhouette remains grossly normal in size, spur partially obscured by significantly increased patchy opacity in the right lower lung zone. Small amount of posterior pleural fluid. Mild linear density at the left lung base. Right subclavian porta catheter tip in the region of the superior cavoatrial junction. Unremarkable bones. IMPRESSION: 1. Significantly  increased right lower lung zone pneumonia. 2. Small amount of posterior pleural fluid. 3. Mild left basilar atelectasis. Electronically Signed   By: Claudie Revering M.D.   On: 07/06/2020 16:24   DG Chest 2 View  Result Date: 07/03/2020 CLINICAL DATA:  Dyspnea, pleural effusions EXAM: CHEST - 2 VIEW COMPARISON:  06/26/2020 FINDINGS: Frontal and lateral views of the chest demonstrates stable right chest wall port. Cardiac silhouette is unremarkable. There is now a moderate right pleural effusion with right basilar consolidation. Decreased left pleural effusion, with only trace fluid at the left costophrenic angle. Persistent retrocardiac consolidation. No pneumothorax. No acute bony abnormalities. IMPRESSION: 1. Decreased left pleural effusion, with persistent left basilar consolidation. 2. Increasing right pleural effusion, with increasing right basilar consolidation. Electronically Signed   By: Randa Ngo M.D.   On: 07/03/2020 16:11   DG Chest 2 View  Result Date: 06/26/2020 CLINICAL DATA:  Dyspnea. EXAM: CHEST - 2 VIEW COMPARISON:  Chest CT 05/22/2020 FINDINGS: Right chest port remains in place. Hazy opacity at the left lung base consistent with pleural effusion and airspace disease/atelectasis. Right lung is clear. Upper normal heart size. Mild right hilar prominence likely secondary to combination of vascular structures and adenopathy is seen on recent CT. Fullness in the upper mediastinum likely secondary to adenopathy. No pneumothorax. No pulmonary edema. Surgical clips in the left axilla. IMPRESSION: 1. Hazy opacity at the left lung base consistent with pleural effusion and airspace disease/atelectasis. 2. Mild right hilar prominence likely secondary to combination of vascular structures and adenopathy seen on recent CT. Electronically Signed   By: Keith Rake M.D.   On: 06/26/2020 15:54   CT ANGIO CHEST PE W OR WO CONTRAST  Result Date: 07/07/2020 CLINICAL DATA:  Lymphoma, recurrent right  pleural effusion bloody tap EXAM: CT ANGIOGRAPHY CHEST WITH CONTRAST TECHNIQUE: Multidetector CT imaging of the chest was performed using the standard protocol during bolus administration of intravenous contrast. Multiplanar CT image reconstructions and MIPs were obtained to evaluate the vascular anatomy. CONTRAST:  190m OMNIPAQUE IOHEXOL 350 MG/ML SOLN COMPARISON:  None. FINDINGS: Cardiovascular: Aortic atherosclerosis. Normal heart size. Central pulmonary arteries are patent. Mediastinum/Nodes: Adenopathy is identified in the supraclavicular region, bilateral axilla, and mediastinum. Nodes appear and measures slightly smaller in size. Lungs/Pleura: Moderate right and mild to moderate left pleural effusions measuring simple fluid in density. Imaging performed during expiration. Persistent atelectasis at the left lung base. New atelectasis/consolidation at the right lung base. Interlobular septal thickening the non dependent right lung may reflect asymmetric edema. Upper Abdomen: Ascites. Unchanged ill-defined hypoattenuating lesion in the superior spleen. Musculoskeletal: No new abnormality. Review of the MIP images confirms the above findings. IMPRESSION: Moderate right and mild to moderate left pleural effusions. Right basilar atelectasis/consolidation and left basilar atelectasis. Interlobular septal thickening in the right lung may reflect asymmetric edema. Multifocal adenopathy appears stable to slightly improved. Electronically Signed  By: Macy Mis M.D.   On: 07/07/2020 14:33   CT Abdomen Pelvis W Contrast  Result Date: 06/23/2020 CLINICAL DATA:  86 year old female with abdominal pain, nausea and vomiting. History of lymphoma. EXAM: CT ABDOMEN AND PELVIS WITH CONTRAST TECHNIQUE: Multidetector CT imaging of the abdomen and pelvis was performed using the standard protocol following bolus administration of intravenous contrast. CONTRAST:  18m OMNIPAQUE IOHEXOL 300 MG/ML  SOLN COMPARISON:  CT of the  chest abdomen pelvis dated 05/22/2020. FINDINGS: Lower chest: Partially visualized moderate left and trace right pleural effusions, new since the prior CT. There is associated partial compressive atelectasis of the left lower lobe. Pneumonia is not excluded. Clinical correlation is recommended. No intra-abdominal free air. Interval development of a small ascites, new or significantly increased since the prior CT. Hepatobiliary: A 1 cm hypodense focus along the posterior liver capsule is not characterized but may represent a cyst or focal area of scarring. This is similar to prior CT. Subcentimeter hypodense focus in the anterior liver is too small to characterize. There is mild intrahepatic biliary ductal dilatation, likely post cholecystectomy. The common bile duct is dilated measuring 14 mm. No retained calcified stone noted in the central CBD. Pancreas: The pancreas is unremarkable as visualized. Spleen: Mildly enlarged spleen measuring 15 cm in craniocaudal length. Adrenals/Urinary Tract: The adrenal glands are poorly visualized but grossly unremarkable. There is mild fullness of the renal collecting systems bilaterally without frank hydronephrosis. Mild hazy appearance of the urothelium noted. Correlation with urinalysis recommended to exclude UTI. There is symmetric enhancement and excretion of contrast by both kidneys. Subcentimeter left renal hypodense focus is not characterized. The urinary bladder is partially distended. Probable chronic bladder wall thickening and perivesical stranding. Correlation with urinalysis recommended to exclude UTI. Stomach/Bowel: There is sigmoid diverticulosis without definite active inflammatory changes. There is postsurgical changes of bowel with anastomotic suture in the right lower quadrant. No definite evidence of bowel obstruction. Evaluation however is limited in the absence of oral contrast. Vascular/Lymphatic: There is moderate aortoiliac atherosclerotic disease. The  IVC is unremarkable as visualized. The SMV, splenic vein, and main portal vein are patent. No portal venous gas. Bulky retroperitoneal adenopathy encasing the abdominal aorta and IVC as well as enlarged bilateral iliac chain lymph nodes in keeping with history of lymphoma. Overall the degree of adenopathy is relatively similar to prior CT. Reproductive: The uterus is poorly visualized. Other: Diffuse mesenteric edema, new since the prior CT. There is scattered mesenteric adenopathy as seen previously. Musculoskeletal: Degenerative changes of the spine. Minimal compression fracture of the anterior superior and inferior endplates of the L3, new since the prior CT, likely acute or subacute. IMPRESSION: 1. Interval development of partially visualized moderate left and trace right pleural effusions, as well as development of diffuse mesenteric edema and ascites, new since the prior CT. 2. Bulky retroperitoneal and iliac chain adenopathy in keeping with history of lymphoma. Overall the degree of adenopathy is relatively similar to prior CT. 3. Mild splenomegaly. 4. Minimal compression fracture of the superior and inferior endplate of L3, likely acute or subacute. 5. Sigmoid diverticulosis. 6. Aortic Atherosclerosis (ICD10-I70.0). Electronically Signed   By: AAnner CreteM.D.   On: 06/23/2020 16:39   ECHOCARDIOGRAM COMPLETE  Result Date: 07/07/2020    ECHOCARDIOGRAM REPORT   Patient Name:   DNJERI VICENTEDate of Exam: 07/07/2020 Medical Rec #:  [id number]           Height:       68.0 in  Accession #:    [id number]           Weight:       174.0 lb Date of Birth:  Jan 22, 1934           BSA:          1.926 m Patient Age:    18 years             BP:           132/65 mmHg Patient Gender: F                    HR:           855 bpm. Exam Location:  Inpatient Procedure: 2D Echo Indications:    Atrial tachycardia [facility]) [[id number]]  History:        Patient has prior history of Echocardiogram examinations, most                  recent 05/31/2019. Risk Factors:Hypertension. Lymphoma , thyroid                 disease, thyroid cancer. Past history of afib.  Sonographer:    Darlina Sicilian RDCS Referring Phys: [place]  1. Left ventricular ejection fraction, by estimation, is 60 to 65%. The left ventricle has normal function. The left ventricle has no regional wall motion abnormalities. Left ventricular diastolic parameters were normal.  2. Right ventricular systolic function is normal. The right ventricular size is normal.  3. Moderate pleural effusion in both left and right lateral regions.  4. The mitral valve is normal in structure. Trivial mitral valve regurgitation. No evidence of mitral stenosis.  5. The aortic valve is tricuspid. Aortic valve regurgitation is not visualized. No aortic stenosis is present.  6. The inferior vena cava is normal in size with <50% respiratory variability, suggesting right atrial pressure of 8 mmHg. Conclusion(s)/Recommendation(s): Normal biventricular function without evidence of hemodynamically significant valvular heart disease. FINDINGS  Left Ventricle: Left ventricular ejection fraction, by estimation, is 60 to 65%. The left ventricle has normal function. The left ventricle has no regional wall motion abnormalities. The left ventricular internal cavity size was normal in size. There is  no left ventricular hypertrophy. Left ventricular diastolic parameters were normal. Right Ventricle: The right ventricular size is normal. No increase in right ventricular wall thickness. Right ventricular systolic function is normal. Left Atrium: Left atrial size was normal in size. Right Atrium: Right atrial size was normal in size. Pericardium: Trivial pericardial effusion is present. Mitral Valve: The mitral valve is normal in structure. Trivial mitral valve regurgitation. No evidence of mitral valve stenosis. Tricuspid Valve: The tricuspid valve is normal in structure. Tricuspid valve  regurgitation is mild . No evidence of tricuspid stenosis. Aortic Valve: The aortic valve is tricuspid. Aortic valve regurgitation is not visualized. No aortic stenosis is present. Pulmonic Valve: The pulmonic valve was grossly normal. Pulmonic valve regurgitation is trivial. Aorta: The aortic root, ascending aorta and aortic arch are all structurally normal, with no evidence of dilitation or obstruction. Venous: The inferior vena cava is normal in size with less than 50% respiratory variability, suggesting right atrial pressure of 8 mmHg. IAS/Shunts: No atrial level shunt detected by color flow Doppler. Additional Comments: There is a moderate pleural effusion in both left and right lateral regions.  LEFT VENTRICLE PLAX 2D LVIDd:         5.10 cm  Diastology LVIDs:  3.30 cm  LV e' lateral:   8.27 cm/s LV PW:         0.90 cm  LV E/e' lateral: 9.2 LV IVS:        0.70 cm  LV e' medial:    6.64 cm/s LVOT diam:     1.80 cm  LV E/e' medial:  11.5 LV SV:         30 LV SV Index:   15 LVOT Area:     2.54 cm  RIGHT VENTRICLE RV S prime:     14.80 cm/s TAPSE (M-mode): 2.4 cm LEFT ATRIUM           Index       RIGHT ATRIUM           Index LA diam:      3.00 cm 1.56 cm/m  RA Area:     13.80 cm LA Vol (A2C): 44.7 ml 23.21 ml/m RA Volume:   33.50 ml  17.39 ml/m LA Vol (A4C): 42.6 ml 22.12 ml/m  AORTIC VALVE LVOT Vmax:   77.70 cm/s LVOT Vmean:  49.700 cm/s LVOT VTI:    0.116 m  AORTA Ao Root diam: 3.00 cm MITRAL VALVE MV Area (PHT): 4.96 cm    SHUNTS MV Decel Time: 153 msec    Systemic VTI:  0.12 m MV E velocity: 76.30 cm/s  Systemic Diam: 1.80 cm MV A velocity: 78.80 cm/s MV E/A ratio:  0.97 Buford Dresser MD Electronically signed by Buford Dresser MD Signature Date/Time: 07/07/2020/5:02:22 PM    Final    [place] ASCITES (ABDOMEN LIMITED)  Result Date: 07/01/2020 CLINICAL DATA:  History of lymphoma, now with concern for symptomatic intra-abdominal ascites. Please perform ascites search ultrasound and  ultrasound-guided paracentesis as indicated. EXAM: LIMITED ABDOMEN ULTRASOUND FOR ASCITES TECHNIQUE: Limited ultrasound survey for ascites was performed in all four abdominal quadrants. COMPARISON:  CT abdomen pelvis-06/23/2020 FINDINGS: Sonographic evaluation demonstrates a trace amount of intra-abdominal ascites, too small to allow for safe ultrasound-guided paracentesis. Note is made of small bilateral pleural effusions, the right side of which appears new compared to abdominal CT performed 06/23/2020. IMPRESSION: 1. Trace amount of intra-abdominal ascites, too small to allow for safe ultrasound-guided paracentesis. No paracentesis attempted. 2. Small bilateral pleural effusions - the left-sided pleural effusion appears similar to abdominal CT performed 06/23/2020 however the right-sided pleural effusion appears new of the CT. Electronically Signed   By: Sandi Mariscal M.D.   On: 07/01/2020 15:11   US Abdomen Limited RUQ  Result Date: 07/02/2020 CLINICAL DATA:  Abnormal LFTs EXAM: ULTRASOUND ABDOMEN LIMITED RIGHT UPPER QUADRANT COMPARISON:  CT 06/23/2020.  Ultrasound 07/01/2020 FINDINGS: Gallbladder: Prior cholecystectomy Common bile duct: Diameter: Prominent measuring up to 10 mm, likely related to post cholecystectomy state. Liver: Heterogeneous, slightly increased echotexture. Subtle nodular contours to the liver surface. Findings raise the possibility of cirrhosis. No suspicious focal hepatic abnormality. Portal vein is patent on color Doppler imaging with normal direction of blood flow towards the liver. Other: Ascites and right effusion noted. Prominent porta hepatis lymph nodes. IMPRESSION: Heterogeneous, increased echotexture throughout the liver. Subtle nodular contour seen in some areas of the liver. Findings suggest the possibility of cirrhosis. Perihepatic ascites and right pleural effusion. Mild biliary ductal dilatation, likely related to post cholecystectomy state. Prominent porta hepatis lymph  nodes, likely related to liver disease. Electronically Signed   By: Rolm Baptise M.D.   On: 07/02/2020 08:52   US THORACENTESIS ASP PLEURAL SPACE W/IMG GUIDE  Result Date: 07/08/2020  INDICATION: Lymphoma, remote colon and thyroid cancers, dyspnea, right pleural effusion.request for diagnostic and therapeutic thoracentesis. EXAM: ULTRASOUND GUIDED RIGHT THORACENTESIS MEDICATIONS: 1% lidocaine 10 mL COMPLICATIONS: None immediate. PROCEDURE: An ultrasound guided thoracentesis was thoroughly discussed with the patient and questions answered. The benefits, risks, alternatives and complications were also discussed. The patient understands and wishes to proceed with the procedure. Written consent was obtained. Ultrasound was performed to localize and mark an adequate pocket of fluid in the right chest. The area was then prepped and draped in the normal sterile fashion. 1% Lidocaine was used for local anesthesia. Under ultrasound guidance a 6 Fr Safe-T-Centesis catheter was introduced. Thoracentesis was performed. The catheter was removed and a dressing applied. FINDINGS: A total of approximately 500 mL of red fluid was removed. Samples were sent to the laboratory as requested by the clinical team. IMPRESSION: Successful ultrasound guided right thoracentesis yielding 500 mL of pleural fluid. Read by: Gareth Eagle, PA-C Electronically Signed   By: Jerilynn Mages.  Shick M.D.   On: 07/08/2020 16:31   US THORACENTESIS ASP PLEURAL SPACE W/IMG GUIDE  Result Date: 07/04/2020 INDICATION: Patient with history of lymphoma, remote colon and thyroid cancers, dyspnea, right pleural effusion. Request made for diagnostic and therapeutic right thoracentesis. EXAM: ULTRASOUND GUIDED DIAGNOSTIC AND THERAPEUTIC RIGHT THORACENTESIS MEDICATIONS: 1% lidocaine to skin and subcutaneous tissue COMPLICATIONS: None immediate. PROCEDURE: An ultrasound guided thoracentesis was thoroughly discussed with the patient and questions answered. The benefits,  risks, alternatives and complications were also discussed. The patient understands and wishes to proceed with the procedure. Written consent was obtained. Ultrasound was performed to localize and mark an adequate pocket of fluid in the right chest. The area was then prepped and draped in the normal sterile fashion. 1% Lidocaine was used for local anesthesia. Under ultrasound guidance a 6 Fr Safe-T-Centesis catheter was introduced. Thoracentesis was performed. The catheter was removed and a dressing applied. FINDINGS: A total of approximately 1.6 liters of blood-tinged fluid was removed. Samples were sent to the laboratory as requested by the clinical team. IMPRESSION: Successful ultrasound guided diagnostic and therapeutic right thoracentesis yielding 1.6 liters of pleural fluid. Read by: Rowe Robert, PA-C Electronically Signed   By: Lucrezia Europe M.D.   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 Status post weekly Rituxan x4 03/01/2012 through 03/22/2012. She completed 4 "maintenance" doses of Rituxan, last on 12/19/2012. A restaging CT on 02/09/2013 showed no evidence of lymphoma.   Lymph node lateral to the thyroid bed on a neck ultrasound 02/21/2014, status post an FNA biopsy concerning for a lymphoproliferative disorder.  PET scan 09/28/2016 with active lymphoma within the neck, chest, abdomen, pelvis; splenic enlargement and hypermetabolism suspicious for splenic involvement.  Initiation of Rituxan weekly 4 09/29/2016  Initiation of maintenance Rituxan on a 3 month schedule 12/23/2016; final Rituxan 08/31/2018  Thyroid ultrasound 02/07/2019-left cervical lymphadenopathy  PET scan 03/08/2019-extensive recurrent hypermetabolic lymphoma involving the neck, chest, abdomen and pelvis.  03/16/2019 left cervical lymph node biopsy-features consistent  with previously diagnosed non-Hodgkin's B-cell lymphoma, phenotypically consistent with marginal zone lymphoma. Flow cytometry with lambda restricted B-cell population without expression of CD5 or CD10 comprising 87% of all lymphocytes.  Cycle 1 bendamustine/Rituxan 03/22/2019  Excision deep left axillary lymph nodes 05/04/2019-non-Hodgkin's B-cell lymphoma with differential including a marginal zone lymphoma and atypical small lymphocytic lymphoma. Flow cytometry with monoclonal B-cell population without expression of CD5 or CD10,  comprises 96% of all lymphocytes.  Cycle 1 [facility]/Rituxan 05/18/2019  Cycle 2 [facility]/Rituxan 06/08/2019  CTs 06/15/2019-partial improvement in diffuse adenopathy, stable mild splenomegaly  Cycle 1 Revlimid/rituximab 07/19/2019 (Revlimid start 07/20/2019)  Cycle 2 Revlimid/rituximab 08/16/2019 (Revlimid placed on hold 08/30/2019 due to neutropenia)  Cycle 3 of Revlimid/rituximab 09/13/2019 (Revlimid schedule changed to 14 days on/14 days off)  Cycle 4 Revlimid/rituximab 10/10/2019  Cycle 5 Revlimid/rituximab 11/07/2019  Cycle 6 Revlimid/rituximab 12/07/2019  CTs 12/28/2019-diffuse lymphadenopathy-slightly increased compared to 06/15/2019  Cycle 7 Revlimid/rituximab 01/04/2020  Cycle 8 Revlimid/Rituxan 02/01/2020  Cycle 9 Revlimid/Rituxan 03/05/2020  Cycle 10 Revlimid/Rituxan 04/02/2020  Cycle 11 Revlimid/Rituxan 04/30/2020  CT 05/22/2020-mild increase in left supraclavicular adenopathy, mild increase in chest, retroperitoneal, and pelvic adenopathy. Stable mild splenomegaly.  Acalabrutinib started 06/19/2020  CT abdomen/pelvis 06/23/2020-moderate left and trace right pleural effusions, new diffuse mesenteric edema and ascites, stable retroperitoneal and iliac adenopathy, mild splenomegaly 2. Stage IV (T1bN1b M0) papillary thyroid cancer, status post a thyroidectomy with reimplantation of the left superior parathyroid gland on 05/23/2012, status post radioactive iodine  therapy, followed by Dr. Buddy Duty.  3. Stage II (T3 N0) colon cancer, status post a right colectomy 10/19/2011, last colonoscopy April 2015-sigmoid adenoma removed.  4. History of a pulmonary embolism December 2012.  5. History of Atrial fibrillation 6. Iron deficiency anemia-new 03/18/2014. Hemoccult positive stool. The anemia corrected with iron. No longer taking iron.  Status post an upper endoscopy and colonoscopy by Dr. Carlean Purl April 2015 with no bleeding source identified, benign adenoma removed from the sigmoid colon. 7. Report of an upper gastric intestinal bleed fall 2016-managed in [place]. [name] 8. Left knee replacement May 2017, repeat left knee surgery May 2018 9. Pruritic rash 07/22/2016 10. Nausea and diarrhea 04/02/2019-stool negative for C. difficile toxin 11. 06/18/2019 [facility] admission for symptomatic anemia. 12.  Admission 06/23/2020 with nausea and diarrhea 13.  Respiratory failure, bilateral pleural effusions  Right thoracentesis 07/04/2020-culture and cytology negative  CT chest 07/07/2020-right and left pleural effusions, bilateral basilar atelectasis, interlobular septal thickening in the right lung-asymmetric edema?  Improved adenopathy  Right thoracentesis 07/08/2020  Ms. Titus is more sedated this morning likely due to medications.  Unable to tell me if she continues to have dyspnea but she does appear to be somewhat short of breath this morning.  Remains on O2 at 4 L/min.  Urine output improving with diuresis.  The patient underwent repeat thoracentesis on 7/20 without significant improvement in her symptoms. Awaiting flow cytometry from pleural fluid removed on 7/20.  It is possible the pleural effusions are related to lymphoma but other findings this admission suggest improvement in the lymphoma.  The LDH is lower and the palpable lymphadenopathy has improved.  CT chest on 7/19 showed improvement in her chest lymphadenopathy.  Recommendations: 1.  Continue to hold  acalabrutinib 2.  Continue diuresis 3.  Flow cytometry if she has a repeat thoracentesis 4.  Out of bed as tolerated, assess ability to return home versus nursing facility placement 5.  Follow-up final culture result on 07/08/2020 pleural fluid-staph epidermidis   LOS: 17 days   Mikey Bussing 07/11/20  Ms. Mcquade was interviewed and examined.  She was very lethargic when I saw her this morning.  She is alert this afternoon.  She continues to have respiratory distress.  The pleural fluid is growing a Staph epidermidis, likely contaminant.  I am concerned the pleural fluid is related to lymphoma.  We will ask for flow cytometry if she has a repeat thoracentesis.  It appears flow cytometry was not obtained on the 07/08/2020 pleural fluid.  She continues to require a high nasal cannula flow rate and is not ready for discharge to home.  Please call oncology as needed over the weekend.  I will check on her 07/14/2020.

## 2020-07-11 NOTE — Progress Notes (Signed)
PT Cancellation Note  Patient Details Name: Tracy Mclaughlin MRN: [id number] DOB: 06-Nov-1934   Cancelled Treatment:     unable to arouse enough to participate.  Pt has been evaluated with rec for SNF.  Will continue to follow during her [facility] stay.    Nathanial Rancher 07/11/2020, 11:23 AM

## 2020-07-12 LAB — BASIC METABOLIC PANEL
Anion gap: 9 (ref 5–15)
BUN: 16 mg/dL (ref 8–23)
CO2: 41 mmol/L — ABNORMAL HIGH (ref 22–32)
Calcium: 7.3 mg/dL — ABNORMAL LOW (ref 8.9–10.3)
Chloride: 92 mmol/L — ABNORMAL LOW (ref 98–111)
Creatinine, Ser: 0.73 mg/dL (ref 0.44–1.00)
GFR calc Af Amer: >60 mL/min (ref >60)
GFR calc non Af Amer: >60 mL/min (ref >60)
Glucose, Bld: 108 mg/dL — ABNORMAL HIGH (ref 70–99)
Potassium: 3 mmol/L — ABNORMAL LOW (ref 3.5–5.1)
Sodium: 142 mmol/L (ref 135–145)

## 2020-07-12 LAB — CBC
HCT: 30.7 % — ABNORMAL LOW (ref 36.0–46.0)
Hemoglobin: 9.7 g/dL — ABNORMAL LOW (ref 12.0–15.0)
MCH: 33.7 pg (ref 26.0–34.0)
MCHC: 31.6 g/dL (ref 30.0–36.0)
MCV: 106.6 fL — ABNORMAL HIGH (ref 80.0–100.0)
Platelets: 126 10*3/uL — ABNORMAL LOW (ref 150–400)
RBC: 2.88 MIL/uL — ABNORMAL LOW (ref 3.87–5.11)
RDW: 14.5 % (ref 11.5–15.5)
WBC: 4.5 10*3/uL (ref 4.0–10.5)
nRBC: 0 % (ref 0.0–0.2)

## 2020-07-12 LAB — CULTURE, BODY FLUID W GRAM STAIN -BOTTLE: Special Requests: ADEQUATE

## 2020-07-12 LAB — CHOLESTEROL, BODY FLUID: Cholesterol, Fluid: 25 mg/dL

## 2020-07-12 MED ORDER — POTASSIUM CHLORIDE 10 MEQ/100ML IV SOLN
10.0000 meq | INTRAVENOUS | Status: AC
  Administered 2020-07-12 (×3): 10 meq via INTRAVENOUS
  Filled 2020-07-12 (×3): qty 100

## 2020-07-12 MED ORDER — POTASSIUM CHLORIDE 20 MEQ PO PACK
40.0000 meq | PACK | Freq: Once | ORAL | Status: AC
  Administered 2020-07-12: 40 meq via ORAL
  Filled 2020-07-12: qty 2

## 2020-07-12 NOTE — Progress Notes (Signed)
PROGRESS NOTE    Tracy Mclaughlin  JSH:[id number] DOB: August 31, 1934 DOA: 06/23/2020 PCP: [name], PA    Brief Narrative:  This patient is 86 y.o.femalewith medical history significant forlow-grade lymphoma without significant response to chemotherapy last year and was recently started onBTK INHIBITORby Dr. [name], hypothyroidism, history of A. fib, pancytopenia presents to the ED with3-4 day history of nausea vomiting and diarrhea and abdominal pain-and right upper quadrant pain.symptomts started after new chemo on Thursday.Patient denies any fever,  chest pain.vomitted x3 today and had non bloody diarrhea 4 times today.  ED Course:Hemodynamically stable, afebrile, UA positive for UTI, CT scan abdomen done that showed free fluid with ascites and mesenteric edema, case was discussed Dr. [name] from oncology,  advised UTI treatment with Zosyn .  Patient  was admitted, being managed symptomatically, chemo was held,  diarrhea improved subsequently,  oncology put the patient back on chemo overall tolerating well. Having nausea anorexia suspecting this is related to her lymphoma. Patient has been started on prednisone to help with appetite and also received trazodone for sleep. Patient remains deconditioned.  She was found to get more sleepy and confused subsequently trazodone and prednisone were discontinued with resolution of her confusion. Patient was also noticed to have shortness of breath/dyspnea with activity-imaging studies showed pleural effusion underwent right-sided thoracentesis with 1.6 L of blood-tinged fluid removal, cytology reactive medically stable, no growth on culture. PT had suggested skilled nursing facility,  patient had PICC line SNF as well as home health.  Patient went into rapid tachycardia supraventricular 7/19 a.m. and also with hypoxia-underwent CTA chest no PE but right-sided pleural effusion atelectasis/consolidation-cardiologist consulted.  Patient  converted to sinus rhythm spontaneously. received few days of IV antibiotics , Procalcitonin has been negative/normal- no fever no leukocytosis so stopped  7/20   Assessment & Plan:   Principal Problem:   Recurrent pleural effusion on right Active Problems:   Non Hodgkin's lymphoma (HCC)   Hypertension   Anemia, iron deficiency   PAF (paroxysmal atrial fibrillation) (HCC)   Anemia   Pancytopenia (HCC)   Nausea & vomiting   UTI (urinary tract infection)   Ascites   Acute respiratory failure with hypoxia and hypercapnia (HCC)  Acute hypoxic respiratory failure due to pleural effusion / No PE on the CT. -Stable, still requiring 4 L of oxygen, to maintain O2 sat greater than 92% -Currently satting 97% -Pleural cultures are growing gram-positive cocci -Initiating broad-spectrum antibiotics of Vanco and Zosyn  -we will modify and narrow down antibiotics according to final cultures and sensitivity.  -Remains in respiratory distress O2 dependent, hypoxic,  worsening shortness of breath with minimal exertion - Recurrent pleural effusion -07/08/2020 US guidedrightthoracentesis. Yielded500 mLof redfluid. CXR: 1. Increase in volume of right pleural effusion with mild interstitial edema 2. Bilateral airspace opacities concerning for multifocal pneumonia.  -Continue gentle diuretics -As needed duo nebs  -We will continue empiric antibiotics -Low suspicion for pneumonia, sepsis  Intractable nausea vomitingdiarrhea andabdominal pain: -Resolved -Unclear etiology suspecting due to [place] held on admission and resumed 7/8. Diarrhea improved.   Acalabrutinib  was again discontinued as her LFTs were worsening. Has new onset ascites on the CT scan-but too small to tap on the ultrasound . Cont supportive measure.  - Prednisone was added to stimulate appetite but discontinued due to confusion  Recurrent pleural effusion: -Likely from  Lymphoma Vs infection or 2/2  Acalabrutinib ( less likely per oncology) . S/p rt thoracentesis 1.6 L blood-tinged fluid was  removed.  Pleural fluid Gram stain no organism, culture no growth so far, cytology reactive mesothelial cell.   CTA shows moderate right and mild to moderate left pleural effusions-BNP around 400  -Echo shows preserved EJF 60 to 65%.  no diastolic dysfunction, unclear if related to CHF versus low Albumin.   Cardiology on board will start trial of IV Lasix low-dose, I's and O's: +10.1 L  - order US guided thoracentesis along with labs/cytology,discussed with cardio and  Oncology.  Narrow complex tachycardia 07/07/20 am- pt moved to tele.   Converted to sinus rhythm.  No recurrent episode -remained stable-Cardiology following closely -Continue diltiazem CD 180 nightly, Lopressor 12.5 mg p.o. twice daily, -Following thyroid function -2D echocardiogram reviewed  Ascites: -new onset ascites on the CT scan-too small to tap on paracentesis per IR.  Abnormal LFTS: -Improving 2/2 Acalabrutinib-which is discontinued.  LFTs improving.RUQ [place]- possible cirrhotic changes, hepatitis panel negative.   Acute metabolic encephalopathy from polypharmacy: -Mentation back to baseline  had  confusion/Lethargy: 7/14 a.m likley polypharmacy and pt feels improved after stopping trazodone/prednisone.   -Continue to complain of insomnia, sleeping medication as needed  She is on Xanax low-dose continue for anxiety.  Continue supportive care.   E coli UTI YJE:HUDJSHFWY 7 days of antibiotics.   Non Hodgkin's lymphoma -Remained stable, -Oncology following -with progressive decline, patientwasstartedpast weekonAcalabrutinib she did not have significant response to chemotherapy last year.Acalabrutinibwas held on admission and resumed  But again stopped due to worsening LFTs by oncology.   Failure to thrive/deconditioning: Remains weak and deconditioned.  -PT continues to recommend SNF -She refused SNF.  She is high  risk for readmission.  Husband concerned about poor support.  Hypertension: BP is controlled.  Continue Cardizem.    Pancytopenia with anemia/Thrombocytopenia in the setting of lymphoma/chemo: -Hemoglobin and WBC count overall stable.  Monitor. Heparin was discontinued by hematology due to bilateral lower leg bruise and thrombocytopenia.    History of intermittent atrial fibrillation on Cardizem: - Not on anticoagulation due to history of subarachnoid hemorrhage as per cardiology. Bruise on the skin heparin discontinued per oncology. improving.   DVT prophylaxis: Place and maintain sequential compression device Start: 06/30/20 0901 Heparin stopped due to  extensive LE bruise 7/12 and low platelet counts by hematology. Code Status: DNR Family Communication: Discussed with patient today.  Yesterday had extensive discussion with the patient's husband.  Status is: Inpatient Remains inpatient appropriate because: Patient remains deconditioned weak, hypoxic.  Dispo: The patient is from: Home   Anticipated d/c is to: SNF as per PT- pt declines SNF and HH.  Continued to engage on placement recomendations.  Anticipated d/c date is: 2 days.  Once shortness of better, and cleared by her oncologist.discussed with her oncology   Patient currently not medically stable.   Consultants:   Hematology  Procedures: Right thoracocentesis  Antimicrobials: Anti-infectives (From admission, onward)   Start     Dose/Rate Route Frequency Ordered Stop   07/10/20 1600  vancomycin (VANCOREADY) IVPB 750 mg/150 mL     Discontinue     750 mg 150 mL/hr over 60 Minutes Intravenous Every 12 hours 07/10/20 1332     07/10/20 1430  piperacillin-tazobactam (ZOSYN) IVPB 3.375 g     Discontinue     3.375 g 12.5 mL/hr over 240 Minutes Intravenous Every 8 hours 07/10/20 1332     07/06/20 1800  cefTRIAXone (ROCEPHIN) 1 g in sodium chloride 0.9 % 100 mL IVPB  Status:  Discontinued  1 g 200 mL/hr over 30 Minutes Intravenous Every 24 hours 07/06/20 1646 07/08/20 1037   07/06/20 1800  azithromycin (ZITHROMAX) tablet 500 mg  Status:  Discontinued        500 mg Oral Daily 07/06/20 1646 07/08/20 1037   06/27/20 0830  cefTRIAXone (ROCEPHIN) 1 g in sodium chloride 0.9 % 100 mL IVPB  Status:  Discontinued        1 g 200 mL/hr over 30 Minutes Intravenous Every 24 hours 06/27/20 0733 06/30/20 1340   06/24/20 0200  piperacillin-tazobactam (ZOSYN) IVPB 3.375 g  Status:  Discontinued        3.375 g 12.5 mL/hr over 240 Minutes Intravenous Every 8 hours 06/23/20 1703 06/27/20 0733   06/23/20 1645  piperacillin-tazobactam (ZOSYN) IVPB 3.375 g        3.375 g 100 mL/hr over 30 Minutes Intravenous  Once 06/23/20 1638 06/23/20 1834   06/23/20 1615  cefTRIAXone (ROCEPHIN) 1 g in sodium chloride 0.9 % 100 mL IVPB  Status:  Discontinued        1 g 200 mL/hr over 30 Minutes Intravenous  Once 06/23/20 1607 06/23/20 1608     Subjective: Patient was seen and examined at bedside.  No overnight events.  She appears comfortable.  Objective: Vitals:   07/12/20 0514 07/12/20 1047 07/12/20 1409 07/12/20 1500  BP: (!) 120/45  (!) 115/44   Pulse: 65  65   Resp: 20  (!) 27 22  Temp: 97.7 F (36.5 C)  98.2 F (36.8 C)   TempSrc: Oral  Oral   SpO2: 93% 94% 95%   Weight:      Height:        Intake/Output Summary (Last 24 hours) at 07/12/2020 1529 Last data filed at 07/12/2020 1409 Gross per 24 hour  Intake 380 ml  Output 3200 ml  Net -2820 ml   Filed Weights   07/10/20 1400 07/11/20 0449 07/12/20 0500  Weight: 76.1 kg 70.8 kg 71.1 kg    Examination:  General exam: Appears calm and comfortable  Respiratory system: Clear to auscultation. Respiratory effort normal.  Porta catheter on the chest. Cardiovascular system: S1 & S2 heard, RRR. No JVD, murmurs, rubs, gallops or clicks. No pedal edema. Gastrointestinal system: Abdomen is nondistended, soft and nontender. No organomegaly or  masses felt. Normal bowel sounds heard. Central nervous system: Alert and oriented. No focal neurological deficits. Extremities:  No pain, Bruising noted on LE Skin: No rashes, lesions or ulcers Psychiatry: Judgement and insight appear normal. Mood & affect appropriate.     Data Reviewed: I have personally reviewed following labs and imaging studies  CBC: Recent Labs  Lab 07/08/20 0340 07/09/20 0537 07/10/20 0322 07/11/20 0321 07/12/20 0400  WBC 3.7* 4.5 3.9* 4.4 4.5  HGB 10.5* 11.0* 10.8* 10.2* 9.7*  HCT 33.4* 33.9* 33.1* 32.3* 30.7*  MCV 108.1* 105.3* 105.8* 106.6* 106.6*  PLT 117* 130* 125* 124* 161*   Basic Metabolic Panel: Recent Labs  Lab 07/08/20 0340 07/09/20 0537 07/10/20 0322 07/11/20 0321 07/12/20 0400  NA 143 141 143 143 142  K 3.8 3.9 3.1* 3.2* 3.0*  CL 106 101 96* 93* 92*  CO2 30 34* 36* 38* 41*  GLUCOSE 109* 120* 103* 117* 108*  BUN 9 10 10 15 16   CREATININE 0.50 0.46 0.51 0.67 0.73  CALCIUM 7.3* 7.7* 7.6* 7.5* 7.3*  MG 1.7 1.7  --   --   --    GFR: Estimated Creatinine Clearance: 53.7 mL/min (by C-G formula based  on SCr of 0.73 mg/dL). Liver Function Tests: Recent Labs  Lab 07/06/20 0510 07/07/20 0544 07/08/20 0340  AST 64* 52* 37  ALT 35 31 26  ALKPHOS 166* 161* 156*  BILITOT 0.9 0.8 0.7  PROT 5.0* 5.1* 4.8*  ALBUMIN 2.5* 2.6* 2.6*   No results for input(s): LIPASE, AMYLASE in the last 168 hours. No results for input(s): AMMONIA in the last 168 hours. Coagulation Profile: No results for input(s): INR, PROTIME in the last 168 hours. Cardiac Enzymes: No results for input(s): CKTOTAL, CKMB, CKMBINDEX, TROPONINI in the last 168 hours. BNP (last 3 results) No results for input(s): PROBNP in the last 8760 hours. HbA1C: No results for input(s): HGBA1C in the last 72 hours. CBG: Recent Labs  Lab 07/08/20 0754 07/08/20 0824 07/08/20 1205 07/09/20 0753 07/09/20 1632  GLUCAP 108* 106* 115* 103* 100*   Lipid Profile: No results for  input(s): CHOL, HDL, LDLCALC, TRIG, CHOLHDL, LDLDIRECT in the last 72 hours. Thyroid Function Tests: Recent Labs    07/11/20 0321  TSH 0.466  FREET4 2.21*   Anemia Panel: No results for input(s): VITAMINB12, FOLATE, FERRITIN, TIBC, IRON, RETICCTPCT in the last 72 hours. Sepsis Labs: Recent Labs  Lab 07/06/20 1703 07/07/20 0544 07/08/20 0340 07/09/20 1559  PROCALCITON <0.10 <0.10 <0.10 <0.10    Recent Results (from the past 240 hour(s))  Body fluid culture     Status: None   Collection Time: 07/04/20 11:12 AM   Specimen: Lung, Right; Pleural Fluid  Result Value Ref Range Status   Specimen Description   Final    PLEURAL Performed at [facility], [place] [address] [place], [place] [address]    Special Requests   Final    NONE Performed at [facility], [place] [address] [place], [place] [address]    Gram Stain   Final    FEW WBC PRESENT, PREDOMINANTLY MONONUCLEAR NO ORGANISMS SEEN    Culture   Final    NO GROWTH 3 DAYS Performed at [place] [address] [place], [place] [address]    Report Status 07/07/2020 FINAL  Final  Gram stain     Status: None   Collection Time: 07/08/20  5:03 PM   Specimen: Fluid  Result Value Ref Range Status   Specimen Description FLUID PLEURAL  Final   Special Requests   Final    BOTTLES DRAWN AEROBIC AND ANAEROBIC Blood Culture adequate volume   Gram Stain   Final    RARE WBC PRESENT, PREDOMINANTLY MONONUCLEAR NO ORGANISMS SEEN Performed at [facility], [place] [address] [place], [place] [address]    Report Status 07/08/2020 FINAL  Final  Culture, body fluid-bottle     Status: Abnormal   Collection Time: 07/08/20  5:04 PM   Specimen: Fluid  Result Value Ref Range Status   Specimen Description FLUID PLEURAL  Final   Special Requests   Final    BOTTLES DRAWN AEROBIC AND ANAEROBIC Blood Culture adequate volume   Gram Stain   Final    GRAM POSITIVE COCCI IN CLUSTERS AEROBIC BOTTLE  ONLY CRITICAL RESULT CALLED TO, READ BACK BY AND VERIFIED WITH: RN RUTH M. [id number] FCP Performed at [facility], [address], [place], [place] [address]    Culture STAPHYLOCOCCUS EPIDERMIDIS (A)  Final   Report Status 07/12/2020 FINAL  Final   Organism ID, Bacteria STAPHYLOCOCCUS EPIDERMIDIS  Final      Susceptibility   Staphylococcus epidermidis - MIC*  CIPROFLOXACIN 2 INTERMEDIATE Intermediate     ERYTHROMYCIN >=8 RESISTANT Resistant     GENTAMICIN 2 SENSITIVE Sensitive     OXACILLIN >=4 RESISTANT Resistant     TETRACYCLINE >=16 RESISTANT Resistant     VANCOMYCIN 2 SENSITIVE Sensitive     TRIMETH/SULFA 20 SENSITIVE Sensitive     CLINDAMYCIN <=0.25 SENSITIVE Sensitive     RIFAMPIN <=0.5 SENSITIVE Sensitive     Inducible Clindamycin NEGATIVE Sensitive     * STAPHYLOCOCCUS EPIDERMIDIS     Radiology Studies: No results found.  Scheduled Meds: . Chlorhexidine Gluconate Cloth  6 each Topical Daily  . citalopram  20 mg Oral Daily  . diltiazem  180 mg Oral QHS  . feeding supplement (KATE FARMS STANDARD 1.4)  325 mL Oral BID BM  . feeding supplement (PRO-STAT SUGAR FREE 64)  30 mL Oral Daily  . furosemide  20 mg Intravenous BID  . heparin lock flush  500 Units Intracatheter Q30 days  . levothyroxine  224 mcg Oral Q0600  . melatonin  3 mg Oral QHS  . metoprolol tartrate  12.5 mg Oral BID  . multivitamin with minerals  1 tablet Oral Daily  . pantoprazole  40 mg Oral Daily   Continuous Infusions: . sodium chloride 10 mL/hr at 07/07/20 0945  . piperacillin-tazobactam (ZOSYN)  IV 3.375 g (07/12/20 1310)  . vancomycin 750 mg (07/12/20 0430)     LOS: 18 days    Time spent: 25 mins.    Shawna Clamp, MD [facility]   If 7PM-7AM, please contact night-coverage

## 2020-07-13 LAB — CBC
HCT: 31.4 % — ABNORMAL LOW (ref 36.0–46.0)
Hemoglobin: 9.9 g/dL — ABNORMAL LOW (ref 12.0–15.0)
MCH: 33.4 pg (ref 26.0–34.0)
MCHC: 31.5 g/dL (ref 30.0–36.0)
MCV: 106.1 fL — ABNORMAL HIGH (ref 80.0–100.0)
Platelets: 123 10*3/uL — ABNORMAL LOW (ref 150–400)
RBC: 2.96 MIL/uL — ABNORMAL LOW (ref 3.87–5.11)
RDW: 14.6 % (ref 11.5–15.5)
WBC: 4.6 10*3/uL (ref 4.0–10.5)
nRBC: 0 % (ref 0.0–0.2)

## 2020-07-13 LAB — BASIC METABOLIC PANEL
Anion gap: 13 (ref 5–15)
BUN: 16 mg/dL (ref 8–23)
CO2: 40 mmol/L — ABNORMAL HIGH (ref 22–32)
Calcium: 7 mg/dL — ABNORMAL LOW (ref 8.9–10.3)
Chloride: 87 mmol/L — ABNORMAL LOW (ref 98–111)
Creatinine, Ser: 0.69 mg/dL (ref 0.44–1.00)
GFR calc Af Amer: >60 mL/min (ref >60)
GFR calc non Af Amer: >60 mL/min (ref >60)
Glucose, Bld: 106 mg/dL — ABNORMAL HIGH (ref 70–99)
Potassium: 2.9 mmol/L — ABNORMAL LOW (ref 3.5–5.1)
Sodium: 140 mmol/L (ref 135–145)

## 2020-07-13 LAB — MAGNESIUM: Magnesium: 1.4 mg/dL — ABNORMAL LOW (ref 1.7–2.4)

## 2020-07-13 LAB — PHOSPHORUS: Phosphorus: 3.8 mg/dL (ref 2.5–4.6)

## 2020-07-13 MED ORDER — MAGNESIUM SULFATE 2 GM/50ML IV SOLN
2.0000 g | Freq: Once | INTRAVENOUS | Status: AC
  Administered 2020-07-13: 2 g via INTRAVENOUS
  Filled 2020-07-13: qty 50

## 2020-07-13 MED ORDER — POTASSIUM CHLORIDE 10 MEQ/100ML IV SOLN
10.0000 meq | INTRAVENOUS | Status: AC
  Administered 2020-07-13 (×2): 10 meq via INTRAVENOUS
  Filled 2020-07-13 (×2): qty 100

## 2020-07-13 MED ORDER — POTASSIUM CHLORIDE 20 MEQ PO PACK
40.0000 meq | PACK | Freq: Once | ORAL | Status: AC
  Administered 2020-07-13: 40 meq via ORAL
  Filled 2020-07-13: qty 2

## 2020-07-13 NOTE — Progress Notes (Signed)
PROGRESS NOTE    Tracy Mclaughlin  QIW:[id number] DOB: 06-28-1934 DOA: 06/23/2020 PCP: [name], PA    Brief Narrative:  This patient is 86 y.o.femalewith medical history significant forlow-grade lymphoma without significant response to chemotherapy last year and was recently started onBTK INHIBITORby Dr. [name], hypothyroidism, history of A. fib, pancytopenia presents to the ED with3-4 day history of nausea vomiting and diarrhea and abdominal pain-and right upper quadrant pain.symptoms started after new chemo on Thursday.Patient denies any fever,  chest pain.vomitted x3 today and had non bloody diarrhea 4 times today.  ED Course:Hemodynamically stable, afebrile, UA positive for UTI, CT scan abdomen done that showed free fluid with ascites and mesenteric edema, case was discussed Dr. [name] from oncology,  advised UTI treatment with Zosyn .  Patient  was admitted, being managed symptomatically, chemo was held,  diarrhea improved subsequently,  oncology put the patient back on chemo overall tolerating well. Having nausea anorexia suspecting this is related to her lymphoma. Patient has been started on prednisone to help with appetite and also received trazodone for sleep. Patient remains deconditioned.  She was found to get more sleepy and confused subsequently trazodone and prednisone were discontinued with resolution of her confusion. Patient was also noticed to have shortness of breath/dyspnea with activity-imaging studies showed pleural effusion underwent right-sided thoracentesis with 1.6 L of blood-tinged fluid removal, cytology reactive medically stable, no growth on culture. PT had suggested skilled nursing facility,  patient had PICC line, plan:  SNF as well as home health.  Patient went into rapid tachycardia supraventricular 7/19 a.m. and also with hypoxia-underwent CTA chest no PE but right-sided pleural effusion atelectasis/consolidation-cardiologist consulted.   Patient converted to sinus rhythm spontaneously. received few days of IV antibiotics , Procalcitonin has been negative/normal- no fever no leukocytosis so stopped antibiotics. 7/20   Assessment & Plan:   Principal Problem:   Recurrent pleural effusion on right Active Problems:   Non Hodgkin's lymphoma (HCC)   Hypertension   Anemia, iron deficiency   PAF (paroxysmal atrial fibrillation) (HCC)   Anemia   Pancytopenia (HCC)   Nausea & vomiting   UTI (urinary tract infection)   Ascites   Acute respiratory failure with hypoxia and hypercapnia (HCC)  Acute hypoxic respiratory failure due to pleural effusion / No PE on the CT. -Stable, still requiring 4 L of oxygen, to maintain O2 sat greater than 92% -Currently satting 97% -Pleural cultures are growing gram-positive cocci -Initiating broad-spectrum antibiotics of Vanco and Zosyn  -we will modify and narrow down antibiotics according to final cultures and sensitivity.  -Remains in respiratory distress O2 dependent, hypoxic,  worsening shortness of breath with minimal exertion - Recurrent pleural effusion -07/08/2020 US guidedrightthoracentesis. Yielded500 mLof redfluid. CXR: 1. Increase in volume of right pleural effusion with mild interstitial edema 2. Bilateral airspace opacities concerning for multifocal pneumonia.  -Continue gentle diuretics -As needed duo nebs  -We will continue empiric antibiotics -Low suspicion for pneumonia, sepsis.  Intractable nausea vomitingdiarrhea andabdominal pain: -Resolved -Unclear etiology suspecting due to [place] held on admission and resumed 7/8. Diarrhea improved.   Acalabrutinib  was again discontinued as her LFTs were worsening. Has new onset ascites on the CT scan-but too small to tap on the ultrasound . Cont supportive measure.  - Prednisone was added to stimulate appetite but discontinued due to confusion  Recurrent pleural effusion: -Likely from  Lymphoma Vs  infection or 2/2 Acalabrutinib ( less likely per oncology) . S/p rt thoracentesis 1.6 L blood-tinged  fluid was removed.  Pleural fluid Gram stain no organism, culture no growth so far, cytology reactive mesothelial cell.   CTA shows moderate right and mild to moderate left pleural effusions-BNP around 400  -Echo shows preserved EJF 60 to 65%.  no diastolic dysfunction, unclear if related to CHF versus low Albumin.   Cardiology on board will start trial of IV Lasix low-dose, I's and O's: +10.1 L  - order US guided thoracentesis along with labs/cytology,discussed with cardio and  Oncology.  Narrow complex tachycardia 07/07/20 am- pt moved to tele.   Converted to sinus rhythm.  No recurrent episode -remained stable-Cardiology following closely -Continue diltiazem CD 180 nightly, Lopressor 12.5 mg p.o. twice daily, -Following thyroid function -2D echocardiogram reviewed.  Ascites: -new onset ascites on the CT scan-too small to tap on paracentesis per IR.  Abnormal LFTS: -Improving 2/2 Acalabrutinib-which is discontinued.  LFTs improving.RUQ [place]- possible cirrhotic changes, hepatitis panel negative.   Acute metabolic encephalopathy from polypharmacy: -Mentation back to baseline  had  confusion/Lethargy: 7/14 a.m likley polypharmacy and pt feels improved after stopping trazodone/prednisone.   -Continue to complain of insomnia, sleeping medication as needed  She is on Xanax low-dose continue for anxiety.  Continue supportive care.   E coli UTI TGG:YIRSWNIOE 7 days of antibiotics.   Non Hodgkin's lymphoma -Remained stable, -Oncology following -with progressive decline, patientwasstartedpast weekonAcalabrutinib she did not have significant response to chemotherapy last year.Acalabrutinibwas held on admission and resumed  But again stopped due to worsening LFTs by oncology.   Failure to thrive/deconditioning: Remains weak and deconditioned.  -PT continues to recommend SNF -She refused  SNF.  She is high risk for readmission.  Husband concerned about poor support.  Hypertension: BP is controlled.  Continue Cardizem.    Pancytopenia with anemia/Thrombocytopenia in the setting of lymphoma/chemo: -Hemoglobin and WBC count overall stable.  Monitor. Heparin was discontinued by hematology due to bilateral lower leg bruise and thrombocytopenia.    History of intermittent atrial fibrillation on Cardizem: - Not on anticoagulation due to history of subarachnoid hemorrhage as per cardiology. Bruise on the skin heparin discontinued per oncology. improving.   DVT prophylaxis: Place and maintain sequential compression device Start: 06/30/20 0901 Heparin stopped due to  extensive LE bruise 7/12 and low platelet counts by hematology. Code Status: DNR Family Communication: Discussed with patient today.  Yesterday had extensive discussion with the patient's husband.  Status is: Inpatient Remains inpatient appropriate because: Patient remains deconditioned weak, hypoxic.  Dispo: The patient is from: Home   Anticipated d/c is to: SNF as per PT- pt declines SNF and HH.  Continued to engage on placement recomendations.  Anticipated d/c date is: 2 days.  Once shortness of better, and cleared by her oncologist.discussed with her oncology   Patient currently not medically stable.   Consultants:   Hematology  Procedures: Right thoracocentesis  Antimicrobials: Anti-infectives (From admission, onward)   Start     Dose/Rate Route Frequency Ordered Stop   07/10/20 1600  vancomycin (VANCOREADY) IVPB 750 mg/150 mL     Discontinue     750 mg 150 mL/hr over 60 Minutes Intravenous Every 12 hours 07/10/20 1332     07/10/20 1430  piperacillin-tazobactam (ZOSYN) IVPB 3.375 g     Discontinue     3.375 g 12.5 mL/hr over 240 Minutes Intravenous Every 8 hours 07/10/20 1332     07/06/20 1800  cefTRIAXone (ROCEPHIN) 1 g in sodium chloride 0.9 % 100 mL IVPB  Status:   Discontinued  1 g 200 mL/hr over 30 Minutes Intravenous Every 24 hours 07/06/20 1646 07/08/20 1037   07/06/20 1800  azithromycin (ZITHROMAX) tablet 500 mg  Status:  Discontinued        500 mg Oral Daily 07/06/20 1646 07/08/20 1037   06/27/20 0830  cefTRIAXone (ROCEPHIN) 1 g in sodium chloride 0.9 % 100 mL IVPB  Status:  Discontinued        1 g 200 mL/hr over 30 Minutes Intravenous Every 24 hours 06/27/20 0733 06/30/20 1340   06/24/20 0200  piperacillin-tazobactam (ZOSYN) IVPB 3.375 g  Status:  Discontinued        3.375 g 12.5 mL/hr over 240 Minutes Intravenous Every 8 hours 06/23/20 1703 06/27/20 0733   06/23/20 1645  piperacillin-tazobactam (ZOSYN) IVPB 3.375 g        3.375 g 100 mL/hr over 30 Minutes Intravenous  Once 06/23/20 1638 06/23/20 1834   06/23/20 1615  cefTRIAXone (ROCEPHIN) 1 g in sodium chloride 0.9 % 100 mL IVPB  Status:  Discontinued        1 g 200 mL/hr over 30 Minutes Intravenous  Once 06/23/20 1607 06/23/20 1608     Subjective: Patient was seen and examined at bedside.  No overnight events.  She appears comfortable.she doesn't want to be discharged to SNF.  Objective: Vitals:   07/12/20 1636 07/12/20 1750 07/13/20 0215 07/13/20 0800  BP: (!) 115/46  (!) 109/46 (!) 117/47  Pulse: 66   65  Resp: (!) 28 22  23   Temp: 98.3 F (36.8 C)  97.8 F (36.6 C)   TempSrc: Oral  Oral   SpO2: 96%   90%  Weight:      Height:        Intake/Output Summary (Last 24 hours) at 07/13/2020 1352 Last data filed at 07/13/2020 0900 Gross per 24 hour  Intake 710 ml  Output 600 ml  Net 110 ml   Filed Weights   07/10/20 1400 07/11/20 0449 07/12/20 0500  Weight: 76.1 kg 70.8 kg 71.1 kg    Examination:  General exam: Appears calm and comfortable  Respiratory system: Clear to auscultation. Respiratory effort normal.  Porta catheter on the chest. Cardiovascular system: S1 & S2 heard, RRR. No JVD, murmurs, rubs, gallops or clicks. No pedal edema. Gastrointestinal system:  Abdomen is nondistended, soft and nontender. No organomegaly or masses felt. Normal bowel sounds heard. Central nervous system: Alert and oriented. No focal neurological deficits. Extremities:  No pain, Bruising noted on LE Skin: No rashes, lesions or ulcers Psychiatry: Judgement and insight appear normal. Mood & affect appropriate.     Data Reviewed: I have personally reviewed following labs and imaging studies  CBC: Recent Labs  Lab 07/09/20 0537 07/10/20 0322 07/11/20 0321 07/12/20 0400 07/13/20 0442  WBC 4.5 3.9* 4.4 4.5 4.6  HGB 11.0* 10.8* 10.2* 9.7* 9.9*  HCT 33.9* 33.1* 32.3* 30.7* 31.4*  MCV 105.3* 105.8* 106.6* 106.6* 106.1*  PLT 130* 125* 124* 126* 376*   Basic Metabolic Panel: Recent Labs  Lab 07/08/20 0340 07/08/20 0340 07/09/20 0537 07/10/20 0322 07/11/20 0321 07/12/20 0400 07/13/20 0442  NA 143   < > 141 143 143 142 140  K 3.8   < > 3.9 3.1* 3.2* 3.0* 2.9*  CL 106   < > 101 96* 93* 92* 87*  CO2 30   < > 34* 36* 38* 41* 40*  GLUCOSE 109*   < > 120* 103* 117* 108* 106*  BUN 9   < > 10 10  15 16 16   CREATININE 0.50   < > 0.46 0.51 0.67 0.73 0.69  CALCIUM 7.3*   < > 7.7* 7.6* 7.5* 7.3* 7.0*  MG 1.7  --  1.7  --   --   --  1.4*  PHOS  --   --   --   --   --   --  3.8   < > = values in this interval not displayed.   GFR: Estimated Creatinine Clearance: 53.7 mL/min (by C-G formula based on SCr of 0.69 mg/dL). Liver Function Tests: Recent Labs  Lab 07/07/20 0544 07/08/20 0340  AST 52* 37  ALT 31 26  ALKPHOS 161* 156*  BILITOT 0.8 0.7  PROT 5.1* 4.8*  ALBUMIN 2.6* 2.6*   No results for input(s): LIPASE, AMYLASE in the last 168 hours. No results for input(s): AMMONIA in the last 168 hours. Coagulation Profile: No results for input(s): INR, PROTIME in the last 168 hours. Cardiac Enzymes: No results for input(s): CKTOTAL, CKMB, CKMBINDEX, TROPONINI in the last 168 hours. BNP (last 3 results) No results for input(s): PROBNP in the last 8760  hours. HbA1C: No results for input(s): HGBA1C in the last 72 hours. CBG: Recent Labs  Lab 07/08/20 0754 07/08/20 0824 07/08/20 1205 07/09/20 0753 07/09/20 1632  GLUCAP 108* 106* 115* 103* 100*   Lipid Profile: No results for input(s): CHOL, HDL, LDLCALC, TRIG, CHOLHDL, LDLDIRECT in the last 72 hours. Thyroid Function Tests: Recent Labs    07/11/20 0321  TSH 0.466  FREET4 2.21*   Anemia Panel: No results for input(s): VITAMINB12, FOLATE, FERRITIN, TIBC, IRON, RETICCTPCT in the last 72 hours. Sepsis Labs: Recent Labs  Lab 07/06/20 1703 07/07/20 0544 07/08/20 0340 07/09/20 1559  PROCALCITON <0.10 <0.10 <0.10 <0.10    Recent Results (from the past 240 hour(s))  Body fluid culture     Status: None   Collection Time: 07/04/20 11:12 AM   Specimen: Lung, Right; Pleural Fluid  Result Value Ref Range Status   Specimen Description   Final    PLEURAL Performed at [facility], [place] [address], [place], [place] [address]    Special Requests   Final    NONE Performed at [facility], [place] [address], [place], [place] [address]    Gram Stain   Final    FEW WBC PRESENT, PREDOMINANTLY MONONUCLEAR NO ORGANISMS SEEN    Culture   Final    NO GROWTH 3 DAYS Performed at [place] [address], [place], [place] [address]    Report Status 07/07/2020 FINAL  Final  Gram stain     Status: None   Collection Time: 07/08/20  5:03 PM   Specimen: Fluid  Result Value Ref Range Status   Specimen Description FLUID PLEURAL  Final   Special Requests   Final    BOTTLES DRAWN AEROBIC AND ANAEROBIC Blood Culture adequate volume   Gram Stain   Final    RARE WBC PRESENT, PREDOMINANTLY MONONUCLEAR NO ORGANISMS SEEN Performed at [facility], [place] [address], [place], [place] [address]    Report Status 07/08/2020 FINAL  Final  Culture, body fluid-bottle     Status: Abnormal   Collection Time: 07/08/20  5:04 PM   Specimen: Fluid  Result  Value Ref Range Status   Specimen Description FLUID PLEURAL  Final   Special Requests   Final    BOTTLES DRAWN AEROBIC AND ANAEROBIC Blood Culture adequate volume   Gram Stain  Final    GRAM POSITIVE COCCI IN CLUSTERS AEROBIC BOTTLE ONLY CRITICAL RESULT CALLED TO, READ BACK BY AND VERIFIED WITH: RN RUTH M. [id number] FCP Performed at [facility], [place] [address] [place],  [address]    Culture STAPHYLOCOCCUS EPIDERMIDIS (A)  Final   Report Status 07/12/2020 FINAL  Final   Organism ID, Bacteria STAPHYLOCOCCUS EPIDERMIDIS  Final      Susceptibility   Staphylococcus epidermidis - MIC*    CIPROFLOXACIN 2 INTERMEDIATE Intermediate     ERYTHROMYCIN >=8 RESISTANT Resistant     GENTAMICIN 2 SENSITIVE Sensitive     OXACILLIN >=4 RESISTANT Resistant     TETRACYCLINE >=16 RESISTANT Resistant     VANCOMYCIN 2 SENSITIVE Sensitive     TRIMETH/SULFA 20 SENSITIVE Sensitive     CLINDAMYCIN <=0.25 SENSITIVE Sensitive     RIFAMPIN <=0.5 SENSITIVE Sensitive     Inducible Clindamycin NEGATIVE Sensitive     * STAPHYLOCOCCUS EPIDERMIDIS     Radiology Studies: No results found.  Scheduled Meds: . Chlorhexidine Gluconate Cloth  6 each Topical Daily  . citalopram  20 mg Oral Daily  . diltiazem  180 mg Oral QHS  . feeding supplement (KATE [place]ARMS STANDARD 1.4)  325 mL Oral BID BM  . feeding supplement (PRO-STAT SUGAR FREE 64)  30 mL Oral Daily  . furosemide  20 mg Intravenous BID  . heparin lock flush  500 Units Intracatheter Q30 days  . levothyroxine  224 mcg Oral Q0600  . melatonin  3 mg Oral QHS  . metoprolol tartrate  12.5 mg Oral BID  . multivitamin with minerals  1 tablet Oral Daily  . pantoprazole  40 mg Oral Daily   Continuous Infusions: . sodium chloride 10 mL/hr at 07/07/20 0945  . piperacillin-tazobactam (ZOSYN)  IV 3.375 g (07/13/20 1306)  . vancomycin 750 mg (07/13/20 0413)     LOS: 19 days    Time spent: 25 mins.    Shawna Clamp, MD [facility]   If 7PM-7AM, please contact night-coverage

## 2020-07-13 NOTE — Plan of Care (Signed)

## 2020-07-13 NOTE — Progress Notes (Signed)
Pharmacy Antibiotic Note  Tracy Mclaughlin is a 86 y.o. female with B-cell lymphoma on   on 06/23/2020 on acalabrutinib PTA, presented to the ED on 7/5 with c/o n/v/d and abdominal pain.  She has recurrent pleural effusion with thoracentesis performed on 7/20.  Pleural fluid culture grew staph epi. Dr. Gearldine Shown note on 7/23 indicated that this may be contaminant.  CXR on 7/21 showed "bilateral airspace opacities concerning for multifocal pneumonia."  She's currently on vancomycin and zosyn for broad coverage.  Today, 07/13/2020: - day #4 abx - afeb, wbc wnl - scr stable (crcl~53)  Plan: - continue vancomycin 750 mg IV q12h and zosyn 3.375 gm IV q8h (infuse over 4 hrs) - f/u renal function, clinical status and plan/LOT for abx - pharmacy will plan on checking vancomycin level if plan if to treat with vancomycin for >7 days.  __________________________________________  Height: 5\' 8"  (172.7 cm) Weight: 71.1 kg (156 lb 12 oz) IBW/kg (Calculated) : 63.9  Temp (24hrs), Avg:98.1 F (36.7 C), Min:97.8 F (36.6 C), Max:98.3 F (36.8 C)  Recent Labs  Lab 07/09/20 0537 07/10/20 0322 07/11/20 0321 07/12/20 0400 07/13/20 0442  WBC 4.5 3.9* 4.4 4.5 4.6  CREATININE 0.46 0.51 0.67 0.73 0.69    Estimated Creatinine Clearance: 53.7 mL/min (by C-G formula based on SCr of 0.69 mg/dL).    Allergies  Allergen Reactions  . Demerol [Meperidine Hcl] Nausea And Vomiting    Antimicrobials this admission:  7/5 Zosyn >> 7/9>> resumed 7/22>> 7/9 CTX >> 7/12 7/22 Vanc >>    Microbiology results:  7/5 BCx: NGF 7/5 UCx: EColi- R Amp/Unasyn, sens to all others 7/5 C diff: neg 7/20 Pleural Fluid: staph epi (S= clin, gent, rif, bactrim, vanc; R=eryth, tetra, oxa; I= cipro) FINAL  Thank you for allowing pharmacy to be a part of this patient's care.  Lynelle Doctor 07/13/2020 10:45 AM

## 2020-07-14 ENCOUNTER — Inpatient Hospital Stay (HOSPITAL_COMMUNITY): Payer: Medicare PPO

## 2020-07-14 LAB — COMP PANEL: LEUKEMIA/LYMPHOMA: Immunophenotypic Profile: 9

## 2020-07-14 LAB — BASIC METABOLIC PANEL
Anion gap: 12 (ref 5–15)
BUN: 14 mg/dL (ref 8–23)
CO2: 40 mmol/L — ABNORMAL HIGH (ref 22–32)
Calcium: 7 mg/dL — ABNORMAL LOW (ref 8.9–10.3)
Chloride: 84 mmol/L — ABNORMAL LOW (ref 98–111)
Creatinine, Ser: 0.68 mg/dL (ref 0.44–1.00)
GFR calc Af Amer: >60 mL/min (ref >60)
GFR calc non Af Amer: >60 mL/min (ref >60)
Glucose, Bld: 107 mg/dL — ABNORMAL HIGH (ref 70–99)
Potassium: 2.8 mmol/L — ABNORMAL LOW (ref 3.5–5.1)
Sodium: 136 mmol/L (ref 135–145)

## 2020-07-14 LAB — CBC
HCT: 32.6 % — ABNORMAL LOW (ref 36.0–46.0)
Hemoglobin: 10.4 g/dL — ABNORMAL LOW (ref 12.0–15.0)
MCH: 34 pg (ref 26.0–34.0)
MCHC: 31.9 g/dL (ref 30.0–36.0)
MCV: 106.5 fL — ABNORMAL HIGH (ref 80.0–100.0)
Platelets: 128 10*3/uL — ABNORMAL LOW (ref 150–400)
RBC: 3.06 MIL/uL — ABNORMAL LOW (ref 3.87–5.11)
RDW: 14.6 % (ref 11.5–15.5)
WBC: 4.8 10*3/uL (ref 4.0–10.5)
nRBC: 0 % (ref 0.0–0.2)

## 2020-07-14 MED ORDER — POTASSIUM CHLORIDE 10 MEQ/100ML IV SOLN
10.0000 meq | INTRAVENOUS | Status: AC
  Administered 2020-07-14 (×4): 10 meq via INTRAVENOUS
  Filled 2020-07-14 (×4): qty 100

## 2020-07-14 MED ORDER — POTASSIUM CHLORIDE 20 MEQ PO PACK
40.0000 meq | PACK | Freq: Two times a day (BID) | ORAL | Status: AC
  Administered 2020-07-14: 40 meq via ORAL
  Filled 2020-07-14: qty 2

## 2020-07-14 MED ORDER — ALPRAZOLAM 0.25 MG PO TABS
0.1250 mg | ORAL_TABLET | Freq: Two times a day (BID) | ORAL | Status: DC | PRN
  Administered 2020-07-14 – 2020-07-25 (×14): 0.125 mg via ORAL
  Filled 2020-07-14 (×15): qty 1

## 2020-07-14 NOTE — Progress Notes (Signed)
Up to chair with assist x 2.  Did better than I expected.  Alert and oriented.

## 2020-07-14 NOTE — Progress Notes (Addendum)
HEMATOLOGY-ONCOLOGY PROGRESS NOTE  SUBJECTIVE: Appears very weak this morning.  Nursing attempted to decrease O2 from 4 L to 3 L but sats dropped to the mid 80s so her O2 was increased again to 4 L/min via nasal cannula.  She still reports ongoing dyspnea.  She offers no other complaints this morning.  Oncology History  Non Hodgkin's lymphoma ([place])  09/29/2016 Initial Diagnosis   Non Hodgkin's lymphoma ([place])   03/22/2019 - 04/23/2019 Chemotherapy   The patient had palonosetron (ALOXI) injection 0.25 mg, 0.25 mg, Intravenous,  Once, 1 of 4 cycles Administration: 0.25 mg (03/22/2019) riTUXimab (RITUXAN) 800 mg in sodium chloride 0.9 % 250 mL (2.4242 mg/mL) infusion, 375 mg/m2 = 800 mg, Intravenous,  Once, 1 of 4 cycles Administration: 800 mg (03/22/2019) bendamustine (BENDEKA) 150 mg in sodium chloride 0.9 % 50 mL (2.6786 mg/mL) chemo infusion, 70 mg/m2 = 150 mg (100 % of original dose 70 mg/m2), Intravenous,  Once, 1 of 4 cycles Dose modification: 70 mg/m2 (original dose 70 mg/m2, Cycle 1, Reason: Provider Judgment) Administration: 150 mg (03/22/2019), 150 mg (03/23/2019)  for chemotherapy treatment.    05/18/2019 - 06/28/2019 Chemotherapy   The patient had DOXOrubicin (ADRIAMYCIN) chemo injection 82 mg, 40 mg/m2 = 82 mg (100 % of original dose 40 mg/m2), Intravenous,  Once, 2 of 6 cycles Dose modification: 40 mg/m2 (original dose 40 mg/m2, Cycle 1, Reason: Provider Judgment) Administration: 82 mg (05/18/2019), 82 mg (06/08/2019) palonosetron (ALOXI) injection 0.25 mg, 0.25 mg, Intravenous,  Once, 2 of 6 cycles Administration: 0.25 mg (05/18/2019), 0.25 mg (06/08/2019) pegfilgrastim (NEULASTA ONPRO KIT) injection 6 mg, 6 mg, Subcutaneous, Once, 2 of 6 cycles Administration: 6 mg (05/18/2019), 6 mg (06/08/2019) vinCRIStine (ONCOVIN) 1 mg in sodium chloride 0.9 % 50 mL chemo infusion, 1 mg (100 % of original dose 1 mg), Intravenous,  Once, 2 of 6 cycles Dose modification: 1 mg (original dose 1 mg, Cycle 1,  Reason: Provider Judgment) Administration: 1 mg (05/18/2019), 1 mg (06/08/2019) riTUXimab (RITUXAN) 800 mg in sodium chloride 0.9 % 250 mL (2.4242 mg/mL) infusion, 375 mg/m2 = 800 mg, Intravenous,  Once, 2 of 6 cycles Administration: 800 mg (05/18/2019), 800 mg (06/08/2019) cyclophosphamide (CYTOXAN) 1,240 mg in sodium chloride 0.9 % 250 mL chemo infusion, 600 mg/m2 = 1,540 mg, Intravenous,  Once, 2 of 6 cycles Dose modification: 600 mg/m2 (original dose 750 mg/m2, Cycle 2, Reason: Provider Judgment) Administration: 1,240 mg (05/18/2019), 1,240 mg (06/08/2019) fosaprepitant (EMEND) 150 mg, dexamethasone (DECADRON) 12 mg in sodium chloride 0.9 % 145 mL IVPB, , Intravenous,  Once, 2 of 6 cycles Administration:  (05/18/2019),  (06/08/2019)  for chemotherapy treatment.    07/19/2019 -  Chemotherapy   The patient had riTUXimab (RITUXAN) 700 mg in sodium chloride 0.9 % 250 mL (2.1875 mg/mL) infusion, 375 mg/m2 = 700 mg, Intravenous,  Once, 11 of 11 cycles Administration: 700 mg (07/19/2019), 700 mg (08/16/2019), 700 mg (09/13/2019), 700 mg (10/10/2019), 700 mg (11/07/2019), 700 mg (12/07/2019), 700 mg (01/04/2020), 700 mg (02/01/2020), 700 mg (03/05/2020), 700 mg (04/02/2020), 700 mg (04/30/2020) riTUXimab-pvvr (RUXIENCE) 700 mg in sodium chloride 0.9 % 250 mL (2.1875 mg/mL) infusion, 375 mg/m2 = 700 mg (original dose ), Intravenous,  Once, 0 of 1 cycle Dose modification: 375 mg/m2 (Cycle 12)  for chemotherapy treatment.     PHYSICAL EXAMINATION:  Vitals:   07/14/20 0617 07/14/20 0906  BP: (!) 130/58   Pulse: 64   Resp: 19   Temp: 98.3 F (36.8 C)   SpO2: 95% Marland Kitchen)  85%   Filed Weights   07/10/20 1400 07/11/20 0449 07/12/20 0500  Weight: 76.1 kg 70.8 kg 71.1 kg    Intake/Output from previous day: 07/25 0701 - 07/26 0700 In: 78 [P.O.:360; IV Piggyback:250] Out: 1200 [Urine:1200]  GENERAL: Appears very weak, alert HEENT: No thrush, mouth is dry LYMPH: Soft mobile 1-2 cm left cervical and left axillary  nodes LUNGS: Increased respiratory rate, diminished breath sounds on the right HEART: Regular rate and rhythm ABDOMEN: Nontender, spleen tip palpable in the left subcostal region SKIN: Multiple ecchymoses noted on her bilateral arms, legs, and trunk NEURO: Alert and oriented x3 Vascular: No leg edema  LABORATORY DATA:  I have reviewed the data as listed CMP Latest Ref Rng & Units 07/14/2020 07/13/2020 07/12/2020  Glucose 70 - 99 mg/dL 107(H) 106(H) 108(H)  BUN 8 - 23 mg/dL '14 16 16  ' Creatinine 0.44 - 1.00 mg/dL 0.68 0.69 0.73  Sodium 135 - 145 mmol/L 136 140 142  Potassium 3.5 - 5.1 mmol/L 2.8(L) 2.9(L) 3.0(L)  Chloride 98 - 111 mmol/L 84(L) 87(L) 92(L)  CO2 22 - 32 mmol/L 40(H) 40(H) 41(H)  Calcium 8.9 - 10.3 mg/dL 7.0(L) 7.0(L) 7.3(L)  Total Protein 6.5 - 8.1 g/dL - - -  Total Bilirubin 0.3 - 1.2 mg/dL - - -  Alkaline Phos 38 - 126 U/L - - -  AST 15 - 41 U/L - - -  ALT 0 - 44 U/L - - -    Lab Results  Component Value Date   WBC 4.8 07/14/2020   HGB 10.4 (L) 07/14/2020   HCT 32.6 (L) 07/14/2020   MCV 106.5 (H) 07/14/2020   PLT 128 (L) 07/14/2020   NEUTROABS 2.8 06/23/2020    DG Chest 1 View  Result Date: 07/08/2020 CLINICAL DATA:  86 year old female status post right thoracentesis. EXAM: CHEST  1 VIEW COMPARISON:  Chest radiograph dated 07/06/2020. FINDINGS: Right-sided Port-A-Cath in similar position. Bilateral patchy airspace opacities, right greater left with interval progression compared to prior radiograph may represent edema or pneumonia. Clinical correlation is recommended. Small bilateral pleural effusions. No pneumothorax. Stable cardiac silhouette. Atherosclerotic calcification of the aorta. No acute osseous pathology. IMPRESSION: 1. Small bilateral pleural effusions.  No pneumothorax. 2. Bilateral airspace opacities, progressed since the prior radiograph. Electronically Signed   By: Anner Crete M.D.   On: 07/08/2020 18:26   DG Chest 1 View  Result Date:  07/04/2020 CLINICAL DATA:  Status post right thoracentesis. EXAM: CHEST  1 VIEW COMPARISON:  Chest x-ray from yesterday. FINDINGS: Unchanged right chest wall port catheter. The heart size and mediastinal contours are within normal limits. Normal pulmonary vascularity. Trace residual right pleural effusion status post thoracentesis. Improved aeration at the right lung base. No pneumothorax. Unchanged small left pleural effusion and basilar atelectasis. No acute osseous abnormality. IMPRESSION: 1. Trace residual right pleural effusion status post thoracentesis. No pneumothorax. 2. Unchanged small left pleural effusion and basilar atelectasis. Electronically Signed   By: Titus Dubin M.D.   On: 07/04/2020 11:42   DG Chest 2 View  Result Date: 07/09/2020 CLINICAL DATA:  Follow-up pleural effusion. EXAM: CHEST - 2 VIEW COMPARISON:  07/08/20 FINDINGS: Right chest wall port a catheter is noted with tip projecting over the cavoatrial junction. Interval increase in volume of right pleural effusion. Mild diffuse pulmonary edema. Bilateral airspace opacities are noted within the left upper lobe, right midlung and right base concerning for multifocal pneumonia. IMPRESSION: 1. Increase in volume of right pleural effusion with mild interstitial edema  2. Bilateral airspace opacities concerning for multifocal pneumonia. Electronically Signed   By: Kerby Moors M.D.   On: 07/09/2020 11:21   DG Chest 2 View  Result Date: 07/06/2020 CLINICAL DATA:  Worsening shortness of breath. EXAM: CHEST - 2 VIEW COMPARISON:  07/04/2020 FINDINGS: The cardiac silhouette remains grossly normal in size, spur partially obscured by significantly increased patchy opacity in the right lower lung zone. Small amount of posterior pleural fluid. Mild linear density at the left lung base. Right subclavian porta catheter tip in the region of the superior cavoatrial junction. Unremarkable bones. IMPRESSION: 1. Significantly increased right lower lung  zone pneumonia. 2. Small amount of posterior pleural fluid. 3. Mild left basilar atelectasis. Electronically Signed   By: Claudie Revering M.D.   On: 07/06/2020 16:24   DG Chest 2 View  Result Date: 07/03/2020 CLINICAL DATA:  Dyspnea, pleural effusions EXAM: CHEST - 2 VIEW COMPARISON:  06/26/2020 FINDINGS: Frontal and lateral views of the chest demonstrates stable right chest wall port. Cardiac silhouette is unremarkable. There is now a moderate right pleural effusion with right basilar consolidation. Decreased left pleural effusion, with only trace fluid at the left costophrenic angle. Persistent retrocardiac consolidation. No pneumothorax. No acute bony abnormalities. IMPRESSION: 1. Decreased left pleural effusion, with persistent left basilar consolidation. 2. Increasing right pleural effusion, with increasing right basilar consolidation. Electronically Signed   By: Randa Ngo M.D.   On: 07/03/2020 16:11   DG Chest 2 View  Result Date: 06/26/2020 CLINICAL DATA:  Dyspnea. EXAM: CHEST - 2 VIEW COMPARISON:  Chest CT 05/22/2020 FINDINGS: Right chest port remains in place. Hazy opacity at the left lung base consistent with pleural effusion and airspace disease/atelectasis. Right lung is clear. Upper normal heart size. Mild right hilar prominence likely secondary to combination of vascular structures and adenopathy is seen on recent CT. Fullness in the upper mediastinum likely secondary to adenopathy. No pneumothorax. No pulmonary edema. Surgical clips in the left axilla. IMPRESSION: 1. Hazy opacity at the left lung base consistent with pleural effusion and airspace disease/atelectasis. 2. Mild right hilar prominence likely secondary to combination of vascular structures and adenopathy seen on recent CT. Electronically Signed   By: Keith Rake M.D.   On: 06/26/2020 15:54   CT ANGIO CHEST PE W OR WO CONTRAST  Result Date: 07/07/2020 CLINICAL DATA:  Lymphoma, recurrent right pleural effusion bloody tap  EXAM: CT ANGIOGRAPHY CHEST WITH CONTRAST TECHNIQUE: Multidetector CT imaging of the chest was performed using the standard protocol during bolus administration of intravenous contrast. Multiplanar CT image reconstructions and MIPs were obtained to evaluate the vascular anatomy. CONTRAST:  147m OMNIPAQUE IOHEXOL 350 MG/ML SOLN COMPARISON:  None. FINDINGS: Cardiovascular: Aortic atherosclerosis. Normal heart size. Central pulmonary arteries are patent. Mediastinum/Nodes: Adenopathy is identified in the supraclavicular region, bilateral axilla, and mediastinum. Nodes appear and measures slightly smaller in size. Lungs/Pleura: Moderate right and mild to moderate left pleural effusions measuring simple fluid in density. Imaging performed during expiration. Persistent atelectasis at the left lung base. New atelectasis/consolidation at the right lung base. Interlobular septal thickening the non dependent right lung may reflect asymmetric edema. Upper Abdomen: Ascites. Unchanged ill-defined hypoattenuating lesion in the superior spleen. Musculoskeletal: No new abnormality. Review of the MIP images confirms the above findings. IMPRESSION: Moderate right and mild to moderate left pleural effusions. Right basilar atelectasis/consolidation and left basilar atelectasis. Interlobular septal thickening in the right lung may reflect asymmetric edema. Multifocal adenopathy appears stable to slightly improved. Electronically Signed   By:  Macy Mis M.D.   On: 07/07/2020 14:33   CT Abdomen Pelvis W Contrast  Result Date: 06/23/2020 CLINICAL DATA:  86 year old female with abdominal pain, nausea and vomiting. History of lymphoma. EXAM: CT ABDOMEN AND PELVIS WITH CONTRAST TECHNIQUE: Multidetector CT imaging of the abdomen and pelvis was performed using the standard protocol following bolus administration of intravenous contrast. CONTRAST:  17m OMNIPAQUE IOHEXOL 300 MG/ML  SOLN COMPARISON:  CT of the chest abdomen pelvis dated  05/22/2020. FINDINGS: Lower chest: Partially visualized moderate left and trace right pleural effusions, new since the prior CT. There is associated partial compressive atelectasis of the left lower lobe. Pneumonia is not excluded. Clinical correlation is recommended. No intra-abdominal free air. Interval development of a small ascites, new or significantly increased since the prior CT. Hepatobiliary: A 1 cm hypodense focus along the posterior liver capsule is not characterized but may represent a cyst or focal area of scarring. This is similar to prior CT. Subcentimeter hypodense focus in the anterior liver is too small to characterize. There is mild intrahepatic biliary ductal dilatation, likely post cholecystectomy. The common bile duct is dilated measuring 14 mm. No retained calcified stone noted in the central CBD. Pancreas: The pancreas is unremarkable as visualized. Spleen: Mildly enlarged spleen measuring 15 cm in craniocaudal length. Adrenals/Urinary Tract: The adrenal glands are poorly visualized but grossly unremarkable. There is mild fullness of the renal collecting systems bilaterally without frank hydronephrosis. Mild hazy appearance of the urothelium noted. Correlation with urinalysis recommended to exclude UTI. There is symmetric enhancement and excretion of contrast by both kidneys. Subcentimeter left renal hypodense focus is not characterized. The urinary bladder is partially distended. Probable chronic bladder wall thickening and perivesical stranding. Correlation with urinalysis recommended to exclude UTI. Stomach/Bowel: There is sigmoid diverticulosis without definite active inflammatory changes. There is postsurgical changes of bowel with anastomotic suture in the right lower quadrant. No definite evidence of bowel obstruction. Evaluation however is limited in the absence of oral contrast. Vascular/Lymphatic: There is moderate aortoiliac atherosclerotic disease. The IVC is unremarkable as  visualized. The SMV, splenic vein, and main portal vein are patent. No portal venous gas. Bulky retroperitoneal adenopathy encasing the abdominal aorta and IVC as well as enlarged bilateral iliac chain lymph nodes in keeping with history of lymphoma. Overall the degree of adenopathy is relatively similar to prior CT. Reproductive: The uterus is poorly visualized. Other: Diffuse mesenteric edema, new since the prior CT. There is scattered mesenteric adenopathy as seen previously. Musculoskeletal: Degenerative changes of the spine. Minimal compression fracture of the anterior superior and inferior endplates of the L3, new since the prior CT, likely acute or subacute. IMPRESSION: 1. Interval development of partially visualized moderate left and trace right pleural effusions, as well as development of diffuse mesenteric edema and ascites, new since the prior CT. 2. Bulky retroperitoneal and iliac chain adenopathy in keeping with history of lymphoma. Overall the degree of adenopathy is relatively similar to prior CT. 3. Mild splenomegaly. 4. Minimal compression fracture of the superior and inferior endplate of L3, likely acute or subacute. 5. Sigmoid diverticulosis. 6. Aortic Atherosclerosis (ICD10-I70.0). Electronically Signed   By: AAnner CreteM.D.   On: 06/23/2020 16:39   ECHOCARDIOGRAM COMPLETE  Result Date: 07/07/2020    ECHOCARDIOGRAM REPORT   Patient Name:   Tracy DOWNUMDate of Exam: 07/07/2020 Medical Rec #:  [id number]           Height:       68.0 in Accession #:  [id number]           Weight:       174.0 lb Date of Birth:  09/03/1934           BSA:          1.926 m Patient Age:    54 years             BP:           132/65 mmHg Patient Gender: F                    HR:           855 bpm. Exam Location:  Inpatient Procedure: 2D Echo Indications:    Atrial tachycardia [facility]) [[id number]]  History:        Patient has prior history of Echocardiogram examinations, most                 recent 05/31/2019.  Risk Factors:Hypertension. Lymphoma , thyroid                 disease, thyroid cancer. Past history of afib.  Sonographer:    Darlina Sicilian RDCS Referring Phys: [place]  1. Left ventricular ejection fraction, by estimation, is 60 to 65%. The left ventricle has normal function. The left ventricle has no regional wall motion abnormalities. Left ventricular diastolic parameters were normal.  2. Right ventricular systolic function is normal. The right ventricular size is normal.  3. Moderate pleural effusion in both left and right lateral regions.  4. The mitral valve is normal in structure. Trivial mitral valve regurgitation. No evidence of mitral stenosis.  5. The aortic valve is tricuspid. Aortic valve regurgitation is not visualized. No aortic stenosis is present.  6. The inferior vena cava is normal in size with <50% respiratory variability, suggesting right atrial pressure of 8 mmHg. Conclusion(s)/Recommendation(s): Normal biventricular function without evidence of hemodynamically significant valvular heart disease. FINDINGS  Left Ventricle: Left ventricular ejection fraction, by estimation, is 60 to 65%. The left ventricle has normal function. The left ventricle has no regional wall motion abnormalities. The left ventricular internal cavity size was normal in size. There is  no left ventricular hypertrophy. Left ventricular diastolic parameters were normal. Right Ventricle: The right ventricular size is normal. No increase in right ventricular wall thickness. Right ventricular systolic function is normal. Left Atrium: Left atrial size was normal in size. Right Atrium: Right atrial size was normal in size. Pericardium: Trivial pericardial effusion is present. Mitral Valve: The mitral valve is normal in structure. Trivial mitral valve regurgitation. No evidence of mitral valve stenosis. Tricuspid Valve: The tricuspid valve is normal in structure. Tricuspid valve regurgitation is mild . No  evidence of tricuspid stenosis. Aortic Valve: The aortic valve is tricuspid. Aortic valve regurgitation is not visualized. No aortic stenosis is present. Pulmonic Valve: The pulmonic valve was grossly normal. Pulmonic valve regurgitation is trivial. Aorta: The aortic root, ascending aorta and aortic arch are all structurally normal, with no evidence of dilitation or obstruction. Venous: The inferior vena cava is normal in size with less than 50% respiratory variability, suggesting right atrial pressure of 8 mmHg. IAS/Shunts: No atrial level shunt detected by color flow Doppler. Additional Comments: There is a moderate pleural effusion in both left and right lateral regions.  LEFT VENTRICLE PLAX 2D LVIDd:         5.10 cm  Diastology LVIDs:         3.30 cm  LV e' lateral:   8.27 cm/s LV PW:         0.90 cm  LV E/e' lateral: 9.2 LV IVS:        0.70 cm  LV e' medial:    6.64 cm/s LVOT diam:     1.80 cm  LV E/e' medial:  11.5 LV SV:         30 LV SV Index:   15 LVOT Area:     2.54 cm  RIGHT VENTRICLE RV S prime:     14.80 cm/s TAPSE (M-mode): 2.4 cm LEFT ATRIUM           Index       RIGHT ATRIUM           Index LA diam:      3.00 cm 1.56 cm/m  RA Area:     13.80 cm LA Vol (A2C): 44.7 ml 23.21 ml/m RA Volume:   33.50 ml  17.39 ml/m LA Vol (A4C): 42.6 ml 22.12 ml/m  AORTIC VALVE LVOT Vmax:   77.70 cm/s LVOT Vmean:  49.700 cm/s LVOT VTI:    0.116 m  AORTA Ao Root diam: 3.00 cm MITRAL VALVE MV Area (PHT): 4.96 cm    SHUNTS MV Decel Time: 153 msec    Systemic VTI:  0.12 m MV E velocity: 76.30 cm/s  Systemic Diam: 1.80 cm MV A velocity: 78.80 cm/s MV E/A ratio:  0.97 Buford Dresser MD Electronically signed by Buford Dresser MD Signature Date/Time: 07/07/2020/5:02:22 PM    Final    [place] ASCITES (ABDOMEN LIMITED)  Result Date: 07/01/2020 CLINICAL DATA:  History of lymphoma, now with concern for symptomatic intra-abdominal ascites. Please perform ascites search ultrasound and ultrasound-guided paracentesis  as indicated. EXAM: LIMITED ABDOMEN ULTRASOUND FOR ASCITES TECHNIQUE: Limited ultrasound survey for ascites was performed in all four abdominal quadrants. COMPARISON:  CT abdomen pelvis-06/23/2020 FINDINGS: Sonographic evaluation demonstrates a trace amount of intra-abdominal ascites, too small to allow for safe ultrasound-guided paracentesis. Note is made of small bilateral pleural effusions, the right side of which appears new compared to abdominal CT performed 06/23/2020. IMPRESSION: 1. Trace amount of intra-abdominal ascites, too small to allow for safe ultrasound-guided paracentesis. No paracentesis attempted. 2. Small bilateral pleural effusions - the left-sided pleural effusion appears similar to abdominal CT performed 06/23/2020 however the right-sided pleural effusion appears new of the CT. Electronically Signed   By: Sandi Mariscal M.D.   On: 07/01/2020 15:11   US Abdomen Limited RUQ  Result Date: 07/02/2020 CLINICAL DATA:  Abnormal LFTs EXAM: ULTRASOUND ABDOMEN LIMITED RIGHT UPPER QUADRANT COMPARISON:  CT 06/23/2020.  Ultrasound 07/01/2020 FINDINGS: Gallbladder: Prior cholecystectomy Common bile duct: Diameter: Prominent measuring up to 10 mm, likely related to post cholecystectomy state. Liver: Heterogeneous, slightly increased echotexture. Subtle nodular contours to the liver surface. Findings raise the possibility of cirrhosis. No suspicious focal hepatic abnormality. Portal vein is patent on color Doppler imaging with normal direction of blood flow towards the liver. Other: Ascites and right effusion noted. Prominent porta hepatis lymph nodes. IMPRESSION: Heterogeneous, increased echotexture throughout the liver. Subtle nodular contour seen in some areas of the liver. Findings suggest the possibility of cirrhosis. Perihepatic ascites and right pleural effusion. Mild biliary ductal dilatation, likely related to post cholecystectomy state. Prominent porta hepatis lymph nodes, likely related to liver  disease. Electronically Signed   By: Rolm Baptise M.D.   On: 07/02/2020 08:52   US THORACENTESIS ASP PLEURAL SPACE W/IMG GUIDE  Result Date: 07/08/2020 INDICATION: Lymphoma, remote  colon and thyroid cancers, dyspnea, right pleural effusion.request for diagnostic and therapeutic thoracentesis. EXAM: ULTRASOUND GUIDED RIGHT THORACENTESIS MEDICATIONS: 1% lidocaine 10 mL COMPLICATIONS: None immediate. PROCEDURE: An ultrasound guided thoracentesis was thoroughly discussed with the patient and questions answered. The benefits, risks, alternatives and complications were also discussed. The patient understands and wishes to proceed with the procedure. Written consent was obtained. Ultrasound was performed to localize and mark an adequate pocket of fluid in the right chest. The area was then prepped and draped in the normal sterile fashion. 1% Lidocaine was used for local anesthesia. Under ultrasound guidance a 6 Fr Safe-T-Centesis catheter was introduced. Thoracentesis was performed. The catheter was removed and a dressing applied. FINDINGS: A total of approximately 500 mL of red fluid was removed. Samples were sent to the laboratory as requested by the clinical team. IMPRESSION: Successful ultrasound guided right thoracentesis yielding 500 mL of pleural fluid. Read by: Gareth Eagle, PA-C Electronically Signed   By: Jerilynn Mages.  Shick M.D.   On: 07/08/2020 16:31   US THORACENTESIS ASP PLEURAL SPACE W/IMG GUIDE  Result Date: 07/04/2020 INDICATION: Patient with history of lymphoma, remote colon and thyroid cancers, dyspnea, right pleural effusion. Request made for diagnostic and therapeutic right thoracentesis. EXAM: ULTRASOUND GUIDED DIAGNOSTIC AND THERAPEUTIC RIGHT THORACENTESIS MEDICATIONS: 1% lidocaine to skin and subcutaneous tissue COMPLICATIONS: None immediate. PROCEDURE: An ultrasound guided thoracentesis was thoroughly discussed with the patient and questions answered. The benefits, risks, alternatives and  complications were also discussed. The patient understands and wishes to proceed with the procedure. Written consent was obtained. Ultrasound was performed to localize and mark an adequate pocket of fluid in the right chest. The area was then prepped and draped in the normal sterile fashion. 1% Lidocaine was used for local anesthesia. Under ultrasound guidance a 6 Fr Safe-T-Centesis catheter was introduced. Thoracentesis was performed. The catheter was removed and a dressing applied. FINDINGS: A total of approximately 1.6 liters of blood-tinged fluid was removed. Samples were sent to the laboratory as requested by the clinical team. IMPRESSION: Successful ultrasound guided diagnostic and therapeutic right thoracentesis yielding 1.6 liters of pleural fluid. Read by: Rowe Robert, PA-C Electronically Signed   By: Lucrezia Europe M.D.   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 Status post weekly Rituxan x4 03/01/2012 through 03/22/2012. She completed 4 "maintenance" doses of Rituxan, last on 12/19/2012. A restaging CT on 02/09/2013 showed no evidence of lymphoma.   Lymph node lateral to the thyroid bed on a neck ultrasound 02/21/2014, status post an FNA biopsy concerning for a lymphoproliferative disorder.  PET scan 09/28/2016 with active lymphoma within the neck, chest, abdomen, pelvis; splenic enlargement and hypermetabolism suspicious for splenic involvement.  Initiation of Rituxan weekly 4 09/29/2016  Initiation of maintenance Rituxan on a 3 month schedule 12/23/2016; final Rituxan 08/31/2018  Thyroid ultrasound 02/07/2019-left cervical lymphadenopathy  PET scan 03/08/2019-extensive recurrent hypermetabolic lymphoma involving the neck, chest, abdomen and pelvis.  03/16/2019 left cervical lymph node biopsy-features consistent with previously  diagnosed non-Hodgkin's B-cell lymphoma, phenotypically consistent with marginal zone lymphoma. Flow cytometry with lambda restricted B-cell population without expression of CD5 or CD10 comprising 87% of all lymphocytes.  Cycle 1 bendamustine/Rituxan 03/22/2019  Excision deep left axillary lymph nodes 05/04/2019-non-Hodgkin's B-cell lymphoma with differential including a marginal zone lymphoma and atypical small lymphocytic lymphoma. Flow cytometry with monoclonal B-cell population without expression of CD5 or CD10, comprises 96% of  all lymphocytes.  Cycle 1 CHOP/Rituxan 05/18/2019  Cycle 2 [facility]OP/Rituxan 06/08/2019  CTs 06/15/2019-partial improvement in diffuse adenopathy, stable mild splenomegaly  Cycle 1 Revlimid/rituximab 07/19/2019 (Revlimid start 07/20/2019)  Cycle 2 Revlimid/rituximab 08/16/2019 (Revlimid placed on hold 08/30/2019 due to neutropenia)  Cycle 3 of Revlimid/rituximab 09/13/2019 (Revlimid schedule changed to 14 days on/14 days off)  Cycle 4 Revlimid/rituximab 10/10/2019  Cycle 5 Revlimid/rituximab 11/07/2019  Cycle 6 Revlimid/rituximab 12/07/2019  CTs 12/28/2019-diffuse lymphadenopathy-slightly increased compared to 06/15/2019  Cycle 7 Revlimid/rituximab 01/04/2020  Cycle 8 Revlimid/Rituxan 02/01/2020  Cycle 9 Revlimid/Rituxan 03/05/2020  Cycle 10 Revlimid/Rituxan 04/02/2020  Cycle 11 Revlimid/Rituxan 04/30/2020  CT 05/22/2020-mild increase in left supraclavicular adenopathy, mild increase in chest, retroperitoneal, and pelvic adenopathy. Stable mild splenomegaly.  Acalabrutinib started 06/19/2020  CT abdomen/pelvis 06/23/2020-moderate left and trace right pleural effusions, new diffuse mesenteric edema and ascites, stable retroperitoneal and iliac adenopathy, mild splenomegaly 2. Stage IV (T1bN1b M0) papillary thyroid cancer, status post a thyroidectomy with reimplantation of the left superior parathyroid gland on 05/23/2012, status post radioactive iodine therapy, followed  by Dr. Buddy Duty.  3. Stage II (T3 N0) colon cancer, status post a right colectomy 10/19/2011, last colonoscopy April 2015-sigmoid adenoma removed.  4. History of a pulmonary embolism December 2012.  5. History of Atrial fibrillation 6. Iron deficiency anemia-new 03/18/2014. Hemoccult positive stool. The anemia corrected with iron. No longer taking iron.  Status post an upper endoscopy and colonoscopy by Dr. Carlean Purl April 2015 with no bleeding source identified, benign adenoma removed from the sigmoid colon. 7. Report of an upper gastric intestinal bleed fall 2016-managed in [place]. [name] 8. Left knee replacement May 2017, repeat left knee surgery May 2018 9. Pruritic rash 07/22/2016 10. Nausea and diarrhea 04/02/2019-stool negative for C. difficile toxin 11. 06/18/2019 [facility] admission for symptomatic anemia. 12.  Admission 06/23/2020 with nausea and diarrhea 13.  Respiratory failure, bilateral pleural effusions  Right thoracentesis 07/04/2020-culture and cytology negative  CT chest 07/07/2020-right and left pleural effusions, bilateral basilar atelectasis, interlobular septal thickening in the right lung-asymmetric edema?  Improved adenopathy  Right thoracentesis 07/08/2020  Tracy Mclaughlin continues to have dyspnea and generalized weakness.  Etiology of dyspnea is not clear.  Unable to wean O2 this morning.  Pleural fluid from repeat thoracentesis showed reactive mesothelial cells.  Labs from this morning show hypokalemia.  Recommendations: 1.  Continue to hold acalabrutinib 2.  Consider stopping the Lasix, unclear she is benefiting from diuresis 3.  Attempt to wean O2 as tolerated. 4.  Chest x-ray today 5.  Recommend PT consult to evaluate ability to return home versus SNF. 6.  Repeat right thoracentesis with fluid for culture, cytology, and flow cytometry if there is a significant right effusion on the chest x-ray today 7.  Stop Benadryl and decrease the Xanax dose   LOS: 20 days   Mikey Bussing 07/14/20  Tracy Mclaughlin was interviewed and examined.  She is lethargic this morning.  This may be related to Xanax and Benadryl she was given last night, or the respiratory failure.  She continues to require a high level of oxygen support.  We will check a chest x-ray today.  I updated her husband by telephone. We will request a repeat thoracentesis with fluid to be sent for culture, flow cytometry, and cytology if the right pleural fluid has reaccumulated.

## 2020-07-14 NOTE — Progress Notes (Signed)
PROGRESS NOTE    Tracy Mclaughlin  GBT:[id number] DOB: 07-08-1934 DOA: 06/23/2020 PCP: [name], PA    Brief Narrative:  This patient is 86 y.o.femalewith medical history significant forlow-grade lymphoma without significant response to chemotherapy last year and was recently started onBTK INHIBITORby Dr. [name], hypothyroidism, history of A. fib, pancytopenia presents to the ED with3-4 day history of nausea vomiting and diarrhea and abdominal pain-and right upper quadrant pain.symptoms started after new chemo on Thursday.Patient denies any fever,  chest pain.vomitted x3 today and had non bloody diarrhea 4 times today.  ED Course:Hemodynamically stable, afebrile, UA positive for UTI, CT scan abdomen done that showed free fluid with ascites and mesenteric edema, case was discussed Dr. [name] from oncology,  advised UTI treatment with Zosyn .  Patient  was admitted, being managed symptomatically, chemo was held,  diarrhea improved subsequently,  oncology put the patient back on chemo overall tolerating well. Having nausea anorexia suspecting this is related to her lymphoma. Patient has been started on prednisone to help with appetite and also received trazodone for sleep. Patient remains deconditioned.  She was found to get more sleepy and confused subsequently trazodone and prednisone were discontinued with resolution of her confusion. Patient was also noticed to have shortness of breath/dyspnea with activity-imaging studies showed pleural effusion underwent right-sided thoracentesis with 1.6 L of blood-tinged fluid removal, cytology reactive medically stable, no growth on culture. PT had suggested skilled nursing facility,  patient had PICC line, plan:  SNF as well as home health.  Patient went into rapid tachycardia supraventricular 7/19 a.m. and also with hypoxia-underwent CTA chest no PE but right-sided pleural effusion atelectasis/consolidation-cardiologist consulted.   Patient converted to sinus rhythm spontaneously. received few days of IV antibiotics , Procalcitonin has been negative/normal- no fever no leukocytosis. Patient continues to remain hypoxic and in respiratory failure, scheduled to have thoracocentesis.   Assessment & Plan:   Principal Problem:   Recurrent pleural effusion on right Active Problems:   Non Hodgkin's lymphoma (HCC)   Hypertension   Anemia, iron deficiency   PAF (paroxysmal atrial fibrillation) (HCC)   Anemia   Pancytopenia (HCC)   Nausea & vomiting   UTI (urinary tract infection)   Ascites   Acute respiratory failure with hypoxia and hypercapnia (HCC)  Acute hypoxic respiratory failure due to pleural effusion / No PE on the CT. -Stable, still requiring 4 L of oxygen, to maintain O2 sat greater than 92% -Currently satting 97% -Pleural cultures are growing gram-positive cocci - on  broad-spectrum antibiotics  Vanco and Zosyn  -we will modify and narrow down antibiotics according to final cultures and sensitivity.  -Remains in respiratory distress O2 dependent, hypoxic,  worsening shortness of breath with minimal exertion - Recurrent pleural effusion -07/08/2020 US guidedrightthoracentesis. Yielded500 mLof redfluid. CXR: 1. Increase in volume of right pleural effusion with mild interstitial edema 2. Bilateral airspace opacities concerning for multifocal pneumonia.  -Continue gentle diuretics -As needed duo nebs  -We will continue empiric antibiotics -Low suspicion for pneumonia, sepsis. -Patient needs repeat thoracocentesis for cytology  Intractable nausea vomitingdiarrhea andabdominal pain: -Resolved -Unclear etiology suspecting due to [place] held on admission and resumed 7/8. Diarrhea improved.   Acalabrutinib  was again discontinued as her LFTs were worsening. Has new onset ascites on the CT scan-but too small to tap on the ultrasound . Cont supportive measure.  - Prednisone was  added to stimulate appetite but discontinued due to confusion  Recurrent pleural effusion: -Likely from  Lymphoma  Vs infection or 2/2 Acalabrutinib ( less likely per oncology) . S/p rt thoracentesis 1.6 L blood-tinged fluid was removed.  Pleural fluid Gram stain no organism, culture no growth so far, cytology reactive mesothelial cell.   CTA shows moderate right and mild to moderate left pleural effusions-BNP around 400  -Echo shows preserved EJF 60 to 65%.  no diastolic dysfunction, unclear if related to CHF versus low Albumin.   Cardiology on board will start trial of IV Lasix low-dose, I's and O's: +10.1 L  - ordered US guided thoracentesis along with labs/cytology, discussed with cardio and  Oncology.  Narrow complex tachycardia 07/07/20 am- pt moved to tele.    Converted to sinus rhythm.  No recurrent episode -remained stable- Cardiology following closely -Continue diltiazem CD 180 nightly, Lopressor 12.5 mg p.o. twice daily, -Following thyroid function -2D echocardiogram reviewed.  Ascites: -new onset ascites on the CT scan-too small to tap on paracentesis per IR.  Abnormal LFTS: -Improving 2/2 Acalabrutinib-which is discontinued.  LFTs improving.RUQ [place]- possible cirrhotic changes, hepatitis panel negative.   Acute metabolic encephalopathy from polypharmacy: -Mentation back to baseline  had  confusion/Lethargy: 7/14 a.m likley polypharmacy and pt feels improved after stopping trazodone/prednisone.   -Continue to complain of insomnia, sleeping medication as needed  She is on Xanax low-dose continue for anxiety.  Continue supportive care.   E coli UTI VFI:EPPIRJJOA 7 days of antibiotics.   Non Hodgkin's lymphoma -Remained stable, -Oncology following -with progressive decline, patientwasstartedpast weekonAcalabrutinib she did not have significant response to chemotherapy last year.Acalabrutinibwas held on admission and resumed  But again stopped due to worsening LFTs by  oncology.   Failure to thrive/deconditioning: Remains weak and deconditioned.  -PT continues to recommend SNF -She refused SNF.  She is high risk for readmission.  Husband concerned about poor support.  Hypertension: BP is controlled.  Continue Cardizem.    Pancytopenia with anemia/Thrombocytopenia in the setting of lymphoma/chemo: -Hemoglobin and WBC count overall stable.  Monitor. Heparin was discontinued by hematology due to bilateral lower leg bruise and thrombocytopenia.    History of intermittent atrial fibrillation on Cardizem: - Not on anticoagulation due to history of subarachnoid hemorrhage as per cardiology. Bruise on the skin heparin discontinued per oncology. improving.   DVT prophylaxis: Place and maintain sequential compression device Start: 06/30/20 0901 Heparin stopped due to  extensive LE bruise 7/12 and low platelet counts by hematology. Code Status: DNR Family Communication: Discussed with patient today.  Yesterday had extensive discussion with the patient's husband.  Status is: Inpatient Remains inpatient appropriate because: Patient remains deconditioned weak, hypoxic.  Dispo: The patient is from: Home   Anticipated d/c is to: SNF as per PT- pt declines SNF and HH.  Continued to engage on placement recomendations.  Anticipated d/c date is: 2 days.  Once shortness is better, and cleared by her oncologist.discussed with her oncology   Patient currently not medically stable.   Consultants:   Hematology, Pulmonology  Procedures: Right thoracocentesis  Antimicrobials: Anti-infectives (From admission, onward)   Start     Dose/Rate Route Frequency Ordered Stop   07/10/20 1600  vancomycin (VANCOREADY) IVPB 750 mg/150 mL     Discontinue     750 mg 150 mL/hr over 60 Minutes Intravenous Every 12 hours 07/10/20 1332     07/10/20 1430  piperacillin-tazobactam (ZOSYN) IVPB 3.375 g     Discontinue     3.375 g 12.5 mL/hr over 240  Minutes Intravenous Every 8 hours 07/10/20 1332     07/06/20  1800  cefTRIAXone (ROCEPHIN) 1 g in sodium chloride 0.9 % 100 mL IVPB  Status:  Discontinued        1 g 200 mL/hr over 30 Minutes Intravenous Every 24 hours 07/06/20 1646 07/08/20 1037   07/06/20 1800  azithromycin (ZITHROMAX) tablet 500 mg  Status:  Discontinued        500 mg Oral Daily 07/06/20 1646 07/08/20 1037   06/27/20 0830  cefTRIAXone (ROCEPHIN) 1 g in sodium chloride 0.9 % 100 mL IVPB  Status:  Discontinued        1 g 200 mL/hr over 30 Minutes Intravenous Every 24 hours 06/27/20 0733 06/30/20 1340   06/24/20 0200  piperacillin-tazobactam (ZOSYN) IVPB 3.375 g  Status:  Discontinued        3.375 g 12.5 mL/hr over 240 Minutes Intravenous Every 8 hours 06/23/20 1703 06/27/20 0733   06/23/20 1645  piperacillin-tazobactam (ZOSYN) IVPB 3.375 g        3.375 g 100 mL/hr over 30 Minutes Intravenous  Once 06/23/20 1638 06/23/20 1834   06/23/20 1615  cefTRIAXone (ROCEPHIN) 1 g in sodium chloride 0.9 % 100 mL IVPB  Status:  Discontinued        1 g 200 mL/hr over 30 Minutes Intravenous  Once 06/23/20 1607 06/23/20 1608     Subjective: Patient was seen and examined at bedside.  No overnight events.  She appears comfortable but continues to have shortness of breath on exertion.   Objective: Vitals:   07/13/20 2048 07/14/20 0617 07/14/20 0906 07/14/20 1350  BP: (!) 114/43 (!) 130/58  (!) 112/48  Pulse: 75 64  69  Resp: 19 19  20   Temp: 98.7 F (37.1 C) 98.3 F (36.8 C)  97.6 F (36.4 C)  TempSrc: Oral   Oral  SpO2: 95% 95% (!) 85% 98%  Weight:      Height:        Intake/Output Summary (Last 24 hours) at 07/14/2020 1547 Last data filed at 07/14/2020 1534 Gross per 24 hour  Intake 933.5 ml  Output 1200 ml  Net -266.5 ml   Filed Weights   07/10/20 1400 07/11/20 0449 07/12/20 0500  Weight: 76.1 kg 70.8 kg 71.1 kg    Examination:  General exam: Appears calm and comfortable  Respiratory system: Clear to auscultation.  Respiratory effort normal.  Porta catheter on the chest. Cardiovascular system: S1 & S2 heard, RRR. No JVD, murmurs, rubs, gallops or clicks. No pedal edema. Gastrointestinal system: Abdomen is nondistended, soft and nontender. No organomegaly or masses felt. Normal bowel sounds heard. Central nervous system: Alert and oriented. No focal neurological deficits. Extremities:  No pain, Bruising noted on LE Skin: No rashes, lesions or ulcers Psychiatry: Judgement and insight appear normal. Mood & affect appropriate.     Data Reviewed: I have personally reviewed following labs and imaging studies  CBC: Recent Labs  Lab 07/10/20 0322 07/11/20 0321 07/12/20 0400 07/13/20 0442 07/14/20 0416  WBC 3.9* 4.4 4.5 4.6 4.8  HGB 10.8* 10.2* 9.7* 9.9* 10.4*  HCT 33.1* 32.3* 30.7* 31.4* 32.6*  MCV 105.8* 106.6* 106.6* 106.1* 106.5*  PLT 125* 124* 126* 123* 735*   Basic Metabolic Panel: Recent Labs  Lab 07/08/20 0340 07/08/20 0340 07/09/20 0537 07/09/20 0537 07/10/20 0322 07/11/20 0321 07/12/20 0400 07/13/20 0442 07/14/20 0416  NA 143   < > 141   < > 143 143 142 140 136  K 3.8   < > 3.9   < > 3.1* 3.2* 3.0* 2.9* 2.8*  CL 106   < > 101   < > 96* 93* 92* 87* 84*  CO2 30   < > 34*   < > 36* 38* 41* 40* 40*  GLUCOSE 109*   < > 120*   < > 103* 117* 108* 106* 107*  BUN 9   < > 10   < > 10 15 16 16 14   CREATININE 0.50   < > 0.46   < > 0.51 0.67 0.73 0.69 0.68  CALCIUM 7.3*   < > 7.7*   < > 7.6* 7.5* 7.3* 7.0* 7.0*  MG 1.7  --  1.7  --   --   --   --  1.4*  --   PHOS  --   --   --   --   --   --   --  3.8  --    < > = values in this interval not displayed.   GFR: Estimated Creatinine Clearance: 53.7 mL/min (by C-G formula based on SCr of 0.68 mg/dL). Liver Function Tests: Recent Labs  Lab 07/08/20 0340  AST 37  ALT 26  ALKPHOS 156*  BILITOT 0.7  PROT 4.8*  ALBUMIN 2.6*   No results for input(s): LIPASE, AMYLASE in the last 168 hours. No results for input(s): AMMONIA in the last  168 hours. Coagulation Profile: No results for input(s): INR, PROTIME in the last 168 hours. Cardiac Enzymes: No results for input(s): CKTOTAL, CKMB, CKMBINDEX, TROPONINI in the last 168 hours. BNP (last 3 results) No results for input(s): PROBNP in the last 8760 hours. HbA1C: No results for input(s): HGBA1C in the last 72 hours. CBG: Recent Labs  Lab 07/08/20 0754 07/08/20 0824 07/08/20 1205 07/09/20 0753 07/09/20 1632  GLUCAP 108* 106* 115* 103* 100*   Lipid Profile: No results for input(s): CHOL, HDL, LDLCALC, TRIG, CHOLHDL, LDLDIRECT in the last 72 hours. Thyroid Function Tests: No results for input(s): TSH, T4TOTAL, FREET4, T3FREE, THYROIDAB in the last 72 hours. Anemia Panel: No results for input(s): VITAMINB12, FOLATE, FERRITIN, TIBC, IRON, RETICCTPCT in the last 72 hours. Sepsis Labs: Recent Labs  Lab 07/08/20 0340 07/09/20 1559  PROCALCITON <0.10 <0.10    Recent Results (from the past 240 hour(s))  Gram stain     Status: None   Collection Time: 07/08/20  5:03 PM   Specimen: Fluid  Result Value Ref Range Status   Specimen Description FLUID PLEURAL  Final   Special Requests   Final    BOTTLES DRAWN AEROBIC AND ANAEROBIC Blood Culture adequate volume   Gram Stain   Final    RARE WBC PRESENT, PREDOMINANTLY MONONUCLEAR NO ORGANISMS SEEN Performed at [facility], [place] [address] [place], [place] [address]    Report Status 07/08/2020 FINAL  Final  Culture, body fluid-bottle     Status: Abnormal   Collection Time: 07/08/20  5:04 PM   Specimen: Fluid  Result Value Ref Range Status   Specimen Description FLUID PLEURAL  Final   Special Requests   Final    BOTTLES DRAWN AEROBIC AND ANAEROBIC Blood Culture adequate volume   Gram Stain   Final    GRAM POSITIVE COCCI IN CLUSTERS AEROBIC BOTTLE ONLY CRITICAL RESULT CALLED TO, READ BACK BY AND VERIFIED WITH: RN RUTH M. [id number] FCP Performed at [facility], [address], [place],   [id number]    Culture STAPHYLOCOCCUS EPIDERMIDIS (A)  Final   Report Status 07/12/2020 FINAL  Final   Organism ID,  Bacteria STAPHYLOCOCCUS EPIDERMIDIS  Final      Susceptibility   Staphylococcus epidermidis - MIC*    CIPROFLOXACIN 2 INTERMEDIATE Intermediate     ERYTHROMYCIN >=8 RESISTANT Resistant     GENTAMICIN 2 SENSITIVE Sensitive     OXACILLIN >=4 RESISTANT Resistant     TETRACYCLINE >=16 RESISTANT Resistant     VANCOMYCIN 2 SENSITIVE Sensitive     TRIMETH/SULFA 20 SENSITIVE Sensitive     CLINDAMYCIN <=0.25 SENSITIVE Sensitive     RIFAMPIN <=0.5 SENSITIVE Sensitive     Inducible Clindamycin NEGATIVE Sensitive     * STAPHYLOCOCCUS EPIDERMIDIS     Radiology Studies: DG Chest 2 View  Result Date: 07/14/2020 CLINICAL DATA:  Dyspnea. EXAM: CHEST - 2 VIEW COMPARISON:  07/09/2020 FINDINGS: Right chest wall port a catheter is noted with tip at the cavoatrial junction. Moderate right pleural effusion the with [place] opacification of the right lower lobe is again noted. When compared with the previous exam there is improved aeration to the right and left midlung zones and left lower lobe. IMPRESSION: 1. Improved aeration to the right and left lower lobes. 2. Persistent right pleural effusion. Electronically Signed   By: Kerby Moors M.D.   On: 07/14/2020 12:16    Scheduled Meds: . Chlorhexidine Gluconate Cloth  6 each Topical Daily  . citalopram  20 mg Oral Daily  . diltiazem  180 mg Oral QHS  . feeding supplement ([place] STANDARD 1.4)  325 mL Oral BID BM  . feeding supplement (PRO-STAT SUGAR FREE 64)  30 mL Oral Daily  . furosemide  20 mg Intravenous BID  . heparin lock flush  500 Units Intracatheter Q30 days  . levothyroxine  224 mcg Oral Q0600  . melatonin  3 mg Oral QHS  . metoprolol tartrate  12.5 mg Oral BID  . multivitamin with minerals  1 tablet Oral Daily  . pantoprazole  40 mg Oral Daily  . potassium chloride  40 mEq Oral BID   Continuous Infusions: . sodium chloride 10  mL/hr at 07/07/20 0945  . piperacillin-tazobactam (ZOSYN)  IV 3.375 g (07/14/20 1541)  . vancomycin 750 mg (07/14/20 0501)     LOS: 20 days    Time spent: 25 mins.    Shawna Clamp, MD [facility]   If 7PM-7AM, please contact night-coverage

## 2020-07-14 NOTE — Progress Notes (Signed)
Name: Tracy Mclaughlin MRN: [id number] DOB: 1934/05/04    ADMISSION DATE:  06/23/2020 CONSULTATION DATE:  07/09/2020  REFERRING MD :  Dr. Barrett Henle  CHIEF COMPLAINT:  Recurrent Pleural Effusion   BRIEF PATIENT DESCRIPTION:  86 yo female, currently being treated for Lymphoma. Now with Bilateral Recurrent Pleural Effusions   HISTORY OF PRESENT ILLNESS:   Admitted 7/5 with Nausea/Vomiting, Diarrhea. Has Lymphoma without significant response to chemotherapy, recently started on BTK Inhibitor. CT A with free fluid, ascites and mesenteric edema. Treated for possible UTI. Stay complicated with bilateral recurrent pleural effusions. Underwent Thoracentesis on 7/16 for Right Side and 7/20 on Left Side. Gram Stain negative, culture with no growth, cytology with reactive mesothelial cells. On 7/19 patient with narrow complex tachycardia, cardiology consulted and added scheduled lopressor in addition to Cardizem. Pulmonary Consult 7/21.   SIGNIFICANT EVENTS  Right Thora 7/16 > 1.6L Out, Bloody  Left Thora 7/20 > 500 ml Out, Bloody  STUDIES:  CXR 7/15 > 1. Decreased left pleural effusion, with persistent left basilar consolidation. 2. Increasing right pleural effusion, with increasing right basilar Consolidation. CTA Chest 7/19 > Moderate right and mild to moderate left pleural effusions. Right basilar atelectasis/consolidation and left basilar atelectasis. Interlobular septal thickening in the right lung may reflect asymmetric edema. Multifocal adenopathy appears stable to slightly improved. CXR 7/21 > 1. Increase in volume of right pleural effusion with mild interstitial edema 2. Bilateral airspace opacities concerning for multifocal pneumonia.   SUBJECTIVE:  Weak, sob  Continues to require 4-5L [place]   VITAL SIGNS: Temp:  [97.6 F (36.4 C)-98.7 F (37.1 C)] 97.6 F (36.4 C) (07/26 1350) Pulse Rate:  [64-75] 69 (07/26 1350) Resp:  [19-20] 20 (07/26 1350) BP: (112-130)/(43-58)  112/48 (07/26 1350) SpO2:  [85 %-98 %] 98 % (07/26 1350)  PHYSICAL EXAMINATION: General:  Chronically ill appearing female, NAD  HEENT: MM pink/moist Neuro: awake and alert  CV: s1s2 rrr, no m/r/g PULM:  resps even non labored on [place], diminished R  GI: soft, bsx4 active  Extremities: warm/dry, no edema  Skin: no rashes or lesions   Recent Labs  Lab 07/12/20 0400 07/13/20 0442 07/14/20 0416  NA 142 140 136  K 3.0* 2.9* 2.8*  CL 92* 87* 84*  CO2 41* 40* 40*  BUN 16 16 14   CREATININE 0.73 0.69 0.68  GLUCOSE 108* 106* 107*   Recent Labs  Lab 07/12/20 0400 07/13/20 0442 07/14/20 0416  HGB 9.7* 9.9* 10.4*  HCT 30.7* 31.4* 32.6*  WBC 4.5 4.6 4.8  PLT 126* 123* 128*   DG Chest 2 View  Result Date: 07/14/2020 CLINICAL DATA:  Dyspnea. EXAM: CHEST - 2 VIEW COMPARISON:  07/09/2020 FINDINGS: Right chest wall port a catheter is noted with tip at the cavoatrial junction. Moderate right pleural effusion the with [place] opacification of the right lower lobe is again noted. When compared with the previous exam there is improved aeration to the right and left midlung zones and left lower lobe. IMPRESSION: 1. Improved aeration to the right and left lower lobes. 2. Persistent right pleural effusion. Electronically Signed   By: Kerby Moors M.D.   On: 07/14/2020 12:16    ASSESSMENT / PLAN:  Acute Hypoxic Respiratory Failure secondary to recurrent bilateral pleural effusions, concern for malignancy vs acute heart failure/volume overload, vs infectious process  -PCT <0.10, Afebrile, WBC 3.7  -[place] ABD Questionable for Cirrhosis   Pleural fluid from 7/21 thoracentesis with reactive mesothelial cells only. Exudative effusion.   Persistent  R effusion on CXR 7/26.   Plan Repeat thora has been ordered  Await repeat cytology - if malignant effusion consider pleur-x  Repeat culture (staph epidermis likely contaminant)  Continuing gentle diuresis although no significant improvement in resp status    Continue to titrate Supplemental Oxygen for Saturation Goal > 90 (Currently on 4L [place])  Mobilize, pulmonary hygiene  Ongoing goals of care discussion   Nickolas Madrid, NP Pulmonary/Critical Care Medicine  07/14/2020  2:21 PM

## 2020-07-15 LAB — CBC
HCT: 32.2 % — ABNORMAL LOW (ref 36.0–46.0)
Hemoglobin: 10.4 g/dL — ABNORMAL LOW (ref 12.0–15.0)
MCH: 33.9 pg (ref 26.0–34.0)
MCHC: 32.3 g/dL (ref 30.0–36.0)
MCV: 104.9 fL — ABNORMAL HIGH (ref 80.0–100.0)
Platelets: 152 10*3/uL (ref 150–400)
RBC: 3.07 MIL/uL — ABNORMAL LOW (ref 3.87–5.11)
RDW: 14.6 % (ref 11.5–15.5)
WBC: 5.7 10*3/uL (ref 4.0–10.5)
nRBC: 0 % (ref 0.0–0.2)

## 2020-07-15 LAB — BASIC METABOLIC PANEL
Anion gap: 11 (ref 5–15)
BUN: 13 mg/dL (ref 8–23)
CO2: 40 mmol/L — ABNORMAL HIGH (ref 22–32)
Calcium: 7.2 mg/dL — ABNORMAL LOW (ref 8.9–10.3)
Chloride: 87 mmol/L — ABNORMAL LOW (ref 98–111)
Creatinine, Ser: 0.75 mg/dL (ref 0.44–1.00)
GFR calc Af Amer: >60 mL/min (ref >60)
GFR calc non Af Amer: >60 mL/min (ref >60)
Glucose, Bld: 102 mg/dL — ABNORMAL HIGH (ref 70–99)
Potassium: 3.1 mmol/L — ABNORMAL LOW (ref 3.5–5.1)
Sodium: 138 mmol/L (ref 135–145)

## 2020-07-15 MED ORDER — KATE FARMS STANDARD 1.4 PO LIQD
325.0000 mL | Freq: Every day | ORAL | Status: DC
  Administered 2020-07-21: 325 mL via ORAL
  Filled 2020-07-15 (×6): qty 325

## 2020-07-15 NOTE — Plan of Care (Signed)
PT VS WNL this am.  No complaints at this time.  Problem: Education: Goal: Knowledge of General Education information will improve Description: Including pain rating scale, medication(s)/side effects and non-pharmacologic comfort measures Outcome: Progressing   Problem: Clinical Measurements: Goal: Ability to maintain clinical measurements within normal limits will improve Outcome: Progressing Goal: Will remain free from infection Outcome: Progressing Goal: Diagnostic test results will improve Outcome: Progressing Goal: Respiratory complications will improve Outcome: Progressing Goal: Cardiovascular complication will be avoided Outcome: Progressing   Problem: Coping: Goal: Level of anxiety will decrease Outcome: Progressing   Problem: Elimination: Goal: Will not experience complications related to bowel motility Outcome: Progressing Goal: Will not experience complications related to urinary retention Outcome: Progressing   Problem: Safety: Goal: Ability to remain free from injury will improve Outcome: Progressing   Problem: Skin Integrity: Goal: Risk for impaired skin integrity will decrease Outcome: Progressing

## 2020-07-15 NOTE — Progress Notes (Addendum)
HEMATOLOGY-ONCOLOGY PROGRESS NOTE  SUBJECTIVE: Remains on O2 at 4 L/min.  States that she was out of bed yesterday and did not tolerate it very well.  Continues to have ongoing dyspnea.  She has no other complaints this morning.  Oncology History  Non Hodgkin's lymphoma (Tracy Mclaughlin)  09/29/2016 Initial Diagnosis   Non Hodgkin's lymphoma (Tracy Mclaughlin)   03/22/2019 - 04/23/2019 Chemotherapy   The patient had palonosetron (ALOXI) injection 0.25 mg, 0.25 mg, Intravenous,  Once, 1 of 4 cycles Administration: 0.25 mg (03/22/2019) riTUXimab (RITUXAN) 800 mg in sodium chloride 0.9 % 250 mL (2.4242 mg/mL) infusion, 375 mg/m2 = 800 mg, Intravenous,  Once, 1 of 4 cycles Administration: 800 mg (03/22/2019) bendamustine (BENDEKA) 150 mg in sodium chloride 0.9 % 50 mL (2.6786 mg/mL) chemo infusion, 70 mg/m2 = 150 mg (100 % of original dose 70 mg/m2), Intravenous,  Once, 1 of 4 cycles Dose modification: 70 mg/m2 (original dose 70 mg/m2, Cycle 1, Reason: Provider Judgment) Administration: 150 mg (03/22/2019), 150 mg (03/23/2019)  for chemotherapy treatment.    05/18/2019 - 06/28/2019 Chemotherapy   The patient had DOXOrubicin (ADRIAMYCIN) chemo injection 82 mg, 40 mg/m2 = 82 mg (100 % of original dose 40 mg/m2), Intravenous,  Once, 2 of 6 cycles Dose modification: 40 mg/m2 (original dose 40 mg/m2, Cycle 1, Reason: Provider Judgment) Administration: 82 mg (05/18/2019), 82 mg (06/08/2019) palonosetron (ALOXI) injection 0.25 mg, 0.25 mg, Intravenous,  Once, 2 of 6 cycles Administration: 0.25 mg (05/18/2019), 0.25 mg (06/08/2019) pegfilgrastim (NEULASTA ONPRO KIT) injection 6 mg, 6 mg, Subcutaneous, Once, 2 of 6 cycles Administration: 6 mg (05/18/2019), 6 mg (06/08/2019) vinCRIStine (ONCOVIN) 1 mg in sodium chloride 0.9 % 50 mL chemo infusion, 1 mg (100 % of original dose 1 mg), Intravenous,  Once, 2 of 6 cycles Dose modification: 1 mg (original dose 1 mg, Cycle 1, Reason: Provider Judgment) Administration: 1 mg (05/18/2019), 1 mg  (06/08/2019) riTUXimab (RITUXAN) 800 mg in sodium chloride 0.9 % 250 mL (2.4242 mg/mL) infusion, 375 mg/m2 = 800 mg, Intravenous,  Once, 2 of 6 cycles Administration: 800 mg (05/18/2019), 800 mg (06/08/2019) cyclophosphamide (CYTOXAN) 1,240 mg in sodium chloride 0.9 % 250 mL chemo infusion, 600 mg/m2 = 1,540 mg, Intravenous,  Once, 2 of 6 cycles Dose modification: 600 mg/m2 (original dose 750 mg/m2, Cycle 2, Reason: Provider Judgment) Administration: 1,240 mg (05/18/2019), 1,240 mg (06/08/2019) fosaprepitant (EMEND) 150 mg, dexamethasone (DECADRON) 12 mg in sodium chloride 0.9 % 145 mL IVPB, , Intravenous,  Once, 2 of 6 cycles Administration:  (05/18/2019),  (06/08/2019)  for chemotherapy treatment.    07/19/2019 -  Chemotherapy   The patient had riTUXimab (RITUXAN) 700 mg in sodium chloride 0.9 % 250 mL (2.1875 mg/mL) infusion, 375 mg/m2 = 700 mg, Intravenous,  Once, 11 of 11 cycles Administration: 700 mg (07/19/2019), 700 mg (08/16/2019), 700 mg (09/13/2019), 700 mg (10/10/2019), 700 mg (11/07/2019), 700 mg (12/07/2019), 700 mg (01/04/2020), 700 mg (02/01/2020), 700 mg (03/05/2020), 700 mg (04/02/2020), 700 mg (04/30/2020) riTUXimab-pvvr (RUXIENCE) 700 mg in sodium chloride 0.9 % 250 mL (2.1875 mg/mL) infusion, 375 mg/m2 = 700 mg (original dose ), Intravenous,  Once, 0 of 1 cycle Dose modification: 375 mg/m2 (Cycle 12)  for chemotherapy treatment.     PHYSICAL EXAMINATION:  Vitals:   07/15/20 0554 07/15/20 0846  BP: (!) 121/41 (!) 129/43  Pulse: 62 64  Resp: 19   Temp: 97.7 F (36.5 C)   SpO2:     Filed Weights   07/11/20 0449 07/12/20 0500 07/15/20 0500  Weight: 70.8 kg 71.1 kg 65.5 kg    Intake/Output from previous day: 07/26 0701 - 07/27 0700 In: 813.5 [P.O.:420; IV Piggyback:393.5] Out: 1050 [Urine:1050]  GENERAL: Appears very weak, alert HEENT: No thrush, mouth is dry LYMPH: Soft mobile left cervical and left axillary nodes, these feel larger today LUNGS: Increased respiratory rate,  diminished breath sounds on the right HEART: Regular rate and rhythm ABDOMEN: Nontender, spleen tip palpable in the left subcostal region SKIN: Multiple ecchymoses noted on her bilateral arms, legs, and trunk NEURO: Alert and oriented x3 Vascular: No leg edema  LABORATORY DATA:  I have reviewed the data as listed CMP Latest Ref Rng & Units 07/15/2020 07/14/2020 07/13/2020  Glucose 70 - 99 mg/dL 102(H) 107(H) 106(H)  BUN 8 - 23 mg/dL _0 Creatinine 0.44 - 1.00 mg/dL 0.75 0.68 0.69  Sodium 135 - 145 mmol/L 138 136 140  Potassium 3.5 - 5.1 mmol/L 3.1(L) 2.8(L) 2.9(L)  Chloride 98 - 111 mmol/L 87(L) 84(L) 87(L)  CO2 22 - 32 mmol/L 40(H) 40(H) 40(H)  Calcium 8.9 - 10.3 mg/dL 7.2(L) 7.0(L) 7.0(L)  Total Protein 6.5 - 8.1 g/dL - - -  Total Bilirubin 0.3 - 1.2 mg/dL - - -  Alkaline Phos 38 - 126 U/L - - -  AST 15 - 41 U/L - - -  ALT 0 - 44 U/L - - -    Lab Results  Component Value Date   WBC 5.7 07/15/2020   HGB 10.4 (L) 07/15/2020   HCT 32.2 (L) 07/15/2020   MCV 104.9 (H) 07/15/2020   PLT 152 07/15/2020   NEUTROABS 2.8 06/23/2020    DG Chest 1 View  Result Date: 07/08/2020 CLINICAL DATA:  86 year old female status post right thoracentesis. EXAM: CHEST  1 VIEW COMPARISON:  Chest radiograph dated 07/06/2020. FINDINGS: Right-sided Port-A-Cath in similar position. Bilateral patchy airspace opacities, right greater left with interval progression compared to prior radiograph may represent edema or pneumonia. Clinical correlation is recommended. Small bilateral pleural effusions. No pneumothorax. Stable cardiac silhouette. Atherosclerotic calcification of the aorta. No acute osseous pathology. IMPRESSION: 1. Small bilateral pleural effusions.  No pneumothorax. 2. Bilateral airspace opacities, progressed since the prior radiograph. Electronically Signed   By: Anner Crete M.D.   On: 07/08/2020 18:26   DG Chest 1 View  Result Date: 07/04/2020 CLINICAL DATA:  Status post right  thoracentesis. EXAM: CHEST  1 VIEW COMPARISON:  Chest x-ray from yesterday. FINDINGS: Unchanged right chest wall port catheter. The heart size and mediastinal contours are within normal limits. Normal pulmonary vascularity. Trace residual right pleural effusion status post thoracentesis. Improved aeration at the right lung base. No pneumothorax. Unchanged small left pleural effusion and basilar atelectasis. No acute osseous abnormality. IMPRESSION: 1. Trace residual right pleural effusion status post thoracentesis. No pneumothorax. 2. Unchanged small left pleural effusion and basilar atelectasis. Electronically Signed   By: Titus Dubin M.D.   On: 07/04/2020 11:42   DG Chest 2 View  Result Date: 07/14/2020 CLINICAL DATA:  Dyspnea. EXAM: CHEST - 2 VIEW COMPARISON:  07/09/2020 FINDINGS: Right chest wall port a catheter is noted with tip at the cavoatrial junction. Moderate right pleural effusion the with [place] opacification of the right lower lobe is again noted. When compared with the previous exam there is improved aeration to the right and left midlung zones and left lower lobe. IMPRESSION: 1. Improved aeration to the right and left lower lobes. 2. Persistent right pleural effusion. Electronically Signed   By: Lovena Le  Clovis Riley M.D.   On: 07/14/2020 12:16   DG Chest 2 View  Result Date: 07/09/2020 CLINICAL DATA:  Follow-up pleural effusion. EXAM: CHEST - 2 VIEW COMPARISON:  07/08/20 FINDINGS: Right chest wall port a catheter is noted with tip projecting over the cavoatrial junction. Interval increase in volume of right pleural effusion. Mild diffuse pulmonary edema. Bilateral airspace opacities are noted within the left upper lobe, right midlung and right base concerning for multifocal pneumonia. IMPRESSION: 1. Increase in volume of right pleural effusion with mild interstitial edema 2. Bilateral airspace opacities concerning for multifocal pneumonia. Electronically Signed   By: Kerby Moors M.D.   On:  07/09/2020 11:21   DG Chest 2 View  Result Date: 07/06/2020 CLINICAL DATA:  Worsening shortness of breath. EXAM: CHEST - 2 VIEW COMPARISON:  07/04/2020 FINDINGS: The cardiac silhouette remains grossly normal in size, spur partially obscured by significantly increased patchy opacity in the right lower lung zone. Small amount of posterior pleural fluid. Mild linear density at the left lung base. Right subclavian porta catheter tip in the region of the superior cavoatrial junction. Unremarkable bones. IMPRESSION: 1. Significantly increased right lower lung zone pneumonia. 2. Small amount of posterior pleural fluid. 3. Mild left basilar atelectasis. Electronically Signed   By: Claudie Revering M.D.   On: 07/06/2020 16:24   DG Chest 2 View  Result Date: 07/03/2020 CLINICAL DATA:  Dyspnea, pleural effusions EXAM: CHEST - 2 VIEW COMPARISON:  06/26/2020 FINDINGS: Frontal and lateral views of the chest demonstrates stable right chest wall port. Cardiac silhouette is unremarkable. There is now a moderate right pleural effusion with right basilar consolidation. Decreased left pleural effusion, with only trace fluid at the left costophrenic angle. Persistent retrocardiac consolidation. No pneumothorax. No acute bony abnormalities. IMPRESSION: 1. Decreased left pleural effusion, with persistent left basilar consolidation. 2. Increasing right pleural effusion, with increasing right basilar consolidation. Electronically Signed   By: Randa Ngo M.D.   On: 07/03/2020 16:11   DG Chest 2 View  Result Date: 06/26/2020 CLINICAL DATA:  Dyspnea. EXAM: CHEST - 2 VIEW COMPARISON:  Chest CT 05/22/2020 FINDINGS: Right chest port remains in place. Hazy opacity at the left lung base consistent with pleural effusion and airspace disease/atelectasis. Right lung is clear. Upper normal heart size. Mild right hilar prominence likely secondary to combination of vascular structures and adenopathy is seen on recent CT. Fullness in the upper  mediastinum likely secondary to adenopathy. No pneumothorax. No pulmonary edema. Surgical clips in the left axilla. IMPRESSION: 1. Hazy opacity at the left lung base consistent with pleural effusion and airspace disease/atelectasis. 2. Mild right hilar prominence likely secondary to combination of vascular structures and adenopathy seen on recent CT. Electronically Signed   By: Keith Rake M.D.   On: 06/26/2020 15:54   CT ANGIO CHEST PE W OR WO CONTRAST  Result Date: 07/07/2020 CLINICAL DATA:  Lymphoma, recurrent right pleural effusion bloody tap EXAM: CT ANGIOGRAPHY CHEST WITH CONTRAST TECHNIQUE: Multidetector CT imaging of the chest was performed using the standard protocol during bolus administration of intravenous contrast. Multiplanar CT image reconstructions and MIPs were obtained to evaluate the vascular anatomy. CONTRAST:  183m OMNIPAQUE IOHEXOL 350 MG/ML SOLN COMPARISON:  None. FINDINGS: Cardiovascular: Aortic atherosclerosis. Normal heart size. Central pulmonary arteries are patent. Mediastinum/Nodes: Adenopathy is identified in the supraclavicular region, bilateral axilla, and mediastinum. Nodes appear and measures slightly smaller in size. Lungs/Pleura: Moderate right and mild to moderate left pleural effusions measuring simple fluid in density. Imaging performed during  expiration. Persistent atelectasis at the left lung base. New atelectasis/consolidation at the right lung base. Interlobular septal thickening the non dependent right lung may reflect asymmetric edema. Upper Abdomen: Ascites. Unchanged ill-defined hypoattenuating lesion in the superior spleen. Musculoskeletal: No new abnormality. Review of the MIP images confirms the above findings. IMPRESSION: Moderate right and mild to moderate left pleural effusions. Right basilar atelectasis/consolidation and left basilar atelectasis. Interlobular septal thickening in the right lung may reflect asymmetric edema. Multifocal adenopathy appears  stable to slightly improved. Electronically Signed   By: Macy Mis M.D.   On: 07/07/2020 14:33   CT Abdomen Pelvis W Contrast  Result Date: 06/23/2020 CLINICAL DATA:  86 year old female with abdominal pain, nausea and vomiting. History of lymphoma. EXAM: CT ABDOMEN AND PELVIS WITH CONTRAST TECHNIQUE: Multidetector CT imaging of the abdomen and pelvis was performed using the standard protocol following bolus administration of intravenous contrast. CONTRAST:  40m OMNIPAQUE IOHEXOL 300 MG/ML  SOLN COMPARISON:  CT of the chest abdomen pelvis dated 05/22/2020. FINDINGS: Lower chest: Partially visualized moderate left and trace right pleural effusions, new since the prior CT. There is associated partial compressive atelectasis of the left lower lobe. Pneumonia is not excluded. Clinical correlation is recommended. No intra-abdominal free air. Interval development of a small ascites, new or significantly increased since the prior CT. Hepatobiliary: A 1 cm hypodense focus along the posterior liver capsule is not characterized but may represent a cyst or focal area of scarring. This is similar to prior CT. Subcentimeter hypodense focus in the anterior liver is too small to characterize. There is mild intrahepatic biliary ductal dilatation, likely post cholecystectomy. The common bile duct is dilated measuring 14 mm. No retained calcified stone noted in the central CBD. Pancreas: The pancreas is unremarkable as visualized. Spleen: Mildly enlarged spleen measuring 15 cm in craniocaudal length. Adrenals/Urinary Tract: The adrenal glands are poorly visualized but grossly unremarkable. There is mild fullness of the renal collecting systems bilaterally without frank hydronephrosis. Mild hazy appearance of the urothelium noted. Correlation with urinalysis recommended to exclude UTI. There is symmetric enhancement and excretion of contrast by both kidneys. Subcentimeter left renal hypodense focus is not characterized. The  urinary bladder is partially distended. Probable chronic bladder wall thickening and perivesical stranding. Correlation with urinalysis recommended to exclude UTI. Stomach/Bowel: There is sigmoid diverticulosis without definite active inflammatory changes. There is postsurgical changes of bowel with anastomotic suture in the right lower quadrant. No definite evidence of bowel obstruction. Evaluation however is limited in the absence of oral contrast. Vascular/Lymphatic: There is moderate aortoiliac atherosclerotic disease. The IVC is unremarkable as visualized. The SMV, splenic vein, and main portal vein are patent. No portal venous gas. Bulky retroperitoneal adenopathy encasing the abdominal aorta and IVC as well as enlarged bilateral iliac chain lymph nodes in keeping with history of lymphoma. Overall the degree of adenopathy is relatively similar to prior CT. Reproductive: The uterus is poorly visualized. Other: Diffuse mesenteric edema, new since the prior CT. There is scattered mesenteric adenopathy as seen previously. Musculoskeletal: Degenerative changes of the spine. Minimal compression fracture of the anterior superior and inferior endplates of the L3, new since the prior CT, likely acute or subacute. IMPRESSION: 1. Interval development of partially visualized moderate left and trace right pleural effusions, as well as development of diffuse mesenteric edema and ascites, new since the prior CT. 2. Bulky retroperitoneal and iliac chain adenopathy in keeping with history of lymphoma. Overall the degree of adenopathy is relatively similar to prior CT. 3.  Mild splenomegaly. 4. Minimal compression fracture of the superior and inferior endplate of L3, likely acute or subacute. 5. Sigmoid diverticulosis. 6. Aortic Atherosclerosis (ICD10-I70.0). Electronically Signed   By: Anner Crete M.D.   On: 06/23/2020 16:39   ECHOCARDIOGRAM COMPLETE  Result Date: 07/07/2020    ECHOCARDIOGRAM REPORT   Patient Name:    Tracy Mclaughlin Date of Exam: 07/07/2020 Medical Rec #:  [id number]            Height:       68.0 in Accession #:    [id number]           Weight:       174.0 lb Date of Birth:  12/06/1934           BSA:          1.926 m Patient Age:    86 years             BP:           132/65 mmHg Patient Gender: F                    HR:           855 bpm. Exam Location:  Inpatient Procedure: 2D Echo Indications:    Atrial tachycardia [facility]) [[id number]]  History:        Patient has prior history of Echocardiogram examinations, most                 recent 05/31/2019. Risk Factors:Hypertension. Lymphoma , thyroid                 disease, thyroid cancer. Past history of afib.  Sonographer:    Darlina Sicilian RDCS Referring Phys: [place]  1. Left ventricular ejection fraction, by estimation, is 60 to 65%. The left ventricle has normal function. The left ventricle has no regional wall motion abnormalities. Left ventricular diastolic parameters were normal.  2. Right ventricular systolic function is normal. The right ventricular size is normal.  3. Moderate pleural effusion in both left and right lateral regions.  4. The mitral valve is normal in structure. Trivial mitral valve regurgitation. No evidence of mitral stenosis.  5. The aortic valve is tricuspid. Aortic valve regurgitation is not visualized. No aortic stenosis is present.  6. The inferior vena cava is normal in size with <50% respiratory variability, suggesting right atrial pressure of 8 mmHg. Conclusion(s)/Recommendation(s): Normal biventricular function without evidence of hemodynamically significant valvular heart disease. FINDINGS  Left Ventricle: Left ventricular ejection fraction, by estimation, is 60 to 65%. The left ventricle has normal function. The left ventricle has no regional wall motion abnormalities. The left ventricular internal cavity size was normal in size. There is  no left ventricular hypertrophy. Left ventricular diastolic parameters  were normal. Right Ventricle: The right ventricular size is normal. No increase in right ventricular wall thickness. Right ventricular systolic function is normal. Left Atrium: Left atrial size was normal in size. Right Atrium: Right atrial size was normal in size. Pericardium: Trivial pericardial effusion is present. Mitral Valve: The mitral valve is normal in structure. Trivial mitral valve regurgitation. No evidence of mitral valve stenosis. Tricuspid Valve: The tricuspid valve is normal in structure. Tricuspid valve regurgitation is mild . No evidence of tricuspid stenosis. Aortic Valve: The aortic valve is tricuspid. Aortic valve regurgitation is not visualized. No aortic stenosis is present. Pulmonic Valve: The pulmonic valve was grossly normal. Pulmonic valve regurgitation is trivial. Aorta:  The aortic root, ascending aorta and aortic arch are all structurally normal, with no evidence of dilitation or obstruction. Venous: The inferior vena cava is normal in size with less than 50% respiratory variability, suggesting right atrial pressure of 8 mmHg. IAS/Shunts: No atrial level shunt detected by color flow Doppler. Additional Comments: There is a moderate pleural effusion in both left and right lateral regions.  LEFT VENTRICLE PLAX 2D LVIDd:         5.10 cm  Diastology LVIDs:         3.30 cm  LV e' lateral:   8.27 cm/s LV PW:         0.90 cm  LV E/e' lateral: 9.2 LV IVS:        0.70 cm  LV e' medial:    6.64 cm/s LVOT diam:     1.80 cm  LV E/e' medial:  11.5 LV SV:         30 LV SV Index:   15 LVOT Area:     2.54 cm  RIGHT VENTRICLE RV S prime:     14.80 cm/s TAPSE (M-mode): 2.4 cm LEFT ATRIUM           Index       RIGHT ATRIUM           Index LA diam:      3.00 cm 1.56 cm/m  RA Area:     13.80 cm LA Vol (A2C): 44.7 ml 23.21 ml/m RA Volume:   33.50 ml  17.39 ml/m LA Vol (A4C): 42.6 ml 22.12 ml/m  AORTIC VALVE LVOT Vmax:   77.70 cm/s LVOT Vmean:  49.700 cm/s LVOT VTI:    0.116 m  AORTA Ao Root diam: 3.00  cm MITRAL VALVE MV Area (PHT): 4.96 cm    SHUNTS MV Decel Time: 153 msec    Systemic VTI:  0.12 m MV E velocity: 76.30 cm/s  Systemic Diam: 1.80 cm MV A velocity: 78.80 cm/s MV E/A ratio:  0.97 Buford Dresser MD Electronically signed by Buford Dresser MD Signature Date/Time: 07/07/2020/5:02:22 PM    Final    [place] ASCITES (ABDOMEN LIMITED)  Result Date: 07/01/2020 CLINICAL DATA:  History of lymphoma, now with concern for symptomatic intra-abdominal ascites. Please perform ascites search ultrasound and ultrasound-guided paracentesis as indicated. EXAM: LIMITED ABDOMEN ULTRASOUND FOR ASCITES TECHNIQUE: Limited ultrasound survey for ascites was performed in all four abdominal quadrants. COMPARISON:  CT abdomen pelvis-06/23/2020 FINDINGS: Sonographic evaluation demonstrates a trace amount of intra-abdominal ascites, too small to allow for safe ultrasound-guided paracentesis. Note is made of small bilateral pleural effusions, the right side of which appears new compared to abdominal CT performed 06/23/2020. IMPRESSION: 1. Trace amount of intra-abdominal ascites, too small to allow for safe ultrasound-guided paracentesis. No paracentesis attempted. 2. Small bilateral pleural effusions - the left-sided pleural effusion appears similar to abdominal CT performed 06/23/2020 however the right-sided pleural effusion appears new of the CT. Electronically Signed   By: Sandi Mariscal M.D.   On: 07/01/2020 15:11   US Abdomen Limited RUQ  Result Date: 07/02/2020 CLINICAL DATA:  Abnormal LFTs EXAM: ULTRASOUND ABDOMEN LIMITED RIGHT UPPER QUADRANT COMPARISON:  CT 06/23/2020.  Ultrasound 07/01/2020 FINDINGS: Gallbladder: Prior cholecystectomy Common bile duct: Diameter: Prominent measuring up to 10 mm, likely related to post cholecystectomy state. Liver: Heterogeneous, slightly increased echotexture. Subtle nodular contours to the liver surface. Findings raise the possibility of cirrhosis. No suspicious focal hepatic  abnormality. Portal vein is patent on color Doppler imaging with normal direction  of blood flow towards the liver. Other: Ascites and right effusion noted. Prominent porta hepatis lymph nodes. IMPRESSION: Heterogeneous, increased echotexture throughout the liver. Subtle nodular contour seen in some areas of the liver. Findings suggest the possibility of cirrhosis. Perihepatic ascites and right pleural effusion. Mild biliary ductal dilatation, likely related to post cholecystectomy state. Prominent porta hepatis lymph nodes, likely related to liver disease. Electronically Signed   By: Rolm Baptise M.D.   On: 07/02/2020 08:52   US THORACENTESIS ASP PLEURAL SPACE W/IMG GUIDE  Result Date: 07/08/2020 INDICATION: Lymphoma, remote colon and thyroid cancers, dyspnea, right pleural effusion.request for diagnostic and therapeutic thoracentesis. EXAM: ULTRASOUND GUIDED RIGHT THORACENTESIS MEDICATIONS: 1% lidocaine 10 mL COMPLICATIONS: None immediate. PROCEDURE: An ultrasound guided thoracentesis was thoroughly discussed with the patient and questions answered. The benefits, risks, alternatives and complications were also discussed. The patient understands and wishes to proceed with the procedure. Written consent was obtained. Ultrasound was performed to localize and mark an adequate pocket of fluid in the right chest. The area was then prepped and draped in the normal sterile fashion. 1% Lidocaine was used for local anesthesia. Under ultrasound guidance a 6 Fr Safe-T-Centesis catheter was introduced. Thoracentesis was performed. The catheter was removed and a dressing applied. FINDINGS: A total of approximately 500 mL of red fluid was removed. Samples were sent to the laboratory as requested by the clinical team. IMPRESSION: Successful ultrasound guided right thoracentesis yielding 500 mL of pleural fluid. Read by: Gareth Eagle, PA-C Electronically Signed   By: Jerilynn Mages.  Shick M.D.   On: 07/08/2020 16:31   US THORACENTESIS ASP  PLEURAL SPACE W/IMG GUIDE  Result Date: 07/04/2020 INDICATION: Patient with history of lymphoma, remote colon and thyroid cancers, dyspnea, right pleural effusion. Request made for diagnostic and therapeutic right thoracentesis. EXAM: ULTRASOUND GUIDED DIAGNOSTIC AND THERAPEUTIC RIGHT THORACENTESIS MEDICATIONS: 1% lidocaine to skin and subcutaneous tissue COMPLICATIONS: None immediate. PROCEDURE: An ultrasound guided thoracentesis was thoroughly discussed with the patient and questions answered. The benefits, risks, alternatives and complications were also discussed. The patient understands and wishes to proceed with the procedure. Written consent was obtained. Ultrasound was performed to localize and mark an adequate pocket of fluid in the right chest. The area was then prepped and draped in the normal sterile fashion. 1% Lidocaine was used for local anesthesia. Under ultrasound guidance a 6 Fr Safe-T-Centesis catheter was introduced. Thoracentesis was performed. The catheter was removed and a dressing applied. FINDINGS: A total of approximately 1.6 liters of blood-tinged fluid was removed. Samples were sent to the laboratory as requested by the clinical team. IMPRESSION: Successful ultrasound guided diagnostic and therapeutic right thoracentesis yielding 1.6 liters of pleural fluid. Read by: Rowe Robert, PA-C Electronically Signed   By: Lucrezia Europe M.D.   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 Status post weekly Rituxan x4 03/01/2012 through 03/22/2012. She completed 4 "maintenance" doses of Rituxan, last on 12/19/2012. A restaging CT on 02/09/2013 showed no evidence of lymphoma.   Lymph node lateral to the thyroid bed on a neck ultrasound 02/21/2014, status post an FNA biopsy concerning for a lymphoproliferative disorder.  PET scan 09/28/2016 with  active lymphoma within the neck, chest, abdomen, pelvis; splenic enlargement and hypermetabolism suspicious for splenic involvement.  Initiation of Rituxan weekly 4 09/29/2016  Initiation of maintenance Rituxan on a 3 month schedule 12/23/2016; final Rituxan 08/31/2018  Thyroid ultrasound 02/07/2019-left  cervical lymphadenopathy  PET scan 03/08/2019-extensive recurrent hypermetabolic lymphoma involving the neck, chest, abdomen and pelvis.  03/16/2019 left cervical lymph node biopsy-features consistent with previously diagnosed non-Hodgkin's B-cell lymphoma, phenotypically consistent with marginal zone lymphoma. Flow cytometry with lambda restricted B-cell population without expression of CD5 or CD10 comprising 87% of all lymphocytes.  Cycle 1 bendamustine/Rituxan 03/22/2019  Excision deep left axillary lymph nodes 05/04/2019-non-Hodgkin's B-cell lymphoma with differential including a marginal zone lymphoma and atypical small lymphocytic lymphoma. Flow cytometry with monoclonal B-cell population without expression of CD5 or CD10, comprises 96% of all lymphocytes.  Cycle 1 CHOP/Rituxan 05/18/2019  Cycle 2 CHOP/Rituxan 06/08/2019  CTs 06/15/2019-partial improvement in diffuse adenopathy, stable mild splenomegaly  Cycle 1 Revlimid/rituximab 07/19/2019 (Revlimid start 07/20/2019)  Cycle 2 Revlimid/rituximab 08/16/2019 (Revlimid placed on hold 08/30/2019 due to neutropenia)  Cycle 3 of Revlimid/rituximab 09/13/2019 (Revlimid schedule changed to 14 days on/14 days off)  Cycle 4 Revlimid/rituximab 10/10/2019  Cycle 5 Revlimid/rituximab 11/07/2019  Cycle 6 Revlimid/rituximab 12/07/2019  CTs 12/28/2019-diffuse lymphadenopathy-slightly increased compared to 06/15/2019  Cycle 7 Revlimid/rituximab 01/04/2020  Cycle 8 Revlimid/Rituxan 02/01/2020  Cycle 9 Revlimid/Rituxan 03/05/2020  Cycle 10 Revlimid/Rituxan 04/02/2020  Cycle 11 Revlimid/Rituxan 04/30/2020  CT 05/22/2020-mild increase in left  supraclavicular adenopathy, mild increase in chest, retroperitoneal, and pelvic adenopathy. Stable mild splenomegaly.  Acalabrutinib started 06/19/2020  CT abdomen/pelvis 06/23/2020-moderate left and trace right pleural effusions, new diffuse mesenteric edema and ascites, stable retroperitoneal and iliac adenopathy, mild splenomegaly 2. Stage IV (T1bN1b M0) papillary thyroid cancer, status post a thyroidectomy with reimplantation of the left superior parathyroid gland on 05/23/2012, status post radioactive iodine therapy, followed by Dr. Buddy Duty.  3. Stage II (T3 N0) colon cancer, status post a right colectomy 10/19/2011, last colonoscopy April 2015-sigmoid adenoma removed.  4. History of a pulmonary embolism December 2012.  5. History of Atrial fibrillation 6. Iron deficiency anemia-new 03/18/2014. Hemoccult positive stool. The anemia corrected with iron. No longer taking iron.  Status post an upper endoscopy and colonoscopy by Dr. Carlean Purl April 2015 with no bleeding source identified, benign adenoma removed from the sigmoid colon. 7. Report of an upper gastric intestinal bleed fall 2016-managed in [place]. [name] 8. Left knee replacement May 2017, repeat left knee surgery May 2018 9. Pruritic rash 07/22/2016 10. Nausea and diarrhea 04/02/2019-stool negative for C. difficile toxin 11. 06/18/2019 [facility] admission for symptomatic anemia. 12.  Admission 06/23/2020 with nausea and diarrhea 13.  Respiratory failure, bilateral pleural effusions  Right thoracentesis 07/04/2020-culture and cytology negative  CT chest 07/07/2020-right and left pleural effusions, bilateral basilar atelectasis, interlobular septal thickening in the right lung-asymmetric edema?  Improved adenopathy  Right thoracentesis 07/08/2020  Ms. Legault continues to have dyspnea and generalized weakness.  Chest x-ray from 07/14/2020 showed reaccumulation of her pleural effusion. She was able to get to the chair with nursing yesterday and they  noted that she did better than expected.  Recommendations: 1.  Continue to hold acalabrutinib 2.  Continue to attempt to wean O2 as tolerated. 3.  Recommend PT/OT consult to evaluate her ability to return home versus SNF  4.  Repeat right thoracentesis tomorrow if a repeat chest x-ray reveals a significant right effusion   LOS: 21 days   Mikey Bussing 07/15/20  Ms. Auble was interviewed and examined.  She continues to have respiratory failure.  It remains unclear whether the respiratory failure is secondary to lymphoma or infection.  She is being treated for a possible staph empyema.  Flow cytometry on the pleural fluid from 07/08/2020  revealed a small clonal B-cell population, not diagnostic of a malignant effusion.  We will repeat flow cytometry and cytology if another thoracentesis is needed.  Julieanne Manson, MD

## 2020-07-15 NOTE — Progress Notes (Signed)
PROGRESS NOTE    Tracy Mclaughlin  EQA:[id number] DOB: [birth year] DOA: 06/23/2020 PCP: [name], PA    Brief Narrative:  This patient is 86 y.o.femalewith medical history significant forlow-grade lymphoma without significant response to chemotherapy last year and was recently started onBTK INHIBITORby Dr. [name], hypothyroidism, history of A. fib, pancytopenia presents to the ED with3-4 day history of nausea vomiting and diarrhea and abdominal pain-and right upper quadrant pain.symptoms started after new chemo on Thursday.Patient denies any fever,  chest pain.vomitted x 3 today and had non bloody diarrhea 4 times today.  ED Course:Hemodynamically stable, afebrile, UA positive for UTI, CT scan abdomen done that showed free fluid with ascites and mesenteric edema, case was discussed Dr. [name] from oncology,  advised UTI treatment with Zosyn .  Patient  was admitted, being managed symptomatically, chemo was held,  diarrhea improved subsequently,  oncology put the patient back on chemo overall tolerating well. Having nausea anorexia suspecting this is related to her lymphoma. Patient has been started on prednisone to help with appetite and also received trazodone for sleep. Patient remains deconditioned.  She was found to get more sleepy and confused subsequently trazodone and prednisone were discontinued with resolution of her confusion. Patient was also noticed to have shortness of breath/dyspnea with activity-imaging studies showed pleural effusion underwent right-sided thoracentesis with 1.6 L of blood-tinged fluid removal, cytology reactive medically stable, no growth on culture. PT had suggested skilled nursing facility,  patient had PICC line, plan:  SNF as well as home health.  Patient went into rapid tachycardia supraventricular 7/19 a.m. and also with hypoxia-underwent CTA chest no PE but right-sided pleural effusion atelectasis/consolidation-cardiologist consulted.   Patient converted to sinus rhythm spontaneously. received few days of IV antibiotics , Procalcitonin has been negative/normal- no fever no leukocytosis. Patient continues to remain hypoxic and in respiratory failure, scheduled to have thoracocentesis.   Assessment & Plan:   Principal Problem:   Recurrent pleural effusion on right Active Problems:   Non Hodgkin's lymphoma (HCC)   Hypertension   Anemia, iron deficiency   PAF (paroxysmal atrial fibrillation) (HCC)   Anemia   Pancytopenia (HCC)   Nausea & vomiting   UTI (urinary tract infection)   Ascites   Acute respiratory failure with hypoxia and hypercapnia (HCC)  Acute hypoxic respiratory failure due to pleural effusion / No PE on the CT. -Stable, still requiring 4 L of oxygen, to maintain O2 sat greater than 92% -Currently satting 97% -Pleural cultures are growing gram-positive cocci - on  broad-spectrum antibiotics  Vanco and Zosyn  -we will modify and narrow down antibiotics according to final cultures and sensitivity. -Pleural fluid grew staph epi, we will continue with vancomycin and DC Zosyn.  -Remains in respiratory distress O2 dependent, hypoxic,  worsening shortness of breath with minimal exertion - Recurrent pleural effusion -07/08/2020 US guidedrightthoracentesis. Yielded500 mLof redfluid. CXR: 1. Increase in volume of right pleural effusion with mild interstitial edema 2. Bilateral airspace opacities concerning for multifocal pneumonia.  -Continue gentle diuretics -As needed duo nebs  -We will continue empiric antibiotics -Low suspicion for pneumonia, sepsis. -Patient needs repeat thoracocentesis for cytology  Intractable nausea vomitingdiarrhea andabdominal pain: -Resolved -Unclear etiology suspecting due to [place] held on admission and resumed 7/8. Diarrhea improved.   Acalabrutinib  was again discontinued as her LFTs were worsening. Has new onset ascites on the CT scan-but too  small to tap on the ultrasound . Cont supportive measure.  - Prednisone was added to stimulate  appetite but discontinued due to confusion  Recurrent pleural effusion: -Likely from  Lymphoma Vs infection or 2/2 Acalabrutinib ( less likely per oncology) . S/p rt thoracentesis 1.6 L blood-tinged fluid was removed.  Pleural fluid Gram stain no organism, culture no growth so far, cytology reactive mesothelial cell.   CTA shows moderate right and mild to moderate left pleural effusions-BNP around 400  -Echo shows preserved EJF 60 to 65%.  no diastolic dysfunction, unclear if related to CHF versus low Albumin.   Cardiology on board will start trial of IV Lasix low-dose, I's and O's: +10.1 L  - ordered US guided thoracentesis along with labs/cytology, discussed with cardio and  Oncology.  Narrow complex tachycardia - Resolved.  Converted to sinus rhythm.  No recurrent episode -remained stable- Cardiology following closely -Continue diltiazem CD 180 nightly, Lopressor 12.5 mg p.o. twice daily, -Following thyroid function -2D echocardiogram reviewed.  Ascites: -new onset ascites on the CT scan-too small to tap on paracentesis per IR.  Abnormal LFTS: -Improving 2/2 Acalabrutinib-which is discontinued.  LFTs improving.RUQ [place]- possible cirrhotic changes, hepatitis panel negative.   Acute metabolic encephalopathy from polypharmacy: -Mentation back to baseline  had  confusion/Lethargy: 7/14 a.m likley polypharmacy and pt feels improved after stopping trazodone/prednisone.   -Continue to complain of insomnia, sleeping medication as needed  She is on Xanax low-dose continue for anxiety.  Continue supportive care.   E coli UTI VHQ:IONGEXBMW 7 days of antibiotics.   Non Hodgkin's lymphoma -Remained stable, -Oncology following -with progressive decline, patientwasstartedpast weekonAcalabrutinib she did not have significant response to chemotherapy last year.Acalabrutinibwas held on admission  and resumed  But again stopped due to worsening LFTs by oncology.   Failure to thrive/deconditioning: Remains weak and deconditioned.  -PT continues to recommend SNF -She refused SNF.  She is high risk for readmission.  Husband concerned about poor support.  Hypertension: BP is controlled.  Continue Cardizem.    Pancytopenia with anemia/Thrombocytopenia in the setting of lymphoma/chemo: -Hemoglobin and WBC count overall stable.  Monitor. Heparin was discontinued by hematology due to bilateral lower leg bruise and thrombocytopenia.    History of intermittent atrial fibrillation on Cardizem: - Not on anticoagulation due to history of subarachnoid hemorrhage as per cardiology. Bruise on the skin heparin discontinued per oncology. improving.   DVT prophylaxis: Place and maintain sequential compression device Start: 06/30/20 0901 Heparin stopped due to  extensive LE bruise 7/12 and low platelet counts by hematology. Code Status: DNR Family Communication: Discussed with patient today.    Status is: Inpatient Remains inpatient appropriate because: Patient remains deconditioned weak, hypoxic.  Dispo: The patient is from: Home   Anticipated d/c is to: SNF as per PT- pt declines SNF and HH.  Continued to engage on placement recomendations.  Anticipated d/c date is: 2 days.  Once shortness is better, and cleared by her oncologist.discussed with her oncology   Patient currently not medically stable.   Consultants:   Hematology, Pulmonology  Procedures: Right thoracocentesis  Antimicrobials: Anti-infectives (From admission, onward)   Start     Dose/Rate Route Frequency Ordered Stop   07/10/20 1600  vancomycin (VANCOREADY) IVPB 750 mg/150 mL     Discontinue     750 mg 150 mL/hr over 60 Minutes Intravenous Every 12 hours 07/10/20 1332     07/10/20 1430  piperacillin-tazobactam (ZOSYN) IVPB 3.375 g  Status:  Discontinued        3.375 g 12.5 mL/hr over  240 Minutes Intravenous Every 8 hours 07/10/20 1332 07/15/20 1333  07/06/20 1800  cefTRIAXone (ROCEPHIN) 1 g in sodium chloride 0.9 % 100 mL IVPB  Status:  Discontinued        1 g 200 mL/hr over 30 Minutes Intravenous Every 24 hours 07/06/20 1646 07/08/20 1037   07/06/20 1800  azithromycin (ZITHROMAX) tablet 500 mg  Status:  Discontinued        500 mg Oral Daily 07/06/20 1646 07/08/20 1037   06/27/20 0830  cefTRIAXone (ROCEPHIN) 1 g in sodium chloride 0.9 % 100 mL IVPB  Status:  Discontinued        1 g 200 mL/hr over 30 Minutes Intravenous Every 24 hours 06/27/20 0733 06/30/20 1340   06/24/20 0200  piperacillin-tazobactam (ZOSYN) IVPB 3.375 g  Status:  Discontinued        3.375 g 12.5 mL/hr over 240 Minutes Intravenous Every 8 hours 06/23/20 1703 06/27/20 0733   06/23/20 1645  piperacillin-tazobactam (ZOSYN) IVPB 3.375 g        3.375 g 100 mL/hr over 30 Minutes Intravenous  Once 06/23/20 1638 06/23/20 1834   06/23/20 1615  cefTRIAXone (ROCEPHIN) 1 g in sodium chloride 0.9 % 100 mL IVPB  Status:  Discontinued        1 g 200 mL/hr over 30 Minutes Intravenous  Once 06/23/20 1607 06/23/20 1608     Subjective: Patient was seen and examined at bedside.  No overnight events.  She appears comfortable but continues to have shortness of breath on exertion.  she is scheduled to have a thoracocentesis.   Objective: Vitals:   07/14/20 2252 07/15/20 0500 07/15/20 0554 07/15/20 0846  BP: (!) 108/44  (!) 121/41 (!) 129/43  Pulse: 70  62 64  Resp: 19  19   Temp: 97.7 F (36.5 C)  97.7 F (36.5 C)   TempSrc: Oral     SpO2: 92%     Weight:  65.5 kg    Height:        Intake/Output Summary (Last 24 hours) at 07/15/2020 1358 Last data filed at 07/15/2020 0937 Gross per 24 hour  Intake 713.5 ml  Output 1050 ml  Net -336.5 ml   Filed Weights   07/11/20 0449 07/12/20 0500 07/15/20 0500  Weight: 70.8 kg 71.1 kg 65.5 kg    Examination:  General exam: Appears calm and comfortable    Respiratory system: Clear to auscultation. Respiratory effort normal.  Porta catheter on the chest. Cardiovascular system: S1 & S2 heard, RRR. No JVD, murmurs, rubs, gallops or clicks. No pedal edema. Gastrointestinal system: Abdomen is nondistended, soft and nontender. No organomegaly or masses felt. Normal bowel sounds heard. Central nervous system: Alert and oriented. No focal neurological deficits. Extremities:  No pain, Bruising noted on LE Skin: No rashes, lesions or ulcers Psychiatry: Judgement and insight appear normal. Mood & affect appropriate.     Data Reviewed: I have personally reviewed following labs and imaging studies  CBC: Recent Labs  Lab 07/11/20 0321 07/12/20 0400 07/13/20 0442 07/14/20 0416 07/15/20 0408  WBC 4.4 4.5 4.6 4.8 5.7  HGB 10.2* 9.7* 9.9* 10.4* 10.4*  HCT 32.3* 30.7* 31.4* 32.6* 32.2*  MCV 106.6* 106.6* 106.1* 106.5* 104.9*  PLT 124* 126* 123* 128* 453   Basic Metabolic Panel: Recent Labs  Lab 07/09/20 0537 07/10/20 0322 07/11/20 0321 07/12/20 0400 07/13/20 0442 07/14/20 0416 07/15/20 0408  NA 141   < > 143 142 140 136 138  K 3.9   < > 3.2* 3.0* 2.9* 2.8* 3.1*  CL 101   < >  93* 92* 87* 84* 87*  CO2 34*   < > 38* 41* 40* 40* 40*  GLUCOSE 120*   < > 117* 108* 106* 107* 102*  BUN 10   < > 15 16 16 14 13   CREATININE 0.46   < > 0.67 0.73 0.69 0.68 0.75  CALCIUM 7.7*   < > 7.5* 7.3* 7.0* 7.0* 7.2*  MG 1.7  --   --   --  1.4*  --   --   PHOS  --   --   --   --  3.8  --   --    < > = values in this interval not displayed.   GFR: Estimated Creatinine Clearance: 53.7 mL/min (by C-G formula based on SCr of 0.75 mg/dL). Liver Function Tests: No results for input(s): AST, ALT, ALKPHOS, BILITOT, PROT, ALBUMIN in the last 168 hours. No results for input(s): LIPASE, AMYLASE in the last 168 hours. No results for input(s): AMMONIA in the last 168 hours. Coagulation Profile: No results for input(s): INR, PROTIME in the last 168 hours. Cardiac  Enzymes: No results for input(s): CKTOTAL, CKMB, CKMBINDEX, TROPONINI in the last 168 hours. BNP (last 3 results) No results for input(s): PROBNP in the last 8760 hours. HbA1C: No results for input(s): HGBA1C in the last 72 hours. CBG: Recent Labs  Lab 07/09/20 0753 07/09/20 1632  GLUCAP 103* 100*   Lipid Profile: No results for input(s): CHOL, HDL, LDLCALC, TRIG, CHOLHDL, LDLDIRECT in the last 72 hours. Thyroid Function Tests: No results for input(s): TSH, T4TOTAL, FREET4, T3FREE, THYROIDAB in the last 72 hours. Anemia Panel: No results for input(s): VITAMINB12, FOLATE, FERRITIN, TIBC, IRON, RETICCTPCT in the last 72 hours. Sepsis Labs: Recent Labs  Lab 07/09/20 1559  PROCALCITON <0.10    Recent Results (from the past 240 hour(s))  Gram stain     Status: None   Collection Time: 07/08/20  5:03 PM   Specimen: Fluid  Result Value Ref Range Status   Specimen Description FLUID PLEURAL  Final   Special Requests   Final    BOTTLES DRAWN AEROBIC AND ANAEROBIC Blood Culture adequate volume   Gram Stain   Final    RARE WBC PRESENT, PREDOMINANTLY MONONUCLEAR NO ORGANISMS SEEN Performed at [facility], [place] [address], [place], [place] [address]    Report Status 07/08/2020 FINAL  Final  Culture, body fluid-bottle     Status: Abnormal   Collection Time: 07/08/20  5:04 PM   Specimen: Fluid  Result Value Ref Range Status   Specimen Description FLUID PLEURAL  Final   Special Requests   Final    BOTTLES DRAWN AEROBIC AND ANAEROBIC Blood Culture adequate volume   Gram Stain   Final    GRAM POSITIVE COCCI IN CLUSTERS AEROBIC BOTTLE ONLY CRITICAL RESULT CALLED TO, READ BACK BY AND VERIFIED WITH: RN RUTH M. [id number] FCP Performed at [facility], [address], [place], [place] [address]    Culture STAPHYLOCOCCUS EPIDERMIDIS (A)  Final   Report Status 07/12/2020 FINAL  Final   Organism ID, Bacteria STAPHYLOCOCCUS EPIDERMIDIS  Final      Susceptibility    Staphylococcus epidermidis - MIC*    CIPROFLOXACIN 2 INTERMEDIATE Intermediate     ERYTHROMYCIN >=8 RESISTANT Resistant     GENTAMICIN 2 SENSITIVE Sensitive     OXACILLIN >=4 RESISTANT Resistant     TETRACYCLINE >=16 RESISTANT Resistant     VANCOMYCIN 2 SENSITIVE Sensitive     TRIMETH/SULFA  20 SENSITIVE Sensitive     CLINDAMYCIN <=0.25 SENSITIVE Sensitive     RIFAMPIN <=0.5 SENSITIVE Sensitive     Inducible Clindamycin NEGATIVE Sensitive     * STAPHYLOCOCCUS EPIDERMIDIS     Radiology Studies: DG Chest 2 View  Result Date: 07/14/2020 CLINICAL DATA:  Dyspnea. EXAM: CHEST - 2 VIEW COMPARISON:  07/09/2020 FINDINGS: Right chest wall port a catheter is noted with tip at the cavoatrial junction. Moderate right pleural effusion the with [place] opacification of the right lower lobe is again noted. When compared with the previous exam there is improved aeration to the right and left midlung zones and left lower lobe. IMPRESSION: 1. Improved aeration to the right and left lower lobes. 2. Persistent right pleural effusion. Electronically Signed   By: Kerby Moors M.D.   On: 07/14/2020 12:16    Scheduled Meds: . Chlorhexidine Gluconate Cloth  6 each Topical Daily  . citalopram  20 mg Oral Daily  . diltiazem  180 mg Oral QHS  . [START ON 07/16/2020] feeding supplement (KATE FARMS STANDARD 1.4)  325 mL Oral Daily  . feeding supplement (PRO-STAT SUGAR FREE 64)  30 mL Oral Daily  . furosemide  20 mg Intravenous BID  . heparin lock flush  500 Units Intracatheter Q30 days  . levothyroxine  224 mcg Oral Q0600  . melatonin  3 mg Oral QHS  . metoprolol tartrate  12.5 mg Oral BID  . multivitamin with minerals  1 tablet Oral Daily  . pantoprazole  40 mg Oral Daily   Continuous Infusions: . sodium chloride 10 mL/hr at 07/07/20 0945  . vancomycin 750 mg (07/15/20 0422)     LOS: 21 days    Time spent: 25 mins.    Shawna Clamp, MD [facility]   If 7PM-7AM, please contact night-coverage

## 2020-07-15 NOTE — Progress Notes (Signed)
Nutrition Follow-up  DOCUMENTATION CODES:   Not applicable  INTERVENTION:  - continue D[place] but will decrease from BID to once/day. - continue Prosource Plus once/day.  NUTRITION DIAGNOSIS:   Increased nutrient needs related to acute illness, cancer and cancer related treatments, chronic illness as evidenced by estimated needs. -ongoing  GOAL:   Patient will meet greater than or equal to 90% of their needs -unmet  MONITOR:   PO intake, Supplement acceptance, Labs, Weight trends  ASSESSMENT:   86 y.o. female with medical history of low-grade lymphoma without significant response to chemotherapy last year and was recently started on BTK inhibitor, hypothyroidism, and A. fib. She presented to the ED with 3-4 day history of N/V/D and abdominal pain (RUQ). Symptoms started after new chemo initiation. UA on admission was positive for UTI. CT abdomen in the ED showed free fluid, ascites, and mesenteric edema. She was re-started on chemo during this admission but continues to have nausea and anorexia which is thought to be 2/2 lymphoma and treatment; started on prednisone to aid with appetite.  Patient has recently consumed the following at meals: 7/21: 0% of breakfast, 25% of dinner  7/22: 10% of breakfast 7/24: 0% of breakfast 7/25: 75% of breakfast 7/26: 20% of breakfast, 30% of lunch, 50% of dinner  Per review of orders, she has been accepting [employer] 25% of the time offered and Prosource 75% of the time offered.   Weight today is the lowest weight sine admission on 7/5. Noted lasix order. Will continue to monitor closely.  Ongoing dyspnea and generalized weakness. CXR 7/26 showed re-accumulation of PE.   Labs reviewed; K: 3.1 mmol/l, Cl: 87 mmol/l, Ca: 7.2 mg/dl. Medications reviewed; 20 mg IV lasix BID, 224 mcg oral synthroid/day, PRN imodium, 3 mg melatonin/day, 1 tablet multivitamin with minerals/day, 40 mg oral protonix/day, 10 mEq IV KCl x4 runs 7/26.    Diet  Order:   Diet Order            Diet regular Room service appropriate? Yes; Fluid consistency: Thin  Diet effective now                 EDUCATION NEEDS:   No education needs have been identified at this time  Skin:  Skin Assessment: Reviewed RN Assessment  Last BM:  7/25  Height:   Ht Readings from Last 1 Encounters:  06/23/20 5\' 8"  (1.727 m)    Weight:   Wt Readings from Last 1 Encounters:  07/15/20 65.5 kg     Estimated Nutritional Needs:  Kcal:  1700-1900 kcal Protein:  75-85 grams Fluid:  >/= 2 L/day     Jarome Matin, MS, RD, LDN, CNSC Inpatient Clinical Dietitian RD pager # available in [facility]  After hours/weekend pager # available in [facility]

## 2020-07-15 NOTE — Progress Notes (Signed)
Physical Therapy Treatment Patient Details Name: Tracy Mclaughlin MRN: [id number] DOB: 09-Jul-1934 Today's Date: 07/15/2020    History of Present Illness 86 yo female admitted with N/V/D, abd pain, UTI, ascites. Hx of NHL, hypothyroidism, A fib, anemia, OA, PE, SAH    PT Comments    Encouragement required for mobility and for pt to spend as much time OOB as possible. She continues to require +2 assist for mobility. O2 91% on 4L [place] with activity during session. Husband was present during session. He expresses concern about being able to manage pt care at current level. Pt continues to be adamant about returning home after d/c from hospital. Pt remains very weak and she fatigues easily/quickly with activity. She is at risk for falls. Continue to recommend SNF ([facility] if pt declines placement) but she has not been agreeable thus far.     Follow Up Recommendations  SNF      Equipment Recommendations  Wheelchair (measurements PT)    Recommendations for Other Services       Precautions / Restrictions Precautions Precautions: Fall Precaution Comments: monitor O2,HR Restrictions Weight Bearing Restrictions: No    Mobility  Bed Mobility Overal bed mobility: Needs Assistance Bed Mobility: Sidelying to Sit   Sidelying to sit: Mod assist       General bed mobility comments: Assist for trunk, LEs, and to scoot. Increased time. Cues for technique.  Transfers Overall transfer level: Needs assistance Equipment used: Rolling walker (2 wheeled) Transfers: Sit to/from Stand Sit to Stand: Mod assist;+2 physical assistance;+2 safety/equipment         General transfer comment: Sit to stand x 2 (3 attempts) to get to standing. VCs safety, technique, hand placement. Increased time.  Ambulation/Gait Ambulation/Gait assistance: Min assist;+2 physical assistance;+2 safety/equipment Gait Distance (Feet): 3 Feet Assistive device: Rolling walker (2 wheeled) Gait Pattern/deviations: Trunk  flexed     General Gait Details: Pt took a few steps over to recliner. O2 91% on 4L [place] with activity. Pt fatigues easily/quickly.   Stairs             Wheelchair Mobility    Modified Rankin (Stroke Patients Only)       Balance Overall balance assessment: Needs assistance         Standing balance support: Bilateral upper extremity supported Standing balance-Leahy Scale: Poor                              Cognition Arousal/Alertness: Awake/alert Behavior During Therapy: WFL for tasks assessed/performed Overall Cognitive Status: Impaired/Different from baseline Area of Impairment: Safety/judgement                     Memory: Decreased short-term memory   Safety/Judgement: Decreased awareness of safety   Problem Solving: Requires verbal cues;Requires tactile cues General Comments: pt continues to require encouragment to mobilize/spend time OOB      Exercises      General Comments        Pertinent Vitals/Pain Pain Assessment: No/denies pain    Home Living                      Prior Function            PT Goals (current goals can now be found in the care plan section) Progress towards PT goals: Progressing toward goals    Frequency    Min 3X/week      PT  Plan Current plan remains appropriate    Co-evaluation              AM-PAC PT "6 Clicks" Mobility   Outcome Measure  Help needed turning from your back to your side while in a flat bed without using bedrails?: A Little Help needed moving from lying on your back to sitting on the side of a flat bed without using bedrails?: A Lot Help needed moving to and from a bed to a chair (including a wheelchair)?: A Lot Help needed standing up from a chair using your arms (e.g., wheelchair or bedside chair)?: A Lot Help needed to walk in hospital room?: A Lot Help needed climbing 3-5 steps with a railing? : Total 6 Click Score: 12    End of Session Equipment  Utilized During Treatment: Oxygen;Gait belt Activity Tolerance: Patient limited by fatigue Patient left: in chair;with call bell/phone within reach;with chair alarm set;with family/visitor present   PT Visit Diagnosis: Muscle weakness (generalized) (M62.81);Difficulty in walking, not elsewhere classified (R26.2);Unsteadiness on feet (R26.81)     Time: 5953-9672 PT Time Calculation (min) (ACUTE ONLY): 23 min  Charges:  $Gait Training: 23-37 mins                         Doreatha Massed, PT Acute Rehabilitation  Office: [phone] Pager: [phone]

## 2020-07-15 NOTE — Plan of Care (Signed)

## 2020-07-16 ENCOUNTER — Inpatient Hospital Stay (HOSPITAL_COMMUNITY): Payer: Medicare PPO

## 2020-07-16 LAB — BASIC METABOLIC PANEL
Anion gap: 13 (ref 5–15)
BUN: 16 mg/dL (ref 8–23)
CO2: 37 mmol/L — ABNORMAL HIGH (ref 22–32)
Calcium: 7.2 mg/dL — ABNORMAL LOW (ref 8.9–10.3)
Chloride: 88 mmol/L — ABNORMAL LOW (ref 98–111)
Creatinine, Ser: 0.8 mg/dL (ref 0.44–1.00)
GFR calc Af Amer: >60 mL/min (ref >60)
GFR calc non Af Amer: >60 mL/min (ref >60)
Glucose, Bld: 104 mg/dL — ABNORMAL HIGH (ref 70–99)
Potassium: 2.5 mmol/L — CL (ref 3.5–5.1)
Sodium: 138 mmol/L (ref 135–145)

## 2020-07-16 LAB — CBC
HCT: 30.1 % — ABNORMAL LOW (ref 36.0–46.0)
Hemoglobin: 9.9 g/dL — ABNORMAL LOW (ref 12.0–15.0)
MCH: 33.8 pg (ref 26.0–34.0)
MCHC: 32.9 g/dL (ref 30.0–36.0)
MCV: 102.7 fL — ABNORMAL HIGH (ref 80.0–100.0)
Platelets: 152 10*3/uL (ref 150–400)
RBC: 2.93 MIL/uL — ABNORMAL LOW (ref 3.87–5.11)
RDW: 14.6 % (ref 11.5–15.5)
WBC: 5.2 10*3/uL (ref 4.0–10.5)
nRBC: 0 % (ref 0.0–0.2)

## 2020-07-16 LAB — MAGNESIUM: Magnesium: 1.5 mg/dL — ABNORMAL LOW (ref 1.7–2.4)

## 2020-07-16 MED ORDER — POTASSIUM CHLORIDE CRYS ER 20 MEQ PO TBCR
40.0000 meq | EXTENDED_RELEASE_TABLET | Freq: Two times a day (BID) | ORAL | Status: AC
  Administered 2020-07-16 – 2020-07-17 (×4): 40 meq via ORAL
  Filled 2020-07-16 (×4): qty 2

## 2020-07-16 MED ORDER — MAGNESIUM SULFATE 2 GM/50ML IV SOLN
2.0000 g | Freq: Once | INTRAVENOUS | Status: AC
  Administered 2020-07-16: 2 g via INTRAVENOUS
  Filled 2020-07-16: qty 50

## 2020-07-16 MED ORDER — POTASSIUM CHLORIDE 10 MEQ/100ML IV SOLN
10.0000 meq | INTRAVENOUS | Status: AC
  Administered 2020-07-16 (×3): 10 meq via INTRAVENOUS
  Filled 2020-07-16: qty 100

## 2020-07-16 NOTE — Plan of Care (Signed)

## 2020-07-16 NOTE — Progress Notes (Signed)
HEMATOLOGY-ONCOLOGY PROGRESS NOTE  SUBJECTIVE: She is alert this morning.  No complaint.  Oncology History  Non Hodgkin's lymphoma ([place])  09/29/2016 Initial Diagnosis   Non Hodgkin's lymphoma ([place])   03/22/2019 - 04/23/2019 Chemotherapy   The patient had palonosetron (ALOXI) injection 0.25 mg, 0.25 mg, Intravenous,  Once, 1 of 4 cycles Administration: 0.25 mg (03/22/2019) riTUXimab (RITUXAN) 800 mg in sodium chloride 0.9 % 250 mL (2.4242 mg/mL) infusion, 375 mg/m2 = 800 mg, Intravenous,  Once, 1 of 4 cycles Administration: 800 mg (03/22/2019) bendamustine (BENDEKA) 150 mg in sodium chloride 0.9 % 50 mL (2.6786 mg/mL) chemo infusion, 70 mg/m2 = 150 mg (100 % of original dose 70 mg/m2), Intravenous,  Once, 1 of 4 cycles Dose modification: 70 mg/m2 (original dose 70 mg/m2, Cycle 1, Reason: Provider Judgment) Administration: 150 mg (03/22/2019), 150 mg (03/23/2019)  for chemotherapy treatment.    05/18/2019 - 06/28/2019 Chemotherapy   The patient had DOXOrubicin (ADRIAMYCIN) chemo injection 82 mg, 40 mg/m2 = 82 mg (100 % of original dose 40 mg/m2), Intravenous,  Once, 2 of 6 cycles Dose modification: 40 mg/m2 (original dose 40 mg/m2, Cycle 1, Reason: Provider Judgment) Administration: 82 mg (05/18/2019), 82 mg (06/08/2019) palonosetron (ALOXI) injection 0.25 mg, 0.25 mg, Intravenous,  Once, 2 of 6 cycles Administration: 0.25 mg (05/18/2019), 0.25 mg (06/08/2019) pegfilgrastim (NEULASTA ONPRO KIT) injection 6 mg, 6 mg, Subcutaneous, Once, 2 of 6 cycles Administration: 6 mg (05/18/2019), 6 mg (06/08/2019) vinCRIStine (ONCOVIN) 1 mg in sodium chloride 0.9 % 50 mL chemo infusion, 1 mg (100 % of original dose 1 mg), Intravenous,  Once, 2 of 6 cycles Dose modification: 1 mg (original dose 1 mg, Cycle 1, Reason: Provider Judgment) Administration: 1 mg (05/18/2019), 1 mg (06/08/2019) riTUXimab (RITUXAN) 800 mg in sodium chloride 0.9 % 250 mL (2.4242 mg/mL) infusion, 375 mg/m2 = 800 mg, Intravenous,  Once, 2 of 6  cycles Administration: 800 mg (05/18/2019), 800 mg (06/08/2019) cyclophosphamide (CYTOXAN) 1,240 mg in sodium chloride 0.9 % 250 mL chemo infusion, 600 mg/m2 = 1,540 mg, Intravenous,  Once, 2 of 6 cycles Dose modification: 600 mg/m2 (original dose 750 mg/m2, Cycle 2, Reason: Provider Judgment) Administration: 1,240 mg (05/18/2019), 1,240 mg (06/08/2019) fosaprepitant (EMEND) 150 mg, dexamethasone (DECADRON) 12 mg in sodium chloride 0.9 % 145 mL IVPB, , Intravenous,  Once, 2 of 6 cycles Administration:  (05/18/2019),  (06/08/2019)  for chemotherapy treatment.    07/19/2019 -  Chemotherapy   The patient had riTUXimab (RITUXAN) 700 mg in sodium chloride 0.9 % 250 mL (2.1875 mg/mL) infusion, 375 mg/m2 = 700 mg, Intravenous,  Once, 11 of 11 cycles Administration: 700 mg (07/19/2019), 700 mg (08/16/2019), 700 mg (09/13/2019), 700 mg (10/10/2019), 700 mg (11/07/2019), 700 mg (12/07/2019), 700 mg (01/04/2020), 700 mg (02/01/2020), 700 mg (03/05/2020), 700 mg (04/02/2020), 700 mg (04/30/2020) riTUXimab-pvvr (RUXIENCE) 700 mg in sodium chloride 0.9 % 250 mL (2.1875 mg/mL) infusion, 375 mg/m2 = 700 mg (original dose ), Intravenous,  Once, 0 of 1 cycle Dose modification: 375 mg/m2 (Cycle 12)  for chemotherapy treatment.     PHYSICAL EXAMINATION:  Vitals:   07/15/20 2110 07/16/20 0614  BP: (!) 98/41 (!) 121/37  Pulse: 70 71  Resp: 20 22  Temp: 97.9 F (36.6 C) 98.5 F (36.9 C)  SpO2: 97% 96%   Filed Weights   07/12/20 0500 07/15/20 0500 07/16/20 0614  Weight: 156 lb 12 oz (71.1 kg) 144 lb 6.4 oz (65.5 kg) 151 lb 10.8 oz (68.8 kg)    Intake/Output from previous  day: 07/27 0701 - 07/28 0700 In: 300 [P.O.:300] Out: 500 [Urine:500]  GENERAL: Appears very weak, alert HEENT: No thrush, mouth is dry LYMPH: Soft mobile left cervical and left axillary nodes, stable today LUNGS: Increased respiratory rate, diminished breath sounds on the right HEART: Regular rate and rhythm ABDOMEN: Nontender, spleen tip  palpable in the left subcostal region SKIN: Multiple ecchymoses noted on her bilateral arms, legs, and trunk-improved NEURO: Alert and oriented x3 Vascular: No leg edema  LABORATORY DATA:  I have reviewed the data as listed CMP Latest Ref Rng & Units 07/16/2020 07/15/2020 07/14/2020  Glucose 70 - 99 mg/dL 104(H) 102(H) 107(H)  BUN 8 - 23 mg/dL _0 Creatinine 0.44 - 1.00 mg/dL 0.80 0.75 0.68  Sodium 135 - 145 mmol/L 138 138 136  Potassium 3.5 - 5.1 mmol/L 2.5(LL) 3.1(L) 2.8(L)  Chloride 98 - 111 mmol/L 88(L) 87(L) 84(L)  CO2 22 - 32 mmol/L 37(H) 40(H) 40(H)  Calcium 8.9 - 10.3 mg/dL 7.2(L) 7.2(L) 7.0(L)  Total Protein 6.5 - 8.1 g/dL - - -  Total Bilirubin 0.3 - 1.2 mg/dL - - -  Alkaline Phos 38 - 126 U/L - - -  AST 15 - 41 U/L - - -  ALT 0 - 44 U/L - - -    Lab Results  Component Value Date   WBC 5.2 07/16/2020   HGB 9.9 (L) 07/16/2020   HCT 30.1 (L) 07/16/2020   MCV 102.7 (H) 07/16/2020   PLT 152 07/16/2020   NEUTROABS 2.8 06/23/2020    DG Chest 1 View  Result Date: 07/08/2020 CLINICAL DATA:  86 year old female status post right thoracentesis. EXAM: CHEST  1 VIEW COMPARISON:  Chest radiograph dated 07/06/2020. FINDINGS: Right-sided Port-A-Cath in similar position. Bilateral patchy airspace opacities, right greater left with interval progression compared to prior radiograph may represent edema or pneumonia. Clinical correlation is recommended. Small bilateral pleural effusions. No pneumothorax. Stable cardiac silhouette. Atherosclerotic calcification of the aorta. No acute osseous pathology. IMPRESSION: 1. Small bilateral pleural effusions.  No pneumothorax. 2. Bilateral airspace opacities, progressed since the prior radiograph. Electronically Signed   By: Anner Crete M.D.   On: 07/08/2020 18:26   DG Chest 1 View  Result Date: 07/04/2020 CLINICAL DATA:  Status post right thoracentesis. EXAM: CHEST  1 VIEW COMPARISON:  Chest x-ray from yesterday. FINDINGS: Unchanged right  chest wall port catheter. The heart size and mediastinal contours are within normal limits. Normal pulmonary vascularity. Trace residual right pleural effusion status post thoracentesis. Improved aeration at the right lung base. No pneumothorax. Unchanged small left pleural effusion and basilar atelectasis. No acute osseous abnormality. IMPRESSION: 1. Trace residual right pleural effusion status post thoracentesis. No pneumothorax. 2. Unchanged small left pleural effusion and basilar atelectasis. Electronically Signed   By: Titus Dubin M.D.   On: 07/04/2020 11:42   DG Chest 2 View  Result Date: 07/14/2020 CLINICAL DATA:  Dyspnea. EXAM: CHEST - 2 VIEW COMPARISON:  07/09/2020 FINDINGS: Right chest wall port a catheter is noted with tip at the cavoatrial junction. Moderate right pleural effusion the with [place] opacification of the right lower lobe is again noted. When compared with the previous exam there is improved aeration to the right and left midlung zones and left lower lobe. IMPRESSION: 1. Improved aeration to the right and left lower lobes. 2. Persistent right pleural effusion. Electronically Signed   By: Kerby Moors M.D.   On: 07/14/2020 12:16   DG Chest 2 View  Result Date: 07/09/2020  CLINICAL DATA:  Follow-up pleural effusion. EXAM: CHEST - 2 VIEW COMPARISON:  07/08/20 FINDINGS: Right chest wall port a catheter is noted with tip projecting over the cavoatrial junction. Interval increase in volume of right pleural effusion. Mild diffuse pulmonary edema. Bilateral airspace opacities are noted within the left upper lobe, right midlung and right base concerning for multifocal pneumonia. IMPRESSION: 1. Increase in volume of right pleural effusion with mild interstitial edema 2. Bilateral airspace opacities concerning for multifocal pneumonia. Electronically Signed   By: Kerby Moors M.D.   On: 07/09/2020 11:21   DG Chest 2 View  Result Date: 07/06/2020 CLINICAL DATA:  Worsening shortness of  breath. EXAM: CHEST - 2 VIEW COMPARISON:  07/04/2020 FINDINGS: The cardiac silhouette remains grossly normal in size, spur partially obscured by significantly increased patchy opacity in the right lower lung zone. Small amount of posterior pleural fluid. Mild linear density at the left lung base. Right subclavian porta catheter tip in the region of the superior cavoatrial junction. Unremarkable bones. IMPRESSION: 1. Significantly increased right lower lung zone pneumonia. 2. Small amount of posterior pleural fluid. 3. Mild left basilar atelectasis. Electronically Signed   By: Claudie Revering M.D.   On: 07/06/2020 16:24   DG Chest 2 View  Result Date: 07/03/2020 CLINICAL DATA:  Dyspnea, pleural effusions EXAM: CHEST - 2 VIEW COMPARISON:  06/26/2020 FINDINGS: Frontal and lateral views of the chest demonstrates stable right chest wall port. Cardiac silhouette is unremarkable. There is now a moderate right pleural effusion with right basilar consolidation. Decreased left pleural effusion, with only trace fluid at the left costophrenic angle. Persistent retrocardiac consolidation. No pneumothorax. No acute bony abnormalities. IMPRESSION: 1. Decreased left pleural effusion, with persistent left basilar consolidation. 2. Increasing right pleural effusion, with increasing right basilar consolidation. Electronically Signed   By: Randa Ngo M.D.   On: 07/03/2020 16:11   DG Chest 2 View  Result Date: 06/26/2020 CLINICAL DATA:  Dyspnea. EXAM: CHEST - 2 VIEW COMPARISON:  Chest CT 05/22/2020 FINDINGS: Right chest port remains in place. Hazy opacity at the left lung base consistent with pleural effusion and airspace disease/atelectasis. Right lung is clear. Upper normal heart size. Mild right hilar prominence likely secondary to combination of vascular structures and adenopathy is seen on recent CT. Fullness in the upper mediastinum likely secondary to adenopathy. No pneumothorax. No pulmonary edema. Surgical clips in the  left axilla. IMPRESSION: 1. Hazy opacity at the left lung base consistent with pleural effusion and airspace disease/atelectasis. 2. Mild right hilar prominence likely secondary to combination of vascular structures and adenopathy seen on recent CT. Electronically Signed   By: Keith Rake M.D.   On: 06/26/2020 15:54   CT ANGIO CHEST PE W OR WO CONTRAST  Result Date: 07/07/2020 CLINICAL DATA:  Lymphoma, recurrent right pleural effusion bloody tap EXAM: CT ANGIOGRAPHY CHEST WITH CONTRAST TECHNIQUE: Multidetector CT imaging of the chest was performed using the standard protocol during bolus administration of intravenous contrast. Multiplanar CT image reconstructions and MIPs were obtained to evaluate the vascular anatomy. CONTRAST:  110m OMNIPAQUE IOHEXOL 350 MG/ML SOLN COMPARISON:  None. FINDINGS: Cardiovascular: Aortic atherosclerosis. Normal heart size. Central pulmonary arteries are patent. Mediastinum/Nodes: Adenopathy is identified in the supraclavicular region, bilateral axilla, and mediastinum. Nodes appear and measures slightly smaller in size. Lungs/Pleura: Moderate right and mild to moderate left pleural effusions measuring simple fluid in density. Imaging performed during expiration. Persistent atelectasis at the left lung base. New atelectasis/consolidation at the right lung base. Interlobular septal  thickening the non dependent right lung may reflect asymmetric edema. Upper Abdomen: Ascites. Unchanged ill-defined hypoattenuating lesion in the superior spleen. Musculoskeletal: No new abnormality. Review of the MIP images confirms the above findings. IMPRESSION: Moderate right and mild to moderate left pleural effusions. Right basilar atelectasis/consolidation and left basilar atelectasis. Interlobular septal thickening in the right lung may reflect asymmetric edema. Multifocal adenopathy appears stable to slightly improved. Electronically Signed   By: Macy Mis M.D.   On: 07/07/2020 14:33    CT Abdomen Pelvis W Contrast  Result Date: 06/23/2020 CLINICAL DATA:  86 year old female with abdominal pain, nausea and vomiting. History of lymphoma. EXAM: CT ABDOMEN AND PELVIS WITH CONTRAST TECHNIQUE: Multidetector CT imaging of the abdomen and pelvis was performed using the standard protocol following bolus administration of intravenous contrast. CONTRAST:  6m OMNIPAQUE IOHEXOL 300 MG/ML  SOLN COMPARISON:  CT of the chest abdomen pelvis dated 05/22/2020. FINDINGS: Lower chest: Partially visualized moderate left and trace right pleural effusions, new since the prior CT. There is associated partial compressive atelectasis of the left lower lobe. Pneumonia is not excluded. Clinical correlation is recommended. No intra-abdominal free air. Interval development of a small ascites, new or significantly increased since the prior CT. Hepatobiliary: A 1 cm hypodense focus along the posterior liver capsule is not characterized but may represent a cyst or focal area of scarring. This is similar to prior CT. Subcentimeter hypodense focus in the anterior liver is too small to characterize. There is mild intrahepatic biliary ductal dilatation, likely post cholecystectomy. The common bile duct is dilated measuring 14 mm. No retained calcified stone noted in the central CBD. Pancreas: The pancreas is unremarkable as visualized. Spleen: Mildly enlarged spleen measuring 15 cm in craniocaudal length. Adrenals/Urinary Tract: The adrenal glands are poorly visualized but grossly unremarkable. There is mild fullness of the renal collecting systems bilaterally without frank hydronephrosis. Mild hazy appearance of the urothelium noted. Correlation with urinalysis recommended to exclude UTI. There is symmetric enhancement and excretion of contrast by both kidneys. Subcentimeter left renal hypodense focus is not characterized. The urinary bladder is partially distended. Probable chronic bladder wall thickening and perivesical  stranding. Correlation with urinalysis recommended to exclude UTI. Stomach/Bowel: There is sigmoid diverticulosis without definite active inflammatory changes. There is postsurgical changes of bowel with anastomotic suture in the right lower quadrant. No definite evidence of bowel obstruction. Evaluation however is limited in the absence of oral contrast. Vascular/Lymphatic: There is moderate aortoiliac atherosclerotic disease. The IVC is unremarkable as visualized. The SMV, splenic vein, and main portal vein are patent. No portal venous gas. Bulky retroperitoneal adenopathy encasing the abdominal aorta and IVC as well as enlarged bilateral iliac chain lymph nodes in keeping with history of lymphoma. Overall the degree of adenopathy is relatively similar to prior CT. Reproductive: The uterus is poorly visualized. Other: Diffuse mesenteric edema, new since the prior CT. There is scattered mesenteric adenopathy as seen previously. Musculoskeletal: Degenerative changes of the spine. Minimal compression fracture of the anterior superior and inferior endplates of the L3, new since the prior CT, likely acute or subacute. IMPRESSION: 1. Interval development of partially visualized moderate left and trace right pleural effusions, as well as development of diffuse mesenteric edema and ascites, new since the prior CT. 2. Bulky retroperitoneal and iliac chain adenopathy in keeping with history of lymphoma. Overall the degree of adenopathy is relatively similar to prior CT. 3. Mild splenomegaly. 4. Minimal compression fracture of the superior and inferior endplate of L3, likely acute or  subacute. 5. Sigmoid diverticulosis. 6. Aortic Atherosclerosis (ICD10-I70.0). Electronically Signed   By: Anner Crete M.D.   On: 06/23/2020 16:39   DG CHEST PORT 1 VIEW  Result Date: 07/16/2020 CLINICAL DATA:  86 year old female with history of shortness of breath. Pleural effusion. EXAM: PORTABLE CHEST 1 VIEW COMPARISON:  Chest x-ray  07/14/2020. FINDINGS: Right subclavian single-lumen porta cath with tip terminating at the superior cavoatrial junction. Enlarging large right pleural effusion with extensive passive atelectasis in the right mid to lower lung. Left lung is clear. No left pleural effusion. No pneumothorax. No evidence of pulmonary edema. Heart size appears normal. Aortic atherosclerosis. Surgical clips project over the left axillary region, presumably from prior left axillary nodal dissection. IMPRESSION: 1. Enlarging right pleural effusion with worsening passive atelectasis in the right lung. 2. Aortic atherosclerosis. Electronically Signed   By: Vinnie Langton M.D.   On: 07/16/2020 10:17   ECHOCARDIOGRAM COMPLETE  Result Date: 07/07/2020    ECHOCARDIOGRAM REPORT   Patient Name:   SERRIA SLOMA Date of Exam: 07/07/2020 Medical Rec #:  [id number]            Height:       68.0 in Accession #:    [id number]           Weight:       174.0 lb Date of Birth:  09-04-1934           BSA:          1.926 m Patient Age:    67 years             BP:           132/65 mmHg Patient Gender: F                    HR:           855 bpm. Exam Location:  Inpatient Procedure: 2D Echo Indications:    Atrial tachycardia [facility]) [[id number]]  History:        Patient has prior history of Echocardiogram examinations, most                 recent 05/31/2019. Risk Factors:Hypertension. Lymphoma , thyroid                 disease, thyroid cancer. Past history of afib.  Sonographer:    Darlina Sicilian RDCS Referring Phys: [place]  1. Left ventricular ejection fraction, by estimation, is 60 to 65%. The left ventricle has normal function. The left ventricle has no regional wall motion abnormalities. Left ventricular diastolic parameters were normal.  2. Right ventricular systolic function is normal. The right ventricular size is normal.  3. Moderate pleural effusion in both left and right lateral regions.  4. The mitral valve is normal in  structure. Trivial mitral valve regurgitation. No evidence of mitral stenosis.  5. The aortic valve is tricuspid. Aortic valve regurgitation is not visualized. No aortic stenosis is present.  6. The inferior vena cava is normal in size with <50% respiratory variability, suggesting right atrial pressure of 8 mmHg. Conclusion(s)/Recommendation(s): Normal biventricular function without evidence of hemodynamically significant valvular heart disease. FINDINGS  Left Ventricle: Left ventricular ejection fraction, by estimation, is 60 to 65%. The left ventricle has normal function. The left ventricle has no regional wall motion abnormalities. The left ventricular internal cavity size was normal in size. There is  no left ventricular hypertrophy. Left ventricular diastolic parameters were normal. Right Ventricle:  The right ventricular size is normal. No increase in right ventricular wall thickness. Right ventricular systolic function is normal. Left Atrium: Left atrial size was normal in size. Right Atrium: Right atrial size was normal in size. Pericardium: Trivial pericardial effusion is present. Mitral Valve: The mitral valve is normal in structure. Trivial mitral valve regurgitation. No evidence of mitral valve stenosis. Tricuspid Valve: The tricuspid valve is normal in structure. Tricuspid valve regurgitation is mild . No evidence of tricuspid stenosis. Aortic Valve: The aortic valve is tricuspid. Aortic valve regurgitation is not visualized. No aortic stenosis is present. Pulmonic Valve: The pulmonic valve was grossly normal. Pulmonic valve regurgitation is trivial. Aorta: The aortic root, ascending aorta and aortic arch are all structurally normal, with no evidence of dilitation or obstruction. Venous: The inferior vena cava is normal in size with less than 50% respiratory variability, suggesting right atrial pressure of 8 mmHg. IAS/Shunts: No atrial level shunt detected by color flow Doppler. Additional Comments:  There is a moderate pleural effusion in both left and right lateral regions.  LEFT VENTRICLE PLAX 2D LVIDd:         5.10 cm  Diastology LVIDs:         3.30 cm  LV e' lateral:   8.27 cm/s LV PW:         0.90 cm  LV E/e' lateral: 9.2 LV IVS:        0.70 cm  LV e' medial:    6.64 cm/s LVOT diam:     1.80 cm  LV E/e' medial:  11.5 LV SV:         30 LV SV Index:   15 LVOT Area:     2.54 cm  RIGHT VENTRICLE RV S prime:     14.80 cm/s TAPSE (M-mode): 2.4 cm LEFT ATRIUM           Index       RIGHT ATRIUM           Index LA diam:      3.00 cm 1.56 cm/m  RA Area:     13.80 cm LA Vol (A2C): 44.7 ml 23.21 ml/m RA Volume:   33.50 ml  17.39 ml/m LA Vol (A4C): 42.6 ml 22.12 ml/m  AORTIC VALVE LVOT Vmax:   77.70 cm/s LVOT Vmean:  49.700 cm/s LVOT VTI:    0.116 m  AORTA Ao Root diam: 3.00 cm MITRAL VALVE MV Area (PHT): 4.96 cm    SHUNTS MV Decel Time: 153 msec    Systemic VTI:  0.12 m MV E velocity: 76.30 cm/s  Systemic Diam: 1.80 cm MV A velocity: 78.80 cm/s MV E/A ratio:  0.97 Buford Dresser MD Electronically signed by Buford Dresser MD Signature Date/Time: 07/07/2020/5:02:22 PM    Final    [place] ASCITES (ABDOMEN LIMITED)  Result Date: 07/01/2020 CLINICAL DATA:  History of lymphoma, now with concern for symptomatic intra-abdominal ascites. Please perform ascites search ultrasound and ultrasound-guided paracentesis as indicated. EXAM: LIMITED ABDOMEN ULTRASOUND FOR ASCITES TECHNIQUE: Limited ultrasound survey for ascites was performed in all four abdominal quadrants. COMPARISON:  CT abdomen pelvis-06/23/2020 FINDINGS: Sonographic evaluation demonstrates a trace amount of intra-abdominal ascites, too small to allow for safe ultrasound-guided paracentesis. Note is made of small bilateral pleural effusions, the right side of which appears new compared to abdominal CT performed 06/23/2020. IMPRESSION: 1. Trace amount of intra-abdominal ascites, too small to allow for safe ultrasound-guided paracentesis. No  paracentesis attempted. 2. Small bilateral pleural effusions - the left-sided pleural effusion  appears similar to abdominal CT performed 06/23/2020 however the right-sided pleural effusion appears new of the CT. Electronically Signed   By: Sandi Mariscal M.D.   On: 07/01/2020 15:11   US Abdomen Limited RUQ  Result Date: 07/02/2020 CLINICAL DATA:  Abnormal LFTs EXAM: ULTRASOUND ABDOMEN LIMITED RIGHT UPPER QUADRANT COMPARISON:  CT 06/23/2020.  Ultrasound 07/01/2020 FINDINGS: Gallbladder: Prior cholecystectomy Common bile duct: Diameter: Prominent measuring up to 10 mm, likely related to post cholecystectomy state. Liver: Heterogeneous, slightly increased echotexture. Subtle nodular contours to the liver surface. Findings raise the possibility of cirrhosis. No suspicious focal hepatic abnormality. Portal vein is patent on color Doppler imaging with normal direction of blood flow towards the liver. Other: Ascites and right effusion noted. Prominent porta hepatis lymph nodes. IMPRESSION: Heterogeneous, increased echotexture throughout the liver. Subtle nodular contour seen in some areas of the liver. Findings suggest the possibility of cirrhosis. Perihepatic ascites and right pleural effusion. Mild biliary ductal dilatation, likely related to post cholecystectomy state. Prominent porta hepatis lymph nodes, likely related to liver disease. Electronically Signed   By: Rolm Baptise M.D.   On: 07/02/2020 08:52   US THORACENTESIS ASP PLEURAL SPACE W/IMG GUIDE  Result Date: 07/08/2020 INDICATION: Lymphoma, remote colon and thyroid cancers, dyspnea, right pleural effusion.request for diagnostic and therapeutic thoracentesis. EXAM: ULTRASOUND GUIDED RIGHT THORACENTESIS MEDICATIONS: 1% lidocaine 10 mL COMPLICATIONS: None immediate. PROCEDURE: An ultrasound guided thoracentesis was thoroughly discussed with the patient and questions answered. The benefits, risks, alternatives and complications were also discussed. The patient  understands and wishes to proceed with the procedure. Written consent was obtained. Ultrasound was performed to localize and mark an adequate pocket of fluid in the right chest. The area was then prepped and draped in the normal sterile fashion. 1% Lidocaine was used for local anesthesia. Under ultrasound guidance a 6 Fr Safe-T-Centesis catheter was introduced. Thoracentesis was performed. The catheter was removed and a dressing applied. FINDINGS: A total of approximately 500 mL of red fluid was removed. Samples were sent to the laboratory as requested by the clinical team. IMPRESSION: Successful ultrasound guided right thoracentesis yielding 500 mL of pleural fluid. Read by: Gareth Eagle, PA-C Electronically Signed   By: Jerilynn Mages.  Shick M.D.   On: 07/08/2020 16:31   US THORACENTESIS ASP PLEURAL SPACE W/IMG GUIDE  Result Date: 07/04/2020 INDICATION: Patient with history of lymphoma, remote colon and thyroid cancers, dyspnea, right pleural effusion. Request made for diagnostic and therapeutic right thoracentesis. EXAM: ULTRASOUND GUIDED DIAGNOSTIC AND THERAPEUTIC RIGHT THORACENTESIS MEDICATIONS: 1% lidocaine to skin and subcutaneous tissue COMPLICATIONS: None immediate. PROCEDURE: An ultrasound guided thoracentesis was thoroughly discussed with the patient and questions answered. The benefits, risks, alternatives and complications were also discussed. The patient understands and wishes to proceed with the procedure. Written consent was obtained. Ultrasound was performed to localize and mark an adequate pocket of fluid in the right chest. The area was then prepped and draped in the normal sterile fashion. 1% Lidocaine was used for local anesthesia. Under ultrasound guidance a 6 Fr Safe-T-Centesis catheter was introduced. Thoracentesis was performed. The catheter was removed and a dressing applied. FINDINGS: A total of approximately 1.6 liters of blood-tinged fluid was removed. Samples were sent to the laboratory as  requested by the clinical team. IMPRESSION: Successful ultrasound guided diagnostic and therapeutic right thoracentesis yielding 1.6 liters of pleural fluid. Read by: Rowe Robert, PA-C Electronically Signed   By: Lucrezia Europe M.D.   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 Status post weekly Rituxan x4 03/01/2012 through 03/22/2012. She completed 4 "maintenance" doses of Rituxan, last on 12/19/2012. A restaging CT on 02/09/2013 showed no evidence of lymphoma.   Lymph node lateral to the thyroid bed on a neck ultrasound 02/21/2014, status post an FNA biopsy concerning for a lymphoproliferative disorder.  PET scan 09/28/2016 with active lymphoma within the neck, chest, abdomen, pelvis; splenic enlargement and hypermetabolism suspicious for splenic involvement.  Initiation of Rituxan weekly 4 09/29/2016  Initiation of maintenance Rituxan on a 3 month schedule 12/23/2016; final Rituxan 08/31/2018  Thyroid ultrasound 02/07/2019-left cervical lymphadenopathy  PET scan 03/08/2019-extensive recurrent hypermetabolic lymphoma involving the neck, chest, abdomen and pelvis.  03/16/2019 left cervical lymph node biopsy-features consistent with previously diagnosed non-Hodgkin's B-cell lymphoma, phenotypically consistent with marginal zone lymphoma. Flow cytometry with lambda restricted B-cell population without expression of CD5 or CD10 comprising 87% of all lymphocytes.  Cycle 1 bendamustine/Rituxan 03/22/2019  Excision deep left axillary lymph nodes 05/04/2019-non-Hodgkin's B-cell lymphoma with differential including a marginal zone lymphoma and atypical small lymphocytic lymphoma. Flow cytometry with monoclonal B-cell population without expression of CD5 or CD10, comprises 96% of all lymphocytes.  Cycle 1 [facility]/Rituxan 05/18/2019  Cycle 2 [facility]/Rituxan  06/08/2019  CTs 06/15/2019-partial improvement in diffuse adenopathy, stable mild splenomegaly  Cycle 1 Revlimid/rituximab 07/19/2019 (Revlimid start 07/20/2019)  Cycle 2 Revlimid/rituximab 08/16/2019 (Revlimid placed on hold 08/30/2019 due to neutropenia)  Cycle 3 of Revlimid/rituximab 09/13/2019 (Revlimid schedule changed to 14 days on/14 days off)  Cycle 4 Revlimid/rituximab 10/10/2019  Cycle 5 Revlimid/rituximab 11/07/2019  Cycle 6 Revlimid/rituximab 12/07/2019  CTs 12/28/2019-diffuse lymphadenopathy-slightly increased compared to 06/15/2019  Cycle 7 Revlimid/rituximab 01/04/2020  Cycle 8 Revlimid/Rituxan 02/01/2020  Cycle 9 Revlimid/Rituxan 03/05/2020  Cycle 10 Revlimid/Rituxan 04/02/2020  Cycle 11 Revlimid/Rituxan 04/30/2020  CT 05/22/2020-mild increase in left supraclavicular adenopathy, mild increase in chest, retroperitoneal, and pelvic adenopathy. Stable mild splenomegaly.  Acalabrutinib started 06/19/2020  CT abdomen/pelvis 06/23/2020-moderate left and trace right pleural effusions, new diffuse mesenteric edema and ascites, stable retroperitoneal and iliac adenopathy, mild splenomegaly 2. Stage IV (T1bN1b M0) papillary thyroid cancer, status post a thyroidectomy with reimplantation of the left superior parathyroid gland on 05/23/2012, status post radioactive iodine therapy, followed by Dr. Buddy Duty.  3. Stage II (T3 N0) colon cancer, status post a right colectomy 10/19/2011, last colonoscopy April 2015-sigmoid adenoma removed.  4. History of a pulmonary embolism December 2012.  5. History of Atrial fibrillation 6. Iron deficiency anemia-new 03/18/2014. Hemoccult positive stool. The anemia corrected with iron. No longer taking iron.  Status post an upper endoscopy and colonoscopy by Dr. Carlean Purl April 2015 with no bleeding source identified, benign adenoma removed from the sigmoid colon. 7. Report of an upper gastric intestinal bleed fall 2016-managed in [place]. [name] 8. Left knee  replacement May 2017, repeat left knee surgery May 2018 9. Pruritic rash 07/22/2016 10. Nausea and diarrhea 04/02/2019-stool negative for C. difficile toxin 11. 06/18/2019 [facility] admission for symptomatic anemia. 12.  Admission 06/23/2020 with nausea and diarrhea 13.  Respiratory failure, bilateral pleural effusions  Right thoracentesis 07/04/2020-culture and cytology negative  CT chest 07/07/2020-right and left pleural effusions, bilateral basilar atelectasis, interlobular septal thickening in the right lung-asymmetric edema?  Improved adenopathy  Right thoracentesis 07/08/2020-flow cytometry with a small clonal B-cell population  Ms. Minardi continues to have dyspnea and generalized weakness.  Chest x-ray today shows reaccumulation of the right pleural effusion.  It is unclear whether  the effusion is related to lymphoma, infection, or another etiology.  I doubt she is benefiting from continued diuresis.  I will ask pulmonary medicine to see her to consider the etiology of the respiratory failure and effusions.  She will likely need a repeat thoracentesis and potentially a Pleurx catheter.  She is not ready for discharge to home or a skilled nursing facility at present.  I updated her husband by telephone.  He is in agreement with skilled nursing facility placement if needed.  There is a facility in [facility] that he prefers.   Recommendations: 1.  Continue to hold acalabrutinib 2.  Continue to attempt to wean O2 as tolerated. 3.  Pulmonary medicine consultation 4.  Repeat right thoracentesis if pulmonary medicine agrees   LOS: 22 days   Betsy Coder 07/16/20

## 2020-07-16 NOTE — Progress Notes (Signed)
CRITICAL VALUE ALERT  Critical Value:  Potassium 2.5  Date & Time Notied: 07/16/20 @ 1464  Provider Notified: M. Sharlet Salina @ [phone]  Orders Received/Actions taken: Awaiting orders

## 2020-07-16 NOTE — Progress Notes (Signed)
PROGRESS NOTE    Tracy Mclaughlin  IDP:[id number] DOB: 07/03/1934 DOA: 06/23/2020 PCP: [name], PA    Brief Narrative:  This patient is 86 y.o.femalewith medical history significant forlow-grade lymphoma without significant response to chemotherapy last year and was recently started onBTK INHIBITORby Dr. [name], hypothyroidism, history of A. fib, pancytopenia presents to the ED with3-4 day history of nausea vomiting and diarrhea and abdominal pain-and right upper quadrant pain.symptoms started after new chemo on Thursday.Patient denies any fever,  chest pain.vomitted x 3 today and had non bloody diarrhea 4 times today.  ED Course:Hemodynamically stable, afebrile, UA positive for UTI, CT scan abdomen done that showed free fluid with ascites and mesenteric edema, case was discussed Dr. [name] from oncology,  advised UTI treatment with Zosyn .  Patient  was admitted, being managed symptomatically, chemo was held,  diarrhea improved subsequently,  oncology put the patient back on chemo overall tolerating well. Having nausea anorexia suspecting this is related to her lymphoma. Patient has been started on prednisone to help with appetite and also received trazodone for sleep. Patient remains deconditioned.  She was found to get more sleepy and confused subsequently trazodone and prednisone were discontinued with resolution of her confusion. Patient was also noticed to have shortness of breath/dyspnea with activity-imaging studies showed pleural effusion underwent right-sided thoracentesis with 1.6 L of blood-tinged fluid removal, cytology reactive medically stable, no growth on culture. PT had suggested skilled nursing facility,  patient had PICC line, plan:  SNF as well as home health.  Patient went into rapid tachycardia supraventricular 7/19 a.m. and also with hypoxia-underwent CTA chest no PE but right-sided pleural effusion atelectasis/consolidation-cardiologist consulted.   Patient converted to sinus rhythm spontaneously. received few days of IV antibiotics , Procalcitonin has been negative/normal- no fever no leukocytosis. Patient continues to remain hypoxic and in respiratory failure, plan : may need thoracocentesis.flow cytometry pending.   Assessment & Plan:   Principal Problem:   Recurrent pleural effusion on right Active Problems:   Non Hodgkin's lymphoma (HCC)   Hypertension   Anemia, iron deficiency   PAF (paroxysmal atrial fibrillation) (HCC)   Anemia   Pancytopenia (HCC)   Nausea & vomiting   UTI (urinary tract infection)   Ascites   Acute respiratory failure with hypoxia and hypercapnia (HCC)  Acute hypoxic respiratory failure due to pleural effusion / No PE on the CT. -Stable, still requiring 4 L of oxygen, to maintain O2 sat greater than 92% -Currently satting 97% -Pleural cultures are growing gram-positive cocci - on  broad-spectrum antibiotics  Vanco and Zosyn  -we will modify and narrow down antibiotics according to final cultures and sensitivity. -Pleural fluid grew staph epi, we will continue with vancomycin and DC Zosyn.  -Remains in respiratory distress,  O2 dependent, hypoxic,  worsening shortness of breath with minimal exertion - Recurrent pleural effusion -07/08/2020 US guidedrightthoracentesis. Yielded500 mLof redfluid. CXR: 1. Increase in volume of right pleural effusion with mild interstitial edema 2. Bilateral airspace opacities concerning for multifocal pneumonia.  -Continue gentle diuretics -As needed duo nebs  -We will continue empiric antibiotics -Low suspicion for pneumonia, sepsis. -Patient needs repeat thoracocentesis for cytology  Intractable nausea vomitingdiarrhea andabdominal pain: -Resolved -Unclear etiology suspecting due to [place] held on admission and resumed 7/8. Diarrhea improved.   Acalabrutinib  was again discontinued as her LFTs were worsening. Has new onset ascites on  the CT scan-but too small to tap on the ultrasound . Cont supportive measure.  - Prednisone  was added to stimulate appetite but discontinued due to confusion  Recurrent pleural effusion: -Likely from  Lymphoma Vs infection or 2/2 Acalabrutinib ( less likely per oncology) . S/p rt thoracentesis 1.6 L blood-tinged fluid was removed.  Pleural fluid Gram stain no organism, culture no growth so far, cytology reactive mesothelial cell.   CTA shows moderate right and mild to moderate left pleural effusions-BNP around 400  -Echo shows preserved EJF 60 to 65%.  no diastolic dysfunction, unclear if related to CHF versus low Albumin.   Cardiology on board will start trial of IV Lasix low-dose, I's and O's: +10.1 L  - ordered US guided thoracentesis along with labs/cytology, discussed with cardio and  Oncology.  Narrow complex tachycardia - Resolved.  Converted to sinus rhythm.  No recurrent episode -remained stable- Cardiology following closely -Continue diltiazem CD 180 nightly, Lopressor 12.5 mg p.o. twice daily, -Following thyroid function -2D echocardiogram reviewed.  Ascites: -new onset ascites on the CT scan-too small to tap on paracentesis per IR.  Abnormal LFTS: -Improving 2/2 Acalabrutinib-which is discontinued.  LFTs improving. RUQ [place]- possible cirrhotic changes, hepatitis panel negative.   Acute metabolic encephalopathy from polypharmacy: -Mentation back to baseline  had  confusion/Lethargy: 7/14 a.m likley polypharmacy and pt feels improved after stopping trazodone/prednisone.   -Continue to complain of insomnia, sleeping medication as needed  She is on Xanax low-dose continue for anxiety.  Continue supportive care.   E coli UTI POA: Completed 7 days of antibiotics.   Non Hodgkin's lymphoma -Remained stable, -Oncology following -with progressive decline, patientwasstartedpast weekonAcalabrutinib she did not have significant response to chemotherapy last  year.Acalabrutinibwas held on admission and resumed  But again stopped due to worsening LFTs by oncology.   Failure to thrive/deconditioning: Remains weak and deconditioned.  -PT continues to recommend SNF -She refused SNF.  She is high risk for readmission.  Husband concerned about poor support.  Hypertension: BP is controlled.  Continue Cardizem.    Pancytopenia with anemia/Thrombocytopenia in the setting of lymphoma/chemo: -Hemoglobin and WBC count overall stable.  Monitor. Heparin was discontinued by hematology due to bilateral lower leg bruise and thrombocytopenia.    History of intermittent atrial fibrillation on Cardizem: - Not on anticoagulation due to history of subarachnoid hemorrhage as per cardiology. Bruise on the skin heparin discontinued per oncology. improving.   DVT prophylaxis: Place and maintain sequential compression device Start: 06/30/20 0901 Heparin stopped due to  extensive LE bruise 7/12 and low platelet counts by hematology. Code Status: DNR Family Communication: Discussed with patient today.    Status is: Inpatient Remains inpatient appropriate because: Patient remains deconditioned weak, hypoxic.  Dispo: The patient is from: Home   Anticipated d/c is to: SNF as per PT- pt declines SNF and HH.  Continued to engage on placement recomendations.  Anticipated d/c date is: 2 days.  Once shortness is better, and cleared by her oncologist.discussed with her oncology   Patient currently not medically stable.   Consultants:   Hematology, Pulmonology  Procedures: Right thoracocentesis  Antimicrobials: Anti-infectives (From admission, onward)   Start     Dose/Rate Route Frequency Ordered Stop   07/10/20 1600  vancomycin (VANCOREADY) IVPB 750 mg/150 mL     Discontinue     750 mg 150 mL/hr over 60 Minutes Intravenous Every 12 hours 07/10/20 1332     07/10/20 1430  piperacillin-tazobactam (ZOSYN) IVPB 3.375 g  Status:   Discontinued        3.375 g 12.5 mL/hr over 240 Minutes Intravenous Every  8 hours 07/10/20 1332 07/15/20 1333   07/06/20 1800  cefTRIAXone (ROCEPHIN) 1 g in sodium chloride 0.9 % 100 mL IVPB  Status:  Discontinued        1 g 200 mL/hr over 30 Minutes Intravenous Every 24 hours 07/06/20 1646 07/08/20 1037   07/06/20 1800  azithromycin (ZITHROMAX) tablet 500 mg  Status:  Discontinued        500 mg Oral Daily 07/06/20 1646 07/08/20 1037   06/27/20 0830  cefTRIAXone (ROCEPHIN) 1 g in sodium chloride 0.9 % 100 mL IVPB  Status:  Discontinued        1 g 200 mL/hr over 30 Minutes Intravenous Every 24 hours 06/27/20 0733 06/30/20 1340   06/24/20 0200  piperacillin-tazobactam (ZOSYN) IVPB 3.375 g  Status:  Discontinued        3.375 g 12.5 mL/hr over 240 Minutes Intravenous Every 8 hours 06/23/20 1703 06/27/20 0733   06/23/20 1645  piperacillin-tazobactam (ZOSYN) IVPB 3.375 g        3.375 g 100 mL/hr over 30 Minutes Intravenous  Once 06/23/20 1638 06/23/20 1834   06/23/20 1615  cefTRIAXone (ROCEPHIN) 1 g in sodium chloride 0.9 % 100 mL IVPB  Status:  Discontinued        1 g 200 mL/hr over 30 Minutes Intravenous  Once 06/23/20 1607 06/23/20 1608     Subjective: Patient was seen and examined at bedside.  No overnight events.  She appears comfortable but continues to have shortness of breath on exertion.  she is scheduled to have a thoracocentesis.  Patient refuses to be discharged to skilled nursing facility.   Objective: Vitals:   07/15/20 0846 07/15/20 1500 07/15/20 2110 07/16/20 0614  BP: (!) 129/43 (!) 111/43 (!) 98/41 (!) 121/37  Pulse: 64 73 70 71  Resp:  16 20 22   Temp:  97.7 F (36.5 C) 97.9 F (36.6 C) 98.5 F (36.9 C)  TempSrc:  Oral Oral Oral  SpO2:  98% 97% 96%  Weight:    68.8 kg  Height:        Intake/Output Summary (Last 24 hours) at 07/16/2020 1511 Last data filed at 07/16/2020 0900 Gross per 24 hour  Intake 180 ml  Output 500 ml  Net -320 ml   Filed Weights    07/12/20 0500 07/15/20 0500 07/16/20 0614  Weight: 71.1 kg 65.5 kg 68.8 kg    Examination:  General exam: Appears calm and comfortable  Respiratory system: Clear to auscultation. Respiratory effort normal.  Porta catheter on the chest. Cardiovascular system: S1 & S2 heard, RRR. No JVD, murmurs, rubs, gallops or clicks. No pedal edema. Gastrointestinal system: Abdomen is nondistended, soft and nontender. No organomegaly or masses felt. Normal bowel sounds heard. Central nervous system: Alert and oriented. No focal neurological deficits. Extremities:  No pain, Bruising noted on LE Skin: No rashes, lesions or ulcers Psychiatry: Judgement and insight appear normal. Mood & affect appropriate.     Data Reviewed: I have personally reviewed following labs and imaging studies  CBC: Recent Labs  Lab 07/12/20 0400 07/13/20 0442 07/14/20 0416 07/15/20 0408 07/16/20 0543  WBC 4.5 4.6 4.8 5.7 5.2  HGB 9.7* 9.9* 10.4* 10.4* 9.9*  HCT 30.7* 31.4* 32.6* 32.2* 30.1*  MCV 106.6* 106.1* 106.5* 104.9* 102.7*  PLT 126* 123* 128* 152 852   Basic Metabolic Panel: Recent Labs  Lab 07/12/20 0400 07/13/20 0442 07/14/20 0416 07/15/20 0408 07/16/20 0543  NA 142 140 136 138 138  K 3.0* 2.9* 2.8* 3.1* 2.5*  CL 92* 87* 84* 87* 88*  CO2 41* 40* 40* 40* 37*  GLUCOSE 108* 106* 107* 102* 104*  BUN 16 16 14 13 16   CREATININE 0.73 0.69 0.68 0.75 0.80  CALCIUM 7.3* 7.0* 7.0* 7.2* 7.2*  MG  --  1.4*  --   --  1.5*  PHOS  --  3.8  --   --   --    GFR: Estimated Creatinine Clearance: 53.7 mL/min (by C-G formula based on SCr of 0.8 mg/dL). Liver Function Tests: No results for input(s): AST, ALT, ALKPHOS, BILITOT, PROT, ALBUMIN in the last 168 hours. No results for input(s): LIPASE, AMYLASE in the last 168 hours. No results for input(s): AMMONIA in the last 168 hours. Coagulation Profile: No results for input(s): INR, PROTIME in the last 168 hours. Cardiac Enzymes: No results for input(s): CKTOTAL,  CKMB, CKMBINDEX, TROPONINI in the last 168 hours. BNP (last 3 results) No results for input(s): PROBNP in the last 8760 hours. HbA1C: No results for input(s): HGBA1C in the last 72 hours. CBG: Recent Labs  Lab 07/09/20 1632  GLUCAP 100*   Lipid Profile: No results for input(s): CHOL, HDL, LDLCALC, TRIG, CHOLHDL, LDLDIRECT in the last 72 hours. Thyroid Function Tests: No results for input(s): TSH, T4TOTAL, FREET4, T3FREE, THYROIDAB in the last 72 hours. Anemia Panel: No results for input(s): VITAMINB12, FOLATE, FERRITIN, TIBC, IRON, RETICCTPCT in the last 72 hours. Sepsis Labs: Recent Labs  Lab 07/09/20 1559  PROCALCITON <0.10    Recent Results (from the past 240 hour(s))  Gram stain     Status: None   Collection Time: 07/08/20  5:03 PM   Specimen: Fluid  Result Value Ref Range Status   Specimen Description FLUID PLEURAL  Final   Special Requests   Final    BOTTLES DRAWN AEROBIC AND ANAEROBIC Blood Culture adequate volume   Gram Stain   Final    RARE WBC PRESENT, PREDOMINANTLY MONONUCLEAR NO ORGANISMS SEEN Performed at [facility], [place] [address], [place], [place] [address]    Report Status 07/08/2020 FINAL  Final  Culture, body fluid-bottle     Status: Abnormal   Collection Time: 07/08/20  5:04 PM   Specimen: Fluid  Result Value Ref Range Status   Specimen Description FLUID PLEURAL  Final   Special Requests   Final    BOTTLES DRAWN AEROBIC AND ANAEROBIC Blood Culture adequate volume   Gram Stain   Final    GRAM POSITIVE COCCI IN CLUSTERS AEROBIC BOTTLE ONLY CRITICAL RESULT CALLED TO, READ BACK BY AND VERIFIED WITH: RN RUTH M. [id number] FCP Performed at [facility], [address] [place], [place] [address]    Culture STAPHYLOCOCCUS EPIDERMIDIS (A)  Final   Report Status 07/12/2020 FINAL  Final   Organism ID, Bacteria STAPHYLOCOCCUS EPIDERMIDIS  Final      Susceptibility   Staphylococcus epidermidis - MIC*    CIPROFLOXACIN 2 INTERMEDIATE  Intermediate     ERYTHROMYCIN >=8 RESISTANT Resistant     GENTAMICIN 2 SENSITIVE Sensitive     OXACILLIN >=4 RESISTANT Resistant     TETRACYCLINE >=16 RESISTANT Resistant     VANCOMYCIN 2 SENSITIVE Sensitive     TRIMETH/SULFA 20 SENSITIVE Sensitive     CLINDAMYCIN <=0.25 SENSITIVE Sensitive     RIFAMPIN <=0.5 SENSITIVE Sensitive     Inducible Clindamycin NEGATIVE Sensitive     * STAPHYLOCOCCUS EPIDERMIDIS     Radiology Studies: DG CHEST PORT 1 VIEW  Result Date: 07/16/2020 CLINICAL  DATA:  86 year old female with history of shortness of breath. Pleural effusion. EXAM: PORTABLE CHEST 1 VIEW COMPARISON:  Chest x-ray 07/14/2020. FINDINGS: Right subclavian single-lumen porta cath with tip terminating at the superior cavoatrial junction. Enlarging large right pleural effusion with extensive passive atelectasis in the right mid to lower lung. Left lung is clear. No left pleural effusion. No pneumothorax. No evidence of pulmonary edema. Heart size appears normal. Aortic atherosclerosis. Surgical clips project over the left axillary region, presumably from prior left axillary nodal dissection. IMPRESSION: 1. Enlarging right pleural effusion with worsening passive atelectasis in the right lung. 2. Aortic atherosclerosis. Electronically Signed   By: Vinnie Langton M.D.   On: 07/16/2020 10:17    Scheduled Meds: . Chlorhexidine Gluconate Cloth  6 each Topical Daily  . citalopram  20 mg Oral Daily  . diltiazem  180 mg Oral QHS  . feeding supplement (KATE FARMS STANDARD 1.4)  325 mL Oral Daily  . feeding supplement (PRO-STAT SUGAR FREE 64)  30 mL Oral Daily  . furosemide  20 mg Intravenous BID  . heparin lock flush  500 Units Intracatheter Q30 days  . levothyroxine  224 mcg Oral Q0600  . melatonin  3 mg Oral QHS  . metoprolol tartrate  12.5 mg Oral BID  . multivitamin with minerals  1 tablet Oral Daily  . pantoprazole  40 mg Oral Daily  . potassium chloride  40 mEq Oral BID   Continuous  Infusions: . sodium chloride 10 mL/hr at 07/07/20 0945  . vancomycin 750 mg (07/16/20 0513)     LOS: 22 days    Time spent: 25 mins.    Shawna Clamp, MD [facility]   If 7PM-7AM, please contact night-coverage

## 2020-07-16 NOTE — Evaluation (Signed)
Occupational Therapy Evaluation Patient Details Name: Tracy Mclaughlin MRN: [id number] DOB: 11-20-1934 Today's Date: 07/16/2020    History of Present Illness 86 yo female admitted with N/V/D, abd pain, UTI, ascites. Hx of NHL, hypothyroidism, A fib, anemia, OA, PE, SAH   Clinical Impression   Tracy Mclaughlin is an 86 year old woman who presents with generalized weakness, decreased activity tolerance, decreased balance and requiring 4 liters of oxygen on evaluation resulting in decreased ability to perform transfers, ambulate and perform ADLs. Patient's affect flat and verbalizations minimized with therapist and patient requiring encouragment and increased time to perform. Patient min assist for transfers, min guard with Rw to transfer to recliner and requiring significant assistance with lower body ADLs and toileting. Patient able to perform grooming tasks at edge of bed and then in chair with back supported with set up. Patient reports fatigue with minimal activity. O2 sats and HR WNL during evaluation. Therapist recommends rehab at discharge.    Follow Up Recommendations  SNF    Equipment Recommendations  Other (comment) (TBD)    Recommendations for Other Services       Precautions / Restrictions Precautions Precautions: Fall Precaution Comments: monitor O2,HR Restrictions Weight Bearing Restrictions: No      Mobility Bed Mobility Overal bed mobility: Needs Assistance       Supine to sit: Min assist     General bed mobility comments: Min assist for LLE to transfer into sittnig at side of bed. Patient used bed rail for assistance.  Transfers Overall transfer level: Needs assistance Equipment used: Rolling walker (2 wheeled) Transfers: Sit to/from [facility] Sit to Stand: Min assist;From elevated surface Stand pivot transfers: Min guard       General transfer comment: Min guard to stand from elevated bed height. min guard to take steps to  recliner using RW.    Balance Overall balance assessment: Needs assistance Sitting-balance support: No upper extremity supported;Feet supported Sitting balance-Leahy Scale: Good     Standing balance support: Bilateral upper extremity supported Standing balance-Leahy Scale: Fair                             ADL either performed or assessed with clinical judgement   ADL Overall ADL's : Needs assistance/impaired Eating/Feeding: Independent   Grooming: Wash/dry face;Wash/dry hands;Oral care;Sitting;Brushing hair;Set up   Upper Body Bathing: Minimal assistance;Sitting   Lower Body Bathing: Maximal assistance;Sit to/from stand;Set up   Upper Body Dressing : Minimal assistance;Sitting;Set up   Lower Body Dressing: Maximal assistance;Sit to/from stand;Set up   Toilet Transfer: Stand-pivot;Min guard;BSC;RW   Toileting- Clothing Manipulation and Hygiene: Maximal assistance;Sit to/from stand       Functional mobility during ADLs: Min guard;Rolling walker       Vision   Vision Assessment?: No apparent visual deficits     Perception     Praxis      Pertinent Vitals/Pain Pain Assessment: No/denies pain     Hand Dominance     Extremity/Trunk Assessment Upper Extremity Assessment Upper Extremity Assessment: Overall WFL for tasks assessed   Lower Extremity Assessment Lower Extremity Assessment: Defer to PT evaluation   Cervical / Trunk Assessment Cervical / Trunk Assessment: Kyphotic   Communication Communication Communication: No difficulties   Cognition Arousal/Alertness: Awake/alert Behavior During Therapy: Flat affect Overall Cognitive Status: Within Functional Limits for tasks assessed  General Comments       Exercises     Shoulder Instructions      Home Living Family/patient expects to be discharged to:: Private residence Living Arrangements: Spouse/significant other Available Help at  Discharge: Family Type of Home: House                       Home Equipment: Gilford Rile - 2 wheels;Cane - single point;Bedside commode;Shower seat          Prior Functioning/Environment Level of Independence: Independent with assistive device(s)        Comments: uses cane. pt stated she performs bathing, dressing unassisted. husband takes care of all household tasks        OT Problem List: Decreased strength;Decreased activity tolerance;Impaired balance (sitting and/or standing);Decreased knowledge of use of DME or AE      OT Treatment/Interventions: Self-care/ADL training;DME and/or AE instruction;Therapeutic exercise;Patient/family education;Neuromuscular education;Therapeutic activities;Balance training    OT Goals(Current goals can be found in the care plan section) Acute Rehab OT Goals Patient Stated Goal: to go home at discharge OT Goal Formulation: With patient Time For Goal Achievement: 07/30/20 Potential to Achieve Goals: Fair  OT Frequency: Min 2X/week   Barriers to D/C:            Co-evaluation              AM-PAC OT "6 Clicks" Daily Activity     Outcome Measure Help from another person eating meals?: None Help from another person taking care of personal grooming?: A Little Help from another person toileting, which includes using toliet, bedpan, or urinal?: A Lot Help from another person bathing (including washing, rinsing, drying)?: A Lot Help from another person to put on and taking off regular upper body clothing?: A Little Help from another person to put on and taking off regular lower body clothing?: A Lot 6 Click Score: 16   End of Session Equipment Utilized During Treatment: Rolling walker;Oxygen Nurse Communication: Mobility status  Activity Tolerance: Patient tolerated treatment well Patient left: in chair;with call bell/phone within reach;with chair alarm set  OT Visit Diagnosis: Unsteadiness on feet (R26.81);Muscle weakness  (generalized) (M62.81)                Time: 6503-5465 OT Time Calculation (min): 35 min Charges:  OT General Charges $OT Visit: 1 Visit OT Evaluation $OT Eval Moderate Complexity: 1 Mod OT Treatments $Self Care/Home Management : 8-22 mins  Bianey Tesoro, OTR/L [place] [phone] Pager: [place] 07/16/2020, 10:43 AM

## 2020-07-17 ENCOUNTER — Inpatient Hospital Stay (HOSPITAL_COMMUNITY): Payer: Medicare PPO

## 2020-07-17 DIAGNOSIS — J9602 Acute respiratory failure with hypercapnia: Secondary | ICD-10-CM | POA: Diagnosis not present

## 2020-07-17 DIAGNOSIS — J9601 Acute respiratory failure with hypoxia: Secondary | ICD-10-CM

## 2020-07-17 LAB — CBC
HCT: 31.1 % — ABNORMAL LOW (ref 36.0–46.0)
Hemoglobin: 10 g/dL — ABNORMAL LOW (ref 12.0–15.0)
MCH: 34 pg (ref 26.0–34.0)
MCHC: 32.2 g/dL (ref 30.0–36.0)
MCV: 105.8 fL — ABNORMAL HIGH (ref 80.0–100.0)
Platelets: 154 10*3/uL (ref 150–400)
RBC: 2.94 MIL/uL — ABNORMAL LOW (ref 3.87–5.11)
RDW: 14.8 % (ref 11.5–15.5)
WBC: 4.9 10*3/uL (ref 4.0–10.5)
nRBC: 0 % (ref 0.0–0.2)

## 2020-07-17 LAB — COMPREHENSIVE METABOLIC PANEL
ALT: 31 U/L (ref 0–44)
AST: 55 U/L — ABNORMAL HIGH (ref 15–41)
Albumin: 2.5 g/dL — ABNORMAL LOW (ref 3.5–5.0)
Alkaline Phosphatase: 143 U/L — ABNORMAL HIGH (ref 38–126)
Anion gap: 12 (ref 5–15)
BUN: 14 mg/dL (ref 8–23)
CO2: 37 mmol/L — ABNORMAL HIGH (ref 22–32)
Calcium: 7.7 mg/dL — ABNORMAL LOW (ref 8.9–10.3)
Chloride: 91 mmol/L — ABNORMAL LOW (ref 98–111)
Creatinine, Ser: 0.66 mg/dL (ref 0.44–1.00)
GFR calc Af Amer: >60 mL/min (ref >60)
GFR calc non Af Amer: >60 mL/min (ref >60)
Glucose, Bld: 98 mg/dL (ref 70–99)
Potassium: 4 mmol/L (ref 3.5–5.1)
Sodium: 140 mmol/L (ref 135–145)
Total Bilirubin: 1.1 mg/dL (ref 0.3–1.2)
Total Protein: 4.9 g/dL — ABNORMAL LOW (ref 6.5–8.1)

## 2020-07-17 LAB — BODY FLUID CELL COUNT WITH DIFFERENTIAL
Lymphs, Fluid: 83 %
Monocyte-Macrophage-Serous Fluid: 16 % — ABNORMAL LOW (ref 50–90)
Neutrophil Count, Fluid: 1 % (ref 0–25)
Total Nucleated Cell Count, Fluid: 968 cu mm (ref 0–1000)

## 2020-07-17 LAB — PHOSPHORUS: Phosphorus: 2.5 mg/dL (ref 2.5–4.6)

## 2020-07-17 LAB — VANCOMYCIN, TROUGH: Vancomycin Tr: 24 ug/mL (ref 15–20)

## 2020-07-17 LAB — MAGNESIUM: Magnesium: 2.4 mg/dL (ref 1.7–2.4)

## 2020-07-17 MED ORDER — ACALABRUTINIB 100 MG PO CAPS
100.0000 mg | ORAL_CAPSULE | Freq: Every day | ORAL | Status: DC
  Administered 2020-07-17 – 2020-07-24 (×8): 100 mg via ORAL

## 2020-07-17 MED ORDER — VANCOMYCIN HCL 500 MG/100ML IV SOLN
500.0000 mg | Freq: Two times a day (BID) | INTRAVENOUS | Status: DC
  Administered 2020-07-17 – 2020-07-19 (×4): 500 mg via INTRAVENOUS
  Filled 2020-07-17 (×6): qty 100

## 2020-07-17 NOTE — Progress Notes (Signed)
Pharmacy Antibiotic Note  Tracy Mclaughlin is a 86 y.o. female with B-cell lymphoma on   on 06/23/2020 on acalabrutinib PTA, presented to the ED on 7/5 with c/o n/v/d and abdominal pain.  She has recurrent pleural effusion with thoracentesis performed on 7/20.  Pleural fluid culture grew staph epi. CXR on 7/21 showed "bilateral airspace opacities concerning for multifocal pneumonia."  CXR 7/28 shows reaccumulation of the right pleural effusion.  It is unclear whether the effusion is related to lymphoma, infection, or another etiology.   She's currently on vancomycin.  Today, 07/17/2020: - Day #8 abx - Afeb, WBC wnl - SCr stable (crcl~53) - UOP appears adequate/stable  Plan: - Decrease vancomycin 500 mg IV q12h - F/u renal function, clinical status and plan/LOT for abx - Will plan on checking vancomycin level at new steady state if therapy continues  Height: 5\' 8"  (172.7 cm) Weight: 68.8 kg (151 lb 10.8 oz) IBW/kg (Calculated) : 63.9  Temp (24hrs), Avg:98.3 F (36.8 C), Min:98.1 F (36.7 C), Max:98.4 F (36.9 C)  Recent Labs  Lab 07/13/20 0442 07/14/20 0416 07/15/20 0408 07/16/20 0543 07/17/20 0433  WBC 4.6 4.8 5.7 5.2 4.9  CREATININE 0.69 0.68 0.75 0.80 0.66  VANCOTROUGH  --   --   --   --  24*    Estimated Creatinine Clearance: 53.7 mL/min (by C-G formula based on SCr of 0.66 mg/dL).    Allergies  Allergen Reactions  . Demerol [Meperidine Hcl] Nausea And Vomiting    Antimicrobials this admission:  7/5 Zosyn >> 7/9>> resumed 7/22>>7/27 7/9 CTX >> 7/12 7/22 Vanc >>   Dose adjustments this admission:  7/29 VT at 0500 = 24 on 750mg  q12h  Microbiology results:  7/5 BCx: NGF 7/5 UCx: EColi- R Amp/Unasyn, sens to all others 7/5 C diff: neg 7/16 R pleural fluid: NGF 7/20 Pleural Fluid: staph epi (S= clin, gent, rif, bactrim, vanc; R=eryth, tetra, oxa; I= cipro) FINAL  Thank you for allowing pharmacy to be a part of this patient's care.  Peggyann Juba, PharmD,  [facility] Pharmacy: [phone] 07/17/2020 7:08 AM

## 2020-07-17 NOTE — Progress Notes (Addendum)
PROGRESS NOTE    Tracy Mclaughlin  ZOX:[id number] DOB: January 31, 1934 DOA: 06/23/2020 PCP: [name], PA    Brief Narrative:  This patient is 86 y.o.femalewith medical history significant forlow-grade lymphoma without significant response to chemotherapy last year and was recently started onBTK INHIBITORby Dr. [name], hypothyroidism, history of A. fib, pancytopenia presents to the ED with3-4 day history of nausea vomiting and diarrhea and abdominal pain-and right upper quadrant pain.symptoms started after new chemo on Thursday.Patient denies any fever,  chest pain.vomitted x 3 today and had non bloody diarrhea 4 times today.  ED Course:Hemodynamically stable, afebrile, UA positive for UTI, CT scan abdomen done that showed free fluid with ascites and mesenteric edema, case was discussed Dr. [name] from oncology,  advised UTI treatment with Zosyn .  Patient  was admitted, being managed symptomatically, chemo was held,  diarrhea improved subsequently,  oncology put the patient back on chemo overall tolerating well. Having nausea anorexia suspecting this is related to her lymphoma. Patient has been started on prednisone to help with appetite and also received trazodone for sleep. Patient remains deconditioned.  She was found to get more sleepy and confused subsequently trazodone and prednisone were discontinued with resolution of her confusion. Patient was also noticed to have shortness of breath/dyspnea with activity-imaging studies showed pleural effusion underwent right-sided thoracentesis with 1.6 L of blood-tinged fluid removal, cytology reactive medically stable, no growth on culture. PT had suggested skilled nursing facility,  patient had PICC line, plan:  SNF as well as home health.  Patient went into rapid tachycardia supraventricular 7/19 a.m. and also with hypoxia-underwent CTA chest no PE but right-sided pleural effusion atelectasis/consolidation-cardiologist consulted.   Patient converted to sinus rhythm spontaneously. received few days of IV antibiotics , Procalcitonin has been negative/normal- no fever no leukocytosis. Patient continues to remain hypoxic and in respiratory failure, chest x-ray shows reaccumulation of the right pleural effusion.  Pulmonology consulted for repeat thoracocentesis or potentially Pleurx catheter.  Oncology resumed acalabrutinib plan if she does not improve would recommend hospice.   Assessment & Plan:   Principal Problem:   Recurrent pleural effusion on right Active Problems:   Non Hodgkin's lymphoma (HCC)   Hypertension   Anemia, iron deficiency   PAF (paroxysmal atrial fibrillation) (HCC)   Anemia   Pancytopenia (HCC)   Nausea & vomiting   UTI (urinary tract infection)   Ascites   Acute respiratory failure with hypoxia and hypercapnia (HCC)  Acute hypoxic respiratory failure due to pleural effusion / No PE on the CT. -Stable, still requiring 4 L of oxygen, to maintain O2 sat greater than 92% -Currently satting 97% -Pleural cultures are growing gram-positive cocci - on  broad-spectrum antibiotics  Vanco and Zosyn  -we will modify and narrow down antibiotics according to final cultures and sensitivity. -Pleural fluid grew staph epi, we will continue with vancomycin and DC Zosyn.  -Remains in respiratory distress,  O2 dependent, hypoxic,  worsening shortness of breath with minimal exertion - Recurrent pleural effusion -07/08/2020 US guidedrightthoracentesis. Yielded500 mLof redfluid. CXR: 1. Increase in volume of right pleural effusion with mild interstitial edema 2. Bilateral airspace opacities concerning for multifocal pneumonia.  -Continue gentle diuretics -As needed duo nebs  -Low suspicion for pneumonia, sepsis. - chest x-ray shows reaccumulation of the right pleural effusion.   - Pulmonology consulted for repeat thoracocentesis or potentially Pleurx catheter.  Intractable nausea vomitingdiarrhea  andabdominal pain: -Resolved -Unclear etiology suspecting due to [place] held on admission and resumed 7/8.  Diarrhea improved.   Acalabrutinib  was again discontinued as her LFTs were worsening. Has new onset ascites on the CT scan-but too small to tap on the ultrasound . Cont supportive measure.  - Prednisone was added to stimulate appetite but discontinued due to confusion  Recurrent pleural effusion: -Likely from  Lymphoma Vs infection or 2/2 Acalabrutinib ( less likely per oncology) . S/p rt thoracentesis 1.6 L blood-tinged fluid was removed.  Pleural fluid Gram stain no organism, culture no growth so far, cytology reactive mesothelial cell.   CTA shows moderate right and mild to moderate left pleural effusions-BNP around 400  -Echo shows preserved EJF 60 to 65%.  no diastolic dysfunction, unclear if related to CHF versus low Albumin.   Cardiology on board will start trial of IV Lasix low-dose, I's and O's: +10.1 L  - ordered US guided thoracentesis along with labs/cytology, discussed with cardio and  Oncology.  Narrow complex tachycardia - Resolved.  Converted to sinus rhythm.  No recurrent episode -remained stable- Cardiology following closely -Continue diltiazem CD 180 nightly, Lopressor 12.5 mg p.o. twice daily, -Following thyroid function -2D echocardiogram reviewed.  Ascites: -new onset ascites on the CT scan-too small to tap on paracentesis per IR.  Abnormal LFTS: -Improving 2/2 Acalabrutinib-which is discontinued.  LFTs improving. RUQ [place]- possible cirrhotic changes, hepatitis panel negative.   Acute metabolic encephalopathy from polypharmacy: -Mentation back to baseline  had  confusion/Lethargy: 7/14 a.m likley polypharmacy and pt feels improved after stopping trazodone/prednisone.   -Continue to complain of insomnia, sleeping medication as needed  She is on Xanax low-dose continue for anxiety.  Continue supportive care.   E coli UTI POA: Completed  7 days of antibiotics.   Non Hodgkin's lymphoma -Remained stable, -Oncology following -with progressive decline, patientwasstartedpast weekonAcalabrutinib she did not have significant response to chemotherapy last year.Acalabrutinibwas held on admission and resumed  But again stopped due to worsening LFTs by oncology.  Oncology resumed acalabrutinib plan if she does not improve would recommend hospice.   Failure to thrive/deconditioning: Remains weak and deconditioned.  -PT continues to recommend SNF -She refused SNF.  She is high risk for readmission.  Husband concerned about poor support.  Hypertension: BP is controlled.  Continue Cardizem.    Pancytopenia with anemia/Thrombocytopenia in the setting of lymphoma/chemo: -Hemoglobin and WBC count overall stable.  Monitor. Heparin was discontinued by hematology due to bilateral lower leg bruise and thrombocytopenia.    History of intermittent atrial fibrillation on Cardizem: - Not on anticoagulation due to history of subarachnoid hemorrhage as per cardiology. Bruise on the skin heparin discontinued per oncology. improving.   DVT prophylaxis: SCDS Code Status: DNR Family Communication: Discussed with patient today.    Status is: Inpatient Remains inpatient appropriate because: Patient remains deconditioned weak, hypoxic.  Dispo: The patient is from: Home   Anticipated d/c is to: SNF as per PT- pt declines SNF and HH.  Continued to engage on placement recomendations.  Anticipated d/c date is: 2 days.  Once shortness is better, and cleared by her oncologist.discussed with her oncology   Patient currently not medically stable.   Consultants:   Hematology, Pulmonology  Procedures: Right thoracocentesis  Antimicrobials: Anti-infectives (From admission, onward)   Start     Dose/Rate Route Frequency Ordered Stop   07/17/20 1800  vancomycin (VANCOREADY) IVPB 500 mg/100 mL     Discontinue      500 mg 100 mL/hr over 60 Minutes Intravenous Every 12 hours 07/17/20 0708     07/10/20 1600  vancomycin (VANCOREADY) IVPB 750 mg/150 mL  Status:  Discontinued        750 mg 150 mL/hr over 60 Minutes Intravenous Every 12 hours 07/10/20 1332 07/17/20 0708   07/10/20 1430  piperacillin-tazobactam (ZOSYN) IVPB 3.375 g  Status:  Discontinued        3.375 g 12.5 mL/hr over 240 Minutes Intravenous Every 8 hours 07/10/20 1332 07/15/20 1333   07/06/20 1800  cefTRIAXone (ROCEPHIN) 1 g in sodium chloride 0.9 % 100 mL IVPB  Status:  Discontinued        1 g 200 mL/hr over 30 Minutes Intravenous Every 24 hours 07/06/20 1646 07/08/20 1037   07/06/20 1800  azithromycin (ZITHROMAX) tablet 500 mg  Status:  Discontinued        500 mg Oral Daily 07/06/20 1646 07/08/20 1037   06/27/20 0830  cefTRIAXone (ROCEPHIN) 1 g in sodium chloride 0.9 % 100 mL IVPB  Status:  Discontinued        1 g 200 mL/hr over 30 Minutes Intravenous Every 24 hours 06/27/20 0733 06/30/20 1340   06/24/20 0200  piperacillin-tazobactam (ZOSYN) IVPB 3.375 g  Status:  Discontinued        3.375 g 12.5 mL/hr over 240 Minutes Intravenous Every 8 hours 06/23/20 1703 06/27/20 0733   06/23/20 1645  piperacillin-tazobactam (ZOSYN) IVPB 3.375 g        3.375 g 100 mL/hr over 30 Minutes Intravenous  Once 06/23/20 1638 06/23/20 1834   06/23/20 1615  cefTRIAXone (ROCEPHIN) 1 g in sodium chloride 0.9 % 100 mL IVPB  Status:  Discontinued        1 g 200 mL/hr over 30 Minutes Intravenous  Once 06/23/20 1607 06/23/20 1608     Subjective: Patient was seen and examined at bedside.  No overnight events.  She appears comfortable but continues to have shortness of breath on exertion.  she is scheduled to have a thoracocentesis.    Objective: Vitals:   07/16/20 2108 07/17/20 0542 07/17/20 1100 07/17/20 1321  BP: (!) 122/43 (!) 133/47  (!) 115/44  Pulse: 80 75  87  Resp: 20 19  (!) 24  Temp: 98.3 F (36.8 C) 98.1 F (36.7 C)  98.7 F (37.1 C)    TempSrc: Oral   Oral  SpO2: 95% 98% 94% 98%  Weight:      Height:        Intake/Output Summary (Last 24 hours) at 07/17/2020 1353 Last data filed at 07/17/2020 1100 Gross per 24 hour  Intake 2500 ml  Output 1800 ml  Net 700 ml   Filed Weights   07/12/20 0500 07/15/20 0500 07/16/20 0614  Weight: 71.1 kg 65.5 kg 68.8 kg    Examination:  General exam: Appears calm and comfortable  Respiratory system: Clear to auscultation. Respiratory effort normal.  Porta catheter on the chest. Cardiovascular system: S1 & S2 heard, RRR. No JVD, murmurs, rubs, gallops or clicks. No pedal edema. Gastrointestinal system: Abdomen is nondistended, soft and nontender. No organomegaly or masses felt. Normal bowel sounds heard. Central nervous system: Alert and oriented. No focal neurological deficits. Extremities:  No pain, Bruising noted on LE Skin: No rashes, lesions or ulcers Psychiatry: Judgement and insight appear normal. Mood & affect appropriate.     Data Reviewed: I have personally reviewed following labs and imaging studies  CBC: Recent Labs  Lab 07/13/20 0442 07/14/20 0416 07/15/20 0408 07/16/20 0543 07/17/20 0433  WBC 4.6 4.8 5.7 5.2 4.9  HGB 9.9* 10.4* 10.4* 9.9* 10.0*  HCT 31.4* 32.6* 32.2* 30.1* 31.1*  MCV 106.1* 106.5* 104.9* 102.7* 105.8*  PLT 123* 128* 152 152 921   Basic Metabolic Panel: Recent Labs  Lab 07/13/20 0442 07/14/20 0416 07/15/20 0408 07/16/20 0543 07/17/20 0433  NA 140 136 138 138 140  K 2.9* 2.8* 3.1* 2.5* 4.0  CL 87* 84* 87* 88* 91*  CO2 40* 40* 40* 37* 37*  GLUCOSE 106* 107* 102* 104* 98  BUN 16 14 13 16 14   CREATININE 0.69 0.68 0.75 0.80 0.66  CALCIUM 7.0* 7.0* 7.2* 7.2* 7.7*  MG 1.4*  --   --  1.5* 2.4  PHOS 3.8  --   --   --  2.5   GFR: Estimated Creatinine Clearance: 53.7 mL/min (by C-G formula based on SCr of 0.66 mg/dL). Liver Function Tests: Recent Labs  Lab 07/17/20 0433  AST 55*  ALT 31  ALKPHOS 143*  BILITOT 1.1  PROT 4.9*   ALBUMIN 2.5*   No results for input(s): LIPASE, AMYLASE in the last 168 hours. No results for input(s): AMMONIA in the last 168 hours. Coagulation Profile: No results for input(s): INR, PROTIME in the last 168 hours. Cardiac Enzymes: No results for input(s): CKTOTAL, CKMB, CKMBINDEX, TROPONINI in the last 168 hours. BNP (last 3 results) No results for input(s): PROBNP in the last 8760 hours. HbA1C: No results for input(s): HGBA1C in the last 72 hours. CBG: No results for input(s): GLUCAP in the last 168 hours. Lipid Profile: No results for input(s): CHOL, HDL, LDLCALC, TRIG, CHOLHDL, LDLDIRECT in the last 72 hours. Thyroid Function Tests: No results for input(s): TSH, T4TOTAL, FREET4, T3FREE, THYROIDAB in the last 72 hours. Anemia Panel: No results for input(s): VITAMINB12, FOLATE, FERRITIN, TIBC, IRON, RETICCTPCT in the last 72 hours. Sepsis Labs: No results for input(s): PROCALCITON, LATICACIDVEN in the last 168 hours.  Recent Results (from the past 240 hour(s))  Gram stain     Status: None   Collection Time: 07/08/20  5:03 PM   Specimen: Fluid  Result Value Ref Range Status   Specimen Description FLUID PLEURAL  Final   Special Requests   Final    BOTTLES DRAWN AEROBIC AND ANAEROBIC Blood Culture adequate volume   Gram Stain   Final    RARE WBC PRESENT, PREDOMINANTLY MONONUCLEAR NO ORGANISMS SEEN Performed at [facility], [address] [place], [place] [address]    Report Status 07/08/2020 FINAL  Final  Culture, body fluid-bottle     Status: Abnormal   Collection Time: 07/08/20  5:04 PM   Specimen: Fluid  Result Value Ref Range Status   Specimen Description FLUID PLEURAL  Final   Special Requests   Final    BOTTLES DRAWN AEROBIC AND ANAEROBIC Blood Culture adequate volume   Gram Stain   Final    GRAM POSITIVE COCCI IN CLUSTERS AEROBIC BOTTLE ONLY CRITICAL RESULT CALLED TO, READ BACK BY AND VERIFIED WITH: RN RUTH M. [id number] FCP Performed at [facility], [address], [place], [place] [address]    Culture STAPHYLOCOCCUS EPIDERMIDIS (A)  Final   Report Status 07/12/2020 FINAL  Final   Organism ID, Bacteria STAPHYLOCOCCUS EPIDERMIDIS  Final      Susceptibility   Staphylococcus epidermidis - MIC*    CIPROFLOXACIN 2 INTERMEDIATE Intermediate     ERYTHROMYCIN >=8 RESISTANT Resistant     GENTAMICIN 2 SENSITIVE Sensitive     OXACILLIN >=4 RESISTANT Resistant     TETRACYCLINE >=16 RESISTANT Resistant  VANCOMYCIN 2 SENSITIVE Sensitive     TRIMETH/SULFA 20 SENSITIVE Sensitive     CLINDAMYCIN <=0.25 SENSITIVE Sensitive     RIFAMPIN <=0.5 SENSITIVE Sensitive     Inducible Clindamycin NEGATIVE Sensitive     * STAPHYLOCOCCUS EPIDERMIDIS     Radiology Studies: DG CHEST PORT 1 VIEW  Result Date: 07/16/2020 CLINICAL DATA:  86 year old female with history of shortness of breath. Pleural effusion. EXAM: PORTABLE CHEST 1 VIEW COMPARISON:  Chest x-ray 07/14/2020. FINDINGS: Right subclavian single-lumen porta cath with tip terminating at the superior cavoatrial junction. Enlarging large right pleural effusion with extensive passive atelectasis in the right mid to lower lung. Left lung is clear. No left pleural effusion. No pneumothorax. No evidence of pulmonary edema. Heart size appears normal. Aortic atherosclerosis. Surgical clips project over the left axillary region, presumably from prior left axillary nodal dissection. IMPRESSION: 1. Enlarging right pleural effusion with worsening passive atelectasis in the right lung. 2. Aortic atherosclerosis. Electronically Signed   By: Vinnie Langton M.D.   On: 07/16/2020 10:17    Scheduled Meds: . acalabrutinib  100 mg Oral QHS  . Chlorhexidine Gluconate Cloth  6 each Topical Daily  . citalopram  20 mg Oral Daily  . diltiazem  180 mg Oral QHS  . feeding supplement ([place] STANDARD 1.4)  325 mL Oral Daily  . feeding supplement (PRO-STAT SUGAR FREE 64)  30 mL Oral Daily  . furosemide  20 mg  Intravenous BID  . heparin lock flush  500 Units Intracatheter Q30 days  . levothyroxine  224 mcg Oral Q0600  . melatonin  3 mg Oral QHS  . metoprolol tartrate  12.5 mg Oral BID  . multivitamin with minerals  1 tablet Oral Daily  . pantoprazole  40 mg Oral Daily  . potassium chloride  40 mEq Oral BID   Continuous Infusions: . sodium chloride 10 mL/hr at 07/07/20 0945  . vancomycin       LOS: 23 days    Time spent: 25 mins.    Shawna Clamp, MD [facility]   If 7PM-7AM, please contact night-coverage

## 2020-07-17 NOTE — Progress Notes (Signed)
RRT to assist with bedside thoracentisis. Consent obtained, timeout preformed.  VS remained stable throughout procedure and patient tolerated well. C/o no pain upon completion of procedure.

## 2020-07-17 NOTE — Progress Notes (Signed)
PT Cancellation Note  Patient Details Name: Tracy Mclaughlin MRN: [id number] DOB: [birth year]   Cancelled Treatment:    Reason Eval/Treat Not Completed: Medical issues which prohibited therapy, chart  Reviewed. Will check back tomorrow .   Claretha Cooper 07/17/2020, 4:24 PM  [place] Pager [phone] Office [phone]

## 2020-07-17 NOTE — Progress Notes (Signed)
Name: Tracy Mclaughlin MRN: [id number] DOB: 05-Oct-1934    ADMISSION DATE:  06/23/2020 CONSULTATION DATE:  07/09/2020  REFERRING MD :  Dr. Barrett Henle  CHIEF COMPLAINT:  Recurrent Pleural Effusion   BRIEF PATIENT DESCRIPTION:  86 yo female, currently being treated for Lymphoma. Now with Bilateral Recurrent Pleural Effusions   HISTORY OF PRESENT ILLNESS:   Admitted 7/5 with Nausea/Vomiting, Diarrhea. Has Lymphoma without significant response to chemotherapy, recently started on BTK Inhibitor. CTA with free fluid, ascites and mesenteric edema. Treated for possible UTI. Stay complicated with bilateral recurrent pleural effusions. Underwent Thoracentesis on 7/16 for Right Side and 7/20 on Left Side. Gram Stain negative, culture with no growth, cytology with reactive mesothelial cells. On 7/19 patient with narrow complex tachycardia, cardiology consulted and added scheduled lopressor in addition to Cardizem. Pulmonary Consult 7/21.   SIGNIFICANT EVENTS  Right Thora 7/16 > 1.6L Out, Bloody  Left Thora 7/20 > 500 ml Out, Bloody  STUDIES:  CXR 7/15 >  Decreased left pleural effusion, with persistent left basilar consolidation. Increasing right pleural effusion, with increasing right basilar. Consolidation. CTA Chest 7/19 > Moderate right and mild to moderate left pleural effusions. Right basilar atelectasis/consolidation and left basilar atelectasis. Interlobular septal thickening in the right lung may reflect asymmetric edema. Multifocal adenopathy appears stable to slightly improved. CXR 7/21 >  Increase in volume of right pleural effusion with mild interstitial edema.  Bilateral airspace opacities concerning for multifocal pneumonia.   SUBJECTIVE:  O2 weaned to 2.5L (was previously on 4L) Pt reports she is nauseated after attempting to eat, feels poorly.  Talks of going home on hospice & being with her dog.   VITAL SIGNS: Temp:  [98.1 F (36.7 C)-98.4 F (36.9 C)] 98.1 F (36.7 C)  (07/29 0542) Pulse Rate:  [75-81] 75 (07/29 0542) Resp:  [19-20] 19 (07/29 0542) BP: (119-133)/(43-47) 133/47 (07/29 0542) SpO2:  [94 %-98 %] 98 % (07/29 0542)  PHYSICAL EXAMINATION: General: frail elderly female lying in bed in NAD  HEENT: MM pink/moist, good dentition Neuro: AAOx4, speech clear, MAE  CV: s1s2 rrr, no m/r/g PULM: mild tachypnea but non labored, diminished breath sounds on right, clear on left  GI: soft, bsx4 active  Extremities: warm/dry  Skin: no rashes or lesions   Recent Labs  Lab 07/15/20 0408 07/16/20 0543 07/17/20 0433  NA 138 138 140  K 3.1* 2.5* 4.0  CL 87* 88* 91*  CO2 40* 37* 37*  BUN 13 16 14   CREATININE 0.75 0.80 0.66  GLUCOSE 102* 104* 98   Recent Labs  Lab 07/15/20 0408 07/16/20 0543 07/17/20 0433  HGB 10.4* 9.9* 10.0*  HCT 32.2* 30.1* 31.1*  WBC 5.7 5.2 4.9  PLT 152 152 154   DG CHEST PORT 1 VIEW  Result Date: 07/16/2020 CLINICAL DATA:  86 year old female with history of shortness of breath. Pleural effusion. EXAM: PORTABLE CHEST 1 VIEW COMPARISON:  Chest x-ray 07/14/2020. FINDINGS: Right subclavian single-lumen porta cath with tip terminating at the superior cavoatrial junction. Enlarging large right pleural effusion with extensive passive atelectasis in the right mid to lower lung. Left lung is clear. No left pleural effusion. No pneumothorax. No evidence of pulmonary edema. Heart size appears normal. Aortic atherosclerosis. Surgical clips project over the left axillary region, presumably from prior left axillary nodal dissection. IMPRESSION: 1. Enlarging right pleural effusion with worsening passive atelectasis in the right lung. 2. Aortic atherosclerosis. Electronically Signed   By: Vinnie Langton M.D.   On: 07/16/2020 10:17  ASSESSMENT / PLAN:  Acute Hypoxic Respiratory Failure in setting of recurrent bilateral pleural effusions, concern for malignancy vs acute heart failure/volume overload, vs infectious process PCT <0.10,  Afebrile, WBC 3.7.  [place] ABD Questionable for Cirrhosis.  Pleural fluid from 7/21 thoracentesis with reactive mesothelial cells only. Exudative effusion. Staph epidermis in pleural fluid likely contaminant.   Persistent R effusion on CXR 7/26. 7/20 Pleural cytology with atypical cells, approx 9% monoclonal B cell population detected.   -repeat reassessment of pleural space, consider for repeat thora with assessment of flow cytometry, cytology & culture -pt requested we come back for thoracentesis, will reassess this afternoon -if proven to not be infected & is malignant, could consider pleurx catheter for patient comfort -follow intermittent CXR  -wean O2 for sats > 90%, will need ambulatory O2 needs assessment prior to discharge  -pulmonary hygiene -IS, mobilize      Noe Gens, MSN, NP-C [facility] 07/17/2020, 11:38 AM   Please see [email or web address] for pager details.

## 2020-07-17 NOTE — Procedures (Signed)
Thoracentesis Procedure Note  Pre-operative Diagnosis: lymphoma, right pleural effusion, staph epi infection in pleural fluid?  Post-operative Diagnosis: same  Indications: as above  Procedure Details  Consent: Informed consent was obtained. Risks of the procedure were discussed including: infection, bleeding, pain, pneumothorax.  Under sterile conditions the patient was positioned. Betadine solution and sterile drapes were utilized.  1% buffered lidocaine was used to anesthetize the 10th rib space. Fluid was obtained without any difficulties and minimal blood loss.  A dressing was applied to the wound and wound care instructions were provided.   Findings 1400 ml of clear pleural fluid was obtained. A sample was sent to Pathology for cytogenetics, flow, and cell counts, as well as for infection analysis.  Complications:  None; patient tolerated the procedure well.          Condition: stable  Plan A follow up chest x-ray was ordered. Bed Rest for 0 hours. Tylenol 650 mg. for pain.  Attending Attestation: I performed the procedure.  Roselie Awkward, MD [place] [facility] Pager: [phone] Cell: [phone] If no response, call [phone]

## 2020-07-17 NOTE — Progress Notes (Addendum)
HEMATOLOGY-ONCOLOGY PROGRESS NOTE  SUBJECTIVE: She is alert this morning.  Remains on O2.  She offers no other complaints today.  Oncology History  Non Hodgkin's lymphoma ([place])  09/29/2016 Initial Diagnosis   Non Hodgkin's lymphoma ([place])   03/22/2019 - 04/23/2019 Chemotherapy   The patient had palonosetron (ALOXI) injection 0.25 mg, 0.25 mg, Intravenous,  Once, 1 of 4 cycles Administration: 0.25 mg (03/22/2019) riTUXimab (RITUXAN) 800 mg in sodium chloride 0.9 % 250 mL (2.4242 mg/mL) infusion, 375 mg/m2 = 800 mg, Intravenous,  Once, 1 of 4 cycles Administration: 800 mg (03/22/2019) bendamustine (BENDEKA) 150 mg in sodium chloride 0.9 % 50 mL (2.6786 mg/mL) chemo infusion, 70 mg/m2 = 150 mg (100 % of original dose 70 mg/m2), Intravenous,  Once, 1 of 4 cycles Dose modification: 70 mg/m2 (original dose 70 mg/m2, Cycle 1, Reason: Provider Judgment) Administration: 150 mg (03/22/2019), 150 mg (03/23/2019)  for chemotherapy treatment.    05/18/2019 - 06/28/2019 Chemotherapy   The patient had DOXOrubicin (ADRIAMYCIN) chemo injection 82 mg, 40 mg/m2 = 82 mg (100 % of original dose 40 mg/m2), Intravenous,  Once, 2 of 6 cycles Dose modification: 40 mg/m2 (original dose 40 mg/m2, Cycle 1, Reason: Provider Judgment) Administration: 82 mg (05/18/2019), 82 mg (06/08/2019) palonosetron (ALOXI) injection 0.25 mg, 0.25 mg, Intravenous,  Once, 2 of 6 cycles Administration: 0.25 mg (05/18/2019), 0.25 mg (06/08/2019) pegfilgrastim (NEULASTA ONPRO KIT) injection 6 mg, 6 mg, Subcutaneous, Once, 2 of 6 cycles Administration: 6 mg (05/18/2019), 6 mg (06/08/2019) vinCRIStine (ONCOVIN) 1 mg in sodium chloride 0.9 % 50 mL chemo infusion, 1 mg (100 % of original dose 1 mg), Intravenous,  Once, 2 of 6 cycles Dose modification: 1 mg (original dose 1 mg, Cycle 1, Reason: Provider Judgment) Administration: 1 mg (05/18/2019), 1 mg (06/08/2019) riTUXimab (RITUXAN) 800 mg in sodium chloride 0.9 % 250 mL (2.4242 mg/mL) infusion, 375 mg/m2 = 800  mg, Intravenous,  Once, 2 of 6 cycles Administration: 800 mg (05/18/2019), 800 mg (06/08/2019) cyclophosphamide (CYTOXAN) 1,240 mg in sodium chloride 0.9 % 250 mL chemo infusion, 600 mg/m2 = 1,540 mg, Intravenous,  Once, 2 of 6 cycles Dose modification: 600 mg/m2 (original dose 750 mg/m2, Cycle 2, Reason: Provider Judgment) Administration: 1,240 mg (05/18/2019), 1,240 mg (06/08/2019) fosaprepitant (EMEND) 150 mg, dexamethasone (DECADRON) 12 mg in sodium chloride 0.9 % 145 mL IVPB, , Intravenous,  Once, 2 of 6 cycles Administration:  (05/18/2019),  (06/08/2019)  for chemotherapy treatment.    07/19/2019 -  Chemotherapy   The patient had riTUXimab (RITUXAN) 700 mg in sodium chloride 0.9 % 250 mL (2.1875 mg/mL) infusion, 375 mg/m2 = 700 mg, Intravenous,  Once, 11 of 11 cycles Administration: 700 mg (07/19/2019), 700 mg (08/16/2019), 700 mg (09/13/2019), 700 mg (10/10/2019), 700 mg (11/07/2019), 700 mg (12/07/2019), 700 mg (01/04/2020), 700 mg (02/01/2020), 700 mg (03/05/2020), 700 mg (04/02/2020), 700 mg (04/30/2020) riTUXimab-pvvr (RUXIENCE) 700 mg in sodium chloride 0.9 % 250 mL (2.1875 mg/mL) infusion, 375 mg/m2 = 700 mg (original dose ), Intravenous,  Once, 0 of 1 cycle Dose modification: 375 mg/m2 (Cycle 12)  for chemotherapy treatment.     PHYSICAL EXAMINATION:  Vitals:   07/16/20 2108 07/17/20 0542  BP: (!) 122/43 (!) 133/47  Pulse: 80 75  Resp: 20 19  Temp: 98.3 F (36.8 C) 98.1 F (36.7 C)  SpO2: 95% 98%   Filed Weights   07/12/20 0500 07/15/20 0500 07/16/20 0614  Weight: 71.1 kg 65.5 kg 68.8 kg    Intake/Output from previous day: 07/28 0701 -  07/29 0700 In: 1920 [P.O.:120; IV Piggyback:1800] Out: 900 [Urine:900]  GENERAL: Appears very weak, alert HEENT: No thrush, mouth is dry LYMPH: Soft mobile left cervical and left axillary nodes, stable today LUNGS: Increased respiratory rate, diminished breath sounds on the right HEART: Regular rate and rhythm ABDOMEN: Nontender, spleen tip  palpable in the left subcostal region SKIN: Multiple ecchymoses noted on her bilateral arms, legs, and trunk-improved NEURO: Alert and oriented x3 Vascular: No leg edema  LABORATORY DATA:  I have reviewed the data as listed CMP Latest Ref Rng & Units 07/17/2020 07/16/2020 07/15/2020  Glucose 70 - 99 mg/dL 98 104(H) 102(H)  BUN 8 - 23 mg/dL '14 16 13  ' Creatinine 0.44 - 1.00 mg/dL 0.66 0.80 0.75  Sodium 135 - 145 mmol/L 140 138 138  Potassium 3.5 - 5.1 mmol/L 4.0 2.5(LL) 3.1(L)  Chloride 98 - 111 mmol/L 91(L) 88(L) 87(L)  CO2 22 - 32 mmol/L 37(H) 37(H) 40(H)  Calcium 8.9 - 10.3 mg/dL 7.7(L) 7.2(L) 7.2(L)  Total Protein 6.5 - 8.1 g/dL 4.9(L) - -  Total Bilirubin 0.3 - 1.2 mg/dL 1.1 - -  Alkaline Phos 38 - 126 U/L 143(H) - -  AST 15 - 41 U/L 55(H) - -  ALT 0 - 44 U/L 31 - -    Lab Results  Component Value Date   WBC 4.9 07/17/2020   HGB 10.0 (L) 07/17/2020   HCT 31.1 (L) 07/17/2020   MCV 105.8 (H) 07/17/2020   PLT 154 07/17/2020   NEUTROABS 2.8 06/23/2020    DG Chest 1 View  Result Date: 07/08/2020 CLINICAL DATA:  86 year old female status post right thoracentesis. EXAM: CHEST  1 VIEW COMPARISON:  Chest radiograph dated 07/06/2020. FINDINGS: Right-sided Port-A-Cath in similar position. Bilateral patchy airspace opacities, right greater left with interval progression compared to prior radiograph may represent edema or pneumonia. Clinical correlation is recommended. Small bilateral pleural effusions. No pneumothorax. Stable cardiac silhouette. Atherosclerotic calcification of the aorta. No acute osseous pathology. IMPRESSION: 1. Small bilateral pleural effusions.  No pneumothorax. 2. Bilateral airspace opacities, progressed since the prior radiograph. Electronically Signed   By: Anner Crete M.D.   On: 07/08/2020 18:26   DG Chest 1 View  Result Date: 07/04/2020 CLINICAL DATA:  Status post right thoracentesis. EXAM: CHEST  1 VIEW COMPARISON:  Chest x-ray from yesterday. FINDINGS:  Unchanged right chest wall port catheter. The heart size and mediastinal contours are within normal limits. Normal pulmonary vascularity. Trace residual right pleural effusion status post thoracentesis. Improved aeration at the right lung base. No pneumothorax. Unchanged small left pleural effusion and basilar atelectasis. No acute osseous abnormality. IMPRESSION: 1. Trace residual right pleural effusion status post thoracentesis. No pneumothorax. 2. Unchanged small left pleural effusion and basilar atelectasis. Electronically Signed   By: Titus Dubin M.D.   On: 07/04/2020 11:42   DG Chest 2 View  Result Date: 07/14/2020 CLINICAL DATA:  Dyspnea. EXAM: CHEST - 2 VIEW COMPARISON:  07/09/2020 FINDINGS: Right chest wall port a catheter is noted with tip at the cavoatrial junction. Moderate right pleural effusion the with [place] opacification of the right lower lobe is again noted. When compared with the previous exam there is improved aeration to the right and left midlung zones and left lower lobe. IMPRESSION: 1. Improved aeration to the right and left lower lobes. 2. Persistent right pleural effusion. Electronically Signed   By: Kerby Moors M.D.   On: 07/14/2020 12:16   DG Chest 2 View  Result Date: 07/09/2020 CLINICAL DATA:  Follow-up pleural effusion. EXAM: CHEST - 2 VIEW COMPARISON:  07/08/20 FINDINGS: Right chest wall port a catheter is noted with tip projecting over the cavoatrial junction. Interval increase in volume of right pleural effusion. Mild diffuse pulmonary edema. Bilateral airspace opacities are noted within the left upper lobe, right midlung and right base concerning for multifocal pneumonia. IMPRESSION: 1. Increase in volume of right pleural effusion with mild interstitial edema 2. Bilateral airspace opacities concerning for multifocal pneumonia. Electronically Signed   By: Kerby Moors M.D.   On: 07/09/2020 11:21   DG Chest 2 View  Result Date: 07/06/2020 CLINICAL DATA:  Worsening  shortness of breath. EXAM: CHEST - 2 VIEW COMPARISON:  07/04/2020 FINDINGS: The cardiac silhouette remains grossly normal in size, spur partially obscured by significantly increased patchy opacity in the right lower lung zone. Small amount of posterior pleural fluid. Mild linear density at the left lung base. Right subclavian porta catheter tip in the region of the superior cavoatrial junction. Unremarkable bones. IMPRESSION: 1. Significantly increased right lower lung zone pneumonia. 2. Small amount of posterior pleural fluid. 3. Mild left basilar atelectasis. Electronically Signed   By: Claudie Revering M.D.   On: 07/06/2020 16:24   DG Chest 2 View  Result Date: 07/03/2020 CLINICAL DATA:  Dyspnea, pleural effusions EXAM: CHEST - 2 VIEW COMPARISON:  06/26/2020 FINDINGS: Frontal and lateral views of the chest demonstrates stable right chest wall port. Cardiac silhouette is unremarkable. There is now a moderate right pleural effusion with right basilar consolidation. Decreased left pleural effusion, with only trace fluid at the left costophrenic angle. Persistent retrocardiac consolidation. No pneumothorax. No acute bony abnormalities. IMPRESSION: 1. Decreased left pleural effusion, with persistent left basilar consolidation. 2. Increasing right pleural effusion, with increasing right basilar consolidation. Electronically Signed   By: Randa Ngo M.D.   On: 07/03/2020 16:11   DG Chest 2 View  Result Date: 06/26/2020 CLINICAL DATA:  Dyspnea. EXAM: CHEST - 2 VIEW COMPARISON:  Chest CT 05/22/2020 FINDINGS: Right chest port remains in place. Hazy opacity at the left lung base consistent with pleural effusion and airspace disease/atelectasis. Right lung is clear. Upper normal heart size. Mild right hilar prominence likely secondary to combination of vascular structures and adenopathy is seen on recent CT. Fullness in the upper mediastinum likely secondary to adenopathy. No pneumothorax. No pulmonary edema. Surgical  clips in the left axilla. IMPRESSION: 1. Hazy opacity at the left lung base consistent with pleural effusion and airspace disease/atelectasis. 2. Mild right hilar prominence likely secondary to combination of vascular structures and adenopathy seen on recent CT. Electronically Signed   By: Keith Rake M.D.   On: 06/26/2020 15:54   CT ANGIO CHEST PE W OR WO CONTRAST  Result Date: 07/07/2020 CLINICAL DATA:  Lymphoma, recurrent right pleural effusion bloody tap EXAM: CT ANGIOGRAPHY CHEST WITH CONTRAST TECHNIQUE: Multidetector CT imaging of the chest was performed using the standard protocol during bolus administration of intravenous contrast. Multiplanar CT image reconstructions and MIPs were obtained to evaluate the vascular anatomy. CONTRAST:  159m OMNIPAQUE IOHEXOL 350 MG/ML SOLN COMPARISON:  None. FINDINGS: Cardiovascular: Aortic atherosclerosis. Normal heart size. Central pulmonary arteries are patent. Mediastinum/Nodes: Adenopathy is identified in the supraclavicular region, bilateral axilla, and mediastinum. Nodes appear and measures slightly smaller in size. Lungs/Pleura: Moderate right and mild to moderate left pleural effusions measuring simple fluid in density. Imaging performed during expiration. Persistent atelectasis at the left lung base. New atelectasis/consolidation at the right lung base. Interlobular septal thickening the non  dependent right lung may reflect asymmetric edema. Upper Abdomen: Ascites. Unchanged ill-defined hypoattenuating lesion in the superior spleen. Musculoskeletal: No new abnormality. Review of the MIP images confirms the above findings. IMPRESSION: Moderate right and mild to moderate left pleural effusions. Right basilar atelectasis/consolidation and left basilar atelectasis. Interlobular septal thickening in the right lung may reflect asymmetric edema. Multifocal adenopathy appears stable to slightly improved. Electronically Signed   By: Macy Mis M.D.   On:  07/07/2020 14:33   CT Abdomen Pelvis W Contrast  Result Date: 06/23/2020 CLINICAL DATA:  86 year old female with abdominal pain, nausea and vomiting. History of lymphoma. EXAM: CT ABDOMEN AND PELVIS WITH CONTRAST TECHNIQUE: Multidetector CT imaging of the abdomen and pelvis was performed using the standard protocol following bolus administration of intravenous contrast. CONTRAST:  3m OMNIPAQUE IOHEXOL 300 MG/ML  SOLN COMPARISON:  CT of the chest abdomen pelvis dated 05/22/2020. FINDINGS: Lower chest: Partially visualized moderate left and trace right pleural effusions, new since the prior CT. There is associated partial compressive atelectasis of the left lower lobe. Pneumonia is not excluded. Clinical correlation is recommended. No intra-abdominal free air. Interval development of a small ascites, new or significantly increased since the prior CT. Hepatobiliary: A 1 cm hypodense focus along the posterior liver capsule is not characterized but may represent a cyst or focal area of scarring. This is similar to prior CT. Subcentimeter hypodense focus in the anterior liver is too small to characterize. There is mild intrahepatic biliary ductal dilatation, likely post cholecystectomy. The common bile duct is dilated measuring 14 mm. No retained calcified stone noted in the central CBD. Pancreas: The pancreas is unremarkable as visualized. Spleen: Mildly enlarged spleen measuring 15 cm in craniocaudal length. Adrenals/Urinary Tract: The adrenal glands are poorly visualized but grossly unremarkable. There is mild fullness of the renal collecting systems bilaterally without frank hydronephrosis. Mild hazy appearance of the urothelium noted. Correlation with urinalysis recommended to exclude UTI. There is symmetric enhancement and excretion of contrast by both kidneys. Subcentimeter left renal hypodense focus is not characterized. The urinary bladder is partially distended. Probable chronic bladder wall thickening and  perivesical stranding. Correlation with urinalysis recommended to exclude UTI. Stomach/Bowel: There is sigmoid diverticulosis without definite active inflammatory changes. There is postsurgical changes of bowel with anastomotic suture in the right lower quadrant. No definite evidence of bowel obstruction. Evaluation however is limited in the absence of oral contrast. Vascular/Lymphatic: There is moderate aortoiliac atherosclerotic disease. The IVC is unremarkable as visualized. The SMV, splenic vein, and main portal vein are patent. No portal venous gas. Bulky retroperitoneal adenopathy encasing the abdominal aorta and IVC as well as enlarged bilateral iliac chain lymph nodes in keeping with history of lymphoma. Overall the degree of adenopathy is relatively similar to prior CT. Reproductive: The uterus is poorly visualized. Other: Diffuse mesenteric edema, new since the prior CT. There is scattered mesenteric adenopathy as seen previously. Musculoskeletal: Degenerative changes of the spine. Minimal compression fracture of the anterior superior and inferior endplates of the L3, new since the prior CT, likely acute or subacute. IMPRESSION: 1. Interval development of partially visualized moderate left and trace right pleural effusions, as well as development of diffuse mesenteric edema and ascites, new since the prior CT. 2. Bulky retroperitoneal and iliac chain adenopathy in keeping with history of lymphoma. Overall the degree of adenopathy is relatively similar to prior CT. 3. Mild splenomegaly. 4. Minimal compression fracture of the superior and inferior endplate of L3, likely acute or subacute. 5. Sigmoid  diverticulosis. 6. Aortic Atherosclerosis (ICD10-I70.0). Electronically Signed   By: Anner Crete M.D.   On: 06/23/2020 16:39   DG CHEST PORT 1 VIEW  Result Date: 07/16/2020 CLINICAL DATA:  86 year old female with history of shortness of breath. Pleural effusion. EXAM: PORTABLE CHEST 1 VIEW COMPARISON:   Chest x-ray 07/14/2020. FINDINGS: Right subclavian single-lumen porta cath with tip terminating at the superior cavoatrial junction. Enlarging large right pleural effusion with extensive passive atelectasis in the right mid to lower lung. Left lung is clear. No left pleural effusion. No pneumothorax. No evidence of pulmonary edema. Heart size appears normal. Aortic atherosclerosis. Surgical clips project over the left axillary region, presumably from prior left axillary nodal dissection. IMPRESSION: 1. Enlarging right pleural effusion with worsening passive atelectasis in the right lung. 2. Aortic atherosclerosis. Electronically Signed   By: Vinnie Langton M.D.   On: 07/16/2020 10:17   ECHOCARDIOGRAM COMPLETE  Result Date: 07/07/2020    ECHOCARDIOGRAM REPORT   Patient Name:   Tracy Mclaughlin Date of Exam: 07/07/2020 Medical Rec #:  [id number]            Height:       68.0 in Accession #:    [id number]           Weight:       174.0 lb Date of Birth:  [birth year]           BSA:          1.926 m Patient Age:    54 years             BP:           132/65 mmHg Patient Gender: F                    HR:           855 bpm. Exam Location:  Inpatient Procedure: 2D Echo Indications:    Atrial tachycardia [facility]) [[id number]]  History:        Patient has prior history of Echocardiogram examinations, most                 recent 05/31/2019. Risk Factors:Hypertension. Lymphoma , thyroid                 disease, thyroid cancer. Past history of afib.  Sonographer:    Darlina Sicilian RDCS Referring Phys: [place]  1. Left ventricular ejection fraction, by estimation, is 60 to 65%. The left ventricle has normal function. The left ventricle has no regional wall motion abnormalities. Left ventricular diastolic parameters were normal.  2. Right ventricular systolic function is normal. The right ventricular size is normal.  3. Moderate pleural effusion in both left and right lateral regions.  4. The mitral valve is  normal in structure. Trivial mitral valve regurgitation. No evidence of mitral stenosis.  5. The aortic valve is tricuspid. Aortic valve regurgitation is not visualized. No aortic stenosis is present.  6. The inferior vena cava is normal in size with <50% respiratory variability, suggesting right atrial pressure of 8 mmHg. Conclusion(s)/Recommendation(s): Normal biventricular function without evidence of hemodynamically significant valvular heart disease. FINDINGS  Left Ventricle: Left ventricular ejection fraction, by estimation, is 60 to 65%. The left ventricle has normal function. The left ventricle has no regional wall motion abnormalities. The left ventricular internal cavity size was normal in size. There is  no left ventricular hypertrophy. Left ventricular diastolic parameters were normal. Right Ventricle: The right ventricular  size is normal. No increase in right ventricular wall thickness. Right ventricular systolic function is normal. Left Atrium: Left atrial size was normal in size. Right Atrium: Right atrial size was normal in size. Pericardium: Trivial pericardial effusion is present. Mitral Valve: The mitral valve is normal in structure. Trivial mitral valve regurgitation. No evidence of mitral valve stenosis. Tricuspid Valve: The tricuspid valve is normal in structure. Tricuspid valve regurgitation is mild . No evidence of tricuspid stenosis. Aortic Valve: The aortic valve is tricuspid. Aortic valve regurgitation is not visualized. No aortic stenosis is present. Pulmonic Valve: The pulmonic valve was grossly normal. Pulmonic valve regurgitation is trivial. Aorta: The aortic root, ascending aorta and aortic arch are all structurally normal, with no evidence of dilitation or obstruction. Venous: The inferior vena cava is normal in size with less than 50% respiratory variability, suggesting right atrial pressure of 8 mmHg. IAS/Shunts: No atrial level shunt detected by color flow Doppler. Additional  Comments: There is a moderate pleural effusion in both left and right lateral regions.  LEFT VENTRICLE PLAX 2D LVIDd:         5.10 cm  Diastology LVIDs:         3.30 cm  LV e' lateral:   8.27 cm/s LV PW:         0.90 cm  LV E/e' lateral: 9.2 LV IVS:        0.70 cm  LV e' medial:    6.64 cm/s LVOT diam:     1.80 cm  LV E/e' medial:  11.5 LV SV:         30 LV SV Index:   15 LVOT Area:     2.54 cm  RIGHT VENTRICLE RV S prime:     14.80 cm/s TAPSE (M-mode): 2.4 cm LEFT ATRIUM           Index       RIGHT ATRIUM           Index LA diam:      3.00 cm 1.56 cm/m  RA Area:     13.80 cm LA Vol (A2C): 44.7 ml 23.21 ml/m RA Volume:   33.50 ml  17.39 ml/m LA Vol (A4C): 42.6 ml 22.12 ml/m  AORTIC VALVE LVOT Vmax:   77.70 cm/s LVOT Vmean:  49.700 cm/s LVOT VTI:    0.116 m  AORTA Ao Root diam: 3.00 cm MITRAL VALVE MV Area (PHT): 4.96 cm    SHUNTS MV Decel Time: 153 msec    Systemic VTI:  0.12 m MV E velocity: 76.30 cm/s  Systemic Diam: 1.80 cm MV A velocity: 78.80 cm/s MV E/A ratio:  0.97 Buford Dresser MD Electronically signed by Buford Dresser MD Signature Date/Time: 07/07/2020/5:02:22 PM    Final    [place] ASCITES (ABDOMEN LIMITED)  Result Date: 07/01/2020 CLINICAL DATA:  History of lymphoma, now with concern for symptomatic intra-abdominal ascites. Please perform ascites search ultrasound and ultrasound-guided paracentesis as indicated. EXAM: LIMITED ABDOMEN ULTRASOUND FOR ASCITES TECHNIQUE: Limited ultrasound survey for ascites was performed in all four abdominal quadrants. COMPARISON:  CT abdomen pelvis-06/23/2020 FINDINGS: Sonographic evaluation demonstrates a trace amount of intra-abdominal ascites, too small to allow for safe ultrasound-guided paracentesis. Note is made of small bilateral pleural effusions, the right side of which appears new compared to abdominal CT performed 06/23/2020. IMPRESSION: 1. Trace amount of intra-abdominal ascites, too small to allow for safe ultrasound-guided paracentesis. No  paracentesis attempted. 2. Small bilateral pleural effusions - the left-sided pleural effusion appears similar to  abdominal CT performed 06/23/2020 however the right-sided pleural effusion appears new of the CT. Electronically Signed   By: Sandi Mariscal M.D.   On: 07/01/2020 15:11   US Abdomen Limited RUQ  Result Date: 07/02/2020 CLINICAL DATA:  Abnormal LFTs EXAM: ULTRASOUND ABDOMEN LIMITED RIGHT UPPER QUADRANT COMPARISON:  CT 06/23/2020.  Ultrasound 07/01/2020 FINDINGS: Gallbladder: Prior cholecystectomy Common bile duct: Diameter: Prominent measuring up to 10 mm, likely related to post cholecystectomy state. Liver: Heterogeneous, slightly increased echotexture. Subtle nodular contours to the liver surface. Findings raise the possibility of cirrhosis. No suspicious focal hepatic abnormality. Portal vein is patent on color Doppler imaging with normal direction of blood flow towards the liver. Other: Ascites and right effusion noted. Prominent porta hepatis lymph nodes. IMPRESSION: Heterogeneous, increased echotexture throughout the liver. Subtle nodular contour seen in some areas of the liver. Findings suggest the possibility of cirrhosis. Perihepatic ascites and right pleural effusion. Mild biliary ductal dilatation, likely related to post cholecystectomy state. Prominent porta hepatis lymph nodes, likely related to liver disease. Electronically Signed   By: Rolm Baptise M.D.   On: 07/02/2020 08:52   US THORACENTESIS ASP PLEURAL SPACE W/IMG GUIDE  Result Date: 07/08/2020 INDICATION: Lymphoma, remote colon and thyroid cancers, dyspnea, right pleural effusion.request for diagnostic and therapeutic thoracentesis. EXAM: ULTRASOUND GUIDED RIGHT THORACENTESIS MEDICATIONS: 1% lidocaine 10 mL COMPLICATIONS: None immediate. PROCEDURE: An ultrasound guided thoracentesis was thoroughly discussed with the patient and questions answered. The benefits, risks, alternatives and complications were also discussed. The patient  understands and wishes to proceed with the procedure. Written consent was obtained. Ultrasound was performed to localize and mark an adequate pocket of fluid in the right chest. The area was then prepped and draped in the normal sterile fashion. 1% Lidocaine was used for local anesthesia. Under ultrasound guidance a 6 Fr Safe-T-Centesis catheter was introduced. Thoracentesis was performed. The catheter was removed and a dressing applied. FINDINGS: A total of approximately 500 mL of red fluid was removed. Samples were sent to the laboratory as requested by the clinical team. IMPRESSION: Successful ultrasound guided right thoracentesis yielding 500 mL of pleural fluid. Read by: Gareth Eagle, PA-C Electronically Signed   By: Jerilynn Mages.  Shick M.D.   On: 07/08/2020 16:31   US THORACENTESIS ASP PLEURAL SPACE W/IMG GUIDE  Result Date: 07/04/2020 INDICATION: Patient with history of lymphoma, remote colon and thyroid cancers, dyspnea, right pleural effusion. Request made for diagnostic and therapeutic right thoracentesis. EXAM: ULTRASOUND GUIDED DIAGNOSTIC AND THERAPEUTIC RIGHT THORACENTESIS MEDICATIONS: 1% lidocaine to skin and subcutaneous tissue COMPLICATIONS: None immediate. PROCEDURE: An ultrasound guided thoracentesis was thoroughly discussed with the patient and questions answered. The benefits, risks, alternatives and complications were also discussed. The patient understands and wishes to proceed with the procedure. Written consent was obtained. Ultrasound was performed to localize and mark an adequate pocket of fluid in the right chest. The area was then prepped and draped in the normal sterile fashion. 1% Lidocaine was used for local anesthesia. Under ultrasound guidance a 6 Fr Safe-T-Centesis catheter was introduced. Thoracentesis was performed. The catheter was removed and a dressing applied. FINDINGS: A total of approximately 1.6 liters of blood-tinged fluid was removed. Samples were sent to the laboratory as  requested by the clinical team. IMPRESSION: Successful ultrasound guided diagnostic and therapeutic right thoracentesis yielding 1.6 liters of pleural fluid. Read by: Rowe Robert, PA-C Electronically Signed   By: Lucrezia Europe M.D.   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 Status post weekly Rituxan x4 03/01/2012 through 03/22/2012. She completed 4 "maintenance" doses of Rituxan, last on 12/19/2012. A restaging CT on 02/09/2013 showed no evidence of lymphoma.   Lymph node lateral to the thyroid bed on a neck ultrasound 02/21/2014, status post an FNA biopsy concerning for a lymphoproliferative disorder.  PET scan 09/28/2016 with active lymphoma within the neck, chest, abdomen, pelvis; splenic enlargement and hypermetabolism suspicious for splenic involvement.  Initiation of Rituxan weekly 4 09/29/2016  Initiation of maintenance Rituxan on a 3 month schedule 12/23/2016; final Rituxan 08/31/2018  Thyroid ultrasound 02/07/2019-left cervical lymphadenopathy  PET scan 03/08/2019-extensive recurrent hypermetabolic lymphoma involving the neck, chest, abdomen and pelvis.  03/16/2019 left cervical lymph node biopsy-features consistent with previously diagnosed non-Hodgkin's B-cell lymphoma, phenotypically consistent with marginal zone lymphoma. Flow cytometry with lambda restricted B-cell population without expression of CD5 or CD10 comprising 87% of all lymphocytes.  Cycle 1 bendamustine/Rituxan 03/22/2019  Excision deep left axillary lymph nodes 05/04/2019-non-Hodgkin's B-cell lymphoma with differential including a marginal zone lymphoma and atypical small lymphocytic lymphoma. Flow cytometry with monoclonal B-cell population without expression of CD5 or CD10, comprises 96% of all lymphocytes.  Cycle 1 [facility]/Rituxan 05/18/2019  Cycle 2 [facility]/Rituxan  06/08/2019  CTs 06/15/2019-partial improvement in diffuse adenopathy, stable mild splenomegaly  Cycle 1 Revlimid/rituximab 07/19/2019 (Revlimid start 07/20/2019)  Cycle 2 Revlimid/rituximab 08/16/2019 (Revlimid placed on hold 08/30/2019 due to neutropenia)  Cycle 3 of Revlimid/rituximab 09/13/2019 (Revlimid schedule changed to 14 days on/14 days off)  Cycle 4 Revlimid/rituximab 10/10/2019  Cycle 5 Revlimid/rituximab 11/07/2019  Cycle 6 Revlimid/rituximab 12/07/2019  CTs 12/28/2019-diffuse lymphadenopathy-slightly increased compared to 06/15/2019  Cycle 7 Revlimid/rituximab 01/04/2020  Cycle 8 Revlimid/Rituxan 02/01/2020  Cycle 9 Revlimid/Rituxan 03/05/2020  Cycle 10 Revlimid/Rituxan 04/02/2020  Cycle 11 Revlimid/Rituxan 04/30/2020  CT 05/22/2020-mild increase in left supraclavicular adenopathy, mild increase in chest, retroperitoneal, and pelvic adenopathy. Stable mild splenomegaly.  Acalabrutinib started 06/19/2020  CT abdomen/pelvis 06/23/2020-moderate left and trace right pleural effusions, new diffuse mesenteric edema and ascites, stable retroperitoneal and iliac adenopathy, mild splenomegaly 2. Stage IV (T1bN1b M0) papillary thyroid cancer, status post a thyroidectomy with reimplantation of the left superior parathyroid gland on 05/23/2012, status post radioactive iodine therapy, followed by Dr. Buddy Duty.  3. Stage II (T3 N0) colon cancer, status post a right colectomy 10/19/2011, last colonoscopy April 2015-sigmoid adenoma removed.  4. History of a pulmonary embolism December 2012.  5. History of Atrial fibrillation 6. Iron deficiency anemia-new 03/18/2014. Hemoccult positive stool. The anemia corrected with iron. No longer taking iron.  Status post an upper endoscopy and colonoscopy by Dr. Carlean Purl April 2015 with no bleeding source identified, benign adenoma removed from the sigmoid colon. 7. Report of an upper gastric intestinal bleed fall 2016-managed in [place]. [name] 8. Left knee  replacement May 2017, repeat left knee surgery May 2018 9. Pruritic rash 07/22/2016 10. Nausea and diarrhea 04/02/2019-stool negative for C. difficile toxin 11. 06/18/2019 [facility] admission for symptomatic anemia. 12.  Admission 06/23/2020 with nausea and diarrhea 13.  Respiratory failure, bilateral pleural effusions  Right thoracentesis 07/04/2020-culture and cytology negative  CT chest 07/07/2020-right and left pleural effusions, bilateral basilar atelectasis, interlobular septal thickening in the right lung-asymmetric edema?  Improved adenopathy  Right thoracentesis 07/08/2020-flow cytometry with a small clonal B-cell population  Tracy Mclaughlin continues to have dyspnea and generalized weakness.  Chest x-ray today shows reaccumulation of the right pleural effusion.  It is unclear whether the effusion is  related to lymphoma, infection, or another etiology.  Recommend for pulmonary medicine to reevaluate her to consider the etiology of respiratory failure and effusions.  Will defer to them as to whether she needs a repeat thoracentesis or potentially a Pleurx catheter.  Her lymph nodes are enlarging due to her underlying lymphoma.  We will restart her acalabrutinib.  She is not ready for discharge to home or a skilled nursing facility at present.  The patient will likely need SNF prior to discharge.  [place]   Recommendations: 1.  Resume acalabrutinib 100 mg daily.  Order has been entered. 2.  Continue to attempt to wean O2 as tolerated. 3.  Pulmonary medicine consultation 4.  Repeat right thoracentesis if pulmonary medicine agrees  5.  Follow liver enzymes while on acalabrutinib   LOS: 23 days   Mikey Bussing 07/17/20  Tracy Mclaughlin was interviewed and examined.  She continues to have respiratory failure requiring oxygen support.  The etiology of the respiratory failure remains unclear.  She will undergo a repeat thoracentesis with fluid to be sent for  flow cytometry, cytology, and culture.  The left neck lymph nodes appear larger over the past several days.  The plan is to resume  acalabrutinib with close follow-up of the liver enzymes.  I discussed the prognosis with Tracy Mclaughlin.  She understands treatment options will be limited if lymphoma is responsible for the respiratory failure.  I will recommend hospice care if she does not improve with acalabrutinib.

## 2020-07-17 NOTE — Progress Notes (Signed)
CRITICAL VALUE ALERT  Critical Value: Vancomycin Trough 24  Date & Time Notied:  07/17/20 @ 0636  Provider Notified: M. Sharlet Salina @ [phone]  Orders Received/Actions taken: Awaiting orders

## 2020-07-18 DIAGNOSIS — J90 Pleural effusion, not elsewhere classified: Secondary | ICD-10-CM | POA: Diagnosis not present

## 2020-07-18 LAB — PATHOLOGIST SMEAR REVIEW

## 2020-07-18 LAB — CBC
HCT: 32.3 % — ABNORMAL LOW (ref 36.0–46.0)
Hemoglobin: 10.4 g/dL — ABNORMAL LOW (ref 12.0–15.0)
MCH: 33.8 pg (ref 26.0–34.0)
MCHC: 32.2 g/dL (ref 30.0–36.0)
MCV: 104.9 fL — ABNORMAL HIGH (ref 80.0–100.0)
Platelets: 148 10*3/uL — ABNORMAL LOW (ref 150–400)
RBC: 3.08 MIL/uL — ABNORMAL LOW (ref 3.87–5.11)
RDW: 14.6 % (ref 11.5–15.5)
WBC: 6.2 10*3/uL (ref 4.0–10.5)
nRBC: 0 % (ref 0.0–0.2)

## 2020-07-18 LAB — COMPREHENSIVE METABOLIC PANEL
ALT: 29 U/L (ref 0–44)
AST: 53 U/L — ABNORMAL HIGH (ref 15–41)
Albumin: 2.6 g/dL — ABNORMAL LOW (ref 3.5–5.0)
Alkaline Phosphatase: 147 U/L — ABNORMAL HIGH (ref 38–126)
Anion gap: 9 (ref 5–15)
BUN: 12 mg/dL (ref 8–23)
CO2: 33 mmol/L — ABNORMAL HIGH (ref 22–32)
Calcium: 7.8 mg/dL — ABNORMAL LOW (ref 8.9–10.3)
Chloride: 94 mmol/L — ABNORMAL LOW (ref 98–111)
Creatinine, Ser: 0.76 mg/dL (ref 0.44–1.00)
GFR calc Af Amer: >60 mL/min (ref >60)
GFR calc non Af Amer: >60 mL/min (ref >60)
Glucose, Bld: 114 mg/dL — ABNORMAL HIGH (ref 70–99)
Potassium: 4.5 mmol/L (ref 3.5–5.1)
Sodium: 136 mmol/L (ref 135–145)
Total Bilirubin: 1.3 mg/dL — ABNORMAL HIGH (ref 0.3–1.2)
Total Protein: 5.2 g/dL — ABNORMAL LOW (ref 6.5–8.1)

## 2020-07-18 NOTE — Progress Notes (Signed)
Physical Therapy Treatment Patient Details Name: Tracy Mclaughlin MRN: [id number] DOB: 20-Jun-1934 Today's Date: 07/18/2020    History of Present Illness 86 yo female admitted with N/V/D, abd pain, UTI, ascites. Hx of NHL, hypothyroidism, A fib, anemia, OA, PE, SAH    PT Comments    Pt in bed with room dark.  Required MAX encouragement to get OOB.  Pt able to transition self to recliner with Min Assist but VERY WEAK.  LIMITED activity tolerance.  VERY deconditioned with extended length of stay and poor PO intake.  Pt only able to take a few steps to recliner before c/o "giving out".  Positioned in recliner to comfort.     Follow Up Recommendations  SNF;Supervision/Assistance - 24 hour     Equipment Recommendations  Wheelchair (measurements PT)    Recommendations for Other Services       Precautions / Restrictions Precautions Precautions: Fall Precaution Comments: monitor O2,HR Restrictions Weight Bearing Restrictions: No    Mobility  Bed Mobility Overal bed mobility: Needs Assistance Bed Mobility: Supine to Sit     Supine to sit: Min assist     General bed mobility comments: Min assist for LLE to transfer into sittnig at side of bed. Patient used bed rail for assistance.  Transfers Overall transfer level: Needs assistance Equipment used: Rolling walker (2 wheeled) Transfers: Sit to/from [facility] Sit to Stand: Min assist;From elevated surface;Min guard Stand pivot transfers: Min assist       General transfer comment: assisted from elevated bed to recliner  Ambulation/Gait Ambulation/Gait assistance: Min assist Gait Distance (Feet): 1 Feet Assistive device: Rolling walker (2 wheeled) Gait Pattern/deviations: Trunk flexed Gait velocity: decr   General Gait Details: Pt took a few steps over to recliner pt was able to support herself and complete turn as well as back step.  MAX c/o weakness and fatigue.  VERY LIMITED activity  tolerance.   Stairs             Wheelchair Mobility    Modified Rankin (Stroke Patients Only)       Balance                                            Cognition Arousal/Alertness: Awake/alert Behavior During Therapy: Flat affect Overall Cognitive Status: Within Functional Limits for tasks assessed                                 General Comments: pt continues to require encouragment to mobilize/spend time OOB "to weak to care" stated pt      Exercises      General Comments        Pertinent Vitals/Pain Pain Assessment: No/denies pain    Home Living                      Prior Function            PT Goals (current goals can now be found in the care plan section) Progress towards PT goals: Progressing toward goals    Frequency    Min 3X/week      PT Plan Current plan remains appropriate    Co-evaluation              AM-PAC PT "6 Clicks" Mobility   Outcome Measure  Help needed turning from your back to your side while in a flat bed without using bedrails?: A Little Help needed moving from lying on your back to sitting on the side of a flat bed without using bedrails?: A Lot Help needed moving to and from a bed to a chair (including a wheelchair)?: A Lot Help needed standing up from a chair using your arms (e.g., wheelchair or bedside chair)?: A Lot Help needed to walk in hospital room?: A Lot Help needed climbing 3-5 steps with a railing? : Total 6 Click Score: 12    End of Session Equipment Utilized During Treatment: Gait belt Activity Tolerance: Patient limited by fatigue Patient left: in chair;with call bell/phone within reach;with chair alarm set;with family/visitor present Nurse Communication: Mobility status PT Visit Diagnosis: Muscle weakness (generalized) (M62.81);Difficulty in walking, not elsewhere classified (R26.2);Unsteadiness on feet (R26.81)     Time: 9371-6967 PT Time  Calculation (min) (ACUTE ONLY): 23 min  Charges:  $Therapeutic Activity: 23-37 mins                     Rica Koyanagi  PTA  Acute  Rehabilitation Services  Pager      [phone] Office      [phone]

## 2020-07-18 NOTE — Progress Notes (Signed)
OT Cancellation Note  Patient Details Name: Tracy Mclaughlin MRN: [id number] DOB: 1934/08/14   Cancelled Treatment:    Reason Eval/Treat Not Completed: Patient declined, no reason specified Patient reports a long visit with her friend and that she is too tired.  Lenward Chancellor 07/18/2020, 4:37 PM

## 2020-07-18 NOTE — Progress Notes (Addendum)
HEMATOLOGY-ONCOLOGY PROGRESS NOTE  SUBJECTIVE: Resting quietly this morning.  Offers no specific complaints.  Status post right thoracentesis 07/17/2020 with 1400 cc removed.  Fluid sent for cytogenetics, flow, cell counts, and infection analysis.  Now off O2.  Oncology History  Non Hodgkin's lymphoma ([place])  09/29/2016 Initial Diagnosis   Non Hodgkin's lymphoma ([place])   03/22/2019 - 04/23/2019 Chemotherapy   The patient had palonosetron (ALOXI) injection 0.25 mg, 0.25 mg, Intravenous,  Once, 1 of 4 cycles Administration: 0.25 mg (03/22/2019) riTUXimab (RITUXAN) 800 mg in sodium chloride 0.9 % 250 mL (2.4242 mg/mL) infusion, 375 mg/m2 = 800 mg, Intravenous,  Once, 1 of 4 cycles Administration: 800 mg (03/22/2019) bendamustine (BENDEKA) 150 mg in sodium chloride 0.9 % 50 mL (2.6786 mg/mL) chemo infusion, 70 mg/m2 = 150 mg (100 % of original dose 70 mg/m2), Intravenous,  Once, 1 of 4 cycles Dose modification: 70 mg/m2 (original dose 70 mg/m2, Cycle 1, Reason: Provider Judgment) Administration: 150 mg (03/22/2019), 150 mg (03/23/2019)  for chemotherapy treatment.    05/18/2019 - 06/28/2019 Chemotherapy   The patient had DOXOrubicin (ADRIAMYCIN) chemo injection 82 mg, 40 mg/m2 = 82 mg (100 % of original dose 40 mg/m2), Intravenous,  Once, 2 of 6 cycles Dose modification: 40 mg/m2 (original dose 40 mg/m2, Cycle 1, Reason: Provider Judgment) Administration: 82 mg (05/18/2019), 82 mg (06/08/2019) palonosetron (ALOXI) injection 0.25 mg, 0.25 mg, Intravenous,  Once, 2 of 6 cycles Administration: 0.25 mg (05/18/2019), 0.25 mg (06/08/2019) pegfilgrastim (NEULASTA ONPRO KIT) injection 6 mg, 6 mg, Subcutaneous, Once, 2 of 6 cycles Administration: 6 mg (05/18/2019), 6 mg (06/08/2019) vinCRIStine (ONCOVIN) 1 mg in sodium chloride 0.9 % 50 mL chemo infusion, 1 mg (100 % of original dose 1 mg), Intravenous,  Once, 2 of 6 cycles Dose modification: 1 mg (original dose 1 mg, Cycle 1, Reason: Provider Judgment) Administration: 1 mg  (05/18/2019), 1 mg (06/08/2019) riTUXimab (RITUXAN) 800 mg in sodium chloride 0.9 % 250 mL (2.4242 mg/mL) infusion, 375 mg/m2 = 800 mg, Intravenous,  Once, 2 of 6 cycles Administration: 800 mg (05/18/2019), 800 mg (06/08/2019) cyclophosphamide (CYTOXAN) 1,240 mg in sodium chloride 0.9 % 250 mL chemo infusion, 600 mg/m2 = 1,540 mg, Intravenous,  Once, 2 of 6 cycles Dose modification: 600 mg/m2 (original dose 750 mg/m2, Cycle 2, Reason: Provider Judgment) Administration: 1,240 mg (05/18/2019), 1,240 mg (06/08/2019) fosaprepitant (EMEND) 150 mg, dexamethasone (DECADRON) 12 mg in sodium chloride 0.9 % 145 mL IVPB, , Intravenous,  Once, 2 of 6 cycles Administration:  (05/18/2019),  (06/08/2019)  for chemotherapy treatment.    07/19/2019 -  Chemotherapy   The patient had riTUXimab (RITUXAN) 700 mg in sodium chloride 0.9 % 250 mL (2.1875 mg/mL) infusion, 375 mg/m2 = 700 mg, Intravenous,  Once, 11 of 11 cycles Administration: 700 mg (07/19/2019), 700 mg (08/16/2019), 700 mg (09/13/2019), 700 mg (10/10/2019), 700 mg (11/07/2019), 700 mg (12/07/2019), 700 mg (01/04/2020), 700 mg (02/01/2020), 700 mg (03/05/2020), 700 mg (04/02/2020), 700 mg (04/30/2020) riTUXimab-pvvr (RUXIENCE) 700 mg in sodium chloride 0.9 % 250 mL (2.1875 mg/mL) infusion, 375 mg/m2 = 700 mg (original dose ), Intravenous,  Once, 0 of 1 cycle Dose modification: 375 mg/m2 (Cycle 12)  for chemotherapy treatment.     PHYSICAL EXAMINATION:  Vitals:   07/18/20 0900 07/18/20 0902  BP: (!) 86/41 (!) 140/63  Pulse: (!) 115 89  Resp:    Temp:    SpO2:     Filed Weights   07/12/20 0500 07/15/20 0500 07/16/20 0614  Weight: 71.1 kg 65.5  kg 68.8 kg    Intake/Output from previous day: 07/29 0701 - 07/30 0700 In: 700 [P.O.:700] Out: 900 [Urine:900]  GENERAL: Appears very weak, alert HEENT: No thrush, mouth is dry LYMPH: Soft mobile left cervical and left axillary nodes, stable today LUNGS: Increased respiratory rate, improved aeration on the  right HEART: Regular rate and rhythm ABDOMEN: Nontender, spleen tip palpable in the left subcostal region NEURO: Alert and oriented x3 Vascular: No leg edema  LABORATORY DATA:  I have reviewed the data as listed CMP Latest Ref Rng & Units 07/18/2020 07/17/2020 07/16/2020  Glucose 70 - 99 mg/dL 114(H) 98 104(H)  BUN 8 - 23 mg/dL _0 Creatinine 0.44 - 1.00 mg/dL 0.76 0.66 0.80  Sodium 135 - 145 mmol/L 136 140 138  Potassium 3.5 - 5.1 mmol/L 4.5 4.0 2.5(LL)  Chloride 98 - 111 mmol/L 94(L) 91(L) 88(L)  CO2 22 - 32 mmol/L 33(H) 37(H) 37(H)  Calcium 8.9 - 10.3 mg/dL 7.8(L) 7.7(L) 7.2(L)  Total Protein 6.5 - 8.1 g/dL 5.2(L) 4.9(L) -  Total Bilirubin 0.3 - 1.2 mg/dL 1.3(H) 1.1 -  Alkaline Phos 38 - 126 U/L 147(H) 143(H) -  AST 15 - 41 U/L 53(H) 55(H) -  ALT 0 - 44 U/L 29 31 -    Lab Results  Component Value Date   WBC 6.2 07/18/2020   HGB 10.4 (L) 07/18/2020   HCT 32.3 (L) 07/18/2020   MCV 104.9 (H) 07/18/2020   PLT 148 (L) 07/18/2020   NEUTROABS 2.8 06/23/2020    DG Chest 1 View  Result Date: 07/08/2020 CLINICAL DATA:  86 year old female status post right thoracentesis. EXAM: CHEST  1 VIEW COMPARISON:  Chest radiograph dated 07/06/2020. FINDINGS: Right-sided Port-A-Cath in similar position. Bilateral patchy airspace opacities, right greater left with interval progression compared to prior radiograph may represent edema or pneumonia. Clinical correlation is recommended. Small bilateral pleural effusions. No pneumothorax. Stable cardiac silhouette. Atherosclerotic calcification of the aorta. No acute osseous pathology. IMPRESSION: 1. Small bilateral pleural effusions.  No pneumothorax. 2. Bilateral airspace opacities, progressed since the prior radiograph. Electronically Signed   By: Anner Crete M.D.   On: 07/08/2020 18:26   DG Chest 1 View  Result Date: 07/04/2020 CLINICAL DATA:  Status post right thoracentesis. EXAM: CHEST  1 VIEW COMPARISON:  Chest x-ray from yesterday.  FINDINGS: Unchanged right chest wall port catheter. The heart size and mediastinal contours are within normal limits. Normal pulmonary vascularity. Trace residual right pleural effusion status post thoracentesis. Improved aeration at the right lung base. No pneumothorax. Unchanged small left pleural effusion and basilar atelectasis. No acute osseous abnormality. IMPRESSION: 1. Trace residual right pleural effusion status post thoracentesis. No pneumothorax. 2. Unchanged small left pleural effusion and basilar atelectasis. Electronically Signed   By: Titus Dubin M.D.   On: 07/04/2020 11:42   DG Chest 2 View  Result Date: 07/14/2020 CLINICAL DATA:  Dyspnea. EXAM: CHEST - 2 VIEW COMPARISON:  07/09/2020 FINDINGS: Right chest wall port a catheter is noted with tip at the cavoatrial junction. Moderate right pleural effusion the with [place] opacification of the right lower lobe is again noted. When compared with the previous exam there is improved aeration to the right and left midlung zones and left lower lobe. IMPRESSION: 1. Improved aeration to the right and left lower lobes. 2. Persistent right pleural effusion. Electronically Signed   By: Kerby Moors M.D.   On: 07/14/2020 12:16   DG Chest 2 View  Result Date: 07/09/2020 CLINICAL DATA:  Follow-up pleural effusion. EXAM: CHEST - 2 VIEW COMPARISON:  07/08/20 FINDINGS: Right chest wall port a catheter is noted with tip projecting over the cavoatrial junction. Interval increase in volume of right pleural effusion. Mild diffuse pulmonary edema. Bilateral airspace opacities are noted within the left upper lobe, right midlung and right base concerning for multifocal pneumonia. IMPRESSION: 1. Increase in volume of right pleural effusion with mild interstitial edema 2. Bilateral airspace opacities concerning for multifocal pneumonia. Electronically Signed   By: Signa Kell M.D.   On: 07/09/2020 11:21   DG Chest 2 View  Result Date: 07/06/2020 CLINICAL DATA:   Worsening shortness of breath. EXAM: CHEST - 2 VIEW COMPARISON:  07/04/2020 FINDINGS: The cardiac silhouette remains grossly normal in size, spur partially obscured by significantly increased patchy opacity in the right lower lung zone. Small amount of posterior pleural fluid. Mild linear density at the left lung base. Right subclavian porta catheter tip in the region of the superior cavoatrial junction. Unremarkable bones. IMPRESSION: 1. Significantly increased right lower lung zone pneumonia. 2. Small amount of posterior pleural fluid. 3. Mild left basilar atelectasis. Electronically Signed   By: Beckie Salts M.D.   On: 07/06/2020 16:24   DG Chest 2 View  Result Date: 07/03/2020 CLINICAL DATA:  Dyspnea, pleural effusions EXAM: CHEST - 2 VIEW COMPARISON:  06/26/2020 FINDINGS: Frontal and lateral views of the chest demonstrates stable right chest wall port. Cardiac silhouette is unremarkable. There is now a moderate right pleural effusion with right basilar consolidation. Decreased left pleural effusion, with only trace fluid at the left costophrenic angle. Persistent retrocardiac consolidation. No pneumothorax. No acute bony abnormalities. IMPRESSION: 1. Decreased left pleural effusion, with persistent left basilar consolidation. 2. Increasing right pleural effusion, with increasing right basilar consolidation. Electronically Signed   By: Sharlet Salina M.D.   On: 07/03/2020 16:11   DG Chest 2 View  Result Date: 06/26/2020 CLINICAL DATA:  Dyspnea. EXAM: CHEST - 2 VIEW COMPARISON:  Chest CT 05/22/2020 FINDINGS: Right chest port remains in place. Hazy opacity at the left lung base consistent with pleural effusion and airspace disease/atelectasis. Right lung is clear. Upper normal heart size. Mild right hilar prominence likely secondary to combination of vascular structures and adenopathy is seen on recent CT. Fullness in the upper mediastinum likely secondary to adenopathy. No pneumothorax. No pulmonary edema.  Surgical clips in the left axilla. IMPRESSION: 1. Hazy opacity at the left lung base consistent with pleural effusion and airspace disease/atelectasis. 2. Mild right hilar prominence likely secondary to combination of vascular structures and adenopathy seen on recent CT. Electronically Signed   By: Narda Rutherford M.D.   On: 06/26/2020 15:54   CT ANGIO CHEST PE W OR WO CONTRAST  Result Date: 07/07/2020 CLINICAL DATA:  Lymphoma, recurrent right pleural effusion bloody tap EXAM: CT ANGIOGRAPHY CHEST WITH CONTRAST TECHNIQUE: Multidetector CT imaging of the chest was performed using the standard protocol during bolus administration of intravenous contrast. Multiplanar CT image reconstructions and MIPs were obtained to evaluate the vascular anatomy. CONTRAST:  OMNIPAQUE IOHEXOL 350 MG/ML SOLN COMPARISON:  None. FINDINGS: Cardiovascular: Aortic atherosclerosis. Normal heart size. Central pulmonary arteries are patent. Mediastinum/Nodes: Adenopathy is identified in the supraclavicular region, bilateral axilla, and mediastinum. Nodes appear and measures slightly smaller in size. Lungs/Pleura: Moderate right and mild to moderate left pleural effusions measuring simple fluid in density. Imaging performed during expiration. Persistent atelectasis at the left lung base. New atelectasis/consolidation at the right lung base. Interlobular septal thickening the non  dependent right lung may reflect asymmetric edema. Upper Abdomen: Ascites. Unchanged ill-defined hypoattenuating lesion in the superior spleen. Musculoskeletal: No new abnormality. Review of the MIP images confirms the above findings. IMPRESSION: Moderate right and mild to moderate left pleural effusions. Right basilar atelectasis/consolidation and left basilar atelectasis. Interlobular septal thickening in the right lung may reflect asymmetric edema. Multifocal adenopathy appears stable to slightly improved. Electronically Signed   By: Macy Mis M.D.    On: 07/07/2020 14:33   CT Abdomen Pelvis W Contrast  Result Date: 06/23/2020 CLINICAL DATA:  86 year old female with abdominal pain, nausea and vomiting. History of lymphoma. EXAM: CT ABDOMEN AND PELVIS WITH CONTRAST TECHNIQUE: Multidetector CT imaging of the abdomen and pelvis was performed using the standard protocol following bolus administration of intravenous contrast. CONTRAST:  30m OMNIPAQUE IOHEXOL 300 MG/ML  SOLN COMPARISON:  CT of the chest abdomen pelvis dated 05/22/2020. FINDINGS: Lower chest: Partially visualized moderate left and trace right pleural effusions, new since the prior CT. There is associated partial compressive atelectasis of the left lower lobe. Pneumonia is not excluded. Clinical correlation is recommended. No intra-abdominal free air. Interval development of a small ascites, new or significantly increased since the prior CT. Hepatobiliary: A 1 cm hypodense focus along the posterior liver capsule is not characterized but may represent a cyst or focal area of scarring. This is similar to prior CT. Subcentimeter hypodense focus in the anterior liver is too small to characterize. There is mild intrahepatic biliary ductal dilatation, likely post cholecystectomy. The common bile duct is dilated measuring 14 mm. No retained calcified stone noted in the central CBD. Pancreas: The pancreas is unremarkable as visualized. Spleen: Mildly enlarged spleen measuring 15 cm in craniocaudal length. Adrenals/Urinary Tract: The adrenal glands are poorly visualized but grossly unremarkable. There is mild fullness of the renal collecting systems bilaterally without frank hydronephrosis. Mild hazy appearance of the urothelium noted. Correlation with urinalysis recommended to exclude UTI. There is symmetric enhancement and excretion of contrast by both kidneys. Subcentimeter left renal hypodense focus is not characterized. The urinary bladder is partially distended. Probable chronic bladder wall thickening  and perivesical stranding. Correlation with urinalysis recommended to exclude UTI. Stomach/Bowel: There is sigmoid diverticulosis without definite active inflammatory changes. There is postsurgical changes of bowel with anastomotic suture in the right lower quadrant. No definite evidence of bowel obstruction. Evaluation however is limited in the absence of oral contrast. Vascular/Lymphatic: There is moderate aortoiliac atherosclerotic disease. The IVC is unremarkable as visualized. The SMV, splenic vein, and main portal vein are patent. No portal venous gas. Bulky retroperitoneal adenopathy encasing the abdominal aorta and IVC as well as enlarged bilateral iliac chain lymph nodes in keeping with history of lymphoma. Overall the degree of adenopathy is relatively similar to prior CT. Reproductive: The uterus is poorly visualized. Other: Diffuse mesenteric edema, new since the prior CT. There is scattered mesenteric adenopathy as seen previously. Musculoskeletal: Degenerative changes of the spine. Minimal compression fracture of the anterior superior and inferior endplates of the L3, new since the prior CT, likely acute or subacute. IMPRESSION: 1. Interval development of partially visualized moderate left and trace right pleural effusions, as well as development of diffuse mesenteric edema and ascites, new since the prior CT. 2. Bulky retroperitoneal and iliac chain adenopathy in keeping with history of lymphoma. Overall the degree of adenopathy is relatively similar to prior CT. 3. Mild splenomegaly. 4. Minimal compression fracture of the superior and inferior endplate of L3, likely acute or subacute. 5. Sigmoid  diverticulosis. 6. Aortic Atherosclerosis (ICD10-I70.0). Electronically Signed   By: Anner Crete M.D.   On: 06/23/2020 16:39   DG CHEST PORT 1 VIEW  Result Date: 07/17/2020 CLINICAL DATA:  Status post thoracentesis. EXAM: PORTABLE CHEST 1 VIEW COMPARISON:  Radiograph yesterday. FINDINGS: Decreased  size of right pleural effusion post thoracentesis. Small residual at the right lung base. No visualized pneumothorax. Right chest port remains in place. Heart is normal in size. Left lung is clear without focal abnormality. No significant left pleural effusion. IMPRESSION: Decreased size of right pleural effusion post thoracentesis, small residual at the right lung base. No visualized pneumothorax. Electronically Signed   By: Keith Rake M.D.   On: 07/17/2020 17:36   DG CHEST PORT 1 VIEW  Result Date: 07/16/2020 CLINICAL DATA:  86 year old female with history of shortness of breath. Pleural effusion. EXAM: PORTABLE CHEST 1 VIEW COMPARISON:  Chest x-ray 07/14/2020. FINDINGS: Right subclavian single-lumen porta cath with tip terminating at the superior cavoatrial junction. Enlarging large right pleural effusion with extensive passive atelectasis in the right mid to lower lung. Left lung is clear. No left pleural effusion. No pneumothorax. No evidence of pulmonary edema. Heart size appears normal. Aortic atherosclerosis. Surgical clips project over the left axillary region, presumably from prior left axillary nodal dissection. IMPRESSION: 1. Enlarging right pleural effusion with worsening passive atelectasis in the right lung. 2. Aortic atherosclerosis. Electronically Signed   By: Vinnie Langton M.D.   On: 07/16/2020 10:17   ECHOCARDIOGRAM COMPLETE  Result Date: 07/07/2020    ECHOCARDIOGRAM REPORT   Patient Name:   Tracy Mclaughlin Date of Exam: 07/07/2020 Medical Rec #:  [id number]            Height:       68.0 in Accession #:    [id number]           Weight:       174.0 lb Date of Birth:  March 15, 1934           BSA:          1.926 m Patient Age:    86 years             BP:           132/65 mmHg Patient Gender: F                    HR:           855 bpm. Exam Location:  Inpatient Procedure: 2D Echo Indications:    Atrial tachycardia [facility]) [[id number]]  History:        Patient has prior history of  Echocardiogram examinations, most                 recent 05/31/2019. Risk Factors:Hypertension. Lymphoma , thyroid                 disease, thyroid cancer. Past history of afib.  Sonographer:    Darlina Sicilian RDCS Referring Phys: [place]  1. Left ventricular ejection fraction, by estimation, is 60 to 65%. The left ventricle has normal function. The left ventricle has no regional wall motion abnormalities. Left ventricular diastolic parameters were normal.  2. Right ventricular systolic function is normal. The right ventricular size is normal.  3. Moderate pleural effusion in both left and right lateral regions.  4. The mitral valve is normal in structure. Trivial mitral valve regurgitation. No evidence of mitral stenosis.  5. The aortic valve is tricuspid.  Aortic valve regurgitation is not visualized. No aortic stenosis is present.  6. The inferior vena cava is normal in size with <50% respiratory variability, suggesting right atrial pressure of 8 mmHg. Conclusion(s)/Recommendation(s): Normal biventricular function without evidence of hemodynamically significant valvular heart disease. FINDINGS  Left Ventricle: Left ventricular ejection fraction, by estimation, is 60 to 65%. The left ventricle has normal function. The left ventricle has no regional wall motion abnormalities. The left ventricular internal cavity size was normal in size. There is  no left ventricular hypertrophy. Left ventricular diastolic parameters were normal. Right Ventricle: The right ventricular size is normal. No increase in right ventricular wall thickness. Right ventricular systolic function is normal. Left Atrium: Left atrial size was normal in size. Right Atrium: Right atrial size was normal in size. Pericardium: Trivial pericardial effusion is present. Mitral Valve: The mitral valve is normal in structure. Trivial mitral valve regurgitation. No evidence of mitral valve stenosis. Tricuspid Valve: The tricuspid valve is  normal in structure. Tricuspid valve regurgitation is mild . No evidence of tricuspid stenosis. Aortic Valve: The aortic valve is tricuspid. Aortic valve regurgitation is not visualized. No aortic stenosis is present. Pulmonic Valve: The pulmonic valve was grossly normal. Pulmonic valve regurgitation is trivial. Aorta: The aortic root, ascending aorta and aortic arch are all structurally normal, with no evidence of dilitation or obstruction. Venous: The inferior vena cava is normal in size with less than 50% respiratory variability, suggesting right atrial pressure of 8 mmHg. IAS/Shunts: No atrial level shunt detected by color flow Doppler. Additional Comments: There is a moderate pleural effusion in both left and right lateral regions.  LEFT VENTRICLE PLAX 2D LVIDd:         5.10 cm  Diastology LVIDs:         3.30 cm  LV e' lateral:   8.27 cm/s LV PW:         0.90 cm  LV E/e' lateral: 9.2 LV IVS:        0.70 cm  LV e' medial:    6.64 cm/s LVOT diam:     1.80 cm  LV E/e' medial:  11.5 LV SV:         30 LV SV Index:   15 LVOT Area:     2.54 cm  RIGHT VENTRICLE RV S prime:     14.80 cm/s TAPSE (M-mode): 2.4 cm LEFT ATRIUM           Index       RIGHT ATRIUM           Index LA diam:      3.00 cm 1.56 cm/m  RA Area:     13.80 cm LA Vol (A2C): 44.7 ml 23.21 ml/m RA Volume:   33.50 ml  17.39 ml/m LA Vol (A4C): 42.6 ml 22.12 ml/m  AORTIC VALVE LVOT Vmax:   77.70 cm/s LVOT Vmean:  49.700 cm/s LVOT VTI:    0.116 m  AORTA Ao Root diam: 3.00 cm MITRAL VALVE MV Area (PHT): 4.96 cm    SHUNTS MV Decel Time: 153 msec    Systemic VTI:  0.12 m MV E velocity: 76.30 cm/s  Systemic Diam: 1.80 cm MV A velocity: 78.80 cm/s MV E/A ratio:  0.97 Buford Dresser MD Electronically signed by Buford Dresser MD Signature Date/Time: 07/07/2020/5:02:22 PM    Final    [place] ASCITES (ABDOMEN LIMITED)  Result Date: 07/01/2020 CLINICAL DATA:  History of lymphoma, now with concern for symptomatic intra-abdominal ascites. Please  perform ascites  search ultrasound and ultrasound-guided paracentesis as indicated. EXAM: LIMITED ABDOMEN ULTRASOUND FOR ASCITES TECHNIQUE: Limited ultrasound survey for ascites was performed in all four abdominal quadrants. COMPARISON:  CT abdomen pelvis-06/23/2020 FINDINGS: Sonographic evaluation demonstrates a trace amount of intra-abdominal ascites, too small to allow for safe ultrasound-guided paracentesis. Note is made of small bilateral pleural effusions, the right side of which appears new compared to abdominal CT performed 06/23/2020. IMPRESSION: 1. Trace amount of intra-abdominal ascites, too small to allow for safe ultrasound-guided paracentesis. No paracentesis attempted. 2. Small bilateral pleural effusions - the left-sided pleural effusion appears similar to abdominal CT performed 06/23/2020 however the right-sided pleural effusion appears new of the CT. Electronically Signed   By: Simonne Come M.D.   On: 07/01/2020 15:11   US Abdomen Limited RUQ  Result Date: 07/02/2020 CLINICAL DATA:  Abnormal LFTs EXAM: ULTRASOUND ABDOMEN LIMITED RIGHT UPPER QUADRANT COMPARISON:  CT 06/23/2020.  Ultrasound 07/01/2020 FINDINGS: Gallbladder: Prior cholecystectomy Common bile duct: Diameter: Prominent measuring up to 10 mm, likely related to post cholecystectomy state. Liver: Heterogeneous, slightly increased echotexture. Subtle nodular contours to the liver surface. Findings raise the possibility of cirrhosis. No suspicious focal hepatic abnormality. Portal vein is patent on color Doppler imaging with normal direction of blood flow towards the liver. Other: Ascites and right effusion noted. Prominent porta hepatis lymph nodes. IMPRESSION: Heterogeneous, increased echotexture throughout the liver. Subtle nodular contour seen in some areas of the liver. Findings suggest the possibility of cirrhosis. Perihepatic ascites and right pleural effusion. Mild biliary ductal dilatation, likely related to post cholecystectomy  state. Prominent porta hepatis lymph nodes, likely related to liver disease. Electronically Signed   By: Charlett Nose M.D.   On: 07/02/2020 08:52   US THORACENTESIS ASP PLEURAL SPACE W/IMG GUIDE  Result Date: 07/08/2020 INDICATION: Lymphoma, remote colon and thyroid cancers, dyspnea, right pleural effusion.request for diagnostic and therapeutic thoracentesis. EXAM: ULTRASOUND GUIDED RIGHT THORACENTESIS MEDICATIONS: 1% lidocaine 10 mL COMPLICATIONS: None immediate. PROCEDURE: An ultrasound guided thoracentesis was thoroughly discussed with the patient and questions answered. The benefits, risks, alternatives and complications were also discussed. The patient understands and wishes to proceed with the procedure. Written consent was obtained. Ultrasound was performed to localize and mark an adequate pocket of fluid in the right chest. The area was then prepped and draped in the normal sterile fashion. 1% Lidocaine was used for local anesthesia. Under ultrasound guidance a 6 Fr Safe-T-Centesis catheter was introduced. Thoracentesis was performed. The catheter was removed and a dressing applied. FINDINGS: A total of approximately 500 mL of red fluid was removed. Samples were sent to the laboratory as requested by the clinical team. IMPRESSION: Successful ultrasound guided right thoracentesis yielding 500 mL of pleural fluid. Read by: Corrin Parker, PA-C Electronically Signed   By: Judie Petit.  Shick M.D.   On: 07/08/2020 16:31   US THORACENTESIS ASP PLEURAL SPACE W/IMG GUIDE  Result Date: 07/04/2020 INDICATION: Patient with history of lymphoma, remote colon and thyroid cancers, dyspnea, right pleural effusion. Request made for diagnostic and therapeutic right thoracentesis. EXAM: ULTRASOUND GUIDED DIAGNOSTIC AND THERAPEUTIC RIGHT THORACENTESIS MEDICATIONS: 1% lidocaine to skin and subcutaneous tissue COMPLICATIONS: None immediate. PROCEDURE: An ultrasound guided thoracentesis was thoroughly discussed with the patient and  questions answered. The benefits, risks, alternatives and complications were also discussed. The patient understands and wishes to proceed with the procedure. Written consent was obtained. Ultrasound was performed to localize and mark an adequate pocket of fluid in the right chest. The area was then prepped and draped in  the normal sterile fashion. 1% Lidocaine was used for local anesthesia. Under ultrasound guidance a 6 Fr Safe-T-Centesis catheter was introduced. Thoracentesis was performed. The catheter was removed and a dressing applied. FINDINGS: A total of approximately 1.6 liters of blood-tinged fluid was removed. Samples were sent to the laboratory as requested by the clinical team. IMPRESSION: Successful ultrasound guided diagnostic and therapeutic right thoracentesis yielding 1.6 liters of pleural fluid. Read by: Rowe Robert, PA-C Electronically Signed   By: Lucrezia Europe M.D.   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 Status post weekly Rituxan x4 03/01/2012 through 03/22/2012. She completed 4 "maintenance" doses of Rituxan, last on 12/19/2012. A restaging CT on 02/09/2013 showed no evidence of lymphoma.   Lymph node lateral to the thyroid bed on a neck ultrasound 02/21/2014, status post an FNA biopsy concerning for a lymphoproliferative disorder.  PET scan 09/28/2016 with active lymphoma within the neck, chest, abdomen, pelvis; splenic enlargement and hypermetabolism suspicious for splenic involvement.  Initiation of Rituxan weekly 4 09/29/2016  Initiation of maintenance Rituxan on a 3 month schedule 12/23/2016; final Rituxan 08/31/2018  Thyroid ultrasound 02/07/2019-left cervical lymphadenopathy  PET scan 03/08/2019-extensive recurrent hypermetabolic lymphoma involving the neck, chest, abdomen and pelvis.  03/16/2019 left cervical lymph  node biopsy-features consistent with previously diagnosed non-Hodgkin's B-cell lymphoma, phenotypically consistent with marginal zone lymphoma. Flow cytometry with lambda restricted B-cell population without expression of CD5 or CD10 comprising 87% of all lymphocytes.  Cycle 1 bendamustine/Rituxan 03/22/2019  Excision deep left axillary lymph nodes 05/04/2019-non-Hodgkin's B-cell lymphoma with differential including a marginal zone lymphoma and atypical small lymphocytic lymphoma. Flow cytometry with monoclonal B-cell population without expression of CD5 or CD10, comprises 96% of all lymphocytes.  Cycle 1 [facility]/Rituxan 05/18/2019  Cycle 2 [facility]/Rituxan 06/08/2019  CTs 06/15/2019-partial improvement in diffuse adenopathy, stable mild splenomegaly  Cycle 1 Revlimid/rituximab 07/19/2019 (Revlimid start 07/20/2019)  Cycle 2 Revlimid/rituximab 08/16/2019 (Revlimid placed on hold 08/30/2019 due to neutropenia)  Cycle 3 of Revlimid/rituximab 09/13/2019 (Revlimid schedule changed to 14 days on/14 days off)  Cycle 4 Revlimid/rituximab 10/10/2019  Cycle 5 Revlimid/rituximab 11/07/2019  Cycle 6 Revlimid/rituximab 12/07/2019  CTs 12/28/2019-diffuse lymphadenopathy-slightly increased compared to 06/15/2019  Cycle 7 Revlimid/rituximab 01/04/2020  Cycle 8 Revlimid/Rituxan 02/01/2020  Cycle 9 Revlimid/Rituxan 03/05/2020  Cycle 10 Revlimid/Rituxan 04/02/2020  Cycle 11 Revlimid/Rituxan 04/30/2020  CT 05/22/2020-mild increase in left supraclavicular adenopathy, mild increase in chest, retroperitoneal, and pelvic adenopathy. Stable mild splenomegaly.  Acalabrutinib started 06/19/2020  Acalabrutinib discontinued on hospital admission 06/23/2020 secondary to nausea and diarrhea  acalabrutinib resumed 06/26/2020  CT abdomen/pelvis 06/23/2020-moderate left and trace right pleural effusions, new diffuse mesenteric edema and ascites, stable retroperitoneal and iliac adenopathy, mild splenomegaly  Acalabrutinib  discontinued 07/02/2020 secondary to altered mental status  Acalabrutinib resumed 07/17/2020  2. Stage IV (T1bN1b M0) papillary thyroid cancer, status post a thyroidectomy with reimplantation of the left superior parathyroid gland on 05/23/2012, status post radioactive iodine therapy, followed by Dr. Buddy Duty.  3. Stage II (T3 N0) colon cancer, status post a right colectomy 10/19/2011, last colonoscopy April 2015-sigmoid adenoma removed.  4. History of a pulmonary embolism December 2012.  5. History of Atrial fibrillation 6. Iron deficiency anemia-new 03/18/2014. Hemoccult positive stool. The anemia corrected with iron. No longer taking iron.  Status post an upper endoscopy and colonoscopy by Dr. Carlean Purl April 2015 with no bleeding source identified, benign adenoma removed from the sigmoid colon.  7. Report of an upper gastric intestinal bleed fall 2016-managed in [place]. [name] 8. Left knee replacement May 2017, repeat left knee surgery May 2018 9. Pruritic rash 07/22/2016 10. Nausea and diarrhea 04/02/2019-stool negative for C. difficile toxin 11. 06/18/2019 [facility] admission for symptomatic anemia. 12.  Admission 06/23/2020 with nausea and diarrhea 13.  Respiratory failure, bilateral pleural effusions  Right thoracentesis 07/04/2020-culture and cytology negative  CT chest 07/07/2020-right and left pleural effusions, bilateral basilar atelectasis, interlobular septal thickening in the right lung-asymmetric edema?  Improved adenopathy  Right thoracentesis 07/08/2020-flow cytometry with a small clonal B-cell population  Right thoracentesis 07/17/2020-flow cytometry and cytology pending  Tracy Mclaughlin appears improved.  She is now off oxygen following thoracentesis.  Will follow up on flow and cytology.  She was restarted on acalabrutinib 07/17/2020.  Tolerating it well.  AST mildly elevated, but stable.  T bili up slightly today.  She is not ready for discharge to home or a skilled nursing facility at  present.  Recommend for nursing to mobilize patient.  The patient will likely need SNF prior to discharge.  [place]   Recommendations: 1.  Continue acalabrutinib 100 mg daily.   2.  Monitor O2 sats closely on room air. 3.  Follow liver enzymes while on acalabrutinib 4.  The patient may discharge to SNF over the weekend if respiratory status is stable.  We will plan for outpatient follow-up at the center if she is discharged over the weekend.  Please call oncology over the weekend for questions.   LOS: 24 days   Tracy Mclaughlin 07/18/[name] was interviewed and examined.  She feels better after the thoracentesis yesterday.  Her oxygen requirement has diminished.  We are waiting on results of flow cytometry, cytology, and a culture from the pleural fluid.  She resumed at acalabrutinib yesterday.  I think it is unlikely the acalabrutinib contributed to her respiratory failure.  We will monitor the liver enzymes closely while on acalabrutinib.  I appreciate the consult from the pulmonary service.  We will begin steroid therapy if she has recurrent hypoxia.  She needs to continue physical therapy.  I think she will benefit from a skilled nursing facility stay as it would be difficult for her husband to care for her in her current condition.

## 2020-07-18 NOTE — Progress Notes (Signed)
PROGRESS NOTE    Tracy Mclaughlin  YTK:[id number] DOB: May 05, 1934 DOA: 06/23/2020 PCP: Tracy Collum, PA    Brief Narrative:  This patient is 86 y.o.femalewith medical history significant forlow-grade lymphoma without significant response to chemotherapy last year and was recently started onBTK INHIBITORby Dr. [name], hypothyroidism, history of A. fib, pancytopenia presents to the ED with3-4 day history of nausea vomiting and diarrhea and abdominal pain-and right upper quadrant pain.symptoms started after new chemo on Thursday.Patient denies any fever,  chest pain.vomitted x 3 today and had non bloody diarrhea 4 times today.  ED Course:Hemodynamically stable, afebrile, UA positive for UTI, CT scan abdomen done that showed free fluid with ascites and mesenteric edema, case was discussed Dr. [name] from oncology,  advised UTI treatment with Zosyn .  Patient  was admitted, being managed symptomatically, chemo was held,  diarrhea improved subsequently,  oncology put the patient back on chemo overall tolerating well. Having nausea anorexia suspecting this is related to her lymphoma. Patient has been started on prednisone to help with appetite and also received trazodone for sleep. Patient remains deconditioned.  She was found to get more sleepy and confused subsequently trazodone and prednisone were discontinued with resolution of her confusion. Patient was also noticed to have shortness of breath/dyspnea with activity-imaging studies showed pleural effusion underwent right-sided thoracentesis with 1.6 L of blood-tinged fluid removal, cytology reactive medically stable, no growth on culture. PT had suggested skilled nursing facility,  patient had PICC line, plan:  SNF as well as home health.  Patient went into rapid tachycardia supraventricular 7/19 a.m. and also with hypoxia-underwent CTA chest no PE but right-sided pleural effusion atelectasis/consolidation-cardiologist consulted.   Patient converted to sinus rhythm spontaneously. received few days of IV antibiotics , Procalcitonin has been negative/normal- no fever no leukocytosis. Patient continues to remain hypoxic and in respiratory failure, chest x-ray shows reaccumulation of the right pleural effusion.  Pulmonology consulted for repeat thoracocentesis or potentially Pleurx catheter. She underwent thoracocentesis 1400 cc of fluid removed sent for cytology flow cytometry. Oncology resumed acalabrutinib plan if she does not improve would recommend hospice.   Assessment & Plan:   Principal Problem:   Recurrent pleural effusion on right Active Problems:   Non Hodgkin's lymphoma (HCC)   Hypertension   Anemia, iron deficiency   PAF (paroxysmal atrial fibrillation) (HCC)   Anemia   Pancytopenia (HCC)   Nausea & vomiting   UTI (urinary tract infection)   Ascites   Acute respiratory failure with hypoxia and hypercapnia (HCC)  Acute hypoxic respiratory failure due to pleural effusion / No PE on the CT. -Stable, still requiring 4 L of oxygen, to maintain O2 sat greater than 92% -Currently satting 97% -Pleural cultures are growing gram-positive cocci - on  broad-spectrum antibiotics  Vanco and Zosyn  -we will modify and narrow down antibiotics according to final cultures and sensitivity. -Pleural fluid grew staph epi, we will continue with vancomycin and DC Zosyn.  -Remains in respiratory distress,  O2 dependent, hypoxic,  worsening shortness of breath with minimal exertion - Recurrent pleural effusion -07/08/2020 US guidedrightthoracentesis. Yielded500 mLof redfluid. CXR: 1. Increase in volume of right pleural effusion with mild interstitial edema 2. Bilateral airspace opacities concerning for multifocal pneumonia.  -Continue gentle diuretics -As needed duo nebs  -Low suspicion for pneumonia, sepsis. - chest x-ray shows reaccumulation of the right pleural effusion.   - Pulmonology consulted for repeat  thoracocentesis or potentially Pleurx catheter. -She underwent thoracocentesis, 1400 cc fluid drained, sent for flow  cytometry, cytology and other studies.  Intractable nausea vomitingdiarrhea andabdominal pain: -Resolved -Unclear etiology suspecting due to [place] held on admission and resumed 7/8. Diarrhea improved.   Acalabrutinib  was again discontinued as her LFTs were worsening. Has new onset ascites on the CT scan-but too small to tap on the ultrasound . Cont supportive measure.  - Prednisone was added to stimulate appetite but discontinued due to confusion  Recurrent pleural effusion: -Likely from  Lymphoma Vs infection or 2/2 Acalabrutinib ( less likely per oncology) . S/p rt thoracentesis 1.6 L blood-tinged fluid was removed.  Pleural fluid Gram stain no organism, culture no growth so far, cytology reactive mesothelial cell.   CTA shows moderate right and mild to moderate left pleural effusions-BNP around 400  -Echo shows preserved EJF 60 to 65%.  no diastolic dysfunction, unclear if related to CHF versus low Albumin.   Cardiology on board will start trial of IV Lasix low-dose, I's and O's: +10.1 L  - ordered US guided thoracentesis along with labs/cytology, discussed with cardio and  Oncology.  Narrow complex tachycardia - Resolved.  Converted to sinus rhythm.  No recurrent episode -remained stable- Cardiology following closely -Continue diltiazem CD 180 nightly, Lopressor 12.5 mg p.o. twice daily, -Following thyroid function -2D echocardiogram reviewed.  Ascites: -new onset ascites on the CT scan-too small to tap on paracentesis per IR.  Abnormal LFTS: -Improving 2/2 Acalabrutinib-which is discontinued.  LFTs improving. RUQ [place]- possible cirrhotic changes, hepatitis panel negative.   Acute metabolic encephalopathy from polypharmacy: -Mentation back to baseline  had  confusion/Lethargy: 7/14 a.m likley polypharmacy and pt feels improved after stopping  trazodone/prednisone.   -Continue to complain of insomnia, sleeping medication as needed  She is on Xanax low-dose continue for anxiety.  Continue supportive care.   E coli UTI POA: Completed 7 days of antibiotics.   Non Hodgkin's lymphoma -Remained stable, -Oncology following -with progressive decline, patientwasstartedpast weekonAcalabrutinib she did not have significant response to chemotherapy last year.Acalabrutinibwas held on admission and resumed  But again stopped due to worsening LFTs by oncology.  Oncology resumed acalabrutinib plan if she does not improve would recommend hospice. Monitor liver enzymes while being on acalabrutinib.   Failure to thrive/deconditioning: Remains weak and deconditioned.  -PT continues to recommend SNF -She refused SNF.  She is high risk for readmission.  Husband concerned about poor support.  Hypertension: BP is controlled.  Continue Cardizem.    Pancytopenia with anemia/Thrombocytopenia in the setting of lymphoma/chemo: -Hemoglobin and WBC count overall stable.  Monitor. Heparin was discontinued by hematology due to bilateral lower leg bruise and thrombocytopenia.    History of intermittent atrial fibrillation on Cardizem: - Not on anticoagulation due to history of subarachnoid hemorrhage as per cardiology. Bruise on the skin heparin discontinued per oncology. improving.   DVT prophylaxis: SCDS Code Status: DNR Family Communication: Discussed with patient today.    Status is: Inpatient Remains inpatient appropriate because: Patient remains deconditioned weak, hypoxic.  Dispo: The patient is from: Home   Anticipated d/c is to: SNF as per PT- pt declines SNF and HH.  Continued to engage on placement recomendations.  Anticipated d/c date is: 2 days.  Once shortness is better, and cleared by her oncologist.discussed with her oncology   Patient currently not medically stable.   Consultants:    Hematology, Pulmonology  Procedures: Right thoracocentesis 7/29.  Antimicrobials: Anti-infectives (From admission, onward)   Start     Dose/Rate Route Frequency Ordered Stop   07/17/20 1800  vancomycin (VANCOREADY) IVPB 500 mg/100 mL     Discontinue     500 mg 100 mL/hr over 60 Minutes Intravenous Every 12 hours 07/17/20 0708     07/10/20 1600  vancomycin (VANCOREADY) IVPB 750 mg/150 mL  Status:  Discontinued        750 mg 150 mL/hr over 60 Minutes Intravenous Every 12 hours 07/10/20 1332 07/17/20 0708   07/10/20 1430  piperacillin-tazobactam (ZOSYN) IVPB 3.375 g  Status:  Discontinued        3.375 g 12.5 mL/hr over 240 Minutes Intravenous Every 8 hours 07/10/20 1332 07/15/20 1333   07/06/20 1800  cefTRIAXone (ROCEPHIN) 1 g in sodium chloride 0.9 % 100 mL IVPB  Status:  Discontinued        1 g 200 mL/hr over 30 Minutes Intravenous Every 24 hours 07/06/20 1646 07/08/20 1037   07/06/20 1800  azithromycin (ZITHROMAX) tablet 500 mg  Status:  Discontinued        500 mg Oral Daily 07/06/20 1646 07/08/20 1037   06/27/20 0830  cefTRIAXone (ROCEPHIN) 1 g in sodium chloride 0.9 % 100 mL IVPB  Status:  Discontinued        1 g 200 mL/hr over 30 Minutes Intravenous Every 24 hours 06/27/20 0733 06/30/20 1340   06/24/20 0200  piperacillin-tazobactam (ZOSYN) IVPB 3.375 g  Status:  Discontinued        3.375 g 12.5 mL/hr over 240 Minutes Intravenous Every 8 hours 06/23/20 1703 06/27/20 0733   06/23/20 1645  piperacillin-tazobactam (ZOSYN) IVPB 3.375 g        3.375 g 100 mL/hr over 30 Minutes Intravenous  Once 06/23/20 1638 06/23/20 1834   06/23/20 1615  cefTRIAXone (ROCEPHIN) 1 g in sodium chloride 0.9 % 100 mL IVPB  Status:  Discontinued        1 g 200 mL/hr over 30 Minutes Intravenous  Once 06/23/20 1607 06/23/20 1608     Subjective: Patient was seen and examined at bedside.  No overnight events.  She appears comfortable but continues to have shortness of breath on exertion.  she underwent  thoracocentesis, 1400 cc of fluid drained.  She tolerated procedure well.   Objective: Vitals:   07/18/20 0859 07/18/20 0900 07/18/20 0902 07/18/20 1403  BP: (!) 108/48 (!) 86/41 (!) 140/63 (!) 120/44  Pulse: 92 (!) 115 89 82  Resp:    18  Temp:    97.9 F (36.6 C)  TempSrc:    Oral  SpO2:    94%  Weight:      Height:       No intake or output data in the 24 hours ending 07/18/20 1432 Filed Weights   07/12/20 0500 07/15/20 0500 07/16/20 0614  Weight: 71.1 kg 65.5 kg 68.8 kg    Examination:  General exam: Appears calm and comfortable  Respiratory system: Clear to auscultation. Respiratory effort normal.  Porta catheter on the chest. Cardiovascular system: S1 & S2 heard, RRR. No JVD, murmurs, rubs, gallops or clicks. No pedal edema. Gastrointestinal system: Abdomen is nondistended, soft and nontender. No organomegaly or masses felt. Normal bowel sounds heard. Central nervous system: Alert and oriented. No focal neurological deficits. Extremities:  No pain, Bruising noted on LE Skin: No rashes, lesions or ulcers Psychiatry: Judgement and insight appear normal. Mood & affect appropriate.     Data Reviewed: I have personally reviewed following labs and imaging studies  CBC: Recent Labs  Lab 07/14/20 0416 07/15/20 0408 07/16/20 0543 07/17/20 0433 07/18/20 0325  WBC  4.8 5.7 5.2 4.9 6.2  HGB 10.4* 10.4* 9.9* 10.0* 10.4*  HCT 32.6* 32.2* 30.1* 31.1* 32.3*  MCV 106.5* 104.9* 102.7* 105.8* 104.9*  PLT 128* 152 152 154 742*   Basic Metabolic Panel: Recent Labs  Lab 07/13/20 0442 07/13/20 0442 07/14/20 0416 07/15/20 0408 07/16/20 0543 07/17/20 0433 07/18/20 0325  NA 140   < > 136 138 138 140 136  K 2.9*   < > 2.8* 3.1* 2.5* 4.0 4.5  CL 87*   < > 84* 87* 88* 91* 94*  CO2 40*   < > 40* 40* 37* 37* 33*  GLUCOSE 106*   < > 107* 102* 104* 98 114*  BUN 16   < > 14 13 16 14 12   CREATININE 0.69   < > 0.68 0.75 0.80 0.66 0.76  CALCIUM 7.0*   < > 7.0* 7.2* 7.2* 7.7* 7.8*    MG 1.4*  --   --   --  1.5* 2.4  --   PHOS 3.8  --   --   --   --  2.5  --    < > = values in this interval not displayed.   GFR: Estimated Creatinine Clearance: 53.7 mL/min (by C-G formula based on SCr of 0.76 mg/dL). Liver Function Tests: Recent Labs  Lab 07/17/20 0433 07/18/20 0325  AST 55* 53*  ALT 31 29  ALKPHOS 143* 147*  BILITOT 1.1 1.3*  PROT 4.9* 5.2*  ALBUMIN 2.5* 2.6*   No results for input(s): LIPASE, AMYLASE in the last 168 hours. No results for input(s): AMMONIA in the last 168 hours. Coagulation Profile: No results for input(s): INR, PROTIME in the last 168 hours. Cardiac Enzymes: No results for input(s): CKTOTAL, CKMB, CKMBINDEX, TROPONINI in the last 168 hours. BNP (last 3 results) No results for input(s): PROBNP in the last 8760 hours. HbA1C: No results for input(s): HGBA1C in the last 72 hours. CBG: No results for input(s): GLUCAP in the last 168 hours. Lipid Profile: No results for input(s): CHOL, HDL, LDLCALC, TRIG, CHOLHDL, LDLDIRECT in the last 72 hours. Thyroid Function Tests: No results for input(s): TSH, T4TOTAL, FREET4, T3FREE, THYROIDAB in the last 72 hours. Anemia Panel: No results for input(s): VITAMINB12, FOLATE, FERRITIN, TIBC, IRON, RETICCTPCT in the last 72 hours. Sepsis Labs: No results for input(s): PROCALCITON, LATICACIDVEN in the last 168 hours.  Recent Results (from the past 240 hour(s))  Gram stain     Status: None   Collection Time: 07/08/20  5:03 PM   Specimen: Fluid  Result Value Ref Range Status   Specimen Description FLUID PLEURAL  Final   Special Requests   Final    BOTTLES DRAWN AEROBIC AND ANAEROBIC Blood Culture adequate volume   Gram Stain   Final    RARE WBC PRESENT, PREDOMINANTLY MONONUCLEAR NO ORGANISMS SEEN Performed at [facility], [address] [place], [place] [address]    Report Status 07/08/2020 FINAL  Final  Culture, body fluid-bottle     Status: Abnormal   Collection Time: 07/08/20  5:04 PM    Specimen: Fluid  Result Value Ref Range Status   Specimen Description FLUID PLEURAL  Final   Special Requests   Final    BOTTLES DRAWN AEROBIC AND ANAEROBIC Blood Culture adequate volume   Gram Stain   Final    GRAM POSITIVE COCCI IN CLUSTERS AEROBIC BOTTLE ONLY CRITICAL RESULT CALLED TO, READ BACK BY AND VERIFIED WITH: RN RUTH M. [id number] FCP Performed at [facility], [place] [address] [place], [place] [address]    Culture STAPHYLOCOCCUS EPIDERMIDIS (A)  Final   Report Status 07/12/2020 FINAL  Final   Organism ID, Bacteria STAPHYLOCOCCUS EPIDERMIDIS  Final      Susceptibility   Staphylococcus epidermidis - MIC*    CIPROFLOXACIN 2 INTERMEDIATE Intermediate     ERYTHROMYCIN >=8 RESISTANT Resistant     GENTAMICIN 2 SENSITIVE Sensitive     OXACILLIN >=4 RESISTANT Resistant     TETRACYCLINE >=16 RESISTANT Resistant     VANCOMYCIN 2 SENSITIVE Sensitive     TRIMETH/SULFA 20 SENSITIVE Sensitive     CLINDAMYCIN <=0.25 SENSITIVE Sensitive     RIFAMPIN <=0.5 SENSITIVE Sensitive     Inducible Clindamycin NEGATIVE Sensitive     * STAPHYLOCOCCUS EPIDERMIDIS  Body fluid culture (includes gram stain)     Status: None (Preliminary result)   Collection Time: 07/17/20  5:23 PM   Specimen: Pleural Fluid  Result Value Ref Range Status   Specimen Description   Final    PLEURAL Performed at [facility] [address], [place], [place] [address]    Special Requests   Final    NONE Performed at [facility], [place] [address] [place], [place] [address]    Gram Stain   Final    MODERATE WBC PRESENT, PREDOMINANTLY MONONUCLEAR NO ORGANISMS SEEN    Culture   Final    NO GROWTH < 12 HOURS Performed at [facility] [address], [place], [place] [address]    Report Status PENDING  Incomplete     Radiology Studies: DG CHEST PORT 1 VIEW  Result Date: 07/17/2020 CLINICAL DATA:  Status post thoracentesis. EXAM: PORTABLE CHEST 1 VIEW  COMPARISON:  Radiograph yesterday. FINDINGS: Decreased size of right pleural effusion post thoracentesis. Small residual at the right lung base. No visualized pneumothorax. Right chest port remains in place. Heart is normal in size. Left lung is clear without focal abnormality. No significant left pleural effusion. IMPRESSION: Decreased size of right pleural effusion post thoracentesis, small residual at the right lung base. No visualized pneumothorax. Electronically Signed   By: Keith Rake M.D.   On: 07/17/2020 17:36    Scheduled Meds: . acalabrutinib  100 mg Oral QHS  . Chlorhexidine Gluconate Cloth  6 each Topical Daily  . citalopram  20 mg Oral Daily  . diltiazem  180 mg Oral QHS  . feeding supplement ([place] STANDARD 1.4)  325 mL Oral Daily  . feeding supplement (PRO-STAT SUGAR FREE 64)  30 mL Oral Daily  . furosemide  20 mg Intravenous BID  . heparin lock flush  500 Units Intracatheter Q30 days  . levothyroxine  224 mcg Oral Q0600  . melatonin  3 mg Oral QHS  . metoprolol tartrate  12.5 mg Oral BID  . multivitamin with minerals  1 tablet Oral Daily  . pantoprazole  40 mg Oral Daily   Continuous Infusions: . sodium chloride 250 mL (07/18/20 0615)  . vancomycin 500 mg (07/18/20 0626)     LOS: 24 days    Time spent: 25 mins.    Shawna Clamp, MD [facility]   If 7PM-7AM, please contact night-coverage

## 2020-07-18 NOTE — Progress Notes (Signed)
This RN agrees with previous nurse's assessment.

## 2020-07-19 LAB — COMPREHENSIVE METABOLIC PANEL
ALT: 31 U/L (ref 0–44)
AST: 50 U/L — ABNORMAL HIGH (ref 15–41)
Albumin: 2.6 g/dL — ABNORMAL LOW (ref 3.5–5.0)
Alkaline Phosphatase: 141 U/L — ABNORMAL HIGH (ref 38–126)
Anion gap: 9 (ref 5–15)
BUN: 15 mg/dL (ref 8–23)
CO2: 33 mmol/L — ABNORMAL HIGH (ref 22–32)
Calcium: 8.2 mg/dL — ABNORMAL LOW (ref 8.9–10.3)
Chloride: 96 mmol/L — ABNORMAL LOW (ref 98–111)
Creatinine, Ser: 0.76 mg/dL (ref 0.44–1.00)
GFR calc Af Amer: >60 mL/min (ref >60)
GFR calc non Af Amer: >60 mL/min (ref >60)
Glucose, Bld: 112 mg/dL — ABNORMAL HIGH (ref 70–99)
Potassium: 3.8 mmol/L (ref 3.5–5.1)
Sodium: 138 mmol/L (ref 135–145)
Total Bilirubin: 1.5 mg/dL — ABNORMAL HIGH (ref 0.3–1.2)
Total Protein: 5.4 g/dL — ABNORMAL LOW (ref 6.5–8.1)

## 2020-07-19 LAB — CBC
HCT: 32.3 % — ABNORMAL LOW (ref 36.0–46.0)
Hemoglobin: 10.3 g/dL — ABNORMAL LOW (ref 12.0–15.0)
MCH: 33.3 pg (ref 26.0–34.0)
MCHC: 31.9 g/dL (ref 30.0–36.0)
MCV: 104.5 fL — ABNORMAL HIGH (ref 80.0–100.0)
Platelets: 168 10*3/uL (ref 150–400)
RBC: 3.09 MIL/uL — ABNORMAL LOW (ref 3.87–5.11)
RDW: 14.7 % (ref 11.5–15.5)
WBC: 7.1 10*3/uL (ref 4.0–10.5)
nRBC: 0 % (ref 0.0–0.2)

## 2020-07-19 MED ORDER — SULFAMETHOXAZOLE-TRIMETHOPRIM 800-160 MG PO TABS
2.0000 | ORAL_TABLET | Freq: Two times a day (BID) | ORAL | Status: AC
  Administered 2020-07-19 – 2020-07-23 (×9): 2 via ORAL
  Filled 2020-07-19 (×11): qty 2

## 2020-07-19 NOTE — Progress Notes (Signed)
PROGRESS NOTE    Tracy Mclaughlin  KXF:[id number] DOB: 27-Dec-1933 DOA: 06/23/2020 PCP: [name], PA    Brief Narrative:  This patient is 86 y.o.femalewith medical history significant forlow-grade lymphoma without significant response to chemotherapy last year and was recently started onBTK INHIBITORby Dr. [name], hypothyroidism, history of A. fib, pancytopenia presents to the ED with3-4 day history of nausea vomiting and diarrhea and abdominal pain-and right upper quadrant pain.symptoms started after new chemo on Thursday.Patient denies any fever,  chest pain.vomitted x 3 today and had non bloody diarrhea 4 times today.  ED Course:Hemodynamically stable, afebrile, UA positive for UTI, CT scan abdomen done that showed free fluid with ascites and mesenteric edema, case was discussed Dr. [name] from oncology,  advised UTI treatment with Zosyn .  Patient  was admitted, being managed symptomatically, chemo was held,  diarrhea improved subsequently,  oncology put the patient back on chemo overall tolerating well. Having nausea anorexia suspecting this is related to her lymphoma. Patient has been started on prednisone to help with appetite and also received trazodone for sleep. Patient remains deconditioned.  She was found to get more sleepy and confused subsequently trazodone and prednisone were discontinued with resolution of her confusion. Patient was also noticed to have shortness of breath/dyspnea with activity-imaging studies showed pleural effusion underwent right-sided thoracentesis with 1.6 L of blood-tinged fluid removal, cytology reactive medically stable, no growth on culture. PT had suggested skilled nursing facility,  patient had PICC line, plan:  SNF as well as home health.  Patient went into rapid tachycardia supraventricular 7/19 a.m. and also with hypoxia-underwent CTA chest no PE but right-sided pleural effusion atelectasis/consolidation-cardiologist consulted.   Patient converted to sinus rhythm spontaneously. received few days of IV antibiotics , Procalcitonin has been negative/normal- no fever no leukocytosis. Patient continues to remain hypoxic and in respiratory failure, chest x-ray shows reaccumulation of the right pleural effusion.  Pulmonology consulted for repeat thoracocentesis or potentially Pleurx catheter. She underwent thoracocentesis 1400 cc of fluid removed sent for cytology flow cytometry. Oncology resumed acalabrutinib plan if she does not improve would recommend hospice. Patient seems not interested in physical therapy.   Assessment & Plan:   Principal Problem:   Recurrent pleural effusion on right Active Problems:   Non Hodgkin's lymphoma (HCC)   Hypertension   Anemia, iron deficiency   PAF (paroxysmal atrial fibrillation) (HCC)   Anemia   Pancytopenia (HCC)   Nausea & vomiting   UTI (urinary tract infection)   Ascites   Acute respiratory failure with hypoxia and hypercapnia (HCC)  Acute hypoxic respiratory failure due to pleural effusion / No PE on the CT. -Stable, still requiring 4 L of oxygen, to maintain O2 sat greater than 92% -Currently satting 97% -Pleural cultures are growing gram-positive cocci - on  broad-spectrum antibiotics  Vanco and Zosyn  -we will modify and narrow down antibiotics according to final cultures and sensitivity. -Pleural fluid grew staph epi, we will continue with vancomycin and DC Zosyn.  -Remains in respiratory distress,  O2 dependent, hypoxic,  worsening shortness of breath with minimal exertion - Recurrent pleural effusion -07/08/2020 US guidedrightthoracentesis. Yielded500 mLof redfluid. CXR: 1. Increase in volume of right pleural effusion with mild interstitial edema 2. Bilateral airspace opacities concerning for multifocal pneumonia.  -Continue gentle diuretics -As needed duo nebs  -Low suspicion for pneumonia, sepsis. - chest x-ray shows reaccumulation of the right pleural  effusion.   - Pulmonology consulted for repeat thoracocentesis or potentially Pleurx catheter. -She underwent thoracocentesis,  1400 cc fluid drained, sent for flow cytometry, cytology and other studies. -Patient seems not interested in physical therapy.  She is out of bed to the chair.  Intractable nausea vomitingdiarrhea andabdominal pain: -Resolved -Unclear etiology suspecting due to [place] held on admission and resumed 7/8. Diarrhea improved.   Acalabrutinib  was again discontinued as her LFTs were worsening. Has new onset ascites on the CT scan-but too small to tap on the ultrasound . Cont supportive measure.  - Prednisone was added to stimulate appetite but discontinued due to confusion  Recurrent pleural effusion: -Likely from  Lymphoma Vs infection or 2/2 Acalabrutinib ( less likely per oncology) . S/p rt thoracentesis 1.6 L blood-tinged fluid was removed.  Pleural fluid Gram stain no organism, culture no growth so far, cytology reactive mesothelial cell.   CTA shows moderate right and mild to moderate left pleural effusions-BNP around 400  -Echo shows preserved EJF 60 to 65%.  no diastolic dysfunction, unclear if related to CHF versus low Albumin.   Cardiology on board will start trial of IV Lasix low-dose, I's and O's: +10.1 L  - ordered US guided thoracentesis along with labs/cytology, discussed with cardio and  Oncology.  Narrow complex tachycardia - Resolved.  Converted to sinus rhythm.  No recurrent episode -remained stable- Cardiology following closely -Continue diltiazem CD 180 nightly, Lopressor 12.5 mg p.o. twice daily, -Following thyroid function -2D echocardiogram reviewed.  Ascites: Stable. -new onset ascites on the CT scan-too small to tap on paracentesis per IR.  Abnormal LFTS: -Improving 2/2 Acalabrutinib-which is discontinued.  LFTs improving. RUQ [place]- possible cirrhotic changes, hepatitis panel negative.   Acute metabolic encephalopathy  from polypharmacy: -Mentation back to baseline  had  confusion/Lethargy: 7/14 a.m likley polypharmacy and pt feels improved after stopping trazodone/prednisone.   -Continue to complain of insomnia, sleeping medication as needed  She is on Xanax low-dose continue for anxiety.  Continue supportive care.   E coli UTI POA: Completed 7 days of antibiotics.   Non Hodgkin's lymphoma -Remained stable, -Oncology following -with progressive decline, patientwasstartedpast weekonAcalabrutinib she did not have significant response to chemotherapy last year.Acalabrutinibwas held on admission and resumed  But again stopped due to worsening LFTs by oncology.  Oncology resumed acalabrutinib plan if she does not improve would recommend hospice. Monitor liver enzymes while being on acalabrutinib.   Failure to thrive/deconditioning: Remains weak and deconditioned.  -PT continues to recommend SNF -She refused SNF.  She is high risk for readmission.  Husband concerned about poor support. -Patient seems not interested in physical therapy.  Encouraged to get out of bed to chair and participate in therapy.  Hypertension: BP is controlled.  Continue Cardizem.    Pancytopenia with anemia/Thrombocytopenia in the setting of lymphoma/chemo: -Hemoglobin and WBC count overall stable.  Monitor. Heparin was discontinued by hematology due to bilateral lower leg bruise and thrombocytopenia.    History of intermittent atrial fibrillation on Cardizem: - Not on anticoagulation due to history of subarachnoid hemorrhage as per cardiology. Bruise on the skin heparin discontinued per oncology. improving.   DVT prophylaxis: SCDS Code Status: DNR Family Communication: Discussed with patient today.    Status is: Inpatient Remains inpatient appropriate because: Patient remains deconditioned weak, hypoxic.  Dispo: The patient is from: Home   Anticipated d/c is to: SNF as per PT- pt declines SNF and HH.   Continued to engage on placement recomendations.  Anticipated d/c date is: 2 days.  Once shortness is better, and cleared by her oncologist.  discussed with her oncology   Patient currently not medically stable.   Consultants:   Hematology, Pulmonology  Procedures: Right thoracocentesis 7/29.  Antimicrobials: Anti-infectives (From admission, onward)   Start     Dose/Rate Route Frequency Ordered Stop   07/17/20 1800  vancomycin (VANCOREADY) IVPB 500 mg/100 mL     Discontinue     500 mg 100 mL/hr over 60 Minutes Intravenous Every 12 hours 07/17/20 0708     07/10/20 1600  vancomycin (VANCOREADY) IVPB 750 mg/150 mL  Status:  Discontinued        750 mg 150 mL/hr over 60 Minutes Intravenous Every 12 hours 07/10/20 1332 07/17/20 0708   07/10/20 1430  piperacillin-tazobactam (ZOSYN) IVPB 3.375 g  Status:  Discontinued        3.375 g 12.5 mL/hr over 240 Minutes Intravenous Every 8 hours 07/10/20 1332 07/15/20 1333   07/06/20 1800  cefTRIAXone (ROCEPHIN) 1 g in sodium chloride 0.9 % 100 mL IVPB  Status:  Discontinued        1 g 200 mL/hr over 30 Minutes Intravenous Every 24 hours 07/06/20 1646 07/08/20 1037   07/06/20 1800  azithromycin (ZITHROMAX) tablet 500 mg  Status:  Discontinued        500 mg Oral Daily 07/06/20 1646 07/08/20 1037   06/27/20 0830  cefTRIAXone (ROCEPHIN) 1 g in sodium chloride 0.9 % 100 mL IVPB  Status:  Discontinued        1 g 200 mL/hr over 30 Minutes Intravenous Every 24 hours 06/27/20 0733 06/30/20 1340   06/24/20 0200  piperacillin-tazobactam (ZOSYN) IVPB 3.375 g  Status:  Discontinued        3.375 g 12.5 mL/hr over 240 Minutes Intravenous Every 8 hours 06/23/20 1703 06/27/20 0733   06/23/20 1645  piperacillin-tazobactam (ZOSYN) IVPB 3.375 g        3.375 g 100 mL/hr over 30 Minutes Intravenous  Once 06/23/20 1638 06/23/20 1834   06/23/20 1615  cefTRIAXone (ROCEPHIN) 1 g in sodium chloride 0.9 % 100 mL IVPB  Status:  Discontinued        1  g 200 mL/hr over 30 Minutes Intravenous  Once 06/23/20 1607 06/23/20 1608     Subjective: Patient was seen and examined at bedside.  No overnight events.  She appears comfortable but continues to have shortness of breath on exertion.  She seems not interested in physical therapy.  Encouraged to participate which will increase her strength and endurance.  She is out of bed to the chair today.  Objective: Vitals:   07/19/20 0500 07/19/20 0548 07/19/20 1040 07/19/20 1258  BP:  (!) 114/49 (!) 113/52 (!) 124/59  Pulse:  69 85 81  Resp:  18  18  Temp:  97.6 F (36.4 C)  98.8 F (37.1 C)  TempSrc:  Oral    SpO2:  94%  95%  Weight: 66.6 kg     Height:        Intake/Output Summary (Last 24 hours) at 07/19/2020 1433 Last data filed at 07/19/2020 0850 Gross per 24 hour  Intake 400.04 ml  Output 550 ml  Net -149.96 ml   Filed Weights   07/15/20 0500 07/16/20 0614 07/19/20 0500  Weight: 65.5 kg 68.8 kg 66.6 kg    Examination:  General exam: Appears calm and comfortable  Respiratory system: Clear to auscultation. Respiratory effort normal.  Porta catheter on the chest. Cardiovascular system: S1 & S2 heard, RRR. No JVD, murmurs, rubs, gallops or clicks. No pedal edema. Gastrointestinal  system: Abdomen is nondistended, soft and nontender. No organomegaly or masses felt. Normal bowel sounds heard. Central nervous system: Alert and oriented. No focal neurological deficits. Extremities:  No pain, Bruising noted on LE Skin: No rashes, lesions or ulcers Psychiatry: Judgement and insight appear normal. Mood & affect appropriate.     Data Reviewed: I have personally reviewed following labs and imaging studies  CBC: Recent Labs  Lab 07/15/20 0408 07/16/20 0543 07/17/20 0433 07/18/20 0325 07/19/20 0354  WBC 5.7 5.2 4.9 6.2 7.1  HGB 10.4* 9.9* 10.0* 10.4* 10.3*  HCT 32.2* 30.1* 31.1* 32.3* 32.3*  MCV 104.9* 102.7* 105.8* 104.9* 104.5*  PLT 152 152 154 148* 010   Basic Metabolic  Panel: Recent Labs  Lab 07/13/20 0442 07/14/20 0416 07/15/20 0408 07/16/20 0543 07/17/20 0433 07/18/20 0325 07/19/20 0354  NA 140   < > 138 138 140 136 138  K 2.9*   < > 3.1* 2.5* 4.0 4.5 3.8  CL 87*   < > 87* 88* 91* 94* 96*  CO2 40*   < > 40* 37* 37* 33* 33*  GLUCOSE 106*   < > 102* 104* 98 114* 112*  BUN 16   < > 13 16 14 12 15   CREATININE 0.69   < > 0.75 0.80 0.66 0.76 0.76  CALCIUM 7.0*   < > 7.2* 7.2* 7.7* 7.8* 8.2*  MG 1.4*  --   --  1.5* 2.4  --   --   PHOS 3.8  --   --   --  2.5  --   --    < > = values in this interval not displayed.   GFR: Estimated Creatinine Clearance: 53.7 mL/min (by C-G formula based on SCr of 0.76 mg/dL). Liver Function Tests: Recent Labs  Lab 07/17/20 0433 07/18/20 0325 07/19/20 0354  AST 55* 53* 50*  ALT 31 29 31   ALKPHOS 143* 147* 141*  BILITOT 1.1 1.3* 1.5*  PROT 4.9* 5.2* 5.4*  ALBUMIN 2.5* 2.6* 2.6*   No results for input(s): LIPASE, AMYLASE in the last 168 hours. No results for input(s): AMMONIA in the last 168 hours. Coagulation Profile: No results for input(s): INR, PROTIME in the last 168 hours. Cardiac Enzymes: No results for input(s): CKTOTAL, CKMB, CKMBINDEX, TROPONINI in the last 168 hours. BNP (last 3 results) No results for input(s): PROBNP in the last 8760 hours. HbA1C: No results for input(s): HGBA1C in the last 72 hours. CBG: No results for input(s): GLUCAP in the last 168 hours. Lipid Profile: No results for input(s): CHOL, HDL, LDLCALC, TRIG, CHOLHDL, LDLDIRECT in the last 72 hours. Thyroid Function Tests: No results for input(s): TSH, T4TOTAL, FREET4, T3FREE, THYROIDAB in the last 72 hours. Anemia Panel: No results for input(s): VITAMINB12, FOLATE, FERRITIN, TIBC, IRON, RETICCTPCT in the last 72 hours. Sepsis Labs: No results for input(s): PROCALCITON, LATICACIDVEN in the last 168 hours.  Recent Results (from the past 240 hour(s))  Body fluid culture (includes gram stain)     Status: None (Preliminary  result)   Collection Time: 07/17/20  5:23 PM   Specimen: Pleural Fluid  Result Value Ref Range Status   Specimen Description   Final    PLEURAL Performed at [facility] [address] [place], [place] [address]    Special Requests   Final    NONE Performed at [facility], [place] [address] [place], [place] [address]    Gram Stain   Final    MODERATE WBC PRESENT,  PREDOMINANTLY MONONUCLEAR NO ORGANISMS SEEN    Culture   Final    NO GROWTH 2 DAYS Performed at [facility], [place] [address], [place], [place] [address]    Report Status PENDING  Incomplete     Radiology Studies: DG CHEST PORT 1 VIEW  Result Date: 07/17/2020 CLINICAL DATA:  Status post thoracentesis. EXAM: PORTABLE CHEST 1 VIEW COMPARISON:  Radiograph yesterday. FINDINGS: Decreased size of right pleural effusion post thoracentesis. Small residual at the right lung base. No visualized pneumothorax. Right chest port remains in place. Heart is normal in size. Left lung is clear without focal abnormality. No significant left pleural effusion. IMPRESSION: Decreased size of right pleural effusion post thoracentesis, small residual at the right lung base. No visualized pneumothorax. Electronically Signed   By: Keith Rake M.D.   On: 07/17/2020 17:36    Scheduled Meds: . acalabrutinib  100 mg Oral QHS  . Chlorhexidine Gluconate Cloth  6 each Topical Daily  . citalopram  20 mg Oral Daily  . diltiazem  180 mg Oral QHS  . feeding supplement (KATE [place]ARMS STANDARD 1.4)  325 mL Oral Daily  . feeding supplement (PRO-STAT SUGAR FREE 64)  30 mL Oral Daily  . furosemide  20 mg Intravenous BID  . heparin lock flush  500 Units Intracatheter Q30 days  . levothyroxine  224 mcg Oral Q0600  . melatonin  3 mg Oral QHS  . metoprolol tartrate  12.5 mg Oral BID  . multivitamin with minerals  1 tablet Oral Daily  . pantoprazole  40 mg Oral Daily   Continuous Infusions: . sodium chloride 250 mL  (07/18/20 0615)  . vancomycin 500 mg (07/19/20 0544)     LOS: 25 days    Time spent: 25 mins.    Shawna Clamp, MD [facility]   If 7PM-7AM, please contact night-coverage

## 2020-07-19 NOTE — Progress Notes (Signed)
Pt is refusing to ambulate or get out of bed this shift despite frequent encouragement from this RN. PO intake also remains poor despite encouragement to eat and drink. MD notified of pt's status. Will continue to encourage her to eat and get out of bed.

## 2020-07-19 NOTE — Progress Notes (Signed)
Occupational Therapy Treatment Patient Details Name: Tracy Mclaughlin MRN: [id number] DOB: [birth year] Today's Date: 07/19/2020    History of present illness 86 yo female admitted with N/V/D, abd pain, UTI, ascites. Hx of NHL, hypothyroidism, A fib, anemia, OA, PE, SAH.   OT comments  Patient stated "Lady, why do I have to do this if I'm dying?" Therapist provided encouragement and education in regards to therapeutic process and therapeutic goals but assured patient she had personal agency and could choose to decline or participate in therapy. Patient reports she wants to go home and therapist educated patient on the physical abilities needed in order to return home. Patient at times emotional and holding back tears - she discusses her prognosis subtly though changes subject quickly. Therapist provided compassionate listening and allowed patient to choose each activity she wanted to participate in. Treatment focused on patient performing functional activities and functional mobility including brushing her teeth, cleaning her dentures, brushing her hair, standing, ambulating in the room, and getting in and out of bed. Patient min assist for bed mobility and min guard with RW for standing and ambulation until the last stand which required mod assist from recliner. Patient needed rest breaks as she fatigues quickly. She also needs increased time to process her emotions and to make decisions on what activity she will perform next. Patient has not been agreeable to rehab at discharge as of yet but would benefit from it due to her weakened state. If patient returns home recommend 24/7 assistance and home health.   Follow Up Recommendations  Supervision/Assistance - 24 hour;Home health OT;SNF    Equipment Recommendations  Other (comment)    Recommendations for Other Services      Precautions / Restrictions Precautions Precautions: Fall Precaution Comments: monitor O2,HR Restrictions Weight  Bearing Restrictions: No       Mobility Bed Mobility Overal bed mobility: Needs Assistance Bed Mobility: Supine to Sit     Supine to sit: Min assist Sit to supine: Min assist;HOB elevated   General bed mobility comments: Min assist to transfer into sitting for trunk lift off. Min assist for lower extremities to return bed.  Transfers Overall transfer level: Needs assistance Equipment used: Rolling walker (2 wheeled) Transfers: Sit to/from [facility] Sit to Stand: Min guard Stand pivot transfers: Min guard;From elevated surface       General transfer comment: Patient min guard - requesting therapist not touch her - to stand from elevated bed with use of RW. Patient able to take steps to recliner. After seated in recliner for grooming task. patient ambulated to door and back with Rw. Min guard to stand from recliner - due to once again patient not wanting therapist to assist. Patient seated in recliner to rest. Patient then requests to go back to bed. On thrid stand patient needed mod assist to stand from recliner due to fatigue and min guard to take steps to head of bed with RW.    Balance   Sitting-balance support: No upper extremity supported;Feet supported Sitting balance-Leahy Scale: Good     Standing balance support: Bilateral upper extremity supported Standing balance-Leahy Scale: Fair                             ADL either performed or assessed with clinical judgement   ADL Overall ADL's : Needs assistance/impaired     Grooming: Set up;Wash/dry hands;Wash/dry face;Oral care;Brushing hair Grooming Details (indicate cue type and reason):  Patient sat at side of bed to brush hair and wash face. Patient transferred to recliner with back supoorted to perform oral care.             Lower Body Dressing: Maximal assistance Lower Body Dressing Details (indicate cue type and reason): Max assist to donn socks.                      Vision Baseline Vision/History: Cataracts Patient Visual Report: No change from baseline     Perception     Praxis      Cognition Arousal/Alertness: Awake/alert Behavior During Therapy: WFL for tasks assessed/performed (Patient at times suppressing tears when discussing her prognosis.) Overall Cognitive Status: Within Functional Limits for tasks assessed                                          Exercises     Shoulder Instructions       General Comments      Pertinent Vitals/ Pain       Pain Assessment: No/denies pain  Home Living                                          Prior Functioning/Environment              Frequency  Min 2X/week        Progress Toward Goals  OT Goals(current goals can now be found in the care plan section)  Progress towards OT goals: Progressing toward goals  Acute Rehab OT Goals Patient Stated Goal: to go home at discharge OT Goal Formulation: With patient Time For Goal Achievement: 07/30/20 Potential to Achieve Goals: Fair  Plan      Co-evaluation                 AM-PAC OT "6 Clicks" Daily Activity     Outcome Measure   Help from another person eating meals?: None Help from another person taking care of personal grooming?: A Little Help from another person toileting, which includes using toliet, bedpan, or urinal?: A Lot Help from another person bathing (including washing, rinsing, drying)?: A Lot Help from another person to put on and taking off regular upper body clothing?: A Little Help from another person to put on and taking off regular lower body clothing?: A Lot 6 Click Score: 16    End of Session Equipment Utilized During Treatment: Rolling walker;Oxygen  OT Visit Diagnosis: Unsteadiness on feet (R26.81);Muscle weakness (generalized) (M62.81)   Activity Tolerance Patient tolerated treatment well   Patient Left in bed;with call bell/phone within reach;with bed  alarm set   Nurse Communication Mobility status (discussed patient's mobility, participation, emotional status)        Time: 9604-5409 OT Time Calculation (min): 39 min  Charges: OT General Charges $OT Visit: 1 Visit OT Treatments $Self Care/Home Management : 23-37 mins $Therapeutic Activity: 8-22 mins  Jalaya Sarver, OTR/L [place]  Office [phone] Pager: [place] 07/19/2020, 3:11 PM

## 2020-07-20 NOTE — NC FL2 (Signed)
[place] LEVEL OF CARE SCREENING TOOL     IDENTIFICATION  Patient Name: Tracy Mclaughlin Birthdate: September 25, 1934 Sex: female Admission Date (Current Location): 06/23/2020  [facility] and [place] Number:  Herbalist and Address:  [facility],  [place], [place]      Provider Number: [id number]  Attending Physician Name and Address:  Shawna Clamp, MD  Relative Name and Phone Number:  [place], [place]ender [phone]    Current Level of Care: Hospital Recommended Level of Care: [place] Prior Approval Number:    Date Approved/Denied:   PASRR Number:    Discharge Plan: SNF    Current Diagnoses: Patient Active Problem List   Diagnosis Date Noted  . Recurrent pleural effusion on right 07/10/2020  . Acute respiratory failure with hypoxia and hypercapnia ([place]) 07/10/2020  . Nausea & vomiting 06/23/2020  . UTI (urinary tract infection) 06/23/2020  . Ascites 06/23/2020  . Anemia 06/18/2019  . Pancytopenia ([place]) 06/18/2019  . Port-A-Cath in place 05/18/2019  . Goals of care, counseling/discussion 03/21/2019  . Hypersensitivity reaction 09/29/2016  . PAF (paroxysmal atrial fibrillation) ([place]) 07/07/2016  . Personal history of colonic polyps - cancer and adenoma 04/17/2014  . Anemia, iron deficiency 04/02/2014  . Heme + stool 04/02/2014  . Personal history of colon cancer 04/02/2014  . Primary thyroid cancer ([place]) 05/01/2012  . Colon cancer ([place]) 11/24/2011  . Pulmonary embolism ([place]) 11/24/2011  . Non Hodgkin's lymphoma ([place])   . Subarachnoid hemorrhage ([place])   . Arthritis   . Hypertension     Orientation RESPIRATION BLADDER Height & Weight     Self, Time, Situation, Place  Normal Incontinent Weight: 138 lb 14.4 oz (63 kg) Height:  5\' 8"  (172.7 cm)  BEHAVIORAL SYMPTOMS/MOOD NEUROLOGICAL BOWEL NUTRITION STATUS      Continent Diet (Regular diet)  AMBULATORY STATUS COMMUNICATION OF NEEDS Skin    Limited Assist Verbally                         Personal Care Assistance Level of Assistance  Bathing, Dressing, Feeding Bathing Assistance: Limited assistance Feeding assistance: Independent Dressing Assistance: Limited assistance     Functional Limitations Info  Sight, Hearing, Speech Sight Info: Adequate Hearing Info: Adequate Speech Info: Adequate    SPECIAL CARE FACTORS FREQUENCY  PT (By licensed PT), OT (By licensed OT)     PT Frequency: Minimum 5x a week OT Frequency: Minimum 5x a week            Contractures Contractures Info: Not present    Additional Factors Info  Code Status, Allergies, Psychotropic Code Status Info: DNR Allergies Info: Demerol Psychotropic Info: citalopram (CELEXA) tablet 20 mg         Current Medications (07/20/2020):  This is the current hospital active medication list Current Facility-Administered Medications  Medication Dose Route Frequency Provider Last Rate Last Admin  . 0.9 %  sodium chloride infusion   Intravenous PRN Antonieta Pert, MD 10 mL/hr at 07/18/20 0615 250 mL at 07/18/20 0615  . acalabrutinib (CALQUENCE) capsule 100 mg  100 mg Oral QHS Mikey Bussing R, NP   100 mg at 07/19/20 2105  . acetaminophen (TYLENOL) tablet 650 mg  650 mg Oral Q6H PRN Antonieta Pert, MD   650 mg at 07/10/20 2047   Or  . acetaminophen (TYLENOL) suppository 650 mg  650 mg Rectal Q6H PRN Antonieta Pert, MD      . albuterol (  PROVENTIL) (2.5 MG/3ML) 0.083% nebulizer solution 2.5 mg  2.5 mg Nebulization Q2H PRN Rise Patience, MD   2.5 mg at 07/09/20 6644  . ALPRAZolam Duanne Moron) tablet 0.125 mg  0.125 mg Oral BID PRN [name], MD   0.125 mg at 07/19/20 1128  . alum & mag hydroxide-simeth (MAALOX/MYLANTA) 200-200-20 MG/5ML suspension 30 mL  30 mL Oral Q4H PRN Kc, Ramesh, MD   30 mL at 06/29/20 0906  . Chlorhexidine Gluconate Cloth 2 % PADS 6 each  6 each Topical Daily Antonieta Pert, MD   6 each at 07/20/20 1050  . citalopram (CELEXA) tablet 20 mg  20  mg Oral Daily Kc, Ramesh, MD   20 mg at 07/20/20 1049  . diltiazem (CARDIZEM CD) 24 hr capsule 180 mg  180 mg Oral QHS Kc, Ramesh, MD   180 mg at 07/18/20 2229  . feeding supplement (KATE FARMS STANDARD 1.4) liquid 325 mL  325 mL Oral Daily Shawna Clamp, MD      . feeding supplement (PRO-STAT SUGAR FREE 64) liquid 30 mL  30 mL Oral Daily Kc, Ramesh, MD   30 mL at 07/16/20 1423  . furosemide (LASIX) injection 20 mg  20 mg Intravenous BID Cherlynn Kaiser A, MD   20 mg at 07/20/20 1049  . heparin lock flush 100 unit/mL  500 Units Intracatheter Q30 days Antonieta Pert, MD   500 Units at 07/06/20 1546   And  . heparin lock flush 100 unit/mL  500 Units Intracatheter PRN Kc, Maren Beach, MD      . levothyroxine (SYNTHROID) tablet 224 mcg  224 mcg Oral [id number] Antonieta Pert, MD   224 mcg at 07/20/20 0549  . loperamide (IMODIUM) capsule 2 mg  2 mg Oral Q4H PRN Antonieta Pert, MD   2 mg at 07/04/20 0910  . melatonin tablet 3 mg  3 mg Oral QHS Kc, Maren Beach, MD   3 mg at 07/19/20 2105  . metoprolol tartrate (LOPRESSOR) tablet 12.5 mg  12.5 mg Oral BID Dunn, Dayna N, PA-C   12.5 mg at 07/19/20 2106  . multivitamin with minerals tablet 1 tablet  1 tablet Oral Daily Antonieta Pert, MD   1 tablet at 07/20/20 1049  . ondansetron (ZOFRAN) tablet 4 mg  4 mg Oral Q6H PRN Kc, Ramesh, MD   4 mg at 07/19/20 2338   Or  . ondansetron (ZOFRAN) injection 4 mg  4 mg Intravenous Q6H PRN Antonieta Pert, MD   4 mg at 07/17/20 1231  . pantoprazole (PROTONIX) EC tablet 40 mg  40 mg Oral Daily Lenis Noon, RPH   40 mg at 07/20/20 1049  . sodium chloride flush (NS) 0.9 % injection 10-40 mL  10-40 mL Intracatheter PRN Antonieta Pert, MD   10 mL at 07/09/20 0538  . sulfamethoxazole-trimethoprim (BACTRIM DS) 800-160 MG per tablet 2 tablet  2 tablet Oral Q12H Shawna Clamp, MD   2 tablet at 07/20/20 1049     Discharge Medications: Please see discharge summary for a list of discharge medications.  Relevant Imaging Results:  Relevant Lab  Results:   Additional Information SSN [id number]  Ross Ludwig, LCSW

## 2020-07-20 NOTE — Progress Notes (Signed)
PROGRESS NOTE    Tracy Mclaughlin  HQI:[id number] DOB: 03-23-1934 DOA: 06/23/2020 PCP: [name], PA    Brief Narrative:  This patient is 86 y.o.femalewith medical history significant forlow-grade lymphoma without significant response to chemotherapy last year and was recently started onBTK INHIBITORby Dr. [name], hypothyroidism, history of A. fib, pancytopenia presents to the ED with3-4 day history of nausea vomiting and diarrhea and abdominal pain-and right upper quadrant pain.symptoms started after new chemo on Thursday.Patient denies any fever,  chest pain.vomitted x 3 today and had non bloody diarrhea 4 times today.  ED Course:Hemodynamically stable, afebrile, UA positive for UTI, CT scan abdomen done that showed free fluid with ascites and mesenteric edema, case was discussed Dr. [name] from oncology,  advised UTI treatment with Zosyn .  Patient  was admitted, being managed symptomatically, chemo was held,  diarrhea improved subsequently,  oncology put the patient back on chemo overall tolerating well. Having nausea anorexia suspecting this is related to her lymphoma. Patient has been started on prednisone to help with appetite and also received trazodone for sleep. Patient remains deconditioned.  She was found to get more sleepy and confused subsequently trazodone and prednisone were discontinued with resolution of her confusion. Patient was also noticed to have shortness of breath/dyspnea with activity-imaging studies showed pleural effusion underwent right-sided thoracentesis with 1.6 L of blood-tinged fluid removal, cytology reactive medically stable, no growth on culture. PT had suggested skilled nursing facility,  patient had PICC line, plan:  SNF as well as home health.  Patient went into rapid tachycardia supraventricular 7/19 a.m. and also with hypoxia-underwent CTA chest no PE but right-sided pleural effusion atelectasis/consolidation-cardiologist consulted.   Patient converted to sinus rhythm spontaneously. received few days of IV antibiotics , Procalcitonin has been negative/normal- no fever no leukocytosis. Patient continues to remain hypoxic and in respiratory failure, chest x-ray shows reaccumulation of the right pleural effusion.  Pulmonology consulted for repeat thoracocentesis or potentially Pleurx catheter. She underwent thoracocentesis 1400 cc of fluid removed sent for cytology flow cytometry. Oncology resumed acalabrutinib plan if she does not improve would recommend hospice. Patient seems not interested in physical therapy.   Assessment & Plan:   Principal Problem:   Recurrent pleural effusion on right Active Problems:   Non Hodgkin's lymphoma (HCC)   Hypertension   Anemia, iron deficiency   PAF (paroxysmal atrial fibrillation) (HCC)   Anemia   Pancytopenia (HCC)   Nausea & vomiting   UTI (urinary tract infection)   Ascites   Acute respiratory failure with hypoxia and hypercapnia (HCC)  Acute hypoxic respiratory failure due to pleural effusion / No PE on the CT. -Stable, still requiring 4 L of oxygen, to maintain O2 sat greater than 92% -Currently satting 97% -Pleural cultures are growing gram-positive cocci - on  broad-spectrum antibiotics  Vanco and Zosyn  -we will modify and narrow down antibiotics according to final cultures and sensitivity. -Pleural fluid grew staph epi, we will continue with vancomycin and DC Zosyn.  -Remains in respiratory distress,  O2 dependent, hypoxic,  worsening shortness of breath with minimal exertion - Recurrent pleural effusion -07/08/2020 US guidedrightthoracentesis. Yielded500 mLof redfluid. CXR: 1. Increase in volume of right pleural effusion with mild interstitial edema 2. Bilateral airspace opacities concerning for multifocal pneumonia.  -Continue gentle diuretics -As needed duo nebs  -Low suspicion for pneumonia, sepsis. - chest x-ray shows reaccumulation of the right pleural  effusion.   - Pulmonology consulted for repeat thoracocentesis or potentially Pleurx catheter. -She underwent thoracocentesis,  1400 cc fluid drained, sent for flow cytometry, cytology and other studies. -Patient seems not interested in physical therapy.  She is out of bed to the chair.  Intractable nausea vomitingdiarrhea andabdominal pain: -Resolved -Unclear etiology suspecting due to [place] held on admission and resumed 7/8. Diarrhea improved.   Acalabrutinib  was again discontinued as her LFTs were worsening. Has new onset ascites on the CT scan-but too small to tap on the ultrasound . Cont supportive measure.  - Prednisone was added to stimulate appetite but discontinued due to confusion  Recurrent pleural effusion: -Likely from  Lymphoma Vs infection or 2/2 Acalabrutinib ( less likely per oncology) . S/p rt thoracentesis 1.6 L blood-tinged fluid was removed.  Pleural fluid Gram stain no organism, culture no growth so far, cytology reactive mesothelial cell.   CTA shows moderate right and mild to moderate left pleural effusions-BNP around 400  -Echo shows preserved EJF 60 to 65%.  no diastolic dysfunction, unclear if related to CHF versus low Albumin.   Cardiology on board will start trial of IV Lasix low-dose, I's and O's: +10.1 L  - ordered US guided thoracentesis along with labs/cytology, discussed with cardio and  Oncology.  Narrow complex tachycardia - Resolved.  Converted to sinus rhythm.  No recurrent episode -remained stable- Cardiology following closely -Continue diltiazem CD 180 nightly, Lopressor 12.5 mg p.o. twice daily, -Following thyroid function -2D echocardiogram reviewed.  Ascites: Stable. -new onset ascites on the CT scan-too small to tap on paracentesis per IR.  Abnormal LFTS: -Improving 2/2 Acalabrutinib-which is discontinued.  LFTs improving. RUQ [place]- possible cirrhotic changes, hepatitis panel negative.   Acute metabolic encephalopathy  from polypharmacy: -Mentation back to baseline.  had  confusion/Lethargy: 7/14 a.m likley polypharmacy and pt feels improved after stopping trazodone/prednisone.   -Continue to complain of insomnia, sleeping medication as needed  She is on Xanax low-dose continue for anxiety.  Continue supportive care.   E coli UTI POA: Completed 7 days of antibiotics.   Non Hodgkin's lymphoma -Remained stable, -Oncology following -with progressive decline, patientwasstartedpast weekonAcalabrutinib she did not have significant response to chemotherapy last year.Acalabrutinibwas held on admission and resumed  But again stopped due to worsening LFTs by oncology.  Oncology resumed acalabrutinib plan if she does not improve would recommend hospice. Monitor liver enzymes while being on acalabrutinib.   Failure to thrive/deconditioning: Remains weak and deconditioned.  -PT continues to recommend SNF -She refused SNF.  She is high risk for readmission.  Husband concerned about poor support. -Patient seems not interested in physical therapy.  Encouraged to get out of bed to chair and participate in therapy.  Hypertension: BP is controlled.  Continue Cardizem.    Pancytopenia with anemia/Thrombocytopenia in the setting of lymphoma/chemo: -Hemoglobin and WBC count overall stable.  Monitor.  Heparin was discontinued by hematology due to bilateral lower leg bruise and thrombocytopenia.    History of intermittent atrial fibrillation on Cardizem: - Not on anticoagulation due to history of subarachnoid hemorrhage as per cardiology. Bruise on the skin heparin discontinued per oncology. improving.   DVT prophylaxis: SCDS Code Status: DNR Family Communication: Discussed with patient today.    Status is: Inpatient Remains inpatient appropriate because: Patient remains deconditioned weak, hypoxic.  Dispo: The patient is from: Home   Anticipated d/c is to: SNF as per PT- pt declines SNF and  HH.  Continued to engage on placement recomendations.  Anticipated d/c date is: 2 days.  Once shortness is better, and cleared by her  oncologist. discussed with her oncology   Patient currently not medically stable.   Consultants:   Hematology, Pulmonology  Procedures: Right thoracocentesis 7/29.  Antimicrobials: Anti-infectives (From admission, onward)   Start     Dose/Rate Route Frequency Ordered Stop   07/19/20 1800  sulfamethoxazole-trimethoprim (BACTRIM DS) 800-160 MG per tablet 2 tablet     Discontinue     2 tablet Oral Every 12 hours 07/19/20 1509     07/17/20 1800  vancomycin (VANCOREADY) IVPB 500 mg/100 mL  Status:  Discontinued        500 mg 100 mL/hr over 60 Minutes Intravenous Every 12 hours 07/17/20 0708 07/19/20 1509   07/10/20 1600  vancomycin (VANCOREADY) IVPB 750 mg/150 mL  Status:  Discontinued        750 mg 150 mL/hr over 60 Minutes Intravenous Every 12 hours 07/10/20 1332 07/17/20 0708   07/10/20 1430  piperacillin-tazobactam (ZOSYN) IVPB 3.375 g  Status:  Discontinued        3.375 g 12.5 mL/hr over 240 Minutes Intravenous Every 8 hours 07/10/20 1332 07/15/20 1333   07/06/20 1800  cefTRIAXone (ROCEPHIN) 1 g in sodium chloride 0.9 % 100 mL IVPB  Status:  Discontinued        1 g 200 mL/hr over 30 Minutes Intravenous Every 24 hours 07/06/20 1646 07/08/20 1037   07/06/20 1800  azithromycin (ZITHROMAX) tablet 500 mg  Status:  Discontinued        500 mg Oral Daily 07/06/20 1646 07/08/20 1037   06/27/20 0830  cefTRIAXone (ROCEPHIN) 1 g in sodium chloride 0.9 % 100 mL IVPB  Status:  Discontinued        1 g 200 mL/hr over 30 Minutes Intravenous Every 24 hours 06/27/20 0733 06/30/20 1340   06/24/20 0200  piperacillin-tazobactam (ZOSYN) IVPB 3.375 g  Status:  Discontinued        3.375 g 12.5 mL/hr over 240 Minutes Intravenous Every 8 hours 06/23/20 1703 06/27/20 0733   06/23/20 1645  piperacillin-tazobactam (ZOSYN) IVPB 3.375 g        3.375 g 100  mL/hr over 30 Minutes Intravenous  Once 06/23/20 1638 06/23/20 1834   06/23/20 1615  cefTRIAXone (ROCEPHIN) 1 g in sodium chloride 0.9 % 100 mL IVPB  Status:  Discontinued        1 g 200 mL/hr over 30 Minutes Intravenous  Once 06/23/20 1607 06/23/20 1608     Subjective: Patient was seen and examined at bedside.  No overnight events.  She appears comfortable but continues to have shortness of breath on exertion.  Encouraged to participate which will increase her strength and endurance.  She seems very depressed not interested in physical therapy.  Objective: Vitals:   07/19/20 2104 07/20/20 0635 07/20/20 1109 07/20/20 1348  BP: (!) 107/49 (!) 110/48 (!) 113/47 (!) 110/43  Pulse: 81 62 74 80  Resp: 17 16  18   Temp: 98.4 F (36.9 C) 98 F (36.7 C)  98.5 F (36.9 C)  TempSrc: Oral Oral  Oral  SpO2: 100% 93%  93%  Weight:  63 kg    Height:        Intake/Output Summary (Last 24 hours) at 07/20/2020 1404 Last data filed at 07/20/2020 0659 Gross per 24 hour  Intake --  Output 950 ml  Net -950 ml   Filed Weights   07/16/20 0614 07/19/20 0500 07/20/20 0635  Weight: 68.8 kg 66.6 kg 63 kg    Examination:  General exam: Appears calm and comfortable  Respiratory  system: Clear to auscultation. Respiratory effort normal.  Porta catheter on the chest. Cardiovascular system: S1 & S2 heard, RRR. No JVD, murmurs, rubs, gallops or clicks. No pedal edema. Gastrointestinal system: Abdomen is nondistended, soft and nontender. No organomegaly or masses felt. Normal bowel sounds heard. Central nervous system: Alert and oriented. No focal neurological deficits. Extremities:  No pain, Bruising noted on LE Skin: No rashes, lesions or ulcers Psychiatry: Judgement and insight appear normal. Mood & affect appropriate.     Data Reviewed: I have personally reviewed following labs and imaging studies  CBC: Recent Labs  Lab 07/15/20 0408 07/16/20 0543 07/17/20 0433 07/18/20 0325 07/19/20 0354   WBC 5.7 5.2 4.9 6.2 7.1  HGB 10.4* 9.9* 10.0* 10.4* 10.3*  HCT 32.2* 30.1* 31.1* 32.3* 32.3*  MCV 104.9* 102.7* 105.8* 104.9* 104.5*  PLT 152 152 154 148* 867   Basic Metabolic Panel: Recent Labs  Lab 07/15/20 0408 07/16/20 0543 07/17/20 0433 07/18/20 0325 07/19/20 0354  NA 138 138 140 136 138  K 3.1* 2.5* 4.0 4.5 3.8  CL 87* 88* 91* 94* 96*  CO2 40* 37* 37* 33* 33*  GLUCOSE 102* 104* 98 114* 112*  BUN 13 16 14 12 15   CREATININE 0.75 0.80 0.66 0.76 0.76  CALCIUM 7.2* 7.2* 7.7* 7.8* 8.2*  MG  --  1.5* 2.4  --   --   PHOS  --   --  2.5  --   --    GFR: Estimated Creatinine Clearance: 53 mL/min (by C-G formula based on SCr of 0.76 mg/dL). Liver Function Tests: Recent Labs  Lab 07/17/20 0433 07/18/20 0325 07/19/20 0354  AST 55* 53* 50*  ALT 31 29 31   ALKPHOS 143* 147* 141*  BILITOT 1.1 1.3* 1.5*  PROT 4.9* 5.2* 5.4*  ALBUMIN 2.5* 2.6* 2.6*   No results for input(s): LIPASE, AMYLASE in the last 168 hours. No results for input(s): AMMONIA in the last 168 hours. Coagulation Profile: No results for input(s): INR, PROTIME in the last 168 hours. Cardiac Enzymes: No results for input(s): CKTOTAL, CKMB, CKMBINDEX, TROPONINI in the last 168 hours. BNP (last 3 results) No results for input(s): PROBNP in the last 8760 hours. HbA1C: No results for input(s): HGBA1C in the last 72 hours. CBG: No results for input(s): GLUCAP in the last 168 hours. Lipid Profile: No results for input(s): CHOL, HDL, LDLCALC, TRIG, CHOLHDL, LDLDIRECT in the last 72 hours. Thyroid Function Tests: No results for input(s): TSH, T4TOTAL, FREET4, T3FREE, THYROIDAB in the last 72 hours. Anemia Panel: No results for input(s): VITAMINB12, FOLATE, FERRITIN, TIBC, IRON, RETICCTPCT in the last 72 hours. Sepsis Labs: No results for input(s): PROCALCITON, LATICACIDVEN in the last 168 hours.  Recent Results (from the past 240 hour(s))  Body fluid culture (includes gram stain)     Status: None (Preliminary  result)   Collection Time: 07/17/20  5:23 PM   Specimen: Pleural Fluid  Result Value Ref Range Status   Specimen Description   Final    PLEURAL Performed at [facility] [address] [place], [place] [address]    Special Requests   Final    NONE Performed at [facility], [place] [address], [place], [place] [address]    Gram Stain   Final    MODERATE WBC PRESENT, PREDOMINANTLY MONONUCLEAR NO ORGANISMS SEEN    Culture   Final    NO GROWTH 3 DAYS Performed at [facility] [address] [place], [place] [address]  Report Status PENDING  Incomplete     Radiology Studies: No results found.  Scheduled Meds: . acalabrutinib  100 mg Oral QHS  . Chlorhexidine Gluconate Cloth  6 each Topical Daily  . citalopram  20 mg Oral Daily  . diltiazem  180 mg Oral QHS  . feeding supplement (KATE FARMS STANDARD 1.4)  325 mL Oral Daily  . feeding supplement (PRO-STAT SUGAR FREE 64)  30 mL Oral Daily  . furosemide  20 mg Intravenous BID  . heparin lock flush  500 Units Intracatheter Q30 days  . levothyroxine  224 mcg Oral Q0600  . melatonin  3 mg Oral QHS  . metoprolol tartrate  12.5 mg Oral BID  . multivitamin with minerals  1 tablet Oral Daily  . pantoprazole  40 mg Oral Daily  . sulfamethoxazole-trimethoprim  2 tablet Oral Q12H   Continuous Infusions: . sodium chloride 250 mL (07/18/20 0615)     LOS: 26 days    Time spent: 25 mins.    Shawna Clamp, MD [facility]   If 7PM-7AM, please contact night-coverage

## 2020-07-20 NOTE — Progress Notes (Signed)
Pt voicing this shift she wanted to ambulate with this RN at 1300. Ambulation performed with front wheel walker and moderate assistance. Pt was able to walk 15 feet out in to the hall before voicing she was too tired to continue and then ambulated back to the bed. When asked if she would like to sit in the chair she said she would prefer the bed. Pt helped back to bed and is now resting comfortably in no acute distress.

## 2020-07-21 LAB — BODY FLUID CULTURE: Culture: NO GROWTH

## 2020-07-21 LAB — COMPREHENSIVE METABOLIC PANEL
ALT: 25 U/L (ref 0–44)
AST: 44 U/L — ABNORMAL HIGH (ref 15–41)
Albumin: 2.6 g/dL — ABNORMAL LOW (ref 3.5–5.0)
Alkaline Phosphatase: 141 U/L — ABNORMAL HIGH (ref 38–126)
Anion gap: 11 (ref 5–15)
BUN: 18 mg/dL (ref 8–23)
CO2: 29 mmol/L (ref 22–32)
Calcium: 8 mg/dL — ABNORMAL LOW (ref 8.9–10.3)
Chloride: 96 mmol/L — ABNORMAL LOW (ref 98–111)
Creatinine, Ser: 1.26 mg/dL — ABNORMAL HIGH (ref 0.44–1.00)
GFR calc Af Amer: 46 mL/min — ABNORMAL LOW (ref >60)
GFR calc non Af Amer: 39 mL/min — ABNORMAL LOW (ref >60)
Glucose, Bld: 96 mg/dL (ref 70–99)
Potassium: 3.5 mmol/L (ref 3.5–5.1)
Sodium: 136 mmol/L (ref 135–145)
Total Bilirubin: 1 mg/dL (ref 0.3–1.2)
Total Protein: 5.3 g/dL — ABNORMAL LOW (ref 6.5–8.1)

## 2020-07-21 LAB — CBC WITH DIFFERENTIAL/PLATELET
Abs Immature Granulocytes: 0.23 10*3/uL — ABNORMAL HIGH (ref 0.00–0.07)
Basophils Absolute: 0 10*3/uL (ref 0.0–0.1)
Basophils Relative: 1 %
Eosinophils Absolute: 0 10*3/uL (ref 0.0–0.5)
Eosinophils Relative: 1 %
HCT: 29.3 % — ABNORMAL LOW (ref 36.0–46.0)
Hemoglobin: 9.8 g/dL — ABNORMAL LOW (ref 12.0–15.0)
Immature Granulocytes: 4 %
Lymphocytes Relative: 39 %
Lymphs Abs: 2.5 10*3/uL (ref 0.7–4.0)
MCH: 34.1 pg — ABNORMAL HIGH (ref 26.0–34.0)
MCHC: 33.4 g/dL (ref 30.0–36.0)
MCV: 102.1 fL — ABNORMAL HIGH (ref 80.0–100.0)
Monocytes Absolute: 0.6 10*3/uL (ref 0.1–1.0)
Monocytes Relative: 10 %
Neutro Abs: 3 10*3/uL (ref 1.7–7.7)
Neutrophils Relative %: 45 %
Platelets: 165 10*3/uL (ref 150–400)
RBC: 2.87 MIL/uL — ABNORMAL LOW (ref 3.87–5.11)
RDW: 15 % (ref 11.5–15.5)
WBC: 6.4 10*3/uL (ref 4.0–10.5)
nRBC: 0 % (ref 0.0–0.2)

## 2020-07-21 LAB — LACTATE DEHYDROGENASE: LDH: 283 U/L — ABNORMAL HIGH (ref 98–192)

## 2020-07-21 NOTE — Progress Notes (Signed)
PROGRESS NOTE    Tracy Mclaughlin  ZDG:[id number] DOB: 1934/04/30 DOA: 06/23/2020 PCP: [name], PA    Brief Narrative:  This patient is 86 y.o.femalewith medical history significant forlow-grade lymphoma without significant response to chemotherapy last year and was recently started onBTK INHIBITORby Dr. [name], hypothyroidism, history of A. fib, pancytopenia presents to the ED with3-4 day history of nausea vomiting and diarrhea and abdominal pain-and right upper quadrant pain.symptoms started after new chemo on Thursday.Patient denies any fever,  chest pain.vomitted x 3 today and had non bloody diarrhea 4 times today. She was hemodynamically stable, afebrile, UA positive for UTI, CT scan abdomen done that showed free fluid with ascites and mesenteric edema, case was discussed Dr. [name] from oncology,  advised UTI treatment with Zosyn . Patient  was admitted, being managed symptomatically, chemo was held,  diarrhea improved subsequently,  oncology put the patient back on chemo overall tolerating well. Having nausea anorexia suspecting this is related to her lymphoma. Patient has been started on prednisone to help with appetite and also received trazodone for sleep. Patient remains deconditioned.  Patient was also noticed to have shortness of breath/dyspnea with activity-imaging studies showed pleural effusion underwent right-sided thoracentesis x 2  with 1.6 L of blood-tinged fluid removal, cytology reactive mesothelial celss, no growth on culture. Patient went into rapid tachycardia supraventricular 7/19 a.m. and also with hypoxia-underwent CTA chest : No PE but right-sided pleural effusion atelectasis/consolidation-cardiologist consulted.  Patient converted to sinus rhythm spontaneously. received few days of IV antibiotics , Procalcitonin has been negative/normal- no fever no leukocytosis. Patient continues to remain hypoxic and in respiratory failure, chest x-ray shows  reaccumulation of the right pleural effusion.  Pulmonology consulted for repeat thoracocentesis or potentially Pleurx catheter. She underwent thoracocentesis 1400 cc of fluid removed sent for cytology flow cytometry. Oncology resumed acalabrutinib plan if she does not improve would recommend hospice.  Patient seems to be improving, she has been refusing skilled nursing facility and home with home services.  After multiple conversations she finally agreed for skilled nursing facility awaiting insurance authorization.   Assessment & Plan:   Principal Problem:   Recurrent pleural effusion on right Active Problems:   Non Hodgkin's lymphoma (HCC)   Hypertension   Anemia, iron deficiency   PAF (paroxysmal atrial fibrillation) (HCC)   Anemia   Pancytopenia (HCC)   Nausea & vomiting   UTI (urinary tract infection)   Ascites   Acute respiratory failure with hypoxia and hypercapnia (HCC)  Acute hypoxic respiratory failure due to pleural effusion / No PE on the CT. - Stable, still requiring 1-2 L of oxygen, to maintain O2 sat greater than 92% - Currently satting 96%. - Underwent thoracocentesis twice. - Pleural cultures grew gram-positive cocci. - initiated on  broad-spectrum antibiotics  Vanco and Zosyn . - narrowed down antibiotics according to final cultures and sensitivity. - Pleural fluid grew staph epi, continued with vancomycin and DC Zosyn. - Continue gentle diuretics - As needed duo nebs  - Low suspicion for pneumonia, sepsis. - Pulmonology consulted for repeat thoracocentesis or potentially Pleurx catheter. - She underwent thoracocentesis, 1400 cc fluid drained, sent for flow cytometry, cytology and other studies. - Cultures sensitive to Bactrim ,  antibiotics changed. -Patient is very deconditioned, encouraged out of bed to chair.  Intractable nausea vomitingdiarrhea andabdominal pain: -Resolved - Unclear etiology suspecting due to [place] held on  admission and resumed 7/8. Diarrhea improved.   Acalabrutinib  was again discontinued as her LFTs  were worsening. Has new onset ascites on the CT scan-but too small to tap on the ultrasound . Cont supportive measure.  - Prednisone was added to stimulate appetite but discontinued due to confusion.  Recurrent pleural effusion: -Likely from  Lymphoma Vs infection or 2/2 Acalabrutinib ( less likely per oncology) . S/p rt thoracentesis 1.6 L blood-tinged fluid was removed.  Pleural fluid Gram stain no organism, culture no growth so far, cytology:  reactive mesothelial cell.  -CTA shows moderate right and mild to moderate left pleural effusions-BNP around 400  -Echo shows preserved EJF 60 to 65%.  no diastolic dysfunction, unclear if related to CHF versus low Albumin.     Narrow complex tachycardia ->>>Resolved.  Converted to sinus rhythm.  No recurrent episode -remained stable- Cardiology following  -Continue diltiazem CD 180 nightly, Lopressor 12.5 mg p.o. twice daily, -Following thyroid function -2D echocardiogram reviewed.  Ascites: Stable. -new onset ascites on the CT scan-too small to tap on paracentesis per IR.  Abnormal LFTS: - Improving 2/2 Acalabrutinib-which is discontinued.   LFTs improving. RUQ [place]- possible cirrhotic changes, hepatitis panel negative.  Acalabrutinib resumed, continue to monitor liver enzymes  Acute metabolic encephalopathy from polypharmacy: >>> Resolved -Mentation back to baseline.  had  confusion/Lethargy: 7/14 a.m likley polypharmacy and pt feels improved after stopping trazodone/prednisone.   -Continue to complain of insomnia, sleeping medication as needed  She is on Xanax low-dose continue for anxiety.  Continue supportive care.   E coli UTI POA: Completed 7 days of antibiotics.   Non Hodgkin's lymphoma -Remained stable, -Oncology following -with progressive decline, patientwasstartedpast weekonAcalabrutinib she did not have significant response to  chemotherapy last year.Acalabrutinibwas held on admission and resumed  but again stopped due to worsening LFTs by oncology.  Oncology resumed acalabrutinib plan if she does not improve would recommend hospice. Monitor liver enzymes while being on acalabrutinib.   Failure to thrive/deconditioning: Remains weak and deconditioned.  -PT continues to recommend SNF -She refused SNF.  She is high risk for readmission.  Husband concerned about poor support. -Patient seems not interested in physical therapy.  Encouraged to get out of bed to chair and participate in therapy. -After multiple conversations patient has finally agreed for skilled nursing facility. Case manager notified,  authorization in process  Hypertension: BP is controlled.  Continue Cardizem.    Pancytopenia with anemia/Thrombocytopenia in the setting of lymphoma/chemo: -Hemoglobin and WBC count overall stable.  Monitor.  Heparin was discontinued by hematology due to bilateral lower leg bruise and thrombocytopenia.    History of intermittent atrial fibrillation on Cardizem: - Not on anticoagulation due to history of subarachnoid hemorrhage as per cardiology. Bruise on the skin heparin discontinued per oncology. improving.   DVT prophylaxis: SCDS Code Status: DNR Family Communication: Discussed with patient today.    Status is: Inpatient Remains inpatient appropriate because: Patient remains deconditioned weak, hypoxic.   Dispo: The patient is from: Home   Anticipated d/c is to: SNF as per PT- pt declines SNF and HH.  Continued to engage on placement recomendations.  Anticipated d/c date is: 2 days.  Once shortness is better, and cleared by her oncologist. discussed with her oncology   Patient finally agreed for a skilled nursing facility awaiting authorization.   Consultants:   Hematology, Pulmonology  Procedures: Right thoracocentesis 7/29.  Antimicrobials: Anti-infectives  (From admission, onward)   Start     Dose/Rate Route Frequency Ordered Stop   07/19/20 1800  sulfamethoxazole-trimethoprim (BACTRIM DS) 800-160 MG per tablet 2 tablet  Discontinue     2 tablet Oral Every 12 hours 07/19/20 1509     07/17/20 1800  vancomycin (VANCOREADY) IVPB 500 mg/100 mL  Status:  Discontinued        500 mg 100 mL/hr over 60 Minutes Intravenous Every 12 hours 07/17/20 0708 07/19/20 1509   07/10/20 1600  vancomycin (VANCOREADY) IVPB 750 mg/150 mL  Status:  Discontinued        750 mg 150 mL/hr over 60 Minutes Intravenous Every 12 hours 07/10/20 1332 07/17/20 0708   07/10/20 1430  piperacillin-tazobactam (ZOSYN) IVPB 3.375 g  Status:  Discontinued        3.375 g 12.5 mL/hr over 240 Minutes Intravenous Every 8 hours 07/10/20 1332 07/15/20 1333   07/06/20 1800  cefTRIAXone (ROCEPHIN) 1 g in sodium chloride 0.9 % 100 mL IVPB  Status:  Discontinued        1 g 200 mL/hr over 30 Minutes Intravenous Every 24 hours 07/06/20 1646 07/08/20 1037   07/06/20 1800  azithromycin (ZITHROMAX) tablet 500 mg  Status:  Discontinued        500 mg Oral Daily 07/06/20 1646 07/08/20 1037   06/27/20 0830  cefTRIAXone (ROCEPHIN) 1 g in sodium chloride 0.9 % 100 mL IVPB  Status:  Discontinued        1 g 200 mL/hr over 30 Minutes Intravenous Every 24 hours 06/27/20 0733 06/30/20 1340   06/24/20 0200  piperacillin-tazobactam (ZOSYN) IVPB 3.375 g  Status:  Discontinued        3.375 g 12.5 mL/hr over 240 Minutes Intravenous Every 8 hours 06/23/20 1703 06/27/20 0733   06/23/20 1645  piperacillin-tazobactam (ZOSYN) IVPB 3.375 g        3.375 g 100 mL/hr over 30 Minutes Intravenous  Once 06/23/20 1638 06/23/20 1834   06/23/20 1615  cefTRIAXone (ROCEPHIN) 1 g in sodium chloride 0.9 % 100 mL IVPB  Status:  Discontinued        1 g 200 mL/hr over 30 Minutes Intravenous  Once 06/23/20 1607 06/23/20 1608     Subjective: Patient was seen and examined at bedside.  No overnight events.  She appears comfortable  but continues to have shortness of breath on exertion.  Encouraged to participate in physical therapy which will increase her strength and endurance.   Objective: Vitals:   07/21/20 1119 07/21/20 1306 07/21/20 1306 07/21/20 1518  BP: (!) 123/45 (!) 101/52 (!) 101/52 (!) 98/37  Pulse: 84 65 65 64  Resp:    18  Temp:  98.4 F (36.9 C) 98.4 F (36.9 C) 98.7 F (37.1 C)  TempSrc:  Oral Oral Oral  SpO2:  91% 91% 93%  Weight:      Height:        Intake/Output Summary (Last 24 hours) at 07/21/2020 1533 Last data filed at 07/21/2020 1500 Gross per 24 hour  Intake 472.94 ml  Output 650 ml  Net -177.06 ml   Filed Weights   07/19/20 0500 07/20/20 0635 07/21/20 0500  Weight: 66.6 kg 63 kg 66.1 kg    Examination:  General exam: Appears calm and comfortable  Respiratory system: Clear to auscultation. Respiratory effort normal.  Porta catheter on the chest. Cardiovascular system: S1 & S2 heard, RRR. No JVD, murmurs, rubs, gallops or clicks. No pedal edema. Gastrointestinal system: Abdomen is nondistended, soft and nontender. No organomegaly or masses felt. Normal bowel sounds heard. Central nervous system: Alert and oriented. No focal neurological deficits. Extremities:  No pain, Bruising noted on LE Skin:  No rashes, lesions or ulcers Psychiatry: Judgement and insight appear normal. Mood & affect appropriate.     Data Reviewed: I have personally reviewed following labs and imaging studies  CBC: Recent Labs  Lab 07/16/20 0543 07/17/20 0433 07/18/20 0325 07/19/20 0354 07/21/20 0915  WBC 5.2 4.9 6.2 7.1 6.4  NEUTROABS  --   --   --   --  3.0  HGB 9.9* 10.0* 10.4* 10.3* 9.8*  HCT 30.1* 31.1* 32.3* 32.3* 29.3*  MCV 102.7* 105.8* 104.9* 104.5* 102.1*  PLT 152 154 148* 168 322   Basic Metabolic Panel: Recent Labs  Lab 07/16/20 0543 07/17/20 0433 07/18/20 0325 07/19/20 0354 07/21/20 0915  NA 138 140 136 138 136  K 2.5* 4.0 4.5 3.8 3.5  CL 88* 91* 94* 96* 96*  CO2 37* 37*  33* 33* 29  GLUCOSE 104* 98 114* 112* 96  BUN 16 14 12 15 18   CREATININE 0.80 0.66 0.76 0.76 1.26*  CALCIUM 7.2* 7.7* 7.8* 8.2* 8.0*  MG 1.5* 2.4  --   --   --   PHOS  --  2.5  --   --   --    GFR: Estimated Creatinine Clearance: 34.1 mL/min (A) (by C-G formula based on SCr of 1.26 mg/dL (H)). Liver Function Tests: Recent Labs  Lab 07/17/20 0433 07/18/20 0325 07/19/20 0354 07/21/20 0915  AST 55* 53* 50* 44*  ALT 31 29 31 25   ALKPHOS 143* 147* 141* 141*  BILITOT 1.1 1.3* 1.5* 1.0  PROT 4.9* 5.2* 5.4* 5.3*  ALBUMIN 2.5* 2.6* 2.6* 2.6*   No results for input(s): LIPASE, AMYLASE in the last 168 hours. No results for input(s): AMMONIA in the last 168 hours. Coagulation Profile: No results for input(s): INR, PROTIME in the last 168 hours. Cardiac Enzymes: No results for input(s): CKTOTAL, CKMB, CKMBINDEX, TROPONINI in the last 168 hours. BNP (last 3 results) No results for input(s): PROBNP in the last 8760 hours. HbA1C: No results for input(s): HGBA1C in the last 72 hours. CBG: No results for input(s): GLUCAP in the last 168 hours. Lipid Profile: No results for input(s): CHOL, HDL, LDLCALC, TRIG, CHOLHDL, LDLDIRECT in the last 72 hours. Thyroid Function Tests: No results for input(s): TSH, T4TOTAL, FREET4, T3FREE, THYROIDAB in the last 72 hours. Anemia Panel: No results for input(s): VITAMINB12, FOLATE, FERRITIN, TIBC, IRON, RETICCTPCT in the last 72 hours. Sepsis Labs: No results for input(s): PROCALCITON, LATICACIDVEN in the last 168 hours.  Recent Results (from the past 240 hour(s))  Body fluid culture (includes gram stain)     Status: None   Collection Time: 07/17/20  5:23 PM   Specimen: Pleural Fluid  Result Value Ref Range Status   Specimen Description   Final    PLEURAL Performed at [facility] [address], [place], [place] [address]    Special Requests   Final    NONE Performed at [facility] [place] [address]  [place], [place] [address]    Gram Stain   Final    MODERATE WBC PRESENT, PREDOMINANTLY MONONUCLEAR NO ORGANISMS SEEN    Culture   Final    NO GROWTH 3 DAYS Performed at [facility] [address], [place], [place] [address]    Report Status 07/21/2020 FINAL  Final     Radiology Studies: No results found.  Scheduled Meds: . acalabrutinib  100 mg Oral QHS  . Chlorhexidine Gluconate Cloth  6 each Topical Daily  . citalopram  20  mg Oral Daily  . diltiazem  180 mg Oral QHS  . furosemide  20 mg Intravenous BID  . heparin lock flush  500 Units Intracatheter Q30 days  . levothyroxine  224 mcg Oral Q0600  . melatonin  3 mg Oral QHS  . metoprolol tartrate  12.5 mg Oral BID  . multivitamin with minerals  1 tablet Oral Daily  . pantoprazole  40 mg Oral Daily  . sulfamethoxazole-trimethoprim  2 tablet Oral Q12H   Continuous Infusions: . sodium chloride 10 mL/hr at 07/21/20 0000     LOS: 27 days    Time spent: 25 mins.    Shawna Clamp, MD [facility]   If 7PM-7AM, please contact night-coverage

## 2020-07-21 NOTE — Progress Notes (Signed)
Per lab, lactic dehydrogenase can be added on. No need to draw additional lab.

## 2020-07-21 NOTE — TOC Initial Note (Addendum)
Transition of Care [facility]) - Initial/Assessment Note    Patient Details  Name: Tracy Mclaughlin MRN: [id number] Date of Birth: 20-Dec-1934  [facility]) CM/SW Contact:    Leeroy Cha, RN Phone Number: 07/21/2020, 8:47 AM  Clinical Narrative:                 s- recurrent pleural effusion. Some what weak and debilitated   but refusing hhc or snf placement foir rehab. Pt is nopw accepting snf placment after talking with md and husband.  Pt fl2 faxed out to [facility], [place] and [place] and all surrounding area. Spoke with husband would like for p[atient to go to [facility] and rehab.  Fl2 sent via the hub to [facility] [place] [facility] [place] [facility] in admissions please info to [phone] information faxed at [phone]. p-follow for dc needs Expected Discharge Plan: Home/Self Care Barriers to Discharge: Continued Medical Work up   Patient Goals and CMS Choice Patient states their goals for this hospitalization and ongoing recovery are:: I weant to go to my home. CMS Medicare.gov Compare Post Acute Care list provided to:: Patient    Expected Discharge Plan and Services Expected Discharge Plan: Home/Self Care   Discharge Planning Services: CM Consult   Living arrangements for the past 2 months: Single Family Home                                      Prior Living Arrangements/Services Living arrangements for the past 2 months: Single Family Home Lives with:: Spouse Patient language and need for interpreter reviewed:: Yes Do you feel safe going back to the place where you live?: Yes      Need for Family Participation in Patient Care: Yes (Comment) Care giver support system in place?: Yes (comment)   Criminal Activity/Legal Involvement Pertinent to Current Situation/Hospitalization: No - Comment as needed  Activities of Daily Living Home Assistive Devices/Equipment: Dentures (specify type),  Eyeglasses, Cane (specify quad or straight), Walker (specify type) (single point cane with a flashlight, front wheeled walker, upper/lower dentures) ADL Screening (condition at time of admission) Patient's cognitive ability adequate to safely complete daily activities?: Yes Is the patient deaf or have difficulty hearing?: No Does the patient have difficulty seeing, even when wearing glasses/contacts?: No Does the patient have difficulty concentrating, remembering, or making decisions?: No Patient able to express need for assistance with ADLs?: Yes Does the patient have difficulty dressing or bathing?: No Independently performs ADLs?: Yes (appropriate for developmental age) Does the patient have difficulty walking or climbing stairs?: Yes (secondary to weakness) Weakness of Legs: Both Weakness of Arms/Hands: None  Permission Sought/Granted                  Emotional Assessment Appearance:: Appears stated age Attitude/Demeanor/Rapport: Guarded Affect (typically observed): Anxious Orientation: : Oriented to Self, Oriented to Place, Oriented to  Time, Oriented to Situation Alcohol / Substance Use: Not Applicable Psych Involvement: No (comment)  Admission diagnosis:  Other ascites [R18.8] Ascites [R18.8] Urinary tract infection without hematuria, site unspecified [N39.0] Patient Active Problem List   Diagnosis Date Noted  . Recurrent pleural effusion on right 07/10/2020  . Acute respiratory failure with hypoxia and hypercapnia ([place]) 07/10/2020  . Nausea & vomiting 06/23/2020  . UTI (urinary tract infection) 06/23/2020  . Ascites 06/23/2020  . Anemia 06/18/2019  . Pancytopenia ([place]) 06/18/2019  . Port-A-Cath  in place 05/18/2019  . Goals of care, counseling/discussion 03/21/2019  . Hypersensitivity reaction 09/29/2016  . PAF (paroxysmal atrial fibrillation) ([place]) 07/07/2016  . Personal history of colonic polyps - cancer and adenoma 04/17/2014  . Anemia, iron deficiency  04/02/2014  . Heme + stool 04/02/2014  . Personal history of colon cancer 04/02/2014  . Primary thyroid cancer ([place]) 05/01/2012  . Colon cancer ([place]) 11/24/2011  . Pulmonary embolism ([place]) 11/24/2011  . Non Hodgkin's lymphoma ([place])   . Subarachnoid hemorrhage ([place])   . Arthritis   . Hypertension    PCP:  [name], PA Pharmacy:   [place], [place] - [address] [place] [address] Phone: [phone] Fax: [phone]     Social Determinants of Health (SDOH) Interventions    Readmission Risk Interventions No flowsheet data found.

## 2020-07-21 NOTE — NC FL2 (Signed)
[place] LEVEL OF CARE SCREENING TOOL     IDENTIFICATION  Patient Name: Tracy Mclaughlin Birthdate: 1934/12/05 Sex: female Admission Date (Current Location): 06/23/2020  [facility] and [place] Number:  Herbalist and Address:  [facility],  [address] [place], [place]      Provider Number: [id number]  Attending Physician Name and Address:  Shawna Clamp, MD  Relative Name and Phone Number:  Metta, Koranda [phone]    Current Level of Care: Hospital Recommended Level of Care: [place] Prior Approval Number:    Date Approved/Denied:   PASRR Number: [id number]  Discharge Plan: SNF    Current Diagnoses: Patient Active Problem List   Diagnosis Date Noted   Recurrent pleural effusion on right 07/10/2020   Acute respiratory failure with hypoxia and hypercapnia (HCC) 07/10/2020   Nausea & vomiting 06/23/2020   UTI (urinary tract infection) 06/23/2020   Ascites 06/23/2020   Anemia 06/18/2019   Pancytopenia ([place]) 06/18/2019   Port-A-Cath in place 05/18/2019   Goals of care, counseling/discussion 03/21/2019   Hypersensitivity reaction 09/29/2016   PAF (paroxysmal atrial fibrillation) ([place]) 07/07/2016   Personal history of colonic polyps - cancer and adenoma 04/17/2014   Anemia, iron deficiency 04/02/2014   Heme + stool 04/02/2014   Personal history of colon cancer 04/02/2014   Primary thyroid cancer ([place]) 05/01/2012   Colon cancer ([place]) 11/24/2011   Pulmonary embolism ([place]) 11/24/2011   Non Hodgkin's lymphoma ([place])    Subarachnoid hemorrhage (HCC)    Arthritis    Hypertension     Orientation RESPIRATION BLADDER Height & Weight     Self, Time, Situation, Place  Normal Incontinent Weight: 66.1 kg Height:  5\' 8"  (172.7 cm)  BEHAVIORAL SYMPTOMS/MOOD NEUROLOGICAL BOWEL NUTRITION STATUS      Continent Diet (Regular diet)  AMBULATORY STATUS COMMUNICATION OF NEEDS Skin   Limited  Assist Verbally                         Personal Care Assistance Level of Assistance  Bathing, Dressing, Feeding Bathing Assistance: Limited assistance Feeding assistance: Independent Dressing Assistance: Limited assistance     Functional Limitations Info  Sight, Hearing, Speech Sight Info: Adequate Hearing Info: Adequate Speech Info: Adequate    SPECIAL CARE FACTORS FREQUENCY  PT (By licensed PT), OT (By licensed OT)     PT Frequency: Minimum 5x a week OT Frequency: Minimum 5x a week            Contractures Contractures Info: Not present    Additional Factors Info  Code Status, Allergies, Psychotropic Code Status Info: DNR Allergies Info: Demerol Psychotropic Info: citalopram (CELEXA) tablet 20 mg         Current Medications (07/21/2020):  This is the current hospital active medication list Current Facility-Administered Medications  Medication Dose Route Frequency Provider Last Rate Last Admin   0.9 %  sodium chloride infusion   Intravenous PRN Kc, Ramesh, MD 10 mL/hr at 07/21/20 0000 Rate Verify at 07/21/20 0000   acalabrutinib (CALQUENCE) capsule 100 mg  100 mg Oral QHS Mikey Bussing R, NP   100 mg at 07/20/20 2021   acetaminophen (TYLENOL) tablet 650 mg  650 mg Oral Q6H PRN Antonieta Pert, MD   650 mg at 07/10/20 2047   Or   acetaminophen (TYLENOL) suppository 650 mg  650 mg Rectal Q6H PRN Kc, Ramesh, MD       albuterol (PROVENTIL) (2.5 MG/3ML) 0.083% nebulizer  solution 2.5 mg  2.5 mg Nebulization Q2H PRN Rise Patience, MD   2.5 mg at 07/09/20 8676   ALPRAZolam Duanne Moron) tablet 0.125 mg  0.125 mg Oral BID PRN Betsy Coder B, MD   0.125 mg at 07/20/20 2022   alum & mag hydroxide-simeth (MAALOX/MYLANTA) 200-200-20 MG/5ML suspension 30 mL  30 mL Oral Q4H PRN Antonieta Pert, MD   30 mL at 06/29/20 0906   Chlorhexidine Gluconate Cloth 2 % PADS 6 each  6 each Topical Daily Kc, Maren Beach, MD   6 each at 07/21/20 1118   citalopram (CELEXA) tablet 20 mg  20 mg  Oral Daily Kc, Ramesh, MD   20 mg at 07/21/20 1117   diltiazem (CARDIZEM CD) 24 hr capsule 180 mg  180 mg Oral QHS Kc, Ramesh, MD   180 mg at 07/18/20 2229   feeding supplement (KATE FARMS STANDARD 1.4) liquid 325 mL  325 mL Oral Daily Shawna Clamp, MD   325 mL at 07/21/20 1118   feeding supplement (PRO-STAT SUGAR FREE 64) liquid 30 mL  30 mL Oral Daily Kc, Ramesh, MD   30 mL at 07/16/20 1423   furosemide (LASIX) injection 20 mg  20 mg Intravenous BID Cherlynn Kaiser A, MD   20 mg at 07/21/20 1118   heparin lock flush 100 unit/mL  500 Units Intracatheter Q30 days Antonieta Pert, MD   500 Units at 07/06/20 1546   And   heparin lock flush 100 unit/mL  500 Units Intracatheter PRN Kc, Maren Beach, MD       levothyroxine (SYNTHROID) tablet 224 mcg  224 mcg Oral Q0600 Antonieta Pert, MD   224 mcg at 07/21/20 0520   loperamide (IMODIUM) capsule 2 mg  2 mg Oral Q4H PRN Kc, Maren Beach, MD   2 mg at 07/04/20 0910   melatonin tablet 3 mg  3 mg Oral QHS Kc, Ramesh, MD   3 mg at 07/20/20 2021   metoprolol tartrate (LOPRESSOR) tablet 12.5 mg  12.5 mg Oral BID Dunn, Dayna N, PA-C   12.5 mg at 07/21/20 1117   multivitamin with minerals tablet 1 tablet  1 tablet Oral Daily Kc, Ramesh, MD   1 tablet at 07/21/20 1117   ondansetron (ZOFRAN) tablet 4 mg  4 mg Oral Q6H PRN Kc, Maren Beach, MD   4 mg at 07/19/20 2338   Or   ondansetron (ZOFRAN) injection 4 mg  4 mg Intravenous Q6H PRN Kc, Maren Beach, MD   4 mg at 07/20/20 2150   pantoprazole (PROTONIX) EC tablet 40 mg  40 mg Oral Daily Lenis Noon, RPH   40 mg at 07/21/20 1116   sodium chloride flush (NS) 0.9 % injection 10-40 mL  10-40 mL Intracatheter PRN Antonieta Pert, MD   10 mL at 07/09/20 0538   sulfamethoxazole-trimethoprim (BACTRIM DS) 800-160 MG per tablet 2 tablet  2 tablet Oral Q12H Shawna Clamp, MD   2 tablet at 07/21/20 1116     Discharge Medications: Please see discharge summary for a list of discharge medications.  Relevant Imaging Results:  Relevant  Lab Results:   Additional Information SSN [id number]  Leeroy Cha, RN

## 2020-07-21 NOTE — Progress Notes (Addendum)
HEMATOLOGY-ONCOLOGY PROGRESS NOTE  SUBJECTIVE: The patient states that she has not hungry this morning.  She denies nausea vomiting.  Denies abdominal pain.  Remains off O2 and denies dyspnea.  She has asked me if she will be discharging to SNF today.  Oncology History  Non Hodgkin's lymphoma ([place])  09/29/2016 Initial Diagnosis   Non Hodgkin's lymphoma ([place])   03/22/2019 - 04/23/2019 Chemotherapy   The patient had palonosetron (ALOXI) injection 0.25 mg, 0.25 mg, Intravenous,  Once, 1 of 4 cycles Administration: 0.25 mg (03/22/2019) riTUXimab (RITUXAN) 800 mg in sodium chloride 0.9 % 250 mL (2.4242 mg/mL) infusion, 375 mg/m2 = 800 mg, Intravenous,  Once, 1 of 4 cycles Administration: 800 mg (03/22/2019) bendamustine (BENDEKA) 150 mg in sodium chloride 0.9 % 50 mL (2.6786 mg/mL) chemo infusion, 70 mg/m2 = 150 mg (100 % of original dose 70 mg/m2), Intravenous,  Once, 1 of 4 cycles Dose modification: 70 mg/m2 (original dose 70 mg/m2, Cycle 1, Reason: Provider Judgment) Administration: 150 mg (03/22/2019), 150 mg (03/23/2019)  for chemotherapy treatment.    05/18/2019 - 06/28/2019 Chemotherapy   The patient had DOXOrubicin (ADRIAMYCIN) chemo injection 82 mg, 40 mg/m2 = 82 mg (100 % of original dose 40 mg/m2), Intravenous,  Once, 2 of 6 cycles Dose modification: 40 mg/m2 (original dose 40 mg/m2, Cycle 1, Reason: Provider Judgment) Administration: 82 mg (05/18/2019), 82 mg (06/08/2019) palonosetron (ALOXI) injection 0.25 mg, 0.25 mg, Intravenous,  Once, 2 of 6 cycles Administration: 0.25 mg (05/18/2019), 0.25 mg (06/08/2019) pegfilgrastim (NEULASTA ONPRO KIT) injection 6 mg, 6 mg, Subcutaneous, Once, 2 of 6 cycles Administration: 6 mg (05/18/2019), 6 mg (06/08/2019) vinCRIStine (ONCOVIN) 1 mg in sodium chloride 0.9 % 50 mL chemo infusion, 1 mg (100 % of original dose 1 mg), Intravenous,  Once, 2 of 6 cycles Dose modification: 1 mg (original dose 1 mg, Cycle 1, Reason: Provider Judgment) Administration: 1 mg  (05/18/2019), 1 mg (06/08/2019) riTUXimab (RITUXAN) 800 mg in sodium chloride 0.9 % 250 mL (2.4242 mg/mL) infusion, 375 mg/m2 = 800 mg, Intravenous,  Once, 2 of 6 cycles Administration: 800 mg (05/18/2019), 800 mg (06/08/2019) cyclophosphamide (CYTOXAN) 1,240 mg in sodium chloride 0.9 % 250 mL chemo infusion, 600 mg/m2 = 1,540 mg, Intravenous,  Once, 2 of 6 cycles Dose modification: 600 mg/m2 (original dose 750 mg/m2, Cycle 2, Reason: Provider Judgment) Administration: 1,240 mg (05/18/2019), 1,240 mg (06/08/2019) fosaprepitant (EMEND) 150 mg, dexamethasone (DECADRON) 12 mg in sodium chloride 0.9 % 145 mL IVPB, , Intravenous,  Once, 2 of 6 cycles Administration:  (05/18/2019),  (06/08/2019)  for chemotherapy treatment.    07/19/2019 -  Chemotherapy   The patient had riTUXimab (RITUXAN) 700 mg in sodium chloride 0.9 % 250 mL (2.1875 mg/mL) infusion, 375 mg/m2 = 700 mg, Intravenous,  Once, 11 of 11 cycles Administration: 700 mg (07/19/2019), 700 mg (08/16/2019), 700 mg (09/13/2019), 700 mg (10/10/2019), 700 mg (11/07/2019), 700 mg (12/07/2019), 700 mg (01/04/2020), 700 mg (02/01/2020), 700 mg (03/05/2020), 700 mg (04/02/2020), 700 mg (04/30/2020) riTUXimab-pvvr (RUXIENCE) 700 mg in sodium chloride 0.9 % 250 mL (2.1875 mg/mL) infusion, 375 mg/m2 = 700 mg (original dose ), Intravenous,  Once, 0 of 1 cycle Dose modification: 375 mg/m2 (Cycle 12)  for chemotherapy treatment.     PHYSICAL EXAMINATION:  Vitals:   07/20/20 2036 07/21/20 0525  BP: (!) 114/43 (!) 121/51  Pulse: 78 71  Resp: 18 19  Temp: 98.2 F (36.8 C) 98.4 F (36.9 C)  SpO2: 92% (!) 89%   [employer]  07/19/20 0500 07/20/20 0635 07/21/20 0500  Weight: 66.6 kg 63 kg 66.1 kg    Intake/Output from previous day: 08/01 0701 - 08/02 0700 In: 472.9 [I.V.:472.9] Out: 300 [Urine:300]  GENERAL: Appears very weak, alert HEENT: No thrush, mouth is dry LYMPH: Soft mobile left cervical and left axillary nodes, smaller on exam today LUNGS:  Improved aeration on the right HEART: Regular rate and rhythm ABDOMEN: Nontender, spleen tip palpable in the left subcostal region NEURO: Alert and oriented x3 Vascular: No leg edema  LABORATORY DATA:  I have reviewed the data as listed CMP Latest Ref Rng & Units 07/21/2020 07/19/2020 07/18/2020  Glucose 70 - 99 mg/dL 96 112(H) 114(H)  BUN 8 - 23 mg/dL '18 15 12  ' Creatinine 0.44 - 1.00 mg/dL 1.26(H) 0.76 0.76  Sodium 135 - 145 mmol/L 136 138 136  Potassium 3.5 - 5.1 mmol/L 3.5 3.8 4.5  Chloride 98 - 111 mmol/L 96(L) 96(L) 94(L)  CO2 22 - 32 mmol/L 29 33(H) 33(H)  Calcium 8.9 - 10.3 mg/dL 8.0(L) 8.2(L) 7.8(L)  Total Protein 6.5 - 8.1 g/dL 5.3(L) 5.4(L) 5.2(L)  Total Bilirubin 0.3 - 1.2 mg/dL 1.0 1.5(H) 1.3(H)  Alkaline Phos 38 - 126 U/L 141(H) 141(H) 147(H)  AST 15 - 41 U/L 44(H) 50(H) 53(H)  ALT 0 - 44 U/L '25 31 29    ' Lab Results  Component Value Date   WBC 6.4 07/21/2020   HGB 9.8 (L) 07/21/2020   HCT 29.3 (L) 07/21/2020   MCV 102.1 (H) 07/21/2020   PLT 165 07/21/2020   NEUTROABS 3.0 07/21/2020    DG Chest 1 View  Result Date: 07/08/2020 CLINICAL DATA:  86 year old female status post right thoracentesis. EXAM: CHEST  1 VIEW COMPARISON:  Chest radiograph dated 07/06/2020. FINDINGS: Right-sided Port-A-Cath in similar position. Bilateral patchy airspace opacities, right greater left with interval progression compared to prior radiograph may represent edema or pneumonia. Clinical correlation is recommended. Small bilateral pleural effusions. No pneumothorax. Stable cardiac silhouette. Atherosclerotic calcification of the aorta. No acute osseous pathology. IMPRESSION: 1. Small bilateral pleural effusions.  No pneumothorax. 2. Bilateral airspace opacities, progressed since the prior radiograph. Electronically Signed   By: Anner Crete M.D.   On: 07/08/2020 18:26   DG Chest 1 View  Result Date: 07/04/2020 CLINICAL DATA:  Status post right thoracentesis. EXAM: CHEST  1 VIEW  COMPARISON:  Chest x-ray from yesterday. FINDINGS: Unchanged right chest wall port catheter. The heart size and mediastinal contours are within normal limits. Normal pulmonary vascularity. Trace residual right pleural effusion status post thoracentesis. Improved aeration at the right lung base. No pneumothorax. Unchanged small left pleural effusion and basilar atelectasis. No acute osseous abnormality. IMPRESSION: 1. Trace residual right pleural effusion status post thoracentesis. No pneumothorax. 2. Unchanged small left pleural effusion and basilar atelectasis. Electronically Signed   By: Titus Dubin M.D.   On: 07/04/2020 11:42   DG Chest 2 View  Result Date: 07/14/2020 CLINICAL DATA:  Dyspnea. EXAM: CHEST - 2 VIEW COMPARISON:  07/09/2020 FINDINGS: Right chest wall port a catheter is noted with tip at the cavoatrial junction. Moderate right pleural effusion the with [place] opacification of the right lower lobe is again noted. When compared with the previous exam there is improved aeration to the right and left midlung zones and left lower lobe. IMPRESSION: 1. Improved aeration to the right and left lower lobes. 2. Persistent right pleural effusion. Electronically Signed   By: Kerby Moors M.D.   On: 07/14/2020 12:16   DG  Chest 2 View  Result Date: 07/09/2020 CLINICAL DATA:  Follow-up pleural effusion. EXAM: CHEST - 2 VIEW COMPARISON:  07/08/20 FINDINGS: Right chest wall port a catheter is noted with tip projecting over the cavoatrial junction. Interval increase in volume of right pleural effusion. Mild diffuse pulmonary edema. Bilateral airspace opacities are noted within the left upper lobe, right midlung and right base concerning for multifocal pneumonia. IMPRESSION: 1. Increase in volume of right pleural effusion with mild interstitial edema 2. Bilateral airspace opacities concerning for multifocal pneumonia. Electronically Signed   By: Kerby Moors M.D.   On: 07/09/2020 11:21   DG Chest 2  View  Result Date: 07/06/2020 CLINICAL DATA:  Worsening shortness of breath. EXAM: CHEST - 2 VIEW COMPARISON:  07/04/2020 FINDINGS: The cardiac silhouette remains grossly normal in size, spur partially obscured by significantly increased patchy opacity in the right lower lung zone. Small amount of posterior pleural fluid. Mild linear density at the left lung base. Right subclavian porta catheter tip in the region of the superior cavoatrial junction. Unremarkable bones. IMPRESSION: 1. Significantly increased right lower lung zone pneumonia. 2. Small amount of posterior pleural fluid. 3. Mild left basilar atelectasis. Electronically Signed   By: Claudie Revering M.D.   On: 07/06/2020 16:24   DG Chest 2 View  Result Date: 07/03/2020 CLINICAL DATA:  Dyspnea, pleural effusions EXAM: CHEST - 2 VIEW COMPARISON:  06/26/2020 FINDINGS: Frontal and lateral views of the chest demonstrates stable right chest wall port. Cardiac silhouette is unremarkable. There is now a moderate right pleural effusion with right basilar consolidation. Decreased left pleural effusion, with only trace fluid at the left costophrenic angle. Persistent retrocardiac consolidation. No pneumothorax. No acute bony abnormalities. IMPRESSION: 1. Decreased left pleural effusion, with persistent left basilar consolidation. 2. Increasing right pleural effusion, with increasing right basilar consolidation. Electronically Signed   By: Randa Ngo M.D.   On: 07/03/2020 16:11   DG Chest 2 View  Result Date: 06/26/2020 CLINICAL DATA:  Dyspnea. EXAM: CHEST - 2 VIEW COMPARISON:  Chest CT 05/22/2020 FINDINGS: Right chest port remains in place. Hazy opacity at the left lung base consistent with pleural effusion and airspace disease/atelectasis. Right lung is clear. Upper normal heart size. Mild right hilar prominence likely secondary to combination of vascular structures and adenopathy is seen on recent CT. Fullness in the upper mediastinum likely secondary to  adenopathy. No pneumothorax. No pulmonary edema. Surgical clips in the left axilla. IMPRESSION: 1. Hazy opacity at the left lung base consistent with pleural effusion and airspace disease/atelectasis. 2. Mild right hilar prominence likely secondary to combination of vascular structures and adenopathy seen on recent CT. Electronically Signed   By: Keith Rake M.D.   On: 06/26/2020 15:54   CT ANGIO CHEST PE W OR WO CONTRAST  Result Date: 07/07/2020 CLINICAL DATA:  Lymphoma, recurrent right pleural effusion bloody tap EXAM: CT ANGIOGRAPHY CHEST WITH CONTRAST TECHNIQUE: Multidetector CT imaging of the chest was performed using the standard protocol during bolus administration of intravenous contrast. Multiplanar CT image reconstructions and MIPs were obtained to evaluate the vascular anatomy. CONTRAST:  164m OMNIPAQUE IOHEXOL 350 MG/ML SOLN COMPARISON:  None. FINDINGS: Cardiovascular: Aortic atherosclerosis. Normal heart size. Central pulmonary arteries are patent. Mediastinum/Nodes: Adenopathy is identified in the supraclavicular region, bilateral axilla, and mediastinum. Nodes appear and measures slightly smaller in size. Lungs/Pleura: Moderate right and mild to moderate left pleural effusions measuring simple fluid in density. Imaging performed during expiration. Persistent atelectasis at the left lung base. New atelectasis/consolidation  at the right lung base. Interlobular septal thickening the non dependent right lung may reflect asymmetric edema. Upper Abdomen: Ascites. Unchanged ill-defined hypoattenuating lesion in the superior spleen. Musculoskeletal: No new abnormality. Review of the MIP images confirms the above findings. IMPRESSION: Moderate right and mild to moderate left pleural effusions. Right basilar atelectasis/consolidation and left basilar atelectasis. Interlobular septal thickening in the right lung may reflect asymmetric edema. Multifocal adenopathy appears stable to slightly improved.  Electronically Signed   By: Macy Mis M.D.   On: 07/07/2020 14:33   CT Abdomen Pelvis W Contrast  Result Date: 06/23/2020 CLINICAL DATA:  86 year old female with abdominal pain, nausea and vomiting. History of lymphoma. EXAM: CT ABDOMEN AND PELVIS WITH CONTRAST TECHNIQUE: Multidetector CT imaging of the abdomen and pelvis was performed using the standard protocol following bolus administration of intravenous contrast. CONTRAST:  107m OMNIPAQUE IOHEXOL 300 MG/ML  SOLN COMPARISON:  CT of the chest abdomen pelvis dated 05/22/2020. FINDINGS: Lower chest: Partially visualized moderate left and trace right pleural effusions, new since the prior CT. There is associated partial compressive atelectasis of the left lower lobe. Pneumonia is not excluded. Clinical correlation is recommended. No intra-abdominal free air. Interval development of a small ascites, new or significantly increased since the prior CT. Hepatobiliary: A 1 cm hypodense focus along the posterior liver capsule is not characterized but may represent a cyst or focal area of scarring. This is similar to prior CT. Subcentimeter hypodense focus in the anterior liver is too small to characterize. There is mild intrahepatic biliary ductal dilatation, likely post cholecystectomy. The common bile duct is dilated measuring 14 mm. No retained calcified stone noted in the central CBD. Pancreas: The pancreas is unremarkable as visualized. Spleen: Mildly enlarged spleen measuring 15 cm in craniocaudal length. Adrenals/Urinary Tract: The adrenal glands are poorly visualized but grossly unremarkable. There is mild fullness of the renal collecting systems bilaterally without frank hydronephrosis. Mild hazy appearance of the urothelium noted. Correlation with urinalysis recommended to exclude UTI. There is symmetric enhancement and excretion of contrast by both kidneys. Subcentimeter left renal hypodense focus is not characterized. The urinary bladder is partially  distended. Probable chronic bladder wall thickening and perivesical stranding. Correlation with urinalysis recommended to exclude UTI. Stomach/Bowel: There is sigmoid diverticulosis without definite active inflammatory changes. There is postsurgical changes of bowel with anastomotic suture in the right lower quadrant. No definite evidence of bowel obstruction. Evaluation however is limited in the absence of oral contrast. Vascular/Lymphatic: There is moderate aortoiliac atherosclerotic disease. The IVC is unremarkable as visualized. The SMV, splenic vein, and main portal vein are patent. No portal venous gas. Bulky retroperitoneal adenopathy encasing the abdominal aorta and IVC as well as enlarged bilateral iliac chain lymph nodes in keeping with history of lymphoma. Overall the degree of adenopathy is relatively similar to prior CT. Reproductive: The uterus is poorly visualized. Other: Diffuse mesenteric edema, new since the prior CT. There is scattered mesenteric adenopathy as seen previously. Musculoskeletal: Degenerative changes of the spine. Minimal compression fracture of the anterior superior and inferior endplates of the L3, new since the prior CT, likely acute or subacute. IMPRESSION: 1. Interval development of partially visualized moderate left and trace right pleural effusions, as well as development of diffuse mesenteric edema and ascites, new since the prior CT. 2. Bulky retroperitoneal and iliac chain adenopathy in keeping with history of lymphoma. Overall the degree of adenopathy is relatively similar to prior CT. 3. Mild splenomegaly. 4. Minimal compression fracture of the superior and  inferior endplate of L3, likely acute or subacute. 5. Sigmoid diverticulosis. 6. Aortic Atherosclerosis (ICD10-I70.0). Electronically Signed   By: Anner Crete M.D.   On: 06/23/2020 16:39   DG CHEST PORT 1 VIEW  Result Date: 07/17/2020 CLINICAL DATA:  Status post thoracentesis. EXAM: PORTABLE CHEST 1 VIEW  COMPARISON:  Radiograph yesterday. FINDINGS: Decreased size of right pleural effusion post thoracentesis. Small residual at the right lung base. No visualized pneumothorax. Right chest port remains in place. Heart is normal in size. Left lung is clear without focal abnormality. No significant left pleural effusion. IMPRESSION: Decreased size of right pleural effusion post thoracentesis, small residual at the right lung base. No visualized pneumothorax. Electronically Signed   By: Keith Rake M.D.   On: 07/17/2020 17:36   DG CHEST PORT 1 VIEW  Result Date: 07/16/2020 CLINICAL DATA:  86 year old female with history of shortness of breath. Pleural effusion. EXAM: PORTABLE CHEST 1 VIEW COMPARISON:  Chest x-ray 07/14/2020. FINDINGS: Right subclavian single-lumen porta cath with tip terminating at the superior cavoatrial junction. Enlarging large right pleural effusion with extensive passive atelectasis in the right mid to lower lung. Left lung is clear. No left pleural effusion. No pneumothorax. No evidence of pulmonary edema. Heart size appears normal. Aortic atherosclerosis. Surgical clips project over the left axillary region, presumably from prior left axillary nodal dissection. IMPRESSION: 1. Enlarging right pleural effusion with worsening passive atelectasis in the right lung. 2. Aortic atherosclerosis. Electronically Signed   By: Vinnie Langton M.D.   On: 07/16/2020 10:17   ECHOCARDIOGRAM COMPLETE  Result Date: 07/07/2020    ECHOCARDIOGRAM REPORT   Patient Name:   Tracy Mclaughlin Date of Exam: 07/07/2020 Medical Rec #:  [id number]            Height:       68.0 in Accession #:    [id number]           Weight:       174.0 lb Date of Birth:  1934-09-29           BSA:          1.926 m Patient Age:    86 years             BP:           132/65 mmHg Patient Gender: F                    HR:           855 bpm. Exam Location:  Inpatient Procedure: 2D Echo Indications:    Atrial tachycardia [facility]) [[id number]]   History:        Patient has prior history of Echocardiogram examinations, most                 recent 05/31/2019. Risk Factors:Hypertension. Lymphoma , thyroid                 disease, thyroid cancer. Past history of afib.  Sonographer:    Darlina Sicilian RDCS Referring Phys: [place]  1. Left ventricular ejection fraction, by estimation, is 60 to 65%. The left ventricle has normal function. The left ventricle has no regional wall motion abnormalities. Left ventricular diastolic parameters were normal.  2. Right ventricular systolic function is normal. The right ventricular size is normal.  3. Moderate pleural effusion in both left and right lateral regions.  4. The mitral valve is normal in structure. Trivial mitral valve regurgitation. No evidence  of mitral stenosis.  5. The aortic valve is tricuspid. Aortic valve regurgitation is not visualized. No aortic stenosis is present.  6. The inferior vena cava is normal in size with <50% respiratory variability, suggesting right atrial pressure of 8 mmHg. Conclusion(s)/Recommendation(s): Normal biventricular function without evidence of hemodynamically significant valvular heart disease. FINDINGS  Left Ventricle: Left ventricular ejection fraction, by estimation, is 60 to 65%. The left ventricle has normal function. The left ventricle has no regional wall motion abnormalities. The left ventricular internal cavity size was normal in size. There is  no left ventricular hypertrophy. Left ventricular diastolic parameters were normal. Right Ventricle: The right ventricular size is normal. No increase in right ventricular wall thickness. Right ventricular systolic function is normal. Left Atrium: Left atrial size was normal in size. Right Atrium: Right atrial size was normal in size. Pericardium: Trivial pericardial effusion is present. Mitral Valve: The mitral valve is normal in structure. Trivial mitral valve regurgitation. No evidence of mitral valve  stenosis. Tricuspid Valve: The tricuspid valve is normal in structure. Tricuspid valve regurgitation is mild . No evidence of tricuspid stenosis. Aortic Valve: The aortic valve is tricuspid. Aortic valve regurgitation is not visualized. No aortic stenosis is present. Pulmonic Valve: The pulmonic valve was grossly normal. Pulmonic valve regurgitation is trivial. Aorta: The aortic root, ascending aorta and aortic arch are all structurally normal, with no evidence of dilitation or obstruction. Venous: The inferior vena cava is normal in size with less than 50% respiratory variability, suggesting right atrial pressure of 8 mmHg. IAS/Shunts: No atrial level shunt detected by color flow Doppler. Additional Comments: There is a moderate pleural effusion in both left and right lateral regions.  LEFT VENTRICLE PLAX 2D LVIDd:         5.10 cm  Diastology LVIDs:         3.30 cm  LV e' lateral:   8.27 cm/s LV PW:         0.90 cm  LV E/e' lateral: 9.2 LV IVS:        0.70 cm  LV e' medial:    6.64 cm/s LVOT diam:     1.80 cm  LV E/e' medial:  11.5 LV SV:         30 LV SV Index:   15 LVOT Area:     2.54 cm  RIGHT VENTRICLE RV S prime:     14.80 cm/s TAPSE (M-mode): 2.4 cm LEFT ATRIUM           Index       RIGHT ATRIUM           Index LA diam:      3.00 cm 1.56 cm/m  RA Area:     13.80 cm LA Vol (A2C): 44.7 ml 23.21 ml/m RA Volume:   33.50 ml  17.39 ml/m LA Vol (A4C): 42.6 ml 22.12 ml/m  AORTIC VALVE LVOT Vmax:   77.70 cm/s LVOT Vmean:  49.700 cm/s LVOT VTI:    0.116 m  AORTA Ao Root diam: 3.00 cm MITRAL VALVE MV Area (PHT): 4.96 cm    SHUNTS MV Decel Time: 153 msec    Systemic VTI:  0.12 m MV E velocity: 76.30 cm/s  Systemic Diam: 1.80 cm MV A velocity: 78.80 cm/s MV E/A ratio:  0.97 Buford Dresser MD Electronically signed by Buford Dresser MD Signature Date/Time: 07/07/2020/5:02:22 PM    Final    [place] ASCITES (ABDOMEN LIMITED)  Result Date: 07/01/2020 CLINICAL DATA:  History of lymphoma, now  with concern for  symptomatic intra-abdominal ascites. Please perform ascites search ultrasound and ultrasound-guided paracentesis as indicated. EXAM: LIMITED ABDOMEN ULTRASOUND FOR ASCITES TECHNIQUE: Limited ultrasound survey for ascites was performed in all four abdominal quadrants. COMPARISON:  CT abdomen pelvis-06/23/2020 FINDINGS: Sonographic evaluation demonstrates a trace amount of intra-abdominal ascites, too small to allow for safe ultrasound-guided paracentesis. Note is made of small bilateral pleural effusions, the right side of which appears new compared to abdominal CT performed 06/23/2020. IMPRESSION: 1. Trace amount of intra-abdominal ascites, too small to allow for safe ultrasound-guided paracentesis. No paracentesis attempted. 2. Small bilateral pleural effusions - the left-sided pleural effusion appears similar to abdominal CT performed 06/23/2020 however the right-sided pleural effusion appears new of the CT. Electronically Signed   By: Sandi Mariscal M.D.   On: 07/01/2020 15:11   US Abdomen Limited RUQ  Result Date: 07/02/2020 CLINICAL DATA:  Abnormal LFTs EXAM: ULTRASOUND ABDOMEN LIMITED RIGHT UPPER QUADRANT COMPARISON:  CT 06/23/2020.  Ultrasound 07/01/2020 FINDINGS: Gallbladder: Prior cholecystectomy Common bile duct: Diameter: Prominent measuring up to 10 mm, likely related to post cholecystectomy state. Liver: Heterogeneous, slightly increased echotexture. Subtle nodular contours to the liver surface. Findings raise the possibility of cirrhosis. No suspicious focal hepatic abnormality. Portal vein is patent on color Doppler imaging with normal direction of blood flow towards the liver. Other: Ascites and right effusion noted. Prominent porta hepatis lymph nodes. IMPRESSION: Heterogeneous, increased echotexture throughout the liver. Subtle nodular contour seen in some areas of the liver. Findings suggest the possibility of cirrhosis. Perihepatic ascites and right pleural effusion. Mild biliary ductal  dilatation, likely related to post cholecystectomy state. Prominent porta hepatis lymph nodes, likely related to liver disease. Electronically Signed   By: Rolm Baptise M.D.   On: 07/02/2020 08:52   US THORACENTESIS ASP PLEURAL SPACE W/IMG GUIDE  Result Date: 07/08/2020 INDICATION: Lymphoma, remote colon and thyroid cancers, dyspnea, right pleural effusion.request for diagnostic and therapeutic thoracentesis. EXAM: ULTRASOUND GUIDED RIGHT THORACENTESIS MEDICATIONS: 1% lidocaine 10 mL COMPLICATIONS: None immediate. PROCEDURE: An ultrasound guided thoracentesis was thoroughly discussed with the patient and questions answered. The benefits, risks, alternatives and complications were also discussed. The patient understands and wishes to proceed with the procedure. Written consent was obtained. Ultrasound was performed to localize and mark an adequate pocket of fluid in the right chest. The area was then prepped and draped in the normal sterile fashion. 1% Lidocaine was used for local anesthesia. Under ultrasound guidance a 6 Fr Safe-T-Centesis catheter was introduced. Thoracentesis was performed. The catheter was removed and a dressing applied. FINDINGS: A total of approximately 500 mL of red fluid was removed. Samples were sent to the laboratory as requested by the clinical team. IMPRESSION: Successful ultrasound guided right thoracentesis yielding 500 mL of pleural fluid. Read by: Gareth Eagle, PA-C Electronically Signed   By: Jerilynn Mages.  Shick M.D.   On: 07/08/2020 16:31   US THORACENTESIS ASP PLEURAL SPACE W/IMG GUIDE  Result Date: 07/04/2020 INDICATION: Patient with history of lymphoma, remote colon and thyroid cancers, dyspnea, right pleural effusion. Request made for diagnostic and therapeutic right thoracentesis. EXAM: ULTRASOUND GUIDED DIAGNOSTIC AND THERAPEUTIC RIGHT THORACENTESIS MEDICATIONS: 1% lidocaine to skin and subcutaneous tissue COMPLICATIONS: None immediate. PROCEDURE: An ultrasound guided  thoracentesis was thoroughly discussed with the patient and questions answered. The benefits, risks, alternatives and complications were also discussed. The patient understands and wishes to proceed with the procedure. Written consent was obtained. Ultrasound was performed to localize and mark an adequate pocket of fluid in the  right chest. The area was then prepped and draped in the normal sterile fashion. 1% Lidocaine was used for local anesthesia. Under ultrasound guidance a 6 Fr Safe-T-Centesis catheter was introduced. Thoracentesis was performed. The catheter was removed and a dressing applied. FINDINGS: A total of approximately 1.6 liters of blood-tinged fluid was removed. Samples were sent to the laboratory as requested by the clinical team. IMPRESSION: Successful ultrasound guided diagnostic and therapeutic right thoracentesis yielding 1.6 liters of pleural fluid. Read by: Rowe Robert, PA-C Electronically Signed   By: Lucrezia Europe M.D.   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 Status post weekly Rituxan x4 03/01/2012 through 03/22/2012. She completed 4 "maintenance" doses of Rituxan, last on 12/19/2012. A restaging CT on 02/09/2013 showed no evidence of lymphoma.   Lymph node lateral to the thyroid bed on a neck ultrasound 02/21/2014, status post an FNA biopsy concerning for a lymphoproliferative disorder.  PET scan 09/28/2016 with active lymphoma within the neck, chest, abdomen, pelvis; splenic enlargement and hypermetabolism suspicious for splenic involvement.  Initiation of Rituxan weekly 4 09/29/2016  Initiation of maintenance Rituxan on a 3 month schedule 12/23/2016; final Rituxan 08/31/2018  Thyroid ultrasound 02/07/2019-left cervical lymphadenopathy  PET scan 03/08/2019-extensive recurrent hypermetabolic lymphoma involving the neck,  chest, abdomen and pelvis.  03/16/2019 left cervical lymph node biopsy-features consistent with previously diagnosed non-Hodgkin's B-cell lymphoma, phenotypically consistent with marginal zone lymphoma. Flow cytometry with lambda restricted B-cell population without expression of CD5 or CD10 comprising 87% of all lymphocytes.  Cycle 1 bendamustine/Rituxan 03/22/2019  Excision deep left axillary lymph nodes 05/04/2019-non-Hodgkin's B-cell lymphoma with differential including a marginal zone lymphoma and atypical small lymphocytic lymphoma. Flow cytometry with monoclonal B-cell population without expression of CD5 or CD10, comprises 96% of all lymphocytes.  Cycle 1 [facility]/Rituxan 05/18/2019  Cycle 2 [facility]/Rituxan 06/08/2019  CTs 06/15/2019-partial improvement in diffuse adenopathy, stable mild splenomegaly  Cycle 1 Revlimid/rituximab 07/19/2019 (Revlimid start 07/20/2019)  Cycle 2 Revlimid/rituximab 08/16/2019 (Revlimid placed on hold 08/30/2019 due to neutropenia)  Cycle 3 of Revlimid/rituximab 09/13/2019 (Revlimid schedule changed to 14 days on/14 days off)  Cycle 4 Revlimid/rituximab 10/10/2019  Cycle 5 Revlimid/rituximab 11/07/2019  Cycle 6 Revlimid/rituximab 12/07/2019  CTs 12/28/2019-diffuse lymphadenopathy-slightly increased compared to 06/15/2019  Cycle 7 Revlimid/rituximab 01/04/2020  Cycle 8 Revlimid/Rituxan 02/01/2020  Cycle 9 Revlimid/Rituxan 03/05/2020  Cycle 10 Revlimid/Rituxan 04/02/2020  Cycle 11 Revlimid/Rituxan 04/30/2020  CT 05/22/2020-mild increase in left supraclavicular adenopathy, mild increase in chest, retroperitoneal, and pelvic adenopathy. Stable mild splenomegaly.  Acalabrutinib started 06/19/2020  Acalabrutinib discontinued on hospital admission 06/23/2020 secondary to nausea and diarrhea  acalabrutinib resumed 06/26/2020  CT abdomen/pelvis 06/23/2020-moderate left and trace right pleural effusions, new diffuse mesenteric edema and ascites, stable retroperitoneal and  iliac adenopathy, mild splenomegaly  Acalabrutinib discontinued 07/02/2020 secondary to altered mental status  Acalabrutinib resumed 07/17/2020  2. Stage IV (T1bN1b M0) papillary thyroid cancer, status post a thyroidectomy with reimplantation of the left superior parathyroid gland on 05/23/2012, status post radioactive iodine therapy, followed by Dr. Buddy Duty.  3. Stage II (T3 N0) colon cancer, status post a right colectomy 10/19/2011, last colonoscopy April 2015-sigmoid adenoma removed.  4. History of a pulmonary embolism December 2012.  5. History of Atrial fibrillation 6. Iron deficiency anemia-new 03/18/2014. Hemoccult positive stool. The anemia corrected with iron. No longer taking iron.  Status post an upper endoscopy and colonoscopy by Dr. Carlean Purl April 2015 with no  bleeding source identified, benign adenoma removed from the sigmoid colon. 7. Report of an upper gastric intestinal bleed fall 2016-managed in [place]. [name] 8. Left knee replacement May 2017, repeat left knee surgery May 2018 9. Pruritic rash 07/22/2016 10. Nausea and diarrhea 04/02/2019-stool negative for C. difficile toxin 11. 06/18/2019 [facility] admission for symptomatic anemia. 12.  Admission 06/23/2020 with nausea and diarrhea 13.  Respiratory failure, bilateral pleural effusions  Right thoracentesis 07/04/2020-culture and cytology negative  CT chest 07/07/2020-right and left pleural effusions, bilateral basilar atelectasis, interlobular septal thickening in the right lung-asymmetric edema?  Improved adenopathy  Right thoracentesis 07/08/2020-flow cytometry with a small clonal B-cell population  Right thoracentesis 07/17/2020-flow cytometry and cytology pending  Tracy Mclaughlin appears improved.  She remains off oxygen.  According to note from thoracentesis, pleural fluid was sent for flow and cytology, but I do not see these pending.  She was restarted on acalabrutinib 07/17/2020.  Tolerating it well.  AST mildly elevated, but  stable.  T bili has normalized.  LDH has been requested and is pending.  The patient appears to be stable for discharge to SNF.  Recommend for nursing to continue to mobilize patient.  The patient and her husband prefer SNF in the [facility] area.  Recommendations: 1.  Continue acalabrutinib 100 mg daily.   2.  Monitor O2 sats closely on room air. 3.  Follow liver enzymes while on acalabrutinib 4.  Okay from our standpoint to discharge to SNF when bed available.  We will arrange for outpatient follow-up with cancer center.  Recommend a CBC and CMP in 1 week at the skilled nursing facility, follow-up at the Cancer center week of 08/04/2020.   LOS: 27 days   Mikey Bussing 07/21/[name] was interviewed and examined.  She is improved from a respiratory standpoint.  She continues to have anorexia and malaise.  The palpable lymph nodes appear slightly smaller at the left neck today.  The liver enzymes are normal and she is otherwise tolerating the acalabrutinib well.  Cytology and flow cytometry from the 07/17/2020 pleural fluid are pending.  We will arrange for outpatient follow-up at the Cancer   I updated her husband by telephone today.  He agrees with skilled nursing facility placement and outpatient follow-up at the Cancer center.

## 2020-07-21 NOTE — Progress Notes (Signed)
Nutrition Follow-up  DOCUMENTATION CODES:   Not applicable  INTERVENTION:  - will d/c Dillard Essex and Prosource Plus. - will order Magic Cup BID, each supplement provides 290 kcal and 9 grams protein. - continue to encourage intakes and allow for outside food to be brought in. - recommend trial appetite stimulant--contacted Dr. Dwyane Dee about recommendation.  NUTRITION DIAGNOSIS:   Increased nutrient needs related to acute illness, cancer and cancer related treatments, chronic illness as evidenced by estimated needs -ongoing  GOAL:   Patient will meet greater than or equal to 90% of their needs -unmet  MONITOR:   PO intake, Supplement acceptance, Labs, Weight trends  ASSESSMENT:   86 y.o. female with medical history of low-grade lymphoma without significant response to chemotherapy last year and was recently started on BTK inhibitor, hypothyroidism, and A. fib. She presented to the ED with 3-4 day history of N/V/D and abdominal pain (RUQ). Symptoms started after new chemo initiation. UA on admission was positive for UTI. CT abdomen in the ED showed free fluid, ascites, and mesenteric edema. She was re-started on chemo during this admission but continues to have nausea and anorexia which is thought to be 2/2 lymphoma and treatment; started on prednisone to aid with appetite.  She has accepted 1 of 6 bottles of Dillard Essex and 1 of 6 packets of ProSource Plus since previous assessment on 7/27. She has been eating 0-30% of meals. Weight has been fluctuating over the past 1 week; lasix order was started on 7/22. Very high risk for malnutrition.   Per notes: - ongoing recommendation for SNF but patient refusing  - declining [place] reviewed; Cl: 96 mmol/l, creatinine: 1.26 mg/dl, Ca: 8 mg/dl, Alk Phos elevated, GFR: 39 ml/min. Medications reviewed; 20 mg IV lasix BID, 224 mcg oral synthroid/day, 3 mg melatonin/day, 1 tablet multivitamin with minerals, 40 mg oral  protonix/day.    Diet Order:   Diet Order            Diet regular Room service appropriate? Yes; Fluid consistency: Thin  Diet effective now                 EDUCATION NEEDS:   No education needs have been identified at this time  Skin:  Skin Assessment: Reviewed RN Assessment  Last BM:  7/31  Height:   Ht Readings from Last 1 Encounters:  06/23/20 5' 8" (1.727 m)    Weight:   Wt Readings from Last 1 Encounters:  07/21/20 66.1 kg     Estimated Nutritional Needs:  Kcal:  1700-1900 kcal Protein:  75-85 grams Fluid:  >/= 2 L/day     Jarome Matin, MS, RD, LDN, CNSC Inpatient Clinical Dietitian RD pager # available in [facility]  After hours/weekend pager # available in [facility]

## 2020-07-21 NOTE — Progress Notes (Signed)
Physical Therapy Treatment Patient Details Name: Tracy Mclaughlin MRN: [id number] DOB: [birth year] Today's Date: 07/21/2020    History of Present Illness 86 yo female admitted with N/V/D, abd pain, UTI, ascites.  Pt diagnosed with Acute hypoxic respiratory failure due to pleural effusion. Hx of NHL, hypothyroidism, A fib, anemia, OA, PE, SAH.    PT Comments    Pt just up to recliner with nurse tech and required brief rest break.  Pt agreeable to ambulate and then wished to return to bed (does not prefer recliner - too hard).  Pt required seated rest break during ambulation stating "weak legs". Continue to recommend SNF.  Follow Up Recommendations  SNF;Supervision/Assistance - 24 hour     Equipment Recommendations  Wheelchair (measurements PT)    Recommendations for Other Services       Precautions / Restrictions Precautions Precautions: Fall    Mobility  Bed Mobility Overal bed mobility: Needs Assistance Bed Mobility: Sit to Supine     Supine to sit: Min assist     General bed mobility comments: assist for LEs onto bed  Transfers Overall transfer level: Needs assistance Equipment used: Rolling walker (2 wheeled) Transfers: Sit to/from Stand Sit to Stand: Min guard         General transfer comment: pt requesting to perform on her own, requires UE assist  Ambulation/Gait Ambulation/Gait assistance: Min assist Gait Distance (Feet): 20 Feet (x2) Assistive device: Rolling walker (2 wheeled) Gait Pattern/deviations: Decreased stride length;Step-through pattern;Trunk flexed Gait velocity: decr   General Gait Details: pt reports weakness limiting, required seated rest break after 20 feet and then ambulated back room   Stairs             Wheelchair Mobility    Modified Rankin (Stroke Patients Only)       Balance                                            Cognition Arousal/Alertness: Awake/alert Behavior During Therapy: WFL for  tasks assessed/performed Overall Cognitive Status: Within Functional Limits for tasks assessed                                        Exercises      General Comments        Pertinent Vitals/Pain Pain Assessment: No/denies pain    Home Living                      Prior Function            PT Goals (current goals can now be found in the care plan section) Acute Rehab PT Goals PT Goal Formulation: With patient Time For Goal Achievement: 08/04/20 Potential to Achieve Goals: Good Progress towards PT goals: Progressing toward goals    Frequency    Min 3X/week      PT Plan Current plan remains appropriate    Co-evaluation              AM-PAC PT "6 Clicks" Mobility   Outcome Measure  Help needed turning from your back to your side while in a flat bed without using bedrails?: A Little Help needed moving from lying on your back to sitting on the side of a flat bed without using bedrails?: A Little  Help needed moving to and from a bed to a chair (including a wheelchair)?: A Little Help needed standing up from a chair using your arms (e.g., wheelchair or bedside chair)?: A Little Help needed to walk in hospital room?: A Lot Help needed climbing 3-5 steps with a railing? : A Lot 6 Click Score: 16    End of Session Equipment Utilized During Treatment: Gait belt Activity Tolerance: Patient limited by fatigue Patient left: in bed;with call bell/phone within reach Nurse Communication: Mobility status PT Visit Diagnosis: Muscle weakness (generalized) (M62.81);Difficulty in walking, not elsewhere classified (R26.2);Unsteadiness on feet (R26.81)     Time: 8937-3428 PT Time Calculation (min) (ACUTE ONLY): 20 min  Charges:  $Gait Training: 8-22 mins                    Arlyce Dice, DPT Acute Rehabilitation Services Pager: [phone] Office: [phone]  York Ram E 07/21/2020, 1:02 PM

## 2020-07-21 NOTE — Progress Notes (Signed)
   Name: Tracy Mclaughlin MRN: [id number] DOB: [birth year]    ADMISSION DATE:  06/23/2020 CONSULTATION DATE:  07/09/2020  REFERRING MD :  Dr. Barrett Henle  CHIEF COMPLAINT:  Recurrent Pleural Effusion   BRIEF PATIENT DESCRIPTION:  86 yo female, currently being treated for Lymphoma. Now with Bilateral Recurrent Pleural Effusions   HISTORY OF PRESENT ILLNESS:   Admitted 7/5 with Nausea/Vomiting, Diarrhea. Has Lymphoma without significant response to chemotherapy, recently started on BTK Inhibitor. CTA with free fluid, ascites and mesenteric edema. Treated for possible UTI. Stay complicated with bilateral recurrent pleural effusions. Underwent Thoracentesis on 7/16 for Right Side and 7/20 on Left Side. Gram Stain negative, culture with no growth, cytology with reactive mesothelial cells. On 7/19 patient with narrow complex tachycardia, cardiology consulted and added scheduled lopressor in addition to Cardizem. Pulmonary Consult 7/21.   SIGNIFICANT EVENTS  Right Thora 7/16 > 1.6L Out, Bloody  Left Thora 7/20 > 500 ml Out, Bloody  STUDIES:  CXR 7/15 >  Decreased left pleural effusion, with persistent left basilar consolidation. Increasing right pleural effusion, with increasing right basilar. Consolidation. CTA Chest 7/19 > Moderate right and mild to moderate left pleural effusions. Right basilar atelectasis/consolidation and left basilar atelectasis. Interlobular septal thickening in the right lung may reflect asymmetric edema. Multifocal adenopathy appears stable to slightly improved. CXR 7/21 >  Increase in volume of right pleural effusion with mild interstitial edema.  Bilateral airspace opacities concerning for multifocal pneumonia.   SUBJECTIVE:  Feels better no distress   VITAL SIGNS: Temp:  [98.2 F (36.8 C)-98.5 F (36.9 C)] 98.4 F (36.9 C) (08/02 1306) Pulse Rate:  [65-84] 65 (08/02 1306) Resp:  [15-19] 19 (08/02 0525) BP: (101-123)/(41-55) 101/52 (08/02 1306) SpO2:  [89  %-93 %] 91 % (08/02 1306) Weight:  [66.1 kg] 66.1 kg (08/02 0500)  PHYSICAL EXAMINATION:  General frail 86 year old white female resting in bed  HENT NCAT  Pulm room air. Clear dec bases no accessory use Card RRR  abd not tender Neuro intact   Recent Labs  Lab 07/18/20 0325 07/19/20 0354 07/21/20 0915  NA 136 138 136  K 4.5 3.8 3.5  CL 94* 96* 96*  CO2 33* 33* 29  BUN 12 15 18   CREATININE 0.76 0.76 1.26*  GLUCOSE 114* 112* 96   Recent Labs  Lab 07/18/20 0325 07/19/20 0354 07/21/20 0915  HGB 10.4* 10.3* 9.8*  HCT 32.3* 32.3* 29.3*  WBC 6.2 7.1 6.4  PLT 148* 168 165   No results found.  ASSESSMENT / PLAN:  Acute Hypoxic Respiratory Failure in setting of recurrent bilateral pleural effusions, concern for malignancy vs acute heart failure/volume overload, vs infectious process  -Pleural fluid from 7/21 thoracentesis with reactive mesothelial cells only. Exudative effusion. Staph epidermis in pleural fluid likely contaminant. - Persistent R effusion on CXR 7/26. 7/20 Pleural cytology with atypical cells, approx 9% monoclonal B cell population detected.  Plan Ok to dc  F/u w/ onc If effusion re-occurs from our stand-point would probably benefit from pleur-x but she does not want this ->can f/u w/ ONC if she changes her mind feel free to refer her to our office   Erick Colace ACNP-BC [place] Pager # [phone] OR # [phone] if no answer

## 2020-07-22 ENCOUNTER — Telehealth: Payer: Self-pay | Admitting: Nurse Practitioner

## 2020-07-22 LAB — COMPREHENSIVE METABOLIC PANEL
ALT: 24 U/L (ref 0–44)
AST: 42 U/L — ABNORMAL HIGH (ref 15–41)
Albumin: 2.7 g/dL — ABNORMAL LOW (ref 3.5–5.0)
Alkaline Phosphatase: 132 U/L — ABNORMAL HIGH (ref 38–126)
Anion gap: 11 (ref 5–15)
BUN: 20 mg/dL (ref 8–23)
CO2: 29 mmol/L (ref 22–32)
Calcium: 8 mg/dL — ABNORMAL LOW (ref 8.9–10.3)
Chloride: 98 mmol/L (ref 98–111)
Creatinine, Ser: 1.48 mg/dL — ABNORMAL HIGH (ref 0.44–1.00)
GFR calc Af Amer: 38 mL/min — ABNORMAL LOW (ref >60)
GFR calc non Af Amer: 32 mL/min — ABNORMAL LOW (ref >60)
Glucose, Bld: 91 mg/dL (ref 70–99)
Potassium: 3.4 mmol/L — ABNORMAL LOW (ref 3.5–5.1)
Sodium: 138 mmol/L (ref 135–145)
Total Bilirubin: 0.8 mg/dL (ref 0.3–1.2)
Total Protein: 5.2 g/dL — ABNORMAL LOW (ref 6.5–8.1)

## 2020-07-22 LAB — CBC
HCT: 30.3 % — ABNORMAL LOW (ref 36.0–46.0)
Hemoglobin: 9.9 g/dL — ABNORMAL LOW (ref 12.0–15.0)
MCH: 33.6 pg (ref 26.0–34.0)
MCHC: 32.7 g/dL (ref 30.0–36.0)
MCV: 102.7 fL — ABNORMAL HIGH (ref 80.0–100.0)
Platelets: 206 10*3/uL (ref 150–400)
RBC: 2.95 MIL/uL — ABNORMAL LOW (ref 3.87–5.11)
RDW: 15.2 % (ref 11.5–15.5)
WBC: 9.6 10*3/uL (ref 4.0–10.5)
nRBC: 0 % (ref 0.0–0.2)

## 2020-07-22 LAB — MAGNESIUM: Magnesium: 2.2 mg/dL (ref 1.7–2.4)

## 2020-07-22 LAB — PHOSPHORUS: Phosphorus: 4.3 mg/dL (ref 2.5–4.6)

## 2020-07-22 NOTE — Hospital Course (Addendum)
[name] is an 86 y.o. 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 [name], hypothyroidism, history of A. fib, pancytopenia presents to the ED with 3-4 day history of nausea vomiting and diarrhea and abdominal pain-and right upper quadrant pain. symptoms started after new chemo on Thursday.Patient denies any fever,  chest pain. vomitted x 3 today and had non bloody diarrhea 4 times today. She was hemodynamically stable, afebrile, UA positive for UTI, CT scan abdomen done that showed free fluid with ascites and mesenteric edema, case was discussed Dr. [name] from oncology,  advised UTI treatment with Zosyn . Patient  was admitted, being managed symptomatically, chemo was held,  diarrhea improved subsequently,  oncology put the patient back on chemo overall tolerating well. Having nausea anorexia suspecting this is related to her lymphoma. Patient has been started on prednisone to help with appetite and also received trazodone for sleep. Patient remains deconditioned.   Patient was also noticed to have shortness of breath/dyspnea with activity-imaging studies showed pleural effusion underwent right-sided thoracentesis x 2  with 1.6 L of blood-tinged fluid removal, cytology reactive mesothelial celss, no growth on culture. Patient went into rapid tachycardia supraventricular 7/19 a.m. and also with hypoxia-underwent CTA chest : No PE but right-sided pleural effusion atelectasis/consolidation-cardiologist consulted.  Patient converted to sinus rhythm spontaneously. received few days of IV antibiotics , Procalcitonin has been negative/normal- no fever no leukocytosis. Patient continues to remain hypoxic and in respiratory failure, chest x-ray shows reaccumulation of the right pleural effusion.  Pulmonology consulted for repeat thoracocentesis or potentially Pleurx catheter. She underwent  thoracocentesis 1400 cc of fluid removed sent for cytology flow cytometry. Oncology resumed acalabrutinib plan if she does not improve would recommend hospice.   After multiple conversations patient finally agreed for skilled nursing facility awaiting insurance authorization.  At discharge, her diltiazem was held due to soft/low blood pressures.  If she does develop elevated blood pressures at discharge, could consider reinitiation.  In setting of her A. fib, she was rate controlled while Cardizem was withheld during hospitalization.

## 2020-07-22 NOTE — Progress Notes (Addendum)
HEMATOLOGY-ONCOLOGY PROGRESS NOTE  SUBJECTIVE: More awake and alert this morning.  Denies abdominal pain, nausea, vomiting.  States that she does not feel short of breath.  Oncology History  Non Hodgkin's lymphoma ([place])  09/29/2016 Initial Diagnosis   Non Hodgkin's lymphoma ([place])   03/22/2019 - 04/23/2019 Chemotherapy   The patient had palonosetron (ALOXI) injection 0.25 mg, 0.25 mg, Intravenous,  Once, 1 of 4 cycles Administration: 0.25 mg (03/22/2019) riTUXimab (RITUXAN) 800 mg in sodium chloride 0.9 % 250 mL (2.4242 mg/mL) infusion, 375 mg/m2 = 800 mg, Intravenous,  Once, 1 of 4 cycles Administration: 800 mg (03/22/2019) bendamustine (BENDEKA) 150 mg in sodium chloride 0.9 % 50 mL (2.6786 mg/mL) chemo infusion, 70 mg/m2 = 150 mg (100 % of original dose 70 mg/m2), Intravenous,  Once, 1 of 4 cycles Dose modification: 70 mg/m2 (original dose 70 mg/m2, Cycle 1, Reason: Provider Judgment) Administration: 150 mg (03/22/2019), 150 mg (03/23/2019)  for chemotherapy treatment.    05/18/2019 - 06/28/2019 Chemotherapy   The patient had DOXOrubicin (ADRIAMYCIN) chemo injection 82 mg, 40 mg/m2 = 82 mg (100 % of original dose 40 mg/m2), Intravenous,  Once, 2 of 6 cycles Dose modification: 40 mg/m2 (original dose 40 mg/m2, Cycle 1, Reason: Provider Judgment) Administration: 82 mg (05/18/2019), 82 mg (06/08/2019) palonosetron (ALOXI) injection 0.25 mg, 0.25 mg, Intravenous,  Once, 2 of 6 cycles Administration: 0.25 mg (05/18/2019), 0.25 mg (06/08/2019) pegfilgrastim (NEULASTA ONPRO KIT) injection 6 mg, 6 mg, Subcutaneous, Once, 2 of 6 cycles Administration: 6 mg (05/18/2019), 6 mg (06/08/2019) vinCRIStine (ONCOVIN) 1 mg in sodium chloride 0.9 % 50 mL chemo infusion, 1 mg (100 % of original dose 1 mg), Intravenous,  Once, 2 of 6 cycles Dose modification: 1 mg (original dose 1 mg, Cycle 1, Reason: Provider Judgment) Administration: 1 mg (05/18/2019), 1 mg (06/08/2019) riTUXimab (RITUXAN) 800 mg in sodium chloride 0.9 % 250  mL (2.4242 mg/mL) infusion, 375 mg/m2 = 800 mg, Intravenous,  Once, 2 of 6 cycles Administration: 800 mg (05/18/2019), 800 mg (06/08/2019) cyclophosphamide (CYTOXAN) 1,240 mg in sodium chloride 0.9 % 250 mL chemo infusion, 600 mg/m2 = 1,540 mg, Intravenous,  Once, 2 of 6 cycles Dose modification: 600 mg/m2 (original dose 750 mg/m2, Cycle 2, Reason: Provider Judgment) Administration: 1,240 mg (05/18/2019), 1,240 mg (06/08/2019) fosaprepitant (EMEND) 150 mg, dexamethasone (DECADRON) 12 mg in sodium chloride 0.9 % 145 mL IVPB, , Intravenous,  Once, 2 of 6 cycles Administration:  (05/18/2019),  (06/08/2019)  for chemotherapy treatment.    07/19/2019 -  Chemotherapy   The patient had riTUXimab (RITUXAN) 700 mg in sodium chloride 0.9 % 250 mL (2.1875 mg/mL) infusion, 375 mg/m2 = 700 mg, Intravenous,  Once, 11 of 11 cycles Administration: 700 mg (07/19/2019), 700 mg (08/16/2019), 700 mg (09/13/2019), 700 mg (10/10/2019), 700 mg (11/07/2019), 700 mg (12/07/2019), 700 mg (01/04/2020), 700 mg (02/01/2020), 700 mg (03/05/2020), 700 mg (04/02/2020), 700 mg (04/30/2020) riTUXimab-pvvr (RUXIENCE) 700 mg in sodium chloride 0.9 % 250 mL (2.1875 mg/mL) infusion, 375 mg/m2 = 700 mg (original dose ), Intravenous,  Once, 0 of 1 cycle Dose modification: 375 mg/m2 (Cycle 12)  for chemotherapy treatment.     PHYSICAL EXAMINATION:  Vitals:   07/21/20 2229 07/22/20 0606  BP: (!) 103/38 (!) 117/43  Pulse: 68 67  Resp: 20 20  Temp: 98 F (36.7 C) 97.7 F (36.5 C)  SpO2: 91% 93%   Filed Weights   07/19/20 0500 07/20/20 0635 07/21/20 0500  Weight: 66.6 kg 63 kg 66.1 kg    Intake/Output  from previous day: 08/02 0701 - 08/03 0700 In: 120 [P.O.:120] Out: 650 [Urine:650]  GENERAL: More alert this morning HEENT: No thrush, mouth is dry LYMPH: Soft mobile left cervical and left axillary nodes, smaller on exam today LUNGS: Improved aeration on the right HEART: Regular rate and rhythm ABDOMEN: Nontender, spleen tip palpable  in the left subcostal region NEURO: Alert and oriented x3 Vascular: No leg edema  LABORATORY DATA:  I have reviewed the data as listed CMP Latest Ref Rng & Units 07/22/2020 07/21/2020 07/19/2020  Glucose 70 - 99 mg/dL 91 96 112(H)  BUN 8 - 23 mg/dL _0 Creatinine 0.44 - 1.00 mg/dL 1.48(H) 1.26(H) 0.76  Sodium 135 - 145 mmol/L 138 136 138  Potassium 3.5 - 5.1 mmol/L 3.4(L) 3.5 3.8  Chloride 98 - 111 mmol/L 98 96(L) 96(L)  CO2 22 - 32 mmol/L 29 29 33(H)  Calcium 8.9 - 10.3 mg/dL 8.0(L) 8.0(L) 8.2(L)  Total Protein 6.5 - 8.1 g/dL 5.2(L) 5.3(L) 5.4(L)  Total Bilirubin 0.3 - 1.2 mg/dL 0.8 1.0 1.5(H)  Alkaline Phos 38 - 126 U/L 132(H) 141(H) 141(H)  AST 15 - 41 U/L 42(H) 44(H) 50(H)  ALT 0 - 44 U/L _1 Lab Results  Component Value Date   WBC 9.6 07/22/2020   HGB 9.9 (L) 07/22/2020   HCT 30.3 (L) 07/22/2020   MCV 102.7 (H) 07/22/2020   PLT 206 07/22/2020   NEUTROABS 3.0 07/21/2020    DG Chest 1 View  Result Date: 07/08/2020 CLINICAL DATA:  86 year old female status post right thoracentesis. EXAM: CHEST  1 VIEW COMPARISON:  Chest radiograph dated 07/06/2020. FINDINGS: Right-sided Port-A-Cath in similar position. Bilateral patchy airspace opacities, right greater left with interval progression compared to prior radiograph may represent edema or pneumonia. Clinical correlation is recommended. Small bilateral pleural effusions. No pneumothorax. Stable cardiac silhouette. Atherosclerotic calcification of the aorta. No acute osseous pathology. IMPRESSION: 1. Small bilateral pleural effusions.  No pneumothorax. 2. Bilateral airspace opacities, progressed since the prior radiograph. Electronically Signed   By: Anner Crete M.D.   On: 07/08/2020 18:26   DG Chest 1 View  Result Date: 07/04/2020 CLINICAL DATA:  Status post right thoracentesis. EXAM: CHEST  1 VIEW COMPARISON:  Chest x-ray from yesterday. FINDINGS: Unchanged right chest wall port catheter. The heart size and  mediastinal contours are within normal limits. Normal pulmonary vascularity. Trace residual right pleural effusion status post thoracentesis. Improved aeration at the right lung base. No pneumothorax. Unchanged small left pleural effusion and basilar atelectasis. No acute osseous abnormality. IMPRESSION: 1. Trace residual right pleural effusion status post thoracentesis. No pneumothorax. 2. Unchanged small left pleural effusion and basilar atelectasis. Electronically Signed   By: Titus Dubin M.D.   On: 07/04/2020 11:42   DG Chest 2 View  Result Date: 07/14/2020 CLINICAL DATA:  Dyspnea. EXAM: CHEST - 2 VIEW COMPARISON:  07/09/2020 FINDINGS: Right chest wall port a catheter is noted with tip at the cavoatrial junction. Moderate right pleural effusion the with [place] opacification of the right lower lobe is again noted. When compared with the previous exam there is improved aeration to the right and left midlung zones and left lower lobe. IMPRESSION: 1. Improved aeration to the right and left lower lobes. 2. Persistent right pleural effusion. Electronically Signed   By: Kerby Moors M.D.   On: 07/14/2020 12:16   DG Chest 2 View  Result Date: 07/09/2020 CLINICAL DATA:  Follow-up pleural effusion. EXAM: CHEST - 2 VIEW  COMPARISON:  07/08/20 FINDINGS: Right chest wall port a catheter is noted with tip projecting over the cavoatrial junction. Interval increase in volume of right pleural effusion. Mild diffuse pulmonary edema. Bilateral airspace opacities are noted within the left upper lobe, right midlung and right base concerning for multifocal pneumonia. IMPRESSION: 1. Increase in volume of right pleural effusion with mild interstitial edema 2. Bilateral airspace opacities concerning for multifocal pneumonia. Electronically Signed   By: Kerby Moors M.D.   On: 07/09/2020 11:21   DG Chest 2 View  Result Date: 07/06/2020 CLINICAL DATA:  Worsening shortness of breath. EXAM: CHEST - 2 VIEW COMPARISON:   07/04/2020 FINDINGS: The cardiac silhouette remains grossly normal in size, spur partially obscured by significantly increased patchy opacity in the right lower lung zone. Small amount of posterior pleural fluid. Mild linear density at the left lung base. Right subclavian porta catheter tip in the region of the superior cavoatrial junction. Unremarkable bones. IMPRESSION: 1. Significantly increased right lower lung zone pneumonia. 2. Small amount of posterior pleural fluid. 3. Mild left basilar atelectasis. Electronically Signed   By: Claudie Revering M.D.   On: 07/06/2020 16:24   DG Chest 2 View  Result Date: 07/03/2020 CLINICAL DATA:  Dyspnea, pleural effusions EXAM: CHEST - 2 VIEW COMPARISON:  06/26/2020 FINDINGS: Frontal and lateral views of the chest demonstrates stable right chest wall port. Cardiac silhouette is unremarkable. There is now a moderate right pleural effusion with right basilar consolidation. Decreased left pleural effusion, with only trace fluid at the left costophrenic angle. Persistent retrocardiac consolidation. No pneumothorax. No acute bony abnormalities. IMPRESSION: 1. Decreased left pleural effusion, with persistent left basilar consolidation. 2. Increasing right pleural effusion, with increasing right basilar consolidation. Electronically Signed   By: Randa Ngo M.D.   On: 07/03/2020 16:11   DG Chest 2 View  Result Date: 06/26/2020 CLINICAL DATA:  Dyspnea. EXAM: CHEST - 2 VIEW COMPARISON:  Chest CT 05/22/2020 FINDINGS: Right chest port remains in place. Hazy opacity at the left lung base consistent with pleural effusion and airspace disease/atelectasis. Right lung is clear. Upper normal heart size. Mild right hilar prominence likely secondary to combination of vascular structures and adenopathy is seen on recent CT. Fullness in the upper mediastinum likely secondary to adenopathy. No pneumothorax. No pulmonary edema. Surgical clips in the left axilla. IMPRESSION: 1. Hazy opacity  at the left lung base consistent with pleural effusion and airspace disease/atelectasis. 2. Mild right hilar prominence likely secondary to combination of vascular structures and adenopathy seen on recent CT. Electronically Signed   By: Keith Rake M.D.   On: 06/26/2020 15:54   CT ANGIO CHEST PE W OR WO CONTRAST  Result Date: 07/07/2020 CLINICAL DATA:  Lymphoma, recurrent right pleural effusion bloody tap EXAM: CT ANGIOGRAPHY CHEST WITH CONTRAST TECHNIQUE: Multidetector CT imaging of the chest was performed using the standard protocol during bolus administration of intravenous contrast. Multiplanar CT image reconstructions and MIPs were obtained to evaluate the vascular anatomy. CONTRAST:  132m OMNIPAQUE IOHEXOL 350 MG/ML SOLN COMPARISON:  None. FINDINGS: Cardiovascular: Aortic atherosclerosis. Normal heart size. Central pulmonary arteries are patent. Mediastinum/Nodes: Adenopathy is identified in the supraclavicular region, bilateral axilla, and mediastinum. Nodes appear and measures slightly smaller in size. Lungs/Pleura: Moderate right and mild to moderate left pleural effusions measuring simple fluid in density. Imaging performed during expiration. Persistent atelectasis at the left lung base. New atelectasis/consolidation at the right lung base. Interlobular septal thickening the non dependent right lung may reflect asymmetric edema. Upper  Abdomen: Ascites. Unchanged ill-defined hypoattenuating lesion in the superior spleen. Musculoskeletal: No new abnormality. Review of the MIP images confirms the above findings. IMPRESSION: Moderate right and mild to moderate left pleural effusions. Right basilar atelectasis/consolidation and left basilar atelectasis. Interlobular septal thickening in the right lung may reflect asymmetric edema. Multifocal adenopathy appears stable to slightly improved. Electronically Signed   By: Macy Mis M.D.   On: 07/07/2020 14:33   CT Abdomen Pelvis W Contrast  Result  Date: 06/23/2020 CLINICAL DATA:  86 year old female with abdominal pain, nausea and vomiting. History of lymphoma. EXAM: CT ABDOMEN AND PELVIS WITH CONTRAST TECHNIQUE: Multidetector CT imaging of the abdomen and pelvis was performed using the standard protocol following bolus administration of intravenous contrast. CONTRAST:  56m OMNIPAQUE IOHEXOL 300 MG/ML  SOLN COMPARISON:  CT of the chest abdomen pelvis dated 05/22/2020. FINDINGS: Lower chest: Partially visualized moderate left and trace right pleural effusions, new since the prior CT. There is associated partial compressive atelectasis of the left lower lobe. Pneumonia is not excluded. Clinical correlation is recommended. No intra-abdominal free air. Interval development of a small ascites, new or significantly increased since the prior CT. Hepatobiliary: A 1 cm hypodense focus along the posterior liver capsule is not characterized but may represent a cyst or focal area of scarring. This is similar to prior CT. Subcentimeter hypodense focus in the anterior liver is too small to characterize. There is mild intrahepatic biliary ductal dilatation, likely post cholecystectomy. The common bile duct is dilated measuring 14 mm. No retained calcified stone noted in the central CBD. Pancreas: The pancreas is unremarkable as visualized. Spleen: Mildly enlarged spleen measuring 15 cm in craniocaudal length. Adrenals/Urinary Tract: The adrenal glands are poorly visualized but grossly unremarkable. There is mild fullness of the renal collecting systems bilaterally without frank hydronephrosis. Mild hazy appearance of the urothelium noted. Correlation with urinalysis recommended to exclude UTI. There is symmetric enhancement and excretion of contrast by both kidneys. Subcentimeter left renal hypodense focus is not characterized. The urinary bladder is partially distended. Probable chronic bladder wall thickening and perivesical stranding. Correlation with urinalysis  recommended to exclude UTI. Stomach/Bowel: There is sigmoid diverticulosis without definite active inflammatory changes. There is postsurgical changes of bowel with anastomotic suture in the right lower quadrant. No definite evidence of bowel obstruction. Evaluation however is limited in the absence of oral contrast. Vascular/Lymphatic: There is moderate aortoiliac atherosclerotic disease. The IVC is unremarkable as visualized. The SMV, splenic vein, and main portal vein are patent. No portal venous gas. Bulky retroperitoneal adenopathy encasing the abdominal aorta and IVC as well as enlarged bilateral iliac chain lymph nodes in keeping with history of lymphoma. Overall the degree of adenopathy is relatively similar to prior CT. Reproductive: The uterus is poorly visualized. Other: Diffuse mesenteric edema, new since the prior CT. There is scattered mesenteric adenopathy as seen previously. Musculoskeletal: Degenerative changes of the spine. Minimal compression fracture of the anterior superior and inferior endplates of the L3, new since the prior CT, likely acute or subacute. IMPRESSION: 1. Interval development of partially visualized moderate left and trace right pleural effusions, as well as development of diffuse mesenteric edema and ascites, new since the prior CT. 2. Bulky retroperitoneal and iliac chain adenopathy in keeping with history of lymphoma. Overall the degree of adenopathy is relatively similar to prior CT. 3. Mild splenomegaly. 4. Minimal compression fracture of the superior and inferior endplate of L3, likely acute or subacute. 5. Sigmoid diverticulosis. 6. Aortic Atherosclerosis (ICD10-I70.0). Electronically Signed  By: Anner Crete M.D.   On: 06/23/2020 16:39   DG CHEST PORT 1 VIEW  Result Date: 07/17/2020 CLINICAL DATA:  Status post thoracentesis. EXAM: PORTABLE CHEST 1 VIEW COMPARISON:  Radiograph yesterday. FINDINGS: Decreased size of right pleural effusion post thoracentesis. Small  residual at the right lung base. No visualized pneumothorax. Right chest port remains in place. Heart is normal in size. Left lung is clear without focal abnormality. No significant left pleural effusion. IMPRESSION: Decreased size of right pleural effusion post thoracentesis, small residual at the right lung base. No visualized pneumothorax. Electronically Signed   By: Keith Rake M.D.   On: 07/17/2020 17:36   DG CHEST PORT 1 VIEW  Result Date: 07/16/2020 CLINICAL DATA:  86 year old female with history of shortness of breath. Pleural effusion. EXAM: PORTABLE CHEST 1 VIEW COMPARISON:  Chest x-ray 07/14/2020. FINDINGS: Right subclavian single-lumen porta cath with tip terminating at the superior cavoatrial junction. Enlarging large right pleural effusion with extensive passive atelectasis in the right mid to lower lung. Left lung is clear. No left pleural effusion. No pneumothorax. No evidence of pulmonary edema. Heart size appears normal. Aortic atherosclerosis. Surgical clips project over the left axillary region, presumably from prior left axillary nodal dissection. IMPRESSION: 1. Enlarging right pleural effusion with worsening passive atelectasis in the right lung. 2. Aortic atherosclerosis. Electronically Signed   By: Vinnie Langton M.D.   On: 07/16/2020 10:17   ECHOCARDIOGRAM COMPLETE  Result Date: 07/07/2020    ECHOCARDIOGRAM REPORT   Patient Name:   Tracy Mclaughlin Date of Exam: 07/07/2020 Medical Rec #:  [id number]            Height:       68.0 in Accession #:    [id number]           Weight:       174.0 lb Date of Birth:  1934/04/16           BSA:          1.926 m Patient Age:    86 years             BP:           132/65 mmHg Patient Gender: F                    HR:           855 bpm. Exam Location:  Inpatient Procedure: 2D Echo Indications:    Atrial tachycardia [facility]) [[id number]]  History:        Patient has prior history of Echocardiogram examinations, most                 recent 05/31/2019.  Risk Factors:Hypertension. Lymphoma , thyroid                 disease, thyroid cancer. Past history of afib.  Sonographer:    Darlina Sicilian RDCS Referring Phys: [place]  1. Left ventricular ejection fraction, by estimation, is 60 to 65%. The left ventricle has normal function. The left ventricle has no regional wall motion abnormalities. Left ventricular diastolic parameters were normal.  2. Right ventricular systolic function is normal. The right ventricular size is normal.  3. Moderate pleural effusion in both left and right lateral regions.  4. The mitral valve is normal in structure. Trivial mitral valve regurgitation. No evidence of mitral stenosis.  5. The aortic valve is tricuspid. Aortic valve regurgitation is not visualized. No aortic stenosis  is present.  6. The inferior vena cava is normal in size with <50% respiratory variability, suggesting right atrial pressure of 8 mmHg. Conclusion(s)/Recommendation(s): Normal biventricular function without evidence of hemodynamically significant valvular heart disease. FINDINGS  Left Ventricle: Left ventricular ejection fraction, by estimation, is 60 to 65%. The left ventricle has normal function. The left ventricle has no regional wall motion abnormalities. The left ventricular internal cavity size was normal in size. There is  no left ventricular hypertrophy. Left ventricular diastolic parameters were normal. Right Ventricle: The right ventricular size is normal. No increase in right ventricular wall thickness. Right ventricular systolic function is normal. Left Atrium: Left atrial size was normal in size. Right Atrium: Right atrial size was normal in size. Pericardium: Trivial pericardial effusion is present. Mitral Valve: The mitral valve is normal in structure. Trivial mitral valve regurgitation. No evidence of mitral valve stenosis. Tricuspid Valve: The tricuspid valve is normal in structure. Tricuspid valve regurgitation is mild . No  evidence of tricuspid stenosis. Aortic Valve: The aortic valve is tricuspid. Aortic valve regurgitation is not visualized. No aortic stenosis is present. Pulmonic Valve: The pulmonic valve was grossly normal. Pulmonic valve regurgitation is trivial. Aorta: The aortic root, ascending aorta and aortic arch are all structurally normal, with no evidence of dilitation or obstruction. Venous: The inferior vena cava is normal in size with less than 50% respiratory variability, suggesting right atrial pressure of 8 mmHg. IAS/Shunts: No atrial level shunt detected by color flow Doppler. Additional Comments: There is a moderate pleural effusion in both left and right lateral regions.  LEFT VENTRICLE PLAX 2D LVIDd:         5.10 cm  Diastology LVIDs:         3.30 cm  LV e' lateral:   8.27 cm/s LV PW:         0.90 cm  LV E/e' lateral: 9.2 LV IVS:        0.70 cm  LV e' medial:    6.64 cm/s LVOT diam:     1.80 cm  LV E/e' medial:  11.5 LV SV:         30 LV SV Index:   15 LVOT Area:     2.54 cm  RIGHT VENTRICLE RV S prime:     14.80 cm/s TAPSE (M-mode): 2.4 cm LEFT ATRIUM           Index       RIGHT ATRIUM           Index LA diam:      3.00 cm 1.56 cm/m  RA Area:     13.80 cm LA Vol (A2C): 44.7 ml 23.21 ml/m RA Volume:   33.50 ml  17.39 ml/m LA Vol (A4C): 42.6 ml 22.12 ml/m  AORTIC VALVE LVOT Vmax:   77.70 cm/s LVOT Vmean:  49.700 cm/s LVOT VTI:    0.116 m  AORTA Ao Root diam: 3.00 cm MITRAL VALVE MV Area (PHT): 4.96 cm    SHUNTS MV Decel Time: 153 msec    Systemic VTI:  0.12 m MV E velocity: 76.30 cm/s  Systemic Diam: 1.80 cm MV A velocity: 78.80 cm/s MV E/A ratio:  0.97 Buford Dresser MD Electronically signed by Buford Dresser MD Signature Date/Time: 07/07/2020/5:02:22 PM    Final    [place] ASCITES (ABDOMEN LIMITED)  Result Date: 07/01/2020 CLINICAL DATA:  History of lymphoma, now with concern for symptomatic intra-abdominal ascites. Please perform ascites search ultrasound and ultrasound-guided paracentesis  as indicated. EXAM: LIMITED  ABDOMEN ULTRASOUND FOR ASCITES TECHNIQUE: Limited ultrasound survey for ascites was performed in all four abdominal quadrants. COMPARISON:  CT abdomen pelvis-06/23/2020 FINDINGS: Sonographic evaluation demonstrates a trace amount of intra-abdominal ascites, too small to allow for safe ultrasound-guided paracentesis. Note is made of small bilateral pleural effusions, the right side of which appears new compared to abdominal CT performed 06/23/2020. IMPRESSION: 1. Trace amount of intra-abdominal ascites, too small to allow for safe ultrasound-guided paracentesis. No paracentesis attempted. 2. Small bilateral pleural effusions - the left-sided pleural effusion appears similar to abdominal CT performed 06/23/2020 however the right-sided pleural effusion appears new of the CT. Electronically Signed   By: Sandi Mariscal M.D.   On: 07/01/2020 15:11   US Abdomen Limited RUQ  Result Date: 07/02/2020 CLINICAL DATA:  Abnormal LFTs EXAM: ULTRASOUND ABDOMEN LIMITED RIGHT UPPER QUADRANT COMPARISON:  CT 06/23/2020.  Ultrasound 07/01/2020 FINDINGS: Gallbladder: Prior cholecystectomy Common bile duct: Diameter: Prominent measuring up to 10 mm, likely related to post cholecystectomy state. Liver: Heterogeneous, slightly increased echotexture. Subtle nodular contours to the liver surface. Findings raise the possibility of cirrhosis. No suspicious focal hepatic abnormality. Portal vein is patent on color Doppler imaging with normal direction of blood flow towards the liver. Other: Ascites and right effusion noted. Prominent porta hepatis lymph nodes. IMPRESSION: Heterogeneous, increased echotexture throughout the liver. Subtle nodular contour seen in some areas of the liver. Findings suggest the possibility of cirrhosis. Perihepatic ascites and right pleural effusion. Mild biliary ductal dilatation, likely related to post cholecystectomy state. Prominent porta hepatis lymph nodes, likely related to liver  disease. Electronically Signed   By: Rolm Baptise M.D.   On: 07/02/2020 08:52   US THORACENTESIS ASP PLEURAL SPACE W/IMG GUIDE  Result Date: 07/08/2020 INDICATION: Lymphoma, remote colon and thyroid cancers, dyspnea, right pleural effusion.request for diagnostic and therapeutic thoracentesis. EXAM: ULTRASOUND GUIDED RIGHT THORACENTESIS MEDICATIONS: 1% lidocaine 10 mL COMPLICATIONS: None immediate. PROCEDURE: An ultrasound guided thoracentesis was thoroughly discussed with the patient and questions answered. The benefits, risks, alternatives and complications were also discussed. The patient understands and wishes to proceed with the procedure. Written consent was obtained. Ultrasound was performed to localize and mark an adequate pocket of fluid in the right chest. The area was then prepped and draped in the normal sterile fashion. 1% Lidocaine was used for local anesthesia. Under ultrasound guidance a 6 Fr Safe-T-Centesis catheter was introduced. Thoracentesis was performed. The catheter was removed and a dressing applied. FINDINGS: A total of approximately 500 mL of red fluid was removed. Samples were sent to the laboratory as requested by the clinical team. IMPRESSION: Successful ultrasound guided right thoracentesis yielding 500 mL of pleural fluid. Read by: Gareth Eagle, PA-C Electronically Signed   By: Jerilynn Mages.  Shick M.D.   On: 07/08/2020 16:31   US THORACENTESIS ASP PLEURAL SPACE W/IMG GUIDE  Result Date: 07/04/2020 INDICATION: Patient with history of lymphoma, remote colon and thyroid cancers, dyspnea, right pleural effusion. Request made for diagnostic and therapeutic right thoracentesis. EXAM: ULTRASOUND GUIDED DIAGNOSTIC AND THERAPEUTIC RIGHT THORACENTESIS MEDICATIONS: 1% lidocaine to skin and subcutaneous tissue COMPLICATIONS: None immediate. PROCEDURE: An ultrasound guided thoracentesis was thoroughly discussed with the patient and questions answered. The benefits, risks, alternatives and  complications were also discussed. The patient understands and wishes to proceed with the procedure. Written consent was obtained. Ultrasound was performed to localize and mark an adequate pocket of fluid in the right chest. The area was then prepped and draped in the normal sterile fashion. 1% Lidocaine was used for  local anesthesia. Under ultrasound guidance a 6 Fr Safe-T-Centesis catheter was introduced. Thoracentesis was performed. The catheter was removed and a dressing applied. FINDINGS: A total of approximately 1.6 liters of blood-tinged fluid was removed. Samples were sent to the laboratory as requested by the clinical team. IMPRESSION: Successful ultrasound guided diagnostic and therapeutic right thoracentesis yielding 1.6 liters of pleural fluid. Read by: Rowe Robert, PA-C Electronically Signed   By: Lucrezia Europe M.D.   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 Status post weekly Rituxan x4 03/01/2012 through 03/22/2012. She completed 4 "maintenance" doses of Rituxan, last on 12/19/2012. A restaging CT on 02/09/2013 showed no evidence of lymphoma.   Lymph node lateral to the thyroid bed on a neck ultrasound 02/21/2014, status post an FNA biopsy concerning for a lymphoproliferative disorder.  PET scan 09/28/2016 with active lymphoma within the neck, chest, abdomen, pelvis; splenic enlargement and hypermetabolism suspicious for splenic involvement.  Initiation of Rituxan weekly 4 09/29/2016  Initiation of maintenance Rituxan on a 3 month schedule 12/23/2016; final Rituxan 08/31/2018  Thyroid ultrasound 02/07/2019-left cervical lymphadenopathy  PET scan 03/08/2019-extensive recurrent hypermetabolic lymphoma involving the neck, chest, abdomen and pelvis.  03/16/2019 left cervical lymph node biopsy-features consistent with previously  diagnosed non-Hodgkin's B-cell lymphoma, phenotypically consistent with marginal zone lymphoma. Flow cytometry with lambda restricted B-cell population without expression of CD5 or CD10 comprising 87% of all lymphocytes.  Cycle 1 bendamustine/Rituxan 03/22/2019  Excision deep left axillary lymph nodes 05/04/2019-non-Hodgkin's B-cell lymphoma with differential including a marginal zone lymphoma and atypical small lymphocytic lymphoma. Flow cytometry with monoclonal B-cell population without expression of CD5 or CD10, comprises 96% of all lymphocytes.  Cycle 1 CHOP/Rituxan 05/18/2019  Cycle 2 CHOP/Rituxan 06/08/2019  CTs 06/15/2019-partial improvement in diffuse adenopathy, stable mild splenomegaly  Cycle 1 Revlimid/rituximab 07/19/2019 (Revlimid start 07/20/2019)  Cycle 2 Revlimid/rituximab 08/16/2019 (Revlimid placed on hold 08/30/2019 due to neutropenia)  Cycle 3 of Revlimid/rituximab 09/13/2019 (Revlimid schedule changed to 14 days on/14 days off)  Cycle 4 Revlimid/rituximab 10/10/2019  Cycle 5 Revlimid/rituximab 11/07/2019  Cycle 6 Revlimid/rituximab 12/07/2019  CTs 12/28/2019-diffuse lymphadenopathy-slightly increased compared to 06/15/2019  Cycle 7 Revlimid/rituximab 01/04/2020  Cycle 8 Revlimid/Rituxan 02/01/2020  Cycle 9 Revlimid/Rituxan 03/05/2020  Cycle 10 Revlimid/Rituxan 04/02/2020  Cycle 11 Revlimid/Rituxan 04/30/2020  CT 05/22/2020-mild increase in left supraclavicular adenopathy, mild increase in chest, retroperitoneal, and pelvic adenopathy. Stable mild splenomegaly.  Acalabrutinib started 06/19/2020  Acalabrutinib discontinued on hospital admission 06/23/2020 secondary to nausea and diarrhea  acalabrutinib resumed 06/26/2020  CT abdomen/pelvis 06/23/2020-moderate left and trace right pleural effusions, new diffuse mesenteric edema and ascites, stable retroperitoneal and iliac adenopathy, mild splenomegaly  Acalabrutinib discontinued 07/02/2020 secondary to altered mental  status  Acalabrutinib resumed 07/17/2020  2. Stage IV (T1bN1b M0) papillary thyroid cancer, status post a thyroidectomy with reimplantation of the left superior parathyroid gland on 05/23/2012, status post radioactive iodine therapy, followed by Dr. Buddy Duty.  3. Stage II (T3 N0) colon cancer, status post a right colectomy 10/19/2011, last colonoscopy April 2015-sigmoid adenoma removed.  4. History of a pulmonary embolism December 2012.  5. History of Atrial fibrillation 6. Iron deficiency anemia-new 03/18/2014. Hemoccult positive stool. The anemia corrected with iron. No longer taking iron.  Status post an upper endoscopy and colonoscopy by Dr. Carlean Purl April 2015 with no bleeding source identified, benign adenoma removed from the sigmoid colon. 7. Report of an upper gastric intestinal bleed fall  2016-managed in [place]. [name] 8. Left knee replacement May 2017, repeat left knee surgery May 2018 9. Pruritic rash 07/22/2016 10. Nausea and diarrhea 04/02/2019-stool negative for C. difficile toxin 11. 06/18/2019 [facility] admission for symptomatic anemia. 12.  Admission 06/23/2020 with nausea and diarrhea 13.  Respiratory failure, bilateral pleural effusions  Right thoracentesis 07/04/2020-culture and cytology negative  CT chest 07/07/2020-right and left pleural effusions, bilateral basilar atelectasis, interlobular septal thickening in the right lung-asymmetric edema?  Improved adenopathy  Right thoracentesis 07/08/2020-flow cytometry with a small clonal B-cell population  Right thoracentesis 07/17/2020-flow cytometry and cytology pending  Tracy Mclaughlin appears improved.  She remains off oxygen. She was restarted on acalabrutinib 07/17/2020.  Tolerating it well.  Liver enzymes are normal.  LDH from yesterday improved compared to baseline.  Cytology and flow cytometry from the 07/17/2020 thoracentesis have not been resulted.  The patient appears to be stable for discharge to SNF.  Recommend for nursing to  continue to mobilize patient.  The patient and her husband prefer SNF in the [facility] area.  Recommend CBC with differential and CMET at SNF next week with results to be faxed to Dr. Gearldine Shown office. The creatinine is elevated and she has limited oral intake.  I would consider stopping the furosemide.  Recommendations: 1.  Continue acalabrutinib 100 mg daily.   2.  Monitor O2 sats closely on room air. 3.  Follow liver enzymes while on acalabrutinib 4.  Discontinue furosemide 5..  Okay from our standpoint to discharge to SNF when bed available.  We will arrange for outpatient follow-up with cancer center.  Recommend a CBC and CMP in 1 week at the skilled nursing facility, follow-up at the Cancer center week of 08/04/2020.   LOS: 28 days   Mikey Bussing 07/22/20

## 2020-07-22 NOTE — Progress Notes (Addendum)
OT Cancellation Note  Patient Details Name: Tracy Mclaughlin MRN: [id number] DOB: 1934-11-30   Cancelled Treatment:    Reason Eval/Treat Not Completed: Patient declined, no reason specified  Pt adamantly refused OT this morning despite gentle encouragement. Pt reported, "there's no point since I'm definitely going home today."  Pt was offered assistance in preparing for home, but stated that she preferred to rest in order to have energy for the discharge.   Returned in afternoon, and pt continued to refuse, stating that she expected her husband at any moment and wished to be free to visit with him, then take another nap.  Pt now expects d/c tomorrow.  Pt provided with energy conservation handout and encouraged to review for next OT session.  Pt encouraged once more to work with OT, but was adamant in refusal.  Will continue efforts.   Julien Girt 07/22/2020, 1:02 PM

## 2020-07-22 NOTE — Progress Notes (Signed)
PROGRESS NOTE    Tracy Mclaughlin   CHE:[id number]  DOB: 07/25/1934  DOA: 06/23/2020     30  PCP: [name], PA  CC: diarrhea, weakness   Hospital Course: Tracy Mclaughlin is an 86 y.o. 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 [name], hypothyroidism, history of A. fib, pancytopenia presents to the ED with 3-4 day history of nausea vomiting and diarrhea and abdominal pain-and right upper quadrant pain. symptoms started after new chemo on Thursday.Patient denies any fever,  chest pain. vomitted x 3 today and had non bloody diarrhea 4 times today. She was hemodynamically stable, afebrile, UA positive for UTI, CT scan abdomen done that showed free fluid with ascites and mesenteric edema, case was discussed Dr. [name] from oncology,  advised UTI treatment with Zosyn . Patient  was admitted, being managed symptomatically, chemo was held,  diarrhea improved subsequently,  oncology put the patient back on chemo overall tolerating well. Having nausea anorexia suspecting this is related to her lymphoma. Patient has been started on prednisone to help with appetite and also received trazodone for sleep. Patient remains deconditioned.   Patient was also noticed to have shortness of breath/dyspnea with activity-imaging studies showed pleural effusion underwent right-sided thoracentesis x 2  with 1.6 L of blood-tinged fluid removal, cytology reactive mesothelial celss, no growth on culture. Patient went into rapid tachycardia supraventricular 7/19 a.m. and also with hypoxia-underwent CTA chest : No PE but right-sided pleural effusion atelectasis/consolidation-cardiologist consulted.  Patient converted to sinus rhythm spontaneously. received few days of IV antibiotics , Procalcitonin has been negative/normal- no fever no leukocytosis. Patient continues to remain hypoxic and in  respiratory failure, chest x-ray shows reaccumulation of the right pleural effusion.  Pulmonology consulted for repeat thoracocentesis or potentially Pleurx catheter. She underwent thoracocentesis 1400 cc of fluid removed sent for cytology flow cytometry. Oncology resumed acalabrutinib plan if she does not improve would recommend hospice.   Patient seems to be improving, she has been refusing skilled nursing facility and home with home services.  After multiple conversations she finally agreed for skilled nursing facility awaiting insurance authorization.   Interval History:  No events overnight. Patient seen resting in bed in NAD this am and stated breathing felt comfortable. Understands plan of awaiting SNF.   Old records reviewed in assessment of this patient  ROS: Constitutional: positive for fatigue, Respiratory: negative for cough, Cardiovascular: negative for chest pain and Gastrointestinal: negative for abdominal pain  Assessment & Plan: Principal Problem:   Recurrent pleural effusion on right Active Problems:   Non Hodgkin's lymphoma (HCC)   Hypertension   Anemia, iron deficiency   PAF (paroxysmal atrial fibrillation) (HCC)   Anemia   Pancytopenia (HCC)   Nausea & vomiting   UTI (urinary tract infection)   Ascites   Acute respiratory failure with hypoxia and hypercapnia (HCC)  Acute hypoxic respiratory failure due to pleural effusion / No PE on the CT. - Stable, still requiring 1-2 L of oxygen, to maintain O2 sat greater than 92% - Underwent thoracocentesis twice. - Pleural cultures grew gram-positive cocci. - initiated on  broad-spectrum antibiotics  Vanco and Zosyn . - narrowed down antibiotics according to final cultures and sensitivity. - Pleural fluid grew staph epi, continued with vancomycin and DC Zosyn. - As needed duo nebs  - Low suspicion for pneumonia, sepsis. - Pulmonology consulted for repeat thoracocentesis or potentially Pleurx catheter. - She  underwent thoracocentesis, 1400 cc fluid drained, sent for flow cytometry, cytology and other studies. - Cultures sensitive to Bactrim , antibiotics changed. Plan is to complete a 14 day course of Bactrim, end date 07/23/20 -Patient is very deconditioned, encouraged out of bed to chair.  Intractable nausea vomitingdiarrhea andabdominal pain:-Resolved - Unclear etiology suspecting due to [place] held on admission and resumed 7/8. Diarrhea improved. Acalabrutinib was again discontinued as her LFTs were worsening. Has new onset ascites on the CT scan-but too small to tap on the ultrasound . Cont supportive measure.  - Prednisone was added to stimulate appetite but discontinued due to confusion.  Recurrent pleural effusion: -LikelyfromLymphoma Vsinfection or 2/2 Acalabrutinib ( less likely per oncology) . S/p rt thoracentesis 1.6 L blood-tinged fluid was removed. Pleural fluid Gram stain no organism, culture no growth so far, cytology:  reactive mesothelial cell.  -CTA shows moderate right and mild to moderate left pleural effusions-BNP around 400  -Echo shows preserved EJF 60 to 65%. no diastolic dysfunction, unclear if related to CHF versus low Albumin.   Narrow complex tachycardia->>>Resolved.  Converted to sinus rhythm. No recurrent episode -remained stable- Cardiology following  -Continue diltiazem CD 180 nightly, Lopressor 12.5 mg p.o. twice daily, -Following thyroid function -2D echocardiogram reviewed.  Ascites: Stable. -new onset ascites on the CT scan-too small to tap on paracentesis per IR.  Abnormal LFTS: - Improving 2/2 Acalabrutinib-which is discontinued.  LFTs improving. RUQ [place]- possible cirrhotic changes, hepatitis panel negative.  Acalabrutinib resumed, continue to monitor liver enzymes  Acute metabolic encephalopathy from polypharmacy: >>> Resolved -Mentation back to baseline. had confusion/Lethargy: 7/14 a.m likley  polypharmacy and pt feels improved after stopping trazodone/prednisone.  -Continue to complain of insomnia, sleeping medication as needed She is on Xanax low-dose continue for anxiety. Continue supportive care.   E coli UTI POA: Completed 7 days of antibiotics.   Non Hodgkin's lymphoma -Remained stable, -Oncology following -with progressive decline, patientwasstartedpast weekonAcalabrutinib she did not have significant response to chemotherapy last year.Acalabrutinibwas held on admission and resumed but again stopped due to worsening LFTs by oncology.  Oncology resumed acalabrutinib plan if she does not improve would recommend hospice. Monitor liver enzymes while being on acalabrutinib.  Failure to thrive/deconditioning: Remains weak and deconditioned. -PT continues to recommend SNF -She refused SNF. She is high risk for readmission. Husband concerned about poor support. -Patient seems not interested in physical therapy.  Encouraged to get out of bed to chair and participate in therapy. -After multiple conversations patient has finally agreed for skilled nursing facility. Case manager notified,  authorization in process  Hypertension:BP is controlled. Continue Cardizem.   Pancytopenia with anemia/Thrombocytopenia in the setting of lymphoma/chemo: -Hemoglobin and WBC count overall stable. Monitor.  Heparin was discontinued by hematology due to bilateral lower leg bruise and thrombocytopenia.   History of intermittent atrial fibrillation on Cardizem: -Not on anticoagulation due to history of subarachnoid hemorrhage as per cardiology. Bruise on the skin heparin discontinued per oncology. improving.  DVT prophylaxis: SCD Code Status: DNR Family Communication: none present Disposition Plan:  Status is: Inpatient  Remains inpatient appropriate because:Unsafe d/c plan, IV treatments appropriate due to intensity of illness or inability to take PO and Inpatient  level of care appropriate due to severity of illness   Dispo: The patient is from: Home              Anticipated d/c is to: SNF              Anticipated  d/c date is: 2 days              Patient currently is not medically stable to d/c.       Objective: Blood pressure (!) 97/49, pulse 63, temperature 97.8 F (36.6 C), temperature source Oral, resp. rate 16, height 5\' 8"  (1.727 m), weight 66.1 kg, SpO2 90 %.  Examination: General appearance: alert, cooperative and no distress Head: Normocephalic, without obvious abnormality, atraumatic Eyes: EOMI Lungs: clear to auscultation bilaterally Heart: regular rate and rhythm and S1, S2 normal Abdomen: normal findings: bowel sounds normal and soft, non-tender Extremities: no edema Skin: mobility and turgor normal Neurologic: Grossly normal   Consultants:   Heme/onc  Data Reviewed: I have personally reviewed following labs and imaging studies Results for orders placed or performed during the hospital encounter of 06/23/20 (from the past 24 hour(s))  CBC     Status: Abnormal   Collection Time: 07/22/20  3:54 AM  Result Value Ref Range   WBC 9.6 4.0 - 10.5 K/uL   RBC 2.95 (L) 3.87 - 5.11 MIL/uL   Hemoglobin 9.9 (L) 12.0 - 15.0 g/dL   HCT 30.3 (L) 36 - 46 %   MCV 102.7 (H) 80.0 - 100.0 fL   MCH 33.6 26.0 - 34.0 pg   MCHC 32.7 30.0 - 36.0 g/dL   RDW 15.2 11.5 - 15.5 %   Platelets 206 150 - 400 K/uL   nRBC 0.0 0.0 - 0.2 %  Comprehensive metabolic panel     Status: Abnormal   Collection Time: 07/22/20  3:54 AM  Result Value Ref Range   Sodium 138 135 - 145 mmol/L   Potassium 3.4 (L) 3.5 - 5.1 mmol/L   Chloride 98 98 - 111 mmol/L   CO2 29 22 - 32 mmol/L   Glucose, Bld 91 70 - 99 mg/dL   BUN 20 8 - 23 mg/dL   Creatinine, Ser 1.48 (H) 0.44 - 1.00 mg/dL   Calcium 8.0 (L) 8.9 - 10.3 mg/dL   Total Protein 5.2 (L) 6.5 - 8.1 g/dL   Albumin 2.7 (L) 3.5 - 5.0 g/dL   AST 42 (H) 15 - 41 U/L   ALT 24 0 - 44 U/L   Alkaline Phosphatase  132 (H) 38 - 126 U/L   Total Bilirubin 0.8 0.3 - 1.2 mg/dL   GFR calc non Af Amer 32 (L) >60 mL/min   GFR calc Af Amer 38 (L) >60 mL/min   Anion gap 11 5 - 15  Magnesium     Status: None   Collection Time: 07/22/20  3:54 AM  Result Value Ref Range   Magnesium 2.2 1.7 - 2.4 mg/dL  Phosphorus     Status: None   Collection Time: 07/22/20  3:54 AM  Result Value Ref Range   Phosphorus 4.3 2.5 - 4.6 mg/dL    Recent Results (from the past 240 hour(s))  Body fluid culture (includes gram stain)     Status: None   Collection Time: 07/17/20  5:23 PM   Specimen: Pleural Fluid  Result Value Ref Range Status   Specimen Description   Final    PLEURAL Performed at [facility], [address], [place], [place] [address]    Special Requests   Final    NONE Performed at [facility], [place] [address] [place], [place] [address]    Gram Stain   Final    MODERATE WBC PRESENT, PREDOMINANTLY MONONUCLEAR NO ORGANISMS SEEN  Culture   Final    NO GROWTH 3 DAYS Performed at [facility], [place] [address] [place], [place] [address]    Report Status 07/21/2020 FINAL  Final     Radiology Studies: No results found. DG CHEST PORT 1 VIEW  Final Result    DG CHEST PORT 1 VIEW  Final Result    DG Chest 2 View  Final Result    DG Chest 2 View  Final Result    US THORACENTESIS ASP PLEURAL SPACE W/IMG GUIDE  Final Result    DG Chest 1 View  Final Result    CT ANGIO CHEST PE W OR WO CONTRAST  Final Result    DG Chest 2 View  Final Result    DG Chest 1 View  Final Result    US THORACENTESIS ASP PLEURAL SPACE W/IMG GUIDE  Final Result    DG Chest 2 View  Final Result    US Abdomen Limited RUQ  Final Result    [place] ASCITES (ABDOMEN LIMITED)  Final Result    DG Chest 2 View  Final Result    CT Abdomen Pelvis W Contrast  Final Result       Scheduled Meds:  acalabrutinib  100 mg Oral QHS   Chlorhexidine Gluconate Cloth  6 each  Topical Daily   citalopram  20 mg Oral Daily   diltiazem  180 mg Oral QHS   furosemide  20 mg Intravenous BID   heparin lock flush  500 Units Intracatheter Q30 days   levothyroxine  224 mcg Oral Q0600   melatonin  3 mg Oral QHS   metoprolol tartrate  12.5 mg Oral BID   multivitamin with minerals  1 tablet Oral Daily   pantoprazole  40 mg Oral Daily   sulfamethoxazole-trimethoprim  2 tablet Oral Q12H   PRN Meds: sodium chloride, acetaminophen **OR** acetaminophen, albuterol, ALPRAZolam, alum & mag hydroxide-simeth, heparin lock flush **AND** heparin lock flush, loperamide, ondansetron **OR** ondansetron (ZOFRAN) IV, sodium chloride flush Continuous Infusions:  sodium chloride 10 mL/hr at 07/21/20 0000      LOS: 28 days  Time spent: Greater than 50% of the 35 minute visit was spent in counseling/coordination of care for the patient as laid out in the A&P.   Dwyane Dee, MD [facility] 07/22/2020, 6:04 PM   Contact via secure chat.  To contact the attending provider between 7A-7P or the covering provider during after hours 7P-7A, please log into the web site [email or web address] and access using universal [place] password for that web site. If you do not have the password, please call the hospital operator.

## 2020-07-22 NOTE — Telephone Encounter (Signed)
Scheduled appt per 8/2 sch msg - pt to get an updated schedule with discharge papers

## 2020-07-22 NOTE — Plan of Care (Signed)
  Problem: Education: Goal: Knowledge of General Education information will improve Description: Including pain rating scale, medication(s)/side effects and non-pharmacologic comfort measures Outcome: Progressing   Problem: Clinical Measurements: Goal: Will remain free from infection Outcome: Progressing   

## 2020-07-22 NOTE — TOC Progression Note (Signed)
Transition of Care [facility]) - Progression Note    Patient Details  Name: Tracy Mclaughlin MRN: [id number] Date of Birth: October 06, 1934  Transition of Care [facility]) CM/SW Contact  Ross Ludwig, [place] Phone Number: 07/22/2020, 6:11 PM  Clinical Narrative:    CSW spoke to [name] at [facility] and she said as long as patient's husband is able to provide patient's chemo medication, they can accept patient pending insurance authorization.  CSW faxed updated therapy notes and signed FL2 to Wells Guiles at [phone], she will start insurance authorization.  Per Wells Guiles patient will need a new Covid test within three days of discharging to SNF.  Patient goes to the [facility] and sees Dr. Benay Spice.  CSW contacted patient's husband and updated him on what [facility] SNF said.  CSW to continue to facilitate discharge planning.  Expected Discharge Plan: Home/Self Care Barriers to Discharge: Continued Medical Work up  Expected Discharge Plan and Services Expected Discharge Plan: Home/Self Care   Discharge Planning Services: CM Consult   Living arrangements for the past 2 months: Single Family Home                                       Social Determinants of Health (SDOH) Interventions    Readmission Risk Interventions No flowsheet data found.

## 2020-07-23 DIAGNOSIS — N179 Acute kidney failure, unspecified: Secondary | ICD-10-CM

## 2020-07-23 LAB — CBC WITH DIFFERENTIAL/PLATELET
Abs Immature Granulocytes: 0.19 10*3/uL — ABNORMAL HIGH (ref 0.00–0.07)
Basophils Absolute: 0 10*3/uL (ref 0.0–0.1)
Basophils Relative: 1 %
Eosinophils Absolute: 0.1 10*3/uL (ref 0.0–0.5)
Eosinophils Relative: 1 %
HCT: 29.4 % — ABNORMAL LOW (ref 36.0–46.0)
Hemoglobin: 9.8 g/dL — ABNORMAL LOW (ref 12.0–15.0)
Immature Granulocytes: 2 %
Lymphocytes Relative: 38 %
Lymphs Abs: 3.2 10*3/uL (ref 0.7–4.0)
MCH: 34.4 pg — ABNORMAL HIGH (ref 26.0–34.0)
MCHC: 33.3 g/dL (ref 30.0–36.0)
MCV: 103.2 fL — ABNORMAL HIGH (ref 80.0–100.0)
Monocytes Absolute: 1 10*3/uL (ref 0.1–1.0)
Monocytes Relative: 12 %
Neutro Abs: 3.8 10*3/uL (ref 1.7–7.7)
Neutrophils Relative %: 46 %
Platelets: 181 10*3/uL (ref 150–400)
RBC: 2.85 MIL/uL — ABNORMAL LOW (ref 3.87–5.11)
RDW: 15.2 % (ref 11.5–15.5)
WBC: 8.2 10*3/uL (ref 4.0–10.5)
nRBC: 0 % (ref 0.0–0.2)

## 2020-07-23 LAB — BASIC METABOLIC PANEL
Anion gap: 12 (ref 5–15)
BUN: 21 mg/dL (ref 8–23)
CO2: 27 mmol/L (ref 22–32)
Calcium: 7.8 mg/dL — ABNORMAL LOW (ref 8.9–10.3)
Chloride: 98 mmol/L (ref 98–111)
Creatinine, Ser: 1.55 mg/dL — ABNORMAL HIGH (ref 0.44–1.00)
GFR calc Af Amer: 36 mL/min — ABNORMAL LOW (ref >60)
GFR calc non Af Amer: 31 mL/min — ABNORMAL LOW (ref >60)
Glucose, Bld: 97 mg/dL (ref 70–99)
Potassium: 3.6 mmol/L (ref 3.5–5.1)
Sodium: 137 mmol/L (ref 135–145)

## 2020-07-23 LAB — MAGNESIUM: Magnesium: 2.3 mg/dL (ref 1.7–2.4)

## 2020-07-23 MED ORDER — SODIUM CHLORIDE 0.9 % IV SOLN: INTRAVENOUS | Status: DC

## 2020-07-23 NOTE — Care Management Important Message (Signed)
Important Message  Patient Details IM Letter given to the Patient Name: Tracy Mclaughlin MRN: [id number] Date of Birth: [birth year]   Medicare Important Message Given:  Yes     Kerin Salen 07/23/2020, 9:22 AM

## 2020-07-23 NOTE — Progress Notes (Signed)
Held cardizem and metoprolol due to soft BP. Paged on-call provider. Will continue to monitor.

## 2020-07-23 NOTE — Progress Notes (Signed)
PROGRESS NOTE    Tracy Mclaughlin   ZJI:[id number]  DOB: June 05, 1934  DOA: 06/23/2020     31  PCP: [name], PA  CC: diarrhea, weakness   Hospital Course: [name] is an 86 y.o. 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 [name], hypothyroidism, history of A. fib, pancytopenia presents to the ED with 3-4 day history of nausea vomiting and diarrhea and abdominal pain-and right upper quadrant pain. symptoms started after new chemo on Thursday.Patient denies any fever,  chest pain. vomitted x 3 today and had non bloody diarrhea 4 times today. She was hemodynamically stable, afebrile, UA positive for UTI, CT scan abdomen done that showed free fluid with ascites and mesenteric edema, case was discussed Dr. [name] from oncology,  advised UTI treatment with Zosyn . Patient  was admitted, being managed symptomatically, chemo was held,  diarrhea improved subsequently,  oncology put the patient back on chemo overall tolerating well. Having nausea anorexia suspecting this is related to her lymphoma. Patient has been started on prednisone to help with appetite and also received trazodone for sleep. Patient remains deconditioned.   Patient was also noticed to have shortness of breath/dyspnea with activity-imaging studies showed pleural effusion underwent right-sided thoracentesis x 2  with 1.6 L of blood-tinged fluid removal, cytology reactive mesothelial celss, no growth on culture. Patient went into rapid tachycardia supraventricular 7/19 a.m. and also with hypoxia-underwent CTA chest : No PE but right-sided pleural effusion atelectasis/consolidation-cardiologist consulted.  Patient converted to sinus rhythm spontaneously. received few days of IV antibiotics , Procalcitonin has been negative/normal- no fever no leukocytosis. Patient continues to remain hypoxic and in  respiratory failure, chest x-ray shows reaccumulation of the right pleural effusion.  Pulmonology consulted for repeat thoracocentesis or potentially Pleurx catheter. She underwent thoracocentesis 1400 cc of fluid removed sent for cytology flow cytometry. Oncology resumed acalabrutinib plan if she does not improve would recommend hospice.   After multiple conversations patient finally agreed for skilled nursing facility awaiting insurance authorization.   Interval History:  No events overnight. Resting in bed; sleepy this am. Informed her we would restart IVF today for her kidneys and monitor response overnight; she was okay with this.   Old records reviewed in assessment of this patient  ROS: Constitutional: positive for fatigue, Respiratory: negative for cough, Cardiovascular: negative for chest pain and Gastrointestinal: negative for abdominal pain  Assessment & Plan: Principal Problem:   Recurrent pleural effusion on right Active Problems:   Non Hodgkin's lymphoma (HCC)   Hypertension   Anemia, iron deficiency   PAF (paroxysmal atrial fibrillation) (HCC)   Anemia   Pancytopenia (HCC)   Nausea & vomiting   UTI (urinary tract infection)   Ascites   Acute respiratory failure with hypoxia and hypercapnia (HCC)  Acute hypoxic respiratory failure due to pleural effusion / No PE on the CT. - Stable, still requiring 1-2 L of oxygen, to maintain O2 sat greater than 92% - Underwent thoracocentesis twice. - Pleural cultures grew gram-positive cocci. - initiated on  broad-spectrum antibiotics  Vanco and Zosyn . - narrowed down antibiotics according to final cultures and sensitivity. - Pleural fluid grew staph epi, continued with vancomycin and DC Zosyn. - As needed duo nebs  - Low suspicion for pneumonia, sepsis. - Pulmonology consulted for repeat thoracocentesis or potentially Pleurx catheter. - She underwent thoracocentesis, 1400 cc fluid drained, sent for flow cytometry, cytology  and other studies. - Cultures sensitive to Bactrim , antibiotics changed. Plan is to complete a 14 day course of Bactrim, end date 07/23/20 -Patient is very deconditioned, encouraged out of bed to chair.  Intractable nausea vomitingdiarrhea andabdominal pain:-Resolved - Unclear etiology suspecting due to [place] held on admission and resumed 7/8. Diarrhea improved. Acalabrutinib was again discontinued as her LFTs were worsening. Has new onset ascites on the CT scan-but too small to tap on the ultrasound . Cont supportive measure.  - Prednisone was added to stimulate appetite but discontinued due to confusion.  Recurrent pleural effusion: -LikelyfromLymphoma Vsinfection or 2/2 Acalabrutinib ( less likely per oncology) . S/p rt thoracentesis 1.6 L blood-tinged fluid was removed. Pleural fluid Gram stain no organism, culture no growth so far, cytology:  reactive mesothelial cell.  -CTA shows moderate right and mild to moderate left pleural effusions-BNP around 400  -Echo shows preserved EJF 60 to 65%. no diastolic dysfunction, unclear if related to CHF versus low Albumin.   AKI -Creatinine has been slowly uptrending over the past couple days.  Possibly overdiuresis in setting of IV Lasix -Hold Lasix for now -Restart on IV fluids and follow-up BMP in a.m.  Narrow complex tachycardia->>>Resolved.  Converted to sinus rhythm. No recurrent episode -remained stable- Cardiology following  -Continue diltiazem CD 180 nightly, Lopressor 12.5 mg p.o. twice daily, -Following thyroid function -2D echocardiogram reviewed.  Ascites: Stable. -new onset ascites on the CT scan-too small to tap on paracentesis per IR.  Abnormal LFTS: - Improving 2/2 Acalabrutinib-which is discontinued.  LFTs improving. RUQ [place]- possible cirrhotic changes, hepatitis panel negative.  Acalabrutinib resumed, continue to monitor liver enzymes  Acute metabolic encephalopathy from  polypharmacy: >>> Resolved -Mentation back to baseline. had confusion/Lethargy: 7/14 a.m likley polypharmacy and pt feels improved after stopping trazodone/prednisone.  -Continue to complain of insomnia, sleeping medication as needed She is on Xanax low-dose continue for anxiety. Continue supportive care.   E coli UTI POA: Completed 7 days of antibiotics.   Non Hodgkin's lymphoma -Remained stable, -Oncology following -with progressive decline, patientwasstartedpast weekonAcalabrutinib she did not have significant response to chemotherapy last year.Acalabrutinibwas held on admission and resumed but again stopped due to worsening LFTs by oncology.  Oncology resumed acalabrutinib plan if she does not improve would recommend hospice. Monitor liver enzymes while being on acalabrutinib.  Failure to thrive/deconditioning: Remains weak and deconditioned. -PT continues to recommend SNF -She refused SNF. She is high risk for readmission. Husband concerned about poor support. -Patient seems not interested in physical therapy.  Encouraged to get out of bed to chair and participate in therapy. -After multiple conversations patient has finally agreed for skilled nursing facility. Case manager notified,  authorization in process  Hypertension:BP is controlled. Continue Cardizem.   Pancytopenia with anemia/Thrombocytopenia in the setting of lymphoma/chemo: -Hemoglobin and WBC count overall stable. Monitor.  Heparin was discontinued by hematology due to bilateral lower leg bruise and thrombocytopenia.   History of intermittent atrial fibrillation on Cardizem: -Not on anticoagulation due to history of subarachnoid hemorrhage as per cardiology. Bruise on the skin heparin discontinued per oncology. improving.  DVT prophylaxis: SCD Code Status: DNR Family Communication: none present Disposition Plan:  Status is: Inpatient  Remains inpatient appropriate because:Unsafe  d/c plan, IV treatments appropriate due to intensity of illness or inability to take PO and Inpatient level of care appropriate due to severity of illness   Dispo: The patient is from: Home  Anticipated d/c is to: SNF              Anticipated d/c date is: 2 days              Patient currently is not medically stable to d/c.   Objective: Blood pressure (!) 106/38, pulse 77, temperature 98.5 F (36.9 C), temperature source Oral, resp. rate 20, height 5\' 8"  (1.727 m), weight 63.7 kg, SpO2 94 %.  Examination: General appearance: alert, cooperative and no distress Head: Normocephalic, without obvious abnormality, atraumatic Eyes: EOMI Lungs: clear to auscultation bilaterally Heart: regular rate and rhythm and S1, S2 normal Abdomen: normal findings: bowel sounds normal and soft, non-tender Extremities: no edema Skin: mobility and turgor normal Neurologic: Grossly normal   Consultants:   Heme/onc  Data Reviewed: I have personally reviewed following labs and imaging studies Results for orders placed or performed during the hospital encounter of 06/23/20 (from the past 24 hour(s))  Basic metabolic panel     Status: Abnormal   Collection Time: 07/23/20  2:48 AM  Result Value Ref Range   Sodium 137 135 - 145 mmol/L   Potassium 3.6 3.5 - 5.1 mmol/L   Chloride 98 98 - 111 mmol/L   CO2 27 22 - 32 mmol/L   Glucose, Bld 97 70 - 99 mg/dL   BUN 21 8 - 23 mg/dL   Creatinine, Ser 1.55 (H) 0.44 - 1.00 mg/dL   Calcium 7.8 (L) 8.9 - 10.3 mg/dL   GFR calc non Af Amer 31 (L) >60 mL/min   GFR calc Af Amer 36 (L) >60 mL/min   Anion gap 12 5 - 15  CBC with Differential/Platelet     Status: Abnormal   Collection Time: 07/23/20  2:48 AM  Result Value Ref Range   WBC 8.2 4.0 - 10.5 K/uL   RBC 2.85 (L) 3.87 - 5.11 MIL/uL   Hemoglobin 9.8 (L) 12.0 - 15.0 g/dL   HCT 29.4 (L) 36 - 46 %   MCV 103.2 (H) 80.0 - 100.0 fL   MCH 34.4 (H) 26.0 - 34.0 pg   MCHC 33.3 30.0 - 36.0 g/dL   RDW 15.2  11.5 - 15.5 %   Platelets 181 150 - 400 K/uL   nRBC 0.0 0.0 - 0.2 %   Neutrophils Relative % 46 %   Neutro Abs 3.8 1.7 - 7.7 K/uL   Lymphocytes Relative 38 %   Lymphs Abs 3.2 0.7 - 4.0 K/uL   Monocytes Relative 12 %   Monocytes Absolute 1.0 0 - 1 K/uL   Eosinophils Relative 1 %   Eosinophils Absolute 0.1 0 - 0 K/uL   Basophils Relative 1 %   Basophils Absolute 0.0 0 - 0 K/uL   Immature Granulocytes 2 %   Abs Immature Granulocytes 0.19 (H) 0.00 - 0.07 K/uL  Magnesium     Status: None   Collection Time: 07/23/20  2:48 AM  Result Value Ref Range   Magnesium 2.3 1.7 - 2.4 mg/dL    Recent Results (from the past 240 hour(s))  Body fluid culture (includes gram stain)     Status: None   Collection Time: 07/17/20  5:23 PM   Specimen: Pleural Fluid  Result Value Ref Range Status   Specimen Description   Final    PLEURAL Performed at [facility], [address], [place], [place] [address]    Special Requests   Final    NONE Performed at [facility], [address] [place], [place] [address]    Gram Stain   Final    MODERATE WBC PRESENT, PREDOMINANTLY MONONUCLEAR NO ORGANISMS SEEN    Culture   Final    NO GROWTH 3 DAYS Performed at [facility] [address] [place], [place] [address]    Report Status 07/21/2020 FINAL  Final     Radiology Studies: No results found. DG CHEST PORT 1 VIEW  Final Result    DG CHEST PORT 1 VIEW  Final Result    DG Chest 2 View  Final Result    DG Chest 2 View  Final Result    US THORACENTESIS ASP PLEURAL SPACE W/IMG GUIDE  Final Result    DG Chest 1 View  Final Result    CT ANGIO CHEST PE W OR WO CONTRAST  Final Result    DG Chest 2 View  Final Result    DG Chest 1 View  Final Result    US THORACENTESIS ASP PLEURAL SPACE W/IMG GUIDE  Final Result    DG Chest 2 View  Final Result    US Abdomen Limited RUQ  Final Result    [place] ASCITES (ABDOMEN LIMITED)  Final Result      DG Chest 2 View  Final Result    CT Abdomen Pelvis W Contrast  Final Result       Scheduled Meds: . acalabrutinib  100 mg Oral QHS  . Chlorhexidine Gluconate Cloth  6 each Topical Daily  . citalopram  20 mg Oral Daily  . heparin lock flush  500 Units Intracatheter Q30 days  . levothyroxine  224 mcg Oral Q0600  . melatonin  3 mg Oral QHS  . multivitamin with minerals  1 tablet Oral Daily  . pantoprazole  40 mg Oral Daily  . sulfamethoxazole-trimethoprim  2 tablet Oral Q12H   PRN Meds: sodium chloride, acetaminophen **OR** acetaminophen, albuterol, ALPRAZolam, alum & mag hydroxide-simeth, heparin lock flush **AND** heparin lock flush, loperamide, ondansetron **OR** ondansetron (ZOFRAN) IV, sodium chloride flush Continuous Infusions: . sodium chloride 10 mL/hr at 07/21/20 0000  . sodium chloride 75 mL/hr at 07/23/20 1001      LOS: 29 days  Time spent: Greater than 50% of the 35 minute visit was spent in counseling/coordination of care for the patient as laid out in the A&P.   Dwyane Dee, MD [facility] 07/23/2020, 3:32 PM   Contact via secure chat.  To contact the attending provider between 7A-7P or the covering provider during after hours 7P-7A, please log into the web site [email or web address] and access using universal [place] password for that web site. If you do not have the password, please call the hospital operator.

## 2020-07-23 NOTE — Plan of Care (Signed)
  Problem: Education: Goal: Knowledge of General Education information will improve Description Including pain rating scale, medication(s)/side effects and non-pharmacologic comfort measures Outcome: Progressing   

## 2020-07-24 ENCOUNTER — Other Ambulatory Visit: Payer: Self-pay | Admitting: Nurse Practitioner

## 2020-07-24 DIAGNOSIS — C8590 Non-Hodgkin lymphoma, unspecified, unspecified site: Secondary | ICD-10-CM

## 2020-07-24 LAB — BASIC METABOLIC PANEL
Anion gap: 6 (ref 5–15)
BUN: 15 mg/dL (ref 8–23)
CO2: 27 mmol/L (ref 22–32)
Calcium: 7.8 mg/dL — ABNORMAL LOW (ref 8.9–10.3)
Chloride: 100 mmol/L (ref 98–111)
Creatinine, Ser: 1.06 mg/dL — ABNORMAL HIGH (ref 0.44–1.00)
GFR calc Af Amer: 56 mL/min — ABNORMAL LOW (ref >60)
GFR calc non Af Amer: 49 mL/min — ABNORMAL LOW (ref >60)
Glucose, Bld: 90 mg/dL (ref 70–99)
Potassium: 3.4 mmol/L — ABNORMAL LOW (ref 3.5–5.1)
Sodium: 133 mmol/L — ABNORMAL LOW (ref 135–145)

## 2020-07-24 LAB — CBC WITH DIFFERENTIAL/PLATELET
Abs Immature Granulocytes: 0.2 10*3/uL — ABNORMAL HIGH (ref 0.00–0.07)
Basophils Absolute: 0.1 10*3/uL (ref 0.0–0.1)
Basophils Relative: 1 %
Eosinophils Absolute: 0.1 10*3/uL (ref 0.0–0.5)
Eosinophils Relative: 1 %
HCT: 28.5 % — ABNORMAL LOW (ref 36.0–46.0)
Hemoglobin: 9.4 g/dL — ABNORMAL LOW (ref 12.0–15.0)
Immature Granulocytes: 3 %
Lymphocytes Relative: 44 %
Lymphs Abs: 3.1 10*3/uL (ref 0.7–4.0)
MCH: 33.9 pg (ref 26.0–34.0)
MCHC: 33 g/dL (ref 30.0–36.0)
MCV: 102.9 fL — ABNORMAL HIGH (ref 80.0–100.0)
Monocytes Absolute: 0.6 10*3/uL (ref 0.1–1.0)
Monocytes Relative: 8 %
Neutro Abs: 3.1 10*3/uL (ref 1.7–7.7)
Neutrophils Relative %: 43 %
Platelets: 186 10*3/uL (ref 150–400)
RBC: 2.77 MIL/uL — ABNORMAL LOW (ref 3.87–5.11)
RDW: 15.1 % (ref 11.5–15.5)
WBC: 7.2 10*3/uL (ref 4.0–10.5)
nRBC: 0 % (ref 0.0–0.2)

## 2020-07-24 LAB — MISC LABCORP TEST (SEND OUT): Labcorp test code: 480260

## 2020-07-24 LAB — HEPATIC FUNCTION PANEL
ALT: 25 U/L (ref 0–44)
AST: 41 U/L (ref 15–41)
Albumin: 2.4 g/dL — ABNORMAL LOW (ref 3.5–5.0)
Alkaline Phosphatase: 123 U/L (ref 38–126)
Bilirubin, Direct: 0.1 mg/dL (ref 0.0–0.2)
Indirect Bilirubin: 0.6 mg/dL (ref 0.3–0.9)
Total Bilirubin: 0.7 mg/dL (ref 0.3–1.2)
Total Protein: 4.9 g/dL — ABNORMAL LOW (ref 6.5–8.1)

## 2020-07-24 LAB — MAGNESIUM: Magnesium: 2.4 mg/dL (ref 1.7–2.4)

## 2020-07-24 MED ORDER — SODIUM CHLORIDE 0.9 % IV SOLN: INTRAVENOUS | Status: AC

## 2020-07-24 NOTE — Progress Notes (Signed)
Physical Therapy Treatment Patient Details Name: Tracy Mclaughlin MRN: [id number] DOB: 20-Dec-1934 Today's Date: 07/24/2020    History of Present Illness 86 yo female admitted with N/V/D, abd pain, UTI, ascites.  Pt diagnosed with Acute hypoxic respiratory failure due to pleural effusion. Hx of NHL, hypothyroidism, A fib, anemia, OA, PE, SAH.    PT Comments    The patient had been seated in recliner x 1 hour and was ready to return to bed. Declined  Offer to ambulate in room. Patient did stand from recliner with min guard.  Patient reports plans to go to rehab soon.  Follow Up Recommendations  SNF;Supervision/Assistance - 24 hour     Equipment Recommendations    none   Recommendations for Other Services       Precautions / Restrictions Precautions Precautions: Fall Precaution Comments: monitor O2,HR    Mobility  Bed Mobility Overal bed mobility: Needs Assistance Bed Mobility: Sit to Supine     Supine to sit: Min assist;+2 for safety/equipment Sit to supine: Min assist   General bed mobility comments: assist for LEs onto bed  Transfers Overall transfer level: Needs assistance Equipment used: Rolling walker (2 wheeled) Transfers: Sit to/from [facility] Sit to Stand: Min assist Stand pivot transfers: Min assist       General transfer comment: pt requesting to perform on her own, requires UE assist to push from recliner., Smal steps from recliner to bed. declined ambulation  Ambulation/Gait             General Gait Details: pt. declined   Stairs             Wheelchair Mobility    Modified Rankin (Stroke Patients Only)       Balance Overall balance assessment: Needs assistance Sitting-balance support: No upper extremity supported;Feet supported Sitting balance-Leahy Scale: Good     Standing balance support: Bilateral upper extremity supported Standing balance-Leahy Scale: Fair Standing balance comment: support of RW                             Cognition Arousal/Alertness: Awake/alert Behavior During Therapy: WFL for tasks assessed/performed Overall Cognitive Status: Within Functional Limits for tasks assessed                                 General Comments: patien reports going to rehab at some point      Exercises      General Comments        Pertinent Vitals/Pain Pain Assessment: No/denies pain    Home Living                      Prior Function            PT Goals (current goals can now be found in the care plan section) Acute Rehab PT Goals Patient Stated Goal: to go home at discharge Progress towards PT goals: Progressing toward goals    Frequency    Min 2X/week      PT Plan Current plan remains appropriate;Frequency needs to be updated    Co-evaluation              AM-PAC PT "6 Clicks" Mobility   Outcome Measure  Help needed turning from your back to your side while in a flat bed without using bedrails?: A Little Help needed moving from lying on your  back to sitting on the side of a flat bed without using bedrails?: A Little Help needed moving to and from a bed to a chair (including a wheelchair)?: A Little Help needed standing up from a chair using your arms (e.g., wheelchair or bedside chair)?: A Lot Help needed to walk in hospital room?: A Lot Help needed climbing 3-5 steps with a railing? : A Lot 6 Click Score: 15    End of Session Equipment Utilized During Treatment: Gait belt Activity Tolerance: Patient tolerated treatment well Patient left: in bed;with call bell/phone within reach Nurse Communication: Mobility status PT Visit Diagnosis: Muscle weakness (generalized) (M62.81);Difficulty in walking, not elsewhere classified (R26.2);Unsteadiness on feet (R26.81)     Time: 1030-1041 PT Time Calculation (min) (ACUTE ONLY): 11 min  Charges:  $Therapeutic Activity: 8-22 mins                     Tresa Endo PT Acute  Rehabilitation Services Pager [phone] Office [phone]    Tracy Mclaughlin 07/24/2020, 12:57 PM

## 2020-07-24 NOTE — Progress Notes (Addendum)
Occupational Therapy Treatment Patient Details Name: Tracy Mclaughlin MRN: [id number] DOB: [birth year] Today's Date: 07/24/2020    History of present illness 85 yo female admitted with N/V/D, abd pain, UTI, ascites.  Pt diagnosed with Acute hypoxic respiratory failure due to pleural effusion. Hx of NHL, hypothyroidism, A fib, anemia, OA, PE, SAH.   OT comments  Pt with good participation with OT this day  Follow Up Recommendations  Supervision/Assistance - 24 hour;Home health OT;SNF    Equipment Recommendations  Other (comment)    Recommendations for Other Services      Precautions / Restrictions Restrictions Weight Bearing Restrictions: No       Mobility Bed Mobility Overal bed mobility: Needs Assistance Bed Mobility: Sit to Supine     Supine to sit: Min assist;+2 for safety/equipment Sit to supine: Min assist;+2 for safety/equipment   General bed mobility comments: assist for LEs onto bed  Transfers Overall transfer level: Needs assistance Equipment used: Rolling walker (2 wheeled) Transfers: Sit to/from [facility] Sit to Stand: Min assist Stand pivot transfers: Min assist       General transfer comment: pt requesting to perform on her own, requires UE assist    Balance Overall balance assessment: Needs assistance Sitting-balance support: No upper extremity supported;Feet supported                                       ADL either performed or assessed with clinical judgement   ADL    Pt participated in grooming activity sitting EOB.  Pt sat EOB for oral care, hair care and washing hands. Pt was min A for these activities  Pt then agreed to get transfer to recliner.  See mobility                   Vision          Cognition Arousal/Alertness: Awake/alert Behavior During Therapy: WFL for tasks assessed/performed Overall Cognitive Status: Within Functional Limits for tasks assessed                                                      Pertinent Vitals/ Pain       Pain Assessment: No/denies pain         Frequency  Min 2X/week        Progress Toward Goals  OT Goals(current goals can now be found in the care plan section)  progressing  Plan Discharge plan remains appropriate       AM-PAC OT "6 Clicks" Daily Activity     Outcome Measure      End of Session Equipment Utilized During Treatment: Rolling walker;Oxygen     Activity Tolerance Patient tolerated treatment well   Patient Left in bed;with call bell/phone within reach;with bed alarm set   Nurse Communication Mobility status (discussed patient's mobility, participation, emotional status)        Time: 0539-7673 OT Time Calculation (min): 44 min  Charges: OT General Charges $OT Visit: 1 Visit OT Treatments $Self Care/Home Management : 38-52 mins  Tracy Mclaughlin, [place] [phone] Office- [phone]      Tracy Mclaughlin, Edwena Felty D 07/24/2020, 11:04 AM

## 2020-07-24 NOTE — Progress Notes (Signed)
PROGRESS NOTE    Tracy Mclaughlin   VZC:[id number]  DOB: 01/15/1934  DOA: 06/23/2020     30  PCP: [name], PA  CC: diarrhea, weakness   Hospital Course: Tracy Mclaughlin is an 86 y.o. 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 [name], hypothyroidism, history of A. fib, pancytopenia presents to the ED with 3-4 day history of nausea vomiting and diarrhea and abdominal pain-and right upper quadrant pain. symptoms started after new chemo on Thursday.Patient denies any fever,  chest pain. vomitted x 3 today and had non bloody diarrhea 4 times today. She was hemodynamically stable, afebrile, UA positive for UTI, CT scan abdomen done that showed free fluid with ascites and mesenteric edema, case was discussed Dr. [name] from oncology,  advised UTI treatment with Zosyn . Patient  was admitted, being managed symptomatically, chemo was held,  diarrhea improved subsequently,  oncology put the patient back on chemo overall tolerating well. Having nausea anorexia suspecting this is related to her lymphoma. Patient has been started on prednisone to help with appetite and also received trazodone for sleep. Patient remains deconditioned.   Patient was also noticed to have shortness of breath/dyspnea with activity-imaging studies showed pleural effusion underwent right-sided thoracentesis x 2  with 1.6 L of blood-tinged fluid removal, cytology reactive mesothelial celss, no growth on culture. Patient went into rapid tachycardia supraventricular 7/19 a.m. and also with hypoxia-underwent CTA chest : No PE but right-sided pleural effusion atelectasis/consolidation-cardiologist consulted.  Patient converted to sinus rhythm spontaneously. received few days of IV antibiotics , Procalcitonin has been negative/normal- no fever no leukocytosis. Patient continues to remain hypoxic and in  respiratory failure, chest x-ray shows reaccumulation of the right pleural effusion.  Pulmonology consulted for repeat thoracocentesis or potentially Pleurx catheter. She underwent thoracocentesis 1400 cc of fluid removed sent for cytology flow cytometry. Oncology resumed acalabrutinib plan if she does not improve would recommend hospice.   After multiple conversations patient finally agreed for skilled nursing facility awaiting insurance authorization.   Interval History:  No events overnight. Resting in bed comfortable.Understands plan is for SNF at discharge. She is hopeful for the place in [place] that she wants to go.   Old records reviewed in assessment of this patient  ROS: Constitutional: positive for fatigue, Respiratory: negative for cough, Cardiovascular: negative for chest pain and Gastrointestinal: negative for abdominal pain  Assessment & Plan: Principal Problem:   Recurrent pleural effusion on right Active Problems:   Non Hodgkin's lymphoma (HCC)   Hypertension   Anemia, iron deficiency   PAF (paroxysmal atrial fibrillation) (HCC)   Anemia   Pancytopenia (HCC)   Nausea & vomiting   UTI (urinary tract infection)   Ascites   Acute respiratory failure with hypoxia and hypercapnia (HCC)  Acute hypoxic respiratory failure due to pleural effusion / No PE on the CT. - Stable, still requiring 1-2 L of oxygen, to maintain O2 sat greater than 92% - Underwent thoracocentesis twice. - Pleural cultures grew gram-positive cocci. - initiated on  broad-spectrum antibiotics  Vanco and Zosyn . - narrowed down antibiotics according to final cultures and sensitivity. - Pleural fluid grew staph epi, continued with vancomycin and DC Zosyn. - As needed duo nebs  - Low suspicion for pneumonia, sepsis. - Pulmonology consulted for repeat thoracocentesis or potentially Pleurx catheter. - She underwent thoracocentesis, 1400 cc fluid drained, sent for flow cytometry, cytology and other  studies. - Cultures sensitive to Bactrim , antibiotics changed. Plan is to complete a 14 day course of Bactrim, end date 07/23/20 -Patient is very deconditioned, encouraged out of bed to chair.  Intractable nausea vomitingdiarrhea andabdominal pain:-Resolved - Unclear etiology suspecting due to [place] held on admission and resumed 7/8. Diarrhea improved. Acalabrutinib was again discontinued as her LFTs were worsening. Has new onset ascites on the CT scan-but too small to tap on the ultrasound . Cont supportive measure.  - Prednisone was added to stimulate appetite but discontinued due to confusion.  Recurrent pleural effusion: -LikelyfromLymphoma Vsinfection or 2/2 Acalabrutinib ( less likely per oncology) . S/p rt thoracentesis 1.6 L blood-tinged fluid was removed. Pleural fluid Gram stain no organism, culture no growth so far, cytology:  reactive mesothelial cell.  -CTA shows moderate right and mild to moderate left pleural effusions-BNP around 400  -Echo shows preserved EJF 60 to 65%. no diastolic dysfunction, unclear if related to CHF versus low Albumin.   AKI -Creatinine had been slowly uptrending.  Suspicion was prerenal and overdiuresis from Lasix. -Responded well to fluids overnight from 07/23/2020 to today -Decrease fluid rate and continue for another 24 hours  Narrow complex tachycardia->>>Resolved.  Converted to sinus rhythm. No recurrent episode -remained stable- Cardiology following  -Continue diltiazem CD 180 nightly, Lopressor 12.5 mg p.o. twice daily, -Following thyroid function -2D echocardiogram reviewed.  Ascites: Stable. -new onset ascites on the CT scan-too small to tap on paracentesis per IR.  Abnormal LFTS: - Improving 2/2 Acalabrutinib-which is discontinued.  LFTs improving. RUQ [place]- possible cirrhotic changes, hepatitis panel negative.  Acalabrutinib resumed, continue to monitor liver enzymes  Acute metabolic  encephalopathy from polypharmacy: >>> Resolved -Mentation back to baseline. had confusion/Lethargy: 7/14 a.m likley polypharmacy and pt feels improved after stopping trazodone/prednisone.  -Continue to complain of insomnia, sleeping medication as needed She is on Xanax low-dose continue for anxiety. Continue supportive care.   E coli UTI POA: Completed 7 days of antibiotics.   Non Hodgkin's lymphoma -Remained stable, -Oncology following -with progressive decline, patientwasstartedpast weekonAcalabrutinib she did not have significant response to chemotherapy last year.Acalabrutinibwas held on admission and resumed but again stopped due to worsening LFTs by oncology.  Oncology resumed acalabrutinib plan if she does not improve would recommend hospice. Monitor liver enzymes while being on acalabrutinib.  Failure to thrive/deconditioning: Remains weak and deconditioned. -PT continues to recommend SNF -She refused SNF. She is high risk for readmission. Husband concerned about poor support. -Patient seems not interested in physical therapy.  Encouraged to get out of bed to chair and participate in therapy. -After multiple conversations patient has finally agreed for skilled nursing facility. Case manager notified,  authorization in process  Hypertension:BP is controlled. Continue Cardizem.   Pancytopenia with anemia/Thrombocytopenia in the setting of lymphoma/chemo: -Hemoglobin and WBC count overall stable. Monitor.  Heparin was discontinued by hematology due to bilateral lower leg bruise and thrombocytopenia.   History of intermittent atrial fibrillation on Cardizem: -Not on anticoagulation due to history of subarachnoid hemorrhage as per cardiology. Bruise on the skin heparin discontinued per oncology. improving.  DVT prophylaxis: SCD Code Status: DNR Family Communication: none present Disposition Plan:  Status is: Inpatient  Remains inpatient  appropriate because:Unsafe d/c plan, IV treatments appropriate due to intensity of illness or inability to take PO and Inpatient level of care appropriate due to severity of illness   Dispo: The patient is from: Home  Anticipated d/c is to: SNF              Anticipated d/c date is: 2 days              Patient currently is not medically stable to d/c.   Objective: Blood pressure (!) 139/44, pulse 75, temperature 98.2 F (36.8 C), temperature source Oral, resp. rate 20, height 5\' 8"  (1.727 m), weight 64.8 kg, SpO2 94 %.  Examination: General appearance: alert, cooperative and no distress Head: Normocephalic, without obvious abnormality, atraumatic Eyes: EOMI Lungs: clear to auscultation bilaterally Heart: regular rate and rhythm and S1, S2 normal Abdomen: normal findings: bowel sounds normal and soft, non-tender Extremities: no edema Skin: mobility and turgor normal Neurologic: Grossly normal   Consultants:   Heme/onc  Data Reviewed: I have personally reviewed following labs and imaging studies Results for orders placed or performed during the hospital encounter of 06/23/20 (from the past 24 hour(s))  Basic metabolic panel     Status: Abnormal   Collection Time: 07/24/20  4:20 AM  Result Value Ref Range   Sodium 133 (L) 135 - 145 mmol/L   Potassium 3.4 (L) 3.5 - 5.1 mmol/L   Chloride 100 98 - 111 mmol/L   CO2 27 22 - 32 mmol/L   Glucose, Bld 90 70 - 99 mg/dL   BUN 15 8 - 23 mg/dL   Creatinine, Ser 1.06 (H) 0.44 - 1.00 mg/dL   Calcium 7.8 (L) 8.9 - 10.3 mg/dL   GFR calc non Af Amer 49 (L) >60 mL/min   GFR calc Af Amer 56 (L) >60 mL/min   Anion gap 6 5 - 15  CBC with Differential/Platelet     Status: Abnormal   Collection Time: 07/24/20  4:20 AM  Result Value Ref Range   WBC 7.2 4.0 - 10.5 K/uL   RBC 2.77 (L) 3.87 - 5.11 MIL/uL   Hemoglobin 9.4 (L) 12.0 - 15.0 g/dL   HCT 28.5 (L) 36 - 46 %   MCV 102.9 (H) 80.0 - 100.0 fL   MCH 33.9 26.0 - 34.0 pg   MCHC  33.0 30.0 - 36.0 g/dL   RDW 15.1 11.5 - 15.5 %   Platelets 186 150 - 400 K/uL   nRBC 0.0 0.0 - 0.2 %   Neutrophils Relative % 43 %   Neutro Abs 3.1 1.7 - 7.7 K/uL   Lymphocytes Relative 44 %   Lymphs Abs 3.1 0.7 - 4.0 K/uL   Monocytes Relative 8 %   Monocytes Absolute 0.6 0 - 1 K/uL   Eosinophils Relative 1 %   Eosinophils Absolute 0.1 0 - 0 K/uL   Basophils Relative 1 %   Basophils Absolute 0.1 0 - 0 K/uL   Immature Granulocytes 3 %   Abs Immature Granulocytes 0.20 (H) 0.00 - 0.07 K/uL  Magnesium     Status: None   Collection Time: 07/24/20  4:20 AM  Result Value Ref Range   Magnesium 2.4 1.7 - 2.4 mg/dL  Hepatic function panel     Status: Abnormal   Collection Time: 07/24/20  4:20 AM  Result Value Ref Range   Total Protein 4.9 (L) 6.5 - 8.1 g/dL   Albumin 2.4 (L) 3.5 - 5.0 g/dL   AST 41 15 - 41 U/L   ALT 25 0 - 44 U/L   Alkaline Phosphatase 123 38 - 126 U/L   Total Bilirubin 0.7 0.3 - 1.2 mg/dL   Bilirubin, Direct 0.1 0.0 - 0.2 mg/dL  Indirect Bilirubin 0.6 0.3 - 0.9 mg/dL    Recent Results (from the past 240 hour(s))  Body fluid culture (includes gram stain)     Status: None   Collection Time: 07/17/20  5:23 PM   Specimen: Pleural Fluid  Result Value Ref Range Status   Specimen Description   Final    PLEURAL Performed at [facility] [address], [place], [place] [address]    Special Requests   Final    NONE Performed at [facility], [place] [address] [place], [place] [address]    Gram Stain   Final    MODERATE WBC PRESENT, PREDOMINANTLY MONONUCLEAR NO ORGANISMS SEEN    Culture   Final    NO GROWTH 3 DAYS Performed at [facility] [address] [place],  [address]    Report Status 07/21/2020 FINAL  Final     Radiology Studies: No results found. DG CHEST PORT 1 VIEW  Final Result    DG CHEST PORT 1 VIEW  Final Result    DG Chest 2 View  Final Result    DG Chest 2 View  Final Result    US  THORACENTESIS ASP PLEURAL SPACE W/IMG GUIDE  Final Result    DG Chest 1 View  Final Result    CT ANGIO CHEST PE W OR WO CONTRAST  Final Result    DG Chest 2 View  Final Result    DG Chest 1 View  Final Result    US THORACENTESIS ASP PLEURAL SPACE W/IMG GUIDE  Final Result    DG Chest 2 View  Final Result    US Abdomen Limited RUQ  Final Result    [place] ASCITES (ABDOMEN LIMITED)  Final Result    DG Chest 2 View  Final Result    CT Abdomen Pelvis W Contrast  Final Result       Scheduled Meds: . acalabrutinib  100 mg Oral QHS  . Chlorhexidine Gluconate Cloth  6 each Topical Daily  . citalopram  20 mg Oral Daily  . heparin lock flush  500 Units Intracatheter Q30 days  . levothyroxine  224 mcg Oral Q0600  . melatonin  3 mg Oral QHS  . multivitamin with minerals  1 tablet Oral Daily  . pantoprazole  40 mg Oral Daily   PRN Meds: sodium chloride, acetaminophen **OR** acetaminophen, albuterol, ALPRAZolam, alum & mag hydroxide-simeth, heparin lock flush **AND** heparin lock flush, loperamide, ondansetron **OR** ondansetron (ZOFRAN) IV, sodium chloride flush Continuous Infusions: . sodium chloride 75 mL/hr at 07/23/20 0959  . sodium chloride 50 mL/hr at 07/24/20 1149      LOS: 30 days  Time spent: Greater than 50% of the 35 minute visit was spent in counseling/coordination of care for the patient as laid out in the A&P.   Dwyane Dee, MD [facility] 07/24/2020, 2:29 PM   Contact via secure chat.  To contact the attending provider between 7A-7P or the covering provider during after hours 7P-7A, please log into the web site [email or web address] and access using universal [place] password for that web site. If you do not have the password, please call the hospital operator.

## 2020-07-24 NOTE — TOC Progression Note (Addendum)
Transition of Care [facility]) - Progression Note    Patient Details  Name: Tracy Mclaughlin MRN: [id number] Date of Birth: 07/27/1934  [facility]) CM/SW Contact  Ross Ludwig, Blue Phone Number: 07/24/2020, 5:42 PM  Clinical Narrative:    CSW still waiting on insurance authorization.  CSW faxed updated therapy notes to Wells Guiles at [facility], [phone].  CSW contacted [facility] to see if they may be able to take patient to SNF since she is [place].  CSW faxed face sheet with demographics and insurance information to [facility] at [phone] to review.   Expected Discharge Plan: Home/Self Care Barriers to Discharge: Continued Medical Work up  Expected Discharge Plan and Services Expected Discharge Plan: Home/Self Care   Discharge Planning Services: CM Consult   Living arrangements for the past 2 months: Single Family Home                                       Social Determinants of Health (SDOH) Interventions    Readmission Risk Interventions No flowsheet data found.

## 2020-07-24 NOTE — Progress Notes (Signed)
The patient asked the RN to telephone her [place], [place] @ [phone]. Then Arlen stated that he would call the patient. Arlen did notify the patient.

## 2020-07-25 LAB — CBC WITH DIFFERENTIAL/PLATELET
Abs Immature Granulocytes: 0.2 10*3/uL — ABNORMAL HIGH (ref 0.00–0.07)
Basophils Absolute: 0 10*3/uL (ref 0.0–0.1)
Basophils Relative: 1 %
Eosinophils Absolute: 0.1 10*3/uL (ref 0.0–0.5)
Eosinophils Relative: 1 %
HCT: 28.2 % — ABNORMAL LOW (ref 36.0–46.0)
Hemoglobin: 9.3 g/dL — ABNORMAL LOW (ref 12.0–15.0)
Immature Granulocytes: 3 %
Lymphocytes Relative: 42 %
Lymphs Abs: 2.7 10*3/uL (ref 0.7–4.0)
MCH: 34.4 pg — ABNORMAL HIGH (ref 26.0–34.0)
MCHC: 33 g/dL (ref 30.0–36.0)
MCV: 104.4 fL — ABNORMAL HIGH (ref 80.0–100.0)
Monocytes Absolute: 0.5 10*3/uL (ref 0.1–1.0)
Monocytes Relative: 8 %
Neutro Abs: 3 10*3/uL (ref 1.7–7.7)
Neutrophils Relative %: 45 %
Platelets: 179 10*3/uL (ref 150–400)
RBC: 2.7 MIL/uL — ABNORMAL LOW (ref 3.87–5.11)
RDW: 15.3 % (ref 11.5–15.5)
WBC: 6.5 10*3/uL (ref 4.0–10.5)
nRBC: 0 % (ref 0.0–0.2)

## 2020-07-25 LAB — COMPREHENSIVE METABOLIC PANEL
ALT: 26 U/L (ref 0–44)
AST: 43 U/L — ABNORMAL HIGH (ref 15–41)
Albumin: 2.6 g/dL — ABNORMAL LOW (ref 3.5–5.0)
Alkaline Phosphatase: 121 U/L (ref 38–126)
Anion gap: 7 (ref 5–15)
BUN: 12 mg/dL (ref 8–23)
CO2: 24 mmol/L (ref 22–32)
Calcium: 7.4 mg/dL — ABNORMAL LOW (ref 8.9–10.3)
Chloride: 106 mmol/L (ref 98–111)
Creatinine, Ser: 0.9 mg/dL (ref 0.44–1.00)
GFR calc Af Amer: >60 mL/min (ref >60)
GFR calc non Af Amer: 59 mL/min — ABNORMAL LOW (ref >60)
Glucose, Bld: 86 mg/dL (ref 70–99)
Potassium: 3.6 mmol/L (ref 3.5–5.1)
Sodium: 137 mmol/L (ref 135–145)
Total Bilirubin: 0.5 mg/dL (ref 0.3–1.2)
Total Protein: 4.9 g/dL — ABNORMAL LOW (ref 6.5–8.1)

## 2020-07-25 LAB — MAGNESIUM: Magnesium: 2.2 mg/dL (ref 1.7–2.4)

## 2020-07-25 LAB — SARS CORONAVIRUS 2 BY RT PCR (HOSPITAL ORDER, PERFORMED IN ~~LOC~~ HOSPITAL LAB): SARS Coronavirus 2: NEGATIVE

## 2020-07-25 NOTE — TOC Transition Note (Addendum)
Transition of Care [facility]) - CM/SW Discharge Note   Patient Details  Name: Tracy Mclaughlin MRN: [id number] Date of Birth: 1934-04-13  Transition of Care [facility]) CM/SW Contact:  Ross Ludwig, LCSW Phone Number: 07/25/2020, 5:11 PM   Clinical Narrative:     CSW received phone call from [place] at [facility], and she said patient has been approved for SNF placement and they can accept her today.  CSW informed Wells Guiles that patient's Covid test came back negative and faxed results to SNF at [phone] and also included a hard copy in patient's chart.  Patient to be d/c'ed today to [facility] in [place].  Patient and family agreeable to plans will transport via ems RN to call report to [phone] - Room 213 A.  Patient updated her husband that she was leaving today, CSW attempted to update husband, but unable to leave a message.  Final next level of care: Skilled Nursing Facility Barriers to Discharge: Barriers Resolved   Patient Goals and CMS Choice Patient states their goals for this hospitalization and ongoing recovery are:: To go to SNF for short term rehab, then return back home. CMS Medicare.gov Compare Post Acute Care list provided to:: Patient Represenative (must comment) Choice offered to / list presented to : Spouse  Discharge Placement              Patient chooses bed at: [facility] Patient to be transferred to facility by: [facility] Name of family member notified: Patient's husband Arlen [phone] Patient and family notified of of transfer: 07/25/20  Discharge Plan and Services   Discharge Planning Services: CM Consult              DME Agency: NA       HH Arranged: NA          Social Determinants of Health (SDOH) Interventions     Readmission Risk Interventions No flowsheet data found.

## 2020-07-25 NOTE — Progress Notes (Signed)
Report was given to receiving facility RN, [facility] [phone]) - Room 213 A  RN was given my number in case she had any other questions regarding patient. Patients home meds from pharmacy were returned to patient and placed in her belongings bag. The receiving RN at  [facility] is aware of patient's home meds that will be coming with patient.

## 2020-07-25 NOTE — Progress Notes (Signed)
PROGRESS NOTE    Tracy Mclaughlin   WCH:[id number]  DOB: March 19, 1934  DOA: 06/23/2020     33  PCP: [name], PA  CC: diarrhea, weakness   Hospital Course: Tracy Mclaughlin is an 86 y.o. 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 [name], hypothyroidism, history of A. fib, pancytopenia presents to the ED with 3-4 day history of nausea vomiting and diarrhea and abdominal pain-and right upper quadrant pain. symptoms started after new chemo on Thursday.Patient denies any fever,  chest pain. vomitted x 3 today and had non bloody diarrhea 4 times today. She was hemodynamically stable, afebrile, UA positive for UTI, CT scan abdomen done that showed free fluid with ascites and mesenteric edema, case was discussed Dr. [name] from oncology,  advised UTI treatment with Zosyn . Patient  was admitted, being managed symptomatically, chemo was held,  diarrhea improved subsequently,  oncology put the patient back on chemo overall tolerating well. Having nausea anorexia suspecting this is related to her lymphoma. Patient has been started on prednisone to help with appetite and also received trazodone for sleep. Patient remains deconditioned.   Patient was also noticed to have shortness of breath/dyspnea with activity-imaging studies showed pleural effusion underwent right-sided thoracentesis x 2  with 1.6 L of blood-tinged fluid removal, cytology reactive mesothelial celss, no growth on culture. Patient went into rapid tachycardia supraventricular 7/19 a.m. and also with hypoxia-underwent CTA chest : No PE but right-sided pleural effusion atelectasis/consolidation-cardiologist consulted.  Patient converted to sinus rhythm spontaneously. received few days of IV antibiotics , Procalcitonin has been negative/normal- no fever no leukocytosis. Patient continues to remain hypoxic and in  respiratory failure, chest x-ray shows reaccumulation of the right pleural effusion.  Pulmonology consulted for repeat thoracocentesis or potentially Pleurx catheter. She underwent thoracocentesis 1400 cc of fluid removed sent for cytology flow cytometry. Oncology resumed acalabrutinib plan if she does not improve would recommend hospice.   After multiple conversations patient finally agreed for skilled nursing facility awaiting insurance authorization.   Interval History:  No events overnight. Resting in bed comfortable. Understands plan is for SNF at discharge.  Still awaiting insurance update. She is frustrated and ready to go.   Old records reviewed in assessment of this patient  ROS: Constitutional: positive for fatigue, Respiratory: negative for cough, Cardiovascular: negative for chest pain and Gastrointestinal: negative for abdominal pain  Assessment & Plan: Principal Problem:   Recurrent pleural effusion on right Active Problems:   Non Hodgkin's lymphoma (HCC)   Hypertension   Anemia, iron deficiency   PAF (paroxysmal atrial fibrillation) (HCC)   Anemia   Pancytopenia (HCC)   Nausea & vomiting   UTI (urinary tract infection)   Ascites   Acute respiratory failure with hypoxia and hypercapnia (HCC)  Acute hypoxic respiratory failure due to pleural effusion / No PE on the CT. - Stable, still requiring 1-2 L of oxygen, to maintain O2 sat greater than 92% - Underwent thoracocentesis twice. - Pleural cultures grew gram-positive cocci. - initiated on broad-spectrum antibiotics  Vanco and Zosyn . - narrowed down antibiotics according to final cultures and sensitivity. - Pleural fluid grew staph epi, continued with vancomycin and DC Zosyn. - As needed duo nebs  - Low suspicion for pneumonia, sepsis. - Pulmonology consulted for repeat thoracocentesis or potentially Pleurx catheter. - She underwent thoracocentesis, 1400 cc fluid drained, sent for flow cytometry, cytology and  other studies. -  Cultures sensitive to Bactrim , antibiotics changed. Plan is to complete a 14 day course of Bactrim, end date 07/23/20 -Patient is very deconditioned, encouraged out of bed to chair.  Intractable nausea vomitingdiarrhea andabdominal pain:-Resolved - Unclear etiology suspecting due to [name] held on admission and resumed 7/8. Diarrhea improved. Acalabrutinib was again discontinued as her LFTs were worsening. Has new onset ascites on the CT scan-but too small to tap on the ultrasound . Cont supportive measure.  - Prednisone was added to stimulate appetite but discontinued due to confusion.  Recurrent pleural effusion: -LikelyfromLymphoma Vsinfection or 2/2 Acalabrutinib ( less likely per oncology) . S/p rt thoracentesis 1.6 L blood-tinged fluid was removed. Pleural fluid Gram stain no organism, culture no growth so far, cytology:  reactive mesothelial cell.  -CTA shows moderate right and mild to moderate left pleural effusions-BNP around 400  -Echo shows preserved EJF 60 to 65%. no diastolic dysfunction, unclear if related to CHF versus low Albumin.   AKI -Creatinine had been slowly uptrending.  Suspicion was prerenal and overdiuresis from Lasix. -Responded well to fluids overnight from 07/23/2020  - okay to d/c IVF and monitor function again   Narrow complex tachycardia->>>Resolved.  Converted to sinus rhythm. No recurrent episode -remained stable- Cardiology following  -Continue diltiazem CD 180 nightly, Lopressor 12.5 mg p.o. twice daily, -Following thyroid function -2D echocardiogram reviewed.  Ascites: Stable. -new onset ascites on the CT scan-too small to tap on paracentesis per IR.  Abnormal LFTS: - Improving 2/2 Acalabrutinib-which is discontinued.  LFTs improving. RUQ [place]- possible cirrhotic changes, hepatitis panel negative.  Acalabrutinib resumed, continue to monitor liver enzymes  Acute metabolic encephalopathy from  polypharmacy: >>> Resolved -Mentation back to baseline. had confusion/Lethargy: 7/14 a.m likley polypharmacy and pt feels improved after stopping trazodone/prednisone.  -Continue to complain of insomnia, sleeping medication as needed She is on Xanax low-dose continue for anxiety. Continue supportive care.   E coli UTI POA: Completed 7 days of antibiotics.   Non Hodgkin's lymphoma -Remained stable, -Oncology following -with progressive decline, patientwasstartedpast weekonAcalabrutinib she did not have significant response to chemotherapy last year.Acalabrutinibwas held on admission and resumed but again stopped due to worsening LFTs by oncology.  Oncology resumed acalabrutinib plan if she does not improve would recommend hospice. Monitor liver enzymes while being on acalabrutinib.  Failure to thrive/deconditioning: Remains weak and deconditioned. -PT continues to recommend SNF -She refused SNF. She is high risk for readmission. Husband concerned about poor support. -Patient seems not interested in physical therapy.  Encouraged to get out of bed to chair and participate in therapy. -After multiple conversations patient has finally agreed for skilled nursing facility. Case manager notified,  authorization in process  Hypertension:BP is controlled. Continue Cardizem.   Pancytopenia with anemia/Thrombocytopenia in the setting of lymphoma/chemo: -Hemoglobin and WBC count overall stable. Monitor.  Heparin was discontinued by hematology due to bilateral lower leg bruise and thrombocytopenia.   History of intermittent atrial fibrillation on Cardizem: -Not on anticoagulation due to history of subarachnoid hemorrhage as per cardiology. Bruise on the skin heparin discontinued per oncology. improving.  DVT prophylaxis: SCD Code Status: DNR Family Communication: none present Disposition Plan:  Status is: Inpatient  Remains inpatient appropriate because:Unsafe  d/c plan, IV treatments appropriate due to intensity of illness or inability to take PO and Inpatient level of care appropriate due to severity of illness   Dispo: The patient is from: Home              Anticipated  d/c is to: SNF              Anticipated d/c date is: Awaiting insurance approval               Patient currently is not medically stable to d/c.   Objective: Blood pressure (!) 130/48, pulse 80, temperature 99.6 F (37.6 C), temperature source Oral, resp. rate 18, height 5\' 8"  (1.727 m), weight 62.6 kg, SpO2 94 %.  Examination: General appearance: alert, cooperative and no distress Head: Normocephalic, without obvious abnormality, atraumatic Eyes: EOMI Lungs: clear to auscultation bilaterally Heart: regular rate and rhythm and S1, S2 normal Abdomen: normal findings: bowel sounds normal and soft, non-tender Extremities: no edema Skin: mobility and turgor normal Neurologic: Grossly normal   Consultants:   Heme/onc  Data Reviewed: I have personally reviewed following labs and imaging studies Results for orders placed or performed during the hospital encounter of 06/23/20 (from the past 24 hour(s))  CBC with Differential/Platelet     Status: Abnormal   Collection Time: 07/25/20  3:45 AM  Result Value Ref Range   WBC 6.5 4.0 - 10.5 K/uL   RBC 2.70 (L) 3.87 - 5.11 MIL/uL   Hemoglobin 9.3 (L) 12.0 - 15.0 g/dL   HCT 28.2 (L) 36 - 46 %   MCV 104.4 (H) 80.0 - 100.0 fL   MCH 34.4 (H) 26.0 - 34.0 pg   MCHC 33.0 30.0 - 36.0 g/dL   RDW 15.3 11.5 - 15.5 %   Platelets 179 150 - 400 K/uL   nRBC 0.0 0.0 - 0.2 %   Neutrophils Relative % 45 %   Neutro Abs 3.0 1.7 - 7.7 K/uL   Lymphocytes Relative 42 %   Lymphs Abs 2.7 0.7 - 4.0 K/uL   Monocytes Relative 8 %   Monocytes Absolute 0.5 0 - 1 K/uL   Eosinophils Relative 1 %   Eosinophils Absolute 0.1 0 - 0 K/uL   Basophils Relative 1 %   Basophils Absolute 0.0 0 - 0 K/uL   Immature Granulocytes 3 %   Abs Immature Granulocytes  0.20 (H) 0.00 - 0.07 K/uL  Magnesium     Status: None   Collection Time: 07/25/20  3:45 AM  Result Value Ref Range   Magnesium 2.2 1.7 - 2.4 mg/dL  Comprehensive metabolic panel     Status: Abnormal   Collection Time: 07/25/20  3:45 AM  Result Value Ref Range   Sodium 137 135 - 145 mmol/L   Potassium 3.6 3.5 - 5.1 mmol/L   Chloride 106 98 - 111 mmol/L   CO2 24 22 - 32 mmol/L   Glucose, Bld 86 70 - 99 mg/dL   BUN 12 8 - 23 mg/dL   Creatinine, Ser 0.90 0.44 - 1.00 mg/dL   Calcium 7.4 (L) 8.9 - 10.3 mg/dL   Total Protein 4.9 (L) 6.5 - 8.1 g/dL   Albumin 2.6 (L) 3.5 - 5.0 g/dL   AST 43 (H) 15 - 41 U/L   ALT 26 0 - 44 U/L   Alkaline Phosphatase 121 38 - 126 U/L   Total Bilirubin 0.5 0.3 - 1.2 mg/dL   GFR calc non Af Amer 59 (L) >60 mL/min   GFR calc Af Amer >60 >60 mL/min   Anion gap 7 5 - 15    Recent Results (from the past 240 hour(s))  Body fluid culture (includes gram stain)     Status: None   Collection Time: 07/17/20  5:23 PM   Specimen:  Pleural Fluid  Result Value Ref Range Status   Specimen Description   Final    PLEURAL Performed at [facility] [address], [place], [place] [address]    Special Requests   Final    NONE Performed at [facility], [place] [address], [place], [place] [address]    Gram Stain   Final    MODERATE WBC PRESENT, PREDOMINANTLY MONONUCLEAR NO ORGANISMS SEEN    Culture   Final    NO GROWTH 3 DAYS Performed at [facility] [address] [place], [place] [address]    Report Status 07/21/2020 FINAL  Final     Radiology Studies: No results found. DG CHEST PORT 1 VIEW  Final Result    DG CHEST PORT 1 VIEW  Final Result    DG Chest 2 View  Final Result    DG Chest 2 View  Final Result    US THORACENTESIS ASP PLEURAL SPACE W/IMG GUIDE  Final Result    DG Chest 1 View  Final Result    CT ANGIO CHEST PE W OR WO CONTRAST  Final Result    DG Chest 2 View  Final Result    DG  Chest 1 View  Final Result    US THORACENTESIS ASP PLEURAL SPACE W/IMG GUIDE  Final Result    DG Chest 2 View  Final Result    US Abdomen Limited RUQ  Final Result    [place] ASCITES (ABDOMEN LIMITED)  Final Result    DG Chest 2 View  Final Result    CT Abdomen Pelvis W Contrast  Final Result       Scheduled Meds: . acalabrutinib  100 mg Oral QHS  . Chlorhexidine Gluconate Cloth  6 each Topical Daily  . citalopram  20 mg Oral Daily  . heparin lock flush  500 Units Intracatheter Q30 days  . levothyroxine  224 mcg Oral Q0600  . melatonin  3 mg Oral QHS  . multivitamin with minerals  1 tablet Oral Daily  . pantoprazole  40 mg Oral Daily   PRN Meds: sodium chloride, acetaminophen **OR** acetaminophen, albuterol, ALPRAZolam, alum & mag hydroxide-simeth, heparin lock flush **AND** heparin lock flush, loperamide, ondansetron **OR** ondansetron (ZOFRAN) IV, sodium chloride flush Continuous Infusions: . sodium chloride 75 mL/hr at 07/23/20 0959      LOS: 31 days  Time spent: Greater than 50% of the 35 minute visit was spent in counseling/coordination of care for the patient as laid out in the A&P.   Dwyane Dee, MD [facility] 07/25/2020, 2:17 PM   Contact via secure chat.  To contact the attending provider between 7A-7P or the covering provider during after hours 7P-7A, please log into the web site [email or web address] and access using universal [place] password for that web site. If you do not have the password, please call the hospital operator.

## 2020-07-25 NOTE — Discharge Summary (Signed)
Physician Discharge Summary  Tracy Mclaughlin SVX:[id number] DOB: 1934-09-20 DOA: 06/23/2020  PCP: [name], PA  Admit date: 06/23/2020 Discharge date: 07/25/2020  Admitted From: home Disposition:  SNF Discharging physician: Dwyane Dee, MD  Recommendations for Outpatient Follow-up:  1. Resume cardizem if HR or BP start to elevate; was held in hospital and did not require 2. If further physical decline, consider hospice evaluation    Patient discharged to SNF in Discharge Condition: fair CODE STATUS: DNR Diet recommendation:  Diet Orders (From admission, onward)    Start     Ordered   06/25/20 0807  Diet regular Room service appropriate? Yes; Fluid consistency: Thin  Diet effective now       Question Answer Comment  Room service appropriate? Yes   Fluid consistency: Thin      06/25/20 0808          Hospital Course: Tracy Mclaughlin is an 86 y.o. 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 [name], hypothyroidism, history of A. fib, pancytopenia presents to the ED with 3-4 day history of nausea vomiting and diarrhea and abdominal pain-and right upper quadrant pain. symptoms started after new chemo on Thursday.Patient denies any fever,  chest pain. vomitted x 3 today and had non bloody diarrhea 4 times today. She was hemodynamically stable, afebrile, UA positive for UTI, CT scan abdomen done that showed free fluid with ascites and mesenteric edema, case was discussed Dr. [name] from oncology,  advised UTI treatment with Zosyn . Patient  was admitted, being managed symptomatically, chemo was held,  diarrhea improved subsequently,  oncology put the patient back on chemo overall tolerating well. Having nausea anorexia suspecting this is related to her lymphoma. Patient has been started on prednisone to help with appetite and also received trazodone for sleep.  Patient remains deconditioned.   Patient was also noticed to have shortness of breath/dyspnea with activity-imaging studies showed pleural effusion underwent right-sided thoracentesis x 2  with 1.6 L of blood-tinged fluid removal, cytology reactive mesothelial celss, no growth on culture. Patient went into rapid tachycardia supraventricular 7/19 a.m. and also with hypoxia-underwent CTA chest : No PE but right-sided pleural effusion atelectasis/consolidation-cardiologist consulted.  Patient converted to sinus rhythm spontaneously. received few days of IV antibiotics , Procalcitonin has been negative/normal- no fever no leukocytosis. Patient continues to remain hypoxic and in respiratory failure, chest x-ray shows reaccumulation of the right pleural effusion.  Pulmonology consulted for repeat thoracocentesis or potentially Pleurx catheter. She underwent thoracocentesis 1400 cc of fluid removed sent for cytology flow cytometry. Oncology resumed acalabrutinib plan if she does not improve would recommend hospice.   After multiple conversations patient finally agreed for skilled nursing facility awaiting insurance authorization.  At discharge, her diltiazem was held due to soft/low blood pressures.  If she does develop elevated blood pressures at discharge, could consider reinitiation.  In setting of her A. fib, she was rate controlled while Cardizem was withheld during hospitalization.  Acute hypoxic respiratory failure due to pleural effusion / No PE on the CT. - Stable, still requiring1-2L of oxygen, to maintain O2 sat greater than 92% -Underwent thoracocentesis twice. - Pleural culturesgrewgram-positive cocci. -initiated onbroad-spectrum antibiotics Vanco and Zosyn . - narroweddown antibiotics according to final cultures and sensitivity. - Pleural fluid grew staph epi, continuedwith vancomycin and DC Zosyn. - As needed duo nebs  - Low suspicion for pneumonia, sepsis. - Pulmonology  consulted for  repeat thoracocentesis or potentially Pleurx catheter. - She underwent thoracocentesis, 1400 cc fluid drained, sent for flow cytometry, cytology and other studies. - Culturessensitive to Bactrim,antibiotics changed. Plan is to complete a 14 day course of Bactrim, end date 07/23/20 -Patient is very deconditioned,encouraged out of bed to chair.  Intractable nausea vomitingdiarrhea andabdominal pain:-Resolved - Unclear etiology suspecting due to [place] held on admission and resumed 7/8. Diarrhea improved. Acalabrutinib was again discontinued as her LFTs were worsening. Has new onset ascites on the CT scan-but too small to tap on the ultrasound . Cont supportive measure.  - Prednisone was added to stimulate appetite but discontinued due to confusion.  Recurrent pleural effusion: -LikelyfromLymphoma Vsinfection or 2/2 Acalabrutinib ( less likely per oncology) . S/p rt thoracentesis 1.6 L blood-tinged fluid was removed. Pleural fluid Gram stain no organism, culture no growth so far, cytology:reactive mesothelial cell.  -CTA shows moderate right and mild to moderate left pleural effusions-BNP around 400  -Echo shows preserved EJF 60 to 65%. no diastolic dysfunction, unclear if related to CHF versus low Albumin.   AKI -Creatinine had been slowly uptrending.  Suspicion was prerenal and overdiuresis from Lasix. -Responded well to fluids overnight from 07/23/2020  - okay to d/c IVF and monitor function again   Narrow complex tachycardia->>>Resolved. Converted to sinus rhythm. No recurrent episode -remained stable- Cardiology following  -Continue diltiazem CD 180 nightly, Lopressor 12.5 mg p.o. twice daily, -Following thyroid function -2D echocardiogram reviewed.  Ascites: Stable. -new onset ascites on the CT scan-too small to tap on paracentesis per IR.  Abnormal LFTS:- Improving 2/2 Acalabrutinib-which is discontinued.  LFTs  improving. RUQ [place]- possible cirrhotic changes, hepatitis panel negative. Acalabrutinib resumed,continue to monitor liver enzymes  Acute metabolic encephalopathy from polypharmacy:>>>Resolved -Mentation back to baseline. had confusion/Lethargy: 7/14 a.m likley polypharmacy and pt feels improved after stopping trazodone/prednisone.  -Continue to complain of insomnia, sleeping medication as needed She is on Xanax low-dose continue for anxiety. Continue supportive care.   E coli UTI POA: Completed 7 days of antibiotics.   Non Hodgkin's lymphoma -Remained stable, -Oncology following -with progressive decline, patientwasstartedpast weekonAcalabrutinib she did not have significant response to chemotherapy last year.Acalabrutinibwas held on admission and resumedbut again stopped due to worsening LFTs by oncology.  Oncology resumed acalabrutinib plan if she does not improve would recommend hospice. Monitor liver enzymes while being on acalabrutinib.  Failure to thrive/deconditioning: Remains weak and deconditioned. -PT continues to recommend SNF -She refused SNF. She is high risk for readmission. Husband concerned about poor support. -Patient seems not interested in physical therapy. Encouraged to get out of bed to chair and participate in therapy. -After multiple conversations patient has finally agreed for skilled nursing facility. Case manager notified,authorization in process  Hypertension:BP is controlled. Continue Cardizem.   Pancytopenia with anemia/Thrombocytopenia in the setting of lymphoma/chemo: -Hemoglobin and WBC count overall stable. Monitor.  Heparin was discontinued by hematology due to bilateral lower leg bruise and thrombocytopenia.   History of intermittent atrial fibrillation -Not on anticoagulation due to history of subarachnoid hemorrhage as per cardiology. Bruise on the skin heparin discontinued per oncology. Improving. - cardizem  held in hospital and HR/BP remained controlled    Discharge Diagnoses:   Principal Diagnosis: Recurrent pleural effusion on right  Active Hospital Problems   Diagnosis Date Noted  . Recurrent pleural effusion on right 07/10/2020  . Acute respiratory failure with hypoxia and hypercapnia ([place]) 07/10/2020  . Ascites 06/23/2020  . Anemia 06/18/2019  . Pancytopenia ([place]) 06/18/2019  .  PAF (paroxysmal atrial fibrillation) ([place]) 07/07/2016  . Anemia, iron deficiency 04/02/2014  . Non Hodgkin's lymphoma ([place])   . Hypertension     Resolved Hospital Problems   Diagnosis Date Noted Date Resolved  . Nausea & vomiting 06/23/2020 07/25/2020  . UTI (urinary tract infection) 06/23/2020 07/25/2020    Discharge Instructions    Increase activity slowly   Complete by: As directed      Allergies as of 07/25/2020      Reactions   Demerol [meperidine Hcl] Nausea And Vomiting      Medication List    STOP taking these medications   diltiazem 180 MG 24 hr capsule Commonly known as: DILACOR XR   diphenhydrAMINE 25 MG tablet Commonly known as: BENADRYL   ondansetron 8 MG tablet Commonly known as: ZOFRAN     TAKE these medications   acalabrutinib 100 MG capsule Commonly known as: CALQUENCE Take 1 capsule (100 mg total) by mouth daily. What changed: when to take this   citalopram 40 MG tablet Commonly known as: CELEXA Take 40 mg by mouth daily.   levothyroxine 112 MCG tablet Commonly known as: SYNTHROID Take 224 mcg by mouth daily before breakfast. Pt takes two 112 mcg tablets to equal 224 mcg once a day       Allergies  Allergen Reactions  . Demerol [Meperidine Hcl] Nausea And Vomiting    Discharge Exam: BP (!) 130/48 (BP Location: Right Arm)   Pulse 80   Temp 99.6 F (37.6 C) (Oral)   Resp 18   Ht 5\' 8"  (1.727 m)   Wt 62.6 kg   SpO2 94%   BMI 20.98 kg/m  General appearance: alert, cooperative and no distress Head: Normocephalic, without obvious abnormality,  atraumatic Eyes: EOMI Lungs: clear to auscultation bilaterally Heart: regular rate and rhythm and S1, S2 normal Abdomen: normal findings: bowel sounds normal and soft, non-tender Extremities: no edema Skin: mobility and turgor normal Neurologic: Grossly normal   The results of significant diagnostics from this hospitalization (including imaging, microbiology, ancillary and laboratory) are listed below for reference.   Microbiology: Recent Results (from the past 240 hour(s))  Body fluid culture (includes gram stain)     Status: None   Collection Time: 07/17/20  5:23 PM   Specimen: Pleural Fluid  Result Value Ref Range Status   Specimen Description   Final    PLEURAL Performed at [facility] [address] [place], [place] [address]    Special Requests   Final    NONE Performed at [facility], [place] [address], [place], [place] [address]    Gram Stain   Final    MODERATE WBC PRESENT, PREDOMINANTLY MONONUCLEAR NO ORGANISMS SEEN    Culture   Final    NO GROWTH 3 DAYS Performed at [facility] [address], [place], [place] [address]    Report Status 07/21/2020 FINAL  Final     Labs: BNP (last 3 results) Recent Labs    07/07/20 1640 07/09/20 1559  BNP 400.4* 354.5*   Basic Metabolic Panel: Recent Labs  Lab 07/21/20 0915 07/22/20 0354 07/23/20 0248 07/24/20 0420 07/25/20 0345  NA 136 138 137 133* 137  K 3.5 3.4* 3.6 3.4* 3.6  CL 96* 98 98 100 106  CO2 29 29 27 27 24   GLUCOSE 96 91 97 90 86  BUN 18 20 21 15 12   CREATININE 1.26* 1.48* 1.55* 1.06* 0.90  CALCIUM 8.0* 8.0* 7.8* 7.8* 7.4*  MG  --  2.2 2.3 2.4 2.2  PHOS  --  4.3  --   --   --    Liver Function Tests: Recent Labs  Lab 07/19/20 0354 07/21/20 0915 07/22/20 0354 07/24/20 0420 07/25/20 0345  AST 50* 44* 42* 41 43*  ALT 31 25 24 25 26   ALKPHOS 141* 141* 132* 123 121  BILITOT 1.5* 1.0 0.8 0.7 0.5  PROT 5.4* 5.3* 5.2* 4.9* 4.9*  ALBUMIN 2.6* 2.6*  2.7* 2.4* 2.6*   No results for input(s): LIPASE, AMYLASE in the last 168 hours. No results for input(s): AMMONIA in the last 168 hours. CBC: Recent Labs  Lab 07/21/20 0915 07/22/20 0354 07/23/20 0248 07/24/20 0420 07/25/20 0345  WBC 6.4 9.6 8.2 7.2 6.5  NEUTROABS 3.0  --  3.8 3.1 3.0  HGB 9.8* 9.9* 9.8* 9.4* 9.3*  HCT 29.3* 30.3* 29.4* 28.5* 28.2*  MCV 102.1* 102.7* 103.2* 102.9* 104.4*  PLT 165 206 181 186 179   Cardiac Enzymes: No results for input(s): CKTOTAL, CKMB, CKMBINDEX, TROPONINI in the last 168 hours. BNP: Invalid input(s): POCBNP CBG: No results for input(s): GLUCAP in the last 168 hours. D-Dimer No results for input(s): DDIMER in the last 72 hours. Hgb A1c No results for input(s): HGBA1C in the last 72 hours. Lipid Profile No results for input(s): CHOL, HDL, LDLCALC, TRIG, CHOLHDL, LDLDIRECT in the last 72 hours. Thyroid function studies No results for input(s): TSH, T4TOTAL, T3FREE, THYROIDAB in the last 72 hours.  Invalid input(s): FREET3 Anemia work up No results for input(s): VITAMINB12, FOLATE, FERRITIN, TIBC, IRON, RETICCTPCT in the last 72 hours. Urinalysis    Component Value Date/Time   COLORURINE YELLOW 06/23/2020 1411   APPEARANCEUR CLOUDY (A) 06/23/2020 1411   LABSPEC 1.015 06/23/2020 1411   PHURINE 5.0 06/23/2020 1411   GLUCOSEU NEGATIVE 06/23/2020 1411   HGBUR SMALL (A) 06/23/2020 1411   BILIRUBINUR NEGATIVE 06/23/2020 1411   KETONESUR 5 (A) 06/23/2020 1411   PROTEINUR 100 (A) 06/23/2020 1411   UROBILINOGEN 0.2 05/19/2012 1139   NITRITE POSITIVE (A) 06/23/2020 1411   LEUKOCYTESUR LARGE (A) 06/23/2020 1411   Sepsis Labs Invalid input(s): PROCALCITONIN,  WBC,  LACTICIDVEN Microbiology Recent Results (from the past 240 hour(s))  Body fluid culture (includes gram stain)     Status: None   Collection Time: 07/17/20  5:23 PM   Specimen: Pleural Fluid  Result Value Ref Range Status   Specimen Description   Final    PLEURAL Performed  at [facility], [place] [address] [place], [place] [address]    Special Requests   Final    NONE Performed at [facility], [place] [address] [place], [place] [address]    Gram Stain   Final    MODERATE WBC PRESENT, PREDOMINANTLY MONONUCLEAR NO ORGANISMS SEEN    Culture   Final    NO GROWTH 3 DAYS Performed at [facility] [address], [place], [place] [address]    Report Status 07/21/2020 FINAL  Final    Procedures/Studies: DG Chest 1 View  Result Date: 07/08/2020 CLINICAL DATA:  86 year old female status post right thoracentesis. EXAM: CHEST  1 VIEW COMPARISON:  Chest radiograph dated 07/06/2020. FINDINGS: Right-sided Port-A-Cath in similar position. Bilateral patchy airspace opacities, right greater left with interval progression compared to prior radiograph may represent edema or pneumonia. Clinical correlation is recommended. Small bilateral pleural effusions. No pneumothorax. Stable cardiac silhouette. Atherosclerotic calcification of the aorta. No acute osseous pathology. IMPRESSION: 1. Small  bilateral pleural effusions.  No pneumothorax. 2. Bilateral airspace opacities, progressed since the prior radiograph. Electronically Signed   By: Anner Crete M.D.   On: 07/08/2020 18:26   DG Chest 1 View  Result Date: 07/04/2020 CLINICAL DATA:  Status post right thoracentesis. EXAM: CHEST  1 VIEW COMPARISON:  Chest x-ray from yesterday. FINDINGS: Unchanged right chest wall port catheter. The heart size and mediastinal contours are within normal limits. Normal pulmonary vascularity. Trace residual right pleural effusion status post thoracentesis. Improved aeration at the right lung base. No pneumothorax. Unchanged small left pleural effusion and basilar atelectasis. No acute osseous abnormality. IMPRESSION: 1. Trace residual right pleural effusion status post thoracentesis. No pneumothorax. 2. Unchanged small left pleural effusion and basilar  atelectasis. Electronically Signed   By: Titus Dubin M.D.   On: 07/04/2020 11:42   DG Chest 2 View  Result Date: 07/14/2020 CLINICAL DATA:  Dyspnea. EXAM: CHEST - 2 VIEW COMPARISON:  07/09/2020 FINDINGS: Right chest wall port a catheter is noted with tip at the cavoatrial junction. Moderate right pleural effusion the with [place] opacification of the right lower lobe is again noted. When compared with the previous exam there is improved aeration to the right and left midlung zones and left lower lobe. IMPRESSION: 1. Improved aeration to the right and left lower lobes. 2. Persistent right pleural effusion. Electronically Signed   By: Kerby Moors M.D.   On: 07/14/2020 12:16   DG Chest 2 View  Result Date: 07/09/2020 CLINICAL DATA:  Follow-up pleural effusion. EXAM: CHEST - 2 VIEW COMPARISON:  07/08/20 FINDINGS: Right chest wall port a catheter is noted with tip projecting over the cavoatrial junction. Interval increase in volume of right pleural effusion. Mild diffuse pulmonary edema. Bilateral airspace opacities are noted within the left upper lobe, right midlung and right base concerning for multifocal pneumonia. IMPRESSION: 1. Increase in volume of right pleural effusion with mild interstitial edema 2. Bilateral airspace opacities concerning for multifocal pneumonia. Electronically Signed   By: Kerby Moors M.D.   On: 07/09/2020 11:21   DG Chest 2 View  Result Date: 07/06/2020 CLINICAL DATA:  Worsening shortness of breath. EXAM: CHEST - 2 VIEW COMPARISON:  07/04/2020 FINDINGS: The cardiac silhouette remains grossly normal in size, spur partially obscured by significantly increased patchy opacity in the right lower lung zone. Small amount of posterior pleural fluid. Mild linear density at the left lung base. Right subclavian porta catheter tip in the region of the superior cavoatrial junction. Unremarkable bones. IMPRESSION: 1. Significantly increased right lower lung zone pneumonia. 2. Small amount  of posterior pleural fluid. 3. Mild left basilar atelectasis. Electronically Signed   By: Claudie Revering M.D.   On: 07/06/2020 16:24   DG Chest 2 View  Result Date: 07/03/2020 CLINICAL DATA:  Dyspnea, pleural effusions EXAM: CHEST - 2 VIEW COMPARISON:  06/26/2020 FINDINGS: Frontal and lateral views of the chest demonstrates stable right chest wall port. Cardiac silhouette is unremarkable. There is now a moderate right pleural effusion with right basilar consolidation. Decreased left pleural effusion, with only trace fluid at the left costophrenic angle. Persistent retrocardiac consolidation. No pneumothorax. No acute bony abnormalities. IMPRESSION: 1. Decreased left pleural effusion, with persistent left basilar consolidation. 2. Increasing right pleural effusion, with increasing right basilar consolidation. Electronically Signed   By: Randa Ngo M.D.   On: 07/03/2020 16:11   DG Chest 2 View  Result Date: 06/26/2020 CLINICAL DATA:  Dyspnea. EXAM: CHEST - 2 VIEW COMPARISON:  Chest CT 05/22/2020  FINDINGS: Right chest port remains in place. Hazy opacity at the left lung base consistent with pleural effusion and airspace disease/atelectasis. Right lung is clear. Upper normal heart size. Mild right hilar prominence likely secondary to combination of vascular structures and adenopathy is seen on recent CT. Fullness in the upper mediastinum likely secondary to adenopathy. No pneumothorax. No pulmonary edema. Surgical clips in the left axilla. IMPRESSION: 1. Hazy opacity at the left lung base consistent with pleural effusion and airspace disease/atelectasis. 2. Mild right hilar prominence likely secondary to combination of vascular structures and adenopathy seen on recent CT. Electronically Signed   By: Keith Rake M.D.   On: 06/26/2020 15:54   CT ANGIO CHEST PE W OR WO CONTRAST  Result Date: 07/07/2020 CLINICAL DATA:  Lymphoma, recurrent right pleural effusion bloody tap EXAM: CT ANGIOGRAPHY CHEST WITH  CONTRAST TECHNIQUE: Multidetector CT imaging of the chest was performed using the standard protocol during bolus administration of intravenous contrast. Multiplanar CT image reconstructions and MIPs were obtained to evaluate the vascular anatomy. CONTRAST:  122mL OMNIPAQUE IOHEXOL 350 MG/ML SOLN COMPARISON:  None. FINDINGS: Cardiovascular: Aortic atherosclerosis. Normal heart size. Central pulmonary arteries are patent. Mediastinum/Nodes: Adenopathy is identified in the supraclavicular region, bilateral axilla, and mediastinum. Nodes appear and measures slightly smaller in size. Lungs/Pleura: Moderate right and mild to moderate left pleural effusions measuring simple fluid in density. Imaging performed during expiration. Persistent atelectasis at the left lung base. New atelectasis/consolidation at the right lung base. Interlobular septal thickening the non dependent right lung may reflect asymmetric edema. Upper Abdomen: Ascites. Unchanged ill-defined hypoattenuating lesion in the superior spleen. Musculoskeletal: No new abnormality. Review of the MIP images confirms the above findings. IMPRESSION: Moderate right and mild to moderate left pleural effusions. Right basilar atelectasis/consolidation and left basilar atelectasis. Interlobular septal thickening in the right lung may reflect asymmetric edema. Multifocal adenopathy appears stable to slightly improved. Electronically Signed   By: Macy Mis M.D.   On: 07/07/2020 14:33   DG CHEST PORT 1 VIEW  Result Date: 07/17/2020 CLINICAL DATA:  Status post thoracentesis. EXAM: PORTABLE CHEST 1 VIEW COMPARISON:  Radiograph yesterday. FINDINGS: Decreased size of right pleural effusion post thoracentesis. Small residual at the right lung base. No visualized pneumothorax. Right chest port remains in place. Heart is normal in size. Left lung is clear without focal abnormality. No significant left pleural effusion. IMPRESSION: Decreased size of right pleural effusion  post thoracentesis, small residual at the right lung base. No visualized pneumothorax. Electronically Signed   By: Keith Rake M.D.   On: 07/17/2020 17:36   DG CHEST PORT 1 VIEW  Result Date: 07/16/2020 CLINICAL DATA:  86 year old female with history of shortness of breath. Pleural effusion. EXAM: PORTABLE CHEST 1 VIEW COMPARISON:  Chest x-ray 07/14/2020. FINDINGS: Right subclavian single-lumen porta cath with tip terminating at the superior cavoatrial junction. Enlarging large right pleural effusion with extensive passive atelectasis in the right mid to lower lung. Left lung is clear. No left pleural effusion. No pneumothorax. No evidence of pulmonary edema. Heart size appears normal. Aortic atherosclerosis. Surgical clips project over the left axillary region, presumably from prior left axillary nodal dissection. IMPRESSION: 1. Enlarging right pleural effusion with worsening passive atelectasis in the right lung. 2. Aortic atherosclerosis. Electronically Signed   By: Vinnie Langton M.D.   On: 07/16/2020 10:17   ECHOCARDIOGRAM COMPLETE  Result Date: 07/07/2020    ECHOCARDIOGRAM REPORT   Patient Name:   AMARIS DELAFUENTE Date of Exam: 07/07/2020 Medical Rec #:  [id number]            Height:       68.0 in Accession #:    [id number]           Weight:       174.0 lb Date of Birth:  1933-12-29           BSA:          1.926 m Patient Age:    28 years             BP:           132/65 mmHg Patient Gender: F                    HR:           855 bpm. Exam Location:  Inpatient Procedure: 2D Echo Indications:    Atrial tachycardia [facility]) [[id number]]  History:        Patient has prior history of Echocardiogram examinations, most                 recent 05/31/2019. Risk Factors:Hypertension. Lymphoma , thyroid                 disease, thyroid cancer. Past history of afib.  Sonographer:    Darlina Sicilian RDCS Referring Phys: [place]  1. Left ventricular ejection fraction, by estimation, is 60 to  65%. The left ventricle has normal function. The left ventricle has no regional wall motion abnormalities. Left ventricular diastolic parameters were normal.  2. Right ventricular systolic function is normal. The right ventricular size is normal.  3. Moderate pleural effusion in both left and right lateral regions.  4. The mitral valve is normal in structure. Trivial mitral valve regurgitation. No evidence of mitral stenosis.  5. The aortic valve is tricuspid. Aortic valve regurgitation is not visualized. No aortic stenosis is present.  6. The inferior vena cava is normal in size with <50% respiratory variability, suggesting right atrial pressure of 8 mmHg. Conclusion(s)/Recommendation(s): Normal biventricular function without evidence of hemodynamically significant valvular heart disease. FINDINGS  Left Ventricle: Left ventricular ejection fraction, by estimation, is 60 to 65%. The left ventricle has normal function. The left ventricle has no regional wall motion abnormalities. The left ventricular internal cavity size was normal in size. There is  no left ventricular hypertrophy. Left ventricular diastolic parameters were normal. Right Ventricle: The right ventricular size is normal. No increase in right ventricular wall thickness. Right ventricular systolic function is normal. Left Atrium: Left atrial size was normal in size. Right Atrium: Right atrial size was normal in size. Pericardium: Trivial pericardial effusion is present. Mitral Valve: The mitral valve is normal in structure. Trivial mitral valve regurgitation. No evidence of mitral valve stenosis. Tricuspid Valve: The tricuspid valve is normal in structure. Tricuspid valve regurgitation is mild . No evidence of tricuspid stenosis. Aortic Valve: The aortic valve is tricuspid. Aortic valve regurgitation is not visualized. No aortic stenosis is present. Pulmonic Valve: The pulmonic valve was grossly normal. Pulmonic valve regurgitation is trivial. Aorta:  The aortic root, ascending aorta and aortic arch are all structurally normal, with no evidence of dilitation or obstruction. Venous: The inferior vena cava is normal in size with less than 50% respiratory variability, suggesting right atrial pressure of 8 mmHg. IAS/Shunts: No atrial level shunt detected by color flow Doppler. Additional Comments: There is a moderate pleural effusion in both left and right lateral regions.  LEFT VENTRICLE  PLAX 2D LVIDd:         5.10 cm  Diastology LVIDs:         3.30 cm  LV e' lateral:   8.27 cm/s LV PW:         0.90 cm  LV E/e' lateral: 9.2 LV IVS:        0.70 cm  LV e' medial:    6.64 cm/s LVOT diam:     1.80 cm  LV E/e' medial:  11.5 LV SV:         30 LV SV Index:   15 LVOT Area:     2.54 cm  RIGHT VENTRICLE RV S prime:     14.80 cm/s TAPSE (M-mode): 2.4 cm LEFT ATRIUM           Index       RIGHT ATRIUM           Index LA diam:      3.00 cm 1.56 cm/m  RA Area:     13.80 cm LA Vol (A2C): 44.7 ml 23.21 ml/m RA Volume:   33.50 ml  17.39 ml/m LA Vol (A4C): 42.6 ml 22.12 ml/m  AORTIC VALVE LVOT Vmax:   77.70 cm/s LVOT Vmean:  49.700 cm/s LVOT VTI:    0.116 m  AORTA Ao Root diam: 3.00 cm MITRAL VALVE MV Area (PHT): 4.96 cm    SHUNTS MV Decel Time: 153 msec    Systemic VTI:  0.12 m MV E velocity: 76.30 cm/s  Systemic Diam: 1.80 cm MV A velocity: 78.80 cm/s MV E/A ratio:  0.97 Buford Dresser MD Electronically signed by Buford Dresser MD Signature Date/Time: 07/07/2020/5:02:22 PM    Final    [place] ASCITES (ABDOMEN LIMITED)  Result Date: 07/01/2020 CLINICAL DATA:  History of lymphoma, now with concern for symptomatic intra-abdominal ascites. Please perform ascites search ultrasound and ultrasound-guided paracentesis as indicated. EXAM: LIMITED ABDOMEN ULTRASOUND FOR ASCITES TECHNIQUE: Limited ultrasound survey for ascites was performed in all four abdominal quadrants. COMPARISON:  CT abdomen pelvis-06/23/2020 FINDINGS: Sonographic evaluation demonstrates a trace amount  of intra-abdominal ascites, too small to allow for safe ultrasound-guided paracentesis. Note is made of small bilateral pleural effusions, the right side of which appears new compared to abdominal CT performed 06/23/2020. IMPRESSION: 1. Trace amount of intra-abdominal ascites, too small to allow for safe ultrasound-guided paracentesis. No paracentesis attempted. 2. Small bilateral pleural effusions - the left-sided pleural effusion appears similar to abdominal CT performed 06/23/2020 however the right-sided pleural effusion appears new of the CT. Electronically Signed   By: Sandi Mariscal M.D.   On: 07/01/2020 15:11   US Abdomen Limited RUQ  Result Date: 07/02/2020 CLINICAL DATA:  Abnormal LFTs EXAM: ULTRASOUND ABDOMEN LIMITED RIGHT UPPER QUADRANT COMPARISON:  CT 06/23/2020.  Ultrasound 07/01/2020 FINDINGS: Gallbladder: Prior cholecystectomy Common bile duct: Diameter: Prominent measuring up to 10 mm, likely related to post cholecystectomy state. Liver: Heterogeneous, slightly increased echotexture. Subtle nodular contours to the liver surface. Findings raise the possibility of cirrhosis. No suspicious focal hepatic abnormality. Portal vein is patent on color Doppler imaging with normal direction of blood flow towards the liver. Other: Ascites and right effusion noted. Prominent porta hepatis lymph nodes. IMPRESSION: Heterogeneous, increased echotexture throughout the liver. Subtle nodular contour seen in some areas of the liver. Findings suggest the possibility of cirrhosis. Perihepatic ascites and right pleural effusion. Mild biliary ductal dilatation, likely related to post cholecystectomy state. Prominent porta hepatis lymph nodes, likely related to liver disease. Electronically Signed  By: Rolm Baptise M.D.   On: 07/02/2020 08:52   US THORACENTESIS ASP PLEURAL SPACE W/IMG GUIDE  Result Date: 07/08/2020 INDICATION: Lymphoma, remote colon and thyroid cancers, dyspnea, right pleural effusion.request for  diagnostic and therapeutic thoracentesis. EXAM: ULTRASOUND GUIDED RIGHT THORACENTESIS MEDICATIONS: 1% lidocaine 10 mL COMPLICATIONS: None immediate. PROCEDURE: An ultrasound guided thoracentesis was thoroughly discussed with the patient and questions answered. The benefits, risks, alternatives and complications were also discussed. The patient understands and wishes to proceed with the procedure. Written consent was obtained. Ultrasound was performed to localize and mark an adequate pocket of fluid in the right chest. The area was then prepped and draped in the normal sterile fashion. 1% Lidocaine was used for local anesthesia. Under ultrasound guidance a 6 Fr Safe-T-Centesis catheter was introduced. Thoracentesis was performed. The catheter was removed and a dressing applied. FINDINGS: A total of approximately 500 mL of red fluid was removed. Samples were sent to the laboratory as requested by the clinical team. IMPRESSION: Successful ultrasound guided right thoracentesis yielding 500 mL of pleural fluid. Read by: Gareth Eagle, PA-C Electronically Signed   By: Jerilynn Mages.  Shick M.D.   On: 07/08/2020 16:31   US THORACENTESIS ASP PLEURAL SPACE W/IMG GUIDE  Result Date: 07/04/2020 INDICATION: Patient with history of lymphoma, remote colon and thyroid cancers, dyspnea, right pleural effusion. Request made for diagnostic and therapeutic right thoracentesis. EXAM: ULTRASOUND GUIDED DIAGNOSTIC AND THERAPEUTIC RIGHT THORACENTESIS MEDICATIONS: 1% lidocaine to skin and subcutaneous tissue COMPLICATIONS: None immediate. PROCEDURE: An ultrasound guided thoracentesis was thoroughly discussed with the patient and questions answered. The benefits, risks, alternatives and complications were also discussed. The patient understands and wishes to proceed with the procedure. Written consent was obtained. Ultrasound was performed to localize and mark an adequate pocket of fluid in the right chest. The area was then prepped and draped in the  normal sterile fashion. 1% Lidocaine was used for local anesthesia. Under ultrasound guidance a 6 Fr Safe-T-Centesis catheter was introduced. Thoracentesis was performed. The catheter was removed and a dressing applied. FINDINGS: A total of approximately 1.6 liters of blood-tinged fluid was removed. Samples were sent to the laboratory as requested by the clinical team. IMPRESSION: Successful ultrasound guided diagnostic and therapeutic right thoracentesis yielding 1.6 liters of pleural fluid. Read by: Rowe Robert, PA-C Electronically Signed   By: Lucrezia Europe M.D.   On: 07/04/2020 11:16     Time coordinating discharge: Over 30 minutes    Dwyane Dee, MD  [facility] 07/25/2020, 3:16 PM Pager: Secure chat  If 7PM-7AM, please contact night-coverage [email or web address] Password [email or web address]

## 2020-07-25 NOTE — Plan of Care (Signed)
  Problem: Education: Goal: Knowledge of General Education information will improve Description: Including pain rating scale, medication(s)/side effects and non-pharmacologic comfort measures Outcome: Adequate for Discharge   Problem: Health Behavior/Discharge Planning: Goal: Ability to manage health-related needs will improve Outcome: Adequate for Discharge   Problem: Clinical Measurements: Goal: Ability to maintain clinical measurements within normal limits will improve Outcome: Adequate for Discharge Goal: Will remain free from infection Outcome: Adequate for Discharge Goal: Diagnostic test results will improve Outcome: Adequate for Discharge Goal: Respiratory complications will improve Outcome: Adequate for Discharge Goal: Cardiovascular complication will be avoided Outcome: Adequate for Discharge   Problem: Activity: Goal: Risk for activity intolerance will decrease Outcome: Adequate for Discharge   Problem: Nutrition: Goal: Adequate nutrition will be maintained Outcome: Adequate for Discharge   Problem: Coping: Goal: Level of anxiety will decrease Outcome: Adequate for Discharge   Problem: Elimination: Goal: Will not experience complications related to bowel motility Outcome: Adequate for Discharge Goal: Will not experience complications related to urinary retention Outcome: Adequate for Discharge   Problem: Safety: Goal: Ability to remain free from injury will improve Outcome: Adequate for Discharge   Problem: Skin Integrity: Goal: Risk for impaired skin integrity will decrease Outcome: Adequate for Discharge   

## 2020-08-01 ENCOUNTER — Other Ambulatory Visit: Payer: Self-pay | Admitting: Oncology

## 2020-08-01 DIAGNOSIS — C8590 Non-Hodgkin lymphoma, unspecified, unspecified site: Secondary | ICD-10-CM

## 2020-08-04 MED FILL — CALQUENCE 100 MG CAPSULE: 100 | 30 days supply | Qty: 30 | Fill #0

## 2020-08-05 ENCOUNTER — Telehealth: Payer: Self-pay

## 2020-08-05 NOTE — Telephone Encounter (Signed)
Husband called to reschedule appointment from last missed appointment. Husband states that she is going to come home from the Rehab center. Gave husband appointment details. He verbalized understanding and stated they will be here tomorrow. And if anything comes up and they can not come he will call back.

## 2020-08-06 ENCOUNTER — Other Ambulatory Visit: Payer: Self-pay

## 2020-08-06 ENCOUNTER — Inpatient Hospital Stay: Payer: Medicare PPO

## 2020-08-06 ENCOUNTER — Inpatient Hospital Stay: Payer: Medicare PPO | Attending: Oncology | Admitting: Nurse Practitioner

## 2020-08-06 ENCOUNTER — Telehealth: Payer: Self-pay | Admitting: *Deleted

## 2020-08-06 VITALS — BP 98/50 | HR 86 | Temp 97.8°F | Resp 18 | Ht 68.0 in | Wt 162.2 lb

## 2020-08-06 DIAGNOSIS — Z95828 Presence of other vascular implants and grafts: Secondary | ICD-10-CM

## 2020-08-06 DIAGNOSIS — C73 Malignant neoplasm of thyroid gland: Secondary | ICD-10-CM

## 2020-08-06 DIAGNOSIS — C8267 Cutaneous follicle center lymphoma, spleen: Secondary | ICD-10-CM | POA: Diagnosis present

## 2020-08-06 DIAGNOSIS — R630 Anorexia: Secondary | ICD-10-CM | POA: Insufficient documentation

## 2020-08-06 DIAGNOSIS — R110 Nausea: Secondary | ICD-10-CM | POA: Diagnosis not present

## 2020-08-06 DIAGNOSIS — C8510 Unspecified B-cell lymphoma, unspecified site: Secondary | ICD-10-CM

## 2020-08-06 DIAGNOSIS — C8261 Cutaneous follicle center lymphoma, lymph nodes of head, face, and neck: Secondary | ICD-10-CM | POA: Insufficient documentation

## 2020-08-06 DIAGNOSIS — C8590 Non-Hodgkin lymphoma, unspecified, unspecified site: Secondary | ICD-10-CM | POA: Diagnosis not present

## 2020-08-06 LAB — CMP (CANCER CENTER ONLY)
ALT: 15 U/L (ref 0–44)
AST: 20 U/L (ref 15–41)
Albumin: 2.5 g/dL — ABNORMAL LOW (ref 3.5–5.0)
Alkaline Phosphatase: 156 U/L — ABNORMAL HIGH (ref 38–126)
Anion gap: 9 (ref 5–15)
BUN: 17 mg/dL (ref 8–23)
CO2: 23 mmol/L (ref 22–32)
Calcium: 8.6 mg/dL — ABNORMAL LOW (ref 8.9–10.3)
Chloride: 105 mmol/L (ref 98–111)
Creatinine: 0.99 mg/dL (ref 0.44–1.00)
GFR, Est AFR Am: >60 mL/min (ref >60)
GFR, Estimated: 53 mL/min — ABNORMAL LOW (ref >60)
Glucose, Bld: 101 mg/dL — ABNORMAL HIGH (ref 70–99)
Potassium: 3.5 mmol/L (ref 3.5–5.1)
Sodium: 137 mmol/L (ref 135–145)
Total Bilirubin: 0.5 mg/dL (ref 0.3–1.2)
Total Protein: 5.4 g/dL — ABNORMAL LOW (ref 6.5–8.1)

## 2020-08-06 LAB — CBC WITH DIFFERENTIAL (CANCER CENTER ONLY)
Abs Immature Granulocytes: 0.25 10*3/uL — ABNORMAL HIGH (ref 0.00–0.07)
Basophils Absolute: 0 10*3/uL (ref 0.0–0.1)
Basophils Relative: 0 %
Eosinophils Absolute: 0 10*3/uL (ref 0.0–0.5)
Eosinophils Relative: 0 %
HCT: 33.8 % — ABNORMAL LOW (ref 36.0–46.0)
Hemoglobin: 11 g/dL — ABNORMAL LOW (ref 12.0–15.0)
Immature Granulocytes: 3 %
Lymphocytes Relative: 24 %
Lymphs Abs: 2.1 10*3/uL (ref 0.7–4.0)
MCH: 33.4 pg (ref 26.0–34.0)
MCHC: 32.5 g/dL (ref 30.0–36.0)
MCV: 102.7 fL — ABNORMAL HIGH (ref 80.0–100.0)
Monocytes Absolute: 0.7 10*3/uL (ref 0.1–1.0)
Monocytes Relative: 7 %
Neutro Abs: 5.9 10*3/uL (ref 1.7–7.7)
Neutrophils Relative %: 66 %
Platelet Count: 184 10*3/uL (ref 150–400)
RBC: 3.29 MIL/uL — ABNORMAL LOW (ref 3.87–5.11)
RDW: 15.9 % — ABNORMAL HIGH (ref 11.5–15.5)
WBC Count: 9 10*3/uL (ref 4.0–10.5)
nRBC: 0 % (ref 0.0–0.2)

## 2020-08-06 MED ORDER — PREDNISONE 10 MG PO TABS: 10.0000 mg | ORAL_TABLET | Freq: Every day | ORAL | 1 refills | Status: AC

## 2020-08-06 MED ORDER — SODIUM CHLORIDE 0.9% FLUSH
10.0000 mL | Freq: Once | INTRAVENOUS | Status: AC
  Administered 2020-08-06: 10 mL
  Filled 2020-08-06: qty 10

## 2020-08-06 MED ORDER — HEPARIN SOD (PORK) LOCK FLUSH 100 UNIT/ML IV SOLN
500.0000 [IU] | Freq: Once | INTRAVENOUS | Status: AC
  Administered 2020-08-06: 500 [IU]
  Filled 2020-08-06: qty 5

## 2020-08-06 NOTE — Progress Notes (Addendum)
[facility] OFFICE PROGRESS NOTE   Diagnosis: Non-Hodgkin's lymphoma  INTERVAL HISTORY:   Tracy Mclaughlin returns for her first post hospital follow-up visit.  She continues acalabrutinib.  She was discharged home from the nursing facility yesterday.  She in general does not feel well.  No fevers or sweats.  Poor appetite.  Her husband describes her appetite as "almost nonexistent".  She continues to have nausea.  Small loose stools.  She notes blood with wiping after a bowel movement which she attributes to a tear at the rectum.  She describes her breathing is "pretty good".  Unchanged cough.  No fever.  Objective:  Vital signs in last 24 hours:  Blood pressure (!) 98/50, pulse 86, temperature 97.8 F (36.6 C), temperature source Tympanic, resp. rate 18, height 5\' 8"  (1.727 m), weight 162 lb 3.2 oz (73.6 kg), SpO2 98 %.    Lymphatics: Small left cervical and left axillary lymph nodes. Resp: Lungs are clear.  Breath sounds diminished at the right lower lung field.  No respiratory distress. Cardio: Regular rate and rhythm. GI: Abdomen soft and nontender.  No hepatosplenomegaly. Vascular: No leg edema. Skin: Bruises scattered over the right forearm. Port-A-Cath without erythema.   Lab Results:  Lab Results  Component Value Date   WBC 9.0 08/06/2020   HGB 11.0 (L) 08/06/2020   HCT 33.8 (L) 08/06/2020   MCV 102.7 (H) 08/06/2020   PLT 184 08/06/2020   NEUTROABS 5.9 08/06/2020    Imaging:  No results found.  Medications: I have reviewed the patient's current medications.  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 Status post weekly Rituxan x4 03/01/2012 through 03/22/2012. She completed 4 "maintenance" doses of Rituxan, last on 12/19/2012. A restaging CT on 02/09/2013 showed no evidence of lymphoma.   Lymph node lateral to the thyroid bed  on a neck ultrasound 02/21/2014, status post an FNA biopsy concerning for a lymphoproliferative disorder.  PET scan 09/28/2016 with active lymphoma within the neck, chest, abdomen, pelvis; splenic enlargement and hypermetabolism suspicious for splenic involvement.  Initiation of Rituxan weekly 4 09/29/2016  Initiation of maintenance Rituxan on a 3 month schedule 12/23/2016; final Rituxan 08/31/2018  Thyroid ultrasound 02/07/2019-left cervical lymphadenopathy  PET scan 03/08/2019-extensive recurrent hypermetabolic lymphoma involving the neck, chest, abdomen and pelvis.  03/16/2019 left cervical lymph node biopsy-features consistent with previously diagnosed non-Hodgkin's B-cell lymphoma, phenotypically consistent with marginal zone lymphoma. Flow cytometry with lambda restricted B-cell population without expression of CD5 or CD10 comprising 87% of all lymphocytes.  Cycle 1 bendamustine/Rituxan 03/22/2019  Excision deep left axillary lymph nodes 05/04/2019-non-Hodgkin's B-cell lymphoma with differential including a marginal zone lymphoma and atypical small lymphocytic lymphoma. Flow cytometry with monoclonal B-cell population without expression of CD5 or CD10, comprises 96% of all lymphocytes.  Cycle 1 [facility]OP/Rituxan 05/18/2019  Cycle 2 [facility]/Rituxan 06/08/2019  CTs 06/15/2019-partial improvement in diffuse adenopathy, stable mild splenomegaly  Cycle 1 Revlimid/rituximab 07/19/2019 (Revlimid start 07/20/2019)  Cycle 2 Revlimid/rituximab 08/16/2019 (Revlimid placed on hold 08/30/2019 due to neutropenia)  Cycle 3 of Revlimid/rituximab 09/13/2019 (Revlimid schedule changed to 14 days on/14 days off)  Cycle 4 Revlimid/rituximab 10/10/2019  Cycle 5 Revlimid/rituximab 11/07/2019  Cycle 6 Revlimid/rituximab 12/07/2019  CTs 12/28/2019-diffuse lymphadenopathy-slightly increased compared to 06/15/2019  Cycle 7 Revlimid/rituximab 01/04/2020  Cycle 8 Revlimid/Rituxan 02/01/2020  Cycle 9 Revlimid/Rituxan  03/05/2020  Cycle 10 Revlimid/Rituxan 04/02/2020  Cycle 11 Revlimid/Rituxan 04/30/2020  CT 05/22/2020-mild increase in left supraclavicular adenopathy, mild  increase in chest, retroperitoneal, and pelvic adenopathy. Stable mild splenomegaly.  Acalabrutinib started 06/19/2020  Acalabrutinib discontinued on hospital admission 06/23/2020 secondary to nausea and diarrhea  acalabrutinib resumed 06/26/2020  CT abdomen/pelvis 06/23/2020-moderate left and trace right pleural effusions, new diffuse mesenteric edema and ascites, stable retroperitoneal and iliac adenopathy, mild splenomegaly  Acalabrutinib discontinued 07/02/2020 secondary to altered mental status  Acalabrutinib resumed 07/17/2020  2. Stage IV (T1bN1b M0) papillary thyroid cancer, status post a thyroidectomy with reimplantation of the left superior parathyroid gland on 05/23/2012, status post radioactive iodine therapy, followed by Dr. Buddy Duty.  3. Stage II (T3 N0) colon cancer, status post a right colectomy 10/19/2011, last colonoscopy April 2015-sigmoid adenoma removed.  4. History of a pulmonary embolism December 2012.  5. History of Atrial fibrillation 6. Iron deficiency anemia-new 03/18/2014. Hemoccult positive stool. The anemia corrected with iron. No longer taking iron.  Status post an upper endoscopy and colonoscopy by Dr. Carlean Purl April 2015 with no bleeding source identified, benign adenoma removed from the sigmoid colon. 7. Report of an upper gastric intestinal bleed fall 2016-managed in [place]. [name] 8. Left knee replacement May 2017, repeat left knee surgery May 2018 9. Pruritic rash 07/22/2016 10. Nausea and diarrhea 04/02/2019-stool negative for C. difficile toxin 11. 06/18/2019 [facility] admission for symptomatic anemia. 12.  Admission 06/23/2020 with nausea and diarrhea 13.  Respiratory failure, bilateral pleural effusions  Right thoracentesis 07/04/2020-culture and cytology negative  CT chest 07/07/2020-right and left pleural  effusions, bilateral basilar atelectasis, interlobular septal thickening in the right lung-asymmetric edema?  Improved adenopathy  Right thoracentesis 07/08/2020-flow cytometry with a small clonal B-cell population  Right thoracentesis 07/17/2020-flow cytometry and cytology pending, no result in computer when checked 08/06/2020   Disposition: Tracy Mclaughlin continues acalabrutinib.  She has stable small peripheral lymph nodes.  Performance status in general is poor.  It is not clear if this is due to lymphoma versus another etiology.  She is scheduled to begin home health services tomorrow including occupational therapy and physical therapy.  For the anorexia she will begin prednisone 10 mg daily.  We discussed potential side effects associated with steroids.  We reviewed the CBC and chemistry panel from today.  Labs adequate to continue acalabrutinib.  She will return for lab and follow-up on 08/21/2020.  She will contact the office in the interim with any problems.  Patient seen with Dr. Benay Spice.  Ned Card ANP/GNP-BC   08/06/2020  11:23 AM This was a shared visit with Ned Card.  Tracy Mclaughlin was interviewed and examined.  The palpable lymph nodes appear unchanged.  She appears to be tolerating the acalabrutinib well, but her performance status continues to decline.  It is unclear whether her symptoms are related to lymphoma or another etiology.  She will begin prednisone as an appetite stimulant.  She will return for reassessment in approximately 2 weeks.  She and her husband will contact us in the interim as needed.  Julieanne Manson, MD

## 2020-08-06 NOTE — Telephone Encounter (Signed)
Nurse, Reita Cliche had left VM yesterday asking if Dr. Benay Spice is willing to sign off on all her nursing, PT/OT orders? Called back and left VM that yes, he will. Also requested nurse to f/u on the delivery of her w/c.

## 2020-08-07 ENCOUNTER — Telehealth: Payer: Self-pay | Admitting: Nurse Practitioner

## 2020-08-07 ENCOUNTER — Telehealth: Payer: Self-pay | Admitting: *Deleted

## 2020-08-07 NOTE — Telephone Encounter (Signed)
Scheduled appointments per 8/18 los. Spoke with patient's husband who is aware of appointments dates and times.

## 2020-08-07 NOTE — Telephone Encounter (Signed)
Reporting her oxygen sats were running 85-90% at rest on room air. Was in no distress and denies shortness of breath. She does not have oxygen in the home. Does MD want to order overnight pulse oxygen monitoring to see if she needs oxygen in home?

## 2020-08-08 NOTE — Telephone Encounter (Signed)
Faxed order for overnight pulse oximetry to [facility] as requested [phone]

## 2020-08-12 ENCOUNTER — Other Ambulatory Visit: Payer: Self-pay | Admitting: Nurse Practitioner

## 2020-08-21 ENCOUNTER — Other Ambulatory Visit: Payer: Self-pay

## 2020-08-21 ENCOUNTER — Other Ambulatory Visit: Payer: Medicare PPO

## 2020-08-21 ENCOUNTER — Inpatient Hospital Stay: Payer: Medicare PPO | Attending: Oncology

## 2020-08-21 ENCOUNTER — Ambulatory Visit: Payer: Medicare PPO | Admitting: Oncology

## 2020-08-21 ENCOUNTER — Encounter: Payer: Self-pay | Admitting: Nurse Practitioner

## 2020-08-21 ENCOUNTER — Telehealth: Payer: Self-pay | Admitting: Nurse Practitioner

## 2020-08-21 ENCOUNTER — Inpatient Hospital Stay: Payer: Medicare PPO

## 2020-08-21 ENCOUNTER — Inpatient Hospital Stay: Payer: Medicare PPO | Admitting: Nurse Practitioner

## 2020-08-21 VITALS — BP 94/44 | HR 76 | Temp 97.1°F | Resp 16 | Ht 68.0 in

## 2020-08-21 DIAGNOSIS — C8261 Cutaneous follicle center lymphoma, lymph nodes of head, face, and neck: Secondary | ICD-10-CM | POA: Diagnosis present

## 2020-08-21 DIAGNOSIS — C8267 Cutaneous follicle center lymphoma, spleen: Secondary | ICD-10-CM | POA: Diagnosis present

## 2020-08-21 DIAGNOSIS — Z95828 Presence of other vascular implants and grafts: Secondary | ICD-10-CM

## 2020-08-21 DIAGNOSIS — C8590 Non-Hodgkin lymphoma, unspecified, unspecified site: Secondary | ICD-10-CM | POA: Diagnosis not present

## 2020-08-21 DIAGNOSIS — C73 Malignant neoplasm of thyroid gland: Secondary | ICD-10-CM

## 2020-08-21 LAB — CBC WITH DIFFERENTIAL (CANCER CENTER ONLY)
Abs Immature Granulocytes: 0 10*3/uL (ref 0.00–0.07)
Basophils Absolute: 0 10*3/uL (ref 0.0–0.1)
Basophils Relative: 0 %
Eosinophils Absolute: 0 10*3/uL (ref 0.0–0.5)
Eosinophils Relative: 0 %
HCT: 31.9 % — ABNORMAL LOW (ref 36.0–46.0)
Hemoglobin: 10.9 g/dL — ABNORMAL LOW (ref 12.0–15.0)
Lymphocytes Relative: 8 %
Lymphs Abs: 0.9 10*3/uL (ref 0.7–4.0)
MCH: 34.2 pg — ABNORMAL HIGH (ref 26.0–34.0)
MCHC: 34.2 g/dL (ref 30.0–36.0)
MCV: 100 fL (ref 80.0–100.0)
Monocytes Absolute: 0.4 10*3/uL (ref 0.1–1.0)
Monocytes Relative: 4 %
Neutro Abs: 9.5 10*3/uL — ABNORMAL HIGH (ref 1.7–7.7)
Neutrophils Relative %: 88 %
Platelet Count: 305 10*3/uL (ref 150–400)
RBC: 3.19 MIL/uL — ABNORMAL LOW (ref 3.87–5.11)
RDW: 14.6 % (ref 11.5–15.5)
WBC Count: 10.8 10*3/uL — ABNORMAL HIGH (ref 4.0–10.5)
nRBC: 0 % (ref 0.0–0.2)

## 2020-08-21 LAB — CMP (CANCER CENTER ONLY)
ALT: 26 U/L (ref 0–44)
AST: 33 U/L (ref 15–41)
Albumin: 2.6 g/dL — ABNORMAL LOW (ref 3.5–5.0)
Alkaline Phosphatase: 131 U/L — ABNORMAL HIGH (ref 38–126)
Anion gap: 6 (ref 5–15)
BUN: 25 mg/dL — ABNORMAL HIGH (ref 8–23)
CO2: 25 mmol/L (ref 22–32)
Calcium: 7.9 mg/dL — ABNORMAL LOW (ref 8.9–10.3)
Chloride: 102 mmol/L (ref 98–111)
Creatinine: 0.84 mg/dL (ref 0.44–1.00)
GFR, Est AFR Am: >60 mL/min (ref >60)
GFR, Estimated: >60 mL/min (ref >60)
Glucose, Bld: 140 mg/dL — ABNORMAL HIGH (ref 70–99)
Potassium: 4.3 mmol/L (ref 3.5–5.1)
Sodium: 133 mmol/L — ABNORMAL LOW (ref 135–145)
Total Bilirubin: 0.7 mg/dL (ref 0.3–1.2)
Total Protein: 5.2 g/dL — ABNORMAL LOW (ref 6.5–8.1)

## 2020-08-21 MED ORDER — SODIUM CHLORIDE 0.9% FLUSH
10.0000 mL | Freq: Once | INTRAVENOUS | Status: AC
  Administered 2020-08-21: 10 mL
  Filled 2020-08-21: qty 10

## 2020-08-21 MED ORDER — LORAZEPAM 0.5 MG PO TABS: 0.2500 mg | ORAL_TABLET | Freq: Two times a day (BID) | ORAL | 0 refills | Status: AC | PRN

## 2020-08-21 MED ORDER — HEPARIN SOD (PORK) LOCK FLUSH 100 UNIT/ML IV SOLN
500.0000 [IU] | Freq: Once | INTRAVENOUS | Status: AC
  Administered 2020-08-21: 500 [IU]
  Filled 2020-08-21: qty 5

## 2020-08-21 NOTE — Telephone Encounter (Signed)
Scheduled per los. Gave avs and calendar  

## 2020-08-21 NOTE — Progress Notes (Addendum)
[facility] OFFICE PROGRESS NOTE   Diagnosis: Non-Hodgkin's lymphoma  INTERVAL HISTORY:   Ms. Blevins returns as scheduled.  She discontinued acalabrutinib about a week ago.  She reports feeling better overall with less nausea.  She continues to have loose stools.  She began prednisone after her last visit.  No significant improvement in appetite.  She remains weak.  No fevers or sweats.  She would like to have a medication for anxiety.  Objective:  Vital signs in last 24 hours:  Blood pressure (!) 94/44, pulse 76, temperature (!) 97.1 F (36.2 C), temperature source Tympanic, resp. rate 16, height 5\' 8"  (1.727 m), SpO2 98 %.    Lymphatics: Small left cervical and left axillary lymph nodes. Resp: Breath sounds diminished right lower lung field.  No respiratory distress. Cardio: Regular rate and rhythm. GI: No hepatosplenomegaly. Vascular: No leg edema. Neuro: Alert and oriented. Skin: Ecchymoses scattered over the forearms. Port-A-Cath without erythema.   Lab Results:  Lab Results  Component Value Date   WBC 10.8 (H) 08/21/2020   HGB 10.9 (L) 08/21/2020   HCT 31.9 (L) 08/21/2020   MCV 100.0 08/21/2020   PLT 305 08/21/2020   NEUTROABS PENDING 08/21/2020    Imaging:  No results found.  Medications: I have reviewed the patient's current medications.  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 Status post weekly Rituxan x4 03/01/2012 through 03/22/2012. She completed 4 "maintenance" doses of Rituxan, last on 12/19/2012. A restaging CT on 02/09/2013 showed no evidence of lymphoma.   Lymph node lateral to the thyroid bed on a neck ultrasound 02/21/2014, status post an FNA biopsy concerning for a lymphoproliferative disorder.  PET scan 09/28/2016 with active lymphoma within the neck, chest, abdomen, pelvis; splenic enlargement and  hypermetabolism suspicious for splenic involvement.  Initiation of Rituxan weekly 4 09/29/2016  Initiation of maintenance Rituxan on a 3 month schedule 12/23/2016; final Rituxan 08/31/2018  Thyroid ultrasound 02/07/2019-left cervical lymphadenopathy  PET scan 03/08/2019-extensive recurrent hypermetabolic lymphoma involving the neck, chest, abdomen and pelvis.  03/16/2019 left cervical lymph node biopsy-features consistent with previously diagnosed non-Hodgkin's B-cell lymphoma, phenotypically consistent with marginal zone lymphoma. Flow cytometry with lambda restricted B-cell population without expression of CD5 or CD10 comprising 87% of all lymphocytes.  Cycle 1 bendamustine/Rituxan 03/22/2019  Excision deep left axillary lymph nodes 05/04/2019-non-Hodgkin's B-cell lymphoma with differential including a marginal zone lymphoma and atypical small lymphocytic lymphoma. Flow cytometry with monoclonal B-cell population without expression of CD5 or CD10, comprises 96% of all lymphocytes.  Cycle 1 CHOP/Rituxan 05/18/2019  Cycle 2 CHOP/Rituxan 06/08/2019  CTs 06/15/2019-partial improvement in diffuse adenopathy, stable mild splenomegaly  Cycle 1 Revlimid/rituximab 07/19/2019 (Revlimid start 07/20/2019)  Cycle 2 Revlimid/rituximab 08/16/2019 (Revlimid placed on hold 08/30/2019 due to neutropenia)  Cycle 3 of Revlimid/rituximab 09/13/2019 (Revlimid schedule changed to 14 days on/14 days off)  Cycle 4 Revlimid/rituximab 10/10/2019  Cycle 5 Revlimid/rituximab 11/07/2019  Cycle 6 Revlimid/rituximab 12/07/2019  CTs 12/28/2019-diffuse lymphadenopathy-slightly increased compared to 06/15/2019  Cycle 7 Revlimid/rituximab 01/04/2020  Cycle 8 Revlimid/Rituxan 02/01/2020  Cycle 9 Revlimid/Rituxan 03/05/2020  Cycle 10 Revlimid/Rituxan 04/02/2020  Cycle 11 Revlimid/Rituxan 04/30/2020  CT 05/22/2020-mild increase in left supraclavicular adenopathy, mild increase in chest, retroperitoneal, and pelvic  adenopathy. Stable mild splenomegaly.  Acalabrutinib started 06/19/2020  Acalabrutinib discontinued on hospital admission 06/23/2020 secondary to nausea and diarrhea  acalabrutinib resumed 06/26/2020  CT abdomen/pelvis 06/23/2020-moderate left and trace right pleural effusions, new diffuse mesenteric edema  and ascites, stable retroperitoneal and iliac adenopathy, mild splenomegaly  Acalabrutinib discontinued 07/02/2020 secondary to altered mental status  Acalabrutinib resumed 07/17/2020, discontinued around 08/12/2020 per patient  2. Stage IV (T1bN1b M0) papillary thyroid cancer, status post a thyroidectomy with reimplantation of the left superior parathyroid gland on 05/23/2012, status post radioactive iodine therapy, followed by Dr. Buddy Duty.  3. Stage II (T3 N0) colon cancer, status post a right colectomy 10/19/2011, last colonoscopy April 2015-sigmoid adenoma removed.  4. History of a pulmonary embolism December 2012.  5. History of Atrial fibrillation 6. Iron deficiency anemia-new 03/18/2014. Hemoccult positive stool. The anemia corrected with iron. No longer taking iron.  Status post an upper endoscopy and colonoscopy by Dr. Carlean Purl April 2015 with no bleeding source identified, benign adenoma removed from the sigmoid colon. 7. Report of an upper gastric intestinal bleed fall 2016-managed in [place]. [name] 8. Left knee replacement May 2017, repeat left knee surgery May 2018 9. Pruritic rash 07/22/2016 10. Nausea and diarrhea 04/02/2019-stool negative for C. difficile toxin 11. 06/18/2019 [facility] admission for symptomatic anemia. 12. Admission 06/23/2020 with nausea and diarrhea 13. Respiratory failure, bilateral pleural effusions  Right thoracentesis 07/04/2020-culture and cytology negative  CT chest 07/07/2020-right and left pleural effusions, bilateral basilar atelectasis, interlobular septal thickening in the right lung-asymmetric edema? Improved adenopathy  Right thoracentesis  07/08/2020-flow cytometry with a small clonal B-cell population  Right thoracentesis 07/17/2020-flow cytometry and cytology pending, no result in computer when checked 08/06/2020  Disposition: Ms. Stockdale discontinued acalabrutinib a little over a week ago.  She notes that overall she feels better.  She continues to have a poor performance status.  In her current condition she is not a candidate for additional systemic therapy.  Dr. Benay Spice discussed a referral to hospice.  She declines this at present.  She would like to return for a follow-up appointment in 2 weeks to reevaluate.  She will contact the office prior to that appointment with any problems.  She requests a medication for anxiety.  Prescription for low-dose Ativan sent to her pharmacy.  Patient seen with Dr. Benay Spice.   Ned Card ANP/GNP-BC   08/21/2020  2:09 PM This was a shared visit with Ned Card.  Ms. Tout was interviewed and examined.  Her performance status has not improved on the acalabrutinib.  She decided to discontinue acalabrutinib last week.  We discussed trying another salvage systemic therapy agent versus hospice care.  Her performance status is too poor for treatment at present.  She would like to continue physical therapy and return for reassessment in 2 weeks.   Julieanne Manson, MD

## 2020-08-22 LAB — LACTATE DEHYDROGENASE: LDH: 308 U/L — ABNORMAL HIGH (ref 98–192)

## 2020-08-29 ENCOUNTER — Telehealth: Payer: Self-pay | Admitting: *Deleted

## 2020-08-29 NOTE — Telephone Encounter (Signed)
VM from [place] w/Tracy Mclaughlin in [place]: Just visited today and she is in the bed 24/7 and not eating. Barely takes fluids. Looks dehydrated, pale and very weak. She still wants PT/OT, but they are not able to do much with her. Discussed Hospice, but she wishes to wait till her f/u visit on 9/16 to discuss w/Dr. Benay Spice before this referral is done. She is leaning towards wanting to purse Hospice. Would be [facility] and she can assist with this referral.

## 2020-09-01 ENCOUNTER — Telehealth: Payer: Self-pay | Admitting: *Deleted

## 2020-09-01 NOTE — Telephone Encounter (Addendum)
Husband reports patient has now requested full hospice in the home. He has called [facility] to let them know. Asking how this impacts the appointment on Thursday, 09/04/20. Per Dr. Benay Spice: Not necessary to come if she does not wish to. He will be attending w/Hospice. [place] and spoke w/April. Requesting records be faxed to [phone] and this was completed. Left VM for husband that the appointment for Thursday is still on schedule if she wishes to come in. If she is not able, just call and we can cancel. Hospice can keep up with flushing her port in the home.

## 2020-09-03 ENCOUNTER — Telehealth: Payer: Self-pay | Admitting: *Deleted

## 2020-09-03 NOTE — Telephone Encounter (Signed)
Called husband to f/u on if patient will be feeling up to coming in tomorrow. He reports she is pretty much bedbound and will not be able to make the trip.

## 2020-09-04 ENCOUNTER — Other Ambulatory Visit: Payer: Medicare PPO

## 2020-09-04 ENCOUNTER — Ambulatory Visit: Payer: Medicare PPO | Admitting: Oncology

## 2020-09-18 ENCOUNTER — Telehealth: Payer: Self-pay | Admitting: *Deleted

## 2020-09-18 NOTE — Telephone Encounter (Signed)
Husband called to report that Tracy Mclaughlin died at home on Oct 09, 2020 in the late afternoon. Thanks Dr. Benay Spice for his care of her.

## 2020-09-19 DEATH — deceased

## 2020-10-16 IMAGING — PT NUCLEAR MEDICINE PET IMAGE RESTAGING (PS) SKULL BASE TO THIGH
8 series · 16 of 16 positions shown · non-contrast
Comparison: PET-CT 09/28/2016

CLINICAL DATA: Subsequent treatment strategy for non-Hodgkin's
lymphoma.

EXAM:
NUCLEAR MEDICINE PET SKULL BASE TO THIGH
TECHNIQUE: 12.1 mCi F-18 FDG was injected intravenously. Full-ring PET imaging
was performed from the skull base to thigh after the radiotracer. CT
data was obtained and used for attenuation correction and anatomic
localization.
Fasting blood glucose: 104 mg/dl

[Series 3: pet hn_sk_thigh ac · axial · 5.0mm · 4.07mm/px · z∈[-741,+199]mm · 3 of 236 slices shown]
[im 1/236]
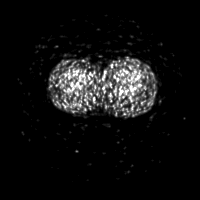
[im 118/236]
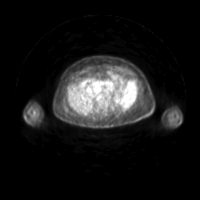
[im 236/236]
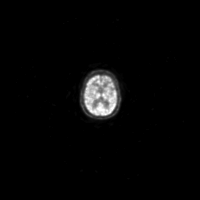

[Series 4: ct hn_sk_th 5.0 hd_fov · axial · 1.07mm/px · z∈[-741,+199]mm · 3 of 236 slices shown]
[im 1/236  soft-tissue]
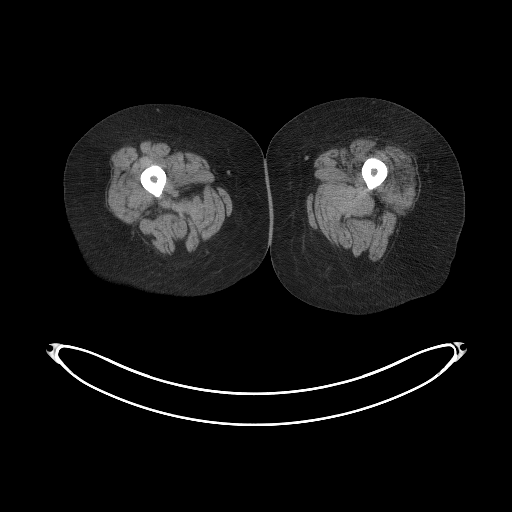
[im 118/236  soft-tissue]
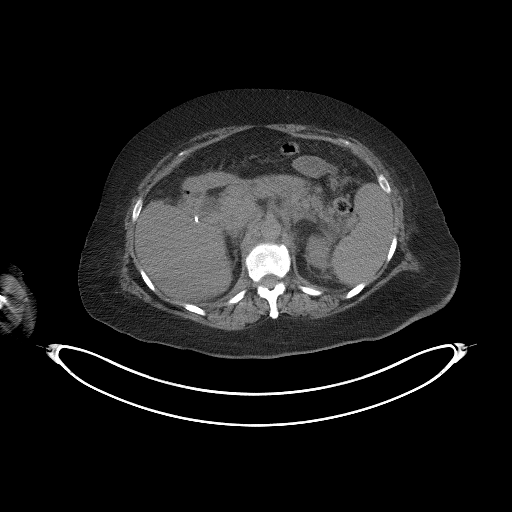
[im 236/236  soft-tissue]
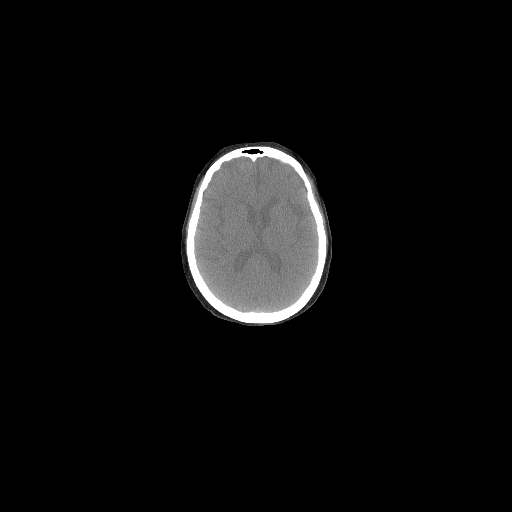

[Series 5: pet hn_sk_thigh nac · axial · 5.0mm · 4.07mm/px · z∈[-741,+199]mm · 3 of 236 slices shown]
[im 1/236]
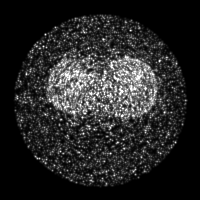
[im 118/236]
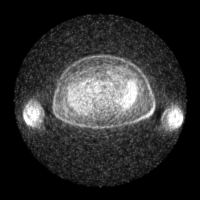
[im 236/236]
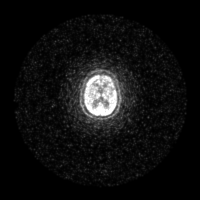

[Series 8: ct hn_sk_th 5.0 b70f (id)_bone · axial · 0.70mm/px · 1 of 64 slices shown]
[im 1/64  soft-tissue]
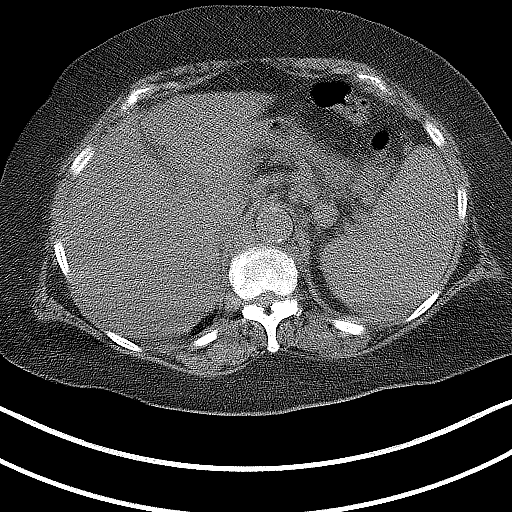

[Series 603: mip range · coronal · 1.95mm/px · 1 of 32 slices shown]
[im 1/32]
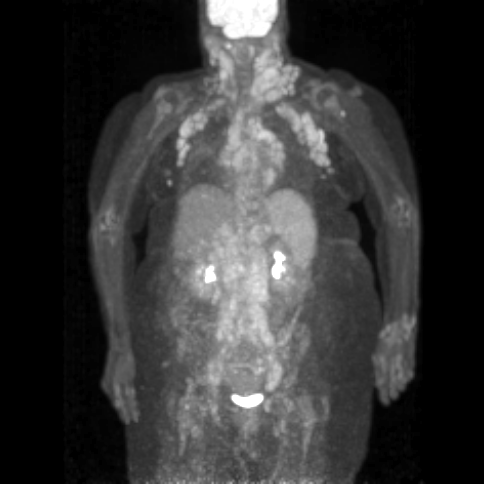

[Series 604: range-ct hn_sk_th 5.0 hd_fov-cor-<alpha range> · 1 of 84 slices shown]
[im 1/84]
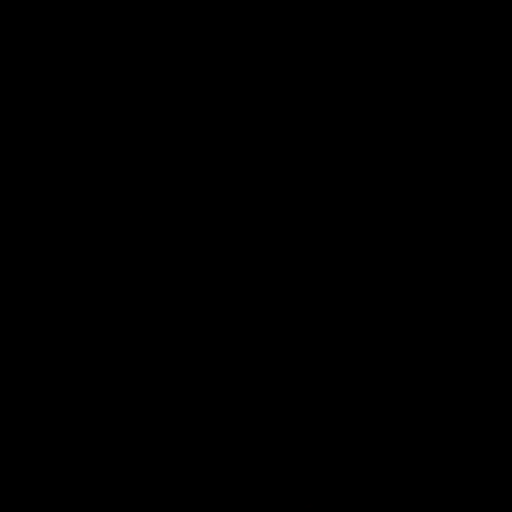

[Series 605: range-ct hn_sk_th 5.0 hd_fov-tra-<alpha range> · 3 of 183 slices shown]
[im 1/183]
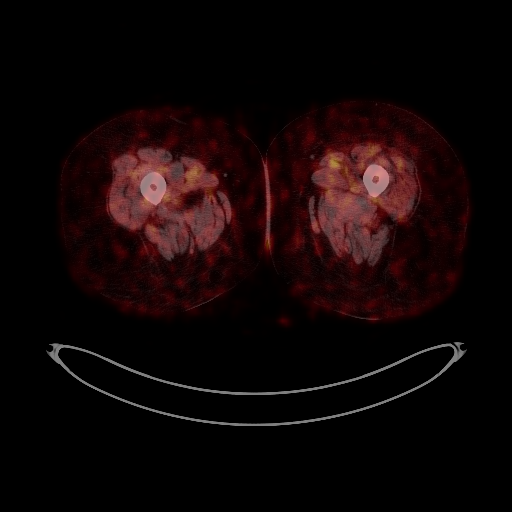
[im 92/183]
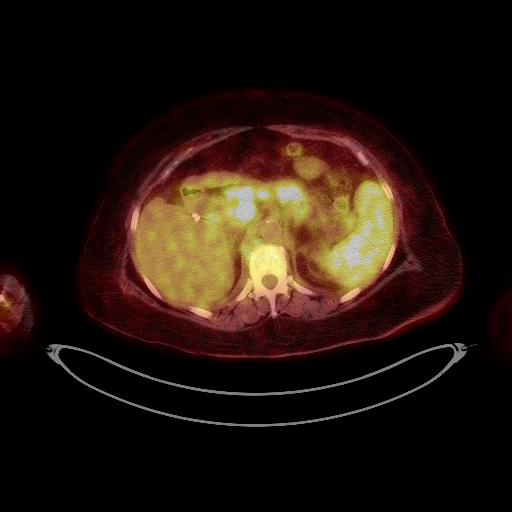
[im 183/183]
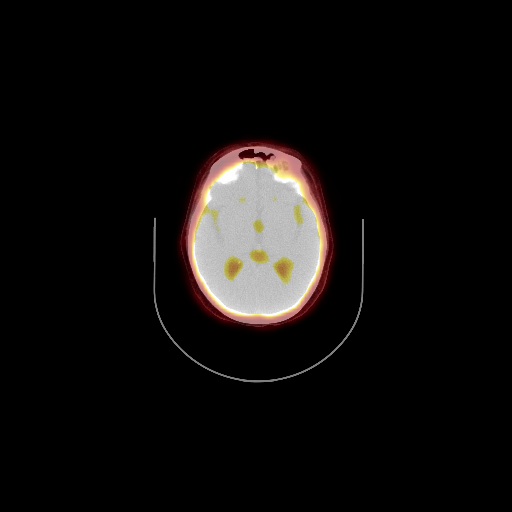

[Series 1151: results mm oncology reading · 5.0mm · 0.50mm/px · 1 of 12 slices shown]
[im 1/12]
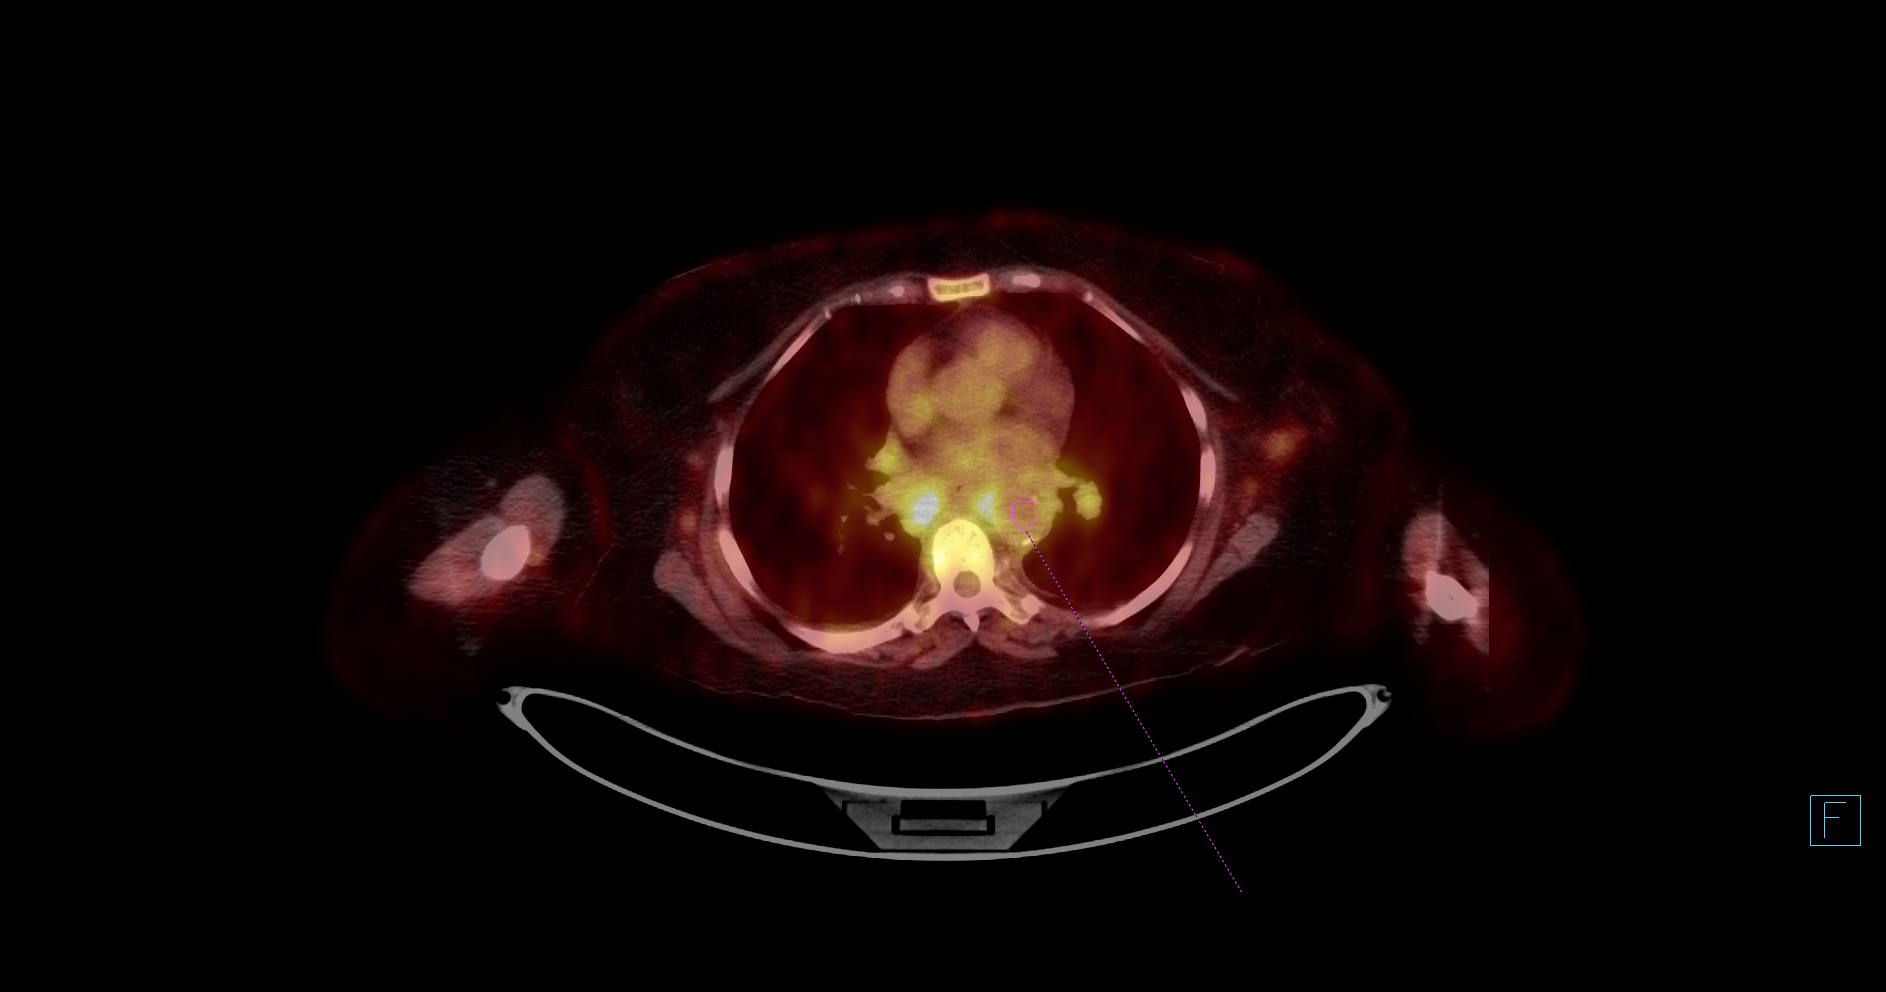

[16 of 16 positions shown; findings below may reference images not displayed]

FINDINGS: Mediastinal blood pool activity: SUV max

NECK: Diffuse and extensive adenopathy throughout all stations of
the neck.

mm and the SUV max is 7.47.

Index level 2 lymph node on the right side on image number 33
measures 20 mm and the SUV max is 6.71.

Level 3 lymph node on the left side on image number 36 measures 25
mm and the SUV max is 8.48.

Incidental CT findings: none

CHEST: Extensive supraclavicular, axillary, mediastinal and hilar
lymphadenopathy.

Left-sided supraclavicular lymph node on image number 47 has an SUV
max of [REDACTED] mm right axillary node on image number 65 has an SUV max of [REDACTED] mm prevascular lymph node on image number 67 has an SUV max of
8.79

19.5 mm right-sided subcarinal lymph node on image number 81 has an
SUV max of

No worrisome lung lesions.

Incidental CT findings: none

ABDOMEN/PELVIS: Diffuse bulky mesenteric and retroperitoneal
lymphadenopathy.

Large nodal mass in the retroperitoneum measures approximately 9.5 x
5.0 cm on image number 134 and the SUV max is 9.05.

Bilateral pelvic adenopathy.

22 mm left operator lymph node on image number 185 has an SUV max of
7.55

Bilateral axillary lymphadenopathy.

Index right inguinal lymph node on image number 199 measures 13.5 mm
and the SUV max is 5.38.

The spleen is mildly enlarged but not nearly is largely a was back
in 2942. Numerous small hypermetabolic lesions throughout the spleen
with SUV max of 6.24.

Incidental CT findings: none

SKELETON: No obvious osseous involvement. There are small scattered
lesions in the subcutaneous fat overlying the lower chest areas.
These are likely lymph nodes.

Incidental CT findings: none
IMPRESSION: 1. Extensive recurrent hypermetabolic lymphoma involving the neck,
chest, abdomen and pelvis as detailed above.
2. No obvious osseous involvement.  No lung lesions.

## 2021-01-26 IMAGING — DX PORTABLE CHEST - 1 VIEW
1 series · 1 of 1 positions shown · non-contrast
Comparison: 05/15/2019

CLINICAL DATA: 84 y.o female takes chemo (splenic lymphoma) and
reports for a couple weeks been fatigued, vomiting and diarrheacough

EXAM:
PORTABLE CHEST 1 VIEW

[chest ap]
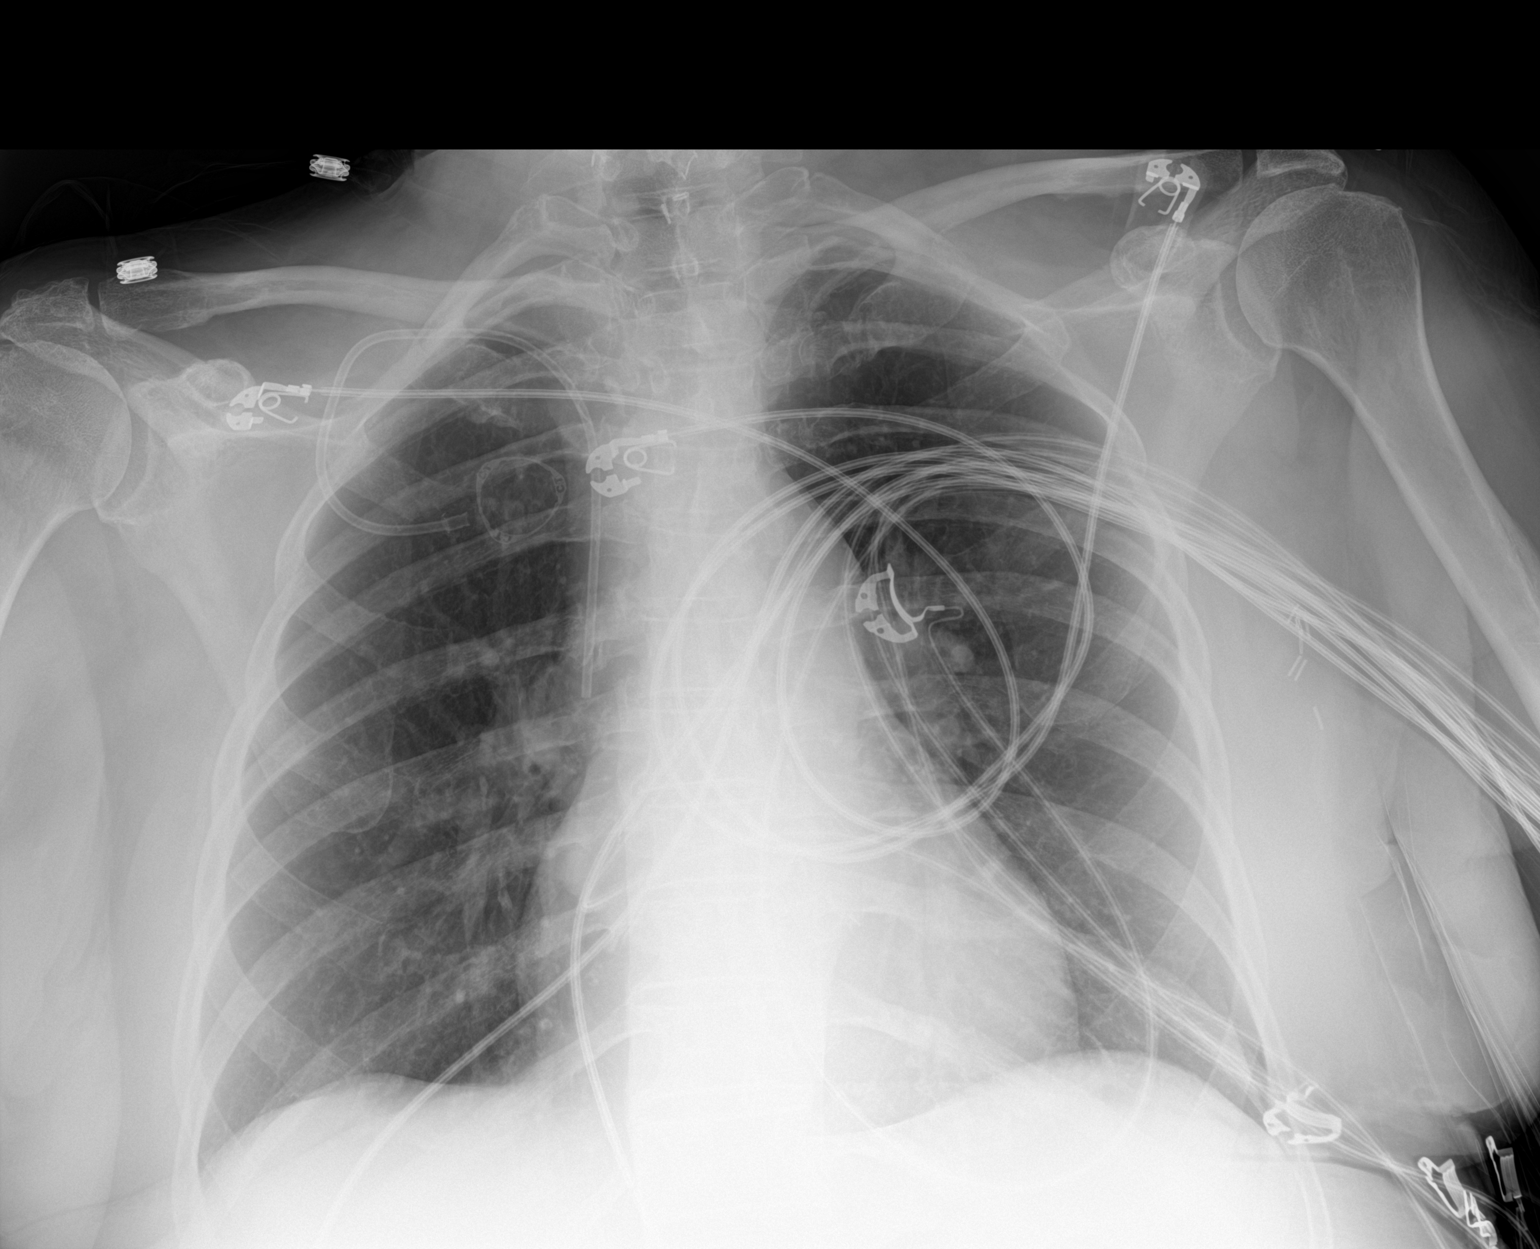

[1 of 1 positions shown; findings below may reference images not displayed]

FINDINGS: Port in the anterior chest wall with tip in distal SVC. Normal
mediastinum and cardiac silhouette. Normal pulmonary vasculature. No
evidence of effusion, infiltrate, or pneumothorax. No acute bony
abnormality. LEFT axillary and nodal dissection clips
IMPRESSION: No acute cardiopulmonary process.
